# Patient Record
Sex: Female | Born: 1965
Health system: Southern US, Community
[De-identification: ages and names within clinical notes are randomized; demographics above are authoritative.]

## PROBLEM LIST (undated history)

## (undated) DIAGNOSIS — M545 Low back pain, unspecified: Secondary | ICD-10-CM

## (undated) DIAGNOSIS — I509 Heart failure, unspecified: Secondary | ICD-10-CM

## (undated) DIAGNOSIS — I10 Essential (primary) hypertension: Secondary | ICD-10-CM

## (undated) DIAGNOSIS — G4730 Sleep apnea, unspecified: Secondary | ICD-10-CM

## (undated) DIAGNOSIS — K219 Gastro-esophageal reflux disease without esophagitis: Secondary | ICD-10-CM

## (undated) DIAGNOSIS — M1990 Unspecified osteoarthritis, unspecified site: Secondary | ICD-10-CM

## (undated) DIAGNOSIS — E7800 Pure hypercholesterolemia, unspecified: Secondary | ICD-10-CM

## (undated) DIAGNOSIS — E1140 Type 2 diabetes mellitus with diabetic neuropathy, unspecified: Secondary | ICD-10-CM

## (undated) DIAGNOSIS — I429 Cardiomyopathy, unspecified: Secondary | ICD-10-CM

## (undated) DIAGNOSIS — G8929 Other chronic pain: Secondary | ICD-10-CM

## (undated) DIAGNOSIS — E119 Type 2 diabetes mellitus without complications: Secondary | ICD-10-CM

## (undated) DIAGNOSIS — Z8489 Family history of other specified conditions: Secondary | ICD-10-CM

## (undated) HISTORY — PX: JOINT REPLACEMENT: SHX530

## (undated) HISTORY — PX: CARPAL TUNNEL RELEASE: SHX101

## (undated) HISTORY — PX: TOENAIL EXCISION: SUR558

## (undated) HISTORY — PX: BACK SURGERY: SHX140

## (undated) HISTORY — PX: REPLACEMENT TOTAL KNEE BILATERAL: SUR1225

## (undated) HISTORY — PX: TONSILLECTOMY: SUR1361

---

## 1997-09-15 HISTORY — PX: LAPAROSCOPIC CHOLECYSTECTOMY: SUR755

## 1998-02-23 ENCOUNTER — Encounter: Admission: RE | Admit: 1998-02-23 | Discharge: 1998-02-23 | Payer: Self-pay | Admitting: Obstetrics & Gynecology

## 1998-05-24 ENCOUNTER — Encounter: Admission: RE | Admit: 1998-05-24 | Discharge: 1998-05-24 | Payer: Self-pay | Admitting: Obstetrics

## 1998-08-30 ENCOUNTER — Encounter: Admission: RE | Admit: 1998-08-30 | Discharge: 1998-08-30 | Payer: Self-pay | Admitting: Obstetrics

## 1998-11-16 ENCOUNTER — Encounter: Admission: RE | Admit: 1998-11-16 | Discharge: 1998-11-16 | Payer: Self-pay | Admitting: Obstetrics & Gynecology

## 1999-02-14 ENCOUNTER — Encounter: Admission: RE | Admit: 1999-02-14 | Discharge: 1999-02-14 | Payer: Self-pay | Admitting: Obstetrics

## 1999-04-18 ENCOUNTER — Encounter: Admission: RE | Admit: 1999-04-18 | Discharge: 1999-04-18 | Payer: Self-pay | Admitting: Obstetrics

## 1999-05-09 ENCOUNTER — Encounter: Admission: RE | Admit: 1999-05-09 | Discharge: 1999-05-09 | Payer: Self-pay | Admitting: Obstetrics

## 1999-05-16 ENCOUNTER — Ambulatory Visit (HOSPITAL_BASED_OUTPATIENT_CLINIC_OR_DEPARTMENT_OTHER): Admission: RE | Admit: 1999-05-16 | Discharge: 1999-05-16 | Payer: Self-pay | Admitting: Orthopedic Surgery

## 1999-08-01 ENCOUNTER — Encounter: Admission: RE | Admit: 1999-08-01 | Discharge: 1999-08-01 | Payer: Self-pay | Admitting: Obstetrics

## 1999-10-10 ENCOUNTER — Encounter: Admission: RE | Admit: 1999-10-10 | Discharge: 1999-10-10 | Payer: Self-pay | Admitting: Nephrology

## 1999-10-10 ENCOUNTER — Encounter: Payer: Self-pay | Admitting: Nephrology

## 1999-10-16 ENCOUNTER — Encounter: Admission: RE | Admit: 1999-10-16 | Discharge: 1999-10-16 | Payer: Self-pay | Admitting: Family Medicine

## 1999-10-23 ENCOUNTER — Encounter: Admission: RE | Admit: 1999-10-23 | Discharge: 2000-01-21 | Payer: Self-pay | Admitting: *Deleted

## 1999-10-31 ENCOUNTER — Encounter: Admission: RE | Admit: 1999-10-31 | Discharge: 1999-10-31 | Payer: Self-pay | Admitting: Obstetrics

## 2000-02-20 ENCOUNTER — Encounter: Admission: RE | Admit: 2000-02-20 | Discharge: 2000-02-20 | Payer: Self-pay | Admitting: Internal Medicine

## 2000-03-05 ENCOUNTER — Encounter: Admission: RE | Admit: 2000-03-05 | Discharge: 2000-03-05 | Payer: Self-pay | Admitting: Obstetrics

## 2000-05-28 ENCOUNTER — Encounter: Admission: RE | Admit: 2000-05-28 | Discharge: 2000-05-28 | Payer: Self-pay | Admitting: Obstetrics

## 2000-08-27 ENCOUNTER — Encounter: Admission: RE | Admit: 2000-08-27 | Discharge: 2000-08-27 | Payer: Self-pay | Admitting: Obstetrics

## 2001-04-02 ENCOUNTER — Ambulatory Visit (HOSPITAL_BASED_OUTPATIENT_CLINIC_OR_DEPARTMENT_OTHER): Admission: RE | Admit: 2001-04-02 | Discharge: 2001-04-02 | Payer: Self-pay | Admitting: Family Medicine

## 2001-07-13 ENCOUNTER — Ambulatory Visit (HOSPITAL_COMMUNITY): Admission: RE | Admit: 2001-07-13 | Discharge: 2001-07-13 | Payer: Self-pay | Admitting: Family Medicine

## 2001-07-13 ENCOUNTER — Encounter: Payer: Self-pay | Admitting: Family Medicine

## 2002-01-13 ENCOUNTER — Encounter (INDEPENDENT_AMBULATORY_CARE_PROVIDER_SITE_OTHER): Payer: Self-pay | Admitting: *Deleted

## 2002-01-19 ENCOUNTER — Other Ambulatory Visit: Admission: RE | Admit: 2002-01-19 | Discharge: 2002-01-19 | Payer: Self-pay | Admitting: Family Medicine

## 2002-01-19 ENCOUNTER — Encounter: Admission: RE | Admit: 2002-01-19 | Discharge: 2002-01-19 | Payer: Self-pay | Admitting: Family Medicine

## 2002-01-27 ENCOUNTER — Encounter: Admission: RE | Admit: 2002-01-27 | Discharge: 2002-01-27 | Payer: Self-pay | Admitting: Family Medicine

## 2002-02-01 ENCOUNTER — Encounter: Admission: RE | Admit: 2002-02-01 | Discharge: 2002-02-01 | Payer: Self-pay | Admitting: Family Medicine

## 2002-02-23 ENCOUNTER — Encounter: Admission: RE | Admit: 2002-02-23 | Discharge: 2002-02-23 | Payer: Self-pay | Admitting: Family Medicine

## 2002-04-27 ENCOUNTER — Encounter: Admission: RE | Admit: 2002-04-27 | Discharge: 2002-04-27 | Payer: Self-pay | Admitting: Family Medicine

## 2002-05-05 ENCOUNTER — Encounter: Admission: RE | Admit: 2002-05-05 | Discharge: 2002-05-05 | Payer: Self-pay | Admitting: Family Medicine

## 2002-05-05 ENCOUNTER — Ambulatory Visit (HOSPITAL_COMMUNITY): Admission: RE | Admit: 2002-05-05 | Discharge: 2002-05-05 | Payer: Self-pay | Admitting: Family Medicine

## 2002-05-12 ENCOUNTER — Encounter: Admission: RE | Admit: 2002-05-12 | Discharge: 2002-05-12 | Payer: Self-pay | Admitting: Family Medicine

## 2002-06-09 ENCOUNTER — Encounter: Admission: RE | Admit: 2002-06-09 | Discharge: 2002-06-09 | Payer: Self-pay | Admitting: Family Medicine

## 2002-06-10 ENCOUNTER — Ambulatory Visit (HOSPITAL_COMMUNITY): Admission: RE | Admit: 2002-06-10 | Discharge: 2002-06-10 | Payer: Self-pay | Admitting: *Deleted

## 2002-07-04 ENCOUNTER — Encounter: Admission: RE | Admit: 2002-07-04 | Discharge: 2002-10-02 | Payer: Self-pay | Admitting: Sports Medicine

## 2002-07-21 ENCOUNTER — Encounter: Admission: RE | Admit: 2002-07-21 | Discharge: 2002-07-21 | Payer: Self-pay | Admitting: Family Medicine

## 2002-08-16 ENCOUNTER — Encounter: Admission: RE | Admit: 2002-08-16 | Discharge: 2002-08-16 | Payer: Self-pay | Admitting: Family Medicine

## 2002-09-26 ENCOUNTER — Encounter: Admission: RE | Admit: 2002-09-26 | Discharge: 2002-09-26 | Payer: Self-pay | Admitting: Family Medicine

## 2002-10-19 ENCOUNTER — Encounter: Admission: RE | Admit: 2002-10-19 | Discharge: 2002-10-19 | Payer: Self-pay | Admitting: Family Medicine

## 2003-01-12 ENCOUNTER — Encounter: Admission: RE | Admit: 2003-01-12 | Discharge: 2003-01-12 | Payer: Self-pay | Admitting: Family Medicine

## 2003-02-10 ENCOUNTER — Encounter: Admission: RE | Admit: 2003-02-10 | Discharge: 2003-02-10 | Payer: Self-pay | Admitting: Family Medicine

## 2003-04-10 ENCOUNTER — Encounter: Admission: RE | Admit: 2003-04-10 | Discharge: 2003-04-10 | Payer: Self-pay | Admitting: Family Medicine

## 2003-05-01 ENCOUNTER — Encounter: Admission: RE | Admit: 2003-05-01 | Discharge: 2003-05-01 | Payer: Self-pay | Admitting: Sports Medicine

## 2003-06-01 ENCOUNTER — Encounter: Admission: RE | Admit: 2003-06-01 | Discharge: 2003-06-01 | Payer: Self-pay | Admitting: Family Medicine

## 2003-06-29 ENCOUNTER — Encounter: Admission: RE | Admit: 2003-06-29 | Discharge: 2003-06-29 | Payer: Self-pay | Admitting: Family Medicine

## 2004-01-23 ENCOUNTER — Encounter: Admission: RE | Admit: 2004-01-23 | Discharge: 2004-01-23 | Payer: Self-pay | Admitting: Family Medicine

## 2004-02-06 ENCOUNTER — Encounter: Admission: RE | Admit: 2004-02-06 | Discharge: 2004-02-06 | Payer: Self-pay | Admitting: Sports Medicine

## 2004-02-16 ENCOUNTER — Encounter: Admission: RE | Admit: 2004-02-16 | Discharge: 2004-02-16 | Payer: Self-pay | Admitting: Family Medicine

## 2004-03-04 ENCOUNTER — Ambulatory Visit (HOSPITAL_BASED_OUTPATIENT_CLINIC_OR_DEPARTMENT_OTHER): Admission: RE | Admit: 2004-03-04 | Discharge: 2004-03-04 | Payer: Self-pay | Admitting: Sports Medicine

## 2004-03-08 ENCOUNTER — Encounter: Admission: RE | Admit: 2004-03-08 | Discharge: 2004-03-08 | Payer: Self-pay | Admitting: Cardiology

## 2004-05-23 ENCOUNTER — Ambulatory Visit: Payer: Self-pay | Admitting: Family Medicine

## 2004-05-30 ENCOUNTER — Ambulatory Visit: Payer: Self-pay | Admitting: Family Medicine

## 2004-09-12 ENCOUNTER — Ambulatory Visit: Payer: Self-pay | Admitting: Family Medicine

## 2005-01-15 ENCOUNTER — Ambulatory Visit: Payer: Self-pay | Admitting: Family Medicine

## 2005-01-29 ENCOUNTER — Ambulatory Visit: Payer: Self-pay | Admitting: Family Medicine

## 2005-02-25 ENCOUNTER — Encounter
Admission: RE | Admit: 2005-02-25 | Discharge: 2005-05-26 | Payer: Self-pay | Admitting: Physical Medicine and Rehabilitation

## 2005-02-25 ENCOUNTER — Ambulatory Visit: Payer: Self-pay | Admitting: Physical Medicine and Rehabilitation

## 2005-03-06 ENCOUNTER — Encounter
Admission: RE | Admit: 2005-03-06 | Discharge: 2005-03-06 | Payer: Self-pay | Admitting: Physical Medicine and Rehabilitation

## 2005-06-13 ENCOUNTER — Ambulatory Visit: Payer: Self-pay | Admitting: Family Medicine

## 2005-06-27 ENCOUNTER — Ambulatory Visit: Payer: Self-pay | Admitting: Family Medicine

## 2005-12-30 ENCOUNTER — Emergency Department (HOSPITAL_COMMUNITY): Admission: EM | Admit: 2005-12-30 | Discharge: 2005-12-30 | Payer: Self-pay | Admitting: Family Medicine

## 2006-03-21 ENCOUNTER — Emergency Department (HOSPITAL_COMMUNITY): Admission: EM | Admit: 2006-03-21 | Discharge: 2006-03-21 | Payer: Self-pay | Admitting: Emergency Medicine

## 2006-10-05 ENCOUNTER — Encounter: Admission: RE | Admit: 2006-10-05 | Discharge: 2006-10-05 | Payer: Self-pay | Admitting: Family Medicine

## 2006-11-12 DIAGNOSIS — I10 Essential (primary) hypertension: Secondary | ICD-10-CM

## 2006-11-12 DIAGNOSIS — M1710 Unilateral primary osteoarthritis, unspecified knee: Secondary | ICD-10-CM

## 2006-11-12 DIAGNOSIS — G4730 Sleep apnea, unspecified: Secondary | ICD-10-CM | POA: Insufficient documentation

## 2006-11-12 DIAGNOSIS — F17200 Nicotine dependence, unspecified, uncomplicated: Secondary | ICD-10-CM

## 2006-11-12 DIAGNOSIS — F329 Major depressive disorder, single episode, unspecified: Secondary | ICD-10-CM

## 2006-11-12 DIAGNOSIS — L708 Other acne: Secondary | ICD-10-CM | POA: Insufficient documentation

## 2006-11-12 DIAGNOSIS — IMO0002 Reserved for concepts with insufficient information to code with codable children: Secondary | ICD-10-CM | POA: Insufficient documentation

## 2006-11-12 DIAGNOSIS — R079 Chest pain, unspecified: Secondary | ICD-10-CM

## 2006-11-12 DIAGNOSIS — K219 Gastro-esophageal reflux disease without esophagitis: Secondary | ICD-10-CM

## 2006-11-12 DIAGNOSIS — G4733 Obstructive sleep apnea (adult) (pediatric): Secondary | ICD-10-CM | POA: Insufficient documentation

## 2006-11-13 ENCOUNTER — Encounter (INDEPENDENT_AMBULATORY_CARE_PROVIDER_SITE_OTHER): Payer: Self-pay | Admitting: *Deleted

## 2007-01-18 ENCOUNTER — Encounter
Admission: RE | Admit: 2007-01-18 | Discharge: 2007-04-18 | Payer: Self-pay | Admitting: Physical Medicine and Rehabilitation

## 2007-02-26 ENCOUNTER — Ambulatory Visit: Payer: Self-pay | Admitting: Physical Medicine and Rehabilitation

## 2007-03-03 ENCOUNTER — Ambulatory Visit (HOSPITAL_COMMUNITY)
Admission: RE | Admit: 2007-03-03 | Discharge: 2007-03-03 | Payer: Self-pay | Admitting: Physical Medicine and Rehabilitation

## 2007-03-29 ENCOUNTER — Ambulatory Visit: Payer: Self-pay | Admitting: Physical Medicine and Rehabilitation

## 2007-04-28 ENCOUNTER — Ambulatory Visit: Payer: Self-pay | Admitting: Physical Medicine and Rehabilitation

## 2007-04-28 ENCOUNTER — Encounter
Admission: RE | Admit: 2007-04-28 | Discharge: 2007-07-27 | Payer: Self-pay | Admitting: Physical Medicine and Rehabilitation

## 2007-05-24 ENCOUNTER — Ambulatory Visit (HOSPITAL_COMMUNITY)
Admission: RE | Admit: 2007-05-24 | Discharge: 2007-05-24 | Payer: Self-pay | Admitting: Physical Medicine and Rehabilitation

## 2007-06-21 ENCOUNTER — Ambulatory Visit (HOSPITAL_COMMUNITY)
Admission: RE | Admit: 2007-06-21 | Discharge: 2007-06-21 | Payer: Self-pay | Admitting: Physical Medicine and Rehabilitation

## 2007-06-22 ENCOUNTER — Ambulatory Visit: Payer: Self-pay | Admitting: Physical Medicine and Rehabilitation

## 2007-07-23 ENCOUNTER — Ambulatory Visit: Payer: Self-pay | Admitting: Physical Medicine and Rehabilitation

## 2007-10-20 ENCOUNTER — Inpatient Hospital Stay (HOSPITAL_COMMUNITY): Admission: RE | Admit: 2007-10-20 | Discharge: 2007-10-25 | Payer: Self-pay | Admitting: Orthopedic Surgery

## 2008-04-13 ENCOUNTER — Inpatient Hospital Stay (HOSPITAL_COMMUNITY): Admission: RE | Admit: 2008-04-13 | Discharge: 2008-04-22 | Payer: Self-pay | Admitting: Orthopedic Surgery

## 2008-06-13 ENCOUNTER — Encounter: Admission: RE | Admit: 2008-06-13 | Discharge: 2008-06-13 | Payer: Self-pay | Admitting: Orthopedic Surgery

## 2008-08-23 ENCOUNTER — Emergency Department (HOSPITAL_COMMUNITY): Admission: EM | Admit: 2008-08-23 | Discharge: 2008-08-23 | Payer: Self-pay | Admitting: Emergency Medicine

## 2008-10-01 ENCOUNTER — Observation Stay (HOSPITAL_COMMUNITY): Admission: EM | Admit: 2008-10-01 | Discharge: 2008-10-03 | Payer: Self-pay | Admitting: Emergency Medicine

## 2008-10-01 ENCOUNTER — Ambulatory Visit: Payer: Self-pay | Admitting: Cardiology

## 2010-01-01 ENCOUNTER — Emergency Department (HOSPITAL_COMMUNITY): Admission: EM | Admit: 2010-01-01 | Discharge: 2010-01-01 | Payer: Self-pay | Admitting: Emergency Medicine

## 2010-02-03 ENCOUNTER — Encounter: Admission: RE | Admit: 2010-02-03 | Discharge: 2010-02-03 | Payer: Self-pay | Admitting: Neurosurgery

## 2010-02-20 ENCOUNTER — Emergency Department (HOSPITAL_COMMUNITY): Admission: EM | Admit: 2010-02-20 | Discharge: 2010-02-21 | Payer: Self-pay | Admitting: Emergency Medicine

## 2010-03-03 ENCOUNTER — Ambulatory Visit: Payer: Self-pay | Admitting: Pulmonary Disease

## 2010-03-03 ENCOUNTER — Inpatient Hospital Stay (HOSPITAL_COMMUNITY): Admission: EM | Admit: 2010-03-03 | Discharge: 2010-03-16 | Payer: Self-pay | Admitting: Emergency Medicine

## 2010-03-06 ENCOUNTER — Encounter: Payer: Self-pay | Admitting: Neurosurgery

## 2010-03-06 HISTORY — PX: ANTERIOR CERVICAL DECOMP/DISCECTOMY FUSION: SHX1161

## 2010-04-10 ENCOUNTER — Ambulatory Visit (HOSPITAL_COMMUNITY)
Admission: RE | Admit: 2010-04-10 | Discharge: 2010-04-11 | Payer: Self-pay | Source: Home / Self Care | Admitting: Neurosurgery

## 2010-04-10 HISTORY — PX: HARDWARE REVISION: SHX5845

## 2010-08-22 ENCOUNTER — Encounter (HOSPITAL_BASED_OUTPATIENT_CLINIC_OR_DEPARTMENT_OTHER): Admission: RE | Admit: 2010-08-22 | Payer: Self-pay | Admitting: Internal Medicine

## 2010-09-15 HISTORY — PX: LUMBAR LAMINECTOMY/DECOMPRESSION MICRODISCECTOMY: SHX5026

## 2010-10-04 ENCOUNTER — Inpatient Hospital Stay (HOSPITAL_COMMUNITY)
Admission: RE | Admit: 2010-10-04 | Discharge: 2010-10-10 | Payer: Self-pay | Source: Home / Self Care | Attending: Neurosurgery | Admitting: Neurosurgery

## 2010-10-05 NOTE — Op Note (Signed)
NAMEMARKELL, NASEEM NO.:  [id number]  MEDICAL RECORD NO.:  [id number]          PATIENT TYPE:  INP  LOCATION:  3111                         FACILITY:  [facility]  PHYSICIAN:  Ashok Pall, M.D.     DATE OF BIRTH:  1966/06/13  DATE OF PROCEDURE:  10/04/2010 DATE OF DISCHARGE:                              OPERATIVE REPORT   PREOPERATIVE DIAGNOSES: 1. Lumbar stenosis, right L3-L4, L4-L5. 2. Lumbar displaced disk, left L5-S1.  POSTOPERATIVE DIAGNOSES: 1. Lumbar stenosis, right L3-L4, L4-L5. 2. Lumbar displaced disk, left L5-S1.  PROCEDURES: 1. Right L3-L4 semi-hemilaminectomy and foraminotomy. 2. Decompression of the L3-L4 roots, additional level L4-L5 on the     right side with microdissection. 3. Left L5-S1 semi-hemilaminectomy and diskectomy with     microdissection.  COMPLICATIONS:  None.  SURGEON:  Ashok Pall, MD  ASSISTANT:  Ophelia Charter, MD  ANESTHESIA:  General endotracheal.  INDICATIONS:  Mrs. Blakenship is a woman who is morbidly obese and presented with significant pain in the lower back late spring of last year. However, on my exam, I noticed that she was floridly myelopathic.  That led to her undergoing a cervical corpectomy this summer.  After she improved, she again mentioned that her back and lower extremities were painful.  MRI showed significant foraminal narrowing on the right side at L3-L4 and at L4-L5 and a fairly large displaced disk on the left L5- S1.  We spoke at length about the procedure, expectations, the fact that this was not going to cure her of her pain, and the fact that her morbid obesity would make things somewhat more difficult.  She understood and still wished to proceed.  OPERATIVE NOTE:  [name] was brought to the operating room, intubated, and placed under general anesthetic.  We initially had a Jackson table secondary to her weight and flipped her onto the [place] table.  She was too wide for the bed.  We  then flipped her back onto the stretcher and then used a wider table which accommodated her without difficulty.  She was positioned and all pressure points were properly padded.  Her back was prepped and she was draped in a sterile fashion.  I infiltrated 30 mL of lidocaine with 1:200,000 strength epinephrine.  I opened the skin with a #10 blade and took this down to the thoracolumbar fascia.  On the right side, I then exposed what were confirmed to be the laminae of L3, L4, and L5.  I performed semi-hemilaminectomies using the drill and Kerrison punch on the right side at L3-L4.  There was frankly significant overgrowth of the facet and also fairly impressive calcification and hardening of the ligamentum flavum.  I removed the ligament and was able to decompress the spinal canal and nerve roots of L3-L4 on the right side.  I did perform a medial facetectomy of the joint, it was in a horrible shape and actually believed that she had a synovial cyst which was somewhat adherent to the dura that I removed.  I then turned my attention to the L4-L5 level and again semi- hemilaminectomy of L4 and L5.  Again,  I encountered the same very hard ligamentum flavum.  I removed that, went on into the neural foramen to decompress the L5.  L4 was decompressed both above and below and the thecal sac was well decompressed and the interlaminar space between L4- L5.  I placed Gelfoam along both resection sites and went to the left side at L5-S1 exposing the interlaminar space.  I removed ligamentum flavum, exposed the thecal sac, and brought the microscope into the operative field again for microdissection.  With Dr. Arnoldo Morale assistance, we removed what was a large disk herniation at L5-S1 on the left.  This was markedly degenerated and came out in a piecemeal fashion.  I did thoroughly decompressed the S1 root and L5 root that was felt to be decompressed also.  I irrigated the wound.  I then closed the wound  in layered fashion with Dr. Arnoldo Morale assistance.  I reapproximated the thoracolumbar subcutaneous and subcuticular layers.  Dermabond was used for sterile dressing.          ______________________________ Ashok Pall, M.D.     KC/MEDQ  D:  10/04/2010  T:  10/05/2010  Job:  [id number]  Electronically Signed by Ashok Pall M.D. on 10/05/2010 10:49:08 AM

## 2010-10-06 ENCOUNTER — Encounter: Payer: Self-pay | Admitting: Orthopedic Surgery

## 2010-10-06 ENCOUNTER — Encounter: Payer: Self-pay | Admitting: Obstetrics and Gynecology

## 2010-10-06 ENCOUNTER — Encounter: Payer: Self-pay | Admitting: Cardiology

## 2010-10-07 LAB — GLUCOSE, CAPILLARY
Glucose-Capillary: 239 mg/dL — ABNORMAL HIGH (ref 70–99)
Glucose-Capillary: 258 mg/dL — ABNORMAL HIGH (ref 70–99)

## 2010-10-07 LAB — URINALYSIS, ROUTINE W REFLEX MICROSCOPIC
Hgb urine dipstick: NEGATIVE
Ketones, ur: 15 mg/dL — AB
Protein, ur: 30 mg/dL — AB
Urobilinogen, UA: 1 mg/dL (ref 0.0–1.0)

## 2010-10-07 LAB — URINE MICROSCOPIC-ADD ON

## 2010-10-07 LAB — CBC
HCT: 38.4 % (ref 36.0–46.0)
MCH: 27.4 pg (ref 26.0–34.0)
MCHC: 31.8 g/dL (ref 30.0–36.0)
MCV: 86.1 fL (ref 78.0–100.0)
RDW: 16.1 % — ABNORMAL HIGH (ref 11.5–15.5)

## 2010-10-07 LAB — SURGICAL PCR SCREEN
MRSA, PCR: NEGATIVE
Staphylococcus aureus: POSITIVE — AB

## 2010-10-09 LAB — POCT I-STAT, CHEM 8
Chloride: 100 mEq/L (ref 96–112)
Glucose, Bld: 234 mg/dL — ABNORMAL HIGH (ref 70–99)
HCT: 46 % (ref 36.0–46.0)
Potassium: 5.2 mEq/L — ABNORMAL HIGH (ref 3.5–5.1)

## 2010-11-08 NOTE — Discharge Summary (Signed)
  [name], [name] NO.:  [id number]  MEDICAL RECORD NO.:  [id number]          PATIENT TYPE:  INP  LOCATION:  3021                         FACILITY:  [facility]  PHYSICIAN:  Ashok Pall, M.D.     DATE OF BIRTH:  Feb 12, 1966  DATE OF ADMISSION:  10/04/2010 DATE OF DISCHARGE:  10/10/2010                              DISCHARGE SUMMARY   ADMITTING DIAGNOSES:  Lumbar stenosis at L3-4, L4-5, displaced disk at L5-S1, left.  DISCHARGE DIAGNOSES:  Lumbar stenosis at right L3-4, right L4-5, displaced disk at left L5-S1.  PROCEDURES:  Right L3-4 semihemilaminectomy and foraminotomy, decompression of the L3, L4, and L5 roots on the right side, diskectomy L5-S1 on the left.  COMPLICATIONS:  None.  DISCHARGE STATUS:  Alive and well.  Discharge medications will include Opana and oxycodone.  INDICATIONS:  [name] is a long-term patient of mine who has significant stenosis, especially on the right side at L3-4 and at L4-5 and a fairly large herniated disk on the left side at L5-S1.  I offered and she agreed to undergo operative decompression.  She was taken to the operating room on her admission day and underwent an uncomplicated procedure.  Postop, she progressed well.  She is morbidly obese weighing 179.17 kg.  She has done well and will need a walker at discharge and has had physical therapy.  Wound is clean, dry, no signs of infection. She will be discharged with Opana and oxycodone 30 mg IR.  I will see her in the office in 3-4 weeks.          ______________________________ Ashok Pall, M.D.     KC/MEDQ  D:  10/09/2010  T:  10/10/2010  Job:  [id number]  Electronically Signed by Ashok Pall M.D. on 11/08/2010 09:23:30 AM

## 2010-11-30 LAB — SURGICAL PCR SCREEN
MRSA, PCR: NEGATIVE
Staphylococcus aureus: NEGATIVE

## 2010-11-30 LAB — PROTIME-INR: Prothrombin Time: 13.6 seconds (ref 11.6–15.2)

## 2010-11-30 LAB — BASIC METABOLIC PANEL
BUN: 4 mg/dL — ABNORMAL LOW (ref 6–23)
Creatinine, Ser: 0.54 mg/dL (ref 0.4–1.2)
GFR calc non Af Amer: >60 mL/min (ref >60)
Potassium: 3.9 mEq/L (ref 3.5–5.1)

## 2010-11-30 LAB — CBC
HCT: 33 % — ABNORMAL LOW (ref 36.0–46.0)
Platelets: 273 10*3/uL (ref 150–400)
RDW: 16.2 % — ABNORMAL HIGH (ref 11.5–15.5)
WBC: 11.1 10*3/uL — ABNORMAL HIGH (ref 4.0–10.5)

## 2010-11-30 LAB — APTT: aPTT: 29 seconds (ref 24–37)

## 2010-11-30 LAB — DIFFERENTIAL
Basophils Absolute: 0 10*3/uL (ref 0.0–0.1)
Lymphocytes Relative: 31 % (ref 12–46)
Neutro Abs: 6.5 10*3/uL (ref 1.7–7.7)
Neutrophils Relative %: 59 % (ref 43–77)

## 2010-11-30 LAB — GLUCOSE, CAPILLARY
Glucose-Capillary: 131 mg/dL — ABNORMAL HIGH (ref 70–99)
Glucose-Capillary: 315 mg/dL — ABNORMAL HIGH (ref 70–99)

## 2010-12-01 LAB — GLUCOSE, CAPILLARY
Glucose-Capillary: 194 mg/dL — ABNORMAL HIGH (ref 70–99)
Glucose-Capillary: 221 mg/dL — ABNORMAL HIGH (ref 70–99)
Glucose-Capillary: 253 mg/dL — ABNORMAL HIGH (ref 70–99)
Glucose-Capillary: 279 mg/dL — ABNORMAL HIGH (ref 70–99)
Glucose-Capillary: 314 mg/dL — ABNORMAL HIGH (ref 70–99)
Glucose-Capillary: 105 mg/dL — ABNORMAL HIGH (ref 70–99)
Glucose-Capillary: 105 mg/dL — ABNORMAL HIGH (ref 70–99)
Glucose-Capillary: 108 mg/dL — ABNORMAL HIGH (ref 70–99)
Glucose-Capillary: 109 mg/dL — ABNORMAL HIGH (ref 70–99)
Glucose-Capillary: 114 mg/dL — ABNORMAL HIGH (ref 70–99)
Glucose-Capillary: 114 mg/dL — ABNORMAL HIGH (ref 70–99)
Glucose-Capillary: 115 mg/dL — ABNORMAL HIGH (ref 70–99)
Glucose-Capillary: 118 mg/dL — ABNORMAL HIGH (ref 70–99)
Glucose-Capillary: 121 mg/dL — ABNORMAL HIGH (ref 70–99)
Glucose-Capillary: 122 mg/dL — ABNORMAL HIGH (ref 70–99)
Glucose-Capillary: 127 mg/dL — ABNORMAL HIGH (ref 70–99)
Glucose-Capillary: 132 mg/dL — ABNORMAL HIGH (ref 70–99)
Glucose-Capillary: 135 mg/dL — ABNORMAL HIGH (ref 70–99)
Glucose-Capillary: 142 mg/dL — ABNORMAL HIGH (ref 70–99)
Glucose-Capillary: 144 mg/dL — ABNORMAL HIGH (ref 70–99)
Glucose-Capillary: 144 mg/dL — ABNORMAL HIGH (ref 70–99)
Glucose-Capillary: 151 mg/dL — ABNORMAL HIGH (ref 70–99)
Glucose-Capillary: 152 mg/dL — ABNORMAL HIGH (ref 70–99)
Glucose-Capillary: 155 mg/dL — ABNORMAL HIGH (ref 70–99)
Glucose-Capillary: 160 mg/dL — ABNORMAL HIGH (ref 70–99)
Glucose-Capillary: 163 mg/dL — ABNORMAL HIGH (ref 70–99)
Glucose-Capillary: 163 mg/dL — ABNORMAL HIGH (ref 70–99)
Glucose-Capillary: 164 mg/dL — ABNORMAL HIGH (ref 70–99)
Glucose-Capillary: 164 mg/dL — ABNORMAL HIGH (ref 70–99)
Glucose-Capillary: 164 mg/dL — ABNORMAL HIGH (ref 70–99)
Glucose-Capillary: 165 mg/dL — ABNORMAL HIGH (ref 70–99)
Glucose-Capillary: 169 mg/dL — ABNORMAL HIGH (ref 70–99)
Glucose-Capillary: 173 mg/dL — ABNORMAL HIGH (ref 70–99)
Glucose-Capillary: 174 mg/dL — ABNORMAL HIGH (ref 70–99)
Glucose-Capillary: 178 mg/dL — ABNORMAL HIGH (ref 70–99)
Glucose-Capillary: 179 mg/dL — ABNORMAL HIGH (ref 70–99)
Glucose-Capillary: 180 mg/dL — ABNORMAL HIGH (ref 70–99)
Glucose-Capillary: 184 mg/dL — ABNORMAL HIGH (ref 70–99)
Glucose-Capillary: 184 mg/dL — ABNORMAL HIGH (ref 70–99)
Glucose-Capillary: 186 mg/dL — ABNORMAL HIGH (ref 70–99)
Glucose-Capillary: 190 mg/dL — ABNORMAL HIGH (ref 70–99)
Glucose-Capillary: 196 mg/dL — ABNORMAL HIGH (ref 70–99)
Glucose-Capillary: 197 mg/dL — ABNORMAL HIGH (ref 70–99)
Glucose-Capillary: 202 mg/dL — ABNORMAL HIGH (ref 70–99)
Glucose-Capillary: 205 mg/dL — ABNORMAL HIGH (ref 70–99)
Glucose-Capillary: 205 mg/dL — ABNORMAL HIGH (ref 70–99)
Glucose-Capillary: 212 mg/dL — ABNORMAL HIGH (ref 70–99)
Glucose-Capillary: 225 mg/dL — ABNORMAL HIGH (ref 70–99)
Glucose-Capillary: 238 mg/dL — ABNORMAL HIGH (ref 70–99)
Glucose-Capillary: 243 mg/dL — ABNORMAL HIGH (ref 70–99)
Glucose-Capillary: 244 mg/dL — ABNORMAL HIGH (ref 70–99)
Glucose-Capillary: 248 mg/dL — ABNORMAL HIGH (ref 70–99)
Glucose-Capillary: 252 mg/dL — ABNORMAL HIGH (ref 70–99)
Glucose-Capillary: 254 mg/dL — ABNORMAL HIGH (ref 70–99)
Glucose-Capillary: 255 mg/dL — ABNORMAL HIGH (ref 70–99)
Glucose-Capillary: 256 mg/dL — ABNORMAL HIGH (ref 70–99)
Glucose-Capillary: 259 mg/dL — ABNORMAL HIGH (ref 70–99)
Glucose-Capillary: 264 mg/dL — ABNORMAL HIGH (ref 70–99)
Glucose-Capillary: 268 mg/dL — ABNORMAL HIGH (ref 70–99)
Glucose-Capillary: 329 mg/dL — ABNORMAL HIGH (ref 70–99)
Glucose-Capillary: 377 mg/dL — ABNORMAL HIGH (ref 70–99)
Glucose-Capillary: 384 mg/dL — ABNORMAL HIGH (ref 70–99)
Glucose-Capillary: 403 mg/dL — ABNORMAL HIGH (ref 70–99)
Glucose-Capillary: 423 mg/dL — ABNORMAL HIGH (ref 70–99)
Glucose-Capillary: 440 mg/dL — ABNORMAL HIGH (ref 70–99)
Glucose-Capillary: 99 mg/dL (ref 70–99)

## 2010-12-01 LAB — URINALYSIS, ROUTINE W REFLEX MICROSCOPIC
Bilirubin Urine: NEGATIVE
Glucose, UA: >1000 mg/dL — AB
Ketones, ur: NEGATIVE mg/dL
Protein, ur: NEGATIVE mg/dL
pH: 6 (ref 5.0–8.0)

## 2010-12-01 LAB — HEMOGLOBIN A1C
Hgb A1c MFr Bld: 9.8 % — ABNORMAL HIGH (ref <5.7)
Mean Plasma Glucose: 235 mg/dL — ABNORMAL HIGH (ref <117)

## 2010-12-01 LAB — BLOOD GAS, ARTERIAL
Acid-Base Excess: 1.1 mmol/L (ref 0.0–2.0)
Acid-Base Excess: 3.3 mmol/L — ABNORMAL HIGH (ref 0.0–2.0)
FIO2: 0.35 %
FIO2: 0.7 %
MECHVT: 700 mL
MECHVT: 700 mL
O2 Saturation: 96.6 %
Patient temperature: 98.6
Patient temperature: 98.6
Pressure support: 5 cmH2O
TCO2: 27 mmol/L (ref 0–100)
TCO2: 27.7 mmol/L (ref 0–100)
TCO2: 28.8 mmol/L (ref 0–100)
pCO2 arterial: 38.8 mmHg (ref 35.0–45.0)
pCO2 arterial: 43.9 mmHg (ref 35.0–45.0)
pH, Arterial: 7.449 — ABNORMAL HIGH (ref 7.350–7.400)
pO2, Arterial: 181 mmHg — ABNORMAL HIGH (ref 80.0–100.0)

## 2010-12-01 LAB — COMPREHENSIVE METABOLIC PANEL
ALT: 30 U/L (ref 0–35)
AST: 28 U/L (ref 0–37)
Albumin: 3 g/dL — ABNORMAL LOW (ref 3.5–5.2)
Alkaline Phosphatase: 109 U/L (ref 39–117)
GFR calc Af Amer: >60 mL/min (ref >60)
Potassium: 4.8 mEq/L (ref 3.5–5.1)
Sodium: 135 mEq/L (ref 135–145)
Total Protein: 6.9 g/dL (ref 6.0–8.3)

## 2010-12-01 LAB — CBC
HCT: 38.5 % (ref 36.0–46.0)
HCT: 41.8 % (ref 36.0–46.0)
Hemoglobin: 12.8 g/dL (ref 12.0–15.0)
Hemoglobin: 13.8 g/dL (ref 12.0–15.0)
MCHC: 33.1 g/dL (ref 30.0–36.0)
MCHC: 33.3 g/dL (ref 30.0–36.0)
MCV: 88.9 fL (ref 78.0–100.0)
RBC: 4.33 MIL/uL (ref 3.87–5.11)
RBC: 4.36 MIL/uL (ref 3.87–5.11)
RDW: 16.1 % — ABNORMAL HIGH (ref 11.5–15.5)
WBC: 19.6 10*3/uL — ABNORMAL HIGH (ref 4.0–10.5)
WBC: 20.6 10*3/uL — ABNORMAL HIGH (ref 4.0–10.5)

## 2010-12-01 LAB — TYPE AND SCREEN: Antibody Screen: NEGATIVE

## 2010-12-01 LAB — POCT I-STAT, CHEM 8
Glucose, Bld: 444 mg/dL — ABNORMAL HIGH (ref 70–99)
HCT: 42 % (ref 36.0–46.0)
Hemoglobin: 14.3 g/dL (ref 12.0–15.0)
Potassium: 4.7 mEq/L (ref 3.5–5.1)
Sodium: 135 mEq/L (ref 135–145)

## 2010-12-01 LAB — BASIC METABOLIC PANEL
BUN: 11 mg/dL (ref 6–23)
Calcium: 8.1 mg/dL — ABNORMAL LOW (ref 8.4–10.5)
Chloride: 106 mEq/L (ref 96–112)
GFR calc Af Amer: >60 mL/min (ref >60)
GFR calc non Af Amer: >60 mL/min (ref >60)
Glucose, Bld: 190 mg/dL — ABNORMAL HIGH (ref 70–99)
Potassium: 4.2 mEq/L (ref 3.5–5.1)
Potassium: 4.5 mEq/L (ref 3.5–5.1)
Sodium: 136 mEq/L (ref 135–145)
Sodium: 138 mEq/L (ref 135–145)

## 2010-12-01 LAB — POCT I-STAT 4, (NA,K, GLUC, HGB,HCT)
Glucose, Bld: 167 mg/dL — ABNORMAL HIGH (ref 70–99)
HCT: 43 % (ref 36.0–46.0)
Potassium: 4.2 mEq/L (ref 3.5–5.1)
Sodium: 134 mEq/L — ABNORMAL LOW (ref 135–145)
Sodium: 137 mEq/L (ref 135–145)

## 2010-12-01 LAB — POCT I-STAT GLUCOSE
Glucose, Bld: 188 mg/dL — ABNORMAL HIGH (ref 70–99)
Operator id: 156951

## 2010-12-01 LAB — PROTIME-INR: INR: 1.04 (ref 0.00–1.49)

## 2010-12-01 LAB — ABO/RH: ABO/RH(D): O POS

## 2010-12-01 LAB — URINE MICROSCOPIC-ADD ON

## 2010-12-03 LAB — RAPID URINE DRUG SCREEN, HOSP PERFORMED
Amphetamines: NOT DETECTED
Barbiturates: NOT DETECTED

## 2010-12-30 LAB — LIPASE, BLOOD: Lipase: 36 U/L (ref 11–59)

## 2010-12-30 LAB — RAPID URINE DRUG SCREEN, HOSP PERFORMED
Amphetamines: NOT DETECTED
Benzodiazepines: NOT DETECTED
Cocaine: NOT DETECTED
Opiates: POSITIVE — AB
Tetrahydrocannabinol: NOT DETECTED

## 2010-12-30 LAB — PHOSPHORUS: Phosphorus: 3.2 mg/dL (ref 2.3–4.6)

## 2010-12-30 LAB — URINALYSIS, ROUTINE W REFLEX MICROSCOPIC
Glucose, UA: NEGATIVE mg/dL
Ketones, ur: NEGATIVE mg/dL
Nitrite: NEGATIVE
Protein, ur: NEGATIVE mg/dL

## 2010-12-30 LAB — URINE CULTURE

## 2010-12-30 LAB — DIFFERENTIAL
Basophils Absolute: 0.3 10*3/uL — ABNORMAL HIGH (ref 0.0–0.1)
Basophils Relative: 3 % — ABNORMAL HIGH (ref 0–1)
Eosinophils Absolute: 0.1 10*3/uL (ref 0.0–0.7)
Eosinophils Relative: 1 % (ref 0–5)

## 2010-12-30 LAB — CBC
Hemoglobin: 11.6 g/dL — ABNORMAL LOW (ref 12.0–15.0)
MCV: 81.3 fL (ref 78.0–100.0)
RBC: 4.44 MIL/uL (ref 3.87–5.11)
WBC: 9.8 10*3/uL (ref 4.0–10.5)

## 2010-12-30 LAB — POCT I-STAT, CHEM 8
BUN: 9 mg/dL (ref 6–23)
Calcium, Ion: 1.16 mmol/L (ref 1.12–1.32)
Chloride: 104 mEq/L (ref 96–112)
Creatinine, Ser: 0.7 mg/dL (ref 0.4–1.2)
Sodium: 139 mEq/L (ref 135–145)
TCO2: 24 mmol/L (ref 0–100)

## 2010-12-30 LAB — COMPREHENSIVE METABOLIC PANEL
ALT: 22 U/L (ref 0–35)
AST: 30 U/L (ref 0–37)
CO2: 26 mEq/L (ref 19–32)
Chloride: 105 mEq/L (ref 96–112)
GFR calc Af Amer: >60 mL/min (ref >60)
GFR calc non Af Amer: >60 mL/min (ref >60)
Sodium: 137 mEq/L (ref 135–145)
Total Bilirubin: 0.6 mg/dL (ref 0.3–1.2)

## 2010-12-30 LAB — MAGNESIUM: Magnesium: 2 mg/dL (ref 1.5–2.5)

## 2010-12-30 LAB — LIPID PANEL
Cholesterol: 124 mg/dL (ref 0–200)
LDL Cholesterol: 81 mg/dL (ref 0–99)
Total CHOL/HDL Ratio: 4 RATIO
Triglycerides: 61 mg/dL (ref <150)
VLDL: 12 mg/dL (ref 0–40)

## 2010-12-30 LAB — CULTURE, BLOOD (ROUTINE X 2): Culture: NO GROWTH

## 2010-12-30 LAB — POCT CARDIAC MARKERS
Myoglobin, poc: 51.7 ng/mL (ref 12–200)
Troponin i, poc: <0.05 ng/mL (ref 0.00–0.09)
Troponin i, poc: <0.05 ng/mL (ref 0.00–0.09)

## 2010-12-30 LAB — CK TOTAL AND CKMB (NOT AT ARMC)
CK, MB: 1.4 ng/mL (ref 0.3–4.0)
Total CK: 82 U/L (ref 7–177)

## 2010-12-30 LAB — HEMOGLOBIN A1C
Hgb A1c MFr Bld: 7.1 % — ABNORMAL HIGH (ref 4.6–6.1)
Mean Plasma Glucose: 157 mg/dL

## 2010-12-30 LAB — BASIC METABOLIC PANEL
BUN: 8 mg/dL (ref 6–23)
Chloride: 103 mEq/L (ref 96–112)
Creatinine, Ser: 0.65 mg/dL (ref 0.4–1.2)
GFR calc non Af Amer: >60 mL/min (ref >60)
Glucose, Bld: 139 mg/dL — ABNORMAL HIGH (ref 70–99)
Potassium: 4.2 mEq/L (ref 3.5–5.1)

## 2010-12-30 LAB — BRAIN NATRIURETIC PEPTIDE: Pro B Natriuretic peptide (BNP): <30 pg/mL (ref 0.0–100.0)

## 2010-12-30 LAB — CARDIAC PANEL(CRET KIN+CKTOT+MB+TROPI): Total CK: 59 U/L (ref 7–177)

## 2011-01-01 ENCOUNTER — Emergency Department (HOSPITAL_COMMUNITY)
Admission: EM | Admit: 2011-01-01 | Discharge: 2011-01-01 | Disposition: A | Discharge status: Home / Self Care | Payer: Medicare Other | Attending: Emergency Medicine | Admitting: Emergency Medicine

## 2011-01-01 DIAGNOSIS — X58XXXA Exposure to other specified factors, initial encounter: Secondary | ICD-10-CM | POA: Insufficient documentation

## 2011-01-01 DIAGNOSIS — E119 Type 2 diabetes mellitus without complications: Secondary | ICD-10-CM | POA: Insufficient documentation

## 2011-01-01 DIAGNOSIS — M129 Arthropathy, unspecified: Secondary | ICD-10-CM | POA: Insufficient documentation

## 2011-01-01 DIAGNOSIS — IMO0002 Reserved for concepts with insufficient information to code with codable children: Secondary | ICD-10-CM | POA: Insufficient documentation

## 2011-01-01 DIAGNOSIS — Z79899 Other long term (current) drug therapy: Secondary | ICD-10-CM | POA: Insufficient documentation

## 2011-01-01 DIAGNOSIS — Z794 Long term (current) use of insulin: Secondary | ICD-10-CM | POA: Insufficient documentation

## 2011-01-01 DIAGNOSIS — M79609 Pain in unspecified limb: Secondary | ICD-10-CM | POA: Insufficient documentation

## 2011-01-01 DIAGNOSIS — I872 Venous insufficiency (chronic) (peripheral): Secondary | ICD-10-CM | POA: Insufficient documentation

## 2011-01-01 DIAGNOSIS — R609 Edema, unspecified: Secondary | ICD-10-CM | POA: Insufficient documentation

## 2011-01-01 DIAGNOSIS — K219 Gastro-esophageal reflux disease without esophagitis: Secondary | ICD-10-CM | POA: Insufficient documentation

## 2011-01-01 DIAGNOSIS — M7989 Other specified soft tissue disorders: Secondary | ICD-10-CM | POA: Insufficient documentation

## 2011-01-01 DIAGNOSIS — R209 Unspecified disturbances of skin sensation: Secondary | ICD-10-CM | POA: Insufficient documentation

## 2011-01-28 NOTE — Op Note (Signed)
NAMEADALAYA, [name] NO.:  [id number]   MEDICAL RECORD NO.:  [id number]          PATIENT TYPE:  INP   LOCATION:  5023                         FACILITY:  [facility]   PHYSICIAN:  Newt Minion, MD     DATE OF BIRTH:  [birth year]   DATE OF PROCEDURE:  10/20/2007  DATE OF DISCHARGE:                               OPERATIVE REPORT   PREOPERATIVE DIAGNOSIS:  Osteoarthritis, left knee.   POSTOPERATIVE DIAGNOSIS:  Osteoarthritis, left knee.   PROCEDURE:  Left total knee arthroplasty with DePuy components, #3  tibia, #3 femur, 10-mm posterior-stabilized poly tray with a 32-mm  patella.   SURGEON:  Newt Minion, MD   ASSISTANT:  Epimenio Foot, P.A.   ANESTHESIA:  General.   ESTIMATED BLOOD LOSS:  Minimal.   ANTIBIOTICS:  2 g of Kefzol.   DRAINS:  None.   COMPLICATIONS:  None.   TOURNIQUET TIME:  None.   ESTIMATED BLOOD LOSS:  300 mL.   DISPOSITION:  To PACU in stable condition.   INDICATION FOR PROCEDURE:  The patient is a 45 year old woman with  osteoarthritis of her left knee.  She has failed conservative care, has  pain with activities of daily living, requires narcotic pain medication  due to arthritic pain and presents at this time for a total knee  arthroplasty.  Of note, the patient is morbidly obese a body mass index  greater than 40% with a weight of 179 kg.  The risks and benefits of  surgery were discussed including infection, neurovascular injury,  persistent pain, DVT, failure of the bone, failure of the implants,  nonhealing of the wound, need for additional surgery.  The patient  states she understands and wishes to proceed at this time.   DESCRIPTION OF PROCEDURE:  The patient was brought to OR room #4,  underwent a general anesthetic.  After an adequate level of anesthesia  obtained, the patient's left lower extremity was prepped using DuraPrep,  draped into a sterile field.  An Charlie Pitter was used to cover all exposed  skin.  A midline  incision was made and this was carried down to a medial  parapatellar retinacular incision.  The patella was everted and the  femoral canal was drilled and the IM guide was used.  This was set to 5  degrees of valgus and 11 mm was taken off the distal femur.  The cutting  block was placed and the 11 mm was taken off the distal femur.  The  femur was sized for a size 3 and the size 3 chamfer cutting block was  placed and the chamfer cuts were made for the size 3 femur.  Attention  was then focused on the tibia.  The external alignment guide was placed  with neutral varus-valgus, neutral posterior slope, and 10 mm was taken  off the least-involved medial tibial plateau.  This was sized for a size  3 and the size 3 trial was placed with the keel punch made for a size 3.  The box cut was then made on the femur.  The femoral trial was  placed  with a 10-mm poly tray.  The collaterals were stable and the patient had  full extension and full flexion of the knee with the 10-mm poly tray.  The trial components were removed.  The peg cuts were drilled for the  femur and the patella was then resurfaced with 10 mm taken off the  patella.  This sized for a 32-mm patella and the peg cuts were made for  the 32-mm patella.  The knee was irrigated with pulse lavage.  Hemostasis was maintained throughout the case.  The cement was mixed and  the tibial and femoral components were cemented in place, impacted,  loose cement was removed, and the knee was again irrigated with normal  saline.  The tibial tray was placed.  The knee was left in extension  until the cement had hardened.  The patella was then placed and the  clamp was placed on the patella, and this was also left in place until  the cement had hardened.  The knee was irrigated with normal saline  throughout this process.  The clamp was removed.  The knee was placed  through a full range of motion.  There was no subluxation of the  patella.  The medial  parapatellar retinacular incision was closed using  #1 Vicryl.  The subcu was closed using 2-0 Vicryl.  Skin was closed  using Proximate staples.  The wound was covered Adaptic orthopedic  sponges, ABD dressing, Webril and Coban.  The patient was extubated and  taken to the PACU in stable condition.  The posterior aspect of the  capsule was injected with a total 60 mL of 0.25% Marcaine plain for  local anesthetic.  The patient was discharged to PACU in stable  condition.  Planned for discharge in 3-4 days.      Newt Minion, MD  Electronically Signed     MVD/MEDQ  D:  10/20/2007  T:  10/21/2007  Job:  [id number]

## 2011-01-28 NOTE — Consult Note (Signed)
NAMESILJE, KUHNE NO.:  [id number]   MEDICAL RECORD NO.:  [id number]          PATIENT TYPE:  INP   LOCATION:  [id number]                         FACILITY:  [facility]   PHYSICIAN:  Minus Breeding, MD, FACCDATE OF BIRTH:  1965-11-17   DATE OF CONSULTATION:  10/02/2008  DATE OF DISCHARGE:                                 CONSULTATION   PRIMARY CARDIOLOGIST:  [facility] being seen by Dr.  Percival Spanish.   REQUESTING PHYSICIAN:  Sherryl Manges, MD with [facility].   PATIENT PROFILE:  A 45 year old obese African American female without  prior cardiac history who was admitted for left scapular pain.  We are  asked to evaluate;  1. Left scapular/back pain.  2. Ongoing tobacco abuse, about a 20-pack-year history.  3. Hypertension.  4. Morbid obesity.  5. Bilateral osteoarthritis of the knees, status post bilateral total      knee arthroplasty in 2009.  6. History of left rotator cuff injury.  7. Depression.  8. GERD.  9. Low back pain, which is chronic.  10.History of carpal tunnel syndrome.   HISTORY OF PRESENT ILLNESS:  A 45 year old [place] American female  without prior cardiac history.  She was in usual state of health for  approximately 10 years ago when she began to experience intermittent  left scapular discomfort without associated symptoms occurring  predominately at rest, worse with lying on her back and also very tender  when symptoms are present.  She has no associated symptoms with no  exertional symptoms.  Symptoms typically last 15-20 minutes and resolve  with an antacids such as Tums, Rolaids, baking soda and water as well as  belching.  She has walked approximately 30 minutes most days over the  past week without symptoms.  She had worsening symptoms while she was in  church on October 01, 2008, again at rest and she presented to the [facility] ED.  She did take Rolaids on the way and in the ED, she was given  sublingual nitroglycerin with  questionable relief.  Since admission, she  has had no further chest discomfort and her enzymes have remained  negative.  ECG shows no acute changes.   ALLERGIES:  No known drug allergies.   CURRENT MEDICATIONS:  1. Aspirin 81 mg daily.  2. Enoxaparin 40 mg daily.  3. Lexapro 20 mg daily.  4. Hydrochlorothiazide 25 mg daily.  5. Relafen 750 mg b.i.d.  6. Protonix 40 mg b.i.d.  7. Topamax 100 mg daily.   FAMILY HISTORY:  Mother is alive at age 32 with diabetes.  Father is age  75 and alive and well.  She has two brothers and two sisters, both are  alive and well.   SOCIAL HISTORY:  She lives in [place] with her children and their  father.  She is on disability, status post bilateral knee replacement.  She is about a 20-pack-year history of tobacco abuse, currently smoking  one pack a day.  She denies alcohol or drug use.  She tries to walk 30  minutes most days of the week and has done this without  limitations.   REVIEW OF SYSTEMS:  Positive for chest pain, anxiety, constipation of  about 10 days.  She has also had GERD symptoms responsive to antacids.  She is a full code, otherwise all systems reviewed and negative.   PHYSICAL EXAMINATION:  VITAL SIGNS:  Temperature 98.7, heart rate 90,  respirations 20, blood pressure 140/84, pulse ox 100% on room air.  GENERAL:  Pleasant African female in no acute distress.  She is obese.  HEENT:  Normal.  PSYCHIATRY:  Normal affect.  NEUROLOGIC:  Grossly intact and nonfocal.  SKIN:  Warm and dry without lesions or masses.  MUSCULOSKELETAL:  Grossly normal without deformity or effusions.  NECK:  Obese and difficult to assess JVP.  She does have radiation of a  cardiac murmur to her carotids, greater on the left.  LUNGS:  Respirations are regular and unlabored.  Clear to auscultation.  CARDIAC:  Regular S1 and S2.  She has a 2/6 systolic murmur heard at  best the right upper sternal border and radiating up to the neck and  down to the  left lower sternal border.  ABDOMEN:  Obese, soft, nontender, and nondistended.  Bowel sounds are  present x4.  EXTREMITIES:  Warm and dry.  No clubbing, cyanosis, or trace lower  extremity edema.  Dorsalis pedis and posterior tibial pulses 1+ and  equal bilaterally.   Chest x-ray from October 01, 2008 showed no acute cardiopulmonary  process.  EKG shows sinus rhythm at rate 89, normal axis.   LAB WORK:  Hemoglobin 13.6, hematocrit 40, WBC 9.8, and platelets 360.  Sodium 139, potassium 4.1, chloride 104, CO2 of 26, BUN 9, creatinine  0.7, glucose 142, total bilirubin 0.6, alkaline phosphatase 101, AST 30,  ALT 22.  TSH 1.990, hemoglobin A1c 7.1, lipase 36.  Cardiac markers  negative x2.  Calcium 9.0, magnesium 2.0, phosphorus 3.2.  Urinalysis  was negative.  Urine culture and blood cultures were pending.   ASSESSMENT AND PLAN:  1. Left scapular pain mostly at rest and more specifically with lying      down, better with repositioning and worse with palpation.  She does      get relief with antacids.  Cardiac markers are negative and ECG is      normal.  Doubt cardiac.  We would not plan to additional cardiac      workup.  2. Systolic murmur noted on exam.  We can schedule her for outpatient      2-D echocardiogram to evaluate.  3. Hypertension, blood pressure elevated on hydrochlorothiazide.      Heart rate is 90.  Consider beta-blocker.  4. Tobacco use.  Smoke cessation is strongly advised.  5. Morbid obesity.  Consider a nutrition evaluation.      Murray Hodgkins, ANP      Minus Breeding, MD, [facility]  Electronically Signed    CB/MEDQ  D:  10/02/2008  T:  10/03/2008  Job:  [id number]

## 2011-01-28 NOTE — Discharge Summary (Signed)
[name], PARROT NO.:  [id number]   MEDICAL RECORD NO.:  [id number]          PATIENT TYPE:  INP   LOCATION:  [id number]                         FACILITY:  [facility]   PHYSICIAN:  [name], M.D.  DATE OF BIRTH:  Sep 02, 1966   DATE OF ADMISSION:  10/01/2008  DATE OF DISCHARGE:  10/03/2008                               DISCHARGE SUMMARY   PMD:  Jana Hakim, M.D.   DISCHARGE DIAGNOSES:  1. Atypical chest pain.  2. Heart murmur.  3. Uncontrolled hypertension.  4. Smoking history.  5. Chronic pain syndrome.  6. Gastroesophageal reflux disease.  7. Osteoarthritis/degenerative joint disease.  8. Morbid obesity.   DISCHARGE MEDICATIONS:  1. Topamax 100 mg p.o. daily.  2. Lexapro 20 mg p.o. daily.  3. Hydrochlorothiazide 25 mg p.o. daily.  4. Morphine sulfate extended release 60 mg p.o. q.12 hourly.  5. Oxycodone 30 mg p.o. p.r.n. q.4 hourly.  6. Nabumetone 750 mg p.o. p.r.n. b.i.d.  7. Carisoprodol  350 mg p.o. t.i.d.  8. Bupropion 150 mg p.o. q.a.m.  9. Protonix 40 mg p.o. daily.  10.Lopressor 25 mg p.o. b.i.d.  11.Nicoderm CQ (21 mg); one patch to skin daily.   PROCEDURES:  1. Chest x-ray dated October 01, 2008, this showed nonacute      cardiopulmonary process.  2. Chest CT angiogram dated October 02, 2008, this showed no definite      evidence of pulmonary emboli.  No evidence of thoracic aortic      aneurysm/dissection .  There was cardiomegaly.   CONSULTATIONS:  Dr. Vita Barley, cardiologist.   ADMISSION HISTORY:  As in HPI notes of October 01, 2008, dictated by Dr.  Bea Laura. However, in brief, this is a 45 year old  female, with known history of morbid obesity, bilateral knee arthritis,  status post arthroplasty, left shoulder rotator cuff syndrome, smoking  history, depression, hypertension, GERD, and chronic pain, presenting  with chest pain of approximately 1-1/2 weeks' duration, described as  left scapula/back pain  initially, but lately anterior pressure-like, and  retrosternal.  She presented to the Emergency Department and was  relieved by sublingual Nitroglycerin.  She was admitted for further  evaluation, investigation and management.   CLINICAL COURSE:  1. Atypical chest pain.  For the details of presentation, refer to the      admission history above.  Description of the chest pain was      somewhat atypical in character, 12-lead EKG showed no acute      ischemic changes, cardiac enzymes were cycled and remained un-      elevated.  As of a.m. of October 02, 2008, the patient had no      recurrences of chest pain.  However, it was felt that she did have      risk factors for coronary artery disease, necessitating cardiology      consultation, which was kindly provided by Dr. Lenna Sciara. Percival Spanish.  For      details of that consultation, refer to consultation notes of      October 02, 2008.  He has opined that the patient's chest pain  appears noncardiac, and recommended no further cardiac workup.  The      patient did have an elevated D-dimer at 0.98 on October 02, 2008,      necessitating doing a chest CT angiogram, which showed no evidence      of pulmonary embolism.  The patient has been reassured accordingly.   1. Heart murmur:  This was noted on chest auscultation by Dr. Vita Barley, and he has scheduled the patient for an outpatient 2D      echocardiogram in order to evaluate this.   1. Hypertension:  The patient's blood pressure as of a.m. October 02, 2008, was borderline at 140/84 mmHg; however, by October 03, 2008,      it was significantly elevated at 149/97 mmHg.  The patient is      currently on Hydrochlorothiazide, which we have continued; however,      we have had to add a beta blocker, Lopressor, in an initial dose of      25 mg p.o. b.i.d. to her treatment. We shall defer followup of her      blood pressure to her primary MD.   1. GERD:  There were no problems referable  to this.  The patient      continued on proton pump inhibitor.   1. Smoking history:  The patient continues to smoke about a pack of      cigarettes per day.  She has been counseled appropriately, and was      managed with Nicoderm CQ patch.   1. Chronic pain syndrome:  This did not prove problematic, on pre-      admission opioid analgesics.   1. Morbid obesity:  The patient assures me that she plans to continue      to lose weight, and claims that she has already lost 150 pounds.      TSH essentially was normal at 1.990, and lipid profile showed the      following findings:  total cholesterol 104, triglyceride 61, HDL      51, LDL 81, excellent, good profile.  We have reassured the patient      accordingly.   DISPOSITION:  The patient was, on October 03, 2008, asymptomatic and  clinically stable for discharge. She was therefore, discharged  accordingly.   DIET:  Heart healthy.   ACTIVITY:  As tolerated.   FOLLOWUP INSTRUCTIONS:  The patient is to followup with her primary MD,  Dr. Jana Hakim, routinely, per prior scheduled appointment.  In  addition, Dr. Lenna Sciara. Hochrein's office will contact the patient to arrange  outpatient 2D echocardiogram in order to evaluate her systolic murmur.      [name], M.D.  Electronically Signed     CO/MEDQ  D:  10/03/2008  T:  10/03/2008  Job:  [id number]   cc:   Jana Hakim, M.D.  Minus Breeding, MD, [facility]

## 2011-01-28 NOTE — Assessment & Plan Note (Signed)
[name] is a 45 year old patient of Dr. Montez Morita.  She was  last seen by me on May 26, 2007.  She has a history of multiple  pain complaints, including cervicalgia, elbow pain, hand pain, back  pain, knee pain and foot pain.   Her chief complaint has been knee pain recently.  She underwent knee  radiographs on June 21, 2007.  The radiographs were reviewed with her  today.   She was told she has tricompartmental degenerative arthritis with at  least one loose body in the right knee and posterior loose bodies noted  in the left knee.  Both joints are noted to have effusions, a small one  on the right and a moderate-sized one on the left.   She states her average pain is about a 10 on a scale of 10.  Pain is  typically dull, stabbing, aching in nature, worse with activities,  especially standing, although sitting can be uncomfortable for her too  for a long period of time.  Her pain does improve with medications.   Last month, she was weaned down off her Neurontin, per her request.  However, she noted that her overall pain seemed to get worse after she  decreased her Neurontin.  She is requesting to return back to three  times a day.   She is able to walk about 30 minutes, if she can stand intermittently.  She is able to climb stairs.  She does not drive.  She notes she needs  assistance with dressing, bathing, meal prep and shopping.  She is  independent with feeding, toileting and household duties.   She admits to some depression, denies suicidal ideation, denies problems  controlling bowel or bladder.   She reports no new medical problems in the interim since I last saw her.  No changes in her social or family history are noted.   MEDICATIONS:  Provided by our clinic, include:  1. Vicodin 5/500 one p.o. t.i.d. #90 per month.  2. Ibuprofen 200 mg one to two each day in the morning.  3. Neurontin 300 mg daily.  She was weaned down from 300 mg three      times a  day; however, is requesting to return back to three times a      day dosage.   EXAM:  Her blood pressure is 152/61, pulse 76, respirations 18, 98%  saturated on room air.  She is well-developed, morbidly obese, female,  who does not appear in any distress.  She is oriented times three.  Her  speech is clear, her affect is bright, she is alert, cooperative and  pleasant and follows commands without difficulty.  She is able to  transition from sitting to standing a bit slowly.  However, her gait is  stable in the room.  She has significant valgus deformity at her knee on  the left and milder so on the right.  Her balance is overall quite good.  She has limitations in lumbar range of motion in all planes.  Reports  increased pain, especially with extension.  Seated, reflexes are  diminished in the lower extremities, no abnormal tone is noted, no  clonus is noted.  She has good strength, however, at hip flexors, knee  extensors, dorsiflexors and plantar-flexors.  She does have tenderness  along the medial joint lines bilaterally.   IMPRESSION:  1. Bilateral knee tricompartmental osteoarthritis with bilateral      fusion and loose body.  2. Lumbago with stenosis-like  symptoms.  3. Cervicalgia, intermittent, with some mild degenerative changes in      the cervical spine.  4. History of carpal tunnel surgery.  5. Bilateral shoulder/neck pain.  Will monitor for now.   PLAN:  1. We will send her to physical therapy to assess for appropriate      assistive device.  May consider __________ crutches or a walker,      which allows her to sit intermittently.  2. Would like her to follow up with Dr. Wynelle Link for further evaluation      of her knees.  We will send a copy of the x-ray report, as well.  3. We will start her back on Neurontin 300 mg, titrating her up to      four times a day over the next month.  4. We will refill her Vicodin 7.5/500 up to three times a day for back      or knee pain  #90, no refills.   She has taken her medications as prescribed.  She has not had any  problems with their use.  No aberrant behavior has been noted.  Ibuprofen is not giving her any trouble at this point with respect to  any abdominal complaints.  She has found that the Neurontin is quite  helpful, especially in the evening and at night, and would like to start  it again during the day.  We will see her back in a month.           ______________________________  Franchot Gallo, M.D.     DMK/MedQ  D:  06/23/2007 10:28:18  T:  06/23/2007 14:47:15  Job #:  [id number]   cc:   Gaynelle Arabian, M.D.  Fax: [place] Spruill, M.D.  Fax: [phone]

## 2011-01-28 NOTE — Assessment & Plan Note (Signed)
She was seen previously by me February 26, 2005.  She is now a 45 year old  morbidly obese African American female who is referred again by Dr.  Montez Morita for evaluation and treatment of chronic pain.   In the interim, Ms. Romos has developed chronic bilateral neck and  shoulder pain.  This started about a year and a half ago beginning on  the right and progressing to the left side.   She has had shoulder radiographs, which showed some mild AC joint  degenerative changes.  She states her pain, however, is a 10 on a scale  of 10.  Her shoulder pain is her biggest complaint at this time.  She  states she sleeps sitting up because lying down brings on the pain even  more so.   She reports that the pain is burning and throbbing in nature radiating  to the posterior scapular region bilaterally.   She has been taking up to 4 Vicoprofen per day, 7.5/200 mg tablets, and  at one point apparently was taking a bit more than she should have.   Her pain is also associated with some numbness in to the ring and little  finger.  She also has some previous numbness in the right thumb and  index finger.  She has undergone bilateral carpal tunnel releases within  the last year as well.   Other complaints that she brings up today include bilateral knee pain.  She describes this an 8 on a scale of 10, worse with ambulation.  She is  using a quad cane, and bilateral foot discomfort, which she describes as  a 6 on a scale of 10.  Her low back is about a 6 on a scale of 10 as  well.  Again, her shoulders are her biggest problem at this time.   She can walk about 15 minutes at a time.  She is able to climb stairs  slowly.  She is now participating in a water aerobics program for the  last 6 months, going about 3 times a week, 30 minute to 1 hour sessions.  She has aid who comes in and helps her.  She is independent with  feeding, bathing, toileting, meal prep.  Needs some assistance with  dressing, shopping,  and higher level household duties.   She admits to some numbness, weakness, and tingling.  Denies depression  and anxiety.  Denies suicidal ideation.   REVIEW OF SYSTEMS:  Otherwise, noncontributory.   PAST MEDICAL HISTORY:  Negative for diabetes, ulcers, cancer, kidney  problems, thyroid problems, heart problems, or high blood pressure, or  liver problems.   PAST SURGICAL HISTORY:  Positive for previous carpal tunnel surgery  remotely, and more recent carpal tunnel surgery bilaterally 2007.  Status post cholecystectomy in the past, and is status post foot surgery  October 1999.   She smokes 6 cigarettes a day.  Denies illegal substance use.  Denies  alcohol use.  Lives with her daughters' father.  She has 2 daughters,  ages 35 and 52.   MEDICATIONS:  She is currently on:  1. Vicoprofen 7.5/200 up 4 times a day.  2. Alprazolam 1 tablet 3 times a day.  3. Lexapro 20 mg daily.  These are provided by Dr. Montez Morita.   EXAMINATION:  Blood pressure is 142/82.  Pulse 94.  Respirations 18.  Saturations 98% on room air.  She is a morbidly obese African American female who appears her stated  age.  She is oriented x3.  Her speech is clear.  Her affect is bright.  She is  alert, cooperative, and pleasant.   She transitions from sitting to standing without difficulty today.  Her  gait in the room is not antalgic.  She does have a significant valgus  deformity in both knees, and has a relatively short stride length which  is symmetric.   She has minimal limitations in lumbar motion.  Cervical range of motion  is evaluated.  She reports increased pain with extension and rotation at  end range both right and left.  She has limitations in shoulder motion  up to 100 degrees bilaterally.  Reflexes are evaluated in the upper  extremities, are 2+ biceps, triceps, brachioradialis bilaterally, 2+ at  the patellar and Achilles' tendon.  She reports diminished sensation in  the right C6 dermatome,  and hypersensitivity in the right C8 dermatome.   Motor strength, however, is in the 5/5 range in both upper and lower  extremities.  No focal weakness is appreciated.   Internal and external rotation of bilateral shoulders does not increase  her pain.  She has mild tenderness over bilateral AC joints.  Palpation  over the scapular region and cervical paraspinal musculature does not  increase her pain.   IMPRESSION:  1. New bilateral shoulder pain exacerbated with neck rotation and      extension.  2. Bilateral tricompartmental knee osteoarthritis.  3. Status post carpal tunnel surgery.  4. Lumbago.   PLAN:  We will obtain cervical AP, flexion and extension films.  May  also consider cervical MRI to rule out disk impingement or facet  encroachment.  She has tried Lidoderm in the past, and found it not to  be that helpful.  She is currently using a TENS unit.  Anticipate we  will be refilling her Vicoprofen once we obtain urine drug screen and  review it.   I anticipate we will not continue her on Vicoprofen after 1 month.  We  will probably some acetaminophen, depending what cervical radiographs  and MRI show, may consider other treatment options.  We will see her  back in 3 weeks.           ______________________________  Franchot Gallo, M.D.     DMK/MedQ  D:  03/03/2007 13:21:58  T:  03/03/2007 16:53:44  Job #:  [id number]   cc:   Ardyth Gal. Spruill, M.D.  Fax: [phone]

## 2011-01-28 NOTE — Assessment & Plan Note (Signed)
[name] is a 45 year old African-American female who has  multiple pain complaints, including cervicalgia, shoulder pain, hand  pain, bilateral knee pain, and foot pain.   Her chief complaint is shoulder and knee pain.  The average pain is  about a 9-10 on a scale of 10.  The pain is described as sharp,  stabbing, tingling, aching, which is present whether she is active or  nonactive; however, tends to get worse with activity.   Relief with meds is a little.   She is able to walk about 30 minutes at a time, able to climb stairs.  She is not driving.  She is engaged in a therapeutic water aerobics  program at the [employer].   She is independent with her self care.  Occasionally needs help with  dressing, household duties, and shopping.  Independent with feeding,  toileting, and bathing, meal prep.   Admits to depression and anxiety.  Denies suicidal ideation.   No change in past medical, social, and family history since the last  visit.   SOCIAL HISTORY:  Unchanged.  Lives with her children and their father.   Urine drug screen was reviewed with her.  Benzodiazepines were negative.  She stated she had taken one pill the day before; however, she states  today that she is not sure if she actually took the pill since she takes  other pills and was not sure whether she took her alprazolam the day  before.   Also, oxycodone was positive on the drug screen.  She reported that she  was getting a prescription from her PCP for Vicoprofen but denied  receiving any prescription from any other physician for oxycodone.  She  states that she had 2 Percocet that her grandmother gave her.   PHYSICAL EXAMINATION:  VITAL SIGNS:  On exam today, her blood pressure  is 168/99, pulse 94, respirations 18, 99% saturated on room air.  GENERAL:  She is a morbidly obese female who appears her stated age.  She is oriented x 3.  Affect is bright, alert, cooperative, and  pleasant.  Speech is  clear.  Follows commands without difficulty.  NEUROMUSCULAR:  Transitions from sitting to standing without problems;  however, her gait seems a bit antalgic.  She does use a quad cane, which  she has with her.  Limitations in lumbar motion noted in all planes.  She has full strength in the lower extremities.  Reflexes are diminished  at the patella, and Achilles tendons are 2+ at the biceps, triceps,  brachial radialis.  Upper extremity strength is in the normal range.  No  focal deficits are appreciated.  No sensory deficits are appreciated.  Coordination is grossly intact.   IMPRESSION:  1. Cervicalgia:  Radiographs are reviewed with her.  She does have a      small osteophyte off the superior aspect of C6.  No instability was      noted.  Mild degenerative changes were noted throughout.  2. Bilateral tricompartmental knee osteoarthritis.  3. Status post carpal tunnel surgery.  4. Lumbago.   Will hold off on ordering an MRI at this point.  Encouraged her to  continue to follow up with water aerobics at the [facility].  I reviewed  narcotics agreement with her.  She states that she understands she is  not to be receiving narcotics from any other source other than this  clinic.  Will check a urine drug screen today.  Will write her a  prescription for Vicodin 5/500, up to 3 times a day, p.r.n. pain, #90,  and also wrote her a prescription for ibuprofen 200 mg 1-2 tablets daily  as needed, #60.  Will monitor her for abdominal discomfort.  She has  been on Vicoprofen, however, up to 3-4 times a day in the past.   Will see her back in a month.  Will check a urine drug screen at that  time.           ______________________________  Franchot Gallo, M.D.     DMK/MedQ  D:  03/31/2007 11:44:48  T:  04/01/2007 01:01:09  Job #:  [id number]

## 2011-01-28 NOTE — Op Note (Signed)
[name], [name]   MEDICAL RECORD NO.:  [id number]          PATIENT TYPE:  INP   LOCATION:  [id number]                         FACILITY:  [facility]   PHYSICIAN:  Newt Minion, MD     DATE OF BIRTH:  1966/04/29   DATE OF PROCEDURE:  04/13/2008  DATE OF DISCHARGE:                               OPERATIVE REPORT   POSTOPERATIVE DIAGNOSIS:  Osteoarthritis, right knee.   POSTOPERATIVE DIAGNOSIS:  Osteoarthritis, right knee.   PROCEDURE:  Right total knee arthroplasty with size 3 tibia, size 3  femur, posterior stabilized with 17.5-mm poly tray and a 32-mm patella.   SURGEON:  Newt Minion, MD   ASSISTANT:  Epimenio Foot, [facility]   ESTIMATED BLOOD LOSS:  1000 mL.   ANESTHESIA:  General plus femoral block.   ANTIBIOTICS:  2 g of Kefzol.   TOURNIQUET TIME:  None.   DISPOSITION:  To PACU in stable condition.   INDICATIONS FOR PROCEDURE:  The patient is a 45 year old woman who is  status post a left total knee arthroplasty.  She has had persistent pain  with activities of daily living in the right knee and has progressed  well with her left total knee arthroplasty and wishes to proceed at this  time for a right total knee arthroplasty.  Risks and benefits were  discussed including infection, neurovascular injury, persistent pain,  need for additional surgery including DVT and pulmonary embolism.  The  patient states she understands and wish to proceed at this time.   DESCRIPTION OF PROCEDURE:  The patient was brought to OR room 4 and  underwent a general anesthetic.  After undergoing a femoral block, right  lower extremity was prepped using DuraPrep and draped in a sterile  field, and Ioban was used to cover all exposed skin.  A midline incision  was made.  This was carried down with a medial parapatellar retinacular  incision.  The patella was everted.  The canal reamer was used to start  the canal hole.  Intramedullary guide was used and this  was set for 5  degrees of valgus and 11 mm of the distal femur.  The distal femoral cut  was made.  The femur was sized for a size 3 and the size 3 chamfer  blocks were placed and the chamfer cuts were made for size 3.  Attention  was then focused to the tibia.  The external alignment guide was used.  After a neutral varus-valgus, neutral posterior slope, 10 mm was taken  off the proximal tibia.  This was sized for a size 10 and the cutting  block and keyhole punches were placed for the size 3 tibia.  Attention  was then focused on the femur, the box cut was placed, then the box cut  was made for the femur.  The femoral tray was placed and leg holes were  drilled.  The tibia was sequentially tried with different sizes of  tibial tray.  Due to the significant amount of valgus the patient had in  her knee, we went up to a 17.5  tray.  Medial and lateral ligamentous  structures were released to stabilize this at 17.5 mm with polyethylene.  The wounds were irrigated with normal saline and the tray instruments  were removed.  The patella was resurfaced and 10 mm was taken off of the  patella and this was sized for a 32-mm patella button and the PEG cuts  were made for the patella button.  The implants were removed.  The knee  was irrigated with pulsatile lavage.  The popliteal fossa was injected  with a total of 60 mL of 0.25% Marcaine plain.  The tibial and the  femoral components were cemented and placed.  Loose cement was removed.  The tibial tray was placed.  Again, pulsatile lavage was performed.  The  patellar button was also placed with a clamp left in place.  The knee  was left in extension until the cement had hardened.  After the cement  had hardened and all the loose cement was removed, the knee was placed  with range of motion.  She could go from 0 to about 110 degrees.  The  patient had a significant contraction prior to surgery and did not have  this much range of motion.  The  medial patellar retinacular incision was  closed using #1 Vicryl,  subcutaneous was closed using 2-0 Vicryl,  and  the skin was closed using approximate staples.  The wound was covered  with Adaptic, orthopedic sponges, ABD dressing, Webril, and Coban.  The  patient was then extubated and taken to PACU in stable condition.      Newt Minion, MD  Electronically Signed     MVD/MEDQ  D:  04/13/2008  T:  04/14/2008  Job:  [id number]

## 2011-01-28 NOTE — Assessment & Plan Note (Signed)
[name] is a 45 year old morbidly obese African American female was  last seen by me on March 03, 2007. At that time her chief complaint had  been bilateral neck and shoulder pain.   Today her chief complaint is low back and bilateral thigh pain.   She states that her pain is about a 10 on a scale of 10. Describes the  pain in her low back and legs as sharp, burning, stabbing, constant, and  aching in nature.   She states that when she is at the counter she has to bend forward to  complete her task, putting weight through her upper extremities, and  this alleviates some of the pain in her low back when she does this, and  alleviates some of the pain going down her legs when she does this.   Pain is typically worse with prolonged standing or sitting, bending. She  gets a little relief with current medication.   She has limited ambulation capacity. She does use a four pronged walker  and she uses a scooter when she is in stores if she has a chance.   She takes care of her husband and her 2 children.   She denies suicidal ideations. She denies depression and anxiety, bowel  or bladder problems.   REVIEW OF SYSTEMS:  Noncontributory. She denies any new problems  regarding past medical, social, or family history.   Radiographs which were done on March 03, 2007 are reviewed with her. She  had cervical spine flexion/extension films, no instability was noted.  There was noted a prominent __________ emanating off the superior aspect  of C6.   Urine drug screen done at the last visit as well was consistent with her  narcotic use of hydrocodone, however there was an inconsistency there  was a small amount of alcohol present 48 milligrams per __________ . The  patient states that she had been taking NyQuil, however has stopped. She  denies using any kind of alcoholic beverage however.    Medications prescribed by this clinic include;  1. Ibuprofen 1 to 2 tablets p.o. daily.  2. Vicodin  5/500 one p.o. t.i.d. p.r.n. #90 last month.   PHYSICAL EXAMINATION:  Blood pressure is __________ , pulse 88,  respirations 18, and 99% saturated on room air. She is a morbidly obese,  black female who does not appear in any distress. She is oriented x3.  Her speech is clear. Her affect is bright, alert. She is cooperative and  pleasant. She can follow commands without any difficultly. At 1 point  she did become quite upset when she was discussing her husband's  physical and emotional condition. She has significant concerns regarding  his health at this time.   She was able to transition from sitting-to-standing independently. She  had a little trouble getting up out of her chair due to her size. Her  gait in the room was a bit wide based and __________ short stride  length. She had equal weight bearing throughout both lower extremities.  No significant antalgia was appreciated. She has limitations in lumbar  motion, increased back pain with extension. Her reflexes in the lower  extremity were diminished at the patellar and Achilles tendons. She does  however have good strength at hip flexors and __________ , planter  flexors, and __________ . Her coordination and balance are intact.   No abnormal tone was noted in the lower extremities.   Internal and external rotation at both right and left hip  did increase  her pain in the hip and low back region, more so on the left than on the  right.   IMPRESSION:  1. Hypertension, would like patient to follow up with primary care      physician regarding this.  2. Bilateral shoulder and neck pain, exacerbated with neck      rotation/extension, today not as significant a problem as it had      been at the last visit. We will continue monitor this, may consider      MRI if it should worsen or become a significant problem for her.  3. Bilateral __________ osteoarthritis.  4. Hip pain especially with internal and external rotation of the hip       joint bilaterally, worse on left. We will obtain hip radiographs to      evaluate this joint bilaterally.  5. History of carpal tunnel surgery.  6. Low back pain with symptoms suggestive of possible stenosis-like      symptoms. We will obtain flexion/extension lumbar radiographs to      evaluate her lumbar spine further.   We will refill the following medications for her today;  1. Vicodin 5/500 one p.o. t.i.d. p.r.n. back pain or shoulder pain.  2. We will trial her on Neurontin 300 mg 1 p.o. at bedtime for a week      then titrating it up to 3 times a day.  3. Ibuprofen 200 mg 1 to 2 tablets per day #60.   I will see her back in a month. We will go over her radiographs with her  at that time of her low back and hip. She is quite concerned regarding  her husband's health at this time.           ______________________________  Franchot Gallo, M.D.     DMK/MedQ  D:  04/29/2007 13:21:44  T:  04/30/2007 09:39:20  Job #:  [id number]

## 2011-01-28 NOTE — H&P (Signed)
NAMESELAM, [name] NO.:  [id number]   MEDICAL RECORD NO.:  [id number]          PATIENT TYPE:  INP   LOCATION:  [id number]                         FACILITY:  [facility]   PHYSICIAN:  Bea Laura, MDDATE OF BIRTH:  08-01-1966   DATE OF ADMISSION:  10/01/2008  DATE OF DISCHARGE:                              HISTORY & PHYSICAL   This is an admission to [facility], [facility].   CHIEF COMPLAINT:  Chest pain.   HISTORY OF PRESENT ILLNESS:  This is a 45 year old, very obese African  American female who has the past medical history significant for  hypertension, history of smoking, history of depression, history of  arthritis of bilateral knees with status post arthroplasty, and a  rotator cuff problem of her left shoulder, coming in with 1-1/2-week  history of chest pain.  The whole history was given by the patient  herself.  According to her, the chest pain started about 1-1/2 weeks  ago.  She states that she might have hurt her chest or twisted her  muscles in the sleep and since then, she has been hurting her chest.  She was taking the pain medication that she was on for her arthritis,  then she was taking OxyContin as well as MS Contin, and she states that  her pain was getting better and for the past 1 day to 1-1/2 day, she ran  out of her medications and that is why, she could not control her pain.  When she came into the ER, she told a totally different story to the ER  physician that she started having the left-sided chest pain started this  morning around 4:30 a.m., and it was pressure like as an elephant was  sitting on her, so because of the typical type of chest pain that she  described and because of obesity, it was decided to admit this patient  to rule out MI.  The patient was given 2 nitroglycerin in the ER and the  patient's chest pain relieved.  Now, the patient denied having any  nausea, vomiting, headache, or blurry vision.  No diarrhea, no  constipation, no abdominal pain, and no fever.   PAST MEDICAL HISTORY:  Significant for:  1. Hypertension.  2. History of bilateral osteoarthritis of her knees.  3. Rotator cuff injury of her left shoulder.  4. History of depression.  5. GERD.   PAST SURGICAL HISTORY:  The patient had right total knee arthroplasty  and left total knee arthroplasty in 2009.   FAMILY HISTORY:  Not significant.   HOME MEDICATIONS:  Carisoprodol, aspirin, nabumetone, potassium  chloride, morphine, Topamax, Lexapro, Protonix, hydrochlorothiazide, and  clonazepam.   REVIEW OF SYSTEMS:  Significant for musculoskeletal pain all over her  upper chest as well as left shoulder pain, bilateral knee pain, and  constipation.   ALLERGIES:  This patient has no known drug allergies.   PHYSICAL EXAMINATION:  GENERAL:  A 45 year old obese African American  lady who is sitting on her bed, very comfortably and talking to me  without any pain, without any shortness of breath.  VITAL SIGNS:  Blood  pressure when she came into the ER, her blood  pressure of 158/110, later it went down to 142/89, pulse rate of 94,  respirations 20, temperature of 99.1, and saturating 100% on 2 L.  HEENT:  Head is atraumatic and normocephalic.  Pupils PERRLA.  Tympanic  membrane intact.  No discharge from eyes or ears.  LUNGS:  Clear to auscultation bilaterally.  CVS:  S1 and S2 heard with regular rate and rhythm.  ABDOMEN:  Soft, obese.  Bowel sounds present.  Nontender, nondistended.  EXTREMITIES:  No pedal edema noted.  Pulses palpable bilaterally.  No  cyanosis and no clubbing.  CNS:  Awake, alert, and oriented x3 and no focal neurological deficits  noted.   LABORATORY DATA:  CO2 of 24, ionized calcium 1.16.  WBC 9.8, hemoglobin  13.6, hematocrit 40.0, and platelets of 360, basophils of 3, and INR of  1.  Sodium 139, potassium 4.1, chloride 104, bicarb 26, glucose 142, BUN  of 9, and creatinine of 0.7, and calcium of 9.1.  AST,  ALT, alk phos,  and total bilirubin within normal limit.  Cardiac biomarkers:  First set  of troponin I less than 0.05, first set of CK-MB less than 1, myoglobin  46.4.  Urine pregnancy is negative and urine drug screen positive for  opiates.  Urinalysis is pretty much insignificant.  Chest x-ray revealed  no acute cardiopulmonary process.   ASSESSMENT AND PLAN:  This is a 45 year old, very obese African American  female coming in with chest pain, which is atypical in nature and not  associated with nausea, vomiting, and which has been there for the past  1-1/2 weeks.  1. Atypical chest pain.  At this time, is most likely due to      musculoskeletal pain as this has been present for the past 1-1/2      weeks and also relieved with the pain medications at home.  The      pain is also getting worse on movement.  As this patient is obese      and has history of hypertension, and as such she came into the ER      as she complained of typical left-sided chest pain with a pressure      like and relieved with nitroglycerin, and we are going to admit her      for 1 day just to make sure this is not coming from the heart.  So      far, her first set of troponin I and CK-MB are negative, so we will      get the serial CK-MB and troponin I and EKG also is normal.  We      will monitor her serial enzymes and if they are negative, we will      discharge her to get a stress test as an outpatient.  2. History of hypertension.  The patient takes hydrochlorothiazide on      and off only as needed at home depending upon her symptoms.  So at      this time, we will continue her hydrochlorothiazide that she has      been on at home because her blood pressure is little elevated in      about 142/89 when she came to the ER.  3. History of bilateral knee arthritis, status post arthroplasty.  So,      the patient will be getting the pain medications at this time.  4. History of depression.  The patient will be  getting the same home      medication that she has been on, that is Lexapro.  5. For gastrointestinal prophylaxis.  The patient will be started on      Protonix p.o. 40 mg 1 time a day.  6. History of gastroesophageal reflux disease.  The patient will be      given Protonix p.o. 40 mg 1 time a day.  7. Deep venous thrombosis prophylaxis.  The patient will be started on      Lovenox 40 mg subcu 1 time a day.   DISPOSITION:  To home once the patient is clinically stable.      Bea Laura, MD  Electronically Signed     JD/MEDQ  D:  10/01/2008  T:  10/02/2008  Job:  [id number]

## 2011-01-28 NOTE — Assessment & Plan Note (Signed)
[name] is a patient of Dr. Lollie Sails. She is a 45 year old morbidly  African-American female who was last seen by me on 04/29/2007.   She is being followed in our pain and rehabilitative clinic for multiple  pain complaints including chronic cervicalgia, bilateral shoulder pain,  lumbago, bilateral knee pain, and foot pain.   She has stenosis-like symptoms and recent lumbar radiographs were  obtained for her. She is back in today.   CHIEF COMPLAINT:  Low back pain. She is typically worse with prolonged  standing, sitting, or bending. She gets a little relief with the current  medication. She is requesting dosage escalation at this time however.  She does use a four pronged cane for an assisted device and also uses a  scooter when she is the store if she gets a chance.   The pain is described as a 10 on a scale of 10. It is worse in the  morning and in the evening. Sleep tends to be poor. Pain is described as  sharp, burning, stabbing, tingling, and aching in the areas, especially  in the parascapular region and the low back. Left knee is more painful  than the right knee and she complains that it seems to tilt inward as  well.   She is able to walk about 20 minutes at a time. She is able to climb  stairs. She does not drive. She requires some assistance with dressing  and bathing. She has a nurse that comes in to help with this. She denies  suicidal ideation. She does admit to some depression. She denies  problems controlling bowel or bladder.   REVIEW OF SYSTEMS:  Attached to the chart. Non-contributory.   PAST MEDICAL HISTORY:  Otherwise unchanged.   FAMILY HISTORY:  Otherwise unchanged.   SOCIAL HISTORY:  Otherwise unchanged.   MEDICATIONS:  Medications provided by this clinic include the following:  1. Ibuprofen 200 mg 1 to 2 daily in the morning.  2. Vicodin 5/500 mg 1 t.i.d.  3. Neurontin 300 mg t.i.d.   PHYSICAL EXAMINATION:  VITAL SIGNS:  Blood pressure 154/88, pulse  100,  respirations 29, 98% saturated on room air.  GENERAL:  She is a morbidly obese female who does not appear in any  distress.   She is oriented x3. Speech is clear. Affect is bright, alert, cooperate  and pleasant. She follows commands without difficulty.   Transitioning from sit to stand is done somewhat slowly. Her gait is  slightly antalgic and uneven. She does have significant valgus deformity  at the left knee.   She has limitations in lumbar motion in all plains.   Reflexes are 1+ in the upper extremities at the biceps, triceps,  brachioradialis, and symmetric. 1+ at the patellar tendons bilaterally,  0 at the ankles bilaterally. She has a significant amount of tissue  around the knees making a good exam difficult. She does have tenderness  along the medial joint line. She may have a slight effusion on the left.  She has some medial lateral laxity.   She has just a mild amount of lymphedema in the calves.   Motor strength, however, is quite good. No abnormal tone is noted. A  sensory exam is intact.   IMPRESSION:  Left knee pain is mostly likely secondary to osteoarthritis  and she may have some internal derangement as well. We will start with  AP lateral radiographs of the bilateral knees, and may move to MRI to  further investigate the  causes of pain in especially this left knee for  her. Lumbar and hip radiographs were reviewed with her today. She has no  significant abnormality noted in the hip joints. SI joints were also  noted as normal. She has degenerative changes, advanced for age at L4,  5, and L5, S1, with respect to loss of intravertebral disk height  and  facet arthropathy. Also noted at T10 and T11. She also had advanced  spondylosis as well. She had some discomfort while performing a flexion  extension films and limited range of motion was noted with the flexion  films.   PLAN:  We will increase her Vicodin slightly to 7.5/500 3 times a day on  a  p.r.n. basis, number of 90 per month. I have asked her to try to limit  her use of Vicodin to the worse days of the week for her and see if she  can avoid it on other days using just the Tylenol and ibuprofen in its  place. Her ibuprofen was increased to 400 to 600 mg in the morning with  good. She has not had any problems with abdominal complaints with the  ibuprofen thus far. She has found that the Neurontin was not that  helpful for her and she would like to be weaned off of it. She really  only got to about 300 mg t.i.d. She does have concerns of weight gain  with it and felt that it was not providing her significant relief of her  pain, although she may benefit from titrating up to a higher dose. We  will hold off on this and may reinstitute it at a point down the road  from now. We will see her back in a month and review her radiographs of  her left knee with her at that time. A prescription was written for a  shower __________ chair today. Her prescription for Neurontin for being  weaned down was also written. Vicodin 7.5/500 mg 1 p.o. t.i.d. p.r.n.  for back and knee pain, number of 90, and ibuprofen 200 mg 2 to 3  tablets p.o. q.a.m. with food, number of 90, and one refill.           ______________________________  Franchot Gallo, M.D.     DMK/MedQ  D:  05/26/2007 11:17:41  T:  05/27/2007 01:02:00  Job #:  [id number]

## 2011-01-31 NOTE — Group Therapy Note (Signed)
HISTORY OF PRESENT ILLNESS:  [name] is a 45 year old morbidly obese black  female who is referred by Dr. Montez Morita for evaluation and treatment of  chronic pain. [name] has multiple areas of chronic pain involvement  including the following:  chronic low back pain, bilateral knee, left heel  pain, and bilateral arm pain. She complains of some intermittent numbness  and tingling in her legs. She reports that her main problems from the above  list are her knees and heel pain. She has had knee pain for quite some time  now. She has been evaluated by orthopedics, Dr. Alphonzo Cruise, and was told that  she was currently not a candidate for knee replacement due to her weight and  young age. Her heel pain has gone on for a couple of years now. Would  gradually come on worse when she is up and walking. Worse initially in the  morning as well. Not associated with numbness or tingling. Knee pain is  aggravated by prolonged standing and activities as well as prolonged  sitting. She denies any weakness in the upper extremities. Does note some  lower extremity weakness and lack of endurance. She reports some numbness in  her thighs, tingling in bilateral hands. She admits to some depression as  well as anxiety but denies any suicidal ideations. Her pain is fairly  constant. She describes it in the knees and feet as aching and burning. The  average pain is a 9 on a scale of 10. Currently while she is sitting in the  office, she has a pain of 8 on a scale of 10. Her pain is fairly constant in  the morning, daytime, and evening. Not as much of a problem at night while  she is resting. Current relief with medications which include Celebrex and  Vicodin and Seroquel have been fair. She is able to walk between 15 and 20  minutes at a time. She tells me that she is participating in a pool program.  Has lost about 45 pounds in the last 9 months. She does not drive. She does  not have a drivers license. She  currently uses a cane to walk, although she  did not bring a cane with her today. Apparently she was issued a new 4 prong  cane, which she has not gotten yet. She previously worked in 2005. 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 She is trying to get her GED. She has an 8th grade  education currently. She is independent with her self-care. Needs some  assistance with meal prep, household duties and shopping.   REVIEW OF SYSTEMS:  Positive for chills. I asked her to followup with Dr.  Montez Morita regarding her chills.   PAST MEDICAL HISTORY:  Negative for diabetes, cancers, ulcers, kidney  problems, thyroid problems, heart problems or high blood pressure. She does  have a history of multiple surgeries including surgery on her feet in March  of 2006, February 2004. Foot surgery as well in October 1999. Hand surgery,  carpal tunnel and in 1999, she had a cholecystectomy.   SOCIAL HISTORY:  She is single. She has 2 children 93 and 24 years old. Both  have some difficulties. One is diagnosed with bipolar disease. The other is  diagnosed with obsessive compulsive disorder. She smokes 1/2 pack of  cigarettes a day. Denies alcohol or illegal drug use.   FAMILY HISTORY:  Remarkable for mother alive age 78  with diabetes and  alcoholism. Father alive age 45 with hypertension. She has 2 sisters and 2  brother. One has schizophrenia.   PHYSICAL EXAMINATION:  VITAL SIGNS:  Blood pressure 144/94, pulse 75,  respiratory rate 18, 99% saturated on room air.  GENERAL:  A well developed, morbidly obese woman. She does not appear in any  distress during our interview.  NEUROLOGIC:  She is oriented x3. Her affect is overall bright and alert. She  is appropriate and cooperative. She is able to stand after being seated. Her  gait is slightly wide based due to her weight. She has a symmetric stride  length. She does appear somewhat stiff after being seated  for a while.  Seated, her reflexes are 2+ at the knees and ankles. She has intact  sensation in the lower extremities with 5 over 5 strength at the hip  flexors, knee extensors, dorsiflexors, plantar flexors, EHL. She has  tenderness over the lateral joint line on the right and the medial joint  line on the left. No obvious AP or mediolateral instability. Difficult to  appreciate effusion. She has quite a bit of tissue around the knee joint,  obscuring some of the normal contour of the knee joint itself. No edema in  noted in the lower extremities. She is exquisitely tender over the right  distal Achilles tendon. I did take off her shoes. Her shoes were noted to  have significant pressure areas due to the size of the shoe and put in left  a skin indentation in the posterior heel area.   IMPRESSION:  1.  Bilateral knee osteoarthritis.  2.  Right Achilles tendinitis, most likely secondary to ill fitting shoe.  3.  Low back pain and arm pain will be assessed further at the next visit.   PLAN:  Will get patient set up in physical therapy. Consider a tens unit,  pool therapy, Lidoderm. Also, will get her some hand splints ordered. She  does have a history of carpal tunnel symptoms. Will check urine drug screen.  Will trial her with Lidoderm today. Have her do some icing on the distal  Achilles tendon. Avoid shoes with backs at this time to prevent further  tendon irritation. In the next couple of months, would like to order her a  good fitting pair of supportive shoes for her. Will check UDS next visit.  Consider nutritional counseling. I am not sure if she may be a candidate for  the gastric bypass program. Will see her back in 1 month.       DMK/MedQ  D:  02/26/2005 16:56:38  T:  02/27/2005 00:32:16  Job #:  [id number]

## 2011-01-31 NOTE — Discharge Summary (Signed)
NAMEANTANETTE, [name] NO.:  [id number]   MEDICAL RECORD NO.:  [id number]          PATIENT TYPE:  INP   LOCATION:  [id number]                         FACILITY:  [facility]   PHYSICIAN:  Newt Minion, MD     DATE OF BIRTH:  [birth year]   DATE OF ADMISSION:  04/13/2008  DATE OF DISCHARGE:  04/22/2008                               DISCHARGE SUMMARY   FINAL DIAGNOSIS:  Osteoarthritis, right knee.   PROCEDURE:  Right total knee arthroplasty.   Discharged to home in stable condition with home health physical  therapy.  Follow up in the office in 1 week.   HISTORY OF PRESENT ILLNESS:  The patient is a 45 year old woman with  osteoarthritis of right knee.  She has failed conservative care and  presents at this time for total knee arthroplasty.   The patient's hospital course was essentially unremarkable.  She  underwent a right total knee arthroplasty on April 13, 2008.  She  received DePuy components with a size 3 tibia, size 3 femur, and 32-mm  patella with a 17.5-mm poly tray.  She received general anesthetic plus  a femoral block.  She received Kefzol for infection prophylaxis for 24  hours, and was started on Coumadin for DVT prophylaxis postoperatively.  Postoperatively, the patient progressed slowly with physical therapy.  Her hemoglobin was 8.8 on postoperative day #1 and dropped to 8.6 on  April 15, 2008.  She continued with her physical therapy, had no other  complications other than slow progress with her therapy, and was  discharged to home in stable condition on April 22, 2008, with followup  in the office in 1 week.      Newt Minion, MD  Electronically Signed     MVD/MEDQ  D:  05/18/2008  T:  05/18/2008  Job:  [id number]

## 2011-01-31 NOTE — Discharge Summary (Signed)
NAMEGERALDINA, [name] NO.:  [id number]   MEDICAL RECORD NO.:  [id number]          PATIENT TYPE:  INP   LOCATION:  [id number]                         FACILITY:  [facility]   PHYSICIAN:  Newt Minion, MD     DATE OF BIRTH:  1966/03/19   DATE OF ADMISSION:  10/20/2007  DATE OF DISCHARGE:  10/25/2007                               DISCHARGE SUMMARY   DIAGNOSIS:  Osteoarthritis, left knee.   PROCEDURE:  Left total knee arthroplasty with DePuy components, size 3  tibia and femur, 10 mm poly tray with a 32 mm patella.  Discharged to  home in stable condition with home health physical therapy.  Prescriptions for Tylox for pain control and Coumadin for DVT  prophylaxis.  Follow up in the office in 2 weeks.   HISTORY OF PRESENT ILLNESS:  The patient is a 45 year old woman with  osteoarthritis of her left knee.  She has failed conservative care.  She  has undergone prolonged conservative treatment including arthroscopy and  still has pain with activities of daily living.  Due to persistent pain,  the patient presents at this time for total knee arthroplasty.  The  patient's hospital course is essentially unremarkable.  She underwent  the total knee arthroplasty on October 20, 2007.  She was started on  Kefzol for infection prophylaxis with a 23 hours of IV antibiotics and  was started on Coumadin for DVT prophylaxis.  She was started with  physical therapy for progressive ambulation.  She progressed well with  therapy.  Her hemoglobin was 10.8 on February 6.  Her IV and PCA were  discontinued on October 6.  The patient continued to progress well with  therapy.  She was setup with [facility] for home health physical  therapy and was discharged to home in stable condition on February 9  with prescription for Tylox and Coumadin, and follow up in the office in  2 weeks.      Newt Minion, MD  Electronically Signed     MVD/MEDQ  D:  12/16/2007  T:  12/17/2007  Job:   [id number]

## 2011-03-04 ENCOUNTER — Observation Stay (HOSPITAL_COMMUNITY)
Admission: EM | Admit: 2011-03-04 | Discharge: 2011-03-06 | Disposition: A | Discharge status: Home / Self Care | Payer: Medicare Other | Attending: Internal Medicine | Admitting: Internal Medicine

## 2011-03-04 ENCOUNTER — Emergency Department (HOSPITAL_COMMUNITY): Payer: Medicare Other

## 2011-03-04 ENCOUNTER — Encounter (HOSPITAL_COMMUNITY): Payer: Self-pay

## 2011-03-04 DIAGNOSIS — M899 Disorder of bone, unspecified: Secondary | ICD-10-CM | POA: Insufficient documentation

## 2011-03-04 DIAGNOSIS — M5030 Other cervical disc degeneration, unspecified cervical region: Secondary | ICD-10-CM | POA: Insufficient documentation

## 2011-03-04 DIAGNOSIS — D649 Anemia, unspecified: Secondary | ICD-10-CM | POA: Insufficient documentation

## 2011-03-04 DIAGNOSIS — M549 Dorsalgia, unspecified: Secondary | ICD-10-CM | POA: Insufficient documentation

## 2011-03-04 DIAGNOSIS — M7989 Other specified soft tissue disorders: Secondary | ICD-10-CM

## 2011-03-04 DIAGNOSIS — R748 Abnormal levels of other serum enzymes: Secondary | ICD-10-CM | POA: Insufficient documentation

## 2011-03-04 DIAGNOSIS — M79609 Pain in unspecified limb: Secondary | ICD-10-CM | POA: Insufficient documentation

## 2011-03-04 DIAGNOSIS — M949 Disorder of cartilage, unspecified: Secondary | ICD-10-CM | POA: Insufficient documentation

## 2011-03-04 DIAGNOSIS — J9610 Chronic respiratory failure, unspecified whether with hypoxia or hypercapnia: Secondary | ICD-10-CM | POA: Insufficient documentation

## 2011-03-04 DIAGNOSIS — J4489 Other specified chronic obstructive pulmonary disease: Secondary | ICD-10-CM | POA: Insufficient documentation

## 2011-03-04 DIAGNOSIS — J449 Chronic obstructive pulmonary disease, unspecified: Secondary | ICD-10-CM | POA: Insufficient documentation

## 2011-03-04 DIAGNOSIS — E871 Hypo-osmolality and hyponatremia: Secondary | ICD-10-CM | POA: Insufficient documentation

## 2011-03-04 DIAGNOSIS — G8929 Other chronic pain: Secondary | ICD-10-CM | POA: Insufficient documentation

## 2011-03-04 DIAGNOSIS — E662 Morbid (severe) obesity with alveolar hypoventilation: Secondary | ICD-10-CM | POA: Insufficient documentation

## 2011-03-04 DIAGNOSIS — K219 Gastro-esophageal reflux disease without esophagitis: Secondary | ICD-10-CM | POA: Insufficient documentation

## 2011-03-04 DIAGNOSIS — E1142 Type 2 diabetes mellitus with diabetic polyneuropathy: Secondary | ICD-10-CM | POA: Insufficient documentation

## 2011-03-04 DIAGNOSIS — Z96659 Presence of unspecified artificial knee joint: Secondary | ICD-10-CM | POA: Insufficient documentation

## 2011-03-04 DIAGNOSIS — I872 Venous insufficiency (chronic) (peripheral): Principal | ICD-10-CM | POA: Insufficient documentation

## 2011-03-04 DIAGNOSIS — K5900 Constipation, unspecified: Secondary | ICD-10-CM | POA: Insufficient documentation

## 2011-03-04 DIAGNOSIS — F17200 Nicotine dependence, unspecified, uncomplicated: Secondary | ICD-10-CM | POA: Insufficient documentation

## 2011-03-04 DIAGNOSIS — E1149 Type 2 diabetes mellitus with other diabetic neurological complication: Secondary | ICD-10-CM | POA: Insufficient documentation

## 2011-03-04 DIAGNOSIS — R0602 Shortness of breath: Secondary | ICD-10-CM | POA: Insufficient documentation

## 2011-03-04 LAB — COMPREHENSIVE METABOLIC PANEL
ALT: 26 U/L (ref 0–35)
AST: 36 U/L (ref 0–37)
Albumin: 2.9 g/dL — ABNORMAL LOW (ref 3.5–5.2)
CO2: 30 mEq/L (ref 19–32)
Calcium: 8.7 mg/dL (ref 8.4–10.5)
Creatinine, Ser: 0.61 mg/dL (ref 0.50–1.10)
Sodium: 133 mEq/L — ABNORMAL LOW (ref 135–145)
Total Protein: 7.4 g/dL (ref 6.0–8.3)

## 2011-03-04 LAB — CBC
Hemoglobin: 10.6 g/dL — ABNORMAL LOW (ref 12.0–15.0)
MCH: 26.1 pg (ref 26.0–34.0)
MCHC: 31.9 g/dL (ref 30.0–36.0)
Platelets: 281 10*3/uL (ref 150–400)
RDW: 16.8 % — ABNORMAL HIGH (ref 11.5–15.5)

## 2011-03-04 LAB — BLOOD GAS, ARTERIAL
Bicarbonate: 30.6 mEq/L — ABNORMAL HIGH (ref 20.0–24.0)
FIO2: 0.21 %
Patient temperature: 98.6
TCO2: 28.3 mmol/L (ref 0–100)
pCO2 arterial: 49.5 mmHg — ABNORMAL HIGH (ref 35.0–45.0)
pH, Arterial: 7.409 — ABNORMAL HIGH (ref 7.350–7.400)

## 2011-03-04 LAB — PRO B NATRIURETIC PEPTIDE: Pro B Natriuretic peptide (BNP): 452.6 pg/mL — ABNORMAL HIGH (ref 0–125)

## 2011-03-04 LAB — DIFFERENTIAL
Basophils Absolute: 0 10*3/uL (ref 0.0–0.1)
Basophils Relative: 0 % (ref 0–1)
Eosinophils Absolute: 0.1 10*3/uL (ref 0.0–0.7)
Monocytes Absolute: 0.9 10*3/uL (ref 0.1–1.0)
Neutro Abs: 4.6 10*3/uL (ref 1.7–7.7)

## 2011-03-04 LAB — GLUCOSE, CAPILLARY

## 2011-03-04 LAB — D-DIMER, QUANTITATIVE: D-Dimer, Quant: 1.68 ug/mL-FEU — ABNORMAL HIGH (ref 0.00–0.48)

## 2011-03-04 MED ORDER — IOHEXOL 350 MG/ML SOLN
100.0000 mL | Freq: Once | INTRAVENOUS | Status: AC | PRN
  Administered 2011-03-04: 100 mL via INTRAVENOUS

## 2011-03-05 LAB — COMPREHENSIVE METABOLIC PANEL
AST: 25 U/L (ref 0–37)
Albumin: 2.4 g/dL — ABNORMAL LOW (ref 3.5–5.2)
Chloride: 103 mEq/L (ref 96–112)
Creatinine, Ser: 0.48 mg/dL — ABNORMAL LOW (ref 0.50–1.10)
Potassium: 3.6 mEq/L (ref 3.5–5.1)
Sodium: 138 mEq/L (ref 135–145)
Total Bilirubin: 0.3 mg/dL (ref 0.3–1.2)

## 2011-03-05 LAB — DIFFERENTIAL
Basophils Absolute: 0 10*3/uL (ref 0.0–0.1)
Eosinophils Absolute: 0.1 10*3/uL (ref 0.0–0.7)
Eosinophils Relative: 1 % (ref 0–5)
Lymphocytes Relative: 41 % (ref 12–46)
Lymphs Abs: 3.2 10*3/uL (ref 0.7–4.0)
Monocytes Absolute: 0.6 10*3/uL (ref 0.1–1.0)

## 2011-03-05 LAB — HEMOGLOBIN A1C
Hgb A1c MFr Bld: 8.6 % — ABNORMAL HIGH (ref <5.7)
Mean Plasma Glucose: 200 mg/dL — ABNORMAL HIGH (ref <117)

## 2011-03-05 LAB — VITAMIN B12: Vitamin B-12: 318 pg/mL (ref 211–911)

## 2011-03-05 LAB — GLUCOSE, CAPILLARY
Glucose-Capillary: 167 mg/dL — ABNORMAL HIGH (ref 70–99)
Glucose-Capillary: 149 mg/dL — ABNORMAL HIGH (ref 70–99)
Glucose-Capillary: 166 mg/dL — ABNORMAL HIGH (ref 70–99)

## 2011-03-05 LAB — URINE CULTURE
Culture  Setup Time: 201206191022
Culture: NO GROWTH

## 2011-03-05 LAB — FERRITIN: Ferritin: 57 ng/mL (ref 10–291)

## 2011-03-05 LAB — CBC
HCT: 32.7 % — ABNORMAL LOW (ref 36.0–46.0)
MCHC: 30.9 g/dL (ref 30.0–36.0)
MCV: 82.4 fL (ref 78.0–100.0)
Platelets: 284 10*3/uL (ref 150–400)
RDW: 16.9 % — ABNORMAL HIGH (ref 11.5–15.5)
WBC: 8 10*3/uL (ref 4.0–10.5)

## 2011-03-05 LAB — LIPID PANEL
Cholesterol: 101 mg/dL (ref 0–200)
HDL: 29 mg/dL — ABNORMAL LOW (ref >39)
Total CHOL/HDL Ratio: 3.5 RATIO

## 2011-03-05 LAB — PHOSPHORUS: Phosphorus: 3.7 mg/dL (ref 2.3–4.6)

## 2011-03-06 LAB — GLUCOSE, CAPILLARY: Glucose-Capillary: 187 mg/dL — ABNORMAL HIGH (ref 70–99)

## 2011-03-06 LAB — BASIC METABOLIC PANEL
BUN: 11 mg/dL (ref 6–23)
Chloride: 101 mEq/L (ref 96–112)
Glucose, Bld: 105 mg/dL — ABNORMAL HIGH (ref 70–99)
Potassium: 4.1 mEq/L (ref 3.5–5.1)

## 2011-03-06 LAB — IRON AND TIBC
Iron: 42 ug/dL (ref 42–135)
Saturation Ratios: 12 % — ABNORMAL LOW (ref 20–55)
UIBC: 296 ug/dL

## 2011-03-06 LAB — FERRITIN: Ferritin: 54 ng/mL (ref 10–291)

## 2011-03-07 NOTE — Discharge Summary (Signed)
NAMEHAYLEY, [name] NO.:  [id number]  MEDICAL RECORD NO.:  [id number]  LOCATION:  [id number]                         FACILITY:  [facility]  PHYSICIAN:  Jacquelynn Cree, M.D.   DATE OF BIRTH:  18-Apr-1966  DATE OF ADMISSION:  03/04/2011 DATE OF DISCHARGE:  03/06/2011                              DISCHARGE SUMMARY   PRIMARY CARE PHYSICIAN:  Tanya D. Hassell Done, M.D. with [facility].  DISCHARGE DIAGNOSES: 1. Venous stasis dermatitis with venous insufficiency and lower     extremity swelling. 2. Poorly controlled type 2 diabetes. 3. Diabetic neuropathy. 4. Isolated alkaline phosphatase elevation, thought to be secondary to     fatty liver. 5. Chronic respiratory failure secondary to obstructive sleep apnea     and obesity hypoventilation syndrome. 6. Normocytic anemia. 7. Tobacco abuse. 8. Gastroesophageal reflux disease. 9. Chronic back pain. 10.Constipation. 11.Morbid obesity.  CONSULTATIONS: 1. Registered dietitian. 2. Respiratory therapy for CPAP titration. 3. Wound care nurse.  BRIEF ADMISSION HPI:  The patient is a 45 year old female who suffers with morbid obesity and presented to the hospital with a chief complaint of a 2-week history of worsening bilateral lower extremity pain and swelling.  Upon initial evaluation in the emergency department, the patient was felt to be at high risk for DVT and therefore was referred to the hospitalist service for further evaluation and treatment.  For full details, please see the dictated report done by Dr. Karleen Hampshire.  PROCEDURES AND DIAGNOSTIC STUDIES: 1. Chest x-ray on March 04, 2011 showed no acute disease. 2. CT angiogram of the chest on March 04, 2011 showed no evidence of     acute pulmonary embolism.  No acute abnormality of the thoracic     aorta.  Study was limited by the patient's body habitus and     respiratory motion. 3. Right foot film on March 04, 2011 showed no acute bony pathology,     chronic  changes. 4. Left tibia-fibula films on March 04, 2011 showed no fracture or     subluxation.  Stable left knee prosthesis. 5. Right tibia-fibula films on March 04, 2011 showed findings     consistent with loosening of the tibial component of the right knee     prosthesis with medial tilt of the tibial component of prosthesis.     No abnormalities otherwise involving the tibia or fibula. 6. Left foot films on March 04, 2011 showed no acute or focal     abnormality.  Mild generalized osteopenia.  Flattening of the     plantar arch and moderate degenerative change in the mid foot.     Prominent os trigonum. 7. Lower extremity Dopplers on March 04, 2011 showed no evidence of DVT     or superficial thrombosis involving either leg.  No evidence of     Baker cysts.  DISCHARGE LABORATORY VALUES:  Sodium was 136, potassium 4.1, chloride 101, bicarb 29, BUN 11, creatinine 0.51, glucose 105, calcium 8.5. Hemoglobin A1c was 8.6.  TSH was 2.402.  An anemia panel showed a low iron at 27, normal total iron binding capacity 292, 9% saturation.  B12 was 318, serum folate 13.4, ferritin 57.  Urine cultures were  negative. Lipids showed a cholesterol of 101, triglycerides 60, HDL 29, LDL 60. Alkaline phosphatase was 178 with all other liver function studies being within normal limits.  Pro BNP was mildly elevated at 464.  HOSPITAL COURSE BY PROBLEM: 1. Lower-extremity pain and swelling, this was felt to be     multifactorial with venous stasis and venous insufficiency being     the largest contributing factor.  DVT was ruled out.  She does have     known diabetic neuropathy and this was addressed by increasing her     Lyrica dose.  She was put on Lasix to help decompress the fluid in     her legs and had good response.  The patient likely has some     element of right heart failure given her large body habitus and     known obstructive sleep apnea.  Would recommend outpatient 2-     dimensional  echocardiogram and compliance with CPAP to prevent     further problems.  The patient will be discharged on daily Lasix     therapy and had good diuresis on a 40 mg dose.  She also has been     advised that she may double up on the dose if she experiences any     problems with worsening swelling. 2. Poorly controlled type 2 diabetes:  The patient's hemoglobin A1c of     8.6% indicates that she needs better outpatient control.  We have     asked the dietitian to speak with her about appropriate dietary     interventions to help with her glycemic control and we will leave     it up to her primary care physician regarding adjustment of her     home medications to achieve better euglycemia. 3. Diabetic neuropathy:  The patient's Lyrica dose was increased to     address this. 4. Isolated alkaline phosphatase elevation:  This was felt to be most     likely due to fatty liver infiltration.  The patient is     asymptomatic and no further diagnostic evaluation was felt to be     needed at this time. 5. Chronic respiratory failure secondary to obstructive sleep     apnea/obesity hypoventilation syndrome:  Again, the patient likely     has an element of right heart failure given her chronic respiratory     failure and may benefit from outpatient 2-dimensional     echocardiogram.  She was seen by the respiratory therapist to apply     CPAP since she has been noncompliant with this in the outpatient     setting.  She was unable to tolerate the CPAP machine but has been     encouraged to use this and we will set her up with a new CPAP     machine at home since she says her's has been broken. 6. Normocytic anemia:  The patient does have some element of iron     deficiency but most likely this is anemia of chronic disease.  Her     hemoglobin and hematocrit was stable throughout her hospital stay. 7. Tobacco abuse:  The patient was extensively counseled and put on a     nicotine patch.  She does  indicate that she has a prescription for     Chantix at home and is willing to try this to help with smoking     cessation efforts. 8. Gastroesophageal reflux disease:  The patient was  maintained on     proton pump inhibitor therapy. 9. Chronic back pain:  The patient was maintained on her usual home     medications. 10.Constipation:  The patient was given sorbitol q.2h. until her     bowels moved. 11.Morbid obesity:  Again, the patient was counseled regarding     importance of weight loss, seen by the dietitian to help with     dietary intervention.  Her thyroid function was checked and found     to be normal.  DISPOSITION:  The patient is medically stable and be discharged home.  DISCHARGE DIET:  Diabetic, low salt.  DISCHARGE INSTRUCTIONS:  Follow up with Dr. Hassell Done in 2 to 3 weeks. Comply with CPAP machine.  Time spent coordinating care for discharge and discharge instructions including face-to-face time equals approximately 35 minutes.     Jacquelynn Cree, M.D.     CR/MEDQ  D:  03/06/2011  T:  03/06/2011  Job:  [id number]  cc:   Tanya D. Hassell Done, M.D. [id number]  Electronically Signed by Jacquelynn Cree M.D. on 03/07/2011 07:13:50 AM

## 2011-03-10 NOTE — H&P (Signed)
[name], PICCININI NO.:  [id number]  MEDICAL RECORD NO.:  [id number]  LOCATION:  WLED                         FACILITY:  [facility]  PHYSICIAN:  Hosie Poisson, MD       DATE OF BIRTH:  1966/02/27  DATE OF ADMISSION:  03/04/2011 DATE OF DISCHARGE:                             HISTORY & PHYSICAL   PRIMARY CARE PHYSICIAN:  Dr. Hassell Done.  CHIEF COMPLAINT:  Worsening bilateral lower extremity pain and swelling over the last 2 weeks.  HISTORY OF PRESENT ILLNESS:  This is a 45 year old African American lady with a past medical history of depression, morbid obesity, obstructive sleep apnea, diabetes, degenerative disk disease and severe arthritis of bilateral knees, came in complaining of bilateral lower extremity pain, burning pain, and worsening of bilateral lower extremity swelling over the last 2 weeks.  The patient denies any shortness of breath, chest pain, fever, cough, or syncope.  The patient denies any nausea, vomiting, abdominal pain, diarrhea, or headaches.  She denies any blurry vision.  She has generalized weakness and tired and she reports, she has not been sleeping for the last 5 days because of the bilateral pain. She saw Dr. Hassell Done few weeks ago and was given a prescription for a cream and to elevate the legs, which appeared to not have improved her symptoms at all.  While in the ED, the patient desaturated to 80% to 77% on room air when she is asleep and when she is awake her saturation comes up to 92% to 95% on room air.  She is put on 2 L of oxygen as of now and as per her daughter, who is at the bedside and who lives with her state, the patient has been on sleep CPAP at home by the year ago, but the CPAP machine was broken and she has not been using the CPAP over the last few months for sleep apnea.  ABG was obtained while she was in the ED.  She was found to have pO2 of 58% and oxygen saturation of 89% on room air.  REVIEW OF SYSTEMS:  See HPI,  otherwise negative.  PAST MEDICAL HISTORY:  History of hypertension, bilateral osteoarthritis of the knee, depression, GERD, obstructive sleep apnea requiring CPAP, morbid obesity, multiple back surgeries, diabetes, degenerative cervical disk, and depression.  PAST SURGICAL HISTORY:  Multiple back surgeries and neck surgeries, right total knee arthroplasty, and left total knee arthroplasty.  FAMILY HISTORY:  Not significant.  SOCIAL HISTORY:  The patient lives alone with the daughter.  Smokes a pack of cigarettes a day.  Denies EtOH.  Denies any recreational drug abuse.  HOME MEDICATIONS:  The patient is on Lantus, Cymbalta, NovoLog, oxycodone, Xanax, and Lyrica.  Please see medical record for detailed medications and their doses.  ALLERGIES:  No known drug allergies.  PHYSICAL EXAMINATION:  VITAL SIGNS:  Temperature of 98.2, pulse of 95, blood pressure 117/62, respiratory rate 22, saturating 92% to 95% on room air and desaturated to 88% on room air while she is asleep. GENERAL:  She is very sleepy and she attributes that to not taking over the last 5 days, but she is arousable.  She is oriented x3.  Gives appropriate answers. HEENT:  Pupils are reacting to light.  No scleral icterus.  Moist mucous membranes. NECK:  JVD cannot be appreciated. CARDIOVASCULAR:  S1 and S2 heard regular rate and rhythm. RESPIRATORY:  Chest clear to auscultation bilaterally.  No wheezes or rhonchi. ABDOMEN:  Obese, soft.  Organomegaly cannot be felt.  Nontender and nondistended.  Bowel sounds are heard. EXTREMITIES:  Bilateral chronic lymphedema present.  Pulses could not be appreciated from the edema.  She has multiple blisters on bilateral feet and very tender to touch. NEUROLOGICAL:  The patient has burning sensation in the lower extremity. No sensory abnormalities.  Able to move all of her extremities.  No new focal deficits at this time.  EKG:  EKG, normal sinus rhythm, prolonged QT  interval.  PERTINENT LABORATORY DATA:  CBC done this morning shows significant for hemoglobin of 10.6, hematocrit of 33.2.  Pro-BNP of 452.  Comprehensive metabolic panel significant for a sodium of 133, glucose of 149 and isolated elevation of alkaline phosphatase and albumin of 2.9.  Blood gas shows a pH of 7.4, pCO2 of 49, pO2 of 58, bicarbonate of 30, and oxygen saturation 89.5% on room air.  RADIOLOGY:  The patient had a chest x-ray, which shows no acute disease.  ASSESSMENT AND PLAN:  This is a 45 year old lady with the history of diabetes, being admitted for worsening intractable lower extremity pain and swelling.  The patient is being admitted for symptomatic control. She had the bilateral venous duplex of the lower extremity to look for DVT, which was negative.  I think her lower extremity pain is the neuropathy pain from the diabetes.  The patient is already on Lyrica, we will increase the dose of Lyrica while she is being admitted.  We will also put her on pain medications, IV Dilaudid as needed.  We will also get x-rays of the foot and the lower extremities to see if she has any acute going on.  We will get a wound care lady to get the bilateral foot blisters and go by their recommendations.  History of hypoxia.  The patient desaturated to 77% to 8% on room air. At one point, I think it is most likely secondary to the chronic obstructive sleep apnea, but we will definitely rule out PE.  The patient does not complain of any chest pain or shortness of breath.  So, we will get a D-dimer.  If it is positive, we will get a CT angiogram of the chest to rule out PE.  But she is very low risk at this time.  Diabetes.  The patient is on Lantus.  We will continue with her home dose of Lantus and sliding scale insulin.  We will get a hemoglobin A1c too.  Obstructive sleep apnea.  Continue with CPAP while she is sleeping. Respiratory therapist already aware.  Acid  reflux/gastroesophageal reflux disease.  The patient is not on any PPI.  So, at this time, we will start her on low-dose PPI 20 mg daily.  For deep venous thrombosis prophylaxis, subcutaneous Lovenox.  For anemia, normocytic.  We will get an anemia profile in the morning.  Mild hyponatremia, most likely from slight dehydration.  We will repeat electrolytes in the morning.  The patient is full code.          ______________________________ Hosie Poisson, MD     VA/MEDQ  D:  03/04/2011  T:  03/04/2011  Job:  [id number]  Electronically Signed by Hosie Poisson MD on 03/10/2011 [id number]  AM

## 2011-04-10 ENCOUNTER — Inpatient Hospital Stay (HOSPITAL_COMMUNITY)
Admission: EM | Admit: 2011-04-10 | Discharge: 2011-04-15 | DRG: 300 | Disposition: A | Discharge status: Home Health | Payer: Medicare Other | Attending: Internal Medicine | Admitting: Internal Medicine

## 2011-04-10 DIAGNOSIS — L03039 Cellulitis of unspecified toe: Secondary | ICD-10-CM | POA: Diagnosis present

## 2011-04-10 DIAGNOSIS — L97509 Non-pressure chronic ulcer of other part of unspecified foot with unspecified severity: Secondary | ICD-10-CM | POA: Diagnosis present

## 2011-04-10 DIAGNOSIS — Z794 Long term (current) use of insulin: Secondary | ICD-10-CM

## 2011-04-10 DIAGNOSIS — K5900 Constipation, unspecified: Secondary | ICD-10-CM | POA: Diagnosis not present

## 2011-04-10 DIAGNOSIS — D649 Anemia, unspecified: Secondary | ICD-10-CM | POA: Diagnosis present

## 2011-04-10 DIAGNOSIS — Z9119 Patient's noncompliance with other medical treatment and regimen: Secondary | ICD-10-CM

## 2011-04-10 DIAGNOSIS — M7989 Other specified soft tissue disorders: Secondary | ICD-10-CM

## 2011-04-10 DIAGNOSIS — M12869 Other specific arthropathies, not elsewhere classified, unspecified knee: Secondary | ICD-10-CM | POA: Diagnosis present

## 2011-04-10 DIAGNOSIS — E1142 Type 2 diabetes mellitus with diabetic polyneuropathy: Secondary | ICD-10-CM | POA: Diagnosis present

## 2011-04-10 DIAGNOSIS — I872 Venous insufficiency (chronic) (peripheral): Principal | ICD-10-CM | POA: Diagnosis present

## 2011-04-10 DIAGNOSIS — K7689 Other specified diseases of liver: Secondary | ICD-10-CM | POA: Diagnosis present

## 2011-04-10 DIAGNOSIS — Z6841 Body Mass Index (BMI) 40.0 and over, adult: Secondary | ICD-10-CM

## 2011-04-10 DIAGNOSIS — I10 Essential (primary) hypertension: Secondary | ICD-10-CM | POA: Diagnosis not present

## 2011-04-10 DIAGNOSIS — G8929 Other chronic pain: Secondary | ICD-10-CM | POA: Diagnosis present

## 2011-04-10 DIAGNOSIS — F17200 Nicotine dependence, unspecified, uncomplicated: Secondary | ICD-10-CM | POA: Diagnosis present

## 2011-04-10 DIAGNOSIS — J9610 Chronic respiratory failure, unspecified whether with hypoxia or hypercapnia: Secondary | ICD-10-CM | POA: Diagnosis present

## 2011-04-10 DIAGNOSIS — Z79899 Other long term (current) drug therapy: Secondary | ICD-10-CM

## 2011-04-10 DIAGNOSIS — M549 Dorsalgia, unspecified: Secondary | ICD-10-CM | POA: Diagnosis present

## 2011-04-10 DIAGNOSIS — I890 Lymphedema, not elsewhere classified: Secondary | ICD-10-CM | POA: Diagnosis present

## 2011-04-10 DIAGNOSIS — L02619 Cutaneous abscess of unspecified foot: Secondary | ICD-10-CM | POA: Diagnosis present

## 2011-04-10 DIAGNOSIS — F329 Major depressive disorder, single episode, unspecified: Secondary | ICD-10-CM | POA: Diagnosis present

## 2011-04-10 DIAGNOSIS — Z91199 Patient's noncompliance with other medical treatment and regimen due to unspecified reason: Secondary | ICD-10-CM

## 2011-04-10 DIAGNOSIS — F3289 Other specified depressive episodes: Secondary | ICD-10-CM | POA: Diagnosis present

## 2011-04-10 DIAGNOSIS — E1149 Type 2 diabetes mellitus with other diabetic neurological complication: Secondary | ICD-10-CM | POA: Diagnosis present

## 2011-04-10 DIAGNOSIS — G4733 Obstructive sleep apnea (adult) (pediatric): Secondary | ICD-10-CM | POA: Diagnosis present

## 2011-04-10 DIAGNOSIS — E662 Morbid (severe) obesity with alveolar hypoventilation: Secondary | ICD-10-CM | POA: Diagnosis present

## 2011-04-10 DIAGNOSIS — K219 Gastro-esophageal reflux disease without esophagitis: Secondary | ICD-10-CM | POA: Diagnosis present

## 2011-04-10 LAB — BASIC METABOLIC PANEL
BUN: 7 mg/dL (ref 6–23)
CO2: 30 mEq/L (ref 19–32)
Chloride: 101 mEq/L (ref 96–112)
GFR calc Af Amer: >60 mL/min (ref >60)
Glucose, Bld: 236 mg/dL — ABNORMAL HIGH (ref 70–99)
Potassium: 3.3 mEq/L — ABNORMAL LOW (ref 3.5–5.1)

## 2011-04-10 LAB — CBC
HCT: 31.7 % — ABNORMAL LOW (ref 36.0–46.0)
Hemoglobin: 9.9 g/dL — ABNORMAL LOW (ref 12.0–15.0)
MCV: 82.8 fL (ref 78.0–100.0)
RBC: 3.83 MIL/uL — ABNORMAL LOW (ref 3.87–5.11)
RDW: 15.9 % — ABNORMAL HIGH (ref 11.5–15.5)
WBC: 6.6 10*3/uL (ref 4.0–10.5)

## 2011-04-10 LAB — DIFFERENTIAL
Basophils Absolute: 0 10*3/uL (ref 0.0–0.1)
Basophils Relative: 1 % (ref 0–1)
Eosinophils Relative: 2 % (ref 0–5)
Lymphocytes Relative: 27 % (ref 12–46)
Neutro Abs: 4.1 10*3/uL (ref 1.7–7.7)

## 2011-04-11 LAB — BASIC METABOLIC PANEL
CO2: 30 mEq/L (ref 19–32)
Chloride: 103 mEq/L (ref 96–112)
Creatinine, Ser: <0.47 mg/dL — ABNORMAL LOW (ref 0.50–1.10)
Glucose, Bld: 157 mg/dL — ABNORMAL HIGH (ref 70–99)

## 2011-04-11 LAB — GLUCOSE, CAPILLARY
Glucose-Capillary: 133 mg/dL — ABNORMAL HIGH (ref 70–99)
Glucose-Capillary: 194 mg/dL — ABNORMAL HIGH (ref 70–99)

## 2011-04-12 LAB — GLUCOSE, CAPILLARY
Glucose-Capillary: 139 mg/dL — ABNORMAL HIGH (ref 70–99)
Glucose-Capillary: 169 mg/dL — ABNORMAL HIGH (ref 70–99)

## 2011-04-13 LAB — CBC
HCT: 34.7 % — ABNORMAL LOW (ref 36.0–46.0)
MCHC: 32.3 g/dL (ref 30.0–36.0)
MCV: 82.4 fL (ref 78.0–100.0)
RDW: 16.1 % — ABNORMAL HIGH (ref 11.5–15.5)

## 2011-04-13 LAB — BASIC METABOLIC PANEL
BUN: 13 mg/dL (ref 6–23)
Creatinine, Ser: 0.58 mg/dL (ref 0.50–1.10)
GFR calc Af Amer: >60 mL/min (ref >60)
GFR calc non Af Amer: >60 mL/min (ref >60)

## 2011-04-13 LAB — GLUCOSE, CAPILLARY: Glucose-Capillary: 126 mg/dL — ABNORMAL HIGH (ref 70–99)

## 2011-04-14 LAB — GLUCOSE, CAPILLARY
Glucose-Capillary: 202 mg/dL — ABNORMAL HIGH (ref 70–99)
Glucose-Capillary: 161 mg/dL — ABNORMAL HIGH (ref 70–99)

## 2011-04-14 LAB — BASIC METABOLIC PANEL
GFR calc Af Amer: >60 mL/min (ref >60)
GFR calc non Af Amer: >60 mL/min (ref >60)
Potassium: 4.1 mEq/L (ref 3.5–5.1)
Sodium: 134 mEq/L — ABNORMAL LOW (ref 135–145)

## 2011-04-16 LAB — CULTURE, BLOOD (ROUTINE X 2)
Culture  Setup Time: 201207262150
Culture: NO GROWTH

## 2011-04-18 NOTE — H&P (Signed)
NAMECARICE, CONEY NO.:  [id number]  MEDICAL RECORD NO.:  [id number]  LOCATION:  [id number]                         FACILITY:  [facility]  PHYSICIAN:  Derrill Kay, MD       DATE OF BIRTH:  12-19-1965  DATE OF ADMISSION:  04/10/2011 DATE OF DISCHARGE:                             HISTORY & PHYSICAL   CHIEF COMPLAINT:  Lower extremity swelling.  HISTORY OF PRESENT ILLNESS:  Ms. Brandley is a 45 year old African-American female with morbid obesity, chronic lower extremity venous stasis changes with chronic lower extremity edema and swelling, poorly controlled diabetes, diabetic neuropathy who presents to the emergency department because of worsening lower extremity edema.  She recently got some new diabetic shoes and says diabetic shoes have caused sores on her left lower extremity on the upper aspect of her third and fourth toes. She has got some mild abrasions there and she is also told that she had some cellulitis, possibly cellulitis in her right lower extremity.  She feels that they have been more swollen than normal and they have been painful because of the swelling and the pressure that is present.  She denies any fevers at home.  ED was concerned she had DVT, however, her bilateral lower extremities preliminary report shows no evidence of DVT bilaterally.  She really has not noticed they are red, she just feels like they are hot but she states that they always feels warm particularly when she gets increased swelling.  She denies any shortness of breath.  Denies any chest pain.  She has obstructive sleep apnea, supposed to be wearing CPAP at night but she is noncompliant with that.  REVIEW OF SYSTEMS:  Otherwise negative.  PAST MEDICAL HISTORY: 1. Again venous stasis dermatitis with chronic venous insufficiency in     her lower extremities. 2. Poorly controlled type 2 diabetes. 3. Morbid obesity. 4. Fatty liver. 5. Chronic respiratory failure secondary to  obstructive sleep apnea,     noncompliant with CPAP. 6. Normocytic anemia. 7. Tobacco abuse. 8. GERD. 9. Chronic back pain. 10.Constipation. 11.Severe bilateral knee arthritis. 12.History of multiple back surgeries. 13.History of depression.  SOCIAL HISTORY:  She smokes a pack a day.  Denies any alcohol use or any other drug use.  MEDICATIONS:  Per ED record; Lyrica, Cymbalta, oxycodone, OxyContin, soma, Xanax, Lantus and NovoLog.  The patient also says she takes Lasix, however, that is not on her list.  ALLERGIES:  None.  PHYSICAL EXAMINATION:  VITAL SIGNS:  Temperature is 98.5, blood pressure 116/74, pulse 84, respirations 20, 98% O2 sats on room air. GENERAL:  Alert and oriented x4.  No apparent distress, friendly, morbidly obese. HEENT:  Extraocular movements intact.  Pupils equal and reactive to light.  Oropharynx clear.  Mucous membranes moist. NECK:  No JVD, no carotid bruits: COR:  Regular rate and rhythm without murmurs or gallops. CHEST:  Clear to auscultation bilaterally.  No wheezes, rhonchi or rales. ABDOMEN:  Soft, nontender, nondistended.  Positive bowel sounds. EXTREMITIES:  No clubbing or cyanosis.  She has got 2+ pitting edema of bilateral lower extremities.  She has had chronic venous stasis changes. It is very hard to tell whether or not  she has got cellulitis.  Her right lower extremity might have some mild cellulitis, again hard to determine if this is from chronic venous stasis changes or true cellulitis.  Pulses are intact bilaterally, lower extremities. SKIN:  Otherwise no rashes.  She has got abrasions to her toes on her left lower foot. PSYCH:  Normal affect. NEURO:  No focal neurological deficits.  LABORATORY DATA:  BUN and creatinine are normal.  Glucose 236, potassium is 3.3.  White count is normal.  Hemoglobin is 9.9.  Preliminary report, bilateral lower extremity ultrasound for DVT is negative.  ASSESSMENT/PLAN:  This is a 45 year old  female with worsening lower extremity edema with questionable cellulitis. 1. Questionable cellulitis in the setting of chronic venous stasis     insufficiency with worsening lower extremity edema and morbid     obesity.  She says she is compliant with her Lasix.  I am going to     place her on Lasix 40 mg IV q.12 h and empirically cover her with     Levaquin for her possible cellulitis of her lower extremities.  Her     ultrasound is negative for DVT, preliminary report.  We will need     to follow up on final report.  She has had no respiratory symptoms     at this time to suggest any evidence of heart failure. 2. We will obtain a wound care consult for her lower extremities. 3. Diabetes.  Continue her home insulin regimen. 4. Morbid obesity.  I am going to obtain a diet counseling. 5. Tobacco abuse cessation will also be put in. 6. Clarify home medications.  I have written order for them to call     with clarification of her home medications to me tonight.  The     patient is full code.  Further recommendation pending overall     hospital course.          ______________________________ Derrill Kay, MD     RD/MEDQ  D:  04/10/2011  T:  04/10/2011  Job:  [id number]  Electronically Signed by Derrill Kay MD on 04/18/2011 12:11:09 PM

## 2011-05-04 NOTE — Discharge Summary (Signed)
NAMEANNICE, NIBBE NO.:  [id number]  MEDICAL RECORD NO.:  [id number]  LOCATION:  [id number]                         FACILITY:  [facility]  PHYSICIAN:  Hosie Poisson, MD       DATE OF BIRTH:  1965-12-22  DATE OF ADMISSION:  04/10/2011 DATE OF DISCHARGE:  04/15/2011                              DISCHARGE SUMMARY   DISCHARGE DIAGNOSES: 1. Left lower extremity cellulitis. 2. Chronic venous insufficiency. 3. Chronic lymphedema of the lower extremities. 4. Uncontrolled type 2 diabetes. 5. Morbid obesity. 6. Chronic respiratory failure secondary to obstructive sleep apnea,     noncompliant with continuous positive airway pressure. 7. Normocytic anemia. 8. Hypertension. 9. Tobacco abuse. 10.Chronic back pain. 11.Constipation. 12.History of depression.  DISCHARGE MEDICATIONS: 1. Lantus 160 units twice daily. 2. Levaquin 750 mg daily. 3. Lisinopril 5 mg daily. 4. Cymbalta 60 mg twice daily. 5. Hydroxyprogesterone 150 mg intramuscular once every 3 months. 6. Lasix 40 mg 1 tablet as needed. 7. Lyrica 100 mg twice daily. 8. NovoLog sliding scale as recommended. 9. Oxycodone 30 mg every 4 hours. 10.OxyContin 80 mg every 12 hours. 11.Potassium chloride 1 tablet daily. 12.Prilosec 20 mg twice daily. 13.Soma 350 mg 3 times a day. 14.Xanax 1 mg 3 times a day.  CONSULTATIONS:  None.  PROCEDURE:  None.  PERTINENT LABS:  On the day of admission, the patient had a CBC done which was significant for a hemoglobin of 9.9, platelets were within normal limits.  Basic metabolic panel significant for potassium of 3.3, glucose was 236, and creatinine was 0.47.  Blood cultures have been negative, and on the day of discharge, basic metabolic panel showed a normal potassium of 4.1, glucose of 192 and sodium of 134.  RADIOLOGY:  None.  __________ None.  Venous Dopplers of the lower extremities showed no evidence of DVT.  2-D echocardiogram was done which showed normal ejection  fraction, grade I diastolic dysfunction.  BRIEF HOSPITAL COURSE BY PROBLEM: 1. Cellulitis of the left lower extremity.  This is a 45 year old lady     with history of morbid obesity, chronic lower extremity venous     insufficiency/chronic lymphedema, uncontrolled diabetes with     diabetic neuropathy, presented to the ER with worsening of the     lower extremity edema and there was found to have cellulitic     changes on the left lower foot, on the dorsal side.  1. The patient on admission, was started on Levaquin IV and was also     put on Lasix 40 mg q.12 hours over the next 24-48 hours, cellulitis     improved.  The patient's IV Levaquin was changed to p.o. Levaquin,     and she was discharged on 5 more days of Levaquin to complete the     course of antibiotic.  1. Chronic lower extremity edema secondary to chronic lymphedema     secondary to chronic venous insufficiency.  The patient was started     on IV Lasix.  She also had a 2-D echocardiogram done and it showed     grade I diastolic dysfunction with normal ventricular systolic     function.  1. Uncontrolled diabetes.  The patient's Lantus was increased from 20     units to 26 units.  1. Tobacco cessation counseling was given.  1. Hypertension was controlled.  She was started on ACE inhibitor.  PHYSICAL EXAMINATION:  VITAL SIGNS:  On the day of discharge, the patient's vitals include temperature of 98, pulse of 88, respirations 20, blood pressure of 140/80, saturating 100% on room air. GENERAL:  She is alert, afebrile, oriented x3, comfortable in no acute distress. CARDIOVASCULAR:  S1, S2 heard. RESPIRATORY EXAM:  Chest clear to auscultation bilaterally. ABDOMEN:  Soft, nontender, morbidly obese. EXTREMITIES:  Bilateral 3+ pedal edema/lymphedema.  Thick cellulitic changes of the left foot have improved.  The patient has a small Band- Aid over the left toes.  The patient at this time is hemodynamically stable for  discharge.  She has an appointment scheduled with her PCP next week to follow up.          ______________________________ Hosie Poisson, MD    VA/MEDQ  D:  04/29/2011  T:  04/29/2011  Job:  [id number]  Electronically Signed by Hosie Poisson MD on 05/04/2011 07:49:53 AM

## 2011-06-05 LAB — CBC
MCHC: 33.7
MCV: 91.1
Platelets: 270
RBC: 4.35
RDW: 14.4

## 2011-06-05 LAB — HCG, SERUM, QUALITATIVE: Preg, Serum: NEGATIVE

## 2011-06-06 LAB — CBC
Hemoglobin: 10.8 — ABNORMAL LOW
MCHC: 32.9
MCHC: 33.3
MCV: 91.7
Platelets: 210
Platelets: 221
Platelets: 245
RDW: 14.4
RDW: 14.6
WBC: 14 — ABNORMAL HIGH
WBC: 15.9 — ABNORMAL HIGH

## 2011-06-06 LAB — BASIC METABOLIC PANEL
BUN: 10
BUN: 6
BUN: 7
CO2: 25
Calcium: 8.1 — ABNORMAL LOW
Calcium: 8.2 — ABNORMAL LOW
Calcium: 8.3 — ABNORMAL LOW
Chloride: 100
Creatinine, Ser: 0.67
Creatinine, Ser: 0.67
GFR calc Af Amer: >60
GFR calc non Af Amer: >60
GFR calc non Af Amer: >60
Glucose, Bld: 195 — ABNORMAL HIGH
Glucose, Bld: 220 — ABNORMAL HIGH
Sodium: 133 — ABNORMAL LOW

## 2011-06-06 LAB — PROTIME-INR
INR: 1.1
INR: 1.2
INR: 1.7 — ABNORMAL HIGH
Prothrombin Time: 13.6
Prothrombin Time: 13.9
Prothrombin Time: 15.8 — ABNORMAL HIGH
Prothrombin Time: 20.2 — ABNORMAL HIGH

## 2011-06-13 LAB — PROTIME-INR
INR: 1.1
INR: 1.4
INR: 2.1 — ABNORMAL HIGH
INR: 2.3 — ABNORMAL HIGH
INR: 2.5 — ABNORMAL HIGH
Prothrombin Time: 14.2
Prothrombin Time: 19.6 — ABNORMAL HIGH
Prothrombin Time: 25.1 — ABNORMAL HIGH
Prothrombin Time: 27 — ABNORMAL HIGH
Prothrombin Time: 27.2 — ABNORMAL HIGH
Prothrombin Time: 28.5 — ABNORMAL HIGH

## 2011-06-13 LAB — BASIC METABOLIC PANEL
CO2: 29
Chloride: 97
Chloride: 101
Creatinine, Ser: 0.57
GFR calc Af Amer: >60
GFR calc Af Amer: >60
GFR calc non Af Amer: >60
Glucose, Bld: 195 — ABNORMAL HIGH
Potassium: 4.6
Potassium: 4
Potassium: 4.4
Sodium: 134 — ABNORMAL LOW
Sodium: 136

## 2011-06-13 LAB — COMPREHENSIVE METABOLIC PANEL
Albumin: 3.5
BUN: 6
Calcium: 9.2
Creatinine, Ser: 0.48
Total Bilirubin: 0.7
Total Protein: 6.4

## 2011-06-13 LAB — CBC
HCT: 37.3
HCT: 25.9 — ABNORMAL LOW
HCT: 27.7 — ABNORMAL LOW
Hemoglobin: 8.3 — ABNORMAL LOW
Hemoglobin: 8.6 — ABNORMAL LOW
MCHC: 33.1
MCHC: 34
MCV: 88.3
MCV: 90.6
MCV: 88.5
MCV: 90.5
Platelets: 279
RBC: 3 — ABNORMAL LOW
RBC: 2.77 — ABNORMAL LOW
RBC: 2.87 — ABNORMAL LOW
RBC: 3.06 — ABNORMAL LOW
RDW: 15.7 — ABNORMAL HIGH
WBC: 9.6
WBC: 11.9 — ABNORMAL HIGH
WBC: 12.1 — ABNORMAL HIGH

## 2011-06-20 LAB — BASIC METABOLIC PANEL
BUN: 7 mg/dL (ref 6–23)
Chloride: 103 mEq/L (ref 96–112)
GFR calc non Af Amer: >60 mL/min (ref >60)
Potassium: 3.9 mEq/L (ref 3.5–5.1)
Sodium: 136 mEq/L (ref 135–145)

## 2011-06-20 LAB — CBC
HCT: 33.4 % — ABNORMAL LOW (ref 36.0–46.0)
Hemoglobin: 10.9 g/dL — ABNORMAL LOW (ref 12.0–15.0)
MCV: 79.4 fL (ref 78.0–100.0)
Platelets: 344 10*3/uL (ref 150–400)
WBC: 7.3 10*3/uL (ref 4.0–10.5)

## 2011-06-20 LAB — DIFFERENTIAL
Basophils Relative: 0 % (ref 0–1)
Eosinophils Relative: 2 % (ref 0–5)
Lymphs Abs: 2 10*3/uL (ref 0.7–4.0)
Monocytes Absolute: 0.4 10*3/uL (ref 0.1–1.0)
Monocytes Relative: 6 % (ref 3–12)
Neutro Abs: 4.8 10*3/uL (ref 1.7–7.7)

## 2012-05-12 DIAGNOSIS — L02419 Cutaneous abscess of limb, unspecified: Secondary | ICD-10-CM | POA: Insufficient documentation

## 2012-05-12 DIAGNOSIS — I10 Essential (primary) hypertension: Secondary | ICD-10-CM | POA: Insufficient documentation

## 2012-05-12 DIAGNOSIS — F17200 Nicotine dependence, unspecified, uncomplicated: Secondary | ICD-10-CM | POA: Insufficient documentation

## 2012-05-12 DIAGNOSIS — E119 Type 2 diabetes mellitus without complications: Secondary | ICD-10-CM | POA: Insufficient documentation

## 2012-05-13 ENCOUNTER — Encounter (HOSPITAL_COMMUNITY): Payer: Self-pay | Admitting: *Deleted

## 2012-05-13 ENCOUNTER — Emergency Department (HOSPITAL_COMMUNITY)
Admission: EM | Admit: 2012-05-13 | Discharge: 2012-05-13 | Disposition: A | Discharge status: Home / Self Care | Payer: Medicare Other | Attending: Emergency Medicine | Admitting: Emergency Medicine

## 2012-05-13 DIAGNOSIS — L0291 Cutaneous abscess, unspecified: Secondary | ICD-10-CM

## 2012-05-13 HISTORY — DX: Essential (primary) hypertension: I10

## 2012-05-13 MED ORDER — SULFAMETHOXAZOLE-TRIMETHOPRIM 800-160 MG PO TABS: 1.0000 | ORAL_TABLET | Freq: Two times a day (BID) | ORAL | Status: AC

## 2012-05-13 MED ORDER — HYDROCODONE-ACETAMINOPHEN 5-325 MG PO TABS
1.0000 | ORAL_TABLET | Freq: Once | ORAL | Status: AC
  Administered 2012-05-13: 1 via ORAL
  Filled 2012-05-13: qty 1

## 2012-05-13 MED ORDER — HYDROCODONE-ACETAMINOPHEN 5-325 MG PO TABS: 1.0000 | ORAL_TABLET | ORAL | Status: AC | PRN

## 2012-05-13 NOTE — ED Provider Notes (Signed)
Medical screening examination/treatment/procedure(s) were performed by non-physician practitioner and as supervising physician I was immediately available for consultation/collaboration.  Kalman Drape, MD 05/13/12 [phone]

## 2012-05-13 NOTE — ED Provider Notes (Signed)
History     CSN: [id number]  Arrival date & time 05/12/12  2359   First MD Initiated Contact with Patient 05/13/12 0030      Chief Complaint  Patient presents with  . Abscess   HPI  Hx provided by pt.  Pt is a 46 yo AA female with PMH of DM, HTN and morbid obesity who presents with complaints of pain and nodules to upper thighs.  Pt states she believes she may have MRSA skin infection.  She was told she has hx of same in past after a surgery.  Pt has been having small bumps to skin of medial bilateral thighs for the past several months. They have been waxing and waning in size.  Over the past few days she reports increased pain and tenderness to 2 areas on left thigh.  She also reports having some bleeding and purulent drainage from one of the areas.  She denies any fever, chills or sweats.  She has not used any treatments for symptoms.     Past Medical History  Diagnosis Date  . Diabetes mellitus   . Hypertension     Past Surgical History  Procedure Date  . Spinal chord repair 2011  . Replacement total knee bilateral   . Cholecystectomy   . Carpal tunnel repair     No family history on file.  History  Substance Use Topics  . Smoking status: Current Everyday Smoker -- 0.5 packs/day    Types: Cigarettes  . Smokeless tobacco: Not on file  . Alcohol Use: No    OB History    Grav Para Term Preterm Abortions TAB SAB Ect Mult Living                  Review of Systems  Constitutional: Negative for fever and chills.  Respiratory: Negative for cough and shortness of breath.   Cardiovascular: Negative for chest pain.  Skin:       Tender nodules to medial bilateral thighs.    Allergies  Review of patient's allergies indicates no known allergies.  Home Medications  No current outpatient prescriptions on file.  BP 139/91  Pulse 109  Temp 99.2 F (37.3 C) (Oral)  Resp 24  SpO2 96%  Physical Exam  Nursing note and vitals reviewed. Constitutional: She is  oriented to person, place, and time. She appears well-developed and well-nourished. No distress.       Morbidly obese.  HENT:  Head: Normocephalic.  Cardiovascular: Normal rate and regular rhythm.   Pulmonary/Chest: Effort normal and breath sounds normal.  Neurological: She is alert and oriented to person, place, and time.  Skin: Skin is warm and dry.       Multiple small nodules to bilateral medial proximal thighs with some pealing of skin.  Largest nodule is 2cm and TTP on left thigh.  There is mild overlying erythema.  No bleeding or drainage.   Psychiatric: She has a normal mood and affect. Her behavior is normal.    ED Course  Procedures    INCISION AND DRAINAGE Performed by: Martie Lee Consent: Verbal consent obtained. Risks and benefits: risks, benefits and alternatives were discussed Type: abscess  Body area: Posterior medial left proximal thigh  Anesthesia: local infiltration  Local anesthetic: lidocaine 2% with epinephrine  Anesthetic total: 2 ml  Complexity: Simple   Drainage: Blood and purulent  Drainage amount: Small   Packing material: None   Patient tolerance: Patient tolerated the procedure well with no immediate complications.  1. Abscess       MDM  12:30AM Pt seen and evaluated.  Pt with multiple small fluctuant nodules on bilateral medial thighs.        Martie Lee, [place] 05/13/12 [phone]

## 2012-05-13 NOTE — ED Notes (Signed)
Pt has been self-tx an abscess on the back of her R leg since April.  Last month the abscess became more painful. Yesterday she felt blood running down her leg.  Hx of MRSA.

## 2012-09-28 ENCOUNTER — Other Ambulatory Visit: Payer: Self-pay | Admitting: Neurosurgery

## 2012-09-28 DIAGNOSIS — M542 Cervicalgia: Secondary | ICD-10-CM

## 2012-10-14 ENCOUNTER — Ambulatory Visit (INDEPENDENT_AMBULATORY_CARE_PROVIDER_SITE_OTHER): Payer: Medicare Other | Admitting: Emergency Medicine

## 2012-10-14 VITALS — BP 147/93 | HR 99 | Temp 98.7°F | Resp 20 | Wt 385.0 lb

## 2012-10-14 DIAGNOSIS — Z765 Malingerer [conscious simulation]: Secondary | ICD-10-CM

## 2012-10-14 DIAGNOSIS — G8929 Other chronic pain: Secondary | ICD-10-CM

## 2012-10-14 DIAGNOSIS — E119 Type 2 diabetes mellitus without complications: Secondary | ICD-10-CM

## 2012-10-14 DIAGNOSIS — F1910 Other psychoactive substance abuse, uncomplicated: Secondary | ICD-10-CM

## 2012-10-14 NOTE — Progress Notes (Signed)
[facility] [address], [place] [address] [phone]  Date:  10/14/2012   Name:  [name]   DOB:  [birth year]   MRN:  [id number]  PCP:  Grant Fontana, MD    Chief Complaint: Medication Refill   History of Present Illness:  [name] is a 47 y.o. very pleasant female patient who presents with the following:  Patient claims that her FMD, Dr Grant Fontana has lost his license and so she is unable to obtain her usual prescription for 240 oxycodone 30 mg.  She claims to be out and pending a referral at a pain management center.  In fact, she received 180 tablets on 1/16 from her neurosurgeon who has been managing her  Narcotic pain medications for the past year except for two additional prescriptions written by another provider.  She insists that she receives two prescriptions monthly to make up 240 tabs monthly.  Patient Active Problem List  Diagnosis  . OBESITY, NOS  . TOBACCO DEPENDENCE  . DEPRESSIVE DISORDER, NOS  . HYPERTENSION, BENIGN SYSTEMIC  . GASTROESOPHAGEAL REFLUX, NO ESOPHAGITIS  . ACNE  . OSTEOARTHRITIS, LOWER LEG  . APNEA, SLEEP  . CHEST PAIN    Past Medical History  Diagnosis Date  . Diabetes mellitus   . Hypertension     Past Surgical History  Procedure Date  . Spinal chord repair 2011  . Replacement total knee bilateral   . Cholecystectomy   . Carpal tunnel repair     History  Substance Use Topics  . Smoking status: Current Every Day Smoker -- 0.5 packs/day    Types: Cigarettes  . Smokeless tobacco: Not on file  . Alcohol Use: No    Family History  Problem Relation Age of Onset  . Kidney disease Mother   . Congestive Heart Failure Mother   . Hypertension Father     No Known Allergies  Medication list has been reviewed and updated.  Current Outpatient Prescriptions on File Prior to Visit  Medication Sig Dispense Refill  . ALPRAZolam (XANAX) 1 MG tablet Take 1 mg by mouth 3 (three) times daily.  Scheduled doses      . amphetamine-dextroamphetamine (ADDERALL XR) 20 MG 24 hr capsule Take 20 mg by mouth every morning.      . carisoprodol (SOMA) 350 MG tablet Take 350 mg by mouth 3 (three) times daily as needed. Spasms      . collagenase (SANTYL) ointment Apply 1 application topically daily. Apply to wound on back      . insulin aspart (NOVOLOG) 100 UNIT/ML injection Inject 2-6 Units into the skin 3 (three) times daily before meals. Per sliding scale      . insulin glargine (LANTUS) 100 UNIT/ML injection Inject 60 Units into the skin 2 (two) times daily.      Marland Kitchen lisinopril-hydrochlorothiazide (PRINZIDE,ZESTORETIC) 20-25 MG per tablet Take 1 tablet by mouth daily.      . medroxyPROGESTERone (DEPO-SUBQ PROVERA) 104 MG/0.65ML injection Inject 104 mg into the skin every 3 (three) months.      Marland Kitchen oxycodone (ROXICODONE) 30 MG immediate release tablet Take 30 mg by mouth every 4 (four) hours as needed. pain      . pregabalin (LYRICA) 150 MG capsule Take 150 mg by mouth 2 (two) times daily.        Review of Systems:  As per HPI, otherwise negative.    Physical Examination: Filed Vitals:   10/14/12 1411  BP: 147/93  Pulse: 99  Temp: 98.7 F (37.1 C)  Resp: 20   Filed Vitals:   10/14/12 1411  Weight: 385 lb (174.635 kg)   There is no height on file to calculate BMI. Ideal Body Weight:     GEN: morbidly obese, NAD, Non-toxic, Alert & Oriented x 3 HEENT: Atraumatic, Normocephalic.  Ears and Nose: No external deformity. EXTR: No clubbing/cyanosis/edema NEURO: Normal gait.  PSYCH: Normally interactive. Conversant. Not depressed or anxious appearing.  Calm demeanor.    Assessment and Plan: Narcotic seeking Chronic pain patient Morbid obesity IDDM  She was denied any medication and referred back to her neurosurgeon. I told her that drug seeking is criminal behavior and she is risking arrest.  Roselee Culver, MD

## 2012-10-22 ENCOUNTER — Ambulatory Visit
Admission: RE | Admit: 2012-10-22 | Discharge: 2012-10-22 | Disposition: A | Discharge status: Home / Self Care | Payer: Medicare Other | Source: Ambulatory Visit | Attending: Neurosurgery | Admitting: Neurosurgery

## 2012-10-22 DIAGNOSIS — M542 Cervicalgia: Secondary | ICD-10-CM

## 2012-11-09 ENCOUNTER — Encounter (HOSPITAL_COMMUNITY): Payer: Self-pay | Admitting: *Deleted

## 2012-11-09 ENCOUNTER — Emergency Department (HOSPITAL_COMMUNITY)
Admission: EM | Admit: 2012-11-09 | Discharge: 2012-11-10 | Disposition: A | Discharge status: Home / Self Care | Payer: Medicare Other | Attending: Emergency Medicine | Admitting: Emergency Medicine

## 2012-11-09 DIAGNOSIS — E119 Type 2 diabetes mellitus without complications: Secondary | ICD-10-CM | POA: Insufficient documentation

## 2012-11-09 DIAGNOSIS — Z794 Long term (current) use of insulin: Secondary | ICD-10-CM | POA: Insufficient documentation

## 2012-11-09 DIAGNOSIS — Z79899 Other long term (current) drug therapy: Secondary | ICD-10-CM | POA: Insufficient documentation

## 2012-11-09 DIAGNOSIS — L739 Follicular disorder, unspecified: Secondary | ICD-10-CM

## 2012-11-09 DIAGNOSIS — L738 Other specified follicular disorders: Secondary | ICD-10-CM | POA: Insufficient documentation

## 2012-11-09 DIAGNOSIS — I10 Essential (primary) hypertension: Secondary | ICD-10-CM | POA: Insufficient documentation

## 2012-11-09 DIAGNOSIS — F17200 Nicotine dependence, unspecified, uncomplicated: Secondary | ICD-10-CM | POA: Insufficient documentation

## 2012-11-09 NOTE — ED Notes (Signed)
The pt has lesions under both her arms and on her genitalia for 14 days with itching and pain.  Not sexually active

## 2012-11-10 MED ORDER — DOXYCYCLINE HYCLATE 100 MG PO CAPS: 100.0000 mg | ORAL_CAPSULE | Freq: Two times a day (BID) | ORAL | Status: DC

## 2012-11-10 NOTE — ED Provider Notes (Signed)
History     CSN: [id number]  Arrival date & time 11/09/12  2016   First MD Initiated Contact with Patient 11/09/12 2304      Chief Complaint  Patient presents with  . Recurrent Skin Infections    The history is provided by the patient.   patient reports recurrent infections in her axilla and groin.  She reports today she has had some increasing "bumps" in all of these areas.  She denies fevers or chills.  No abdominal pain nausea vomiting or diarrhea.  She reports no drainage from any of these areas.  She reports they're all very small.  She reports his been a recurrent issue for months.  She spoken with her primary care physician about it who said that she has MRSA.  She's only had one incision and drainage and this was a foot wound.  She otherwise is without complaints.  Symptoms are mild in severity.  Past Medical History  Diagnosis Date  . Diabetes mellitus   . Hypertension     Past Surgical History  Procedure Laterality Date  . Spinal chord repair  2011  . Replacement total knee bilateral    . Cholecystectomy    . Carpal tunnel repair      Family History  Problem Relation Age of Onset  . Kidney disease Mother   . Congestive Heart Failure Mother   . Hypertension Father     History  Substance Use Topics  . Smoking status: Current Every Day Smoker -- 0.50 packs/day    Types: Cigarettes  . Smokeless tobacco: Not on file  . Alcohol Use: No    OB History   Grav Para Term Preterm Abortions TAB SAB Ect Mult Living                  Review of Systems  All other systems reviewed and are negative.    Allergies  Acetaminophen  Home Medications   Current Outpatient Rx  Name  Route  Sig  Dispense  Refill  . ALPRAZolam (XANAX) 1 MG tablet   Oral   Take 1 mg by mouth 3 (three) times daily. Scheduled doses         . amphetamine-dextroamphetamine (ADDERALL XR) 20 MG 24 hr capsule   Oral   Take 20 mg by mouth every morning.         . carisoprodol (SOMA) 350  MG tablet   Oral   Take 350 mg by mouth 3 (three) times daily as needed. Spasms         . collagenase (SANTYL) ointment   Topical   Apply 1 application topically daily as needed. Apply to wound on back         . insulin aspart (NOVOLOG) 100 UNIT/ML injection   Subcutaneous   Inject 2-6 Units into the skin 3 (three) times daily before meals. Per sliding scale         . insulin glargine (LANTUS) 100 UNIT/ML injection   Subcutaneous   Inject 60 Units into the skin 2 (two) times daily.         Marland Kitchen lisinopril-hydrochlorothiazide (PRINZIDE,ZESTORETIC) 20-25 MG per tablet   Oral   Take 1 tablet by mouth daily as needed (for elevated blood pressure).          Marland Kitchen oxycodone (ROXICODONE) 30 MG immediate release tablet   Oral   Take 30 mg by mouth every 4 (four) hours as needed. pain         .  pregabalin (LYRICA) 150 MG capsule   Oral   Take 150 mg by mouth 2 (two) times daily.         Marland Kitchen doxycycline (VIBRAMYCIN) 100 MG capsule   Oral   Take 1 capsule (100 mg total) by mouth 2 (two) times daily.   14 capsule   0   . medroxyPROGESTERone (DEPO-SUBQ PROVERA) 104 MG/0.65ML injection   Subcutaneous   Inject 104 mg into the skin every 3 (three) months.           BP 116/80  Pulse 109  Temp(Src) 98.3 F (36.8 C) (Oral)  Resp 20  SpO2 97%  LMP 07/22/2012  Physical Exam  Nursing note and vitals reviewed. Constitutional: She is oriented to person, place, and time. She appears well-developed and well-nourished. No distress.  HENT:  Head: Normocephalic and atraumatic.  Eyes: EOM are normal.  Neck: Normal range of motion.  Cardiovascular: Normal rate, regular rhythm and normal heart sounds.   Pulmonary/Chest: Effort normal and breath sounds normal.  Abdominal: Soft. She exhibits no distension. There is no tenderness.  Musculoskeletal: Normal range of motion.  Neurological: She is alert and oriented to person, place, and time.  Skin: Skin is warm and dry.  Small areas of  folliculitis in her bilateral axilla and bilateral inguinal areas.  No focal abscess.  No surrounding erythema or drainage.  Psychiatric: She has a normal mood and affect. Judgment normal.    ED Course  Procedures (including critical care time)  Labs Reviewed - No data to display No results found.   1. Folliculitis       MDM  Patient appears to have mild folliculitis.  No focal abscess that requires incision and drainage.  Warm compresses and antibiotics.  Discharge home with PCP followup.        Hoy Morn, MD 11/10/12 [phone]

## 2012-12-09 ENCOUNTER — Ambulatory Visit: Payer: Medicare Other | Admitting: Obstetrics

## 2012-12-10 ENCOUNTER — Ambulatory Visit: Payer: Medicare Other | Admitting: Obstetrics & Gynecology

## 2013-01-02 ENCOUNTER — Inpatient Hospital Stay (HOSPITAL_COMMUNITY)
Admission: EM | Admit: 2013-01-02 | Discharge: 2013-01-03 | DRG: 603 | Disposition: A | Discharge status: Home / Self Care | Payer: Medicare Other | Attending: Internal Medicine | Admitting: Internal Medicine

## 2013-01-02 ENCOUNTER — Encounter (HOSPITAL_COMMUNITY): Payer: Self-pay | Admitting: *Deleted

## 2013-01-02 DIAGNOSIS — L03119 Cellulitis of unspecified part of limb: Secondary | ICD-10-CM | POA: Diagnosis present

## 2013-01-02 DIAGNOSIS — L0390 Cellulitis, unspecified: Secondary | ICD-10-CM

## 2013-01-02 DIAGNOSIS — F329 Major depressive disorder, single episode, unspecified: Secondary | ICD-10-CM

## 2013-01-02 DIAGNOSIS — E871 Hypo-osmolality and hyponatremia: Secondary | ICD-10-CM | POA: Diagnosis present

## 2013-01-02 DIAGNOSIS — Z6841 Body Mass Index (BMI) 40.0 and over, adult: Secondary | ICD-10-CM

## 2013-01-02 DIAGNOSIS — G4733 Obstructive sleep apnea (adult) (pediatric): Secondary | ICD-10-CM | POA: Diagnosis present

## 2013-01-02 DIAGNOSIS — E1142 Type 2 diabetes mellitus with diabetic polyneuropathy: Secondary | ICD-10-CM | POA: Diagnosis present

## 2013-01-02 DIAGNOSIS — F17200 Nicotine dependence, unspecified, uncomplicated: Secondary | ICD-10-CM | POA: Diagnosis present

## 2013-01-02 DIAGNOSIS — L02419 Cutaneous abscess of limb, unspecified: Principal | ICD-10-CM | POA: Diagnosis present

## 2013-01-02 DIAGNOSIS — Z9119 Patient's noncompliance with other medical treatment and regimen: Secondary | ICD-10-CM

## 2013-01-02 DIAGNOSIS — Z91199 Patient's noncompliance with other medical treatment and regimen due to unspecified reason: Secondary | ICD-10-CM

## 2013-01-02 DIAGNOSIS — E1149 Type 2 diabetes mellitus with other diabetic neurological complication: Secondary | ICD-10-CM | POA: Diagnosis present

## 2013-01-02 DIAGNOSIS — I798 Other disorders of arteries, arterioles and capillaries in diseases classified elsewhere: Secondary | ICD-10-CM | POA: Diagnosis present

## 2013-01-02 DIAGNOSIS — L708 Other acne: Secondary | ICD-10-CM

## 2013-01-02 DIAGNOSIS — Z794 Long term (current) use of insulin: Secondary | ICD-10-CM

## 2013-01-02 DIAGNOSIS — K219 Gastro-esophageal reflux disease without esophagitis: Secondary | ICD-10-CM

## 2013-01-02 DIAGNOSIS — I10 Essential (primary) hypertension: Secondary | ICD-10-CM | POA: Diagnosis present

## 2013-01-02 DIAGNOSIS — R079 Chest pain, unspecified: Secondary | ICD-10-CM

## 2013-01-02 DIAGNOSIS — E119 Type 2 diabetes mellitus without complications: Secondary | ICD-10-CM | POA: Diagnosis present

## 2013-01-02 DIAGNOSIS — G4730 Sleep apnea, unspecified: Secondary | ICD-10-CM

## 2013-01-02 DIAGNOSIS — E669 Obesity, unspecified: Secondary | ICD-10-CM

## 2013-01-02 DIAGNOSIS — A630 Anogenital (venereal) warts: Secondary | ICD-10-CM | POA: Diagnosis present

## 2013-01-02 DIAGNOSIS — M1710 Unilateral primary osteoarthritis, unspecified knee: Secondary | ICD-10-CM

## 2013-01-02 DIAGNOSIS — IMO0002 Reserved for concepts with insufficient information to code with codable children: Secondary | ICD-10-CM

## 2013-01-02 DIAGNOSIS — F3289 Other specified depressive episodes: Secondary | ICD-10-CM

## 2013-01-02 NOTE — ED Notes (Signed)
Pt to the ED with c/o abscess to back of left leg. Pt states the abscess has been on leg for the past two and half weeks. Pt was given abx at [place]. Pt is A&Ox4, respirations equal and unlabored, skin warm and dry

## 2013-01-03 ENCOUNTER — Encounter (HOSPITAL_COMMUNITY): Payer: Self-pay | Admitting: Internal Medicine

## 2013-01-03 DIAGNOSIS — F329 Major depressive disorder, single episode, unspecified: Secondary | ICD-10-CM

## 2013-01-03 DIAGNOSIS — M7989 Other specified soft tissue disorders: Secondary | ICD-10-CM

## 2013-01-03 DIAGNOSIS — G4730 Sleep apnea, unspecified: Secondary | ICD-10-CM

## 2013-01-03 DIAGNOSIS — L02419 Cutaneous abscess of limb, unspecified: Principal | ICD-10-CM

## 2013-01-03 DIAGNOSIS — M79609 Pain in unspecified limb: Secondary | ICD-10-CM

## 2013-01-03 DIAGNOSIS — L0390 Cellulitis, unspecified: Secondary | ICD-10-CM

## 2013-01-03 DIAGNOSIS — I10 Essential (primary) hypertension: Secondary | ICD-10-CM

## 2013-01-03 DIAGNOSIS — L03119 Cellulitis of unspecified part of limb: Secondary | ICD-10-CM | POA: Diagnosis present

## 2013-01-03 DIAGNOSIS — F17200 Nicotine dependence, unspecified, uncomplicated: Secondary | ICD-10-CM

## 2013-01-03 DIAGNOSIS — E119 Type 2 diabetes mellitus without complications: Secondary | ICD-10-CM

## 2013-01-03 LAB — CBC WITH DIFFERENTIAL/PLATELET
Basophils Relative: 0 % (ref 0–1)
Eosinophils Absolute: 0.1 10*3/uL (ref 0.0–0.7)
Eosinophils Relative: 1 % (ref 0–5)
HCT: 43.3 % (ref 36.0–46.0)
Hemoglobin: 15 g/dL (ref 12.0–15.0)
Lymphs Abs: 3.3 10*3/uL (ref 0.7–4.0)
MCH: 30.3 pg (ref 26.0–34.0)
MCHC: 34.6 g/dL (ref 30.0–36.0)
MCV: 87.5 fL (ref 78.0–100.0)
Monocytes Absolute: 0.8 10*3/uL (ref 0.1–1.0)
Monocytes Relative: 8 % (ref 3–12)
Neutrophils Relative %: 53 % (ref 43–77)
RBC: 4.95 MIL/uL (ref 3.87–5.11)

## 2013-01-03 LAB — BASIC METABOLIC PANEL
BUN: 9 mg/dL (ref 6–23)
Creatinine, Ser: 0.58 mg/dL (ref 0.50–1.10)
GFR calc Af Amer: >90 mL/min (ref >90)
GFR calc non Af Amer: >90 mL/min (ref >90)
Glucose, Bld: 399 mg/dL — ABNORMAL HIGH (ref 70–99)
Potassium: 4.4 mEq/L (ref 3.5–5.1)

## 2013-01-03 LAB — GLUCOSE, CAPILLARY
Glucose-Capillary: 285 mg/dL — ABNORMAL HIGH (ref 70–99)
Glucose-Capillary: 279 mg/dL — ABNORMAL HIGH (ref 70–99)

## 2013-01-03 LAB — HEMOGLOBIN A1C: Hgb A1c MFr Bld: 11.3 % — ABNORMAL HIGH (ref <5.7)

## 2013-01-03 MED ORDER — VANCOMYCIN HCL 10 G IV SOLR
2000.0000 mg | Freq: Once | INTRAVENOUS | Status: AC
  Administered 2013-01-03: 2000 mg via INTRAVENOUS
  Filled 2013-01-03: qty 2000

## 2013-01-03 MED ORDER — HYDRALAZINE HCL 20 MG/ML IJ SOLN: 10.0000 mg | INTRAMUSCULAR | Status: DC | PRN

## 2013-01-03 MED ORDER — OXYCODONE HCL 5 MG PO TABS: 30.0000 mg | ORAL_TABLET | ORAL | Status: DC | PRN

## 2013-01-03 MED ORDER — HYDROMORPHONE HCL PF 1 MG/ML IJ SOLN
1.0000 mg | Freq: Once | INTRAMUSCULAR | Status: AC
  Administered 2013-01-03: 1 mg via INTRAVENOUS
  Filled 2013-01-03: qty 1

## 2013-01-03 MED ORDER — CARISOPRODOL 350 MG PO TABS: 350.0000 mg | ORAL_TABLET | Freq: Three times a day (TID) | ORAL | Status: DC | PRN

## 2013-01-03 MED ORDER — ACETAMINOPHEN 650 MG RE SUPP: 650.0000 mg | Freq: Four times a day (QID) | RECTAL | Status: DC | PRN

## 2013-01-03 MED ORDER — OXYCODONE HCL 30 MG PO TABS: 30.0000 mg | ORAL_TABLET | ORAL | Status: DC | PRN

## 2013-01-03 MED ORDER — ALPRAZOLAM 1 MG PO TABS: 1.0000 mg | ORAL_TABLET | Freq: Three times a day (TID) | ORAL | Status: DC

## 2013-01-03 MED ORDER — ENOXAPARIN SODIUM 100 MG/ML ~~LOC~~ SOLN
90.0000 mg | SUBCUTANEOUS | Status: DC
  Administered 2013-01-03: 90 mg via SUBCUTANEOUS
  Filled 2013-01-03: qty 1

## 2013-01-03 MED ORDER — ALPRAZOLAM 1 MG PO TABS
1.0000 mg | ORAL_TABLET | Freq: Three times a day (TID) | ORAL | Status: DC
  Filled 2013-01-03: qty 1

## 2013-01-03 MED ORDER — ONDANSETRON HCL 4 MG/2ML IJ SOLN: 4.0000 mg | Freq: Four times a day (QID) | INTRAMUSCULAR | Status: DC | PRN

## 2013-01-03 MED ORDER — CLINDAMYCIN PHOSPHATE 600 MG/50ML IV SOLN
600.0000 mg | Freq: Once | INTRAVENOUS | Status: AC
  Administered 2013-01-03: 600 mg via INTRAVENOUS
  Filled 2013-01-03: qty 50

## 2013-01-03 MED ORDER — HYDROMORPHONE HCL 4 MG PO TABS: 4.0000 mg | ORAL_TABLET | ORAL | Status: DC | PRN

## 2013-01-03 MED ORDER — NICOTINE 14 MG/24HR TD PT24
14.0000 mg | MEDICATED_PATCH | Freq: Every day | TRANSDERMAL | Status: DC
  Administered 2013-01-03: 14 mg via TRANSDERMAL
  Filled 2013-01-03: qty 1

## 2013-01-03 MED ORDER — LISINOPRIL 20 MG PO TABS
20.0000 mg | ORAL_TABLET | Freq: Every day | ORAL | Status: DC
  Administered 2013-01-03: 20 mg via ORAL
  Filled 2013-01-03: qty 1

## 2013-01-03 MED ORDER — AMPHETAMINE-DEXTROAMPHET ER 20 MG PO CP24
20.0000 mg | ORAL_CAPSULE | Freq: Every day | ORAL | Status: DC
  Administered 2013-01-03: 20 mg via ORAL
  Filled 2013-01-03: qty 1

## 2013-01-03 MED ORDER — SODIUM CHLORIDE 0.9 % IV SOLN: INTRAVENOUS | Status: DC

## 2013-01-03 MED ORDER — INSULIN GLARGINE 100 UNIT/ML ~~LOC~~ SOLN
60.0000 [IU] | Freq: Two times a day (BID) | SUBCUTANEOUS | Status: DC
  Administered 2013-01-03: 60 [IU] via SUBCUTANEOUS
  Filled 2013-01-03 (×2): qty 0.6

## 2013-01-03 MED ORDER — OXYCODONE HCL 5 MG PO TABS
30.0000 mg | ORAL_TABLET | ORAL | Status: DC | PRN
  Administered 2013-01-03: 30 mg via ORAL
  Filled 2013-01-03: qty 6

## 2013-01-03 MED ORDER — INSULIN ASPART 100 UNIT/ML ~~LOC~~ SOLN
0.0000 [IU] | Freq: Three times a day (TID) | SUBCUTANEOUS | Status: DC
  Administered 2013-01-03: 3 [IU] via SUBCUTANEOUS
  Administered 2013-01-03: 5 [IU] via SUBCUTANEOUS

## 2013-01-03 MED ORDER — PREGABALIN 75 MG PO CAPS: 150.0000 mg | ORAL_CAPSULE | Freq: Two times a day (BID) | ORAL | Status: DC

## 2013-01-03 MED ORDER — ONDANSETRON HCL 4 MG PO TABS: 4.0000 mg | ORAL_TABLET | Freq: Four times a day (QID) | ORAL | Status: DC | PRN

## 2013-01-03 MED ORDER — INSULIN ASPART 100 UNIT/ML ~~LOC~~ SOLN: 5.0000 [IU] | Freq: Three times a day (TID) | SUBCUTANEOUS | Status: DC

## 2013-01-03 MED ORDER — PREGABALIN 150 MG PO CAPS: 150.0000 mg | ORAL_CAPSULE | Freq: Two times a day (BID) | ORAL | Status: DC

## 2013-01-03 MED ORDER — VANCOMYCIN HCL 10 G IV SOLR
1500.0000 mg | Freq: Two times a day (BID) | INTRAVENOUS | Status: DC
  Filled 2013-01-03: qty 1500

## 2013-01-03 MED ORDER — INSULIN ASPART 100 UNIT/ML ~~LOC~~ SOLN
10.0000 [IU] | Freq: Once | SUBCUTANEOUS | Status: AC
  Administered 2013-01-03: 10 [IU] via SUBCUTANEOUS
  Filled 2013-01-03: qty 10

## 2013-01-03 MED ORDER — ENOXAPARIN SODIUM 40 MG/0.4ML ~~LOC~~ SOLN: 40.0000 mg | SUBCUTANEOUS | Status: DC

## 2013-01-03 MED ORDER — ACETAMINOPHEN 325 MG PO TABS: 650.0000 mg | ORAL_TABLET | Freq: Four times a day (QID) | ORAL | Status: DC | PRN

## 2013-01-03 MED ORDER — HYDROMORPHONE HCL PF 1 MG/ML IJ SOLN: 1.0000 mg | INTRAMUSCULAR | Status: DC | PRN

## 2013-01-03 MED ORDER — PREGABALIN 75 MG PO CAPS
150.0000 mg | ORAL_CAPSULE | Freq: Two times a day (BID) | ORAL | Status: DC
  Administered 2013-01-03: 150 mg via ORAL
  Filled 2013-01-03: qty 2

## 2013-01-03 MED ORDER — CLINDAMYCIN HCL 300 MG PO CAPS: 300.0000 mg | ORAL_CAPSULE | Freq: Three times a day (TID) | ORAL | Status: DC

## 2013-01-03 NOTE — Progress Notes (Signed)
ANTIBIOTIC CONSULT NOTE - INITIAL  Pharmacy Consult for Vancomycin and Zosyn Indication: leg cellulitis/lesions with discharge  Allergies  Allergen Reactions  . Acetaminophen Rash    Upset stomach   Patient Measurements:   Adjusted Body Weight: 91kg  Vital Signs: Temp: 98.9 F (37.2 C) (04/21 0338) Temp src: Oral (04/21 0338) BP: 124/64 mmHg (04/21 0400) Pulse Rate: 101 (04/21 0400) Intake/Output from previous day:   Intake/Output from this shift:    Labs:  Recent Labs  01/03/13 0110  WBC 9.0  HGB 15.0  PLT 214  CREATININE 0.58   CrCl is unknown because there is no height on file for the current visit. No results found for this basename: VANCOTROUGH, VANCOPEAK, VANCORANDOM, GENTTROUGH, GENTPEAK, GENTRANDOM, TOBRATROUGH, TOBRAPEAK, TOBRARND, AMIKACINPEAK, AMIKACINTROU, AMIKACIN,  in the last 72 hours   Microbiology: No results found for this or any previous visit (from the past 720 hour(s)).  Medical History: Past Medical History  Diagnosis Date  . Diabetes mellitus   . Hypertension     Medications:  Anti-infectives   Start     Dose/Rate Route Frequency Ordered Stop   01/03/13 0130  clindamycin (CLEOCIN) IVPB 600 mg     600 mg 100 mL/hr over 30 Minutes Intravenous  Once 01/03/13 0119 01/03/13 0257     Assessment:  47yo obese F admitted with lower extremity pain/swelling and small lesions with discharge on back of thigh. Starting abx and ruling out DVT.  Given Clindamycin x 1 in the ER. Pharmacy asked to start Vanc and Zosyn.  SCr is wnl, CrCl ~100.   Goal of Therapy:  Vanc trough 15-20mg /l  Plan:   Vancomycin 2g x 1 then 1.5g IV q12h.  Zosyn 3.375g IV Q8H infused over 4hrs.  Measure Vanc trough at steady state.  Follow up renal fxn and culture results.  Romeo Rabon, PharmD, pager [phone]. 01/03/2013,8:49 AM.

## 2013-01-03 NOTE — ED Provider Notes (Signed)
History     CSN: [id number]  Arrival date & time 01/02/13  2304   First MD Initiated Contact with Patient 01/03/13 0020      Chief Complaint  Patient presents with  . Abscess    (Consider location/radiation/quality/duration/timing/severity/associated sxs/prior treatment) HPI History provided by pt and her daughter.  Pt w/ h/o morbid obesity and poorly controlled, insulin-dependent diabetes, presents w/ intermittent bilateral LE pain x 1 month.  Pain most prominent at left posterior thigh and aggravated by bearing weight.  Takes oxymorphone and oxycodone for chronic back pain, and these medications have given her mild relief of leg pain.   Her daughter who helps to care for her has noticed edema of extremities as well.  Pt denies fever, CP, SOB, extremity paresthesias and rash.  No h/o DVT.   Past Medical History  Diagnosis Date  . Diabetes mellitus   . Hypertension     Past Surgical History  Procedure Laterality Date  . Spinal chord repair  2011  . Replacement total knee bilateral    . Cholecystectomy    . Carpal tunnel repair      Family History  Problem Relation Age of Onset  . Kidney disease Mother   . Congestive Heart Failure Mother   . Hypertension Father     History  Substance Use Topics  . Smoking status: Current Every Day Smoker -- 0.50 packs/day    Types: Cigarettes  . Smokeless tobacco: Not on file  . Alcohol Use: No    OB History   Grav Para Term Preterm Abortions TAB SAB Ect Mult Living                  Review of Systems  All other systems reviewed and are negative.    Allergies  Acetaminophen  Home Medications   Current Outpatient Rx  Name  Route  Sig  Dispense  Refill  . ALPRAZolam (XANAX) 1 MG tablet   Oral   Take 1 mg by mouth 3 (three) times daily. Scheduled doses         . amphetamine-dextroamphetamine (ADDERALL XR) 20 MG 24 hr capsule   Oral   Take 20 mg by mouth every morning.         . carisoprodol (SOMA) 350 MG tablet   Oral   Take 350 mg by mouth 3 (three) times daily as needed. Spasms         . insulin aspart (NOVOLOG) 100 UNIT/ML injection   Subcutaneous   Inject 2-6 Units into the skin 3 (three) times daily before meals. Per sliding scale         . insulin glargine (LANTUS) 100 UNIT/ML injection   Subcutaneous   Inject 60 Units into the skin 2 (two) times daily.         Marland Kitchen lisinopril-hydrochlorothiazide (PRINZIDE,ZESTORETIC) 20-25 MG per tablet   Oral   Take 1 tablet by mouth daily as needed (for elevated blood pressure).          . medroxyPROGESTERone (DEPO-SUBQ PROVERA) 104 MG/0.65ML injection   Subcutaneous   Inject 104 mg into the skin every 3 (three) months.         Marland Kitchen oxycodone (ROXICODONE) 30 MG immediate release tablet   Oral   Take 30 mg by mouth every 4 (four) hours as needed. pain         . pregabalin (LYRICA) 150 MG capsule   Oral   Take 150 mg by mouth 2 (two) times daily.  BP 141/108  Pulse 115  Temp(Src) 98.7 F (37.1 C) (Oral)  Resp 20  SpO2 94%  LMP 11/09/2012  Physical Exam  Nursing note and vitals reviewed. Constitutional: She is oriented to person, place, and time. She appears well-developed and well-nourished. No distress.  Morbidly obese  HENT:  Head: Normocephalic and atraumatic.  Eyes:  Normal appearance  Neck: Normal range of motion.  Cardiovascular: Normal rate and regular rhythm.   Pulmonary/Chest: Effort normal and breath sounds normal. No respiratory distress.  Musculoskeletal: Normal range of motion.  Exam limited d/t body habitus but her daughter reports edema posterior thighs that is most evident to her just superior to popliteal space bilaterally.  Calves are hyperpigmented and brawny and scattered, poorly demarcated areas of erythema and warmth to touch on both anterior and posterior aspects of legs, particularly calves.  Diffuse tenderness.  2+ DP pulses and distal sensation intact.   Neurological: She is alert and  oriented to person, place, and time.  Skin: Skin is warm and dry. No rash noted.  Psychiatric: She has a normal mood and affect. Her behavior is normal.    ED Course  Procedures (including critical care time)  Labs Reviewed  BASIC METABOLIC PANEL - Abnormal; Notable for the following:    Sodium 129 (*)    Chloride 93 (*)    Glucose, Bld 399 (*)    All other components within normal limits  GLUCOSE, CAPILLARY - Abnormal; Notable for the following:    Glucose-Capillary 285 (*)    All other components within normal limits  CBC WITH DIFFERENTIAL   No results found.   1. Cellulitis       MDM  47yo morbidly obese, insulin-dependent diabetic, presents w/ LE pain, edema and erythema.  Exam most consistent w/ patchy cellulitis.  Labs unremarkable w/ exception of hyperglycemia.  Pt has received first dose of IV clindamycin as well as 1mg  dilaudid for pain and 10 units subq novolog.  [facility] consulted for admission for further treatment as well as venous dopplers to r/o DVT.  Pain currently improved and patient sleeping.  3:34 AM        Remer Macho, PA-C 01/03/13 [phone]

## 2013-01-03 NOTE — Progress Notes (Signed)
Patient d/c instructions rendered, verbalized understanding.- Sandie Ano RN

## 2013-01-03 NOTE — ED Provider Notes (Signed)
Medical screening examination/treatment/procedure(s) were conducted as a shared visit with non-physician practitioner(s) and myself.  I personally evaluated the patient during the encounter Shared with [facility] and Hilda Blades on 01/03/13.  Pt with h/o morbid obesity, sleep apnea, DM present with sores to back of B thighs, increase in redness, pain, swelling from baseline.  No CP, SOB.  Pt is mild tachycardic, subjective chills at home, glucose is acutely elevated here tonight, although no evidence of DKA.  Will start IV abx for presumed cellulitis, will need doppler studies in the AM as well for consideration of DVT, although given symmetry, doubtful.  Wound care.    Saddie Benders. Tashanna Dolin, MD 01/03/13 [id number]

## 2013-01-03 NOTE — Progress Notes (Signed)
CARE MANAGEMENT NOTE 01/03/2013  Patient:  [name]   Account Number:  [id number]  Date Initiated:  01/03/2013  Documentation initiated by:  Eitan Doubleday  Subjective/Objective Assessment:   pt with cellulitis of the leg and buttock area, failed outpt therapy     Action/Plan:   lives at home   Anticipated DC Date:  01/06/2013   Anticipated DC Plan:  HOME/SELF CARE  In-house referral  NA      DC Planning Services  NA      [facility] Choice  NA   Choice offered to / List presented to:  NA   DME arranged  NA      DME agency  NA     [place] arranged  NA      [place] agency  NA   Status of service:  In process, will continue to follow Medicare Important Message given?  NA - LOS <3 / Initial given by admissions (If response is "NO", the following Medicare IM given date fields will be blank) Date Medicare IM given:   Date Additional Medicare IM given:    Discharge Disposition:    Per UR Regulation:  Reviewed for med. necessity/level of care/duration of stay  If discussed at [place] of Stay Meetings, dates discussed:    Comments:  [id number] Rosana Hoes, RN, BSN, CCM:  CHART REVIEWED AND UPDATED.  Next chart review due on [id number]. NO DISCHARGE NEEDS PRESENT AT THIS TIME. CASE MANAGEMENT [phone]

## 2013-01-03 NOTE — Progress Notes (Signed)
Patient d/c home via ambulance, stable . Sandie Ano RN

## 2013-01-03 NOTE — Progress Notes (Signed)
CSW received notification from RN that pt needing ambulance transportation home at discharge.  CSW met with pt and pt significant other at bedside and confirmed pt address.  CSW scheduled non-emergency ambulance ([facility]) for pt at 3:30 pm.   RN aware.   No further CSW needs identified at this time.  Drake Leach, MSW, [employer] [phone]

## 2013-01-03 NOTE — Progress Notes (Signed)
Inpatient Diabetes Program Recommendations  AACE/ADA: New Consensus Statement on Inpatient Glycemic Control (2013)  Target Ranges:  Prepandial:   less than 140 mg/dL      Peak postprandial:   less than 180 mg/dL (1-2 hours)      Critically ill patients:  140 - 180 mg/dL   Reason for Visit: Hyperglycemia  Pt continued to fall asleep while trying to inquire about diabetes management at home.  Previous medical records indicate pt's meds include Novolog s/s and Lantus 60 units bid.  Pt did nod yes when asked about checking blood sugars at home. Results for JINNY, GOLAN (MRN [id number]) as of 01/03/2013 12:19  Ref. Range 01/03/2013 01:10  Sodium Latest Range: 135-145 mEq/L 129 (L)  Potassium Latest Range: 3.5-5.1 mEq/L 4.4  Chloride Latest Range: 96-112 mEq/L 93 (L)  CO2 Latest Range: 19-32 mEq/L 31  BUN Latest Range: 6-23 mg/dL 9  Creatinine Latest Range: 0.50-1.10 mg/dL 0.58  Calcium Latest Range: 8.4-10.5 mg/dL 9.3  GFR calc non Af Amer Latest Range: >90 mL/min >90  GFR calc Af Amer Latest Range: >90 mL/min >90  Glucose Latest Range: 70-99 mg/dL 399 (H)  Results for KALENA, HOLZER (MRN [id number]) as of 01/03/2013 12:19  Ref. Range 01/03/2013 03:45 01/03/2013 08:43 01/03/2013 12:07  Glucose-Capillary Latest Range: 70-99 mg/dL 285 (H) 224 (H) 279 (H)   Uncontrolled DM.  Inpatient Diabetes Program Recommendations Correction (SSI): Increase to Novolog resistant tidwc and hs Insulin - Meal Coverage: Add meal coverage insulin - Novolog 6 units tidwc if pt eats >50% meal HgbA1C: Need HgbA1C to assess glycemic control prior to hospitalization - Last one - 03/05/2011 was 8.6%  Note: Will continue to follow for needs.   Thank you. Lorenda Peck, RD, LDN, CDE Inpatient Diabetes Coordinator [phone]

## 2013-01-03 NOTE — ED Notes (Signed)
PA at bedside.

## 2013-01-03 NOTE — H&P (Signed)
[facility] History and Physical  [name] [id number] DOB: 01-Oct-1965 DOA: 01/02/2013  Referring physician: ER Physician. PCP: Grant Fontana, MD  Specialists: None.  Chief Complaint: Pain lower extremities.  HPI: [name] is a 47 y.o. female with history of diabetes mellitus type 2, hypertension, sleep apnea noncompliant with CPAP, morbid obesity presented to the ER because of increasing pain in both lower extremity. Patient's pain is mostly the thigh area and states that she has been also having some mild discharge from small lesions on the posterior aspect of the thigh. Patient has been having these symptoms over the last one week with subjective feeling of fever and chills and increased swelling. Denies any trauma or fall. Denies any chest pain or shortness of breath. At this time patient has been admitted for IV antibiotics and also get Dopplers to rule out DVT.  Review of Systems: As presented in the history of presenting illness, rest negative.  Past Medical History  Diagnosis Date  . Diabetes mellitus   . Hypertension    Past Surgical History  Procedure Laterality Date  . Spinal chord repair  2011  . Replacement total knee bilateral    . Cholecystectomy    . Carpal tunnel repair     Social History:  reports that she has been smoking Cigarettes.  She has been smoking about 0.50 packs per day. She does not have any smokeless tobacco history on file. She reports that she does not drink alcohol or use illicit drugs. Lives at home. where does patient live-- Can do ADLs. Can patient participate in ADLs?  Allergies  Allergen Reactions  . Acetaminophen Rash    Upset stomach    Family History  Problem Relation Age of Onset  . Kidney disease Mother   . Congestive Heart Failure Mother   . Hypertension Father       Prior to Admission medications   Medication Sig Start Date End Date Taking? Authorizing Provider  ALPRAZolam Duanne Moron) 1 MG tablet Take 1  mg by mouth 3 (three) times daily. Scheduled doses   Yes Historical Provider, MD  amphetamine-dextroamphetamine (ADDERALL XR) 20 MG 24 hr capsule Take 20 mg by mouth every morning.   Yes Historical Provider, MD  carisoprodol (SOMA) 350 MG tablet Take 350 mg by mouth 3 (three) times daily as needed. Spasms   Yes Historical Provider, MD  insulin aspart (NOVOLOG) 100 UNIT/ML injection Inject 2-6 Units into the skin 3 (three) times daily before meals. Per sliding scale   Yes Historical Provider, MD  insulin glargine (LANTUS) 100 UNIT/ML injection Inject 60 Units into the skin 2 (two) times daily.   Yes Historical Provider, MD  lisinopril-hydrochlorothiazide (PRINZIDE,ZESTORETIC) 20-25 MG per tablet Take 1 tablet by mouth daily as needed (for elevated blood pressure).    Yes Historical Provider, MD  medroxyPROGESTERone (DEPO-SUBQ PROVERA) 104 MG/0.65ML injection Inject 104 mg into the skin every 3 (three) months.   Yes Historical Provider, MD  oxycodone (ROXICODONE) 30 MG immediate release tablet Take 30 mg by mouth every 4 (four) hours as needed. pain   Yes Historical Provider, MD  pregabalin (LYRICA) 150 MG capsule Take 150 mg by mouth 2 (two) times daily.   Yes Historical Provider, MD   Physical Exam: Filed Vitals:   01/03/13 [id number] 01/03/13 0338 01/03/13 0345 01/03/13 0400  BP:  109/61 108/52 124/64  Pulse: 106 104 105 101  Temp:  98.9 F (37.2 C)    TempSrc:  Oral    Resp:  20    SpO2: 96% 100% 100% 100%     General:  Well-developed and nourished.  Eyes: Anicteric no pallor.  ENT: No discharge from the ears eyes nose or mouth.  Neck: No mass felt.  Cardiovascular: S1-S2 heard.  Respiratory: No rhonchi or crepitations.  Abdomen: Soft nontender bowel sounds present.  Skin: Mild discoloration of skin in the both thigh areas with 1 cm skin lesion on the posterior aspect of both thighs. No active discharge seen.  Musculoskeletal: Chronic lymphedema.  Psychiatric: Appears  normal.  Neurologic: Alert awake oriented to time place and person. Moves all extremities.  Labs on Admission:  Basic Metabolic Panel:  Recent Labs Lab 01/03/13 0110  NA 129*  K 4.4  CL 93*  CO2 31  GLUCOSE 399*  BUN 9  CREATININE 0.58  CALCIUM 9.3   Liver Function Tests: No results found for this basename: AST, ALT, ALKPHOS, BILITOT, PROT, ALBUMIN,  in the last 168 hours No results found for this basename: LIPASE, AMYLASE,  in the last 168 hours No results found for this basename: AMMONIA,  in the last 168 hours CBC:  Recent Labs Lab 01/03/13 0110  WBC 9.0  NEUTROABS 4.8  HGB 15.0  HCT 43.3  MCV 87.5  PLT 214   Cardiac Enzymes: No results found for this basename: CKTOTAL, CKMB, CKMBINDEX, TROPONINI,  in the last 168 hours  BNP (last 3 results) No results found for this basename: PROBNP,  in the last 8760 hours CBG:  Recent Labs Lab 01/03/13 0345  GLUCAP 285*    Radiological Exams on Admission: No results found.   Assessment/Plan Principal Problem:   Lower extremity cellulitis Active Problems:   TOBACCO DEPENDENCE   HYPERTENSION, BENIGN SYSTEMIC   APNEA, SLEEP   Diabetes mellitus   1. Bilateral lower extremity pain and swelling most likely from cellulitis - at this time patient has been placed on empiric antibiotics. Check Doppler to rule out DVT. Small lesions on the posterior aspect of thigh for which I have consulted wound team. 2. Uncontrolled diabetes mellitus type 2 - check hemoglobin A1c. Closely follow CBG. Get nutrition consult. 3. Mild hyponatremia - probably from dehydration. Patient did receive IV fluids in the ER. I will hold off further IV fluids for now. Closely follow metabolic panel. We'll hold off HCTZ due to hyponatremia. 4. Hypertension - continue lisinopril but we'll hold off HCTZ due to hyponatremia. 5. Sleep apnea noncompliant with CPAP. 6. Tobacco abuse - strongly advised to quit smoking.    Code Status: Full code.   Family Communication: Patient's husband at the bedside.  Disposition Plan: Admit to inpatient.    Makar Slatter N. [facility] Pager [phone].  If 7PM-7AM, please contact night-coverage [email or web address] Password [email or web address] 01/03/2013, 4:30 AM

## 2013-01-03 NOTE — Progress Notes (Signed)
Bilateral lower extremity venous duplex:  No obvious evidence of DVT, superficial thrombosis, or Baker's Cyst.  Technically difficult study due to the patient's body habitus.

## 2013-01-03 NOTE — Discharge Summary (Signed)
Physician Discharge Summary  [name] [id number] DOB: 03-Aug-1966 DOA: 01/02/2013  PCP: Grant Fontana, MD  Admit date: 01/02/2013 Discharge date: 01/03/2013  Recommendations for Outpatient Follow-up:  1. Patient needs to follow up with PCP in about 1 week after the discharge to follow up on the resolution of cellulitis 2. Please follow up sodium level on your next appointment with PCP 3. Please note that we have given you a new prescription to take for better diabetes control; novolog flex pen 5 unit 3 x day with meals. Please note that this is the recommendation of a diabetic coordinator.  4. Please follow up with you primary care physician to make sure your cellulitis is improving. 5. We have also given you a prescription for cellulitis which is clindamycin to take for 10 days.  Discharge Diagnoses:  Principal Problem:   Lower extremity cellulitis Active Problems:   TOBACCO DEPENDENCE   HYPERTENSION, BENIGN SYSTEMIC   APNEA, SLEEP   Diabetes mellitus  Discharge Condition: medically stable for discharge home today  Diet recommendation: heart healthy and diabetic diet  History of present illness:  47 y.o. female with past medical history of diabetes mellitus type 2, hypertension, sleep apnea noncompliant with CPAP, morbid obesity presented to the [facility] ED 01/02/2013  With worsening pain in bilateral lower extremities for few days prior to this admission. Evaluation included lower extremity doppler which was negative for DVT. Patient was also evaluated by wound care for labial wart as well as chronic skin changes. [facility] nurse has recommended applying antifungal powder to the labial area as well as GYN follow up.   Assessment and Plan:  Principal Problem: Bilateral lower extremity pain - lower extremity doppler is negative for DVT - patient does not really have lower extremity swelling however her extremities are very obese and with effects of chronic skin changes. I did  however recommend we continue antibiotics for her, clindamycin 300 mg TID for next 10 days and her pain may improve. She was also instructed to follow up with PCP to make sure pain is resolving. - Please note that the patient did not have fever or elevated WBC count on admission which really points to no infection however considering history of diabetes and hard to say wether there is an erythema as she is an african [place] female I did decide to put her on PO clinda  Active problems: Uncontrolled diabetes mellitus type 2 with complications of peripheral neuropathy - A1c on this admission 11.3 indicating poor glycemic control - per diabetic coordinator we will add novolog flex pen 5 units TID in addition to her home lantus regimen Mild hyponatremia  - probably from dehydration - instructed to follow up with PCP sodium level  Leisa Lenz [facility] [id number]  Procedures:  none  Consultations:  Wound care which recommended cleaning skin in labial folds due to small wart noticed and GYN follow up which patient confirmed she will make her appointment. Also use of antifungal powder at the same area and skin folds.  Discharge Exam: Filed Vitals:   01/03/13 1310  BP: 117/55  Pulse: 102  Temp: 99.4 F (37.4 C)  Resp: 16   Filed Vitals:   01/03/13 0800 01/03/13 0844 01/03/13 1025 01/03/13 1310  BP: 121/72  121/70 117/55  Pulse: 100   102  Temp: 98.3 F (36.8 C)   99.4 F (37.4 C)  TempSrc: Oral   Oral  Resp: 18   16  Height:  5\' 4"  (1.626 m)  Weight:  176 kg (388 lb 0.2 oz)    SpO2: 93%   94%    General: Pt is alert, follows commands appropriately, not in acute distress Cardiovascular: Regular rate and rhythm, S1/S2 +, no murmurs, no rubs, no gallops Respiratory: Clear to auscultation bilaterally, no wheezing, no crackles, no rhonchi Abdominal: Soft, non tender, non distended, bowel sounds +, no guarding Extremities: no edema, no cyanosis, pulses palpable bilaterally DP and  PT Neuro: Grossly nonfocal  Discharge Instructions  Discharge Orders   Future Orders Complete By Expires     Diet - low sodium heart healthy  As directed     Discharge instructions  As directed     Comments:      Please note that we have given you a new prescription to take for better diabetes control; novolog flex pen 5 unit 3 x day with meals. Please note that this is the recommendation of a diabetic coordinator.  Please follow up with you primary care physician to make sure your cellulitis is improving. We have also given you a prescription for cellulitis which is clindamycin to take for 10 days.    Increase activity slowly  As directed         Medication List    TAKE these medications       ALPRAZolam 1 MG tablet  Commonly known as:  XANAX  Take 1 tablet (1 mg total) by mouth 3 (three) times daily. Scheduled doses     amphetamine-dextroamphetamine 20 MG 24 hr capsule  Commonly known as:  ADDERALL XR  Take 20 mg by mouth every morning.     carisoprodol 350 MG tablet  Commonly known as:  SOMA  Take 350 mg by mouth 3 (three) times daily as needed. Spasms     clindamycin 300 MG capsule  Commonly known as:  CLEOCIN  Take 1 capsule (300 mg total) by mouth 3 (three) times daily.     HYDROmorphone 4 MG tablet  Commonly known as:  DILAUDID  Take 1 tablet (4 mg total) by mouth every 4 (four) hours as needed for pain.     insulin aspart 100 UNIT/ML injection  Commonly known as:  novoLOG  Inject 2-6 Units into the skin 3 (three) times daily before meals. Per sliding scale     insulin aspart 100 UNIT/ML injection  Commonly known as:  NOVOLOG FLEXPEN  Inject 5 Units into the skin 3 (three) times daily before meals.     insulin glargine 100 UNIT/ML injection  Commonly known as:  LANTUS  Inject 60 Units into the skin 2 (two) times daily.     lisinopril-hydrochlorothiazide 20-25 MG per tablet  Commonly known as:  PRINZIDE,ZESTORETIC  Take 1 tablet by mouth daily as needed  (for elevated blood pressure).     medroxyPROGESTERone 104 MG/0.65ML injection  Commonly known as:  DEPO-SUBQ PROVERA  Inject 104 mg into the skin every 3 (three) months.     oxycodone 30 MG immediate release tablet  Commonly known as:  ROXICODONE  Take 30 mg by mouth every 4 (four) hours as needed. pain     oxycodone 30 MG immediate release tablet  Commonly known as:  ROXICODONE  Take 1 tablet (30 mg total) by mouth every 4 (four) hours as needed.     pregabalin 150 MG capsule  Commonly known as:  LYRICA  Take 1 capsule (150 mg total) by mouth 2 (two) times daily.           Follow-up  Information   Schedule an appointment as soon as possible for a visit with Grant Fontana, MD.   Contact information:   [address] [place] [address] [phone]        The results of significant diagnostics from this hospitalization (including imaging, microbiology, ancillary and laboratory) are listed below for reference.    Significant Diagnostic Studies: No results found.  Microbiology: No results found for this or any previous visit (from the past 240 hour(s)).   Labs: Basic Metabolic Panel:  Recent Labs Lab 01/03/13 0110  NA 129*  K 4.4  CL 93*  CO2 31  GLUCOSE 399*  BUN 9  CREATININE 0.58  CALCIUM 9.3   Liver Function Tests: No results found for this basename: AST, ALT, ALKPHOS, BILITOT, PROT, ALBUMIN,  in the last 168 hours No results found for this basename: LIPASE, AMYLASE,  in the last 168 hours No results found for this basename: AMMONIA,  in the last 168 hours CBC:  Recent Labs Lab 01/03/13 0110  WBC 9.0  NEUTROABS 4.8  HGB 15.0  HCT 43.3  MCV 87.5  PLT 214   Cardiac Enzymes: No results found for this basename: CKTOTAL, CKMB, CKMBINDEX, TROPONINI,  in the last 168 hours BNP: BNP (last 3 results) No results found for this basename: PROBNP,  in the last 8760 hours CBG:  Recent Labs Lab 01/03/13 0345  01/03/13 0843 01/03/13 1207  GLUCAP 285* 224* 279*    Time coordinating discharge: Over 30 minutes  Signed:  Leisa Lenz, MD  [facility]  01/03/2013, 2:19 PM  Pager #: [phone]

## 2013-01-03 NOTE — Progress Notes (Signed)
Prescriptions given to patient.- Sandie Ano RN

## 2013-01-03 NOTE — Consult Note (Signed)
WOC consult Note Reason for Consult: evaluation of thigh wound.  Discussed current home situation with patient and her current level of mobility. She is incontinent secondary to spinal surgery. She is morbidly obese. She reports she has a lift chair now but prior to that she had been sliding out of chair or bed on her backside and back of her thighs. She has some evidence of friction/sheer injury on the back of her buttocks and thighs, she does have two areas that have become "horney" with hyperkeratotic skin they are minimally open but covered with this thickened skin.  She has MASD in the apex of her gluteal folds. She has a wart lesion on the labia majora proximally that she reports is pruritic at times.  I have given her detailed instructions on the care of the intertriginous skin folds to prevent skin issues. Utilized antifungal powder in skin folds, but the skin must be clean and dried prior to application.  I have recommended follow up with a GYN on the wart for definitive diagnosis (skin tag vs. Genital wart) she reports she has not been sexually active in a long time but I explained these types of warts can be dormant and then reoccur and she does after this explanation recall similar presentation years ago.     Wound type: hyperkeratotic skin lesion posterior left thigh, sheer/friction related Dressing procedure/placement/frequency:antifungal powder inframammary, under the pannus, in the skin folds of the LE, and in the apex of the gluteal fold. Silicone foam dressing to the posterior left thigh to protect from sheer.    Excellent skin care at discharge to home, dry skin and skin folds, apply antifungal powder to affected areas.   Re consult if needed, will not follow at this time. Thanks  Sunita Demond Kellogg, [place] [phone])

## 2013-01-03 NOTE — Progress Notes (Signed)
Patient came to unit at this time, alert and oriented,placed comfortably in bed.- Sandie Ano RN

## 2013-05-16 ENCOUNTER — Emergency Department (HOSPITAL_COMMUNITY)
Admission: EM | Admit: 2013-05-16 | Discharge: 2013-05-16 | Disposition: A | Discharge status: Home / Self Care | Payer: Medicare Other | Attending: Emergency Medicine | Admitting: Emergency Medicine

## 2013-05-16 ENCOUNTER — Encounter (HOSPITAL_COMMUNITY): Payer: Self-pay | Admitting: *Deleted

## 2013-05-16 ENCOUNTER — Emergency Department (HOSPITAL_COMMUNITY): Payer: Medicare Other

## 2013-05-16 DIAGNOSIS — F17200 Nicotine dependence, unspecified, uncomplicated: Secondary | ICD-10-CM | POA: Insufficient documentation

## 2013-05-16 DIAGNOSIS — Z79899 Other long term (current) drug therapy: Secondary | ICD-10-CM | POA: Insufficient documentation

## 2013-05-16 DIAGNOSIS — R0602 Shortness of breath: Secondary | ICD-10-CM | POA: Insufficient documentation

## 2013-05-16 DIAGNOSIS — Z794 Long term (current) use of insulin: Secondary | ICD-10-CM | POA: Insufficient documentation

## 2013-05-16 DIAGNOSIS — R609 Edema, unspecified: Secondary | ICD-10-CM

## 2013-05-16 DIAGNOSIS — R600 Localized edema: Secondary | ICD-10-CM

## 2013-05-16 DIAGNOSIS — Z3202 Encounter for pregnancy test, result negative: Secondary | ICD-10-CM | POA: Insufficient documentation

## 2013-05-16 DIAGNOSIS — E119 Type 2 diabetes mellitus without complications: Secondary | ICD-10-CM | POA: Insufficient documentation

## 2013-05-16 DIAGNOSIS — I10 Essential (primary) hypertension: Secondary | ICD-10-CM | POA: Insufficient documentation

## 2013-05-16 DIAGNOSIS — Z792 Long term (current) use of antibiotics: Secondary | ICD-10-CM | POA: Insufficient documentation

## 2013-05-16 DIAGNOSIS — L293 Anogenital pruritus, unspecified: Secondary | ICD-10-CM | POA: Insufficient documentation

## 2013-05-16 LAB — CBC WITH DIFFERENTIAL/PLATELET
HCT: 39.1 % (ref 36.0–46.0)
Hemoglobin: 13.3 g/dL (ref 12.0–15.0)
Lymphocytes Relative: 32 % (ref 12–46)
Lymphs Abs: 2.7 10*3/uL (ref 0.7–4.0)
MCHC: 34 g/dL (ref 30.0–36.0)
Monocytes Absolute: 0.7 10*3/uL (ref 0.1–1.0)
Monocytes Relative: 8 % (ref 3–12)
Neutro Abs: 4.8 10*3/uL (ref 1.7–7.7)
Neutrophils Relative %: 58 % (ref 43–77)
RBC: 4.34 MIL/uL (ref 3.87–5.11)

## 2013-05-16 LAB — POCT I-STAT TROPONIN I: Troponin i, poc: 0 ng/mL (ref 0.00–0.08)

## 2013-05-16 LAB — BASIC METABOLIC PANEL
BUN: 6 mg/dL (ref 6–23)
CO2: 26 mEq/L (ref 19–32)
GFR calc Af Amer: >90 mL/min (ref >90)
GFR calc non Af Amer: >90 mL/min (ref >90)

## 2013-05-16 LAB — URINE MICROSCOPIC-ADD ON

## 2013-05-16 LAB — URINALYSIS, ROUTINE W REFLEX MICROSCOPIC
Glucose, UA: >1000 mg/dL — AB
Ketones, ur: NEGATIVE mg/dL
Leukocytes, UA: NEGATIVE
Protein, ur: NEGATIVE mg/dL

## 2013-05-16 LAB — WET PREP, GENITAL
Clue Cells Wet Prep HPF POC: NONE SEEN
Trich, Wet Prep: NONE SEEN

## 2013-05-16 LAB — PRO B NATRIURETIC PEPTIDE: Pro B Natriuretic peptide (BNP): 133.9 pg/mL — ABNORMAL HIGH (ref 0–125)

## 2013-05-16 NOTE — ED Provider Notes (Signed)
CSN: [id number]     Arrival date & time 05/16/13  1540 History   First MD Initiated Contact with Patient 05/16/13 1603     Chief Complaint  Patient presents with  . Leg Swelling  . Vaginal Itching   (Consider location/radiation/quality/duration/timing/severity/associated sxs/prior Treatment) HPI Comments: 47 year old morbidly obese female with a past medical history of diabetes and hypertension presents to the emergency department complaining of bilateral leg swelling worsening over the past 4 days. States she was diagnosed with cellulitis one year ago and believes that this is the same problem. States her legs feel warm to touch from her ankles all the way up her entire legs. Denies fever or chills. Admits to associated shortness of breath on exertion. Denies any chest pain. No history of heart failure. Patient also complaining of vaginal itching over the past few days, and states this tends to happen when her nurse does not wipe her well. Needs to have a nurse wipe her because she cannot see that area on her own. She is diabetic, blood sugars have been between 200-250. Denies increased urinary frequency, urgency or dysuria.  Patient is a 47 y.o. female presenting with vaginal itching. The history is provided by the patient.  Vaginal Itching Pertinent negatives include no chest pain, chills, coughing, fever, headaches or weakness.    Past Medical History  Diagnosis Date  . Diabetes mellitus   . Hypertension    Past Surgical History  Procedure Laterality Date  . Spinal chord repair  2011  . Replacement total knee bilateral    . Carpal tunnel repair    . Joint replacement      bllateral total knees  . Cholecystectomy  1999  . Toenails removed from bilateral great toes     Family History  Problem Relation Age of Onset  . Kidney disease Mother   . Congestive Heart Failure Mother   . Hypertension Father    History  Substance Use Topics  . Smoking status: Current Every Day Smoker --  0.50 packs/day for 22 years    Types: Cigarettes  . Smokeless tobacco: Never Used  . Alcohol Use: No   OB History   Grav Para Term Preterm Abortions TAB SAB Ect Mult Living                 Review of Systems  Constitutional: Negative for fever and chills.  Respiratory: Positive for shortness of breath. Negative for cough and wheezing.   Cardiovascular: Positive for leg swelling. Negative for chest pain.  Genitourinary: Negative for dysuria, flank pain and pelvic pain.       Positive for vaginal itching.  Neurological: Negative for dizziness, syncope, weakness, light-headedness and headaches.  All other systems reviewed and are negative.    Allergies  Acetaminophen  Home Medications   Current Outpatient Rx  Name  Route  Sig  Dispense  Refill  . ALPRAZolam (XANAX) 1 MG tablet   Oral   Take 1 tablet (1 mg total) by mouth 3 (three) times daily. Scheduled doses   45 tablet   0   . amphetamine-dextroamphetamine (ADDERALL XR) 20 MG 24 hr capsule   Oral   Take 20 mg by mouth every morning.         . carisoprodol (SOMA) 350 MG tablet   Oral   Take 350 mg by mouth 3 (three) times daily as needed. Spasms         . clindamycin (CLEOCIN) 300 MG capsule   Oral   Take  1 capsule (300 mg total) by mouth 3 (three) times daily.   30 capsule   0   . HYDROmorphone (DILAUDID) 4 MG tablet   Oral   Take 1 tablet (4 mg total) by mouth every 4 (four) hours as needed for pain.   30 tablet   0   . insulin aspart (NOVOLOG FLEXPEN) 100 UNIT/ML injection   Subcutaneous   Inject 5 Units into the skin 3 (three) times daily before meals.   1 vial   6   . insulin aspart (NOVOLOG) 100 UNIT/ML injection   Subcutaneous   Inject 2-6 Units into the skin 3 (three) times daily before meals. Per sliding scale         . insulin glargine (LANTUS) 100 UNIT/ML injection   Subcutaneous   Inject 60 Units into the skin 2 (two) times daily.         Marland Kitchen lisinopril-hydrochlorothiazide  (PRINZIDE,ZESTORETIC) 20-25 MG per tablet   Oral   Take 1 tablet by mouth daily as needed (for elevated blood pressure).          . medroxyPROGESTERone (DEPO-SUBQ PROVERA) 104 MG/0.65ML injection   Subcutaneous   Inject 104 mg into the skin every 3 (three) months.         Marland Kitchen oxycodone (ROXICODONE) 30 MG immediate release tablet   Oral   Take 30 mg by mouth every 4 (four) hours as needed. pain         . oxyCODONE (ROXICODONE) 30 MG immediate release tablet   Oral   Take 1 tablet (30 mg total) by mouth every 4 (four) hours as needed.   60 tablet   0   . pregabalin (LYRICA) 150 MG capsule   Oral   Take 1 capsule (150 mg total) by mouth 2 (two) times daily.   60 capsule   0    BP 143/79  Pulse 88  Temp(Src) 98.2 F (36.8 C) (Oral)  Resp 18  SpO2 98% Physical Exam  Nursing note and vitals reviewed. Constitutional: She is oriented to person, place, and time. She appears well-developed. No distress.  Morbidly obese  HENT:  Head: Normocephalic and atraumatic.  Mouth/Throat: Oropharynx is clear and moist.  Eyes: Conjunctivae and EOM are normal. Pupils are equal, round, and reactive to light.  Neck: Normal range of motion. Neck supple.  Cardiovascular: Normal rate, regular rhythm, normal heart sounds and intact distal pulses.   Pulses:      Dorsalis pedis pulses are 2+ on the right side, and 2+ on the left side.  +1 pitting edema LE bilateral. No erythema or warmth. Tenderness to palpation.  Pulmonary/Chest: Effort normal and breath sounds normal. No respiratory distress. She has no wheezes.  Abdominal: Soft. Bowel sounds are normal. She exhibits no distension. There is no tenderness.  Genitourinary: There is no rash or tenderness on the right labia. There is no rash or tenderness on the left labia. No vaginal discharge found.  Foul odor from vagina. Exam limited by patient's body habitus.  Musculoskeletal: Normal range of motion.  Neurological: She is alert and oriented  to person, place, and time.  Skin: Skin is warm and dry. She is not diaphoretic.     Psychiatric: She has a normal mood and affect. Her behavior is normal.    ED Course  Procedures (including critical care time) Labs Review Labs Reviewed  WET PREP, GENITAL - Abnormal; Notable for the following:    WBC, Wet Prep HPF POC FEW (*)  All other components within normal limits  URINALYSIS, ROUTINE W REFLEX MICROSCOPIC - Abnormal; Notable for the following:    Glucose, UA >1000 (*)    All other components within normal limits  BASIC METABOLIC PANEL - Abnormal; Notable for the following:    Sodium 132 (*)    Glucose, Bld 327 (*)    Creatinine, Ser 0.45 (*)    All other components within normal limits  PRO B NATRIURETIC PEPTIDE - Abnormal; Notable for the following:    Pro B Natriuretic peptide (BNP) 133.9 (*)    All other components within normal limits  CBC WITH DIFFERENTIAL  TROPONIN I  URINE MICROSCOPIC-ADD ON  POCT I-STAT TROPONIN I  POCT PREGNANCY, URINE   Imaging Review Dg Chest Port 1 View  05/16/2013   *RADIOLOGY REPORT*  Clinical Data: Edema, shortness of breath  PORTABLE CHEST - 1 VIEW  Comparison: 03/04/2011  Findings: Cardiomegaly with vascular congestion and increased interstitial prominence.  Early edema not excluded.  No effusion or definite focal pneumonia.  No collapse, consolidation, or pneumothorax.  Lower cervical fusion hardware evident.  Trachea is midline.  IMPRESSION: Cardiomegaly with vascular congestion versus early edema.   Original Report Authenticated By: Jerilynn Mages. Annamaria Boots, M.D.    MDM   1. Peripheral edema    Patient with lower extremity edema, no erythema or warmth. She is afebrile, no leukocytosis, low suspicion for cellulitis. Patient also reported some shortness of breath on exertion, chest x-ray obtained and showed cardiomegaly with vascular congestion versus early edema. BNP also obtained which was 133.9, prior results if the 400s. Regarding vaginal itching,  wet prep negative. No discharge noted. Advised her to elevate legs, sit in bath with home nurses help to clean vaginal area more thoroughly. Case discussed with my attending Dr. Eulis Foster who agrees with plan of care.    Illene Labrador, PA-C 05/16/13 1916

## 2013-05-16 NOTE — ED Provider Notes (Signed)
Medical screening examination/treatment/procedure(s) were performed by non-physician practitioner and as supervising physician I was immediately available for consultation/collaboration.  Richarda Blade, MD 05/16/13 [phone]

## 2013-05-16 NOTE — ED Notes (Signed)
Pt states here with cellulitis to lower legs and swelling now to upper legs.  Pt states that legs are warm to touch.  Pt is having vaginal itching and states this happens when her nurse does not wipe her good.   Pt is diabetic and blood sugars have been 200-250.  Pt thinks she may have yeast infection.

## 2013-06-19 ENCOUNTER — Encounter: Payer: Self-pay | Admitting: Family Medicine

## 2014-04-12 ENCOUNTER — Emergency Department: Payer: Self-pay | Admitting: Emergency Medicine

## 2014-05-14 ENCOUNTER — Inpatient Hospital Stay: Payer: Self-pay | Admitting: Internal Medicine

## 2014-05-14 LAB — COMPREHENSIVE METABOLIC PANEL
Albumin: 3 g/dL — ABNORMAL LOW (ref 3.4–5.0)
Alkaline Phosphatase: 119 U/L — ABNORMAL HIGH
Anion Gap: 8 (ref 7–16)
BUN: 9 mg/dL (ref 7–18)
Bilirubin,Total: 1.2 mg/dL — ABNORMAL HIGH (ref 0.2–1.0)
CHLORIDE: 99 mmol/L (ref 98–107)
CREATININE: 0.82 mg/dL (ref 0.60–1.30)
Calcium, Total: 8.6 mg/dL (ref 8.5–10.1)
Co2: 29 mmol/L (ref 21–32)
EGFR (African American): >60
Glucose: 147 mg/dL — ABNORMAL HIGH (ref 65–99)
OSMOLALITY: 273 (ref 275–301)
POTASSIUM: 3.9 mmol/L (ref 3.5–5.1)
SGOT(AST): 31 U/L (ref 15–37)
SGPT (ALT): 21 U/L
Sodium: 136 mmol/L (ref 136–145)
Total Protein: 8.3 g/dL — ABNORMAL HIGH (ref 6.4–8.2)

## 2014-05-14 LAB — PHOSPHORUS: PHOSPHORUS: 2.8 mg/dL (ref 2.5–4.9)

## 2014-05-14 LAB — PROTIME-INR
INR: 1
Prothrombin Time: 13.3 secs (ref 11.5–14.7)

## 2014-05-14 LAB — CBC WITH DIFFERENTIAL/PLATELET
BASOS ABS: 0.1 10*3/uL (ref 0.0–0.1)
Basophil %: 0.6 %
EOS PCT: 0.7 %
Eosinophil #: 0.1 10*3/uL (ref 0.0–0.7)
HCT: 37.9 % (ref 35.0–47.0)
HGB: 12.3 g/dL (ref 12.0–16.0)
LYMPHS ABS: 2.9 10*3/uL (ref 1.0–3.6)
Lymphocyte %: 24.1 %
MCH: 29.9 pg (ref 26.0–34.0)
MCHC: 32.5 g/dL (ref 32.0–36.0)
MCV: 92 fL (ref 80–100)
Monocyte #: 1.5 x10 3/mm — ABNORMAL HIGH (ref 0.2–0.9)
Monocyte %: 12.5 %
NEUTROS ABS: 7.5 10*3/uL — AB (ref 1.4–6.5)
Neutrophil %: 62.1 %
PLATELETS: 227 10*3/uL (ref 150–440)
RBC: 4.12 10*6/uL (ref 3.80–5.20)
RDW: 14 % (ref 11.5–14.5)
WBC: 12.1 10*3/uL — AB (ref 3.6–11.0)

## 2014-05-14 LAB — TROPONIN I

## 2014-05-14 LAB — MAGNESIUM: MAGNESIUM: 1.4 mg/dL — AB

## 2014-05-15 LAB — CBC WITH DIFFERENTIAL/PLATELET
Basophil #: 0 10*3/uL (ref 0.0–0.1)
Basophil %: 0.4 %
Eosinophil #: 0.1 10*3/uL (ref 0.0–0.7)
Eosinophil %: 1.6 %
HCT: 33.9 % — ABNORMAL LOW (ref 35.0–47.0)
HGB: 10.9 g/dL — AB (ref 12.0–16.0)
Lymphocyte #: 2.3 10*3/uL (ref 1.0–3.6)
Lymphocyte %: 32.6 %
MCH: 29.9 pg (ref 26.0–34.0)
MCHC: 32.1 g/dL (ref 32.0–36.0)
MCV: 93 fL (ref 80–100)
MONO ABS: 0.7 x10 3/mm (ref 0.2–0.9)
Monocyte %: 10.5 %
NEUTROS ABS: 3.9 10*3/uL (ref 1.4–6.5)
NEUTROS PCT: 54.9 %
PLATELETS: 174 10*3/uL (ref 150–440)
RBC: 3.64 10*6/uL — ABNORMAL LOW (ref 3.80–5.20)
RDW: 13.9 % (ref 11.5–14.5)
WBC: 7.1 10*3/uL (ref 3.6–11.0)

## 2014-05-15 LAB — BASIC METABOLIC PANEL
Anion Gap: 5 — ABNORMAL LOW (ref 7–16)
BUN: 8 mg/dL (ref 7–18)
CALCIUM: 7.6 mg/dL — AB (ref 8.5–10.1)
CHLORIDE: 108 mmol/L — AB (ref 98–107)
CO2: 30 mmol/L (ref 21–32)
CREATININE: 0.74 mg/dL (ref 0.60–1.30)
EGFR (Non-African Amer.): >60
GLUCOSE: 123 mg/dL — AB (ref 65–99)
OSMOLALITY: 285 (ref 275–301)
POTASSIUM: 3.7 mmol/L (ref 3.5–5.1)
Sodium: 143 mmol/L (ref 136–145)

## 2014-05-15 LAB — MAGNESIUM: Magnesium: 2.2 mg/dL

## 2014-05-19 LAB — CULTURE, BLOOD (SINGLE)

## 2014-06-08 LAB — CULTURE, BLOOD (SINGLE)

## 2015-01-06 NOTE — Discharge Summary (Signed)
PATIENT NAME:  [name], [name] MR#:  [id number] DATE OF BIRTH:  [birth year]   PRIMARY CARE PHYSICIAN:  None local.    DATE OF ADMISSION: 05/14/2014.   DATE OF DISCHARGE: 05/15/2014.   DISCHARGE DIAGNOSES:  1.  Bilateral leg cellulitis and sepsis.  2.  Diabetes.  3.  Obstructive sleep apnea.  4.  Chronic pain syndrome.   CONDITION: Stable.   CODE STATUS: Full code.   HOME MEDICATIONS: Please refer to the medication reconciliation list. The patient's new medication Augmentin 875 mg/125 mg p.o. tablets 1 tablet q. 12 hours for 7 days.   DIET: Low salt, low fat, low cholesterol, ADA diet.   ACTIVITY: As tolerated.   FOLLOWUP CARE: Follow with PCP within 1 to 2 weeks. Also, the patient needs smoking cessation.    REASON FOR ADMISSION: Lower extremity pain.   HOSPITAL COURSE: The patient is a 49 year old African American female with a history of morbid obesity, chronic back pain and chronic pain syndrome, diabetes, was sent to the ED due to lower extremity pain for 2 to 3 days. The patient was found to be febrile at 100.2 with  leukocytosis of 20,000. The patient was treated with vancomycin and Zosyn in the ED after being admitted for bilateral leg cellulitis.  For a detailed history and physical examination, please refer to the admission note dictated by Dr. Waldron Labs.  On admission date, the patient's laboratory data showed BUN 9, creatinine 0.32, electrolytes were normal. WBC 12.1, hemoglobin 12.3.    ASSESSMENT:  1.  After admission, the patient has been treated with vancomycin and Zosyn, and a blood culture was sent, which is negative. The patient's bilateral leg cellulitis is much better and the patient has no complaints.  2.  For diabetes, the patient has been treated with sliding scale and Levemir.  Blood sugar has been controlled.  3.  Tobacco abuse. The patient was counseled for smoking cessation.  4.  For chronic pain syndrome, the patient has been treated with oxycodone p.r.n.    The patient has no complaints.  Her white count decreased to normal range. She is clinically stable. She will be discharged to home today. Discussed the patient's discharge plan with the patient and the patient's daughter.   TIME SPENT: About 36 minutes.   ____________________________ Demetrios Loll, MD qc:lr D: 05/15/2014 11:47:00 ET T: 05/15/2014 12:12:43 ET JOB#: [id number]  cc: Demetrios Loll, MD, <Dictator> Demetrios Loll MD ELECTRONICALLY SIGNED 05/15/2014 14:27

## 2015-01-06 NOTE — H&P (Signed)
PATIENT NAME:  [name], ALVILLAR MR#:  [id number] DATE OF BIRTH:  1966/03/13  DATE OF ADMISSION:  05/14/2014  REFERRING PHYSICIAN: Dr.  Conni Slipper.   PRIMARY CARE PHYSICIAN: Dr. Graylon Good.   CHIEF COMPLAINT: Lower extremity pain.   HISTORY OF PRESENT ILLNESS: This 49 year old female with a known morbid obesity,   chronic back pain, chronic pain syndrome, diabetes mellitus, obstructive sleep apnea, presents with complaints of bilateral lower extremity pain, the patient reports pain has been going on for the last 2 to 3 days, reports some warmth in her lower extremity. The patient had as well worsening swelling. She had lymphedema and had lower extremity, the patient was found to be febrile at 100.2 mild leukocytosis of 12,000. The patient was noticed to have lower extremity erythema and swelling. She was started on IV vancomycin and Zosyn in ED out of concern of cellulitis, after blood cultures were sent. The patient reports she was hospitalized 2 years ago in [place] for cellulitis where she is hospitalized for 2 days for IV antibiotic administration. She does not recall if she had MRSA or not then.    The patient appears to be lethargic, but she is arousable and coherent. Reports she took her pain medicine around 10:00 p.m. The patient appears to be on large dose pain medicines reports her pain medical physician is in [place]. She denies any chest pain, any shortness of breath any dysuria, polyuria, cough, productive sputum.   PAST MEDICAL HISTORY:  1. Diabetes mellitus.  2. Morbid obesity.  3. Obstructive sleep apnea.  4. Lymphedema.  5. Neuropathic.  6. Chronic pain syndrome.   PAST SURGICAL HISTORY:  1. Back surgery.  2. Neck surgery.  3. Knee replacement surgery.   SOCIAL HISTORY: Smokes a half pack per day. No alcohol. No illicit drug use.   FAMILY HISTORY: Significant for diabetes mellitus and hypertension in both of her parent's.   ALLERGIES: No known drug  allergies.   HOME MEDICATIONS: Lantus 60 units subcutaneous daily, Lantus 40 units subcutaneous at bedtime.   MEDICATIONS:  1. Lyrica 150 mg oral 2 times a day.  2. Oxymorphone 30 mg oral 2 times a day.  3. Oxycodone 15 mg every 4 hours as needed.   REVIEW OF SYSTEMS: CONSTITUTIONAL: Reports fever, chills, fatigue, weakness. Denies weight gain, weight loss.  EYES: Denies blurry vision, double vision, inflammation.  ENT: Denies tinnitus, ear pain, hearing loss, epistaxis.  RESPIRATORY: Denies cough, wheezing, hemoptysis, chronic obstructive pulmonary disease.  CARDIOVASCULAR: Denies chest pain, orthopnea, palpitations, syncope.  GASTROINTESTINAL: Denies nausea, vomiting, diarrhea, abdominal pain, hematemesis.  GENITOURINARY: Denies dysuria, hematuria, or renal colic.  ENDOCRINE: Denies polyuria, polydipsia, heat or cold intolerance.  HEMATOLOGY: Denies anemia, easy bruising, bleeding diathesis.  INTEGUMENT: Denies acne, skin lesions reports bilateral lower extremity erythema and swelling.  MUSCULOSKELETAL: Reports history of chronic neck pain, back pain, arthritis. Denies any cramps or gout.  NEUROLOGIC: Denies CVA, TIA, tremors, vertigo, ataxia.  PSYCHIATRIC: Denies anxiety, insomnia, or depression.   PHYSICAL EXAMINATION:  VITAL SIGNS: Temperature T-max 100.2, currently 98.5, pulse 106, respiratory rate 14, blood pressure 98/81, saturating 95% on room air.  GENERAL: Morbidly obese female who looks comfortable in bed, in no apparent distress sleeping comfortably, appears to be lethargic but the patient is responsive to verbal stimuli and able to hold conversation, but then she goes back to sleep right away.   HEENT: Head atraumatic, normocephalic. Pupils equal, reactive to light. Pink conjunctivae. Anicteric sclerae. Moist oral mucosa. No pharyngeal erythema.  No nasal discharge.  NECK: Supple. No thyromegaly. No JVD. No carotid bruits rigidity. Trachea is midline.  CHEST: Good air entry  bilaterally. No wheezing, rales, rhonchi. No use of accessory muscles.  CARDIOVASCULAR: S1, S2 heard. No rubs, murmur or gallops. PMI is nondisplaced.  ABDOMEN: Obese, soft, nontender, nondistended. Bowel sounds present. No rebound, no guarding.  EXTREMITIES: Has a chronic lower extremity skin changes. As well appears to be having lymphedema with mild erythema involving the lower extremities. Pedal pulses felt bilaterally. No clubbing. No cyanosis.  MUSCULOSKELETAL: No joint effusion or erythema appreciated.  PSYCHIATRIC: The patient is awake, alert, oriented x 3. Judgment and insight as  mentioned.  The patient appears to be lethargic sleeps, but she is awake and answered questions appropriately then go back to sleep.  NEUROLOGIC: Cranial nerves grossly intact. Motor 5/5. No focal deficits. Sensation is symmetrical and intact.  LYMPHATICS: No cervical or supraclavicular lymphadenopathy.   PERTINENT LABORATORY DATA: Glucose 147, BUN 9, creatinine 0.32, sodium 136, potassium 3.9, chloride 99, CO2 29. White blood cells 12.1, hemoglobin 12.3, hematocrit 37.9, platelets 227.   ASSESSMENT AND PLAN:  1. Cellulitis. The patient has bilateral lower extremity cellulitis with mild fever, leukocytosis and tachycardia so she meets sepsis criteria. Blood cultures were sent. We will continue on IV vancomycin and Zosyn , will sdjust  antibiotics once the blood cultures are results are obtained.  2. Diabetes mellitus. The patient will be resumed back on her Lantus but at a lower dose. We will decrease daytime dose from 60 to 45 and the nighttime dose from 40 to 30. We will add insulin sliding scale.  3. Obstructive, sleep apnea. We will resume on CPAP at bedtime.  4. Tobacco abuse. The patient was counseled.  5. Chronic pain syndrome. We will resume the patient at a lower dose of her home medication given the fact she is lethargic at this point. We will resume her back on oxymorphone 50 mg p.o. b.i.d., but the next  dose to start in the p.m. and p.r.n. and oxycodone. 5 mg every 4 hours as needed and we will hold for sedation. hypoventilation or hypotension.  6. Deep vein thrombosis prophylaxis. Subcutaneous heparin.   CODE STATUS: Full code.   TOTAL TIME SPENT ON ADMISSION AND PATIENT CARE: 55 minutes.    ____________________________ Albertine Patricia, MD dse:JT D: 05/14/2014 04:43:55 ET T: 05/14/2014 05:19:11 ET JOB#: [id number]  cc: Albertine Patricia, MD, <Dictator> Emely Fahy Graciela Husbands MD ELECTRONICALLY SIGNED 05/24/2014 23:36

## 2015-01-18 ENCOUNTER — Encounter (HOSPITAL_COMMUNITY): Payer: Self-pay | Admitting: *Deleted

## 2015-01-18 MED ORDER — DEXTROSE 5 % IV SOLN
3.0000 g | INTRAVENOUS | Status: AC
  Administered 2015-01-19: 3 g via INTRAVENOUS
  Filled 2015-01-18: qty 3000

## 2015-01-18 NOTE — H&P (Signed)
HISTORY AND PHYSICAL  [name] is a 49 y.o. female patient with [id number] teeth  No diagnosis found.  Past Medical History  Diagnosis Date  . Diabetes mellitus   . Hypertension   . Family history of adverse reaction to anesthesia     Mother is hard to arouse and blood pressure drops  . Sleep apnea   . GERD (gastroesophageal reflux disease)   . Diabetic neuropathy   . Arthritis     No current facility-administered medications for this encounter.   Current Outpatient Prescriptions  Medication Sig Dispense Refill  . ALPRAZolam (XANAX) 1 MG tablet Take 1 mg by mouth 3 (three) times daily as needed for anxiety.    Marland Kitchen amphetamine-dextroamphetamine (ADDERALL XR) 20 MG 24 hr capsule Take 20 mg by mouth every morning.    . carisoprodol (SOMA) 350 MG tablet Take 350 mg by mouth 3 (three) times daily as needed for muscle spasms.     Marland Kitchen HYDROmorphone (DILAUDID) 4 MG tablet Take 1 tablet (4 mg total) by mouth every 4 (four) hours as needed for pain. 30 tablet 0  . insulin aspart (NOVOLOG FLEXPEN) 100 UNIT/ML injection Inject 5 Units into the skin 3 (three) times daily before meals. 1 vial 6  . insulin aspart (NOVOLOG) 100 UNIT/ML injection Inject 2-6 Units into the skin 3 (three) times daily before meals. Per sliding scale    . insulin glargine (LANTUS) 100 UNIT/ML injection Inject 60 Units into the skin 2 (two) times daily.    Marland Kitchen lisinopril-hydrochlorothiazide (PRINZIDE,ZESTORETIC) 20-25 MG per tablet Take 1 tablet by mouth daily as needed (for elevated blood pressure).     . medroxyPROGESTERone (DEPO-SUBQ PROVERA) 104 MG/0.65ML injection Inject 104 mg into the skin every 3 (three) months.    Marland Kitchen oxycodone (ROXICODONE) 30 MG immediate release tablet Take 30 mg by mouth every 4 (four) hours as needed. pain    . pregabalin (LYRICA) 150 MG capsule Take 1 capsule (150 mg total) by mouth 2 (two) times daily. 60 capsule 0   Allergies  Allergen Reactions  . Acetaminophen Rash    Upset stomach    Active Problems:   * No active hospital problems. *  Vitals: Last menstrual period 11/09/2012. Lab results:No results found for this or any previous visit (from the past 12 hour(s)). Radiology Results: No results found. General appearance: alert, cooperative, no distress and morbidly obese Head: Normocephalic, without obvious abnormality, atraumatic Eyes: negative Nose: Nares normal. Septum midline. Mucosa normal. No drainage or sinus tenderness. Throat: Severe dental caries # 3, 4, 5, 7, 8, 9, 10, 11, 12, 14, 18, 21, 28, 29, 31. Pharynx clear, No trismus Neck: no adenopathy, supple, symmetrical, trachea midline and thyroid not enlarged, symmetric, no tenderness/mass/nodules Resp: clear to auscultation bilaterally Cardio: regular rate and rhythm, S1, S2 normal, no murmur, click, rub or gallop  Assessment: 62 F Morbid Obesity, IDDM, HTN, OSA with Dental caries, multiple nonrestorable teeth  Plan:Dental extractions and alveoloplasty. General anesthesia. Day surgery.   Emmalene Kattner M 01/18/2015

## 2015-01-18 NOTE — Progress Notes (Addendum)
Pt denies SOB, chest pain, and being under the care of a cardiologist. Pt denies having a chest x ray and EKG within the last year. Pt denies having a cardiac cath but stated that she had a stress test ( not sure if had echo) at [facility]; records requested. Pt made aware to not take any diabetic medications the morning of procedure. Pt made aware to stop Aspirin, NSAID's, otc vitamins and herbal medications. Pt verbalized understanding of all pre-op instructions.

## 2015-01-19 ENCOUNTER — Ambulatory Visit (HOSPITAL_COMMUNITY): Payer: Medicare Other | Admitting: Anesthesiology

## 2015-01-19 ENCOUNTER — Encounter (HOSPITAL_COMMUNITY)
Admission: RE | Disposition: A | Discharge status: Home / Self Care | Payer: Self-pay | Source: Ambulatory Visit | Attending: Oral Surgery

## 2015-01-19 ENCOUNTER — Encounter (HOSPITAL_COMMUNITY): Payer: Self-pay | Admitting: *Deleted

## 2015-01-19 ENCOUNTER — Ambulatory Visit (HOSPITAL_COMMUNITY)
Admission: RE | Admit: 2015-01-19 | Discharge: 2015-01-19 | Disposition: A | Discharge status: Home / Self Care | Payer: Medicare Other | Source: Ambulatory Visit | Attending: Oral Surgery | Admitting: Oral Surgery

## 2015-01-19 DIAGNOSIS — M1990 Unspecified osteoarthritis, unspecified site: Secondary | ICD-10-CM | POA: Insufficient documentation

## 2015-01-19 DIAGNOSIS — Z794 Long term (current) use of insulin: Secondary | ICD-10-CM | POA: Insufficient documentation

## 2015-01-19 DIAGNOSIS — K029 Dental caries, unspecified: Secondary | ICD-10-CM | POA: Diagnosis present

## 2015-01-19 DIAGNOSIS — G4733 Obstructive sleep apnea (adult) (pediatric): Secondary | ICD-10-CM | POA: Insufficient documentation

## 2015-01-19 DIAGNOSIS — I10 Essential (primary) hypertension: Secondary | ICD-10-CM | POA: Diagnosis not present

## 2015-01-19 DIAGNOSIS — F17210 Nicotine dependence, cigarettes, uncomplicated: Secondary | ICD-10-CM | POA: Diagnosis not present

## 2015-01-19 DIAGNOSIS — Z96653 Presence of artificial knee joint, bilateral: Secondary | ICD-10-CM | POA: Diagnosis not present

## 2015-01-19 DIAGNOSIS — F329 Major depressive disorder, single episode, unspecified: Secondary | ICD-10-CM | POA: Diagnosis not present

## 2015-01-19 DIAGNOSIS — K219 Gastro-esophageal reflux disease without esophagitis: Secondary | ICD-10-CM | POA: Insufficient documentation

## 2015-01-19 DIAGNOSIS — Z6841 Body Mass Index (BMI) 40.0 and over, adult: Secondary | ICD-10-CM | POA: Diagnosis not present

## 2015-01-19 DIAGNOSIS — E1140 Type 2 diabetes mellitus with diabetic neuropathy, unspecified: Secondary | ICD-10-CM | POA: Diagnosis not present

## 2015-01-19 HISTORY — DX: Family history of other specified conditions: Z84.89

## 2015-01-19 HISTORY — DX: Type 2 diabetes mellitus with diabetic neuropathy, unspecified: E11.40

## 2015-01-19 HISTORY — PX: MULTIPLE EXTRACTIONS WITH ALVEOLOPLASTY: SHX5342

## 2015-01-19 HISTORY — DX: Gastro-esophageal reflux disease without esophagitis: K21.9

## 2015-01-19 HISTORY — DX: Sleep apnea, unspecified: G47.30

## 2015-01-19 HISTORY — DX: Unspecified osteoarthritis, unspecified site: M19.90

## 2015-01-19 LAB — CBC
HCT: 40.1 % (ref 36.0–46.0)
Hemoglobin: 13.1 g/dL (ref 12.0–15.0)
MCH: 30.4 pg (ref 26.0–34.0)
MCHC: 32.7 g/dL (ref 30.0–36.0)
MCV: 93 fL (ref 78.0–100.0)
PLATELETS: 231 10*3/uL (ref 150–400)
RBC: 4.31 MIL/uL (ref 3.87–5.11)
RDW: 13.8 % (ref 11.5–15.5)
WBC: 8.6 10*3/uL (ref 4.0–10.5)

## 2015-01-19 LAB — GLUCOSE, CAPILLARY
GLUCOSE-CAPILLARY: 221 mg/dL — AB (ref 70–99)
Glucose-Capillary: 178 mg/dL — ABNORMAL HIGH (ref 70–99)

## 2015-01-19 LAB — BASIC METABOLIC PANEL
Anion gap: 7 (ref 5–15)
BUN: 15 mg/dL (ref 6–20)
CO2: 28 mmol/L (ref 22–32)
CREATININE: 0.74 mg/dL (ref 0.44–1.00)
Calcium: 8.7 mg/dL — ABNORMAL LOW (ref 8.9–10.3)
Chloride: 102 mmol/L (ref 101–111)
GFR calc non Af Amer: >60 mL/min (ref >60)
Glucose, Bld: 192 mg/dL — ABNORMAL HIGH (ref 70–99)
Potassium: 4.2 mmol/L (ref 3.5–5.1)
Sodium: 137 mmol/L (ref 135–145)

## 2015-01-19 SURGERY — MULTIPLE EXTRACTION WITH ALVEOLOPLASTY
Anesthesia: General | Site: Mouth

## 2015-01-19 MED ORDER — LACTATED RINGERS IV SOLN
INTRAVENOUS | Status: DC | PRN
  Administered 2015-01-19: 07:00:00 via INTRAVENOUS

## 2015-01-19 MED ORDER — LIDOCAINE HCL (CARDIAC) 20 MG/ML IV SOLN
INTRAVENOUS | Status: AC
  Filled 2015-01-19: qty 5

## 2015-01-19 MED ORDER — LACTATED RINGERS IV SOLN: INTRAVENOUS | Status: DC

## 2015-01-19 MED ORDER — FENTANYL CITRATE (PF) 250 MCG/5ML IJ SOLN
INTRAMUSCULAR | Status: AC
  Filled 2015-01-19: qty 5

## 2015-01-19 MED ORDER — SUCCINYLCHOLINE CHLORIDE 20 MG/ML IJ SOLN
INTRAMUSCULAR | Status: DC | PRN
  Administered 2015-01-19 (×2): 20 mg via INTRAVENOUS
  Administered 2015-01-19: 140 mg via INTRAVENOUS

## 2015-01-19 MED ORDER — ONDANSETRON HCL 4 MG/2ML IJ SOLN
INTRAMUSCULAR | Status: DC | PRN
Start: 2015-01-19 — End: 2015-01-19
  Administered 2015-01-19: 4 mg via INTRAVENOUS

## 2015-01-19 MED ORDER — HYDROMORPHONE HCL 1 MG/ML IJ SOLN
0.2500 mg | INTRAMUSCULAR | Status: DC | PRN
  Administered 2015-01-19 (×2): 0.5 mg via INTRAVENOUS

## 2015-01-19 MED ORDER — ONDANSETRON HCL 4 MG/2ML IJ SOLN
INTRAMUSCULAR | Status: AC
  Filled 2015-01-19: qty 2

## 2015-01-19 MED ORDER — GLYCOPYRROLATE 0.2 MG/ML IJ SOLN
INTRAMUSCULAR | Status: AC
  Filled 2015-01-19: qty 1

## 2015-01-19 MED ORDER — OXYMETAZOLINE HCL 0.05 % NA SOLN
NASAL | Status: AC
  Filled 2015-01-19: qty 15

## 2015-01-19 MED ORDER — 0.9 % SODIUM CHLORIDE (POUR BTL) OPTIME
TOPICAL | Status: DC | PRN
  Administered 2015-01-19: 1000 mL

## 2015-01-19 MED ORDER — PROPOFOL 10 MG/ML IV BOLUS
INTRAVENOUS | Status: DC | PRN
  Administered 2015-01-19: 200 mg via INTRAVENOUS

## 2015-01-19 MED ORDER — ARTIFICIAL TEARS OP OINT
TOPICAL_OINTMENT | OPHTHALMIC | Status: DC | PRN
  Administered 2015-01-19: 1 via OPHTHALMIC

## 2015-01-19 MED ORDER — LIDOCAINE HCL (CARDIAC) 20 MG/ML IV SOLN
INTRAVENOUS | Status: DC | PRN
  Administered 2015-01-19: 80 mg via INTRAVENOUS

## 2015-01-19 MED ORDER — SUCCINYLCHOLINE CHLORIDE 20 MG/ML IJ SOLN
INTRAMUSCULAR | Status: AC
  Filled 2015-01-19: qty 1

## 2015-01-19 MED ORDER — OXYMETAZOLINE HCL 0.05 % NA SOLN
NASAL | Status: DC | PRN
  Administered 2015-01-19: 3 via NASAL

## 2015-01-19 MED ORDER — SODIUM CHLORIDE 0.9 % IR SOLN
Status: DC | PRN
  Administered 2015-01-19: 1

## 2015-01-19 MED ORDER — MEPERIDINE HCL 25 MG/ML IJ SOLN: 6.2500 mg | INTRAMUSCULAR | Status: DC | PRN

## 2015-01-19 MED ORDER — MIDAZOLAM HCL 2 MG/2ML IJ SOLN
INTRAMUSCULAR | Status: AC
  Filled 2015-01-19: qty 2

## 2015-01-19 MED ORDER — FENTANYL CITRATE (PF) 100 MCG/2ML IJ SOLN
INTRAMUSCULAR | Status: DC | PRN
  Administered 2015-01-19: 50 ug via INTRAVENOUS

## 2015-01-19 MED ORDER — HYDROMORPHONE HCL 1 MG/ML IJ SOLN
INTRAMUSCULAR | Status: AC
  Administered 2015-01-19: 0.5 mg via INTRAVENOUS
  Filled 2015-01-19: qty 1

## 2015-01-19 MED ORDER — LIDOCAINE-EPINEPHRINE 2 %-1:100000 IJ SOLN
INTRAMUSCULAR | Status: DC | PRN
  Administered 2015-01-19: 20 mL via INTRADERMAL
  Administered 2015-01-19: 7 mL via INTRADERMAL

## 2015-01-19 MED ORDER — IBUPROFEN 800 MG PO TABS: 800.0000 mg | ORAL_TABLET | Freq: Three times a day (TID) | ORAL | Status: DC | PRN

## 2015-01-19 MED ORDER — ROCURONIUM BROMIDE 50 MG/5ML IV SOLN
INTRAVENOUS | Status: AC
  Filled 2015-01-19: qty 1

## 2015-01-19 MED ORDER — LIDOCAINE-EPINEPHRINE 2 %-1:100000 IJ SOLN
INTRAMUSCULAR | Status: AC
  Filled 2015-01-19: qty 1

## 2015-01-19 MED ORDER — MIDAZOLAM HCL 5 MG/5ML IJ SOLN
INTRAMUSCULAR | Status: DC | PRN
  Administered 2015-01-19: 1 mg via INTRAVENOUS

## 2015-01-19 MED ORDER — AMOXICILLIN 500 MG PO CAPS: 500.0000 mg | ORAL_CAPSULE | Freq: Three times a day (TID) | ORAL | Status: DC

## 2015-01-19 MED ORDER — PROMETHAZINE HCL 25 MG/ML IJ SOLN: 6.2500 mg | INTRAMUSCULAR | Status: DC | PRN

## 2015-01-19 SURGICAL SUPPLY — 26 items
BUR CROSS CUT FISSURE 1.6 (BURR) ×2 IMPLANT
BUR EGG ELITE 4.0 (BURR) ×2 IMPLANT
CANISTER SUCTION 2500CC (MISCELLANEOUS) ×2 IMPLANT
COVER SURGICAL LIGHT HANDLE (MISCELLANEOUS) ×2 IMPLANT
CRADLE DONUT ADULT HEAD (MISCELLANEOUS) ×2 IMPLANT
FLUID NSS /IRRIG 1000 ML XXX (MISCELLANEOUS) ×2 IMPLANT
GAUZE PACKING FOLDED 2  STR (GAUZE/BANDAGES/DRESSINGS) ×1
GAUZE PACKING FOLDED 2 STR (GAUZE/BANDAGES/DRESSINGS) ×1 IMPLANT
GLOVE BIO SURGEON STRL SZ 6.5 (GLOVE) ×2 IMPLANT
GLOVE BIO SURGEON STRL SZ7.5 (GLOVE) ×2 IMPLANT
GLOVE BIOGEL PI IND STRL 7.0 (GLOVE) ×1 IMPLANT
GLOVE BIOGEL PI INDICATOR 7.0 (GLOVE) ×1
GOWN STRL REUS W/ TWL LRG LVL3 (GOWN DISPOSABLE) ×1 IMPLANT
GOWN STRL REUS W/ TWL XL LVL3 (GOWN DISPOSABLE) ×1 IMPLANT
GOWN STRL REUS W/TWL LRG LVL3 (GOWN DISPOSABLE) ×1
GOWN STRL REUS W/TWL XL LVL3 (GOWN DISPOSABLE) ×1
KIT BASIN OR (CUSTOM PROCEDURE TRAY) ×2 IMPLANT
KIT ROOM TURNOVER OR (KITS) ×2 IMPLANT
NEEDLE 22X1 1/2 (OR ONLY) (NEEDLE) ×4 IMPLANT
NS IRRIG 1000ML POUR BTL (IV SOLUTION) ×2 IMPLANT
PAD ARMBOARD 7.5X6 YLW CONV (MISCELLANEOUS) ×2 IMPLANT
SUT CHROMIC 3 0 PS 2 (SUTURE) ×4 IMPLANT
SYR CONTROL 10ML LL (SYRINGE) ×4 IMPLANT
TRAY ENT MC OR (CUSTOM PROCEDURE TRAY) ×2 IMPLANT
TUBING IRRIGATION (MISCELLANEOUS) ×2 IMPLANT
YANKAUER SUCT BULB TIP NO VENT (SUCTIONS) ×4 IMPLANT

## 2015-01-19 NOTE — Addendum Note (Signed)
Addendum  created 01/19/15 1828 by Izora Gala, CRNA   Modules edited: Anesthesia Events, Narrator   Narrator:  Narrator: Event Log Edited

## 2015-01-19 NOTE — H&P (Signed)
H&P documentation  -History and Physical Reviewed  -Patient has been re-examined  -No change in the plan of care  [name] M  

## 2015-01-19 NOTE — Op Note (Signed)
NAMEMIRIAH, HOCK NO.:  [id number]  MEDICAL RECORD NO.:  [id number]  LOCATION:  [facility]                         FACILITY:  [facility]  PHYSICIAN:  Gae Bon, M.D.  DATE OF BIRTH:  08-19-1966  DATE OF PROCEDURE:  01/19/2015 DATE OF DISCHARGE:                              OPERATIVE REPORT   PREOPERATIVE DIAGNOSIS:  Nonrestorable teeth #3, #4, #5, #7, #8, #9, #10, #11, #12, #14, #18, #21, #28, #29, and #31.  POSTOPERATIVE DIAGNOSIS:  Nonrestorable teeth #3, #4, #5, #7, #8, #9, #10, #11, #12, #14, #18, #21, #28, #29, and #31.  PROCEDURE:  Extraction of teeth #3, #4, #5, #7, #8, #9, #10, #11, #12, #14, #18, #21, #28, #29, and #31, alveoplasty right and left maxilla and mandible.  SURGEON:  Gae Bon, M.D.  ANESTHESIA:  General.  PROCEDURE:  The patient was taken to the operating room, placed on the table in a supine position.  General anesthesia was administered and a nasal endotracheal tube was placed and secured.  The eyes were protected.  The patient was draped for the procedure.  Time-out was performed.  The posterior pharynx was suctioned and throat pack was placed.  2% lidocaine with 1:100,000 epinephrine was infiltrated in an inferior alveolar block on the right and left side and buccal infiltration in the mandible and buccal and palatal infiltration in the right and left maxilla.  Total of 20 mL was utilized.  A 15-blade was used to make an incision beginning at tooth #18, carried forward along the crest of the ridge to tooth #21.  The periosteum was reflected in this area with a periosteal elevator.  Then, a 15-blade was used to make an incision beginning at tooth #14, carrying it anteriorly to tooth #7 and the gingival sulcus buccal and palatal surfaces in the left maxilla. The periosteum was reflected here with periosteal elevator.  Then, the 301 elevator was used to elevate the teeth in the left maxilla and mandible.  The dental forceps was  used to remove the teeth.  Tooth #11 fractured and required additional bone removal, then other teeth were removed with dental forceps.  The sockets were then curetted and then the alveolar crest was exposed using the periosteal elevator and the alveoplasty was performed using the egg-shaped bur and bone file.  Then, this area was closed with 3-0 chromic.  The right side was then operated next.  A 15-blade was used to make an incision beginning in the right mandible on the buccal and lingual surfaces of teeth #28, #29, and #31 and in the maxilla around teeth #3, #4, #5.  The periosteum was reflected.  The teeth were elevated with a 301 elevator and removed with the forceps.  The sockets were then curetted and the periosteum was reflected to expose the alveolar crest and then the alveoplasty was performed using the egg-shaped bur and bone file.  Then, the sockets were irrigated and closed with 3-0 chromic.  The oral cavity was then irrigated and suctioned and inspected, found to have good contour, hemostasis, and closure.  The patient was then awakened, taken to the recovery room, breathing spontaneously in good condition.  ESTIMATED BLOOD LOSS:  Minimal.  COMPLICATIONS:  None.  SPECIMENS:  None.     Gae Bon, M.D.     SMJ/MEDQ  D:  01/19/2015  T:  01/19/2015  Job:  [id number]

## 2015-01-19 NOTE — Anesthesia Postprocedure Evaluation (Signed)
Anesthesia Post Note  Patient: [name]  Procedure(s) Performed: Procedure(s) (LRB): MULTIPLE EXTRACTIONS Three, Four, Five, Seven, eight, nine, ten, eleven, twelve, fourteen, eighteen, twenty-one, twenty-eight, twenty-nine, thirty-one with Alveoloplasty.   ([name])  Anesthesia type: General  Patient location: PACU  Post pain: Pain level controlled  Post assessment: Post-op Vital signs reviewed  Last Vitals: BP 155/76 mmHg  Pulse 103  Temp(Src) 36.8 C (Oral)  Resp 20  Ht 5\' 3"  (1.6 m)  Wt 374 lb (169.645 kg)  BMI 66.27 kg/m2  SpO2 93%  LMP 11/09/2012  Post vital signs: Reviewed  Level of consciousness: sedated  Complications: No apparent anesthesia complications

## 2015-01-19 NOTE — Transfer of Care (Signed)
Immediate Anesthesia Transfer of Care Note  Patient: [name]  Procedure(s) Performed: Procedure(s): MULTIPLE EXTRACTIONS Three, Four, Five, Seven, eight, nine, ten, eleven, twelve, fourteen, eighteen, twenty-one, twenty-eight, twenty-nine, thirty-one with Alveoloplasty.   ([name])  Patient Location: PACU  Anesthesia Type:General  Level of Consciousness: awake, sedated and patient cooperative  Airway & Oxygen Therapy: Patient Spontanous Breathing and Patient connected to face mask oxygen  Post-op Assessment: Report given to RN, Post -op Vital signs reviewed and stable and Patient moving all extremities  Post vital signs: Reviewed and stable  Last Vitals:  Filed Vitals:   01/19/15 0642  BP: 143/71  Pulse: 92  Temp: 36.8 C  Resp: 16    Complications: No apparent anesthesia complications

## 2015-01-19 NOTE — Anesthesia Preprocedure Evaluation (Addendum)
Anesthesia Evaluation  Patient identified by MRN, date of birth, ID band Patient awake    Reviewed: Allergy & Precautions, NPO status , Patient's Chart, lab work & pertinent test results  History of Anesthesia Complications Negative for: history of anesthetic complications  Airway Mallampati: II  TM Distance: >3 FB Neck ROM: Full    Dental no notable dental hx.    Pulmonary sleep apnea , Current Smoker,  breath sounds clear to auscultation  Pulmonary exam normal       Cardiovascular hypertension, negative cardio ROS Normal cardiovascular examRhythm:Regular Rate:Normal     Neuro/Psych PSYCHIATRIC DISORDERS Depression negative neurological ROS     GI/Hepatic Neg liver ROS, GERD-  ,  Endo/Other  negative endocrine ROSdiabetes, Type 2, Insulin DependentMorbid obesity  Renal/GU negative Renal ROS     Musculoskeletal  (+) Arthritis -,   Abdominal   Peds  Hematology negative hematology ROS (+)   Anesthesia Other Findings   Reproductive/Obstetrics negative OB ROS                            Anesthesia Physical Anesthesia Plan  ASA: IV  Anesthesia Plan: General   Post-op Pain Management:    Induction: Intravenous  Airway Management Planned: Nasal ETT  Additional Equipment:   Intra-op Plan:   Post-operative Plan: Extubation in OR  Informed Consent: I have reviewed the patients History and Physical, chart, labs and discussed the procedure including the risks, benefits and alternatives for the proposed anesthesia with the patient or authorized representative who has indicated his/her understanding and acceptance.   Dental advisory given  Plan Discussed with: CRNA  Anesthesia Plan Comments:         Anesthesia Quick Evaluation

## 2015-01-19 NOTE — Progress Notes (Signed)
Pt and daughter given discharge instructions, pt sstating she wants to go visit her mother today, instructed by this discharge RN that it is not recommended that she go over there and walk around, she should go home and rest.  States she will go anyway.  All dc instructions given, peri care completed prior to getting dressed, pt ambulated to wheelchair with walker, dc home with daughter.

## 2015-01-19 NOTE — OR Nursing (Signed)
Throat pack out at 0823.

## 2015-01-19 NOTE — Anesthesia Procedure Notes (Signed)
Procedure Name: Intubation Date/Time: 01/19/2015 7:41 AM Performed by: Izora Gala Pre-anesthesia Checklist: Patient identified, Emergency Drugs available, Suction available and Patient being monitored Patient Re-evaluated:Patient Re-evaluated prior to inductionOxygen Delivery Method: Circle system utilized Preoxygenation: Pre-oxygenation with 100% oxygen Intubation Type: IV induction Ventilation: Mask ventilation without difficulty and Oral airway inserted - appropriate to patient size Laryngoscope Size: Miller and 3 Grade View: Grade II Nasal Tubes: Right Tube size: 7.0 mm Number of attempts: 1 Placement Confirmation: ETT inserted through vocal cords under direct vision and positive ETCO2 Secured at: 23 cm Tube secured with: Tape Dental Injury: Teeth and Oropharynx as per pre-operative assessment

## 2015-01-19 NOTE — Progress Notes (Signed)
TO MARIA RN AS CARGIVER

## 2015-01-19 NOTE — Op Note (Signed)
01/19/2015  8:27 AM  PATIENT:  Ena Dawley  49 y.o. female  PRE-OPERATIVE DIAGNOSIS:  NON RESTORABLE TEETH #'s 3, 4, 5, 7, 8, 9, 10, 11, 12, 14, 18, 21, 28, 29, 31  POST-OPERATIVE DIAGNOSIS:  SAME  PROCEDURE:  Procedure(s): MULTIPLE EXTRACTIONS TEETH #'s 3, 4, 5, 7, 8, 9, 10, 11, 12, 14, 18, 21, 28, 29, 31; ALVEOLOPLASTY  SURGEON:  Surgeon(s): Diona Browner, DDS  ANESTHESIA:   local and general  EBL:  minimal  DRAINS: none   SPECIMEN:  No Specimen  COUNTS:  YES  PLAN OF CARE: Discharge to home after PACU  PATIENT DISPOSITION:  PACU - hemodynamically stable.   PROCEDURE DETAILS: Dictation # [id number]  Gae Bon, DMD 01/19/2015 8:27 AM

## 2015-01-19 NOTE — OR Nursing (Signed)
Throat pack in at 0749.

## 2015-01-22 ENCOUNTER — Encounter (HOSPITAL_COMMUNITY): Payer: Self-pay | Admitting: Oral Surgery

## 2016-01-17 ENCOUNTER — Encounter (HOSPITAL_COMMUNITY): Payer: Self-pay

## 2016-01-17 ENCOUNTER — Emergency Department (HOSPITAL_COMMUNITY): Payer: Medicare Other

## 2016-01-17 ENCOUNTER — Emergency Department (HOSPITAL_COMMUNITY)
Admission: EM | Admit: 2016-01-17 | Discharge: 2016-01-17 | Disposition: A | Discharge status: Home / Self Care | Payer: Medicare Other | Attending: Emergency Medicine | Admitting: Emergency Medicine

## 2016-01-17 DIAGNOSIS — E1140 Type 2 diabetes mellitus with diabetic neuropathy, unspecified: Secondary | ICD-10-CM | POA: Diagnosis not present

## 2016-01-17 DIAGNOSIS — T464X5A Adverse effect of angiotensin-converting-enzyme inhibitors, initial encounter: Secondary | ICD-10-CM | POA: Diagnosis not present

## 2016-01-17 DIAGNOSIS — M1990 Unspecified osteoarthritis, unspecified site: Secondary | ICD-10-CM | POA: Insufficient documentation

## 2016-01-17 DIAGNOSIS — R05 Cough: Secondary | ICD-10-CM

## 2016-01-17 DIAGNOSIS — Z79899 Other long term (current) drug therapy: Secondary | ICD-10-CM | POA: Insufficient documentation

## 2016-01-17 DIAGNOSIS — Z8669 Personal history of other diseases of the nervous system and sense organs: Secondary | ICD-10-CM | POA: Diagnosis not present

## 2016-01-17 DIAGNOSIS — F17210 Nicotine dependence, cigarettes, uncomplicated: Secondary | ICD-10-CM | POA: Diagnosis not present

## 2016-01-17 DIAGNOSIS — J988 Other specified respiratory disorders: Secondary | ICD-10-CM

## 2016-01-17 DIAGNOSIS — Z794 Long term (current) use of insulin: Secondary | ICD-10-CM | POA: Diagnosis not present

## 2016-01-17 DIAGNOSIS — J069 Acute upper respiratory infection, unspecified: Secondary | ICD-10-CM | POA: Diagnosis not present

## 2016-01-17 DIAGNOSIS — Z8719 Personal history of other diseases of the digestive system: Secondary | ICD-10-CM | POA: Insufficient documentation

## 2016-01-17 DIAGNOSIS — B9789 Other viral agents as the cause of diseases classified elsewhere: Secondary | ICD-10-CM

## 2016-01-17 DIAGNOSIS — R058 Other specified cough: Secondary | ICD-10-CM

## 2016-01-17 DIAGNOSIS — I10 Essential (primary) hypertension: Secondary | ICD-10-CM | POA: Diagnosis not present

## 2016-01-17 MED ORDER — BENZONATATE 100 MG PO CAPS: 100.0000 mg | ORAL_CAPSULE | Freq: Three times a day (TID) | ORAL | Status: DC

## 2016-01-17 NOTE — ED Notes (Signed)
Pt here with c/o productive cough and reports clear sputum and is blood tinged, onset two days ago. She also reports mild HA. Denies any other symptoms.

## 2016-01-17 NOTE — ED Provider Notes (Signed)
CSN: [id number]     Arrival date & time 01/17/16  1719 History   First MD Initiated Contact with Patient 01/17/16 2045     Chief Complaint  Patient presents with  . Cough     (Consider location/radiation/quality/duration/timing/severity/associated sxs/prior Treatment) HPI   50 year old female with history of insulin-dependent diabetes, newly diagnosed hypertension, GERD presenting for complaints of cough. Patient was diagnosed with hypertension last month and was started on lisinopril.  Pt states she has 11 pack year hx and normal have a cough is the productive in nature on occasion.  For the past 2 days she notice some blood tinge in her sputum.  Has subjective fever, chills, sinus headache.  Wonder if it's allergy.  Has tried ibuprofen along with oxycodone and sts it has help with her headache.  Pt primarily concern about the blood in her sputum.  Denies cp, sob, DOE, orthopnea, epistaxis, recent travel, incarceration, angioedema like sxs, unilateral leg swelling, active cancer, prolonged bed rest, recent injury, or prior PE/DVT.         Past Medical History  Diagnosis Date  . Diabetes mellitus   . Hypertension   . Family history of adverse reaction to anesthesia     Mother is hard to arouse and blood pressure drops  . Sleep apnea   . GERD (gastroesophageal reflux disease)   . Diabetic neuropathy ([place])   . Arthritis    Past Surgical History  Procedure Laterality Date  . Spinal chord repair  2011  . Replacement total knee bilateral    . Carpal tunnel repair    . Joint replacement      bllateral total knees  . Cholecystectomy  1999  . Toenails removed from bilateral great toes    . Tonsillectomy    . Multiple extractions with alveoloplasty [name] 01/19/2015    Procedure: MULTIPLE EXTRACTIONS Three, Four, Five, Seven, eight, nine, ten, eleven, twelve, fourteen, eighteen, twenty-one, twenty-eight, twenty-nine, thirty-one with Alveoloplasty.  ;  Surgeon: Diona Browner, DDS;  Location:  [facility];  Service: Oral Surgery;  Laterality: [name];   Family History  Problem Relation Age of Onset  . Kidney disease Mother   . Congestive Heart Failure Mother   . Hypertension Father    Social History  Substance Use Topics  . Smoking status: Current Every Day Smoker -- 0.50 packs/day for 22 years    Types: Cigarettes  . Smokeless tobacco: Never Used  . Alcohol Use: No   OB History    No data available     Review of Systems  All other systems reviewed and are negative.     Allergies  Acetaminophen  Home Medications   Prior to Admission medications   Medication Sig Start Date End Date Taking? Authorizing Provider  ALPRAZolam Duanne Moron) 1 MG tablet Take 1 mg by mouth 3 (three) times daily as needed for anxiety.    Historical Provider, MD  amoxicillin (AMOXIL) 500 MG capsule Take 1 capsule (500 mg total) by mouth 3 (three) times daily. 01/19/15   Diona Browner, DDS  amphetamine-dextroamphetamine (ADDERALL) 20 MG tablet Take 1 tablet by mouth 2 (two) times daily. 12/24/14   Historical Provider, MD  ibuprofen (ADVIL,MOTRIN) 800 MG tablet Take 1 tablet (800 mg total) by mouth every 8 (eight) hours as needed. 01/19/15   Diona Browner, DDS  insulin aspart (NOVOLOG FLEXPEN) 100 UNIT/ML injection Inject 5 Units into the skin 3 (three) times daily before meals. Patient taking differently: Inject 1-5 Units into the skin 3 (three) times  daily as needed for high blood sugar (Per sliding scale).  01/03/13   Robbie Lis, MD  oxycodone (ROXICODONE) 30 MG immediate release tablet Take 30 mg by mouth every 4 (four) hours as needed. pain    Historical Provider, MD  pregabalin (LYRICA) 150 MG capsule Take 1 capsule (150 mg total) by mouth 2 (two) times daily. 01/03/13   Robbie Lis, MD   BP 157/74 mmHg  Pulse 99  Temp(Src) 99.1 F (37.3 C) (Oral)  Resp 19  SpO2 96%  LMP 11/09/2012 Physical Exam  Constitutional: She is oriented to person, place, and time. She appears well-developed and  well-nourished. No distress.  Morbidly obese African-American female laying in bed in no acute discomfort. Patient is jovial and conversant.  HENT:  Head: Atraumatic.  Right Ear: External ear normal.  Left Ear: External ear normal.  Nose: Nose normal.  Mouth/Throat: Oropharynx is clear and moist. No oropharyngeal exudate.  Eyes: Conjunctivae are normal.  Neck: Neck supple. No JVD present.  Cardiovascular: Normal rate, regular rhythm and intact distal pulses.  Exam reveals no gallop and no friction rub.   No murmur heard. Pulmonary/Chest: Effort normal and breath sounds normal. No respiratory distress. She has no wheezes. She has no rales. She exhibits no tenderness.  Abdominal: Soft. Bowel sounds are normal. There is no tenderness.  Musculoskeletal:  Difficult to assess lower extremities due to large body habitus. However, no obvious palpable cords, erythema or appreciable edema.  Neurological: She is alert and oriented to person, place, and time.  Skin: No rash noted.  Psychiatric: She has a normal mood and affect.  Nursing note and vitals reviewed.   ED Course  Procedures (including critical care time) Labs Review Labs Reviewed - No data to display  Imaging Review Dg Chest 2 View  01/17/2016  CLINICAL DATA:  Cough with bloody phlegm for 2 days; Headache "behind eyes"; EXAM: CHEST  2 VIEW COMPARISON:  05/14/2014 FINDINGS: Mild cardiac enlargement. Vascular pattern normal. Lungs clear. Elevated right diaphragm, mild, stable. No pleural effusion. IMPRESSION: No active cardiopulmonary disease. Electronically Signed   By: Skipper Cliche M.D.   On: 01/17/2016 18:40   I have personally reviewed and evaluated these images and lab results as part of my medical decision-making.   EKG Interpretation None      MDM   Final diagnoses:  Viral respiratory infection  Cough due to ACE inhibitor    BP 184/97 mmHg  Pulse 88  Temp(Src) 99.1 F (37.3 C) (Oral)  Resp 18  SpO2 99%  LMP  11/09/2012   9:50 PM Patient presents with subjective fever, cough with blood tinge sputum and congestion. Suspect viral infection as chest x-ray shows no focal infiltrate concerning for pneumonia. Patient also recently started on lisinopril which is an ACE inhibitor this may increase risk of ACE induce cough.  Aside from her weight she does not have  any significant risk factor concerning for PE. Patient has first of GERD, the blood tinge sputum maybe due to small mallory weiss tear.  No epigastric pain on exam. She denies having any shortness of breath and she exhibits no hypoxia. We did discuss with patient options of obtaining a d-dimer to rule out PE versus watchful waiting return precaution. Patient prefers not to obtain the d-dimer due to an increased risk of false positive. She understands to return if her condition worsen. I encouraged patient to follow-up with her PCP to discuss possibly switch her blood pressure medication to decrease the  risks of ACEi induce cough. Will also provide cough medication. Return precaution discussed. Doubt TB.    Domenic Moras, PA-C 01/18/16 [id number]  Carmin Muskrat, MD 01/18/16 [phone]

## 2016-01-17 NOTE — Discharge Instructions (Signed)
Please take Tessalon as needed for your cough.  Follow up with your doctor to discuss if lisinopril may have cause your cough.  You may also have signs of a viral respiratory infection. Drink plenty of fluid and rest.  Return if you have any concerns including shortness of breath, fever, chest pain.   Cough, Adult Coughing is a reflex that clears your throat and your airways. Coughing helps to heal and protect your lungs. It is normal to cough occasionally, but a cough that happens with other symptoms or lasts a long time may be a sign of a condition that needs treatment. A cough may last only 2-3 weeks (acute), or it may last longer than 8 weeks (chronic). CAUSES Coughing is commonly caused by:  Breathing in substances that irritate your lungs.  A viral or bacterial respiratory infection.  Allergies.  Asthma.  Postnasal drip.  Smoking.  Acid backing up from the stomach into the esophagus (gastroesophageal reflux).  Certain medicines.  Chronic lung problems, including COPD (or rarely, lung cancer).  Other medical conditions such as heart failure. HOME CARE INSTRUCTIONS  Pay attention to any changes in your symptoms. Take these actions to help with your discomfort:  Take medicines only as told by your health care provider.  If you were prescribed an antibiotic medicine, take it as told by your health care provider. Do not stop taking the antibiotic even if you start to feel better.  Talk with your health care provider before you take a cough suppressant medicine.  Drink enough fluid to keep your urine clear or pale yellow.  If the air is dry, use a cold steam vaporizer or humidifier in your bedroom or your home to help loosen secretions.  Avoid anything that causes you to cough at work or at home.  If your cough is worse at night, try sleeping in a semi-upright position.  Avoid cigarette smoke. If you smoke, quit smoking. If you need help quitting, ask your health care  provider.  Avoid caffeine.  Avoid alcohol.  Rest as needed. SEEK MEDICAL CARE IF:   You have new symptoms.  You cough up pus.  Your cough does not get better after 2-3 weeks, or your cough gets worse.  You cannot control your cough with suppressant medicines and you are losing sleep.  You develop pain that is getting worse or pain that is not controlled with pain medicines.  You have a fever.  You have unexplained weight loss.  You have night sweats. SEEK IMMEDIATE MEDICAL CARE IF:  You cough up blood.  You have difficulty breathing.  Your heartbeat is very fast.   This information is not intended to replace advice given to you by your health care provider. Make sure you discuss any questions you have with your health care provider.   Document Released: 02/28/2011 Document Revised: 05/23/2015 Document Reviewed: 11/08/2014 Elsevier Interactive Patient Education [employer].

## 2016-09-17 DIAGNOSIS — M67912 Unspecified disorder of synovium and tendon, left shoulder: Secondary | ICD-10-CM | POA: Diagnosis not present

## 2016-09-17 DIAGNOSIS — M5416 Radiculopathy, lumbar region: Secondary | ICD-10-CM | POA: Diagnosis not present

## 2016-09-17 DIAGNOSIS — M47812 Spondylosis without myelopathy or radiculopathy, cervical region: Secondary | ICD-10-CM | POA: Diagnosis not present

## 2016-09-25 DIAGNOSIS — Z720 Tobacco use: Secondary | ICD-10-CM | POA: Diagnosis not present

## 2016-09-25 DIAGNOSIS — Z0000 Encounter for general adult medical examination without abnormal findings: Secondary | ICD-10-CM | POA: Diagnosis not present

## 2016-09-25 DIAGNOSIS — I10 Essential (primary) hypertension: Secondary | ICD-10-CM | POA: Diagnosis not present

## 2016-09-25 DIAGNOSIS — E1143 Type 2 diabetes mellitus with diabetic autonomic (poly)neuropathy: Secondary | ICD-10-CM | POA: Diagnosis not present

## 2016-09-25 DIAGNOSIS — E559 Vitamin D deficiency, unspecified: Secondary | ICD-10-CM | POA: Diagnosis not present

## 2016-09-25 DIAGNOSIS — G894 Chronic pain syndrome: Secondary | ICD-10-CM | POA: Diagnosis not present

## 2016-09-26 DIAGNOSIS — R600 Localized edema: Secondary | ICD-10-CM | POA: Diagnosis not present

## 2016-09-26 DIAGNOSIS — E1140 Type 2 diabetes mellitus with diabetic neuropathy, unspecified: Secondary | ICD-10-CM | POA: Insufficient documentation

## 2016-09-26 DIAGNOSIS — I10 Essential (primary) hypertension: Secondary | ICD-10-CM | POA: Insufficient documentation

## 2016-09-26 DIAGNOSIS — Z79899 Other long term (current) drug therapy: Secondary | ICD-10-CM | POA: Diagnosis not present

## 2016-09-26 DIAGNOSIS — R202 Paresthesia of skin: Secondary | ICD-10-CM | POA: Insufficient documentation

## 2016-09-26 DIAGNOSIS — Z794 Long term (current) use of insulin: Secondary | ICD-10-CM | POA: Diagnosis not present

## 2016-09-26 DIAGNOSIS — L03115 Cellulitis of right lower limb: Secondary | ICD-10-CM | POA: Insufficient documentation

## 2016-09-26 DIAGNOSIS — R05 Cough: Secondary | ICD-10-CM | POA: Diagnosis not present

## 2016-09-26 DIAGNOSIS — F17210 Nicotine dependence, cigarettes, uncomplicated: Secondary | ICD-10-CM | POA: Diagnosis not present

## 2016-09-26 DIAGNOSIS — R200 Anesthesia of skin: Secondary | ICD-10-CM | POA: Diagnosis not present

## 2016-09-26 DIAGNOSIS — R350 Frequency of micturition: Secondary | ICD-10-CM | POA: Diagnosis not present

## 2016-09-26 DIAGNOSIS — Z96653 Presence of artificial knee joint, bilateral: Secondary | ICD-10-CM | POA: Insufficient documentation

## 2016-09-27 ENCOUNTER — Encounter (HOSPITAL_COMMUNITY): Payer: Self-pay | Admitting: Family Medicine

## 2016-09-27 ENCOUNTER — Emergency Department (HOSPITAL_BASED_OUTPATIENT_CLINIC_OR_DEPARTMENT_OTHER): Payer: Medicare Other

## 2016-09-27 ENCOUNTER — Emergency Department (HOSPITAL_COMMUNITY): Payer: Medicare Other

## 2016-09-27 ENCOUNTER — Emergency Department (HOSPITAL_COMMUNITY)
Admission: EM | Admit: 2016-09-27 | Discharge: 2016-09-27 | Disposition: A | Discharge status: Home / Self Care | Payer: Medicare Other | Attending: Emergency Medicine | Admitting: Emergency Medicine

## 2016-09-27 DIAGNOSIS — R350 Frequency of micturition: Secondary | ICD-10-CM

## 2016-09-27 DIAGNOSIS — M7989 Other specified soft tissue disorders: Secondary | ICD-10-CM | POA: Diagnosis not present

## 2016-09-27 DIAGNOSIS — R05 Cough: Secondary | ICD-10-CM | POA: Diagnosis not present

## 2016-09-27 DIAGNOSIS — R600 Localized edema: Secondary | ICD-10-CM

## 2016-09-27 DIAGNOSIS — R200 Anesthesia of skin: Secondary | ICD-10-CM

## 2016-09-27 DIAGNOSIS — R202 Paresthesia of skin: Secondary | ICD-10-CM

## 2016-09-27 DIAGNOSIS — L03115 Cellulitis of right lower limb: Secondary | ICD-10-CM

## 2016-09-27 LAB — URINALYSIS, ROUTINE W REFLEX MICROSCOPIC
Bacteria, UA: NONE SEEN
Bilirubin Urine: NEGATIVE
Glucose, UA: NEGATIVE mg/dL
KETONES UR: NEGATIVE mg/dL
LEUKOCYTES UA: NEGATIVE
Nitrite: NEGATIVE
Protein, ur: NEGATIVE mg/dL
SQUAMOUS EPITHELIAL / LPF: NONE SEEN
Specific Gravity, Urine: 1.008 (ref 1.005–1.030)
pH: 7 (ref 5.0–8.0)

## 2016-09-27 LAB — I-STAT CHEM 8, ED
BUN: 28 mg/dL — ABNORMAL HIGH (ref 6–20)
CREATININE: 1.2 mg/dL — AB (ref 0.44–1.00)
Calcium, Ion: 1.15 mmol/L (ref 1.15–1.40)
Chloride: 100 mmol/L — ABNORMAL LOW (ref 101–111)
Glucose, Bld: 99 mg/dL (ref 65–99)
HEMATOCRIT: 34 % — AB (ref 36.0–46.0)
HEMOGLOBIN: 11.6 g/dL — AB (ref 12.0–15.0)
POTASSIUM: 4.6 mmol/L (ref 3.5–5.1)
Sodium: 138 mmol/L (ref 135–145)
TCO2: 29 mmol/L (ref 0–100)

## 2016-09-27 MED ORDER — DOXYCYCLINE HYCLATE 100 MG PO CAPS: 100.0000 mg | ORAL_CAPSULE | Freq: Two times a day (BID) | ORAL | 0 refills | Status: DC

## 2016-09-27 MED ORDER — CEFTRIAXONE SODIUM 1 G IJ SOLR: 1.0000 g | Freq: Once | INTRAMUSCULAR | Status: DC

## 2016-09-27 MED ORDER — DOXYCYCLINE HYCLATE 100 MG PO TABS: 100.0000 mg | ORAL_TABLET | Freq: Once | ORAL | Status: DC

## 2016-09-27 MED ORDER — LIDOCAINE HCL 1 % IJ SOLN
INTRAMUSCULAR | Status: AC
  Administered 2016-09-27: 2.2 mL
  Filled 2016-09-27: qty 20

## 2016-09-27 MED ORDER — CEFTRIAXONE SODIUM 1 G IJ SOLR
1.0000 g | Freq: Once | INTRAMUSCULAR | Status: AC
  Administered 2016-09-27: 1 g via INTRAMUSCULAR
  Filled 2016-09-27: qty 10

## 2016-09-27 MED ORDER — CEPHALEXIN 500 MG PO CAPS: 500.0000 mg | ORAL_CAPSULE | Freq: Three times a day (TID) | ORAL | 0 refills | Status: DC

## 2016-09-27 NOTE — ED Notes (Signed)
Pt. In xray 

## 2016-09-27 NOTE — ED Provider Notes (Signed)
[facility] DEPT Provider Note   CSN: [id number] Arrival date & time: 09/26/16  2311     History   Chief Complaint Chief Complaint  Patient presents with  . Numbness    HPI [name] is a 51 y.o. female.  Patient with obesity, diabetes, high blood pressure presents with multiple different symptoms. Asian said intermittent numbness in the right leg and left arm for the past week. No stroke history. No focal weakness. Patient is also had urinary frequency the past 2 days. Furthermore patients notice the past 2 days leg edema in both legs. No blood clot history, no recent surgery no active cancer history. Patient denies fevers or chills.   The history is provided by the patient.    Past Medical History:  Diagnosis Date  . Arthritis   . Diabetes mellitus   . Diabetic neuropathy ([place])   . Family history of adverse reaction to anesthesia    Mother is hard to arouse and blood pressure drops  . GERD (gastroesophageal reflux disease)   . Hypertension   . Sleep apnea     Patient Active Problem List   Diagnosis Date Noted  . Lower extremity cellulitis 01/03/2013  . Diabetes mellitus ([place]) 01/03/2013  . OBESITY, NOS 11/12/2006  . TOBACCO DEPENDENCE 11/12/2006  . DEPRESSIVE DISORDER, NOS 11/12/2006  . HYPERTENSION, BENIGN SYSTEMIC 11/12/2006  . GASTROESOPHAGEAL REFLUX, NO ESOPHAGITIS 11/12/2006  . ACNE 11/12/2006  . OSTEOARTHRITIS, LOWER LEG 11/12/2006  . APNEA, SLEEP 11/12/2006  . CHEST PAIN 11/12/2006    Past Surgical History:  Procedure Laterality Date  . carpal tunnel repair    . CHOLECYSTECTOMY  1999  . JOINT REPLACEMENT     bllateral total knees  . MULTIPLE EXTRACTIONS WITH ALVEOLOPLASTY [name] 01/19/2015   Procedure: MULTIPLE EXTRACTIONS Three, Four, Five, Seven, eight, nine, ten, eleven, twelve, fourteen, eighteen, twenty-one, twenty-eight, twenty-nine, thirty-one with Alveoloplasty.  ;  Surgeon: Diona Browner, DDS;  Location: [facility];  Service: Oral Surgery;   Laterality: [name];  . REPLACEMENT TOTAL KNEE BILATERAL    . spinal chord repair  2011  . Toenails removed from bilateral great toes    . TONSILLECTOMY      OB History    No data available       Home Medications    Prior to Admission medications   Medication Sig Start Date End Date Taking? Authorizing Provider  ibuprofen (ADVIL,MOTRIN) 200 MG tablet Take 400 mg by mouth every 6 (six) hours as needed for headache.   Yes Historical Provider, MD  insulin glargine (LANTUS) 100 UNIT/ML injection Inject 60 Units into the skin 2 (two) times daily.   Yes Historical Provider, MD  lisinopril (PRINIVIL,ZESTRIL) 20 MG tablet Take 1 tablet by mouth daily. 09/25/16  Yes Historical Provider, MD  oxycodone (ROXICODONE) 30 MG immediate release tablet Take 30 mg by mouth every 4 (four) hours as needed for pain. pain    Yes Historical Provider, MD  pregabalin (LYRICA) 150 MG capsule Take 1 capsule (150 mg total) by mouth 2 (two) times daily. 01/03/13  Yes Robbie Lis, MD  doxycycline (VIBRAMYCIN) 100 MG capsule Take 1 capsule (100 mg total) by mouth 2 (two) times daily. 09/27/16   Elnora Morrison, MD    Family History Family History  Problem Relation Age of Onset  . Kidney disease Mother   . Congestive Heart Failure Mother   . Hypertension Father     Social History Social History  Substance Use Topics  . Smoking status: Current  Every Day Smoker    Packs/day: 0.50    Years: 22.00    Types: Cigarettes  . Smokeless tobacco: Never Used  . Alcohol use No     Allergies   Acetaminophen   Review of Systems Review of Systems  Constitutional: Negative for chills and fever.  HENT: Negative for congestion.   Eyes: Negative for visual disturbance.  Respiratory: Negative for shortness of breath.   Cardiovascular: Positive for leg swelling. Negative for chest pain.  Gastrointestinal: Negative for abdominal pain and vomiting.  Genitourinary: Positive for frequency. Negative for dysuria and flank  pain.  Musculoskeletal: Negative for back pain, neck pain and neck stiffness.  Skin: Negative for rash.  Neurological: Positive for numbness. Negative for weakness, light-headedness and headaches.     Physical Exam Updated Vital Signs BP 130/96 (BP Location: Left Arm)   Pulse 96   Temp 99.3 F (37.4 C) (Oral)   Resp 18   Ht 5\' 4"  (1.626 m)   Wt (!) 366 lb (166 kg)   LMP 11/09/2012   SpO2 94%   BMI 62.82 kg/m   Physical Exam  Constitutional: She appears well-developed and well-nourished. No distress.  HENT:  Head: Normocephalic and atraumatic.  Eyes: Conjunctivae are normal.  Neck: Neck supple.  Cardiovascular: Normal rate and regular rhythm.   No murmur heard. Pulmonary/Chest: Effort normal and breath sounds normal. No respiratory distress.  Abdominal: Soft. There is no tenderness.  Musculoskeletal: She exhibits edema (1+ bilateral lower extremity no calf pain).  Neurological: She is alert. No cranial nerve deficit or sensory deficit. GCS eye subscore is 4. GCS verbal subscore is 5. GCS motor subscore is 6.  Finger nose equal bilateral. No facial droop. No arm drift.  Skin: Skin is warm and dry.  Has warmth erythema right lateral lower extremity without fluctuance.  Psychiatric: She has a normal mood and affect.  Nursing note and vitals reviewed.    ED Treatments / Results  Labs (all labs ordered are listed, but only abnormal results are displayed) Labs Reviewed  I-STAT CHEM 8, ED - Abnormal; Notable for the following:       Result Value   Chloride 100 (*)    BUN 28 (*)    Creatinine, Ser 1.20 (*)    Hemoglobin 11.6 (*)    HCT 34.0 (*)    All other components within normal limits  URINALYSIS, ROUTINE W REFLEX MICROSCOPIC    EKG  EKG Interpretation None       Radiology No results found.  Procedures Procedures (including critical care time)  Medications Ordered in ED Medications  cefTRIAXone (ROCEPHIN) injection 1 g (not administered)      Initial Impression / Assessment and Plan / ED Course  I have reviewed the triage vital signs and the nursing notes.  Pertinent labs & imaging results that were available during my care of the patient were reviewed by me and considered in my medical decision making (see chart for details).  Clinical Course    Patient presents with multiple different symptoms. Discussed primary reason for visit she stated leg edema which is new for her. Plan to check kidney function and bilateral ultrasound lower extremity specimen with body habitus and more challenging exam. Patient willing to wait the morning when ultrasound technician arise. Patient well-appearing otherwise. Numbness nonspecific no focal deficits, likely cellulitis related Patient has bilateral symptoms very low concern for stroke or emergent process. Discussed continued outpatient follow-up if it does not resolve or worsens with weakness. Abx  and signed out to fup duplex with likely close outpt fup.      Final Clinical Impressions(s) / ED Diagnoses   Final diagnoses:  Bilateral leg edema  Numbness and tingling of right leg  Urinary frequency  Cellulitis of right leg    New Prescriptions New Prescriptions   DOXYCYCLINE (VIBRAMYCIN) 100 MG CAPSULE    Take 1 capsule (100 mg total) by mouth 2 (two) times daily.     Elnora Morrison, MD 09/27/16 0700

## 2016-09-27 NOTE — ED Triage Notes (Signed)
Patient reports she is experiencing left arm and right leg numbness started 2 days ago. Also, increase bilateral lower extremity swelling .

## 2016-09-27 NOTE — Progress Notes (Signed)
**  Preliminary report by tech**  Bilateral lower extremity venous duplex completed. There is no obvious evidence of deep or superficial vein thrombosis involving the right and left lower extremities. All clearly visualized vessels appear patent and compressible. There is no evidence of Baker's cysts bilaterally. Results given to Dr. Jeneen Rinks.  09/27/16 12:08 PM Carlos Levering RVT

## 2016-09-27 NOTE — ED Notes (Signed)
Patient is A & O x4.  She understood discharge instructions. 

## 2016-09-27 NOTE — ED Notes (Signed)
Per [facility], they have several studies at [facility] to complete, will be here at [facility].

## 2016-09-27 NOTE — Discharge Instructions (Signed)
If you were given medicines take as directed.  If you are on coumadin or contraceptives realize their levels and effectiveness is altered by many different medicines.  If you have any reaction (rash, tongues swelling, other) to the medicines stop taking and see a physician.    If your blood pressure was elevated in the ER make sure you follow up for management with a primary doctor or return for chest pain, shortness of breath or stroke symptoms.  Please follow up as directed and return to the ER or see a physician for new or worsening symptoms.  Thank you. Vitals:   09/27/16 0021 09/27/16 0023 09/27/16 0512 09/27/16 0512  BP: 132/77   130/96  Pulse: 95   96  Resp: 20   18  Temp: 97.7 F (36.5 C)   99.3 F (37.4 C)  TempSrc: Oral   Oral  SpO2: 100%  96% 94%  Weight:  (!) 366 lb (166 kg)    Height:  5\' 4"  (1.626 m)

## 2016-09-27 NOTE — ED Notes (Signed)
Pt is c/o left sided upper extremity numbness, with right leg numbness, pt is also c/o of right leg pain that appears swollen and red.

## 2016-09-27 NOTE — ED Notes (Signed)
As I write this; vascular tech. Is performing his test.

## 2016-09-27 NOTE — ED Notes (Signed)
She is in no distress; and I obtain a sandwich and drink for her per request.

## 2016-09-30 DIAGNOSIS — M48062 Spinal stenosis, lumbar region with neurogenic claudication: Secondary | ICD-10-CM | POA: Diagnosis not present

## 2016-09-30 DIAGNOSIS — M519 Unspecified thoracic, thoracolumbar and lumbosacral intervertebral disc disorder: Secondary | ICD-10-CM | POA: Diagnosis not present

## 2016-09-30 DIAGNOSIS — Q762 Congenital spondylolisthesis: Secondary | ICD-10-CM | POA: Diagnosis not present

## 2016-10-23 DIAGNOSIS — H538 Other visual disturbances: Secondary | ICD-10-CM | POA: Diagnosis not present

## 2016-10-23 DIAGNOSIS — E875 Hyperkalemia: Secondary | ICD-10-CM | POA: Diagnosis not present

## 2016-10-23 DIAGNOSIS — E1143 Type 2 diabetes mellitus with diabetic autonomic (poly)neuropathy: Secondary | ICD-10-CM | POA: Diagnosis not present

## 2016-10-23 DIAGNOSIS — Z720 Tobacco use: Secondary | ICD-10-CM | POA: Diagnosis not present

## 2016-10-23 DIAGNOSIS — Z0000 Encounter for general adult medical examination without abnormal findings: Secondary | ICD-10-CM | POA: Diagnosis not present

## 2016-10-23 DIAGNOSIS — Z5181 Encounter for therapeutic drug level monitoring: Secondary | ICD-10-CM | POA: Diagnosis not present

## 2016-10-23 DIAGNOSIS — Z01118 Encounter for examination of ears and hearing with other abnormal findings: Secondary | ICD-10-CM | POA: Diagnosis not present

## 2016-12-10 DIAGNOSIS — G894 Chronic pain syndrome: Secondary | ICD-10-CM | POA: Diagnosis not present

## 2016-12-10 DIAGNOSIS — E1143 Type 2 diabetes mellitus with diabetic autonomic (poly)neuropathy: Secondary | ICD-10-CM | POA: Diagnosis not present

## 2016-12-10 DIAGNOSIS — E559 Vitamin D deficiency, unspecified: Secondary | ICD-10-CM | POA: Diagnosis not present

## 2016-12-10 DIAGNOSIS — Z720 Tobacco use: Secondary | ICD-10-CM | POA: Diagnosis not present

## 2016-12-10 DIAGNOSIS — I10 Essential (primary) hypertension: Secondary | ICD-10-CM | POA: Diagnosis not present

## 2016-12-16 DIAGNOSIS — I10 Essential (primary) hypertension: Secondary | ICD-10-CM | POA: Diagnosis not present

## 2016-12-16 DIAGNOSIS — Z5181 Encounter for therapeutic drug level monitoring: Secondary | ICD-10-CM | POA: Diagnosis not present

## 2016-12-16 DIAGNOSIS — M5416 Radiculopathy, lumbar region: Secondary | ICD-10-CM | POA: Diagnosis not present

## 2016-12-16 DIAGNOSIS — Z79899 Other long term (current) drug therapy: Secondary | ICD-10-CM | POA: Diagnosis not present

## 2016-12-16 DIAGNOSIS — M47812 Spondylosis without myelopathy or radiculopathy, cervical region: Secondary | ICD-10-CM | POA: Diagnosis not present

## 2016-12-16 DIAGNOSIS — M67912 Unspecified disorder of synovium and tendon, left shoulder: Secondary | ICD-10-CM | POA: Diagnosis not present

## 2016-12-16 DIAGNOSIS — Z79891 Long term (current) use of opiate analgesic: Secondary | ICD-10-CM | POA: Diagnosis not present

## 2016-12-18 DIAGNOSIS — Q762 Congenital spondylolisthesis: Secondary | ICD-10-CM | POA: Diagnosis not present

## 2016-12-18 DIAGNOSIS — M48061 Spinal stenosis, lumbar region without neurogenic claudication: Secondary | ICD-10-CM | POA: Diagnosis not present

## 2016-12-18 DIAGNOSIS — M519 Unspecified thoracic, thoracolumbar and lumbosacral intervertebral disc disorder: Secondary | ICD-10-CM | POA: Diagnosis not present

## 2017-01-20 DIAGNOSIS — Q762 Congenital spondylolisthesis: Secondary | ICD-10-CM | POA: Diagnosis not present

## 2017-01-20 DIAGNOSIS — G802 Spastic hemiplegic cerebral palsy: Secondary | ICD-10-CM | POA: Diagnosis not present

## 2017-01-20 DIAGNOSIS — M1710 Unilateral primary osteoarthritis, unspecified knee: Secondary | ICD-10-CM | POA: Diagnosis not present

## 2017-01-22 DIAGNOSIS — M48061 Spinal stenosis, lumbar region without neurogenic claudication: Secondary | ICD-10-CM | POA: Diagnosis not present

## 2017-01-22 DIAGNOSIS — M519 Unspecified thoracic, thoracolumbar and lumbosacral intervertebral disc disorder: Secondary | ICD-10-CM | POA: Diagnosis not present

## 2017-01-22 DIAGNOSIS — Q762 Congenital spondylolisthesis: Secondary | ICD-10-CM | POA: Diagnosis not present

## 2017-02-23 DIAGNOSIS — Q762 Congenital spondylolisthesis: Secondary | ICD-10-CM | POA: Diagnosis not present

## 2017-02-23 DIAGNOSIS — M48061 Spinal stenosis, lumbar region without neurogenic claudication: Secondary | ICD-10-CM | POA: Diagnosis not present

## 2017-02-23 DIAGNOSIS — M519 Unspecified thoracic, thoracolumbar and lumbosacral intervertebral disc disorder: Secondary | ICD-10-CM | POA: Diagnosis not present

## 2017-04-14 DIAGNOSIS — Q762 Congenital spondylolisthesis: Secondary | ICD-10-CM | POA: Diagnosis not present

## 2017-04-14 DIAGNOSIS — M48061 Spinal stenosis, lumbar region without neurogenic claudication: Secondary | ICD-10-CM | POA: Diagnosis not present

## 2017-04-14 DIAGNOSIS — M519 Unspecified thoracic, thoracolumbar and lumbosacral intervertebral disc disorder: Secondary | ICD-10-CM | POA: Diagnosis not present

## 2017-05-15 DIAGNOSIS — M48061 Spinal stenosis, lumbar region without neurogenic claudication: Secondary | ICD-10-CM | POA: Diagnosis not present

## 2017-05-15 DIAGNOSIS — Q762 Congenital spondylolisthesis: Secondary | ICD-10-CM | POA: Diagnosis not present

## 2017-05-15 DIAGNOSIS — M519 Unspecified thoracic, thoracolumbar and lumbosacral intervertebral disc disorder: Secondary | ICD-10-CM | POA: Diagnosis not present

## 2017-06-15 DIAGNOSIS — Q762 Congenital spondylolisthesis: Secondary | ICD-10-CM | POA: Diagnosis not present

## 2017-06-15 DIAGNOSIS — M519 Unspecified thoracic, thoracolumbar and lumbosacral intervertebral disc disorder: Secondary | ICD-10-CM | POA: Diagnosis not present

## 2017-06-15 DIAGNOSIS — M48061 Spinal stenosis, lumbar region without neurogenic claudication: Secondary | ICD-10-CM | POA: Diagnosis not present

## 2017-07-17 DIAGNOSIS — Q762 Congenital spondylolisthesis: Secondary | ICD-10-CM | POA: Diagnosis not present

## 2017-07-17 DIAGNOSIS — M48061 Spinal stenosis, lumbar region without neurogenic claudication: Secondary | ICD-10-CM | POA: Diagnosis not present

## 2017-07-17 DIAGNOSIS — M519 Unspecified thoracic, thoracolumbar and lumbosacral intervertebral disc disorder: Secondary | ICD-10-CM | POA: Diagnosis not present

## 2017-08-17 DIAGNOSIS — Z720 Tobacco use: Secondary | ICD-10-CM | POA: Diagnosis not present

## 2017-08-17 DIAGNOSIS — E559 Vitamin D deficiency, unspecified: Secondary | ICD-10-CM | POA: Diagnosis not present

## 2017-08-17 DIAGNOSIS — I10 Essential (primary) hypertension: Secondary | ICD-10-CM | POA: Diagnosis not present

## 2017-08-17 DIAGNOSIS — E1143 Type 2 diabetes mellitus with diabetic autonomic (poly)neuropathy: Secondary | ICD-10-CM | POA: Diagnosis not present

## 2017-08-17 DIAGNOSIS — M519 Unspecified thoracic, thoracolumbar and lumbosacral intervertebral disc disorder: Secondary | ICD-10-CM | POA: Diagnosis not present

## 2017-08-17 DIAGNOSIS — Q762 Congenital spondylolisthesis: Secondary | ICD-10-CM | POA: Diagnosis not present

## 2017-08-17 DIAGNOSIS — M48061 Spinal stenosis, lumbar region without neurogenic claudication: Secondary | ICD-10-CM | POA: Diagnosis not present

## 2017-08-17 DIAGNOSIS — G894 Chronic pain syndrome: Secondary | ICD-10-CM | POA: Diagnosis not present

## 2017-08-31 ENCOUNTER — Encounter (INDEPENDENT_AMBULATORY_CARE_PROVIDER_SITE_OTHER): Payer: Self-pay | Admitting: Orthopedic Surgery

## 2017-08-31 ENCOUNTER — Ambulatory Visit (INDEPENDENT_AMBULATORY_CARE_PROVIDER_SITE_OTHER): Payer: Medicare Other | Admitting: Orthopedic Surgery

## 2017-08-31 ENCOUNTER — Ambulatory Visit (INDEPENDENT_AMBULATORY_CARE_PROVIDER_SITE_OTHER): Payer: Medicare Other

## 2017-08-31 VITALS — Ht 64.0 in | Wt 366.0 lb

## 2017-08-31 DIAGNOSIS — G8929 Other chronic pain: Secondary | ICD-10-CM

## 2017-08-31 DIAGNOSIS — M25561 Pain in right knee: Secondary | ICD-10-CM

## 2017-08-31 DIAGNOSIS — Z96659 Presence of unspecified artificial knee joint: Secondary | ICD-10-CM

## 2017-08-31 DIAGNOSIS — T84018S Broken internal joint prosthesis, other site, sequela: Secondary | ICD-10-CM

## 2017-08-31 NOTE — Progress Notes (Signed)
Office Visit Note   Patient: [name]           Date of Birth: 09-03-1966           MRN: [id number] Visit Date: 08/31/2017              Requested by: No referring provider defined for this encounter. PCP: Benito Mccreedy, MD  Chief Complaint  Patient presents with  . Right Knee - Pain      HPI: Patient presents 9 years status post right total knee arthroplasty.  Patient states that she has had multiple falls has increasing varus alignment of her knee.  Patient states that she is gained weight since her last surgery.  Assessment & Plan: Visit Diagnoses:  1. Chronic pain of right knee   2. Failed total knee arthroplasty, sequela     Plan: Discussed with the patient that there are no braces that can safely hold her knee.  Discussed the critical nature of needing to lose weight exercise and get her body into a condition that it would be safe for surgery.  Patient states she has a critical meeting early January in [place] and that she will call us once this issue has been resolved.  Patient may need a hinged knee may only need revision tibia with a long-stem.  Follow-Up Instructions: Return in about 4 weeks (around 09/28/2017).   Ortho Exam  Patient is alert, oriented, no adenopathy, well-dressed, normal affect, normal respiratory effort. Examination patient is nonambulatory.  She is in a wheelchair.  She has severe varus alignment with an unstable right knee.  Patient body weight reported gives her a BMI greater than 60.  I feel this is definitely under reporting her BMI.  There is no redness no cellulitis no signs of infection.  Imaging: Xr Knee 1-2 Views Right  Result Date: 08/31/2017 3 view radiographs of the right knee shows complete collapse of the tibial component with the knee in varus with subsidence of the entire tibial tray within the tibial canal.  Lateral radiograph shows calcification along the patella tendon with collapse of the tibial component within  the tibial canal.  No images are attached to the encounter.  Labs: Lab Results  Component Value Date   HGBA1C 11.3 (H) 01/03/2013   HGBA1C 8.6 (H) 03/05/2011   HGBA1C (H) 03/03/2010    9.8 (NOTE)                                                                       According to the ADA Clinical Practice Recommendations for 2011, when HbA1c is used as a screening test:   >=6.5%   Diagnostic of Diabetes Mellitus           (if abnormal result  is confirmed)  5.7-6.4%   Increased risk of developing Diabetes Mellitus  References:Diagnosis and Classification of Diabetes Mellitus,Diabetes UYQI,3474,25(ZDGLO 1):S62-S69 and Standards of Medical Care in         Diabetes - 2011,Diabetes Care,2011,34  (Suppl 1):S11-S61.   REPTSTATUS 04/16/2011 FINAL 04/10/2011   CULT NO GROWTH 5 DAYS 04/10/2011    @LABSALLVALUES (HGBA1)@  Body mass index is 62.82 kg/m.  Orders:  Orders Placed This Encounter  Procedures  . XR Knee 1-2 Views  Right   No orders of the defined types were placed in this encounter.    Procedures: No procedures performed  Clinical Data: No additional findings.  ROS:  All other systems negative, except as noted in the HPI. Review of Systems  Objective: Vital Signs: Ht 5\' 4"  (1.626 m)   Wt (!) 366 lb (166 kg)   LMP 11/09/2012   BMI 62.82 kg/m   Specialty Comments:  No specialty comments available.  PMFS History: Patient Active Problem List   Diagnosis Date Noted  . Failed total knee arthroplasty, sequela 08/31/2017  . Lower extremity cellulitis 01/03/2013  . Diabetes mellitus ([place]) 01/03/2013  . OBESITY, NOS 11/12/2006  . TOBACCO DEPENDENCE 11/12/2006  . DEPRESSIVE DISORDER, NOS 11/12/2006  . HYPERTENSION, BENIGN SYSTEMIC 11/12/2006  . GASTROESOPHAGEAL REFLUX, NO ESOPHAGITIS 11/12/2006  . ACNE 11/12/2006  . OSTEOARTHRITIS, LOWER LEG 11/12/2006  . APNEA, SLEEP 11/12/2006  . CHEST PAIN 11/12/2006   Past Medical History:  Diagnosis Date  . Arthritis   .  Diabetes mellitus   . Diabetic neuropathy ([place])   . Family history of adverse reaction to anesthesia    Mother is hard to arouse and blood pressure drops  . GERD (gastroesophageal reflux disease)   . Hypertension   . Sleep apnea     Family History  Problem Relation Age of Onset  . Kidney disease Mother   . Congestive Heart Failure Mother   . Hypertension Father     Past Surgical History:  Procedure Laterality Date  . carpal tunnel repair    . CHOLECYSTECTOMY  1999  . JOINT REPLACEMENT     bllateral total knees  . MULTIPLE EXTRACTIONS WITH ALVEOLOPLASTY [name] 01/19/2015   Procedure: MULTIPLE EXTRACTIONS Three, Four, Five, Seven, eight, nine, ten, eleven, twelve, fourteen, eighteen, twenty-one, twenty-eight, twenty-nine, thirty-one with Alveoloplasty.  ;  Surgeon: Diona Browner, DDS;  Location: [facility];  Service: Oral Surgery;  Laterality: [name];  . REPLACEMENT TOTAL KNEE BILATERAL    . spinal chord repair  2011  . Toenails removed from bilateral great toes    . TONSILLECTOMY     Social History   Occupational History  . Not on file  Tobacco Use  . Smoking status: Current Every Day Smoker    Packs/day: 0.50    Years: 22.00    Pack years: 11.00    Types: Cigarettes  . Smokeless tobacco: Never Used  Substance and Sexual Activity  . Alcohol use: No  . Drug use: No  . Sexual activity: No

## 2017-09-01 DIAGNOSIS — G802 Spastic hemiplegic cerebral palsy: Secondary | ICD-10-CM | POA: Diagnosis not present

## 2017-09-11 ENCOUNTER — Telehealth (INDEPENDENT_AMBULATORY_CARE_PROVIDER_SITE_OTHER): Payer: Self-pay | Admitting: Orthopedic Surgery

## 2017-09-11 NOTE — Telephone Encounter (Signed)
Pt called and would like to know if she can get paperwork pertaining to her left knee and the surgery that will be provided soon.   [facility] (Note)  [facility] (Note)   Please mail info to much pt

## 2017-09-11 NOTE — Telephone Encounter (Signed)
I was going to try and get letters ready, but blue sheet has not been filled out in regards to surgery. Last office note states patient is going to call us back after critical appt in January.

## 2017-09-16 NOTE — Telephone Encounter (Signed)
I called and left voicemail for patient advising she please call us back with a little more clarity in to exactly what she is meaning by paperwork. Is she requesting a letter stating she will need surgical intervention, is she needing her last office notes?? I'm not really sure what she means. And advised in her last office note she was having a critical appointment this January and was going to call us about her surgery.

## 2017-09-25 ENCOUNTER — Telehealth (INDEPENDENT_AMBULATORY_CARE_PROVIDER_SITE_OTHER): Payer: Self-pay | Admitting: Orthopedic Surgery

## 2017-09-25 NOTE — Telephone Encounter (Signed)
Ms. Depp left multiple messages on my voicemail stating that she is supposed to be scheduled for surgery.  I looked over your last note, and you did advise the patient that she needed to improve her health, lose weight, etc prior to surgery.  Is it ok to go ahead and schedule?  If so, can you please fill out a surgery sheet? Thank you!

## 2017-09-28 NOTE — Telephone Encounter (Signed)
Lets get medical clearance

## 2017-09-29 NOTE — Telephone Encounter (Signed)
Debbie, please call Ms. Engelken and explain that we need medical clearance before we can proceed with surgery. Thank you!

## 2017-10-02 DIAGNOSIS — G802 Spastic hemiplegic cerebral palsy: Secondary | ICD-10-CM | POA: Diagnosis not present

## 2017-10-09 DIAGNOSIS — Z720 Tobacco use: Secondary | ICD-10-CM | POA: Diagnosis not present

## 2017-10-09 DIAGNOSIS — E559 Vitamin D deficiency, unspecified: Secondary | ICD-10-CM | POA: Diagnosis not present

## 2017-10-09 DIAGNOSIS — E1143 Type 2 diabetes mellitus with diabetic autonomic (poly)neuropathy: Secondary | ICD-10-CM | POA: Diagnosis not present

## 2017-10-09 DIAGNOSIS — Z01818 Encounter for other preprocedural examination: Secondary | ICD-10-CM | POA: Diagnosis not present

## 2017-10-09 DIAGNOSIS — I10 Essential (primary) hypertension: Secondary | ICD-10-CM | POA: Diagnosis not present

## 2017-10-23 DIAGNOSIS — E1143 Type 2 diabetes mellitus with diabetic autonomic (poly)neuropathy: Secondary | ICD-10-CM | POA: Diagnosis not present

## 2017-10-23 DIAGNOSIS — I10 Essential (primary) hypertension: Secondary | ICD-10-CM | POA: Diagnosis not present

## 2017-10-23 DIAGNOSIS — Z0000 Encounter for general adult medical examination without abnormal findings: Secondary | ICD-10-CM | POA: Diagnosis not present

## 2017-10-23 DIAGNOSIS — E559 Vitamin D deficiency, unspecified: Secondary | ICD-10-CM | POA: Diagnosis not present

## 2017-11-02 DIAGNOSIS — G802 Spastic hemiplegic cerebral palsy: Secondary | ICD-10-CM | POA: Diagnosis not present

## 2017-11-06 DIAGNOSIS — R9431 Abnormal electrocardiogram [ECG] [EKG]: Secondary | ICD-10-CM | POA: Diagnosis not present

## 2017-11-06 DIAGNOSIS — I10 Essential (primary) hypertension: Secondary | ICD-10-CM | POA: Diagnosis not present

## 2017-11-10 ENCOUNTER — Encounter (HOSPITAL_COMMUNITY): Payer: Self-pay | Admitting: Emergency Medicine

## 2017-11-10 ENCOUNTER — Other Ambulatory Visit: Payer: Self-pay

## 2017-11-10 ENCOUNTER — Emergency Department (HOSPITAL_COMMUNITY): Payer: Medicare Other

## 2017-11-10 ENCOUNTER — Inpatient Hospital Stay (HOSPITAL_COMMUNITY)
Admission: EM | Admit: 2017-11-10 | Discharge: 2017-11-13 | DRG: 291 | Disposition: A | Discharge status: Home Health | Payer: Medicare Other | Attending: Family Medicine | Admitting: Family Medicine

## 2017-11-10 DIAGNOSIS — R0602 Shortness of breath: Secondary | ICD-10-CM | POA: Diagnosis not present

## 2017-11-10 DIAGNOSIS — Z8249 Family history of ischemic heart disease and other diseases of the circulatory system: Secondary | ICD-10-CM

## 2017-11-10 DIAGNOSIS — N2889 Other specified disorders of kidney and ureter: Secondary | ICD-10-CM | POA: Diagnosis present

## 2017-11-10 DIAGNOSIS — I509 Heart failure, unspecified: Secondary | ICD-10-CM | POA: Diagnosis not present

## 2017-11-10 DIAGNOSIS — I5031 Acute diastolic (congestive) heart failure: Secondary | ICD-10-CM | POA: Diagnosis present

## 2017-11-10 DIAGNOSIS — I130 Hypertensive heart and chronic kidney disease with heart failure and stage 1 through stage 4 chronic kidney disease, or unspecified chronic kidney disease: Secondary | ICD-10-CM | POA: Diagnosis not present

## 2017-11-10 DIAGNOSIS — R21 Rash and other nonspecific skin eruption: Secondary | ICD-10-CM | POA: Diagnosis not present

## 2017-11-10 DIAGNOSIS — Z79899 Other long term (current) drug therapy: Secondary | ICD-10-CM

## 2017-11-10 DIAGNOSIS — G4730 Sleep apnea, unspecified: Secondary | ICD-10-CM | POA: Diagnosis not present

## 2017-11-10 DIAGNOSIS — K219 Gastro-esophageal reflux disease without esophagitis: Secondary | ICD-10-CM | POA: Diagnosis present

## 2017-11-10 DIAGNOSIS — Z9049 Acquired absence of other specified parts of digestive tract: Secondary | ICD-10-CM | POA: Diagnosis not present

## 2017-11-10 DIAGNOSIS — N179 Acute kidney failure, unspecified: Secondary | ICD-10-CM | POA: Diagnosis present

## 2017-11-10 DIAGNOSIS — E118 Type 2 diabetes mellitus with unspecified complications: Secondary | ICD-10-CM

## 2017-11-10 DIAGNOSIS — R079 Chest pain, unspecified: Secondary | ICD-10-CM | POA: Diagnosis not present

## 2017-11-10 DIAGNOSIS — I10 Essential (primary) hypertension: Secondary | ICD-10-CM | POA: Diagnosis not present

## 2017-11-10 DIAGNOSIS — Z96653 Presence of artificial knee joint, bilateral: Secondary | ICD-10-CM | POA: Diagnosis present

## 2017-11-10 DIAGNOSIS — Z794 Long term (current) use of insulin: Secondary | ICD-10-CM

## 2017-11-10 DIAGNOSIS — I110 Hypertensive heart disease with heart failure: Secondary | ICD-10-CM | POA: Diagnosis not present

## 2017-11-10 DIAGNOSIS — Z888 Allergy status to other drugs, medicaments and biological substances status: Secondary | ICD-10-CM | POA: Diagnosis not present

## 2017-11-10 DIAGNOSIS — Z841 Family history of disorders of kidney and ureter: Secondary | ICD-10-CM | POA: Diagnosis not present

## 2017-11-10 DIAGNOSIS — E669 Obesity, unspecified: Secondary | ICD-10-CM | POA: Diagnosis present

## 2017-11-10 DIAGNOSIS — E1122 Type 2 diabetes mellitus with diabetic chronic kidney disease: Secondary | ICD-10-CM | POA: Diagnosis present

## 2017-11-10 DIAGNOSIS — R000 Tachycardia, unspecified: Secondary | ICD-10-CM | POA: Diagnosis not present

## 2017-11-10 DIAGNOSIS — N182 Chronic kidney disease, stage 2 (mild): Secondary | ICD-10-CM | POA: Diagnosis not present

## 2017-11-10 DIAGNOSIS — G8929 Other chronic pain: Secondary | ICD-10-CM | POA: Diagnosis not present

## 2017-11-10 DIAGNOSIS — M1990 Unspecified osteoarthritis, unspecified site: Secondary | ICD-10-CM | POA: Diagnosis not present

## 2017-11-10 DIAGNOSIS — E1140 Type 2 diabetes mellitus with diabetic neuropathy, unspecified: Secondary | ICD-10-CM | POA: Diagnosis present

## 2017-11-10 DIAGNOSIS — R600 Localized edema: Secondary | ICD-10-CM | POA: Diagnosis not present

## 2017-11-10 DIAGNOSIS — F17210 Nicotine dependence, cigarettes, uncomplicated: Secondary | ICD-10-CM | POA: Diagnosis present

## 2017-11-10 DIAGNOSIS — E119 Type 2 diabetes mellitus without complications: Secondary | ICD-10-CM

## 2017-11-10 DIAGNOSIS — Z6841 Body Mass Index (BMI) 40.0 and over, adult: Secondary | ICD-10-CM

## 2017-11-10 HISTORY — DX: Type 2 diabetes mellitus without complications: E11.9

## 2017-11-10 HISTORY — DX: Low back pain: M54.5

## 2017-11-10 HISTORY — DX: Low back pain, unspecified: M54.50

## 2017-11-10 HISTORY — DX: Other chronic pain: G89.29

## 2017-11-10 LAB — HEPATIC FUNCTION PANEL
ALK PHOS: 95 U/L (ref 38–126)
ALT: 32 U/L (ref 14–54)
AST: 54 U/L — ABNORMAL HIGH (ref 15–41)
Albumin: 2.5 g/dL — ABNORMAL LOW (ref 3.5–5.0)
BILIRUBIN DIRECT: 2.5 mg/dL — AB (ref 0.1–0.5)
BILIRUBIN INDIRECT: 2 mg/dL — AB (ref 0.3–0.9)
TOTAL PROTEIN: 8.7 g/dL — AB (ref 6.5–8.1)
Total Bilirubin: 4.5 mg/dL — ABNORMAL HIGH (ref 0.3–1.2)

## 2017-11-10 LAB — BRAIN NATRIURETIC PEPTIDE: B Natriuretic Peptide: 830.7 pg/mL — ABNORMAL HIGH (ref 0.0–100.0)

## 2017-11-10 LAB — CBC
HCT: 38.2 % (ref 36.0–46.0)
HEMOGLOBIN: 11.8 g/dL — AB (ref 12.0–15.0)
MCH: 28 pg (ref 26.0–34.0)
MCHC: 30.9 g/dL (ref 30.0–36.0)
MCV: 90.5 fL (ref 78.0–100.0)
Platelets: 200 10*3/uL (ref 150–400)
RBC: 4.22 MIL/uL (ref 3.87–5.11)
RDW: 19.6 % — ABNORMAL HIGH (ref 11.5–15.5)
WBC: 6.7 10*3/uL (ref 4.0–10.5)

## 2017-11-10 LAB — BASIC METABOLIC PANEL
ANION GAP: 10 (ref 5–15)
BUN: 16 mg/dL (ref 6–20)
CALCIUM: 8.5 mg/dL — AB (ref 8.9–10.3)
CO2: 24 mmol/L (ref 22–32)
Chloride: 102 mmol/L (ref 101–111)
Creatinine, Ser: 1.19 mg/dL — ABNORMAL HIGH (ref 0.44–1.00)
GFR, EST NON AFRICAN AMERICAN: 52 mL/min — AB (ref >60)
Glucose, Bld: 104 mg/dL — ABNORMAL HIGH (ref 65–99)
POTASSIUM: 4.3 mmol/L (ref 3.5–5.1)
Sodium: 136 mmol/L (ref 135–145)

## 2017-11-10 LAB — CBG MONITORING, ED: GLUCOSE-CAPILLARY: 86 mg/dL (ref 65–99)

## 2017-11-10 LAB — I-STAT BETA HCG BLOOD, ED (MC, WL, AP ONLY)

## 2017-11-10 LAB — I-STAT TROPONIN, ED: TROPONIN I, POC: 0.01 ng/mL (ref 0.00–0.08)

## 2017-11-10 MED ORDER — SODIUM CHLORIDE 0.9% FLUSH
3.0000 mL | Freq: Two times a day (BID) | INTRAVENOUS | Status: DC
  Administered 2017-11-10 – 2017-11-13 (×5): 3 mL via INTRAVENOUS

## 2017-11-10 MED ORDER — SODIUM CHLORIDE 0.9 % IV SOLN: 250.0000 mL | INTRAVENOUS | Status: DC | PRN

## 2017-11-10 MED ORDER — FUROSEMIDE 10 MG/ML IJ SOLN
40.0000 mg | Freq: Two times a day (BID) | INTRAMUSCULAR | Status: DC
  Administered 2017-11-11: 40 mg via INTRAVENOUS
  Filled 2017-11-10 (×2): qty 4

## 2017-11-10 MED ORDER — OXYCODONE HCL 5 MG PO TABS: 5.0000 mg | ORAL_TABLET | Freq: Four times a day (QID) | ORAL | Status: DC | PRN

## 2017-11-10 MED ORDER — ENOXAPARIN SODIUM 80 MG/0.8ML ~~LOC~~ SOLN
80.0000 mg | SUBCUTANEOUS | Status: DC
  Administered 2017-11-11: 11:00:00 80 mg via SUBCUTANEOUS
  Filled 2017-11-10: qty 0.8

## 2017-11-10 MED ORDER — ZOLPIDEM TARTRATE 5 MG PO TABS: 5.0000 mg | ORAL_TABLET | Freq: Every evening | ORAL | Status: DC | PRN

## 2017-11-10 MED ORDER — FLUCONAZOLE 150 MG PO TABS
150.0000 mg | ORAL_TABLET | Freq: Once | ORAL | Status: AC
  Administered 2017-11-10: 150 mg via ORAL
  Filled 2017-11-10: qty 1

## 2017-11-10 MED ORDER — INSULIN ASPART 100 UNIT/ML ~~LOC~~ SOLN: 0.0000 [IU] | Freq: Every day | SUBCUTANEOUS | Status: DC

## 2017-11-10 MED ORDER — SULINDAC 200 MG PO TABS
200.0000 mg | ORAL_TABLET | Freq: Two times a day (BID) | ORAL | Status: DC
  Administered 2017-11-11 (×3): 200 mg via ORAL
  Filled 2017-11-10 (×4): qty 1

## 2017-11-10 MED ORDER — INSULIN GLARGINE 100 UNIT/ML ~~LOC~~ SOLN
30.0000 [IU] | Freq: Every day | SUBCUTANEOUS | Status: DC
  Administered 2017-11-11: 30 [IU] via SUBCUTANEOUS
  Filled 2017-11-10 (×4): qty 0.3

## 2017-11-10 MED ORDER — SODIUM CHLORIDE 0.9% FLUSH: 3.0000 mL | INTRAVENOUS | Status: DC | PRN

## 2017-11-10 MED ORDER — INSULIN ASPART 100 UNIT/ML ~~LOC~~ SOLN: 0.0000 [IU] | Freq: Three times a day (TID) | SUBCUTANEOUS | Status: DC

## 2017-11-10 MED ORDER — ONDANSETRON HCL 4 MG/2ML IJ SOLN: 4.0000 mg | Freq: Four times a day (QID) | INTRAMUSCULAR | Status: DC | PRN

## 2017-11-10 MED ORDER — FUROSEMIDE 10 MG/ML IJ SOLN
40.0000 mg | Freq: Once | INTRAMUSCULAR | Status: AC
  Administered 2017-11-11: 40 mg via INTRAVENOUS
  Filled 2017-11-10: qty 4

## 2017-11-10 MED ORDER — LISINOPRIL 20 MG PO TABS
20.0000 mg | ORAL_TABLET | Freq: Every day | ORAL | Status: DC
  Administered 2017-11-11: 20 mg via ORAL
  Filled 2017-11-10: qty 1

## 2017-11-10 NOTE — H&P (Signed)
History and Physical    [name] PXT:[id number] DOB: 12-Jun-1966 DOA: 11/10/2017  PCP: Benito Mccreedy, MD   Patient coming from: Home  Chief Complaint: DOE, fatigue, swelling, orthopnea   HPI: [name] is a 52 y.o. female with medical history significant for insulin-dependent diabetes mellitus and hypertension, now presenting to the emergency department for evaluation of progressive fatigue, swelling, exertional dyspnea, and orthopnea.  Patient reports that she began to develop the symptoms approximately 2 months ago, saw her PCP, underwent echocardiogram on 11/06/2017 but does not yet have results, and was started on Lasix 20 mg daily.  She reports no change in urine output with Lasix and reports that she continues to have increased swelling.  She reports swelling involving the bilateral lower extremities throughout their entirety, abdomen, and buttock.  She denies chest pain or palpitations.  She denies recent fevers or chills.  States that she recently got a new bed so she can elevate the head in order to sleep without dyspnea.  ED Course: Upon arrival to the ED, patient is found to be afebrile, saturating mid 90s on room air, and with vitals otherwise normal.  EKG features a sinus tachycardia with rate 102 and nonspecific IVCD.  Chest x-ray is notable for cardiomegaly without acute process.  Chemistry panel is notable for a serum creatinine 1.19, similar to prior.  CBC is unremarkable, troponin is within normal limits, and BNP is elevated to 831.  Patient was treated with 40 mg IV Lasix in the is hemodynamically stable, has not been in acute distress, and will be admitted to the telemetry unit for ongoing evaluation and management of acute CHF.  Review of Systems:  All other systems reviewed and apart from HPI, are negative.  Past Medical History:  Diagnosis Date  . Arthritis   . Diabetes mellitus   . Diabetic neuropathy ([place])   . Family history of adverse reaction to  anesthesia    Mother is hard to arouse and blood pressure drops  . GERD (gastroesophageal reflux disease)   . Hypertension   . Sleep apnea     Past Surgical History:  Procedure Laterality Date  . carpal tunnel repair    . CHOLECYSTECTOMY  1999  . JOINT REPLACEMENT     bllateral total knees  . MULTIPLE EXTRACTIONS WITH ALVEOLOPLASTY [name] 01/19/2015   Procedure: MULTIPLE EXTRACTIONS Three, Four, Five, Seven, eight, nine, ten, eleven, twelve, fourteen, eighteen, twenty-one, twenty-eight, twenty-nine, thirty-one with Alveoloplasty.  ;  Surgeon: Diona Browner, DDS;  Location: [facility];  Service: Oral Surgery;  Laterality: [name];  . REPLACEMENT TOTAL KNEE BILATERAL    . spinal chord repair  2011  . Toenails removed from bilateral great toes    . TONSILLECTOMY       reports that she has been smoking cigarettes.  She has a 11.00 pack-year smoking history. she has never used smokeless tobacco. She reports that she does not drink alcohol or use drugs.  Allergies  Allergen Reactions  . Acetaminophen Rash    Upset stomach    Family History  Problem Relation Age of Onset  . Kidney disease Mother   . Congestive Heart Failure Mother   . Hypertension Father      Prior to Admission medications   Medication Sig Start Date End Date Taking? Authorizing Provider  insulin glargine (LANTUS) 100 UNIT/ML injection Inject 60 Units into the skin at bedtime.    Yes [provider]  lisinopril (PRINIVIL,ZESTRIL) 20 MG tablet Take 1 tablet by  mouth daily. 09/25/16  Yes [provider]  sulindac (CLINORIL) 200 MG tablet Take 200 mg by mouth 2 (two) times daily. 10/09/17  Yes [provider]    Physical Exam: Vitals:   11/10/17 1836 11/10/17 2000  BP: 123/60 (!) 141/85  Pulse: 70 99  Resp: 16 14  Temp: 98 F (36.7 C)   TempSrc: Oral   SpO2: 95% 98%      Constitutional: NAD, calm, obese Eyes: PERTLA, lids and conjunctivae normal ENMT: Mucous membranes are moist. Posterior  pharynx clear of any exudate or lesions.   Neck: normal, supple, no masses, no thyromegaly Respiratory: clear to auscultation bilaterally, dyspnea with speech. Normal respiratory effort.   Cardiovascular: S1 & S2 heard, regular rate and rhythm.Pitting edema to bilateral legs extending up into the abdomen. Abdomen: No distension, no tenderness, no masses palpated. Bowel sounds normal.  Musculoskeletal: no clubbing / cyanosis. No joint deformity upper and lower extremities.    Skin: no significant rashes, lesions, ulcers. Warm, dry, well-perfused. Neurologic: CN 2-12 grossly intact. Sensation intact. Strength 5/5 in all 4 limbs.  Psychiatric: Alert and oriented x 3. Calm, cooperative.     Labs on Admission: I have personally reviewed following labs and imaging studies  CBC: Recent Labs  Lab 11/10/17 1822  WBC 6.7  HGB 11.8*  HCT 38.2  MCV 90.5  PLT 161   Basic Metabolic Panel: Recent Labs  Lab 11/10/17 1822  NA 136  K 4.3  CL 102  CO2 24  GLUCOSE 104*  BUN 16  CREATININE 1.19*  CALCIUM 8.5*   GFR: CrCl cannot be calculated (Unknown ideal weight.). Liver Function Tests: No results for input(s): AST, ALT, ALKPHOS, BILITOT, PROT, ALBUMIN in the last 168 hours. No results for input(s): LIPASE, AMYLASE in the last 168 hours. No results for input(s): AMMONIA in the last 168 hours. Coagulation Profile: No results for input(s): INR, PROTIME in the last 168 hours. Cardiac Enzymes: No results for input(s): CKTOTAL, CKMB, CKMBINDEX, TROPONINI in the last 168 hours. BNP (last 3 results) No results for input(s): PROBNP in the last 8760 hours. HbA1C: No results for input(s): HGBA1C in the last 72 hours. CBG: No results for input(s): GLUCAP in the last 168 hours. Lipid Profile: No results for input(s): CHOL, HDL, LDLCALC, TRIG, CHOLHDL, LDLDIRECT in the last 72 hours. Thyroid Function Tests: No results for input(s): TSH, T4TOTAL, FREET4, T3FREE, THYROIDAB in the last 72  hours. Anemia Panel: No results for input(s): VITAMINB12, FOLATE, FERRITIN, TIBC, IRON, RETICCTPCT in the last 72 hours. Urine analysis:    Component Value Date/Time   COLORURINE STRAW (A) 09/27/2016 0620   APPEARANCEUR CLEAR 09/27/2016 0620   LABSPEC 1.008 09/27/2016 0620   PHURINE 7.0 09/27/2016 0620   GLUCOSEU NEGATIVE 09/27/2016 0620   HGBUR SMALL (A) 09/27/2016 0620   BILIRUBINUR NEGATIVE 09/27/2016 0620   KETONESUR NEGATIVE 09/27/2016 0620   PROTEINUR NEGATIVE 09/27/2016 0620   UROBILINOGEN 1.0 05/16/2013 1758   NITRITE NEGATIVE 09/27/2016 0620   LEUKOCYTESUR NEGATIVE 09/27/2016 0620   Sepsis Labs: @LABRCNTIP (procalcitonin:4,lacticidven:4) )No results found for this or any previous visit (from the past 240 hour(s)).   Radiological Exams on Admission: Dg Chest 2 View  Result Date: 11/10/2017 CLINICAL DATA:  Left chest pain radiating into the left arm. Shortness of breath since 11/06/2017. EXAM: CHEST  2 VIEW COMPARISON:  PA and lateral chest 09/27/2017 and 01/17/2016. FINDINGS: The exam is limited by the patient's weight. There is cardiomegaly and pulmonary vascular congestion. No consolidative process, pneumothorax  or effusion. The patient is status post cervical fusion. IMPRESSION: Cardiomegaly without acute disease. Electronically Signed   By: Inge Rise M.D.   On: 11/10/2017 19:49    EKG: Independently reviewed. Sinus tachycardia (rate 102), non-specific IVCD.   Assessment/Plan   1. Acute CHF  - Presents with progressive edema, DOE, fatigue, and orthopnea  - Had outpatient echo on 11/06/17, working to obtain records but currently unable to classify as systolic and/or diastolic  - Recently started on Lasix 20 mg qD by PCP, but pt reports continued worsening despite this  - Treated in ED with Lasix 40 mg IV  - Continue diuresis with Lasix 40 mg IV q12h, continue lisinopril  - SLIV, fluid-restrict diet, follow daily wt and I/O's, follow daily chem panel   2.  Hypertension  - BP at goal  - Continue lisinopril, consider adding beta-blocker as tolerated    3. Insulin-dependent DM  - A1c was 11.3% remotely  - Managed at home with Lantus 60 units qHS  - Check CBG's, continue Lantus with 30 units qHS and start SSI with Novolog   4. Mild renal insufficiency  - SCr is 1.19 on admission, similar to prior  - Follow daily chem panel during diuresis    DVT prophylaxis: Lovenox Code Status: Full  Family Communication: Discussed with patient Disposition Plan: Admit to telemetry Consults called: None Admission status: Inpatient    Vianne Bulls, MD [facility] Pager [phone]  If 7PM-7AM, please contact night-coverage [email or web address] Password [facility]  11/10/2017, 10:29 PM

## 2017-11-10 NOTE — ED Notes (Signed)
Lab to add on BNP.  °

## 2017-11-10 NOTE — ED Notes (Signed)
IV team at bedside 

## 2017-11-10 NOTE — ED Notes (Signed)
ED Provider at bedside. 

## 2017-11-10 NOTE — ED Provider Notes (Signed)
[facility] EMERGENCY DEPARTMENT Provider Note   CSN: [id number] Arrival date & time: 11/10/17  1815     History   Chief Complaint Chief Complaint  Patient presents with  . Chest Pain  . Shortness of Breath    HPI [name] is a 52 y.o. female with history of hypertension, diabetes, GERD, sleep apnea who presents with a 2-3-week history of intermittent chest pain and shortness of breath on exertion.  She describes the pain as a dull ache and heaviness.  She has also had shortness of breath at night as well as coughing with laying flat.  She has had associated peripheral edema and abdominal edema that is significant.  She reports she is no longer able to bend over.  She reports her PCP found an abnormality on her EKG recently and she had an echocardiogram 5 days ago, however she is unsure of the results.  Per chart review, this is not visible to [place].  Patient has had some abdominal discomfort related to the edema.  Patient also reports a uncomfortable rash around her anus.  HPI  Past Medical History:  Diagnosis Date  . Arthritis   . Diabetes mellitus   . Diabetic neuropathy ([place])   . Family history of adverse reaction to anesthesia    Mother is hard to arouse and blood pressure drops  . GERD (gastroesophageal reflux disease)   . Hypertension   . Sleep apnea     Patient Active Problem List   Diagnosis Date Noted  . Chronic renal insufficiency, stage 2 (mild) 11/10/2017  . Acute CHF (congestive heart failure) ([place]) 11/10/2017  . Failed total knee arthroplasty, sequela 08/31/2017  . Lower extremity cellulitis 01/03/2013  . Diabetes mellitus ([place]) 01/03/2013  . OBESITY, NOS 11/12/2006  . TOBACCO DEPENDENCE 11/12/2006  . DEPRESSIVE DISORDER, NOS 11/12/2006  . HYPERTENSION, BENIGN SYSTEMIC 11/12/2006  . GASTROESOPHAGEAL REFLUX, NO ESOPHAGITIS 11/12/2006  . ACNE 11/12/2006  . OSTEOARTHRITIS, LOWER LEG 11/12/2006  . APNEA, SLEEP 11/12/2006  . CHEST PAIN  11/12/2006    Past Surgical History:  Procedure Laterality Date  . carpal tunnel repair    . CHOLECYSTECTOMY  1999  . JOINT REPLACEMENT     bllateral total knees  . MULTIPLE EXTRACTIONS WITH ALVEOLOPLASTY [name] 01/19/2015   Procedure: MULTIPLE EXTRACTIONS Three, Four, Five, Seven, eight, nine, ten, eleven, twelve, fourteen, eighteen, twenty-one, twenty-eight, twenty-nine, thirty-one with Alveoloplasty.  ;  Surgeon: Diona Browner, DDS;  Location: [facility];  Service: Oral Surgery;  Laterality: [name];  . REPLACEMENT TOTAL KNEE BILATERAL    . spinal chord repair  2011  . Toenails removed from bilateral great toes    . TONSILLECTOMY      OB History    No data available       Home Medications    Prior to Admission medications   Medication Sig Start Date End Date Taking? Authorizing Provider  insulin glargine (LANTUS) 100 UNIT/ML injection Inject 60 Units into the skin at bedtime.    Yes [provider]  lisinopril (PRINIVIL,ZESTRIL) 20 MG tablet Take 1 tablet by mouth daily. 09/25/16  Yes [provider]  sulindac (CLINORIL) 200 MG tablet Take 200 mg by mouth 2 (two) times daily. 10/09/17  Yes [provider]    Family History Family History  Problem Relation Age of Onset  . Kidney disease Mother   . Congestive Heart Failure Mother   . Hypertension Father     Social History Social History   Tobacco  Use  . Smoking status: Current Every Day Smoker    Packs/day: 0.50    Years: 22.00    Pack years: 11.00    Types: Cigarettes  . Smokeless tobacco: Never Used  Substance Use Topics  . Alcohol use: No  . Drug use: No     Allergies   Acetaminophen   Review of Systems Review of Systems  Constitutional: Negative for chills and fever.  HENT: Negative for facial swelling and sore throat.   Respiratory: Positive for shortness of breath.   Cardiovascular: Positive for chest pain and leg swelling.  Gastrointestinal: Positive for abdominal distention and  abdominal pain ("discomfort"). Negative for nausea and vomiting.  Genitourinary: Negative for dysuria.  Musculoskeletal: Negative for back pain.  Skin: Negative for rash and wound.  Neurological: Negative for headaches.  Psychiatric/Behavioral: The patient is not nervous/anxious.      Physical Exam Updated Vital Signs BP (!) 141/85   Pulse 99   Temp 98 F (36.7 C) (Oral)   Resp 14   LMP 11/09/2012   SpO2 98%   Physical Exam  Constitutional: She appears well-developed and well-nourished. No distress.  Morbidly obese  HENT:  Head: Normocephalic and atraumatic.  Mouth/Throat: Oropharynx is clear and moist. No oropharyngeal exudate.  Eyes: Conjunctivae are normal. Pupils are equal, round, and reactive to light. Right eye exhibits no discharge. Left eye exhibits no discharge. No scleral icterus.  Neck: Normal range of motion. Neck supple. No thyromegaly present.  Cardiovascular: Normal rate, regular rhythm, normal heart sounds and intact distal pulses. Exam reveals no gallop and no friction rub.  No murmur heard. Pulmonary/Chest: Effort normal. No stridor. No respiratory distress. She has decreased breath sounds. She has no wheezes. She has no rales.  Abdominal: Soft. Bowel sounds are normal. She exhibits no distension. There is no tenderness. There is no rebound and no guarding.  Pitting edema  Genitourinary:  Genitourinary Comments: Pedunculated, erythematous and white lesions around the anus, tender  Musculoskeletal:       Right lower leg: She exhibits edema.       Left lower leg: She exhibits edema.  2+ pitting edema bilaterally  Lymphadenopathy:    She has no cervical adenopathy.  Neurological: She is alert. Coordination normal.  Skin: Skin is warm and dry. No rash noted. She is not diaphoretic. No pallor.  Psychiatric: She has a normal mood and affect.  Nursing note and vitals reviewed.    ED Treatments / Results  Labs (all labs ordered are listed, but only abnormal  results are displayed) Labs Reviewed  BASIC METABOLIC PANEL - Abnormal; Notable for the following components:      Result Value   Glucose, Bld 104 (*)    Creatinine, Ser 1.19 (*)    Calcium 8.5 (*)    GFR calc non Af Amer 52 (*)    All other components within normal limits  CBC - Abnormal; Notable for the following components:   Hemoglobin 11.8 (*)    RDW 19.6 (*)    All other components within normal limits  BRAIN NATRIURETIC PEPTIDE - Abnormal; Notable for the following components:   B Natriuretic Peptide 830.7 (*)    All other components within normal limits  HIV ANTIBODY (ROUTINE TESTING)  BASIC METABOLIC PANEL  I-STAT TROPONIN, ED  I-STAT BETA HCG BLOOD, ED ([facility], [facility], [facility] ONLY)    EKG  EKG Interpretation  Date/Time:  Tuesday November 10 2017 18:18:35 EST Ventricular Rate:  102 PR Interval:  156 QRS  Duration: 142 QT Interval:  386 QTC Calculation: 503 R Axis:   -95 Text Interpretation:  Sinus tachycardia Non-specific intra-ventricular conduction block Possible Lateral infarct , age undetermined Abnormal ECG conduction delay new since May 2016 Confirmed by Sherwood Gambler [id number]) on 11/10/2017 7:39:00 PM       Radiology Dg Chest 2 View  Result Date: 11/10/2017 CLINICAL DATA:  Left chest pain radiating into the left arm. Shortness of breath since 11/06/2017. EXAM: CHEST  2 VIEW COMPARISON:  PA and lateral chest 09/27/2017 and 01/17/2016. FINDINGS: The exam is limited by the patient's weight. There is cardiomegaly and pulmonary vascular congestion. No consolidative process, pneumothorax or effusion. The patient is status post cervical fusion. IMPRESSION: Cardiomegaly without acute disease. Electronically Signed   By: Inge Rise M.D.   On: 11/10/2017 19:49    Procedures Procedures (including critical care time)  Medications Ordered in ED Medications  furosemide (LASIX) injection 40 mg (not administered)  fluconazole (DIFLUCAN) tablet 150 mg (not administered)    lisinopril (PRINIVIL,ZESTRIL) tablet 20 mg (not administered)  sodium chloride flush (NS) 0.9 % injection 3 mL (not administered)  sodium chloride flush (NS) 0.9 % injection 3 mL (not administered)  0.9 %  sodium chloride infusion (not administered)  ondansetron (ZOFRAN) injection 4 mg (not administered)  enoxaparin (LOVENOX) injection 40 mg (not administered)  furosemide (LASIX) injection 40 mg (not administered)  zolpidem (AMBIEN) tablet 5 mg (not administered)  insulin glargine (LANTUS) injection 30 Units (not administered)  insulin aspart (novoLOG) injection 0-9 Units (not administered)  insulin aspart (novoLOG) injection 0-5 Units (not administered)  sulindac (CLINORIL) tablet 200 mg (not administered)  oxyCODONE (Oxy IR/ROXICODONE) immediate release tablet 5-10 mg (not administered)     Initial Impression / Assessment and Plan / ED Course  I have reviewed the triage vital signs and the nursing notes.  Pertinent labs & imaging results that were available during my care of the patient were reviewed by me and considered in my medical decision making (see chart for details).     Patient with suspected new onset CHF with peripheral and abdominal edema.  BNP 830 0.7.  CBC shows hemoglobin 11.8.  BMP shows creatinine 1.19.  Troponin negative. CXR shows cardiomegaly without acute disease.  EKG shows new ventricular conduction block, but otherwise has reportedly seen PCP, which prompted echocardiogram was done 5 days ago.  Unable to see this in chart review.  Patient taking Lasix 20 mg daily at home.  Will order 40 mg in the ED and admit for diuresis.  I consulted [facility] and spoke with Dr. Myna Hidalgo who will admit the patient for further evaluation and treatment.  Appreciate their assistance.  Also evaluated by Dr. Regenia Skeeter who agrees with plan.  Final Clinical Impressions(s) / ED Diagnoses   Final diagnoses:  Acute congestive heart failure, unspecified heart failure type (HCC)   Shortness of breath  Edema due to congestive heart failure [facility])    ED Discharge Orders    None       Frederica Kuster, PA-C 11/10/17 2233    Sherwood Gambler, MD 11/11/17 1500

## 2017-11-10 NOTE — ED Triage Notes (Signed)
Pt c/o of cp and sob, pt states she recently had an echo and has not gotten those results. Pt reports cp has been intermittent for the last week. Pts ekg shows new block per Regenia Skeeter.

## 2017-11-11 ENCOUNTER — Other Ambulatory Visit: Payer: Self-pay

## 2017-11-11 DIAGNOSIS — I509 Heart failure, unspecified: Secondary | ICD-10-CM

## 2017-11-11 LAB — GLUCOSE, CAPILLARY
GLUCOSE-CAPILLARY: 119 mg/dL — AB (ref 65–99)
GLUCOSE-CAPILLARY: 104 mg/dL — AB (ref 65–99)
Glucose-Capillary: 106 mg/dL — ABNORMAL HIGH (ref 65–99)
Glucose-Capillary: 77 mg/dL (ref 65–99)
Glucose-Capillary: 91 mg/dL (ref 65–99)

## 2017-11-11 LAB — BASIC METABOLIC PANEL
ANION GAP: 4 — AB (ref 5–15)
BUN: 18 mg/dL (ref 6–20)
CALCIUM: 8.2 mg/dL — AB (ref 8.9–10.3)
CO2: 28 mmol/L (ref 22–32)
CREATININE: 1.19 mg/dL — AB (ref 0.44–1.00)
Chloride: 106 mmol/L (ref 101–111)
GFR, EST NON AFRICAN AMERICAN: 52 mL/min — AB (ref >60)
Glucose, Bld: 96 mg/dL (ref 65–99)
Potassium: 4.3 mmol/L (ref 3.5–5.1)
SODIUM: 138 mmol/L (ref 135–145)

## 2017-11-11 LAB — HIV ANTIBODY (ROUTINE TESTING W REFLEX): HIV SCREEN 4TH GENERATION: NONREACTIVE

## 2017-11-11 MED ORDER — METHADONE HCL 10 MG PO TABS
115.0000 mg | ORAL_TABLET | Freq: Every day | ORAL | Status: DC
  Administered 2017-11-11 – 2017-11-13 (×3): 115 mg via ORAL
  Filled 2017-11-11: qty 12
  Filled 2017-11-11: qty 23
  Filled 2017-11-11: qty 12

## 2017-11-11 MED ORDER — ENOXAPARIN SODIUM 100 MG/ML ~~LOC~~ SOLN
90.0000 mg | SUBCUTANEOUS | Status: DC
  Administered 2017-11-12 – 2017-11-13 (×2): 90 mg via SUBCUTANEOUS
  Filled 2017-11-11 (×2): qty 1

## 2017-11-11 MED ORDER — FUROSEMIDE 10 MG/ML IJ SOLN
60.0000 mg | Freq: Two times a day (BID) | INTRAMUSCULAR | Status: DC
  Administered 2017-11-11: 60 mg via INTRAVENOUS
  Filled 2017-11-11: qty 6

## 2017-11-11 NOTE — Progress Notes (Signed)
RT brought CPAP up to pt's room and set up. Nurse stated he can place pt on since pt was not ready. RT told RN to call if they needed help. RT will continue to monitor as needed.

## 2017-11-11 NOTE — Progress Notes (Signed)
Patient resting comfortably during shift report. Denies complaints.  

## 2017-11-11 NOTE — Progress Notes (Signed)
Pharmacy note: Methadone  Methadone dose confirmed as 115mg  po daily (last dose 11/10/16) [place] asked to resume home regimen -Methadone 115 mg po daily  Hildred Laser, PharmD Clinical Pharmacist Clinical phone from 8:30-4:00 is x2- After 4pm, please call [facility] [phone]) for assistance. 11/11/2017 1:16 PM

## 2017-11-11 NOTE — Progress Notes (Signed)
PROGRESS NOTE    [name]  WUJ:[id number] DOB: Jan 29, 1966 DOA: 11/10/2017 PCP: Benito Mccreedy, MD   Brief Narrative:   52 y.o. female with medical history significant for insulin-dependent diabetes mellitus and hypertension, now presenting to the emergency department for evaluation of progressive fatigue, swelling, exertional dyspnea, and orthopnea.   Pt reports that she has not had much diuresis on lasix 40 mg. Also discussed history of methadone use and is requesting restarting on this medication  Assessment & Plan:   Principal Problem:   Acute CHF (congestive heart failure) (HCC) - Pt started on lasix 40 mg IV bid without much improvement or diuresis per patient. Will increase to 60 mg IV bid - patient is 120 net negative on review of I and O's.  - wgt 196.5 >>196.1 - reportedly had Echo at the beginning of this month. Plan is to obtain medical records and order has been placed.  Active Problems:   HYPERTENSION, BENIGN SYSTEMIC -  Pt is on lisinopril  Chronic pain - patient on methadone. Consulted pharmacy to confirm and restart    Diabetes mellitus ([place]) - Pt is on lantus and on SSI with night coverage    Chronic renal insufficiency, stage 2 (mild) - stable at this point. Will monitor serum creatinine while diuresing - srum creatinine ranges in past from 0.7-1.2  DVT prophylaxis: Lovenox Code Status: Full Family Communication: d/c patient directly Disposition Plan: p   Consultants:   none   Procedures: none   Antimicrobials: none   Subjective: Pt would like to be placed back on her methadone. As this was not continued initially. Patient reports history of chronic pain  Objective: Vitals:   11/10/17 2300 11/10/17 2342 11/11/17 0601 11/11/17 1214  BP:  120/83 (!) 105/58 (!) 104/45  Pulse:  86 82 79  Resp:  18 18 20   Temp:  98.1 F (36.7 C) 98.3 F (36.8 C) 98.8 F (37.1 C)  TempSrc:  Oral Oral Oral  SpO2:  94% 95% 94%  Weight: (!) 166 kg  (365 lb 15.4 oz) (!) 196.5 kg (433 lb 4.8 oz) (!) 196.2 kg (432 lb 8 oz)   Height: 5\' 4"  (1.626 m) 5\' 4"  (1.626 m)      Intake/Output Summary (Last 24 hours) at 11/11/2017 1248 Last data filed at 11/11/2017 0943 Gross per 24 hour  Intake 480 ml  Output 600 ml  Net -120 ml   Filed Weights   11/10/17 2300 11/10/17 2342 11/11/17 0601  Weight: (!) 166 kg (365 lb 15.4 oz) (!) 196.5 kg (433 lb 4.8 oz) (!) 196.2 kg (432 lb 8 oz)    Examination:  General exam: Appears calm and comfortable  Respiratory system: Clear to auscultation. Respiratory effort normal. Cardiovascular system: S1 & S2 heard, RRR. No JVD Gastrointestinal system: Abdomen is nondistended, soft and nontender. No organomegaly or masses felt. Normal bowel sounds heard. Central nervous system: Alert and oriented. No focal neurological deficits. Extremities: warm, + pulses Skin: No rashes, lesions, or ulcers, on limited exam. Psychiatry: Mood & affect appropriate.   Data Reviewed: I have personally reviewed following labs and imaging studies  CBC: Recent Labs  Lab 11/10/17 1822  WBC 6.7  HGB 11.8*  HCT 38.2  MCV 90.5  PLT 956   Basic Metabolic Panel: Recent Labs  Lab 11/10/17 1822 11/11/17 0440  NA 136 138  K 4.3 4.3  CL 102 106  CO2 24 28  GLUCOSE 104* 96  BUN 16 18  CREATININE 1.19* 1.19*  CALCIUM 8.5* 8.2*   GFR: Estimated Creatinine Clearance: 99.4 mL/min (A) (by C-G formula based on SCr of 1.19 mg/dL (H)). Liver Function Tests: Recent Labs  Lab 11/10/17 1834  AST 54*  ALT 32  ALKPHOS 95  BILITOT 4.5*  PROT 8.7*  ALBUMIN 2.5*   No results for input(s): LIPASE, AMYLASE in the last 168 hours. No results for input(s): AMMONIA in the last 168 hours. Coagulation Profile: No results for input(s): INR, PROTIME in the last 168 hours. Cardiac Enzymes: No results for input(s): CKTOTAL, CKMB, CKMBINDEX, TROPONINI in the last 168 hours. BNP (last 3 results) No results for input(s): PROBNP in the  last 8760 hours. HbA1C: No results for input(s): HGBA1C in the last 72 hours. CBG: Recent Labs  Lab 11/10/17 2305 11/11/17 0012 11/11/17 0746 11/11/17 1139  GLUCAP 86 77 91 119*   Lipid Profile: No results for input(s): CHOL, HDL, LDLCALC, TRIG, CHOLHDL, LDLDIRECT in the last 72 hours. Thyroid Function Tests: No results for input(s): TSH, T4TOTAL, FREET4, T3FREE, THYROIDAB in the last 72 hours. Anemia Panel: No results for input(s): VITAMINB12, FOLATE, FERRITIN, TIBC, IRON, RETICCTPCT in the last 72 hours. Sepsis Labs: No results for input(s): PROCALCITON, LATICACIDVEN in the last 168 hours.  No results found for this or any previous visit (from the past 240 hour(s)).    Radiology Studies: Dg Chest 2 View  Result Date: 11/10/2017 CLINICAL DATA:  Left chest pain radiating into the left arm. Shortness of breath since 11/06/2017. EXAM: CHEST  2 VIEW COMPARISON:  PA and lateral chest 09/27/2017 and 01/17/2016. FINDINGS: The exam is limited by the patient's weight. There is cardiomegaly and pulmonary vascular congestion. No consolidative process, pneumothorax or effusion. The patient is status post cervical fusion. IMPRESSION: Cardiomegaly without acute disease. Electronically Signed   By: Inge Rise M.D.   On: 11/10/2017 19:49    Scheduled Meds: . enoxaparin (LOVENOX) injection  80 mg Subcutaneous Q24H  . furosemide  40 mg Intravenous Q12H  . insulin aspart  0-5 Units Subcutaneous QHS  . insulin aspart  0-9 Units Subcutaneous TID WC  . insulin glargine  30 Units Subcutaneous QHS  . lisinopril  20 mg Oral Daily  . sodium chloride flush  3 mL Intravenous Q12H  . sulindac  200 mg Oral BID   Continuous Infusions: . sodium chloride       LOS: 1 day    Time spent: > 35 minutes  Velvet Bathe, MD [facility] Pager [phone]  If 7PM-7AM, please contact night-coverage [email or web address] Password [facility] 11/11/2017, 12:48 PM

## 2017-11-11 NOTE — Plan of Care (Signed)
Pt. Weak and unable to assist with repositioning in bed. Pt. In bariatric bed.

## 2017-11-12 LAB — BASIC METABOLIC PANEL
Anion gap: 7 (ref 5–15)
BUN: 22 mg/dL — ABNORMAL HIGH (ref 6–20)
CHLORIDE: 104 mmol/L (ref 101–111)
CO2: 27 mmol/L (ref 22–32)
CREATININE: 1.72 mg/dL — AB (ref 0.44–1.00)
Calcium: 7.9 mg/dL — ABNORMAL LOW (ref 8.9–10.3)
GFR calc Af Amer: 39 mL/min — ABNORMAL LOW (ref >60)
GFR calc non Af Amer: 34 mL/min — ABNORMAL LOW (ref >60)
GLUCOSE: 80 mg/dL (ref 65–99)
Potassium: 4.5 mmol/L (ref 3.5–5.1)
Sodium: 138 mmol/L (ref 135–145)

## 2017-11-12 LAB — GLUCOSE, CAPILLARY
Glucose-Capillary: 99 mg/dL (ref 65–99)
Glucose-Capillary: 83 mg/dL (ref 65–99)
Glucose-Capillary: 85 mg/dL (ref 65–99)

## 2017-11-12 MED ORDER — NICOTINE 14 MG/24HR TD PT24
14.0000 mg | MEDICATED_PATCH | Freq: Every day | TRANSDERMAL | Status: DC
  Administered 2017-11-12 – 2017-11-13 (×2): 14 mg via TRANSDERMAL
  Filled 2017-11-12 (×2): qty 1

## 2017-11-12 MED ORDER — FUROSEMIDE 10 MG/ML IJ SOLN
60.0000 mg | Freq: Two times a day (BID) | INTRAMUSCULAR | Status: DC
  Administered 2017-11-12 (×2): 60 mg via INTRAVENOUS
  Filled 2017-11-12 (×2): qty 6

## 2017-11-12 NOTE — Progress Notes (Signed)
PT Cancellation Note  Patient Details Name: [name] MRN: [id number] DOB: 17-Mar-1966   Cancelled Treatment:    Reason Eval/Treat Not Completed: Other (comment). Pt is up to stand with OT just prior to PT arrival and will try after lunch.   Ramond Dial 11/12/2017, 11:29 AM   Mee Hives, PT MS Acute Rehab Dept. Number: [place] and [place]

## 2017-11-12 NOTE — Progress Notes (Signed)
RN assessed pressure injury on pt's left/lower/inner buttocks. Per pt pressure injury was present prior to admission. RN placed pink foam.

## 2017-11-12 NOTE — Progress Notes (Signed)
Pt with weight discrepancy. Pt weighed on [place] Bed on admission, but was transferred to a bariatric bed soon after. Pt remains on bariatric bed this AM and weight obtained. Pt reportedly unable to get out of bed. PT/OT evals in place. Will continue to monitor.

## 2017-11-12 NOTE — Progress Notes (Signed)
PROGRESS NOTE    [name]  RCB:[id number] DOB: [birth year] DOA: 11/10/2017 PCP: Benito Mccreedy, MD   Brief Narrative:   52 y.o. female with medical history significant for insulin-dependent diabetes mellitus and hypertension, now presenting to the emergency department for evaluation of progressive fatigue, swelling, exertional dyspnea, and orthopnea.   Pt reports improvement in swelling. No pain reported today once started back on her methadone. Reports trouble with mobility recently due to increased water weight.  Assessment & Plan:   Principal Problem:   Acute CHF (congestive heart failure) (HCC) - Pt started on lasix 40 mg IV bid without much improvement or diuresis per patient. Continue 60 mg IV BID will not increased due to elevation in serum creatinine. - I and O's and weights currently not suspected to be accurate. Patient reports improvement in condition however. - reportedly had Echo at the beginning of this month. Order placed to obtain records of recent echocardiogram earlier this month.  ARF on chronic stage II - Will discontinue NSAID and hold ACE Inhibitor - maintain lasix at 60 mg IV bid and monitor serum creatinine closely.  Active Problems:   HYPERTENSION, BENIGN SYSTEMIC -  Hold secondary to new problem listed above  Chronic pain - Continue patient's home methadone dose discussed with pharmacy    Diabetes mellitus ([place]) - Pt is on lantus and on SSI with night coverage will continue   DVT prophylaxis: Lovenox Code Status: Full Family Communication: d/c patient directly Disposition Plan: p   Consultants:   none   Procedures: none   Antimicrobials: none   Subjective: Patient reports no new complaints today. Reports that her swelling is going down however she is not at baseline per my discussion with her and she is having difficulties with mobility  Objective: Vitals:   11/12/17 0004 11/12/17 0428 11/12/17 0454 11/12/17 0459  BP: (!)  98/54 (!) 87/44 (!) 78/37 90/60  Pulse: 76 69    Resp: 20 20    Temp: 98.5 F (36.9 C) 98.5 F (36.9 C)    TempSrc: Oral Oral    SpO2: 95% 95%    Weight:  (!) 207.7 kg (458 lb)    Height:        Intake/Output Summary (Last 24 hours) at 11/12/2017 1159 Last data filed at 11/12/2017 0105 Gross per 24 hour  Intake 960 ml  Output 751 ml  Net 209 ml   Filed Weights   11/10/17 2342 11/11/17 0601 11/12/17 0428  Weight: (!) 196.5 kg (433 lb 4.8 oz) (!) 196.2 kg (432 lb 8 oz) (!) 207.7 kg (458 lb)    Examination:  General exam: Appears calm and comfortable , no acute distress Respiratory system: Clear to auscultation. Respiratory effort normal. No rales or wheezes Cardiovascular system: S1 & S2 heard, RRR. No JVD Gastrointestinal system: Abdomen is nondistended, soft and nontender. No organomegaly or masses felt. Normal bowel sounds heard. Central nervous system: . No focal neurological deficits. Alert and oriented Extremities: warm, + pulses Skin: No rashes, lesions, or ulcers, on limited exam. Psychiatry: Mood & affect appropriate.   Data Reviewed: I have personally reviewed following labs and imaging studies  CBC: Recent Labs  Lab 11/10/17 1822  WBC 6.7  HGB 11.8*  HCT 38.2  MCV 90.5  PLT 803   Basic Metabolic Panel: Recent Labs  Lab 11/10/17 1822 11/11/17 0440 11/12/17 0452  NA 136 138 138  K 4.3 4.3 4.5  CL 102 106 104  CO2 24 28 27   GLUCOSE  104* 96 80  BUN 16 18 22*  CREATININE 1.19* 1.19* 1.72*  CALCIUM 8.5* 8.2* 7.9*   GFR: Estimated Creatinine Clearance: 71.6 mL/min (A) (by C-G formula based on SCr of 1.72 mg/dL (H)). Liver Function Tests: Recent Labs  Lab 11/10/17 1834  AST 54*  ALT 32  ALKPHOS 95  BILITOT 4.5*  PROT 8.7*  ALBUMIN 2.5*   No results for input(s): LIPASE, AMYLASE in the last 168 hours. No results for input(s): AMMONIA in the last 168 hours. Coagulation Profile: No results for input(s): INR, PROTIME in the last 168  hours. Cardiac Enzymes: No results for input(s): CKTOTAL, CKMB, CKMBINDEX, TROPONINI in the last 168 hours. BNP (last 3 results) No results for input(s): PROBNP in the last 8760 hours. HbA1C: No results for input(s): HGBA1C in the last 72 hours. CBG: Recent Labs  Lab 11/11/17 0746 11/11/17 1139 11/11/17 1632 11/11/17 2117 11/12/17 1133  GLUCAP 91 119* 106* 104* 99   Lipid Profile: No results for input(s): CHOL, HDL, LDLCALC, TRIG, CHOLHDL, LDLDIRECT in the last 72 hours. Thyroid Function Tests: No results for input(s): TSH, T4TOTAL, FREET4, T3FREE, THYROIDAB in the last 72 hours. Anemia Panel: No results for input(s): VITAMINB12, FOLATE, FERRITIN, TIBC, IRON, RETICCTPCT in the last 72 hours. Sepsis Labs: No results for input(s): PROCALCITON, LATICACIDVEN in the last 168 hours.  No results found for this or any previous visit (from the past 240 hour(s)).    Radiology Studies: Dg Chest 2 View  Result Date: 11/10/2017 CLINICAL DATA:  Left chest pain radiating into the left arm. Shortness of breath since 11/06/2017. EXAM: CHEST  2 VIEW COMPARISON:  PA and lateral chest 09/27/2017 and 01/17/2016. FINDINGS: The exam is limited by the patient's weight. There is cardiomegaly and pulmonary vascular congestion. No consolidative process, pneumothorax or effusion. The patient is status post cervical fusion. IMPRESSION: Cardiomegaly without acute disease. Electronically Signed   By: Inge Rise M.D.   On: 11/10/2017 19:49    Scheduled Meds: . enoxaparin (LOVENOX) injection  90 mg Subcutaneous Q24H  . furosemide  60 mg Intravenous BID  . insulin aspart  0-5 Units Subcutaneous QHS  . insulin aspart  0-9 Units Subcutaneous TID WC  . insulin glargine  30 Units Subcutaneous QHS  . methadone  115 mg Oral Daily  . sodium chloride flush  3 mL Intravenous Q12H   Continuous Infusions: . sodium chloride       LOS: 2 days    Time spent: > 35 minutes  Velvet Bathe, MD [facility] Pager [phone]  If 7PM-7AM, please contact night-coverage [email or web address] Password [facility] 11/12/2017, 11:59 AM

## 2017-11-12 NOTE — Care Management Note (Signed)
Case Management Note  Patient Details  Name: [name] MRN: [id number] Date of Birth: [birth year]  Subjective/Objective: CHF                  Action/Plan: Patient lives at home;PCP: Osei-Bonsu MD; has private insurance with [facility] with prescription drug coverage; awaiting for Physical Therapy eval for disposition needs HHC vs short term SNF; CM will continue to follow for disposition needs. Expected Discharge Date:   possibly 11/14/2017               Expected Discharge Plan:  [place]  Discharge planning Services  CM Consult  Status of Service:  In process, will continue to follow  Sherrilyn Rist [phone] 11/12/2017, 1:57 PM

## 2017-11-12 NOTE — Evaluation (Signed)
Physical Therapy Evaluation Patient Details Name: [name] MRN: [id number] DOB: May 28, 1966 Today's Date: 11/12/2017   History of Present Illness  Pt is a 52 y.o. female with PMHx significant for DM, HTN, GERD, Sleep apnea, Diabetic neuropathy, Arthritis, Bil TKA presenting with progressive fatigue, swelling, exertional dyspena, and orthopena.  Clinical Impression  Pt is up to walk with assistance and noted her difficulty with maintaining a forward posture due to her abd girth and edema of same.  Pt was able to stand from the bed and the chair with significant help and use of two person max assist but is a higher fall risk. Talked with pt and her family about SNF option to see if she might be willing to be considered and pt will likely defer to home.  Talked with her about personal safety and she is informed, thinking about this.    Follow Up Recommendations SNF    Equipment Recommendations  Rolling walker with 5" wheels;3in1 (PT)    Recommendations for Other Services       Precautions / Restrictions Precautions Precautions: Fall Restrictions Weight Bearing Restrictions: No Other Position/Activity Restrictions: needs to be sitting very upright to stand       Mobility  Bed Mobility Overal bed mobility: Needs Assistance Bed Mobility: Rolling;Supine to Sit;Sidelying to Sit Rolling: Mod assist Sidelying to sit: Max assist;+2 for physical assistance;+2 for safety/equipment Supine to sit: Max assist;+2 for physical assistance;+2 for safety/equipment     General bed mobility comments: Assist for trunk elevation to sitting and scooting hips out to EOB.   Transfers Overall transfer level: Needs assistance Equipment used: Rolling walker (2 wheeled) Transfers: Sit to/from Stand Sit to Stand: Max assist;+2 physical assistance;+2 safety/equipment;From elevated surface         General transfer comment: assisted due to fatigue with first evaluation earlier  today  Ambulation/Gait             General Gait Details: sidesteps and pivot only for transfers  Stairs            Wheelchair Mobility    Modified Rankin (Stroke Patients Only)       Balance Overall balance assessment: Needs assistance;History of Falls Sitting-balance support: Feet supported;Bilateral upper extremity supported Sitting balance-Leahy Scale: Fair     Standing balance support: Bilateral upper extremity supported;During functional activity Standing balance-Leahy Scale: Poor Standing balance comment: RW for support                             Pertinent Vitals/Pain Pain Assessment: Faces Faces Pain Scale: Hurts little more Pain Location: R leg on side of bed but generally sore with mobility Pain Descriptors / Indicators: Tender Pain Intervention(s): Limited activity within patient's tolerance;Monitored during session;Premedicated before session;Repositioned    Home Living Family/patient expects to be discharged to:: Skilled nursing facility Living Arrangements: Children Available Help at Discharge: Family;Personal care attendant;Available 24 hours/day(attendant 35 hours a week) Type of Home: House Home Access: Stairs to enter Entrance Stairs-Rails: None Entrance Stairs-Number of Steps: 1 Home Layout: One level Home Equipment: Walker - 2 wheels;Wheelchair - power;Hospital bed Additional Comments: requesting to get new RW, BSC, shower chair    Prior Function Level of Independence: Needs assistance   Gait / Transfers Assistance Needed: RW for mobility; limited most recently due to swelling and fatigue  ADL's / Homemaking Assistance Needed: Aide assists with bathing and dressing. Pt reports she was able to ambulate to bathroom for toileting  but did not get in tub recently for bathing        Hand Dominance        Extremity/Trunk Assessment   Upper Extremity Assessment Upper Extremity Assessment: Overall WFL for tasks assessed     Lower Extremity Assessment Lower Extremity Assessment: Generalized weakness    Cervical / Trunk Assessment Cervical / Trunk Assessment: Normal  Communication   Communication: No difficulties  Cognition Arousal/Alertness: Awake/alert Behavior During Therapy: WFL for tasks assessed/performed Overall Cognitive Status: Within Functional Limits for tasks assessed                                        General Comments General comments (skin integrity, edema, etc.): pt has significant edema on LE's and did elevate legs to reduce and manage her comfort    Exercises     Assessment/Plan    PT Assessment Patient needs continued PT services  PT Problem List Decreased strength;Decreased range of motion;Decreased activity tolerance;Decreased balance;Decreased mobility;Decreased coordination;Decreased knowledge of use of DME;Decreased safety awareness;Decreased skin integrity;Obesity;Cardiopulmonary status limiting activity       PT Treatment Interventions DME instruction;Gait training;Functional mobility training;Stair training;Therapeutic activities;Therapeutic exercise;Balance training;Neuromuscular re-education;Patient/family education    PT Goals (Current goals can be found in the Care Plan section)  Acute Rehab PT Goals Patient Stated Goal: reduce swelling PT Goal Formulation: With patient/family Time For Goal Achievement: 11/26/17 Potential to Achieve Goals: Good    Frequency Min 3X/week   Barriers to discharge Inaccessible home environment;Decreased caregiver support has a step to enter house with no rails, has time with one person to assist at home    Co-evaluation               AM-PAC PT "6 Clicks" Daily Activity  Outcome Measure Difficulty turning over in bed (including adjusting bedclothes, sheets and blankets)?: Unable Difficulty moving from lying on back to sitting on the side of the bed? : Unable Difficulty sitting down on and standing up  from a chair with arms (e.g., wheelchair, bedside commode, etc,.)?: Unable Help needed moving to and from a bed to chair (including a wheelchair)?: A Lot Help needed walking in hospital room?: A Lot Help needed climbing 3-5 steps with a railing? : Total 6 Click Score: 8    End of Session Equipment Utilized During Treatment: Gait belt Activity Tolerance: Patient limited by fatigue;Other (comment);Treatment limited secondary to medical complications (Comment)(LE weakness and edema) Patient left: in chair;with call bell/phone within reach;with family/visitor present Nurse Communication: Mobility status PT Visit Diagnosis: Unsteadiness on feet (R26.81);Other abnormalities of gait and mobility (R26.89);Muscle weakness (generalized) (M62.81);Difficulty in walking, not elsewhere classified (R26.2);Adult, failure to thrive (R62.7)    Time: 1200-1243 PT Time Calculation (min) (ACUTE ONLY): 43 min   Charges:   PT Evaluation $PT Eval Moderate Complexity: 1 Mod PT Treatments $Therapeutic Activity: 23-37 mins   PT G Codes:   PT G-Codes **NOT FOR INPATIENT CLASS** Functional Assessment Tool Used: AM-PAC 6 Clicks Basic Mobility    Ramond Dial 11/12/2017, 2:32 PM   Mee Hives, PT MS Acute Rehab Dept. Number: [place]

## 2017-11-12 NOTE — Progress Notes (Signed)
RN weighed pt on standing scale with PT assistance. Weight charted.

## 2017-11-12 NOTE — Evaluation (Signed)
Occupational Therapy Evaluation Patient Details Name: [name] MRN: [id number] DOB: February 23, 1966 Today's Date: 11/12/2017    History of Present Illness Pt is a 52 y.o. female with PMHx significant for DM, HTN, GERD, Sleep apnea, Diabetic neuropathy, Arthritis, Bil TKA presenting with progressive fatigue, swelling, exertional dyspena, and orthopena.   Clinical Impression   Pt reports she required assist with ADL PTA. Currently pt overall mod assist for UB ADL in sitting, total assist for LB ADL, and requires mod assist +2 for sit to stand. Pt presenting with generalized weakness and deconditioning impacting her independence and safety with ADL and functional mobility. Recommending SNF for follow up to maximize independence and safety with ADL and functional mobility prior to return home. Pt would benefit from continued skilled OT to address established goals.    Follow Up Recommendations  SNF;Supervision/Assistance - 24 hour    Equipment Recommendations  3 in 1 bedside commode;Tub/shower bench;Other (comment)(AE, pt will need bari equipment)    Recommendations for Other Services PT consult     Precautions / Restrictions Precautions Precautions: Fall Restrictions Weight Bearing Restrictions: No      Mobility Bed Mobility Overal bed mobility: Needs Assistance Bed Mobility: Rolling;Supine to Sit;Sit to Supine Rolling: Mod assist   Supine to sit: Mod assist;+2 for physical assistance;HOB elevated Sit to supine: Total assist;+2 for physical assistance   General bed mobility comments: Assist for trunk elevation to sitting and scooting hips out to EOB. Total assist for return to bed.  Transfers Overall transfer level: Needs assistance Equipment used: Rolling walker (2 wheeled) Transfers: Sit to/from Stand Sit to Stand: Mod assist;+2 physical assistance         General transfer comment: Assist to boost up from EOB x1 and for balance in standing    Balance Overall  balance assessment: Needs assistance;History of Falls Sitting-balance support: Feet supported;Bilateral upper extremity supported Sitting balance-Leahy Scale: Fair     Standing balance support: Bilateral upper extremity supported Standing balance-Leahy Scale: Poor Standing balance comment: RW for support                           ADL either performed or assessed with clinical judgement   ADL Overall ADL's : Needs assistance/impaired Eating/Feeding: Set up;Bed level   Grooming: Set up;Bed level   Upper Body Bathing: Moderate assistance;Sitting   Lower Body Bathing: Total assistance;+2 for physical assistance;Sit to/from stand   Upper Body Dressing : Moderate assistance;Sitting   Lower Body Dressing: Total assistance;+2 for physical assistance;Sit to/from stand       Toileting- Water quality scientist and Hygiene: Total assistance;+2 for physical assistance;Sit to/from stand Toileting - Clothing Manipulation Details (indicate cue type and reason): for peri care in standing       General ADL Comments: Pt required mod assist +2 for sit to stand from EOB. Tolerated standing at EOB x5 minutes with RW for support. Discussed post acute rehab, pt is agreeable if needed     Vision         Perception     Praxis      Pertinent Vitals/Pain Pain Assessment: Faces Faces Pain Scale: Hurts a little bit Pain Location: generalized Pain Descriptors / Indicators: Discomfort Pain Intervention(s): Monitored during session;Repositioned     Hand Dominance     Extremity/Trunk Assessment Upper Extremity Assessment Upper Extremity Assessment: Generalized weakness   Lower Extremity Assessment Lower Extremity Assessment: Defer to PT evaluation       Communication Communication Communication:  No difficulties   Cognition Arousal/Alertness: Awake/alert Behavior During Therapy: WFL for tasks assessed/performed Overall Cognitive Status: Within Functional Limits for tasks  assessed                                     General Comments       Exercises     Shoulder Instructions      Home Living Family/patient expects to be discharged to:: Private residence Living Arrangements: Children Available Help at Discharge: Family;Personal care attendant;Available 24 hours/day Type of Home: House       Home Layout: One level     Bathroom Shower/Tub: Teacher, early years/pre: Standard     Home Equipment: Environmental consultant - 2 wheels          Prior Functioning/Environment Level of Independence: Needs assistance  Gait / Transfers Assistance Needed: RW for mobility; limited most recently due to swelling and fatigue ADL's / Homemaking Assistance Needed: Aide assists with bathing and dressing. Pt reports she was able to ambulate to bathroom for toileting but did not get in tub recently for bathing            OT Problem List: Decreased strength;Decreased range of motion;Decreased activity tolerance;Impaired balance (sitting and/or standing);Decreased knowledge of use of DME or AE;Obesity;Impaired UE functional use;Increased edema;Pain      OT Treatment/Interventions: Self-care/ADL training;Therapeutic exercise;Energy conservation;DME and/or AE instruction;Therapeutic activities;Patient/family education;Balance training    OT Goals(Current goals can be found in the care plan section) Acute Rehab OT Goals Patient Stated Goal: reduce swelling OT Goal Formulation: With patient Time For Goal Achievement: 11/26/17 Potential to Achieve Goals: Good ADL Goals Pt Will Perform Grooming: with supervision;sitting Pt Will Perform Upper Body Bathing: with min assist;sitting Pt Will Perform Lower Body Bathing: with mod assist;sit to/from stand;with adaptive equipment Pt Will Transfer to Toilet: with min assist;bedside commode;stand pivot transfer  OT Frequency: Min 2X/week   Barriers to D/C:            Co-evaluation              AM-PAC  PT "6 Clicks" Daily Activity     Outcome Measure Help from another person eating meals?: A Little Help from another person taking care of personal grooming?: A Little Help from another person toileting, which includes using toliet, bedpan, or urinal?: A Lot Help from another person bathing (including washing, rinsing, drying)?: A Lot Help from another person to put on and taking off regular upper body clothing?: A Lot Help from another person to put on and taking off regular lower body clothing?: A Lot 6 Click Score: 14   End of Session Equipment Utilized During Treatment: Surveyor, mining Communication: Mobility status(RN and mobility tech assisting with transfer)  Activity Tolerance: Patient tolerated treatment well Patient left: in bed;with call bell/phone within reach;with family/visitor present;with nursing/sitter in room  OT Visit Diagnosis: Unsteadiness on feet (R26.81);Other abnormalities of gait and mobility (R26.89);Muscle weakness (generalized) (M62.81)                Time: 1165-7903 OT Time Calculation (min): 29 min Charges:  OT General Charges $OT Visit: 1 Visit OT Evaluation $OT Eval Moderate Complexity: 1 Mod OT Treatments $Self Care/Home Management : 8-22 mins G-Codes:     Michal Strzelecki A. Ulice Brilliant, M.S., OTR/L Pager: [place] 11/12/2017, 10:39 AM

## 2017-11-13 LAB — BASIC METABOLIC PANEL
Anion gap: 8 (ref 5–15)
BUN: 27 mg/dL — AB (ref 6–20)
CALCIUM: 8 mg/dL — AB (ref 8.9–10.3)
CO2: 27 mmol/L (ref 22–32)
CREATININE: 1.88 mg/dL — AB (ref 0.44–1.00)
Chloride: 103 mmol/L (ref 101–111)
GFR calc Af Amer: 35 mL/min — ABNORMAL LOW (ref >60)
GFR, EST NON AFRICAN AMERICAN: 30 mL/min — AB (ref >60)
Glucose, Bld: 79 mg/dL (ref 65–99)
Potassium: 4.7 mmol/L (ref 3.5–5.1)
SODIUM: 138 mmol/L (ref 135–145)

## 2017-11-13 LAB — GLUCOSE, CAPILLARY
GLUCOSE-CAPILLARY: 79 mg/dL (ref 65–99)
GLUCOSE-CAPILLARY: 97 mg/dL (ref 65–99)
GLUCOSE-CAPILLARY: 90 mg/dL (ref 65–99)

## 2017-11-13 MED ORDER — FUROSEMIDE 40 MG PO TABS
40.0000 mg | ORAL_TABLET | Freq: Every day | ORAL | Status: DC
  Administered 2017-11-13: 40 mg via ORAL
  Filled 2017-11-13: qty 1

## 2017-11-13 MED ORDER — FUROSEMIDE 40 MG PO TABS: 40.0000 mg | ORAL_TABLET | Freq: Every day | ORAL | 0 refills | Status: DC

## 2017-11-13 MED ORDER — LISINOPRIL 20 MG PO TABS: 20.0000 mg | ORAL_TABLET | Freq: Every day | ORAL | 0 refills | Status: DC

## 2017-11-13 NOTE — Care Management Important Message (Signed)
Important Message  Patient Details  Name: [name] MRN: [id number] Date of Birth: March 08, 1966   Medicare Important Message Given:  Yes  [phone]  Orbie Pyo 11/13/2017, 2:33 PM

## 2017-11-13 NOTE — Progress Notes (Signed)
Pt placed on Cpap tolerating well

## 2017-11-13 NOTE — Care Management Note (Addendum)
Case Management Note  Patient Details  Name: [name] MRN: [id number] Date of Birth: 1966/07/03  Action/Plan: CM talked to patient with her daughter at the bedside for [facility] choices, pt chose [facility]; [place] with [facility] called for arrangements; DME ordered, bariatric rollater, 3:1 through [employer]; B Dorthy Hustead RN,MHA,BSN  2:16 pm- CM talked to Dan with [facility] patient recently purchased a RW/ 3:1 and if she wanted another one she would have to privately pay; patient states that her walker is damaged; Dan with [facility] stated that he will talk to the patient about taking her walker to the [facility] store for them to give her another one. Trapeze bar ordered for her hospital bed. Patient stated that she and her daughter will go home via taxi cab at discharge; Aneta Mins [phone]  Epected Discharge Date:   possibly 11/13/2017               Expected Discharge Plan:  [place]  Discharge planning Services  CM Consult  Choice offered to:  Patient  DME Arranged:  Walker rolling with seat DME Agency:  [place] Arranged:  RN, Disease Management, PT, OT [place] Agency:  [facility] ([facility]  Status of Service:  In process, will continue to follow  Sherrilyn Rist [phone] 11/13/2017, 1:25 PM

## 2017-11-13 NOTE — Discharge Summary (Signed)
Physician Discharge Summary  [name] WLN:[id number] DOB: 05/01/1966 DOA: 11/10/2017  PCP: Benito Mccreedy, MD  Admit date: 11/10/2017 Discharge date: 11/13/2017  Time spent: > 35 minutes  Recommendations for Outpatient Follow-up:  Monitor serum creatinine Go over salt restriction with patient Reassess bmp upon f/u   Discharge Diagnoses:  Principal Problem:   Acute CHF (congestive heart failure) ([place]) Active Problems:   HYPERTENSION, BENIGN SYSTEMIC   Diabetes mellitus ([place])   Chronic renal insufficiency, stage 2 (mild)   Discharge Condition: stable  Diet recommendation: Heart healthy  Filed Weights   11/12/17 0428 11/12/17 1200 11/13/17 0537  Weight: (!) 207.7 kg (458 lb) (!) 198.4 kg (437 lb 4.8 oz) (!) 197.2 kg (434 lb 11.2 oz)    History of present illness:  52 y.o. female with medical history significant for insulin-dependent diabetes mellitus and hypertension, now presenting to the emergency department for evaluation of progressive fatigue, swelling, exertional dyspnea, and orthopnea  Hospital Course:  Acute CHF exacerbation - undetermined clinically if systolic or diastolic as outpatient echocardiogram performed earlier this month was not available. I suspect diastolic however - improved after diuresis - d/c on lasix  For other known medical problems listed above will continue prior to admission medication regimen listed below.   Procedures:  none  Consultations:  None  Discharge Exam: Vitals:   11/13/17 0537 11/13/17 1100  BP: 120/84 (!) 94/48  Pulse: 90 84  Resp:  18  Temp: 98.6 F (37 C)   SpO2: 93% 95%    General: Pt in nad, alert and awake Cardiovascular: rrr, no rubs Respiratory: no increased wob, no wheezes  Discharge Instructions   Discharge Instructions    Call MD for:  difficulty breathing, headache or visual disturbances   Complete by:  As directed    Call MD for:  temperature >100.4   Complete by:  As directed    Diet  - low sodium heart healthy   Complete by:  As directed    Discharge instructions   Complete by:  As directed    Please follow up with your primary care physician in 1-2 weeks or sooner should any new concerns arise.   Increase activity slowly   Complete by:  As directed      Allergies as of 11/13/2017      Reactions   Acetaminophen Rash   Upset stomach      Medication List    STOP taking these medications   sulindac 200 MG tablet Commonly known as:  CLINORIL     TAKE these medications   furosemide 40 MG tablet Commonly known as:  LASIX Take 1 tablet (40 mg total) by mouth daily. Start taking on:  11/14/2017   insulin glargine 100 UNIT/ML injection Commonly known as:  LANTUS Inject 60 Units into the skin at bedtime.   lisinopril 20 MG tablet Commonly known as:  PRINIVIL,ZESTRIL Take 1 tablet (20 mg total) by mouth daily. Start taking on:  11/16/2017 What changed:  These instructions start on 11/16/2017. If you are unsure what to do until then, ask your doctor or other care provider.   methadone 1 MG/1ML solution Commonly known as:  DOLOPHINE Take 115 mg by mouth every 8 (eight) hours.            Durable Medical Equipment  (From admission, onward)        Start     Ordered   11/13/17 1415  For home use only DME Other see comment  Once  Comments:  Trapeze bar for her hospital bed   11/13/17 1415   11/13/17 1324  For home use only DME Walker rolling  Once    Comments:  Bariatric Rollater  Question:  Patient needs a walker to treat with the following condition  Answer:  CHF (congestive heart failure) ([place])   11/13/17 1325   11/13/17 1324  For home use only DME 3 n 1  Once    Comments:  Bariatric 3:1   11/13/17 1325     Allergies  Allergen Reactions  . Acetaminophen Rash    Upset stomach      The results of significant diagnostics from this hospitalization (including imaging, microbiology, ancillary and laboratory) are listed below for reference.     Significant Diagnostic Studies: Dg Chest 2 View  Result Date: 11/10/2017 CLINICAL DATA:  Left chest pain radiating into the left arm. Shortness of breath since 11/06/2017. EXAM: CHEST  2 VIEW COMPARISON:  PA and lateral chest 09/27/2017 and 01/17/2016. FINDINGS: The exam is limited by the patient's weight. There is cardiomegaly and pulmonary vascular congestion. No consolidative process, pneumothorax or effusion. The patient is status post cervical fusion. IMPRESSION: Cardiomegaly without acute disease. Electronically Signed   By: Inge Rise M.D.   On: 11/10/2017 19:49    Microbiology: No results found for this or any previous visit (from the past 240 hour(s)).   Labs: Basic Metabolic Panel: Recent Labs  Lab 11/10/17 1822 11/11/17 0440 11/12/17 0452 11/13/17 0508  NA 136 138 138 138  K 4.3 4.3 4.5 4.7  CL 102 106 104 103  CO2 24 28 27 27   GLUCOSE 104* 96 80 79  BUN 16 18 22* 27*  CREATININE 1.19* 1.19* 1.72* 1.88*  CALCIUM 8.5* 8.2* 7.9* 8.0*   Liver Function Tests: Recent Labs  Lab 11/10/17 1834  AST 54*  ALT 32  ALKPHOS 95  BILITOT 4.5*  PROT 8.7*  ALBUMIN 2.5*   No results for input(s): LIPASE, AMYLASE in the last 168 hours. No results for input(s): AMMONIA in the last 168 hours. CBC: Recent Labs  Lab 11/10/17 1822  WBC 6.7  HGB 11.8*  HCT 38.2  MCV 90.5  PLT 200   Cardiac Enzymes: No results for input(s): CKTOTAL, CKMB, CKMBINDEX, TROPONINI in the last 168 hours. BNP: BNP (last 3 results) Recent Labs    11/10/17 1822  BNP 830.7*    ProBNP (last 3 results) No results for input(s): PROBNP in the last 8760 hours.  CBG: Recent Labs  Lab 11/12/17 1133 11/12/17 1656 11/12/17 2115 11/13/17 0742 11/13/17 1131  GLUCAP 99 83 85 79 97    Signed:  Velvet Bathe MD.  [facility] 11/13/2017, 3:37 PM

## 2017-11-13 NOTE — Progress Notes (Signed)
Inpatient Diabetes Program Recommendations  AACE/ADA: New Consensus Statement on Inpatient Glycemic Control (2015)  Target Ranges:  Prepandial:   less than 140 mg/dL      Peak postprandial:   less than 180 mg/dL (1-2 hours)      Critically ill patients:  140 - 180 mg/dL   Lab Results  Component Value Date   GLUCAP 79 11/13/2017   HGBA1C 11.3 (H) 01/03/2013    Review of Glycemic Control Results for CALY, PELLUM (MRN [id number]) as of 11/13/2017 10:56  Ref. Range 11/12/2017 11:33 11/12/2017 16:56 11/12/2017 21:15 11/13/2017 07:42  Glucose-Capillary Latest Ref Range: 65 - 99 mg/dL 99 83 85 79   Diabetes history: Type 2 DM Outpatient Diabetes medications: Lantus 60 Units QHS Current orders for Inpatient glycemic control: Lantus 30 Units QD, Novolog 0-5 Units QHS, Novolog 0-9 Units  Inpatient Diabetes Program Recommendations:    Last A1C from 2014, please consider adding on A1C to determine education and outpatient needs.   Am FSBS was on the lower side of normal at 79mg /dL, could consider decreasing Lantus to 25 Units QHS and adjusting as necessary.  Thanks, Bronson Curb, MSN, RNC-OB Diabetes Coordinator [phone] (8a-5p)

## 2017-11-13 NOTE — Clinical Social Work Note (Signed)
Clinical Social Work Assessment  Patient Details  Name: [name] MRN: [id number] Date of Birth: Feb 05, 1966  Date of referral:  11/13/17               Reason for consult:  Facility Placement, Discharge Planning                Permission sought to share information with:  Family Supports Permission granted to share information::  Yes, Verbal Permission Granted  Name::        Agency::     Relationship::  Friend/Aide and daughter at bedside  Contact Information:     Housing/Transportation Living arrangements for the past 2 months:  Single Family Home Source of Information:  Patient, Medical Team, Friend/Neighbor Patient Interpreter Needed:  None Criminal Activity/Legal Involvement Pertinent to Current Situation/Hospitalization:  No - Comment as needed Significant Relationships:  Adult Children, Friend Lives with:  Adult Children Do you feel safe going back to the place where you live?  Yes Need for family participation in patient care:  Yes (Comment)  Care giving concerns:  PT recommending SNF once medically stable for discharge.   Social Worker assessment / plan:  CSW met with patient. Friend/aide and daughter at bedside. CSW introduced role and explained that PT recommendations would be discussed. Patient prefers to return home with home health. She currently has her aide through [facility]. CSW notified RNCM and provided list of requested equipment. No further concerns. CSW signing off as social work intervention is no longer needed.  Employment status:  Disabled (Comment on whether or not currently receiving Disability) Insurance information:  Managed Medicare PT Recommendations:  [place] / Referral to community resources:  [place]  Patient/Family's Response to care:  Patient prefers to return home at discharge. Patient's family and friend supportive and involved in patient's care. Patient appreciated social work  intervention.  Patient/Family's Understanding of and Emotional Response to Diagnosis, Current Treatment, and Prognosis:  Patient has a good understanding of the reason for admission and her need for therapy once she returns home. Patient appears happy with hospital care.  Emotional Assessment Appearance:  Appears stated age Attitude/Demeanor/Rapport:  Engaged, Gracious Affect (typically observed):  Appropriate, Calm, Pleasant Orientation:  Oriented to Self, Oriented to Place, Oriented to  Time, Oriented to Situation Alcohol / Substance use:  Tobacco Use Psych involvement (Current and /or in the community):  No (Comment)  Discharge Needs  Concerns to be addressed:  Care Coordination Readmission within the last 30 days:  No Current discharge risk:  Dependent with Mobility Barriers to Discharge:  Continued Medical Work up   Candie Chroman, LCSW 11/13/2017, 1:36 PM

## 2017-11-14 DIAGNOSIS — G4733 Obstructive sleep apnea (adult) (pediatric): Secondary | ICD-10-CM | POA: Diagnosis not present

## 2017-11-14 DIAGNOSIS — Z794 Long term (current) use of insulin: Secondary | ICD-10-CM | POA: Diagnosis not present

## 2017-11-14 DIAGNOSIS — L89322 Pressure ulcer of left buttock, stage 2: Secondary | ICD-10-CM | POA: Diagnosis not present

## 2017-11-14 DIAGNOSIS — Z96653 Presence of artificial knee joint, bilateral: Secondary | ICD-10-CM | POA: Diagnosis not present

## 2017-11-14 DIAGNOSIS — N182 Chronic kidney disease, stage 2 (mild): Secondary | ICD-10-CM | POA: Diagnosis not present

## 2017-11-14 DIAGNOSIS — Z9181 History of falling: Secondary | ICD-10-CM | POA: Diagnosis not present

## 2017-11-14 DIAGNOSIS — I130 Hypertensive heart and chronic kidney disease with heart failure and stage 1 through stage 4 chronic kidney disease, or unspecified chronic kidney disease: Secondary | ICD-10-CM | POA: Diagnosis not present

## 2017-11-14 DIAGNOSIS — E1140 Type 2 diabetes mellitus with diabetic neuropathy, unspecified: Secondary | ICD-10-CM | POA: Diagnosis not present

## 2017-11-14 DIAGNOSIS — I509 Heart failure, unspecified: Secondary | ICD-10-CM | POA: Diagnosis not present

## 2017-11-14 DIAGNOSIS — E1122 Type 2 diabetes mellitus with diabetic chronic kidney disease: Secondary | ICD-10-CM | POA: Diagnosis not present

## 2017-11-17 DIAGNOSIS — G802 Spastic hemiplegic cerebral palsy: Secondary | ICD-10-CM | POA: Diagnosis not present

## 2017-11-17 DIAGNOSIS — G894 Chronic pain syndrome: Secondary | ICD-10-CM | POA: Diagnosis not present

## 2017-11-20 DIAGNOSIS — E559 Vitamin D deficiency, unspecified: Secondary | ICD-10-CM | POA: Diagnosis not present

## 2017-11-20 DIAGNOSIS — I10 Essential (primary) hypertension: Secondary | ICD-10-CM | POA: Diagnosis not present

## 2017-11-20 DIAGNOSIS — E1143 Type 2 diabetes mellitus with diabetic autonomic (poly)neuropathy: Secondary | ICD-10-CM | POA: Diagnosis not present

## 2017-11-23 DIAGNOSIS — Z9181 History of falling: Secondary | ICD-10-CM | POA: Diagnosis not present

## 2017-11-23 DIAGNOSIS — G4733 Obstructive sleep apnea (adult) (pediatric): Secondary | ICD-10-CM | POA: Diagnosis not present

## 2017-11-23 DIAGNOSIS — N182 Chronic kidney disease, stage 2 (mild): Secondary | ICD-10-CM | POA: Diagnosis not present

## 2017-11-23 DIAGNOSIS — I509 Heart failure, unspecified: Secondary | ICD-10-CM | POA: Diagnosis not present

## 2017-11-23 DIAGNOSIS — E1140 Type 2 diabetes mellitus with diabetic neuropathy, unspecified: Secondary | ICD-10-CM | POA: Diagnosis not present

## 2017-11-23 DIAGNOSIS — I130 Hypertensive heart and chronic kidney disease with heart failure and stage 1 through stage 4 chronic kidney disease, or unspecified chronic kidney disease: Secondary | ICD-10-CM | POA: Diagnosis not present

## 2017-11-23 DIAGNOSIS — Z794 Long term (current) use of insulin: Secondary | ICD-10-CM | POA: Diagnosis not present

## 2017-11-23 DIAGNOSIS — L89322 Pressure ulcer of left buttock, stage 2: Secondary | ICD-10-CM | POA: Diagnosis not present

## 2017-11-23 DIAGNOSIS — Z96653 Presence of artificial knee joint, bilateral: Secondary | ICD-10-CM | POA: Diagnosis not present

## 2017-11-23 DIAGNOSIS — E1122 Type 2 diabetes mellitus with diabetic chronic kidney disease: Secondary | ICD-10-CM | POA: Diagnosis not present

## 2017-11-30 DIAGNOSIS — G802 Spastic hemiplegic cerebral palsy: Secondary | ICD-10-CM | POA: Diagnosis not present

## 2017-12-14 DIAGNOSIS — Z01818 Encounter for other preprocedural examination: Secondary | ICD-10-CM | POA: Diagnosis not present

## 2017-12-14 DIAGNOSIS — I10 Essential (primary) hypertension: Secondary | ICD-10-CM | POA: Diagnosis not present

## 2017-12-14 DIAGNOSIS — Z720 Tobacco use: Secondary | ICD-10-CM | POA: Diagnosis not present

## 2017-12-14 DIAGNOSIS — I5020 Unspecified systolic (congestive) heart failure: Secondary | ICD-10-CM | POA: Diagnosis not present

## 2017-12-18 DIAGNOSIS — G802 Spastic hemiplegic cerebral palsy: Secondary | ICD-10-CM | POA: Diagnosis not present

## 2017-12-18 DIAGNOSIS — G894 Chronic pain syndrome: Secondary | ICD-10-CM | POA: Diagnosis not present

## 2017-12-25 DIAGNOSIS — I130 Hypertensive heart and chronic kidney disease with heart failure and stage 1 through stage 4 chronic kidney disease, or unspecified chronic kidney disease: Secondary | ICD-10-CM | POA: Diagnosis not present

## 2017-12-25 DIAGNOSIS — N182 Chronic kidney disease, stage 2 (mild): Secondary | ICD-10-CM | POA: Diagnosis not present

## 2017-12-25 DIAGNOSIS — Z9181 History of falling: Secondary | ICD-10-CM | POA: Diagnosis not present

## 2017-12-25 DIAGNOSIS — Z96653 Presence of artificial knee joint, bilateral: Secondary | ICD-10-CM | POA: Diagnosis not present

## 2017-12-25 DIAGNOSIS — I509 Heart failure, unspecified: Secondary | ICD-10-CM | POA: Diagnosis not present

## 2017-12-25 DIAGNOSIS — L89322 Pressure ulcer of left buttock, stage 2: Secondary | ICD-10-CM | POA: Diagnosis not present

## 2017-12-25 DIAGNOSIS — E1122 Type 2 diabetes mellitus with diabetic chronic kidney disease: Secondary | ICD-10-CM | POA: Diagnosis not present

## 2017-12-25 DIAGNOSIS — E1140 Type 2 diabetes mellitus with diabetic neuropathy, unspecified: Secondary | ICD-10-CM | POA: Diagnosis not present

## 2017-12-25 DIAGNOSIS — G4733 Obstructive sleep apnea (adult) (pediatric): Secondary | ICD-10-CM | POA: Diagnosis not present

## 2017-12-25 DIAGNOSIS — Z794 Long term (current) use of insulin: Secondary | ICD-10-CM | POA: Diagnosis not present

## 2017-12-30 ENCOUNTER — Other Ambulatory Visit: Payer: Self-pay | Admitting: Cardiology

## 2017-12-30 ENCOUNTER — Other Ambulatory Visit (HOSPITAL_COMMUNITY): Payer: Self-pay | Admitting: Cardiology

## 2017-12-30 DIAGNOSIS — I5020 Unspecified systolic (congestive) heart failure: Secondary | ICD-10-CM

## 2017-12-30 DIAGNOSIS — Z01818 Encounter for other preprocedural examination: Secondary | ICD-10-CM

## 2017-12-31 ENCOUNTER — Other Ambulatory Visit (HOSPITAL_COMMUNITY): Payer: Self-pay | Admitting: Cardiology

## 2017-12-31 DIAGNOSIS — I159 Secondary hypertension, unspecified: Secondary | ICD-10-CM

## 2017-12-31 DIAGNOSIS — G802 Spastic hemiplegic cerebral palsy: Secondary | ICD-10-CM | POA: Diagnosis not present

## 2017-12-31 DIAGNOSIS — I509 Heart failure, unspecified: Secondary | ICD-10-CM

## 2017-12-31 DIAGNOSIS — Z01818 Encounter for other preprocedural examination: Secondary | ICD-10-CM

## 2018-01-01 DIAGNOSIS — I130 Hypertensive heart and chronic kidney disease with heart failure and stage 1 through stage 4 chronic kidney disease, or unspecified chronic kidney disease: Secondary | ICD-10-CM | POA: Diagnosis not present

## 2018-01-01 DIAGNOSIS — N182 Chronic kidney disease, stage 2 (mild): Secondary | ICD-10-CM | POA: Diagnosis not present

## 2018-01-01 DIAGNOSIS — L89322 Pressure ulcer of left buttock, stage 2: Secondary | ICD-10-CM | POA: Diagnosis not present

## 2018-01-01 DIAGNOSIS — Z9181 History of falling: Secondary | ICD-10-CM | POA: Diagnosis not present

## 2018-01-01 DIAGNOSIS — Z96653 Presence of artificial knee joint, bilateral: Secondary | ICD-10-CM | POA: Diagnosis not present

## 2018-01-01 DIAGNOSIS — I509 Heart failure, unspecified: Secondary | ICD-10-CM | POA: Diagnosis not present

## 2018-01-01 DIAGNOSIS — G4733 Obstructive sleep apnea (adult) (pediatric): Secondary | ICD-10-CM | POA: Diagnosis not present

## 2018-01-01 DIAGNOSIS — E1122 Type 2 diabetes mellitus with diabetic chronic kidney disease: Secondary | ICD-10-CM | POA: Diagnosis not present

## 2018-01-01 DIAGNOSIS — Z794 Long term (current) use of insulin: Secondary | ICD-10-CM | POA: Diagnosis not present

## 2018-01-01 DIAGNOSIS — E1140 Type 2 diabetes mellitus with diabetic neuropathy, unspecified: Secondary | ICD-10-CM | POA: Diagnosis not present

## 2018-01-07 ENCOUNTER — Ambulatory Visit (HOSPITAL_COMMUNITY)
Admission: RE | Admit: 2018-01-07 | Discharge: 2018-01-07 | Disposition: A | Discharge status: Home / Self Care | Payer: Medicare Other | Source: Ambulatory Visit | Attending: Cardiology | Admitting: Cardiology

## 2018-01-07 DIAGNOSIS — L89322 Pressure ulcer of left buttock, stage 2: Secondary | ICD-10-CM | POA: Diagnosis not present

## 2018-01-07 DIAGNOSIS — Z9181 History of falling: Secondary | ICD-10-CM | POA: Diagnosis not present

## 2018-01-07 DIAGNOSIS — I509 Heart failure, unspecified: Secondary | ICD-10-CM | POA: Diagnosis not present

## 2018-01-07 DIAGNOSIS — R9439 Abnormal result of other cardiovascular function study: Secondary | ICD-10-CM | POA: Insufficient documentation

## 2018-01-07 DIAGNOSIS — Z96653 Presence of artificial knee joint, bilateral: Secondary | ICD-10-CM | POA: Diagnosis not present

## 2018-01-07 DIAGNOSIS — I159 Secondary hypertension, unspecified: Secondary | ICD-10-CM | POA: Insufficient documentation

## 2018-01-07 DIAGNOSIS — Z01818 Encounter for other preprocedural examination: Secondary | ICD-10-CM | POA: Insufficient documentation

## 2018-01-07 DIAGNOSIS — I110 Hypertensive heart disease with heart failure: Secondary | ICD-10-CM | POA: Insufficient documentation

## 2018-01-07 DIAGNOSIS — E1122 Type 2 diabetes mellitus with diabetic chronic kidney disease: Secondary | ICD-10-CM | POA: Diagnosis not present

## 2018-01-07 DIAGNOSIS — I2510 Atherosclerotic heart disease of native coronary artery without angina pectoris: Secondary | ICD-10-CM | POA: Diagnosis not present

## 2018-01-07 DIAGNOSIS — N182 Chronic kidney disease, stage 2 (mild): Secondary | ICD-10-CM | POA: Diagnosis not present

## 2018-01-07 DIAGNOSIS — E1140 Type 2 diabetes mellitus with diabetic neuropathy, unspecified: Secondary | ICD-10-CM | POA: Diagnosis not present

## 2018-01-07 DIAGNOSIS — I130 Hypertensive heart and chronic kidney disease with heart failure and stage 1 through stage 4 chronic kidney disease, or unspecified chronic kidney disease: Secondary | ICD-10-CM | POA: Diagnosis not present

## 2018-01-07 DIAGNOSIS — G4733 Obstructive sleep apnea (adult) (pediatric): Secondary | ICD-10-CM | POA: Diagnosis not present

## 2018-01-07 DIAGNOSIS — Z794 Long term (current) use of insulin: Secondary | ICD-10-CM | POA: Diagnosis not present

## 2018-01-07 MED ORDER — REGADENOSON 0.4 MG/5ML IV SOLN
INTRAVENOUS | Status: AC
  Administered 2018-01-07: 0.4 mg
  Filled 2018-01-07: qty 5

## 2018-01-07 MED ORDER — TECHNETIUM TC 99M TETROFOSMIN IV KIT
30.0000 | PACK | Freq: Once | INTRAVENOUS | Status: AC | PRN
  Administered 2018-01-07: 30 via INTRAVENOUS

## 2018-01-08 ENCOUNTER — Encounter (HOSPITAL_COMMUNITY)
Admission: RE | Admit: 2018-01-08 | Discharge: 2018-01-08 | Disposition: A | Discharge status: Home / Self Care | Payer: Medicare Other | Source: Ambulatory Visit | Attending: Cardiology | Admitting: Cardiology

## 2018-01-08 DIAGNOSIS — Z96653 Presence of artificial knee joint, bilateral: Secondary | ICD-10-CM | POA: Diagnosis not present

## 2018-01-08 DIAGNOSIS — L89322 Pressure ulcer of left buttock, stage 2: Secondary | ICD-10-CM | POA: Diagnosis not present

## 2018-01-08 DIAGNOSIS — G4733 Obstructive sleep apnea (adult) (pediatric): Secondary | ICD-10-CM | POA: Diagnosis not present

## 2018-01-08 DIAGNOSIS — Z794 Long term (current) use of insulin: Secondary | ICD-10-CM | POA: Diagnosis not present

## 2018-01-08 DIAGNOSIS — I130 Hypertensive heart and chronic kidney disease with heart failure and stage 1 through stage 4 chronic kidney disease, or unspecified chronic kidney disease: Secondary | ICD-10-CM | POA: Diagnosis not present

## 2018-01-08 DIAGNOSIS — I509 Heart failure, unspecified: Secondary | ICD-10-CM | POA: Diagnosis not present

## 2018-01-08 DIAGNOSIS — Z9181 History of falling: Secondary | ICD-10-CM | POA: Diagnosis not present

## 2018-01-08 DIAGNOSIS — N182 Chronic kidney disease, stage 2 (mild): Secondary | ICD-10-CM | POA: Diagnosis not present

## 2018-01-08 DIAGNOSIS — E1140 Type 2 diabetes mellitus with diabetic neuropathy, unspecified: Secondary | ICD-10-CM | POA: Diagnosis not present

## 2018-01-08 DIAGNOSIS — E1122 Type 2 diabetes mellitus with diabetic chronic kidney disease: Secondary | ICD-10-CM | POA: Diagnosis not present

## 2018-01-11 DIAGNOSIS — Z87891 Personal history of nicotine dependence: Secondary | ICD-10-CM | POA: Diagnosis not present

## 2018-01-11 DIAGNOSIS — Z9181 History of falling: Secondary | ICD-10-CM | POA: Diagnosis not present

## 2018-01-11 DIAGNOSIS — T8484XD Pain due to internal orthopedic prosthetic devices, implants and grafts, subsequent encounter: Secondary | ICD-10-CM | POA: Diagnosis not present

## 2018-01-11 DIAGNOSIS — Z96652 Presence of left artificial knee joint: Secondary | ICD-10-CM | POA: Diagnosis not present

## 2018-01-11 DIAGNOSIS — Z794 Long term (current) use of insulin: Secondary | ICD-10-CM | POA: Diagnosis not present

## 2018-01-11 DIAGNOSIS — I130 Hypertensive heart and chronic kidney disease with heart failure and stage 1 through stage 4 chronic kidney disease, or unspecified chronic kidney disease: Secondary | ICD-10-CM | POA: Diagnosis not present

## 2018-01-11 DIAGNOSIS — E1122 Type 2 diabetes mellitus with diabetic chronic kidney disease: Secondary | ICD-10-CM | POA: Diagnosis not present

## 2018-01-11 DIAGNOSIS — G4733 Obstructive sleep apnea (adult) (pediatric): Secondary | ICD-10-CM | POA: Diagnosis not present

## 2018-01-11 DIAGNOSIS — I509 Heart failure, unspecified: Secondary | ICD-10-CM | POA: Diagnosis not present

## 2018-01-11 DIAGNOSIS — E1140 Type 2 diabetes mellitus with diabetic neuropathy, unspecified: Secondary | ICD-10-CM | POA: Diagnosis not present

## 2018-01-11 DIAGNOSIS — N182 Chronic kidney disease, stage 2 (mild): Secondary | ICD-10-CM | POA: Diagnosis not present

## 2018-01-13 ENCOUNTER — Ambulatory Visit: Payer: Medicare Other | Admitting: Skilled Nursing Facility1

## 2018-01-13 DIAGNOSIS — I5020 Unspecified systolic (congestive) heart failure: Secondary | ICD-10-CM | POA: Diagnosis not present

## 2018-01-13 DIAGNOSIS — Z01818 Encounter for other preprocedural examination: Secondary | ICD-10-CM | POA: Diagnosis not present

## 2018-01-13 DIAGNOSIS — Z720 Tobacco use: Secondary | ICD-10-CM | POA: Diagnosis not present

## 2018-01-13 DIAGNOSIS — I10 Essential (primary) hypertension: Secondary | ICD-10-CM | POA: Diagnosis not present

## 2018-01-15 DIAGNOSIS — Z96652 Presence of left artificial knee joint: Secondary | ICD-10-CM | POA: Diagnosis not present

## 2018-01-15 DIAGNOSIS — Z794 Long term (current) use of insulin: Secondary | ICD-10-CM | POA: Diagnosis not present

## 2018-01-15 DIAGNOSIS — N182 Chronic kidney disease, stage 2 (mild): Secondary | ICD-10-CM | POA: Diagnosis not present

## 2018-01-15 DIAGNOSIS — T8484XD Pain due to internal orthopedic prosthetic devices, implants and grafts, subsequent encounter: Secondary | ICD-10-CM | POA: Diagnosis not present

## 2018-01-15 DIAGNOSIS — Z87891 Personal history of nicotine dependence: Secondary | ICD-10-CM | POA: Diagnosis not present

## 2018-01-15 DIAGNOSIS — I509 Heart failure, unspecified: Secondary | ICD-10-CM | POA: Diagnosis not present

## 2018-01-15 DIAGNOSIS — G4733 Obstructive sleep apnea (adult) (pediatric): Secondary | ICD-10-CM | POA: Diagnosis not present

## 2018-01-15 DIAGNOSIS — Z9181 History of falling: Secondary | ICD-10-CM | POA: Diagnosis not present

## 2018-01-15 DIAGNOSIS — E1122 Type 2 diabetes mellitus with diabetic chronic kidney disease: Secondary | ICD-10-CM | POA: Diagnosis not present

## 2018-01-15 DIAGNOSIS — I130 Hypertensive heart and chronic kidney disease with heart failure and stage 1 through stage 4 chronic kidney disease, or unspecified chronic kidney disease: Secondary | ICD-10-CM | POA: Diagnosis not present

## 2018-01-15 DIAGNOSIS — E1140 Type 2 diabetes mellitus with diabetic neuropathy, unspecified: Secondary | ICD-10-CM | POA: Diagnosis not present

## 2018-01-17 DIAGNOSIS — G802 Spastic hemiplegic cerebral palsy: Secondary | ICD-10-CM | POA: Diagnosis not present

## 2018-01-17 DIAGNOSIS — G894 Chronic pain syndrome: Secondary | ICD-10-CM | POA: Diagnosis not present

## 2018-01-19 DIAGNOSIS — I130 Hypertensive heart and chronic kidney disease with heart failure and stage 1 through stage 4 chronic kidney disease, or unspecified chronic kidney disease: Secondary | ICD-10-CM | POA: Diagnosis not present

## 2018-01-19 DIAGNOSIS — Z794 Long term (current) use of insulin: Secondary | ICD-10-CM | POA: Diagnosis not present

## 2018-01-19 DIAGNOSIS — E1140 Type 2 diabetes mellitus with diabetic neuropathy, unspecified: Secondary | ICD-10-CM | POA: Diagnosis not present

## 2018-01-19 DIAGNOSIS — N182 Chronic kidney disease, stage 2 (mild): Secondary | ICD-10-CM | POA: Diagnosis not present

## 2018-01-19 DIAGNOSIS — G4733 Obstructive sleep apnea (adult) (pediatric): Secondary | ICD-10-CM | POA: Diagnosis not present

## 2018-01-19 DIAGNOSIS — T8484XD Pain due to internal orthopedic prosthetic devices, implants and grafts, subsequent encounter: Secondary | ICD-10-CM | POA: Diagnosis not present

## 2018-01-19 DIAGNOSIS — I509 Heart failure, unspecified: Secondary | ICD-10-CM | POA: Diagnosis not present

## 2018-01-19 DIAGNOSIS — E1122 Type 2 diabetes mellitus with diabetic chronic kidney disease: Secondary | ICD-10-CM | POA: Diagnosis not present

## 2018-01-19 DIAGNOSIS — Z9181 History of falling: Secondary | ICD-10-CM | POA: Diagnosis not present

## 2018-01-19 DIAGNOSIS — Z96652 Presence of left artificial knee joint: Secondary | ICD-10-CM | POA: Diagnosis not present

## 2018-01-19 DIAGNOSIS — Z87891 Personal history of nicotine dependence: Secondary | ICD-10-CM | POA: Diagnosis not present

## 2018-01-20 DIAGNOSIS — I130 Hypertensive heart and chronic kidney disease with heart failure and stage 1 through stage 4 chronic kidney disease, or unspecified chronic kidney disease: Secondary | ICD-10-CM | POA: Diagnosis not present

## 2018-01-20 DIAGNOSIS — Z96652 Presence of left artificial knee joint: Secondary | ICD-10-CM | POA: Diagnosis not present

## 2018-01-20 DIAGNOSIS — Z79899 Other long term (current) drug therapy: Secondary | ICD-10-CM | POA: Diagnosis not present

## 2018-01-20 DIAGNOSIS — E1140 Type 2 diabetes mellitus with diabetic neuropathy, unspecified: Secondary | ICD-10-CM | POA: Diagnosis not present

## 2018-01-20 DIAGNOSIS — G4733 Obstructive sleep apnea (adult) (pediatric): Secondary | ICD-10-CM | POA: Diagnosis not present

## 2018-01-20 DIAGNOSIS — T8484XD Pain due to internal orthopedic prosthetic devices, implants and grafts, subsequent encounter: Secondary | ICD-10-CM | POA: Diagnosis not present

## 2018-01-20 DIAGNOSIS — Z87891 Personal history of nicotine dependence: Secondary | ICD-10-CM | POA: Diagnosis not present

## 2018-01-20 DIAGNOSIS — N182 Chronic kidney disease, stage 2 (mild): Secondary | ICD-10-CM | POA: Diagnosis not present

## 2018-01-20 DIAGNOSIS — E1040 Type 1 diabetes mellitus with diabetic neuropathy, unspecified: Secondary | ICD-10-CM | POA: Diagnosis not present

## 2018-01-20 DIAGNOSIS — Z794 Long term (current) use of insulin: Secondary | ICD-10-CM | POA: Diagnosis not present

## 2018-01-20 DIAGNOSIS — I10 Essential (primary) hypertension: Secondary | ICD-10-CM | POA: Diagnosis not present

## 2018-01-20 DIAGNOSIS — E1122 Type 2 diabetes mellitus with diabetic chronic kidney disease: Secondary | ICD-10-CM | POA: Diagnosis not present

## 2018-01-20 DIAGNOSIS — I509 Heart failure, unspecified: Secondary | ICD-10-CM | POA: Diagnosis not present

## 2018-01-20 DIAGNOSIS — Z9181 History of falling: Secondary | ICD-10-CM | POA: Diagnosis not present

## 2018-01-25 DIAGNOSIS — Z87891 Personal history of nicotine dependence: Secondary | ICD-10-CM | POA: Diagnosis not present

## 2018-01-25 DIAGNOSIS — I509 Heart failure, unspecified: Secondary | ICD-10-CM | POA: Diagnosis not present

## 2018-01-25 DIAGNOSIS — Z9181 History of falling: Secondary | ICD-10-CM | POA: Diagnosis not present

## 2018-01-25 DIAGNOSIS — I130 Hypertensive heart and chronic kidney disease with heart failure and stage 1 through stage 4 chronic kidney disease, or unspecified chronic kidney disease: Secondary | ICD-10-CM | POA: Diagnosis not present

## 2018-01-25 DIAGNOSIS — G4733 Obstructive sleep apnea (adult) (pediatric): Secondary | ICD-10-CM | POA: Diagnosis not present

## 2018-01-25 DIAGNOSIS — Z794 Long term (current) use of insulin: Secondary | ICD-10-CM | POA: Diagnosis not present

## 2018-01-25 DIAGNOSIS — T8484XD Pain due to internal orthopedic prosthetic devices, implants and grafts, subsequent encounter: Secondary | ICD-10-CM | POA: Diagnosis not present

## 2018-01-25 DIAGNOSIS — N182 Chronic kidney disease, stage 2 (mild): Secondary | ICD-10-CM | POA: Diagnosis not present

## 2018-01-25 DIAGNOSIS — E1140 Type 2 diabetes mellitus with diabetic neuropathy, unspecified: Secondary | ICD-10-CM | POA: Diagnosis not present

## 2018-01-25 DIAGNOSIS — E1122 Type 2 diabetes mellitus with diabetic chronic kidney disease: Secondary | ICD-10-CM | POA: Diagnosis not present

## 2018-01-25 DIAGNOSIS — Z96652 Presence of left artificial knee joint: Secondary | ICD-10-CM | POA: Diagnosis not present

## 2018-01-27 DIAGNOSIS — E1140 Type 2 diabetes mellitus with diabetic neuropathy, unspecified: Secondary | ICD-10-CM | POA: Diagnosis not present

## 2018-01-27 DIAGNOSIS — G4733 Obstructive sleep apnea (adult) (pediatric): Secondary | ICD-10-CM | POA: Diagnosis not present

## 2018-01-27 DIAGNOSIS — I130 Hypertensive heart and chronic kidney disease with heart failure and stage 1 through stage 4 chronic kidney disease, or unspecified chronic kidney disease: Secondary | ICD-10-CM | POA: Diagnosis not present

## 2018-01-27 DIAGNOSIS — E1122 Type 2 diabetes mellitus with diabetic chronic kidney disease: Secondary | ICD-10-CM | POA: Diagnosis not present

## 2018-01-27 DIAGNOSIS — Z9181 History of falling: Secondary | ICD-10-CM | POA: Diagnosis not present

## 2018-01-27 DIAGNOSIS — Z87891 Personal history of nicotine dependence: Secondary | ICD-10-CM | POA: Diagnosis not present

## 2018-01-27 DIAGNOSIS — T8484XD Pain due to internal orthopedic prosthetic devices, implants and grafts, subsequent encounter: Secondary | ICD-10-CM | POA: Diagnosis not present

## 2018-01-27 DIAGNOSIS — Z96652 Presence of left artificial knee joint: Secondary | ICD-10-CM | POA: Diagnosis not present

## 2018-01-27 DIAGNOSIS — Z794 Long term (current) use of insulin: Secondary | ICD-10-CM | POA: Diagnosis not present

## 2018-01-27 DIAGNOSIS — N182 Chronic kidney disease, stage 2 (mild): Secondary | ICD-10-CM | POA: Diagnosis not present

## 2018-01-27 DIAGNOSIS — I509 Heart failure, unspecified: Secondary | ICD-10-CM | POA: Diagnosis not present

## 2018-01-29 DIAGNOSIS — I10 Essential (primary) hypertension: Secondary | ICD-10-CM | POA: Diagnosis not present

## 2018-01-29 DIAGNOSIS — I5020 Unspecified systolic (congestive) heart failure: Secondary | ICD-10-CM | POA: Diagnosis not present

## 2018-01-29 DIAGNOSIS — Z01818 Encounter for other preprocedural examination: Secondary | ICD-10-CM | POA: Diagnosis not present

## 2018-01-29 DIAGNOSIS — I428 Other cardiomyopathies: Secondary | ICD-10-CM | POA: Diagnosis not present

## 2018-01-30 DIAGNOSIS — G802 Spastic hemiplegic cerebral palsy: Secondary | ICD-10-CM | POA: Diagnosis not present

## 2018-02-01 DIAGNOSIS — Z96652 Presence of left artificial knee joint: Secondary | ICD-10-CM | POA: Diagnosis not present

## 2018-02-01 DIAGNOSIS — T8484XD Pain due to internal orthopedic prosthetic devices, implants and grafts, subsequent encounter: Secondary | ICD-10-CM | POA: Diagnosis not present

## 2018-02-01 DIAGNOSIS — Z9181 History of falling: Secondary | ICD-10-CM | POA: Diagnosis not present

## 2018-02-01 DIAGNOSIS — Z794 Long term (current) use of insulin: Secondary | ICD-10-CM | POA: Diagnosis not present

## 2018-02-01 DIAGNOSIS — G4733 Obstructive sleep apnea (adult) (pediatric): Secondary | ICD-10-CM | POA: Diagnosis not present

## 2018-02-01 DIAGNOSIS — E1140 Type 2 diabetes mellitus with diabetic neuropathy, unspecified: Secondary | ICD-10-CM | POA: Diagnosis not present

## 2018-02-01 DIAGNOSIS — Z87891 Personal history of nicotine dependence: Secondary | ICD-10-CM | POA: Diagnosis not present

## 2018-02-01 DIAGNOSIS — E1122 Type 2 diabetes mellitus with diabetic chronic kidney disease: Secondary | ICD-10-CM | POA: Diagnosis not present

## 2018-02-01 DIAGNOSIS — I509 Heart failure, unspecified: Secondary | ICD-10-CM | POA: Diagnosis not present

## 2018-02-01 DIAGNOSIS — I130 Hypertensive heart and chronic kidney disease with heart failure and stage 1 through stage 4 chronic kidney disease, or unspecified chronic kidney disease: Secondary | ICD-10-CM | POA: Diagnosis not present

## 2018-02-01 DIAGNOSIS — N182 Chronic kidney disease, stage 2 (mild): Secondary | ICD-10-CM | POA: Diagnosis not present

## 2018-02-02 DIAGNOSIS — T8484XA Pain due to internal orthopedic prosthetic devices, implants and grafts, initial encounter: Secondary | ICD-10-CM | POA: Diagnosis not present

## 2018-02-02 DIAGNOSIS — Z96653 Presence of artificial knee joint, bilateral: Secondary | ICD-10-CM | POA: Diagnosis not present

## 2018-02-02 DIAGNOSIS — Z96652 Presence of left artificial knee joint: Secondary | ICD-10-CM | POA: Diagnosis not present

## 2018-02-02 DIAGNOSIS — Z96651 Presence of right artificial knee joint: Secondary | ICD-10-CM | POA: Diagnosis not present

## 2018-02-02 DIAGNOSIS — T84018A Broken internal joint prosthesis, other site, initial encounter: Secondary | ICD-10-CM | POA: Diagnosis not present

## 2018-02-03 ENCOUNTER — Other Ambulatory Visit (INDEPENDENT_AMBULATORY_CARE_PROVIDER_SITE_OTHER): Payer: Self-pay | Admitting: Orthopedic Surgery

## 2018-02-03 DIAGNOSIS — E1122 Type 2 diabetes mellitus with diabetic chronic kidney disease: Secondary | ICD-10-CM | POA: Diagnosis not present

## 2018-02-03 DIAGNOSIS — T8484XD Pain due to internal orthopedic prosthetic devices, implants and grafts, subsequent encounter: Secondary | ICD-10-CM | POA: Diagnosis not present

## 2018-02-03 DIAGNOSIS — Z87891 Personal history of nicotine dependence: Secondary | ICD-10-CM | POA: Diagnosis not present

## 2018-02-03 DIAGNOSIS — Z9181 History of falling: Secondary | ICD-10-CM | POA: Diagnosis not present

## 2018-02-03 DIAGNOSIS — Z794 Long term (current) use of insulin: Secondary | ICD-10-CM | POA: Diagnosis not present

## 2018-02-03 DIAGNOSIS — Z96652 Presence of left artificial knee joint: Secondary | ICD-10-CM | POA: Diagnosis not present

## 2018-02-03 DIAGNOSIS — I509 Heart failure, unspecified: Secondary | ICD-10-CM | POA: Diagnosis not present

## 2018-02-03 DIAGNOSIS — N182 Chronic kidney disease, stage 2 (mild): Secondary | ICD-10-CM | POA: Diagnosis not present

## 2018-02-03 DIAGNOSIS — E1140 Type 2 diabetes mellitus with diabetic neuropathy, unspecified: Secondary | ICD-10-CM | POA: Diagnosis not present

## 2018-02-03 DIAGNOSIS — I130 Hypertensive heart and chronic kidney disease with heart failure and stage 1 through stage 4 chronic kidney disease, or unspecified chronic kidney disease: Secondary | ICD-10-CM | POA: Diagnosis not present

## 2018-02-03 DIAGNOSIS — G4733 Obstructive sleep apnea (adult) (pediatric): Secondary | ICD-10-CM | POA: Diagnosis not present

## 2018-02-03 DIAGNOSIS — T84012D Broken internal right knee prosthesis, subsequent encounter: Secondary | ICD-10-CM

## 2018-02-05 ENCOUNTER — Telehealth (INDEPENDENT_AMBULATORY_CARE_PROVIDER_SITE_OTHER): Payer: Self-pay | Admitting: Orthopedic Surgery

## 2018-02-05 NOTE — Telephone Encounter (Signed)
Patient is having surgery on 6/5, she said she is in a lot of pain and asking for some kind of pain medication. *Patient advised there are no providers and would be atleast Tuesday before addressed.* # [phone]

## 2018-02-09 DIAGNOSIS — E1122 Type 2 diabetes mellitus with diabetic chronic kidney disease: Secondary | ICD-10-CM | POA: Diagnosis not present

## 2018-02-09 DIAGNOSIS — Z87891 Personal history of nicotine dependence: Secondary | ICD-10-CM | POA: Diagnosis not present

## 2018-02-09 DIAGNOSIS — Z9181 History of falling: Secondary | ICD-10-CM | POA: Diagnosis not present

## 2018-02-09 DIAGNOSIS — Z794 Long term (current) use of insulin: Secondary | ICD-10-CM | POA: Diagnosis not present

## 2018-02-09 DIAGNOSIS — I509 Heart failure, unspecified: Secondary | ICD-10-CM | POA: Diagnosis not present

## 2018-02-09 DIAGNOSIS — I130 Hypertensive heart and chronic kidney disease with heart failure and stage 1 through stage 4 chronic kidney disease, or unspecified chronic kidney disease: Secondary | ICD-10-CM | POA: Diagnosis not present

## 2018-02-09 DIAGNOSIS — G4733 Obstructive sleep apnea (adult) (pediatric): Secondary | ICD-10-CM | POA: Diagnosis not present

## 2018-02-09 DIAGNOSIS — T8484XD Pain due to internal orthopedic prosthetic devices, implants and grafts, subsequent encounter: Secondary | ICD-10-CM | POA: Diagnosis not present

## 2018-02-09 DIAGNOSIS — N182 Chronic kidney disease, stage 2 (mild): Secondary | ICD-10-CM | POA: Diagnosis not present

## 2018-02-09 DIAGNOSIS — Z96652 Presence of left artificial knee joint: Secondary | ICD-10-CM | POA: Diagnosis not present

## 2018-02-09 DIAGNOSIS — E1140 Type 2 diabetes mellitus with diabetic neuropathy, unspecified: Secondary | ICD-10-CM | POA: Diagnosis not present

## 2018-02-09 NOTE — Pre-Procedure Instructions (Addendum)
[name]  02/09/2018    Your procedure is scheduled on Wednesday, February 17, 2018 at 10:20 AM.   Report to [facility] Entrance "A" Admitting Office at 8:20 AM.   Call this number if you have problems the morning of surgery: [phone]   Questions prior to day of surgery, please call [phone] between 8 & 4 PM.   Remember:  Do not eat food or drink liquids after midnight Tuesday, 02/16/18.  Take these medicines the morning of surgery with A SIP OF WATER: Tylenol - if needed  Stop Aspirin only if instructed by surgeon or physician. Do not use NSAIDS (Ibuprofen, Aleve, etc) prior to surgery.  Do not smoke 24 hours prior to surgery.   How to Manage Your Diabetes Before Surgery   Why is it important to control my blood sugar before and after surgery?   Improving blood sugar levels before and after surgery helps healing and can limit problems.  A way of improving blood sugar control is eating a healthy diet by:  - Eating less sugar and carbohydrates  - Increasing activity/exercise  - Talk with your doctor about reaching your blood sugar goals  High blood sugars (greater than 180 mg/dL) can raise your risk of infections and slow down your recovery so you will need to focus on controlling your diabetes during the weeks before surgery.  Make sure that the doctor who takes care of your diabetes knows about your planned surgery including the date and location.  How do I manage my blood sugars before surgery?   Check your blood sugar at least 4 times a day, 2 days before surgery to make sure that they are not too high or low.  Check your blood sugar the morning of your surgery when you wake up and every 2 hours until you get to the Short-Stay unit.  Treat a low blood sugar (less than 70 mg/dL) with 1/2 cup of clear juice (cranberry or apple), 4 glucose tablets, OR glucose gel.  Recheck blood sugar in 15 minutes after treatment (to make sure it is greater than 70  mg/dL).  If blood sugar is not greater than 70 mg/dL on re-check, call [phone] for further instructions.   Report your blood sugar to the Short-Stay nurse when you get to Short-Stay.  References:  [facility], 2007 "How to Manage your Diabetes Before and After Surgery".   Do not wear jewelry, make-up or nail polish.  Do not wear lotions, powders, perfumes or deodorant.  Do not shave 48 hours prior to surgery.    Do not bring valuables to the hospital.  [facility] is not responsible for any belongings or valuables.  Contacts, dentures or bridgework may not be worn into surgery.  Leave your suitcase in the car.  After surgery it may be brought to your room.  For patients admitted to the hospital, discharge time will be determined by your treatment team.  [facility] - Preparing for Surgery  Before surgery, you can play an important role.  Because skin is not sterile, your skin needs to be as free of germs as possible.  You can reduce the number of germs on you skin by washing with CHG (chlorahexidine gluconate) soap before surgery.  CHG is an antiseptic cleaner which kills germs and bonds with the skin to continue killing germs even after washing.  Oral Hygiene is also important in reducing the risk of infection.  Remember to brush your teeth  with your regular toothpaste the morning of surgery.  Please DO NOT use if you have an allergy to CHG or antibacterial soaps.  If your skin becomes reddened/irritated stop using the CHG and inform your nurse when you arrive at Short Stay.  Do not shave (including legs and underarms) for at least 48 hours prior to the first CHG shower.  You may shave your face.  Please follow these instructions carefully:   1.  Shower with CHG Soap the night before surgery and the morning of Surgery.  2.  If you choose to wash your hair, wash your hair first as usual with your normal shampoo.  3.  After you shampoo, rinse your hair and  body thoroughly to remove the shampoo. 4.  Use CHG as you would any other liquid soap.  You can apply chg directly to the skin and wash gently with a      scrungie or washcloth.           5.  Apply the CHG Soap to your body ONLY FROM THE NECK DOWN.   Do not use on open wounds or open sores. Avoid contact with your eyes, ears, mouth and genitals (private parts).  Wash genitals (private parts) with your normal soap.  6.  Wash thoroughly, paying special attention to the area where your surgery will be performed.  7.  Thoroughly rinse your body with warm water from the neck down.  8.  DO NOT shower/wash with your normal soap after using and rinsing off the CHG Soap.  9.  Pat yourself dry with a clean towel.            10.  Wear clean pajamas.            11.  Place clean sheets on your bed the night of your first shower and do not sleep with pets.  Day of Surgery  Shower as above. Do not apply any lotions/deodorants the morning of surgery.   Please wear clean clothes to the hospital. Remember to brush your teeth with toothpaste.   Please read over the fact sheets that you were given.

## 2018-02-09 NOTE — Telephone Encounter (Signed)
The doctor that is currently prescribing her narcotic medicines should continue prescribing.  I can fill prescriptions postoperatively.

## 2018-02-09 NOTE — Telephone Encounter (Signed)
Pt is sch for a total knee revision next month. She is asking for pain medication now. She has routinely received rx for Oxycodone 30 mg #150 from another provider except for this month. She also receives Lyrica 150 mg TID. Please advise.

## 2018-02-10 ENCOUNTER — Encounter (HOSPITAL_COMMUNITY)
Admission: RE | Admit: 2018-02-10 | Discharge: 2018-02-10 | Disposition: A | Discharge status: Home / Self Care | Payer: Medicare Other | Source: Ambulatory Visit | Attending: Orthopedic Surgery | Admitting: Orthopedic Surgery

## 2018-02-10 ENCOUNTER — Other Ambulatory Visit: Payer: Self-pay

## 2018-02-10 ENCOUNTER — Encounter (HOSPITAL_COMMUNITY): Payer: Self-pay

## 2018-02-10 DIAGNOSIS — Z7982 Long term (current) use of aspirin: Secondary | ICD-10-CM | POA: Insufficient documentation

## 2018-02-10 DIAGNOSIS — Z6841 Body Mass Index (BMI) 40.0 and over, adult: Secondary | ICD-10-CM | POA: Insufficient documentation

## 2018-02-10 DIAGNOSIS — K219 Gastro-esophageal reflux disease without esophagitis: Secondary | ICD-10-CM | POA: Diagnosis not present

## 2018-02-10 DIAGNOSIS — Z79899 Other long term (current) drug therapy: Secondary | ICD-10-CM | POA: Insufficient documentation

## 2018-02-10 DIAGNOSIS — I110 Hypertensive heart disease with heart failure: Secondary | ICD-10-CM | POA: Insufficient documentation

## 2018-02-10 DIAGNOSIS — Z96653 Presence of artificial knee joint, bilateral: Secondary | ICD-10-CM | POA: Insufficient documentation

## 2018-02-10 DIAGNOSIS — Z01818 Encounter for other preprocedural examination: Secondary | ICD-10-CM | POA: Diagnosis not present

## 2018-02-10 DIAGNOSIS — Y838 Other surgical procedures as the cause of abnormal reaction of the patient, or of later complication, without mention of misadventure at the time of the procedure: Secondary | ICD-10-CM | POA: Diagnosis not present

## 2018-02-10 DIAGNOSIS — Z0183 Encounter for blood typing: Secondary | ICD-10-CM | POA: Insufficient documentation

## 2018-02-10 DIAGNOSIS — I509 Heart failure, unspecified: Secondary | ICD-10-CM | POA: Diagnosis not present

## 2018-02-10 DIAGNOSIS — E1140 Type 2 diabetes mellitus with diabetic neuropathy, unspecified: Secondary | ICD-10-CM | POA: Diagnosis not present

## 2018-02-10 DIAGNOSIS — G4730 Sleep apnea, unspecified: Secondary | ICD-10-CM | POA: Diagnosis not present

## 2018-02-10 DIAGNOSIS — Z01812 Encounter for preprocedural laboratory examination: Secondary | ICD-10-CM | POA: Insufficient documentation

## 2018-02-10 DIAGNOSIS — T84092A Other mechanical complication of internal right knee prosthesis, initial encounter: Secondary | ICD-10-CM | POA: Diagnosis not present

## 2018-02-10 DIAGNOSIS — Z981 Arthrodesis status: Secondary | ICD-10-CM | POA: Diagnosis not present

## 2018-02-10 DIAGNOSIS — I429 Cardiomyopathy, unspecified: Secondary | ICD-10-CM | POA: Insufficient documentation

## 2018-02-10 DIAGNOSIS — M1990 Unspecified osteoarthritis, unspecified site: Secondary | ICD-10-CM | POA: Diagnosis not present

## 2018-02-10 HISTORY — DX: Cardiomyopathy, unspecified: I42.9

## 2018-02-10 HISTORY — DX: Heart failure, unspecified: I50.9

## 2018-02-10 LAB — CBC
HCT: 45.1 % (ref 36.0–46.0)
HEMOGLOBIN: 14.6 g/dL (ref 12.0–15.0)
MCH: 30.2 pg (ref 26.0–34.0)
MCHC: 32.4 g/dL (ref 30.0–36.0)
MCV: 93.2 fL (ref 78.0–100.0)
Platelets: 192 10*3/uL (ref 150–400)
RBC: 4.84 MIL/uL (ref 3.87–5.11)
RDW: 15.2 % (ref 11.5–15.5)
WBC: 5.9 10*3/uL (ref 4.0–10.5)

## 2018-02-10 LAB — COMPREHENSIVE METABOLIC PANEL
ALBUMIN: 2.9 g/dL — AB (ref 3.5–5.0)
ALK PHOS: 92 U/L (ref 38–126)
ALT: 33 U/L (ref 14–54)
ANION GAP: 7 (ref 5–15)
AST: 49 U/L — AB (ref 15–41)
BUN: 25 mg/dL — ABNORMAL HIGH (ref 6–20)
CALCIUM: 8.5 mg/dL — AB (ref 8.9–10.3)
CO2: 23 mmol/L (ref 22–32)
Chloride: 109 mmol/L (ref 101–111)
Creatinine, Ser: 1.17 mg/dL — ABNORMAL HIGH (ref 0.44–1.00)
GFR calc Af Amer: >60 mL/min (ref >60)
GFR calc non Af Amer: 53 mL/min — ABNORMAL LOW (ref >60)
GLUCOSE: 175 mg/dL — AB (ref 65–99)
POTASSIUM: 4.2 mmol/L (ref 3.5–5.1)
SODIUM: 139 mmol/L (ref 135–145)
Total Bilirubin: 1.7 mg/dL — ABNORMAL HIGH (ref 0.3–1.2)
Total Protein: 8 g/dL (ref 6.5–8.1)

## 2018-02-10 LAB — TYPE AND SCREEN
ABO/RH(D): O POS
ANTIBODY SCREEN: NEGATIVE

## 2018-02-10 LAB — PROTIME-INR
INR: 1.16
Prothrombin Time: 14.7 seconds (ref 11.4–15.2)

## 2018-02-10 LAB — SURGICAL PCR SCREEN
MRSA, PCR: NEGATIVE
STAPHYLOCOCCUS AUREUS: NEGATIVE

## 2018-02-10 LAB — GLUCOSE, CAPILLARY: Glucose-Capillary: 151 mg/dL — ABNORMAL HIGH (ref 65–99)

## 2018-02-10 LAB — APTT: APTT: 30 s (ref 24–36)

## 2018-02-10 NOTE — Telephone Encounter (Signed)
I called and lm on vm to advise pt of message below. To call with any questions.

## 2018-02-10 NOTE — Progress Notes (Addendum)
Pt has hx of CHF, denies any other heart issues. Pt denies any recent chest pain or sob. Pt has lost 100 lbs recently. Pt states she is a type 2 diabetic. Last A1C was 5.3 two weeks ago per pt. Pt states her fasting blood sugar is usually around 120. No longer on medication for diabetes.  Have requested last office visit notes and Echo report from Dr. Bonney Roussel office. Pt stated that she had an Echo done at his office and a stress test done here.

## 2018-02-11 DIAGNOSIS — Z87891 Personal history of nicotine dependence: Secondary | ICD-10-CM | POA: Diagnosis not present

## 2018-02-11 DIAGNOSIS — E1122 Type 2 diabetes mellitus with diabetic chronic kidney disease: Secondary | ICD-10-CM | POA: Diagnosis not present

## 2018-02-11 DIAGNOSIS — Z9181 History of falling: Secondary | ICD-10-CM | POA: Diagnosis not present

## 2018-02-11 DIAGNOSIS — E1140 Type 2 diabetes mellitus with diabetic neuropathy, unspecified: Secondary | ICD-10-CM | POA: Diagnosis not present

## 2018-02-11 DIAGNOSIS — I130 Hypertensive heart and chronic kidney disease with heart failure and stage 1 through stage 4 chronic kidney disease, or unspecified chronic kidney disease: Secondary | ICD-10-CM | POA: Diagnosis not present

## 2018-02-11 DIAGNOSIS — G4733 Obstructive sleep apnea (adult) (pediatric): Secondary | ICD-10-CM | POA: Diagnosis not present

## 2018-02-11 DIAGNOSIS — I509 Heart failure, unspecified: Secondary | ICD-10-CM | POA: Diagnosis not present

## 2018-02-11 DIAGNOSIS — Z96652 Presence of left artificial knee joint: Secondary | ICD-10-CM | POA: Diagnosis not present

## 2018-02-11 DIAGNOSIS — Z794 Long term (current) use of insulin: Secondary | ICD-10-CM | POA: Diagnosis not present

## 2018-02-11 DIAGNOSIS — T8484XD Pain due to internal orthopedic prosthetic devices, implants and grafts, subsequent encounter: Secondary | ICD-10-CM | POA: Diagnosis not present

## 2018-02-11 DIAGNOSIS — N182 Chronic kidney disease, stage 2 (mild): Secondary | ICD-10-CM | POA: Diagnosis not present

## 2018-02-11 NOTE — Progress Notes (Signed)
Anesthesia Chart Review:   Case:  [id number] Date/Time:  02/17/18 1005   Procedure:  RIGHT TOTAL KNEE REVISION (Right Knee)   Anesthesia type:  Choice   Pre-op diagnosis:  Failed Right Total Knee Arthroplasty   Location:  [facility] OR ROOM 06 / [facility]   Surgeon:  Newt Minion, MD      DISCUSSION: - Pt is a 52 year old female with morbid obesity, hx CHF, cardiomyopathy (thought to be non-ischemic), DM (has lost 100 pounds, no longer needs meds),    VS: BP 124/72   Pulse 79   Temp 36.6 C (Oral)   Resp 20   Ht 5\' 3"  (1.6 m)   Wt (!) 334 lb (151.5 kg)   LMP 11/09/2012   SpO2 96%   BMI 59.17 kg/m   PROVIDERS: - PCP is Benito Mccreedy, MD - Cardiologist is Vernell Leep, MD.  Pt cleared for surgery at last office visit 01/29/18.    LABS: Labs reviewed: Acceptable for surgery. (all labs ordered are listed, but only abnormal results are displayed)  Labs Reviewed  GLUCOSE, CAPILLARY - Abnormal; Notable for the following components:      Result Value   Glucose-Capillary 151 (*)    All other components within normal limits  COMPREHENSIVE METABOLIC PANEL - Abnormal; Notable for the following components:   Glucose, Bld 175 (*)    BUN 25 (*)    Creatinine, Ser 1.17 (*)    Calcium 8.5 (*)    Albumin 2.9 (*)    AST 49 (*)    Total Bilirubin 1.7 (*)    GFR calc non Af Amer 53 (*)    All other components within normal limits  SURGICAL PCR SCREEN  APTT  CBC  PROTIME-INR  TYPE AND SCREEN     IMAGES:  CXR 11/10/17: Cardiomegaly without acute disease.   EKG 11/11/17: Sinus tachycardia (102 bpm). Non-specific intra-ventricular conduction block.Possible Lateral infarct , age undetermined   CV:  Nuclear stress test 01/08/18:  1. No reversible ischemia or infarction. 2. Global hypokinesis with severe left ventricular dilatation. 3. Left ventricular ejection fraction 31% 4. High-risk stress test findings  Echo 11/06/17 (Dr. Matthias Hughs office):  1.  Mild concentric LVH with  normal LV internal dimension and wall motion.  EF 33%. 2.  Mild RV dilation 3.  Evidence of grade 1 LV diastolic dysfunction. 4.  RA normal, LA normal. 5.  Aortic root normal in size.  Aortic valve mildly calcified. 6.  Tricuspid pulmonic, aortic, and mitral valves are morphologically normal and open adequately.  Mitral valve is mildly thickened. 7.  Interatrial and interventricular septa are intact. 8.  Color-flow, pulse and continuous wave Doppler interrogation reveals trace pulmonic, mitral, and tricuspid regurgitation.  Estimated RVSP equals 25 mmHg, which is normal. 9.  No pericardial effusion present.  Pericardium is normal. 10.  IVC dilated 3.7 cm in size and has poor, if any, respiratory variation.  These findings are suggestive of an estimated RA pressure of 20 mmHg.   Past Medical History:  Diagnosis Date  . Arthritis    "hands, arms, feet, back, knees" (11/10/2017)  . Cardiomyopathy [facility])    From Dr. Bonney Roussel office notes from 01/13/18  . CHF (congestive heart failure) ([place])   . Chronic lower back pain   . Diabetic neuropathy ([place])   . Family history of adverse reaction to anesthesia    Mother is hard to arouse and blood pressure drops  . GERD (gastroesophageal reflux disease)   .  Hypertension   . Sleep apnea    "need new machine" (11/10/2017) - does not use a cpap  . Type II diabetes mellitus ([place])    no longer on medications (02/10/18)    Past Surgical History:  Procedure Laterality Date  . ANTERIOR CERVICAL DECOMP/DISCECTOMY FUSION  03/06/2010   C4-7 w/corpectomy/notes 03/17/2010  . CARPAL TUNNEL RELEASE Right   . HARDWARE REVISION  04/10/2010   cervical hardware revision screw replacement C4/notes 04/12/2010  . JOINT REPLACEMENT     bllateral total knees  . LAPAROSCOPIC CHOLECYSTECTOMY  1999  . LUMBAR LAMINECTOMY/DECOMPRESSION MICRODISCECTOMY  09/2010   Archie Endo 10/05/2010  . MULTIPLE EXTRACTIONS WITH ALVEOLOPLASTY [name] 01/19/2015   Procedure: MULTIPLE EXTRACTIONS  Three, Four, Five, Seven, eight, nine, ten, eleven, twelve, fourteen, eighteen, twenty-one, twenty-eight, twenty-nine, thirty-one with Alveoloplasty.  ;  Surgeon: Diona Browner, DDS;  Location: [facility];  Service: Oral Surgery;  Laterality: [name];  . REPLACEMENT TOTAL KNEE BILATERAL Bilateral 10/2007-03/2008   "left-right"  . TOENAIL EXCISION Bilateral    great toes  . TONSILLECTOMY      MEDICATIONS: . acetaminophen (TYLENOL) 650 MG CR tablet  . aspirin EC 81 MG tablet  . furosemide (LASIX) 40 MG tablet  . lisinopril-hydrochlorothiazide (PRINZIDE,ZESTORETIC) 20-25 MG tablet  . rosuvastatin (CRESTOR) 20 MG tablet  . sacubitril-valsartan (ENTRESTO) 49-51 MG   No current facility-administered medications for this encounter.     If no changes, I anticipate pt can proceed with surgery as scheduled.   Willeen Cass, FNP-BC [facility] Short Stay Surgical Center/Anesthesiology Phone: [phone] 02/11/2018 12:22 PM

## 2018-02-15 DIAGNOSIS — E1122 Type 2 diabetes mellitus with diabetic chronic kidney disease: Secondary | ICD-10-CM | POA: Diagnosis not present

## 2018-02-15 DIAGNOSIS — E1140 Type 2 diabetes mellitus with diabetic neuropathy, unspecified: Secondary | ICD-10-CM | POA: Diagnosis not present

## 2018-02-15 DIAGNOSIS — T8484XD Pain due to internal orthopedic prosthetic devices, implants and grafts, subsequent encounter: Secondary | ICD-10-CM | POA: Diagnosis not present

## 2018-02-15 DIAGNOSIS — I509 Heart failure, unspecified: Secondary | ICD-10-CM | POA: Diagnosis not present

## 2018-02-15 DIAGNOSIS — Z794 Long term (current) use of insulin: Secondary | ICD-10-CM | POA: Diagnosis not present

## 2018-02-15 DIAGNOSIS — I130 Hypertensive heart and chronic kidney disease with heart failure and stage 1 through stage 4 chronic kidney disease, or unspecified chronic kidney disease: Secondary | ICD-10-CM | POA: Diagnosis not present

## 2018-02-15 DIAGNOSIS — Z9181 History of falling: Secondary | ICD-10-CM | POA: Diagnosis not present

## 2018-02-15 DIAGNOSIS — N182 Chronic kidney disease, stage 2 (mild): Secondary | ICD-10-CM | POA: Diagnosis not present

## 2018-02-15 DIAGNOSIS — G4733 Obstructive sleep apnea (adult) (pediatric): Secondary | ICD-10-CM | POA: Diagnosis not present

## 2018-02-15 DIAGNOSIS — Z96652 Presence of left artificial knee joint: Secondary | ICD-10-CM | POA: Diagnosis not present

## 2018-02-15 DIAGNOSIS — Z87891 Personal history of nicotine dependence: Secondary | ICD-10-CM | POA: Diagnosis not present

## 2018-02-16 MED ORDER — DEXTROSE 5 % IV SOLN
3.0000 g | INTRAVENOUS | Status: AC
  Administered 2018-02-17: 3 g via INTRAVENOUS
  Filled 2018-02-16: qty 3

## 2018-02-17 ENCOUNTER — Inpatient Hospital Stay (HOSPITAL_COMMUNITY): Payer: Medicare Other | Admitting: Emergency Medicine

## 2018-02-17 ENCOUNTER — Other Ambulatory Visit: Payer: Self-pay

## 2018-02-17 ENCOUNTER — Encounter (HOSPITAL_COMMUNITY)
Admission: RE | Disposition: A | Discharge status: Skilled Nursing Facility | Payer: Self-pay | Source: Ambulatory Visit | Attending: Orthopedic Surgery

## 2018-02-17 ENCOUNTER — Inpatient Hospital Stay (HOSPITAL_COMMUNITY)
Admission: RE | Admit: 2018-02-17 | Discharge: 2018-02-20 | DRG: 467 | Disposition: A | Discharge status: Skilled Nursing Facility | Payer: Medicare Other | Source: Ambulatory Visit | Attending: Orthopedic Surgery | Admitting: Orthopedic Surgery

## 2018-02-17 ENCOUNTER — Encounter (HOSPITAL_COMMUNITY): Payer: Self-pay | Admitting: *Deleted

## 2018-02-17 ENCOUNTER — Inpatient Hospital Stay (HOSPITAL_COMMUNITY): Payer: Medicare Other | Admitting: Certified Registered"

## 2018-02-17 DIAGNOSIS — M2550 Pain in unspecified joint: Secondary | ICD-10-CM | POA: Diagnosis not present

## 2018-02-17 DIAGNOSIS — K219 Gastro-esophageal reflux disease without esophagitis: Secondary | ICD-10-CM | POA: Diagnosis not present

## 2018-02-17 DIAGNOSIS — T84012S Broken internal right knee prosthesis, sequela: Secondary | ICD-10-CM | POA: Diagnosis not present

## 2018-02-17 DIAGNOSIS — E1140 Type 2 diabetes mellitus with diabetic neuropathy, unspecified: Secondary | ICD-10-CM | POA: Diagnosis present

## 2018-02-17 DIAGNOSIS — G4730 Sleep apnea, unspecified: Secondary | ICD-10-CM | POA: Diagnosis present

## 2018-02-17 DIAGNOSIS — G894 Chronic pain syndrome: Secondary | ICD-10-CM | POA: Diagnosis not present

## 2018-02-17 DIAGNOSIS — T84092A Other mechanical complication of internal right knee prosthesis, initial encounter: Principal | ICD-10-CM | POA: Diagnosis present

## 2018-02-17 DIAGNOSIS — E785 Hyperlipidemia, unspecified: Secondary | ICD-10-CM | POA: Diagnosis not present

## 2018-02-17 DIAGNOSIS — M1711 Unilateral primary osteoarthritis, right knee: Secondary | ICD-10-CM | POA: Diagnosis present

## 2018-02-17 DIAGNOSIS — N182 Chronic kidney disease, stage 2 (mild): Secondary | ICD-10-CM | POA: Diagnosis present

## 2018-02-17 DIAGNOSIS — Z6841 Body Mass Index (BMI) 40.0 and over, adult: Secondary | ICD-10-CM

## 2018-02-17 DIAGNOSIS — G8918 Other acute postprocedural pain: Secondary | ICD-10-CM | POA: Diagnosis not present

## 2018-02-17 DIAGNOSIS — Z96651 Presence of right artificial knee joint: Secondary | ICD-10-CM | POA: Diagnosis not present

## 2018-02-17 DIAGNOSIS — I429 Cardiomyopathy, unspecified: Secondary | ICD-10-CM | POA: Diagnosis present

## 2018-02-17 DIAGNOSIS — R609 Edema, unspecified: Secondary | ICD-10-CM | POA: Diagnosis not present

## 2018-02-17 DIAGNOSIS — Z96659 Presence of unspecified artificial knee joint: Secondary | ICD-10-CM

## 2018-02-17 DIAGNOSIS — E1122 Type 2 diabetes mellitus with diabetic chronic kidney disease: Secondary | ICD-10-CM | POA: Diagnosis present

## 2018-02-17 DIAGNOSIS — F17210 Nicotine dependence, cigarettes, uncomplicated: Secondary | ICD-10-CM | POA: Diagnosis present

## 2018-02-17 DIAGNOSIS — Z23 Encounter for immunization: Secondary | ICD-10-CM

## 2018-02-17 DIAGNOSIS — Z7401 Bed confinement status: Secondary | ICD-10-CM | POA: Diagnosis not present

## 2018-02-17 DIAGNOSIS — I130 Hypertensive heart and chronic kidney disease with heart failure and stage 1 through stage 4 chronic kidney disease, or unspecified chronic kidney disease: Secondary | ICD-10-CM | POA: Diagnosis not present

## 2018-02-17 DIAGNOSIS — I509 Heart failure, unspecified: Secondary | ICD-10-CM | POA: Diagnosis present

## 2018-02-17 DIAGNOSIS — E119 Type 2 diabetes mellitus without complications: Secondary | ICD-10-CM | POA: Diagnosis not present

## 2018-02-17 DIAGNOSIS — Y792 Prosthetic and other implants, materials and accessory orthopedic devices associated with adverse incidents: Secondary | ICD-10-CM | POA: Diagnosis not present

## 2018-02-17 DIAGNOSIS — M6281 Muscle weakness (generalized): Secondary | ICD-10-CM | POA: Diagnosis not present

## 2018-02-17 DIAGNOSIS — T84012A Broken internal right knee prosthesis, initial encounter: Secondary | ICD-10-CM

## 2018-02-17 DIAGNOSIS — Z9049 Acquired absence of other specified parts of digestive tract: Secondary | ICD-10-CM | POA: Diagnosis not present

## 2018-02-17 DIAGNOSIS — I10 Essential (primary) hypertension: Secondary | ICD-10-CM | POA: Diagnosis not present

## 2018-02-17 DIAGNOSIS — R2681 Unsteadiness on feet: Secondary | ICD-10-CM | POA: Diagnosis not present

## 2018-02-17 DIAGNOSIS — G802 Spastic hemiplegic cerebral palsy: Secondary | ICD-10-CM | POA: Diagnosis not present

## 2018-02-17 DIAGNOSIS — T84012D Broken internal right knee prosthesis, subsequent encounter: Secondary | ICD-10-CM

## 2018-02-17 HISTORY — DX: Pure hypercholesterolemia, unspecified: E78.00

## 2018-02-17 HISTORY — PX: TOTAL KNEE REVISION: SHX996

## 2018-02-17 LAB — GLUCOSE, CAPILLARY
GLUCOSE-CAPILLARY: 212 mg/dL — AB (ref 65–99)
GLUCOSE-CAPILLARY: 288 mg/dL — AB (ref 65–99)

## 2018-02-17 SURGERY — TOTAL KNEE REVISION
Anesthesia: General | Site: Knee | Laterality: Right

## 2018-02-17 MED ORDER — PNEUMOCOCCAL VAC POLYVALENT 25 MCG/0.5ML IJ INJ
0.5000 mL | INJECTION | INTRAMUSCULAR | Status: AC
  Administered 2018-02-20: 0.5 mL via INTRAMUSCULAR
  Filled 2018-02-17: qty 0.5

## 2018-02-17 MED ORDER — LISINOPRIL 20 MG PO TABS
20.0000 mg | ORAL_TABLET | Freq: Every day | ORAL | Status: DC
  Administered 2018-02-18 – 2018-02-19 (×2): 20 mg via ORAL
  Filled 2018-02-17 (×3): qty 1

## 2018-02-17 MED ORDER — ASPIRIN EC 325 MG PO TBEC
325.0000 mg | DELAYED_RELEASE_TABLET | Freq: Every day | ORAL | Status: DC
  Administered 2018-02-18 – 2018-02-20 (×3): 325 mg via ORAL
  Filled 2018-02-17 (×3): qty 1

## 2018-02-17 MED ORDER — LIDOCAINE HCL (CARDIAC) PF 100 MG/5ML IV SOSY
PREFILLED_SYRINGE | INTRAVENOUS | Status: DC | PRN
  Administered 2018-02-17: 100 mg via INTRAVENOUS

## 2018-02-17 MED ORDER — PROPOFOL 10 MG/ML IV BOLUS
INTRAVENOUS | Status: AC
  Filled 2018-02-17: qty 20

## 2018-02-17 MED ORDER — HYDROMORPHONE HCL 1 MG/ML IJ SOLN
INTRAMUSCULAR | Status: AC
  Filled 2018-02-17: qty 0.5

## 2018-02-17 MED ORDER — LACTATED RINGERS IV SOLN
INTRAVENOUS | Status: DC
  Administered 2018-02-17 (×2): via INTRAVENOUS

## 2018-02-17 MED ORDER — SODIUM CHLORIDE 0.9 % IV SOLN
INTRAVENOUS | Status: DC
  Administered 2018-02-17: 18:00:00 via INTRAVENOUS

## 2018-02-17 MED ORDER — CHLORHEXIDINE GLUCONATE 4 % EX LIQD: 60.0000 mL | Freq: Once | CUTANEOUS | Status: DC

## 2018-02-17 MED ORDER — TRANEXAMIC ACID 1000 MG/10ML IV SOLN
1000.0000 mg | INTRAVENOUS | Status: AC
  Administered 2018-02-17: 1000 mg via INTRAVENOUS
  Filled 2018-02-17: qty 1100

## 2018-02-17 MED ORDER — FENTANYL CITRATE (PF) 100 MCG/2ML IJ SOLN
INTRAMUSCULAR | Status: DC | PRN
  Administered 2018-02-17 (×2): 100 ug via INTRAVENOUS
  Administered 2018-02-17: 50 ug via INTRAVENOUS

## 2018-02-17 MED ORDER — ONDANSETRON HCL 4 MG/2ML IJ SOLN
INTRAMUSCULAR | Status: DC | PRN
  Administered 2018-02-17: 4 mg via INTRAVENOUS

## 2018-02-17 MED ORDER — SODIUM CHLORIDE 0.9 % IV SOLN
2000.0000 mg | INTRAVENOUS | Status: AC
  Administered 2018-02-17: 2000 mg via TOPICAL
  Filled 2018-02-17: qty 20

## 2018-02-17 MED ORDER — MEPERIDINE HCL 50 MG/ML IJ SOLN: 6.2500 mg | INTRAMUSCULAR | Status: DC | PRN

## 2018-02-17 MED ORDER — NICOTINE 7 MG/24HR TD PT24
7.0000 mg | MEDICATED_PATCH | Freq: Every day | TRANSDERMAL | Status: DC
  Administered 2018-02-18 – 2018-02-20 (×4): 7 mg via TRANSDERMAL
  Filled 2018-02-17 (×4): qty 1

## 2018-02-17 MED ORDER — METOCLOPRAMIDE HCL 5 MG/ML IJ SOLN: 5.0000 mg | Freq: Three times a day (TID) | INTRAMUSCULAR | Status: DC | PRN

## 2018-02-17 MED ORDER — PROMETHAZINE HCL 25 MG/ML IJ SOLN: 6.2500 mg | INTRAMUSCULAR | Status: DC | PRN

## 2018-02-17 MED ORDER — MIDAZOLAM HCL 2 MG/2ML IJ SOLN
INTRAMUSCULAR | Status: AC
  Administered 2018-02-17: 1 mg via INTRAVENOUS
  Filled 2018-02-17: qty 2

## 2018-02-17 MED ORDER — HYDROMORPHONE HCL 2 MG/ML IJ SOLN
0.2500 mg | INTRAMUSCULAR | Status: DC | PRN
  Administered 2018-02-17 (×4): 0.5 mg via INTRAVENOUS

## 2018-02-17 MED ORDER — HYDROMORPHONE HCL 2 MG/ML IJ SOLN
INTRAMUSCULAR | Status: AC
  Filled 2018-02-17: qty 1

## 2018-02-17 MED ORDER — DEXAMETHASONE SODIUM PHOSPHATE 10 MG/ML IJ SOLN
INTRAMUSCULAR | Status: DC | PRN
  Administered 2018-02-17: 10 mg via INTRAVENOUS

## 2018-02-17 MED ORDER — ACETAMINOPHEN 10 MG/ML IV SOLN: 1000.0000 mg | Freq: Once | INTRAVENOUS | Status: DC | PRN

## 2018-02-17 MED ORDER — ROPIVACAINE HCL 5 MG/ML IJ SOLN
INTRAMUSCULAR | Status: DC | PRN
  Administered 2018-02-17 (×2): 5 mL via PERINEURAL

## 2018-02-17 MED ORDER — METOCLOPRAMIDE HCL 5 MG PO TABS
5.0000 mg | ORAL_TABLET | Freq: Three times a day (TID) | ORAL | Status: DC | PRN
  Administered 2018-02-19: 10 mg via ORAL
  Filled 2018-02-17 (×2): qty 2

## 2018-02-17 MED ORDER — SUCCINYLCHOLINE CHLORIDE 20 MG/ML IJ SOLN
INTRAMUSCULAR | Status: DC | PRN
  Administered 2018-02-17: 160 mg via INTRAVENOUS

## 2018-02-17 MED ORDER — MAGNESIUM CITRATE PO SOLN: 1.0000 | Freq: Once | ORAL | Status: DC | PRN

## 2018-02-17 MED ORDER — PROPOFOL 10 MG/ML IV BOLUS
INTRAVENOUS | Status: DC | PRN
  Administered 2018-02-17: 30 mg via INTRAVENOUS
  Administered 2018-02-17: 200 mg via INTRAVENOUS

## 2018-02-17 MED ORDER — SODIUM CHLORIDE 0.9 % IR SOLN
Status: DC | PRN
  Administered 2018-02-17: 3000 mL

## 2018-02-17 MED ORDER — POLYETHYLENE GLYCOL 3350 17 G PO PACK
17.0000 g | PACK | Freq: Every day | ORAL | Status: DC | PRN
  Administered 2018-02-19: 17 g via ORAL
  Filled 2018-02-17: qty 1

## 2018-02-17 MED ORDER — PHENOL 1.4 % MT LIQD: 1.0000 | OROMUCOSAL | Status: DC | PRN

## 2018-02-17 MED ORDER — LISINOPRIL-HYDROCHLOROTHIAZIDE 20-25 MG PO TABS: 1.0000 | ORAL_TABLET | Freq: Every day | ORAL | Status: DC

## 2018-02-17 MED ORDER — FENTANYL CITRATE (PF) 250 MCG/5ML IJ SOLN
INTRAMUSCULAR | Status: AC
  Filled 2018-02-17: qty 5

## 2018-02-17 MED ORDER — HYDROMORPHONE HCL 1 MG/ML IJ SOLN
INTRAMUSCULAR | Status: DC | PRN
  Administered 2018-02-17 (×2): 0.5 mg via INTRAVENOUS

## 2018-02-17 MED ORDER — ROCURONIUM BROMIDE 100 MG/10ML IV SOLN
INTRAVENOUS | Status: DC | PRN
  Administered 2018-02-17: 20 mg via INTRAVENOUS

## 2018-02-17 MED ORDER — MIDAZOLAM HCL 2 MG/2ML IJ SOLN
1.0000 mg | INTRAMUSCULAR | Status: DC | PRN
  Administered 2018-02-17: 1 mg via INTRAVENOUS

## 2018-02-17 MED ORDER — INSULIN ASPART 100 UNIT/ML ~~LOC~~ SOLN
0.0000 [IU] | Freq: Three times a day (TID) | SUBCUTANEOUS | Status: DC
  Administered 2018-02-17 – 2018-02-18 (×2): 8 [IU] via SUBCUTANEOUS
  Administered 2018-02-18: 5 [IU] via SUBCUTANEOUS
  Administered 2018-02-18: 8 [IU] via SUBCUTANEOUS
  Administered 2018-02-19: 5 [IU] via SUBCUTANEOUS
  Administered 2018-02-19: 3 [IU] via SUBCUTANEOUS
  Administered 2018-02-19: 5 [IU] via SUBCUTANEOUS
  Administered 2018-02-20 (×2): 3 [IU] via SUBCUTANEOUS

## 2018-02-17 MED ORDER — 0.9 % SODIUM CHLORIDE (POUR BTL) OPTIME
TOPICAL | Status: DC | PRN
  Administered 2018-02-17: 1000 mL

## 2018-02-17 MED ORDER — OXYCODONE HCL 5 MG PO TABS
5.0000 mg | ORAL_TABLET | ORAL | Status: DC | PRN
  Administered 2018-02-17 – 2018-02-20 (×2): 10 mg via ORAL
  Filled 2018-02-17 (×3): qty 2

## 2018-02-17 MED ORDER — ONDANSETRON HCL 4 MG/2ML IJ SOLN: 4.0000 mg | Freq: Four times a day (QID) | INTRAMUSCULAR | Status: DC | PRN

## 2018-02-17 MED ORDER — HYDROMORPHONE HCL 2 MG/ML IJ SOLN
0.5000 mg | INTRAMUSCULAR | Status: DC | PRN
  Administered 2018-02-18 – 2018-02-20 (×5): 1 mg via INTRAVENOUS
  Administered 2018-02-20: 0.5 mg via INTRAVENOUS
  Filled 2018-02-17 (×6): qty 1

## 2018-02-17 MED ORDER — DOCUSATE SODIUM 100 MG PO CAPS
100.0000 mg | ORAL_CAPSULE | Freq: Two times a day (BID) | ORAL | Status: DC
  Administered 2018-02-17 – 2018-02-20 (×6): 100 mg via ORAL
  Filled 2018-02-17 (×6): qty 1

## 2018-02-17 MED ORDER — ROSUVASTATIN CALCIUM 10 MG PO TABS
20.0000 mg | ORAL_TABLET | Freq: Every day | ORAL | Status: DC
  Administered 2018-02-17 – 2018-02-19 (×3): 20 mg via ORAL
  Filled 2018-02-17 (×3): qty 2

## 2018-02-17 MED ORDER — PHENYLEPHRINE HCL 10 MG/ML IJ SOLN
INTRAMUSCULAR | Status: DC | PRN
  Administered 2018-02-17: 80 ug via INTRAVENOUS

## 2018-02-17 MED ORDER — BISACODYL 10 MG RE SUPP: 10.0000 mg | Freq: Every day | RECTAL | Status: DC | PRN

## 2018-02-17 MED ORDER — CEFAZOLIN SODIUM-DEXTROSE 2-4 GM/100ML-% IV SOLN
2.0000 g | Freq: Four times a day (QID) | INTRAVENOUS | Status: AC
  Administered 2018-02-17 – 2018-02-18 (×2): 2 g via INTRAVENOUS
  Filled 2018-02-17 (×2): qty 100

## 2018-02-17 MED ORDER — OXYCODONE HCL 5 MG PO TABS
10.0000 mg | ORAL_TABLET | ORAL | Status: DC | PRN
  Administered 2018-02-17 – 2018-02-19 (×9): 15 mg via ORAL
  Administered 2018-02-19: 10 mg via ORAL
  Administered 2018-02-20: 15 mg via ORAL
  Filled 2018-02-17 (×10): qty 3

## 2018-02-17 MED ORDER — ONDANSETRON HCL 4 MG PO TABS: 4.0000 mg | ORAL_TABLET | Freq: Four times a day (QID) | ORAL | Status: DC | PRN

## 2018-02-17 MED ORDER — ACETAMINOPHEN 325 MG PO TABS
325.0000 mg | ORAL_TABLET | Freq: Four times a day (QID) | ORAL | Status: DC | PRN
  Administered 2018-02-20: 650 mg via ORAL
  Filled 2018-02-17: qty 2

## 2018-02-17 MED ORDER — FENTANYL CITRATE (PF) 100 MCG/2ML IJ SOLN
INTRAMUSCULAR | Status: AC
  Administered 2018-02-17: 50 ug via INTRAVENOUS
  Filled 2018-02-17: qty 2

## 2018-02-17 MED ORDER — MENTHOL 3 MG MT LOZG: 1.0000 | LOZENGE | OROMUCOSAL | Status: DC | PRN

## 2018-02-17 MED ORDER — FENTANYL CITRATE (PF) 100 MCG/2ML IJ SOLN
50.0000 ug | INTRAMUSCULAR | Status: DC | PRN
  Administered 2018-02-17: 50 ug via INTRAVENOUS

## 2018-02-17 MED ORDER — ROPIVACAINE HCL 7.5 MG/ML IJ SOLN
INTRAMUSCULAR | Status: DC | PRN
  Administered 2018-02-17 (×4): 5 mL via PERINEURAL

## 2018-02-17 MED ORDER — HYDROCHLOROTHIAZIDE 25 MG PO TABS
25.0000 mg | ORAL_TABLET | Freq: Every day | ORAL | Status: DC
  Administered 2018-02-18 – 2018-02-19 (×2): 25 mg via ORAL
  Filled 2018-02-17 (×3): qty 1

## 2018-02-17 SURGICAL SUPPLY — 58 items
BAG DECANTER FOR FLEXI CONT (MISCELLANEOUS) ×3 IMPLANT
BENZOIN TINCTURE PRP APPL 2/3 (GAUZE/BANDAGES/DRESSINGS) ×3 IMPLANT
BLADE SAGITTAL (BLADE) ×2
BLADE SAW SAG 90X13X1.27 (BLADE) ×3 IMPLANT
BLADE SAW SGTL 13.0X1.19X90.0M (BLADE) ×3 IMPLANT
BLADE SAW THK1.47X90X25X (BLADE) ×1 IMPLANT
BNDG COHESIVE 6X5 TAN STRL LF (GAUZE/BANDAGES/DRESSINGS) ×6 IMPLANT
BOWL SMART MIX CTS (DISPOSABLE) ×3 IMPLANT
CEMENT BONE R 1X40 (Cement) ×6 IMPLANT
COVER BACK TABLE 24X17X13 BIG (DRAPES) IMPLANT
COVER SURGICAL LIGHT HANDLE (MISCELLANEOUS) ×6 IMPLANT
DECANTER SPIKE VIAL GLASS SM (MISCELLANEOUS) ×3 IMPLANT
DRAPE EXTREMITY T 121X128X90 (DRAPE) ×3 IMPLANT
DRAPE HALF SHEET 40X57 (DRAPES) ×6 IMPLANT
DRAPE IMP U-DRAPE 54X76 (DRAPES) ×3 IMPLANT
DRAPE U-SHAPE 47X51 STRL (DRAPES) ×3 IMPLANT
DRSG ADAPTIC 3X8 NADH LF (GAUZE/BANDAGES/DRESSINGS) ×3 IMPLANT
DRSG PAD ABDOMINAL 8X10 ST (GAUZE/BANDAGES/DRESSINGS) ×3 IMPLANT
DURAPREP 26ML APPLICATOR (WOUND CARE) ×6 IMPLANT
ELECT REM PT RETURN 9FT ADLT (ELECTROSURGICAL) ×3
ELECTRODE REM PT RTRN 9FT ADLT (ELECTROSURGICAL) ×1 IMPLANT
FACESHIELD WRAPAROUND (MASK) ×3 IMPLANT
GAUZE SPONGE 4X4 12PLY STRL (GAUZE/BANDAGES/DRESSINGS) ×3 IMPLANT
GLOVE BIOGEL PI IND STRL 9 (GLOVE) ×1 IMPLANT
GLOVE BIOGEL PI INDICATOR 9 (GLOVE) ×2
GLOVE SURG ORTHO 9.0 STRL STRW (GLOVE) ×6 IMPLANT
GOWN STRL REUS W/ TWL XL LVL3 (GOWN DISPOSABLE) ×3 IMPLANT
GOWN STRL REUS W/TWL XL LVL3 (GOWN DISPOSABLE) ×6
HANDPIECE INTERPULSE COAX TIP (DISPOSABLE) ×2
IMMOBILIZER KNEE 22 UNIV (SOFTGOODS) ×3 IMPLANT
INSERT TIBIAL PFC 3 17.5 (Knees) ×3 IMPLANT
KIT BASIN OR (CUSTOM PROCEDURE TRAY) ×3 IMPLANT
KIT TURNOVER KIT B (KITS) ×3 IMPLANT
MANIFOLD NEPTUNE II (INSTRUMENTS) ×3 IMPLANT
NEEDLE SPNL 18GX3.5 QUINCKE PK (NEEDLE) ×3 IMPLANT
NS IRRIG 1000ML POUR BTL (IV SOLUTION) ×3 IMPLANT
PACK TOTAL JOINT (CUSTOM PROCEDURE TRAY) ×3 IMPLANT
PACK UNIVERSAL I (CUSTOM PROCEDURE TRAY) ×3 IMPLANT
PAD ARMBOARD 7.5X6 YLW CONV (MISCELLANEOUS) ×21 IMPLANT
PADDING CAST COTTON 6X4 STRL (CAST SUPPLIES) ×3 IMPLANT
SET HNDPC FAN SPRY TIP SCT (DISPOSABLE) ×1 IMPLANT
SLEEVE MBT PROUS ML 29MM (Knees) ×3 IMPLANT
STAPLER VISISTAT 35W (STAPLE) ×3 IMPLANT
STEM UNIV REV 115 X 6 FLUTED (Stem) ×3 IMPLANT
SUCTION FRAZIER HANDLE 10FR (MISCELLANEOUS)
SUCTION TUBE FRAZIER 10FR DISP (MISCELLANEOUS) IMPLANT
SUT VIC AB 0 CTB1 27 (SUTURE) ×3 IMPLANT
SUT VIC AB 1 CTX 36 (SUTURE) ×2
SUT VIC AB 1 CTX36XBRD ANBCTR (SUTURE) ×1 IMPLANT
SUT VIC AB 2-0 CTB1 (SUTURE) ×3 IMPLANT
SWAB CULTURE ESWAB REG 1ML (MISCELLANEOUS) ×3 IMPLANT
TOWEL OR 17X26 10 PK STRL BLUE (TOWEL DISPOSABLE) ×3 IMPLANT
TRAY FOLEY MTR SLVR 16FR STAT (SET/KITS/TRAYS/PACK) IMPLANT
TRAY REVISION SZ 3 (Knees) ×3 IMPLANT
WEDGE SIZE 3 15MM (Knees) ×3 IMPLANT
WEDGE SZ 3 10MM (Knees) ×3 IMPLANT
WRAP KNEE MAXI GEL POST OP (GAUZE/BANDAGES/DRESSINGS) ×3 IMPLANT
YANKAUER SUCT BULB TIP NO VENT (SUCTIONS) ×3 IMPLANT

## 2018-02-17 NOTE — Anesthesia Procedure Notes (Signed)
Anesthesia Regional Block: Adductor canal block   Pre-Anesthetic Checklist: ,, timeout performed, Correct Patient, Correct Site, Correct Laterality, Correct Procedure, Correct Position, site marked, Risks and benefits discussed,  Surgical consent,  Pre-op evaluation,  At surgeon's request and post-op pain management  Laterality: Right and Lower  Prep: chloraprep       Needles:  Injection technique: Single-shot  Needle Type: Echogenic Stimulator Needle     Needle Length: 10cm  Needle Gauge: 21   Needle insertion depth: 4 cm   Additional Needles:   Procedures:,,,, ultrasound used (permanent image in chart),,,,  Narrative:  Start time: 02/17/2018 10:05 AM End time: 02/17/2018 10:14 AM Injection made incrementally with aspirations every 5 mL.  Performed by: Personally  Anesthesiologist: Lyn Hollingshead, MD

## 2018-02-17 NOTE — Anesthesia Preprocedure Evaluation (Signed)
Anesthesia Evaluation  Patient identified by MRN, date of birth, ID band Patient awake    Reviewed: Allergy & Precautions, NPO status , Patient's Chart, lab work & pertinent test results  History of Anesthesia Complications Negative for: history of anesthetic complications  Airway Mallampati: II  TM Distance: >3 FB Neck ROM: Full    Dental no notable dental hx. (+) Teeth Intact   Pulmonary sleep apnea and Continuous Positive Airway Pressure Ventilation , Current Smoker,  Does not use CPAP   Pulmonary exam normal breath sounds clear to auscultation       Cardiovascular hypertension, Pt. on medications +CHF  negative cardio ROS Normal cardiovascular exam Rhythm:Regular Rate:Normal     Neuro/Psych PSYCHIATRIC DISORDERS Depression negative neurological ROS     GI/Hepatic Neg liver ROS, GERD  ,  Endo/Other  negative endocrine ROSdiabetes, Type 2, Insulin DependentMorbid obesity  Renal/GU Renal InsufficiencyRenal disease     Musculoskeletal  (+) Arthritis ,   Abdominal (+) + obese,   Peds  Hematology negative hematology ROS (+)   Anesthesia Other Findings   Reproductive/Obstetrics negative OB ROS                             Anesthesia Physical  Anesthesia Plan  ASA: III  Anesthesia Plan: General   Post-op Pain Management:  Regional for Post-op pain   Induction: Intravenous  PONV Risk Score and Plan: Ondansetron and Dexamethasone  Airway Management Planned: Oral ETT and Video Laryngoscope Planned  Additional Equipment:   Intra-op Plan:   Post-operative Plan: Extubation in OR  Informed Consent: I have reviewed the patients History and Physical, chart, labs and discussed the procedure including the risks, benefits and alternatives for the proposed anesthesia with the patient or authorized representative who has indicated his/her understanding and acceptance.   Dental advisory  given  Plan Discussed with: CRNA and Surgeon  Anesthesia Plan Comments:         Anesthesia Quick Evaluation

## 2018-02-17 NOTE — Discharge Instructions (Signed)
INSTRUCTIONS AFTER JOINT REPLACEMENT   o Remove items at home which could result in a fall. This includes throw rugs or furniture in walking pathways o ICE to the affected joint every three hours while awake for 30 minutes at a time, for at least the first 3-5 days, and then as needed for pain and swelling.  Continue to use ice for pain and swelling. You may notice swelling that will progress down to the foot and ankle.  This is normal after surgery.  Elevate your leg when you are not up walking on it.   o Continue to use the breathing machine you got in the hospital (incentive spirometer) which will help keep your temperature down.  It is common for your temperature to cycle up and down following surgery, especially at night when you are not up moving around and exerting yourself.  The breathing machine keeps your lungs expanded and your temperature down.   DIET:  As you were doing prior to hospitalization, we recommend a well-balanced diet.  DRESSING / WOUND CARE / SHOWERING  Keep dressing dry do not shower.  Do not remove for 2 weeks.  ACTIVITY  o Increase activity slowly as tolerated, but follow the weight bearing instructions below.   o No driving for 6 weeks or until further direction given by your physician.  You cannot drive while taking narcotics.  o No lifting or carrying greater than 10 lbs. until further directed by your surgeon. o Avoid periods of inactivity such as sitting longer than an hour when not asleep. This helps prevent blood clots.  o You may return to work once you are authorized by your doctor.     WEIGHT BEARING   Weight bearing as tolerated with assist device (walker, cane, etc) as directed, use it as long as suggested by your surgeon or therapist, typically at least 4-6 weeks.   EXERCISES  Results after joint replacement surgery are often greatly improved when you follow the exercise, range of motion and muscle strengthening exercises prescribed by your  doctor. Safety measures are also important to protect the joint from further injury. Any time any of these exercises cause you to have increased pain or swelling, decrease what you are doing until you are comfortable again and then slowly increase them. If you have problems or questions, call your caregiver or physical therapist for advice.   Rehabilitation is important following a joint replacement. After just a few days of immobilization, the muscles of the leg can become weakened and shrink (atrophy).  These exercises are designed to build up the tone and strength of the thigh and leg muscles and to improve motion. Often times heat used for twenty to thirty minutes before working out will loosen up your tissues and help with improving the range of motion but do not use heat for the first two weeks following surgery (sometimes heat can increase post-operative swelling).   These exercises can be done on a training (exercise) mat, on the floor, on a table or on a bed. Use whatever works the best and is most comfortable for you.    Use music or television while you are exercising so that the exercises are a pleasant break in your day. This will make your life better with the exercises acting as a break in your routine that you can look forward to.   Perform all exercises about fifteen times, three times per day or as directed.  You should exercise both the operative leg and  the other leg as well.  Exercises include:    Quad Sets - Tighten up the muscle on the front of the thigh (Quad) and hold for 5-10 seconds.    Straight Leg Raises - With your knee straight (if you were given a brace, keep it on), lift the leg to 60 degrees, hold for 3 seconds, and slowly lower the leg.  Perform this exercise against resistance later as your leg gets stronger.   Leg Slides: Lying on your back, slowly slide your foot toward your buttocks, bending your knee up off the floor (only go as far as is comfortable). Then slowly  slide your foot back down until your leg is flat on the floor again.   Angel Wings: Lying on your back spread your legs to the side as far apart as you can without causing discomfort.   Hamstring Strength:  Lying on your back, push your heel against the floor with your leg straight by tightening up the muscles of your buttocks.  Repeat, but this time bend your knee to a comfortable angle, and push your heel against the floor.  You may put a pillow under the heel to make it more comfortable if necessary.   A rehabilitation program following joint replacement surgery can speed recovery and prevent re-injury in the future due to weakened muscles. Contact your doctor or a physical therapist for more information on knee rehabilitation.    CONSTIPATION  Constipation is defined medically as fewer than three stools per week and severe constipation as less than one stool per week.  Even if you have a regular bowel pattern at home, your normal regimen is likely to be disrupted due to multiple reasons following surgery.  Combination of anesthesia, postoperative narcotics, change in appetite and fluid intake all can affect your bowels.   YOU MUST use at least one of the following options; they are listed in order of increasing strength to get the job done.  They are all available over the counter, and you may need to use some, POSSIBLY even all of these options:    Drink plenty of fluids (prune juice may be helpful) and high fiber foods Colace 100 mg by mouth twice a day  Senokot for constipation as directed and as needed Dulcolax (bisacodyl), take with full glass of water  Miralax (polyethylene glycol) once or twice a day as needed.  If you have tried all these things and are unable to have a bowel movement in the first 3-4 days after surgery call either your surgeon or your primary doctor.    If you experience loose stools or diarrhea, hold the medications until you stool forms back up.  If your symptoms  do not get better within 1 week or if they get worse, check with your doctor.  If you experience "the worst abdominal pain ever" or develop nausea or vomiting, please contact the office immediately for further recommendations for treatment.   ITCHING:  If you experience itching with your medications, try taking only a single pain pill, or even half a pain pill at a time.  You can also use Benadryl over the counter for itching or also to help with sleep.   TED HOSE STOCKINGS:  Use stockings on both legs until for at least 2 weeks or as directed by physician office. They may be removed at night for sleeping.  MEDICATIONS:  See your medication summary on the After Visit Summary that nursing will review with you.  You may  have some home medications which will be placed on hold until you complete the course of blood thinner medication.  It is important for you to complete the blood thinner medication as prescribed.  PRECAUTIONS:  If you experience chest pain or shortness of breath - call 911 immediately for transfer to the hospital emergency department.   If you develop a fever greater that 101 F, purulent drainage from wound, increased redness or drainage from wound, foul odor from the wound/dressing, or calf pain - CONTACT YOUR SURGEON.                                                   FOLLOW-UP APPOINTMENTS:  If you do not already have a post-op appointment, please call the office for an appointment to be seen by your surgeon.  Guidelines for how soon to be seen are listed in your After Visit Summary, but are typically between 1-4 weeks after surgery.  OTHER INSTRUCTIONS:   Knee Replacement:  Do not place pillow under knee, focus on keeping the knee straight while resting. CPM instructions: 0-90 degrees, 2 hours in the morning, 2 hours in the afternoon, and 2 hours in the evening. Place foam block, curve side up under heel at all times except when in CPM or when walking.  DO NOT modify, tear, cut,  or change the foam block in any way.  MAKE SURE YOU:   Understand these instructions.   Get help right away if you are not doing well or get worse.    Thank you for letting us be a part of your medical care team.  It is a privilege we respect greatly.  We hope these instructions will help you stay on track for a fast and full recovery!

## 2018-02-17 NOTE — Anesthesia Postprocedure Evaluation (Signed)
Anesthesia Post Note  Patient: [name]  Procedure(s) Performed: RIGHT TOTAL KNEE REVISION (Right Knee)     Patient location during evaluation: PACU Anesthesia Type: General Level of consciousness: sedated Pain management: pain level controlled Vital Signs Assessment: post-procedure vital signs reviewed and stable Respiratory status: spontaneous breathing Cardiovascular status: stable Postop Assessment: no apparent nausea or vomiting Anesthetic complications: no    Last Vitals:  Vitals:   02/17/18 1326 02/17/18 1330  BP: 110/79   Pulse: 92 92  Resp: 16 18  Temp:    SpO2: 95% 97%    Last Pain:  Vitals:   02/17/18 1400  TempSrc:   PainSc: Asleep   Pain Goal: Patients Stated Pain Goal: 6 (02/17/18 0917)      RLE Motor Response: Purposeful movement(able to wiggle toes) (02/17/18 1400) RLE Sensation: Full sensation (02/17/18 1400)      [place]

## 2018-02-17 NOTE — Op Note (Signed)
02/17/2018  12:58 PM  PATIENT:  [name]    PRE-OPERATIVE DIAGNOSIS:  Failed Right Total Knee Arthroplasty  POST-OPERATIVE DIAGNOSIS:  Same  PROCEDURE:  RIGHT TOTAL KNEE REVISION Revision of the tibia with stem and augments and sleeve with new tibial tray.  Please see photograph below for description of the inserts.  Application of wound VAC restore total knee dressing.   SURGEON:  Newt Minion, MD  PHYSICIAN ASSISTANT:None ANESTHESIA:   General  PREOPERATIVE INDICATIONS:  [name] is a  52 y.o. female with a diagnosis of Failed Right Total Knee Arthroplasty who failed conservative measures and elected for surgical management.    The risks benefits and alternatives were discussed with the patient preoperatively including but not limited to the risks of infection, bleeding, nerve injury, cardiopulmonary complications, the need for revision surgery, among others, and the patient was willing to proceed.  OPERATIVE IMPLANTS: See below     @ENCIMAGES @  OPERATIVE FINDINGS: Soft osteoporotic bone OPERATIVE PROCEDURE:  Patient was brought to the operating room underwent a general anesthetic.  After adequate levels of anesthesia were obtained patient's right lower extremity was prepped using DuraPrep draped into a Sterile Fld., Ioban was used to cover all exposed skin.  Her previous midline incision was used this was carried down to the medial parapatellar retinacular incision.  The patella was everted.  There was extreme amount of calcification medially on the patella and a saw was used to resect the heterotopic ossification.  The patella was retracted laterally.  Visualization showed complete subsidence of the tibial tray within the tibia.  Using a saw the tibial tray was was removed without difficulty.  Using sequential reaming the canal was reamed up to a size 16 and broached for the size 16.  The external alignment guide was used attached to the intramedullary rod and the  cut was made parallel and perpendicular to the long axis of the tibia.  Trial tibial component was placed after the canal was reamed and broached for the proximal sleeve.  This was tried with a 17.5 mm polyethylene tray and the knee had full extension with stable varus and valgus.  The collateral ligaments were intact.  The knee was irrigated with pulsatile lavage patient received TXA IV and topically.  3 L were irrigated through the pulsatile lavage.  The cement was mixed the bony ingrowth sleeve did not have a cement attachment there is no cement down this canal.  The knee was placed with the polyethylene tray the knee was left in extension until the cement hardened.  A lateral release was performed the knee was further irrigated with pulsatile lavage.  The retinaculum was closed with #1 Vicryl subcu was closed using 0 Vicryl skin was closed using staples.  The Praveena restore total knee dressing was applied this trachea may need had a good suction fit.  Patient was extubated taken the PACU in stable condition the knee immobilizer was applied.    DISCHARGE PLANNING:  Antibiotic duration: 24 hours  Weightbearing: Full weightbearing with knee immobilizer no flexion at this time.  Low-dose  Pain medication:.  Dressing care/ Wound VAC: Wound VAC is still large dressing removed  Ambulatory devices: Walker  Discharge to: Skilled nursing facility  Follow-up: In the office 1 week post operative.

## 2018-02-17 NOTE — H&P (Signed)
TOTAL KNEE REVISION ADMISSION H&P  Patient is being admitted for right revision total knee arthroplasty.  Subjective:  Chief Complaint:right knee pain.  HPI: [name], 52 y.o. female, has a history of pain and functional disability in the right knee(s) due to failed previous arthroplasty and patient has failed non-surgical conservative treatments for greater than 12 weeks to include activity modification. The indications for the revision of the total knee arthroplasty are loosening of one or more components and progressive or substantial perporsthetic bone loss. Onset of symptoms was gradual starting 8 years ago with gradually worsening course since that time.  Prior procedures on the right knee(s) include arthroplasty.  Patient currently rates pain in the right knee(s) at 8 out of 10 with activity. There is night pain, worsening of pain with activity and weight bearing, pain that interferes with activities of daily living, pain with passive range of motion, crepitus and joint swelling.  Patient has evidence of prosthetic loosening by imaging studies. This condition presents safety issues increasing the risk of falls. This patient has had proximal tibial fracture.  There is no current active infection.  Patient Active Problem List   Diagnosis Date Noted  . Chronic renal insufficiency, stage 2 (mild) 11/10/2017  . Acute CHF (congestive heart failure) ([place]) 11/10/2017  . Failed total knee arthroplasty, sequela 08/31/2017  . Lower extremity cellulitis 01/03/2013  . Diabetes mellitus ([place]) 01/03/2013  . OBESITY, NOS 11/12/2006  . TOBACCO DEPENDENCE 11/12/2006  . DEPRESSIVE DISORDER, NOS 11/12/2006  . HYPERTENSION, BENIGN SYSTEMIC 11/12/2006  . GASTROESOPHAGEAL REFLUX, NO ESOPHAGITIS 11/12/2006  . ACNE 11/12/2006  . OSTEOARTHRITIS, LOWER LEG 11/12/2006  . APNEA, SLEEP 11/12/2006   Past Medical History:  Diagnosis Date  . Arthritis    "hands, arms, feet, back, knees" (11/10/2017)  .  Cardiomyopathy [facility])    From Dr. Bonney Roussel office notes from 01/13/18  . CHF (congestive heart failure) ([place])   . Chronic lower back pain   . Diabetic neuropathy ([place])   . Family history of adverse reaction to anesthesia    Mother is hard to arouse and blood pressure drops  . GERD (gastroesophageal reflux disease)   . Hypertension   . Sleep apnea    "need new machine" (11/10/2017) - does not use a cpap  . Type II diabetes mellitus ([place])    no longer on medications (02/10/18)    Past Surgical History:  Procedure Laterality Date  . ANTERIOR CERVICAL DECOMP/DISCECTOMY FUSION  03/06/2010   C4-7 w/corpectomy/notes 03/17/2010  . CARPAL TUNNEL RELEASE Right   . HARDWARE REVISION  04/10/2010   cervical hardware revision screw replacement C4/notes 04/12/2010  . JOINT REPLACEMENT     bllateral total knees  . LAPAROSCOPIC CHOLECYSTECTOMY  1999  . LUMBAR LAMINECTOMY/DECOMPRESSION MICRODISCECTOMY  09/2010   Archie Endo 10/05/2010  . MULTIPLE EXTRACTIONS WITH ALVEOLOPLASTY [name] 01/19/2015   Procedure: MULTIPLE EXTRACTIONS Three, Four, Five, Seven, eight, nine, ten, eleven, twelve, fourteen, eighteen, twenty-one, twenty-eight, twenty-nine, thirty-one with Alveoloplasty.  ;  Surgeon: Diona Browner, DDS;  Location: [facility];  Service: Oral Surgery;  Laterality: [name];  . REPLACEMENT TOTAL KNEE BILATERAL Bilateral 10/2007-03/2008   "left-right"  . TOENAIL EXCISION Bilateral    great toes  . TONSILLECTOMY      Current Facility-Administered Medications  Medication Dose Route Frequency Provider Last Rate Last Dose  . ceFAZolin (ANCEF) 3 g in dextrose 5 % 50 mL IVPB  3 g Intravenous On Call to OR Newt Minion, MD       Current  Outpatient Medications  Medication Sig Dispense Refill Last Dose  . acetaminophen (TYLENOL) 650 MG CR tablet Take 650 mg by mouth every 8 (eight) hours as needed for pain.     Marland Kitchen aspirin EC 81 MG tablet Take 81 mg by mouth daily.     . furosemide (LASIX) 40 MG tablet Take 1 tablet (40 mg  total) by mouth daily. (Patient taking differently: Take 40 mg by mouth daily as needed (for fluid retention.). ) 30 tablet 0 Taking  . lisinopril-hydrochlorothiazide (PRINZIDE,ZESTORETIC) 20-25 MG tablet Take 1 tablet by mouth daily.  3   . rosuvastatin (CRESTOR) 20 MG tablet Take 20 mg by mouth daily.  3   . sacubitril-valsartan (ENTRESTO) 49-51 MG Take 1 tablet by mouth daily.      No Known Allergies  Social History   Tobacco Use  . Smoking status: Current Every Day Smoker    Packs/day: 0.30    Years: 35.00    Pack years: 10.50    Types: Cigarettes  . Smokeless tobacco: Never Used  . Tobacco comment: smoking 6 a day  Substance Use Topics  . Alcohol use: No    Family History  Problem Relation Age of Onset  . Kidney disease Mother   . Congestive Heart Failure Mother   . Hypertension Father       Review of Systems  All other systems reviewed and are negative.    Objective:  Physical Exam  Vital signs in last 24 hours:    Labs:  Estimated body mass index is 59.17 kg/m as calculated from the following:   Height as of 02/10/18: 5\' 3"  (1.6 m).   Weight as of 02/10/18: 334 lb (151.5 kg).  Imaging Review Plain radiographs demonstrate severe degenerative joint disease of the right knee(s). The overall alignment is mild varus.There is evidence of loosening of the tibial components. The bone quality appears to be unsatisfactory for age and reported activity level. There is subsidence of the tibial component.   Preoperative templating of the joint replacement has been completed, documented, and submitted to the Operating Room personnel in order to optimize intra-operative equipment management.   Assessment/Plan:  End stage arthritis, right knee(s) with failed previous arthroplasty.   The patient history, physical examination, clinical judgment of the provider and imaging studies are consistent with end stage degenerative joint disease of the right knee(s), previous total  knee arthroplasty. Revision total knee arthroplasty is deemed medically necessary. The treatment options including medical management, injection therapy, arthroscopy and revision arthroplasty were discussed at length. The risks and benefits of revision total knee arthroplasty were presented and reviewed. The risks due to aseptic loosening, infection, stiffness, patella tracking problems, thromboembolic complications and other imponderables were discussed. The patient acknowledged the explanation, agreed to proceed with the plan and consent was signed. Patient is being admitted for inpatient treatment for surgery, pain control, PT, OT, prophylactic antibiotics, VTE prophylaxis, progressive ambulation and ADL's and discharge planning.The patient is planning to be discharged to skilled nursing facility

## 2018-02-17 NOTE — Progress Notes (Addendum)
1515 Received pt from PACU, A&O x4. Right knee incision with wound vac dressing dry and intact. Medicated for pain. Pt has a healed pressure sores to buttocks. Abdominal folds are moist but no rashes.

## 2018-02-17 NOTE — Plan of Care (Signed)
  Problem: Education: Goal: Knowledge of General Education information will improve Outcome: Progressing   Problem: Pain Managment: Goal: General experience of comfort will improve Outcome: Progressing   Problem: Safety: Goal: Ability to remain free from injury will improve Outcome: Progressing   Problem: Elimination: Goal: Will not experience complications related to bowel motility Outcome: Progressing   Problem: Activity: Goal: Risk for activity intolerance will decrease Outcome: Progressing

## 2018-02-17 NOTE — Transfer of Care (Signed)
Immediate Anesthesia Transfer of Care Note  Patient: [name]  Procedure(s) Performed: RIGHT TOTAL KNEE REVISION (Right Knee)  Patient Location: PACU  Anesthesia Type:General and Regional  Level of Consciousness: awake, alert  and oriented  Airway & Oxygen Therapy: Patient Spontanous Breathing and Patient connected to nasal cannula oxygen  Post-op Assessment: Report given to RN and Post -op Vital signs reviewed and stable  Post vital signs: Reviewed and stable  Last Vitals:  Vitals Value Taken Time  BP 99/65 02/17/2018  1:03 PM  Temp    Pulse 90 02/17/2018  1:03 PM  Resp 16 02/17/2018  1:03 PM  SpO2 97 % 02/17/2018  1:03 PM  Vitals shown include unvalidated device data.  Last Pain:  Vitals:   02/17/18 0917  TempSrc:   PainSc: 8       Patients Stated Pain Goal: 6 (33/35/45 6256)  Complications: No apparent anesthesia complications

## 2018-02-17 NOTE — Anesthesia Procedure Notes (Signed)
Procedure Name: Intubation Date/Time: 02/17/2018 10:44 AM Performed by: Babs Bertin, CRNA Pre-anesthesia Checklist: Patient identified, Emergency Drugs available, Suction available and Patient being monitored Patient Re-evaluated:Patient Re-evaluated prior to induction Oxygen Delivery Method: Circle System Utilized Preoxygenation: Pre-oxygenation with 100% oxygen Induction Type: IV induction and Rapid sequence Laryngoscope Size: Mac and 3 Grade View: Grade I Tube type: Oral Tube size: 7.0 mm Number of attempts: 1 Airway Equipment and Method: Stylet and Oral airway Placement Confirmation: ETT inserted through vocal cords under direct vision,  positive ETCO2 and breath sounds checked- equal and bilateral Secured at: 21 cm Tube secured with: Tape Dental Injury: Teeth and Oropharynx as per pre-operative assessment

## 2018-02-17 NOTE — Evaluation (Signed)
Physical Therapy Evaluation Patient Details Name: [name] MRN: [id number] DOB: [birth year] Today's Date: 02/17/2018   History of Present Illness  Pt is a 52 y/o female s/p R total knee revision. PMH includes CHF, DM, OSA, HTN, obesity, and bilat TKA.   Clinical Impression  Pt is s/p surgery above with deficits below. Pt very limited by pain, so mobility limited to rolling for clean up. Required max A +2 to roll this session. Reviewed knee precautions and supine HEP as well. Pt reports her plan is to go to SNF at d/c. Will continue to follow acutely to maximize functional mobility independence and safety.     Follow Up Recommendations Follow surgeon's recommendation for DC plan and follow-up therapies;Supervision for mobility/OOB    Equipment Recommendations  None recommended by PT    Recommendations for Other Services OT consult     Precautions / Restrictions Precautions Precautions: Knee;Fall Precaution Booklet Issued: Yes (comment) Precaution Comments: Wound vac; REviewed knee precautions with pt. Also reports history of falls.  Required Braces or Orthoses: Knee Immobilizer - Right Knee Immobilizer - Right: Other (comment)(until discontinued ) Restrictions Weight Bearing Restrictions: Yes RLE Weight Bearing: Weight bearing as tolerated      Mobility  Bed Mobility Overal bed mobility: Needs Assistance Bed Mobility: Rolling Rolling: Max assist;+2 for physical assistance         General bed mobility comments: Max A +2 for rolling from side to side for clean up and replacement of linens. Pt declining further mobility secondary to pain.   Transfers                    Ambulation/Gait                Stairs            Wheelchair Mobility    Modified Rankin (Stroke Patients Only)       Balance                                             Pertinent Vitals/Pain Pain Assessment: 0-10 Pain Score: 10-Worst pain ever Pain  Location: R knee  Pain Descriptors / Indicators: Aching;Operative site guarding Pain Intervention(s): Limited activity within patient's tolerance;Monitored during session;Repositioned    Home Living Family/patient expects to be discharged to:: Skilled nursing facility                      Prior Function Level of Independence: Needs assistance   Gait / Transfers Assistance Needed: RW for mobility inside the home and then uses electric chair for outside of home.   ADL's / Homemaking Assistance Needed: Requires assist for bathing and ADL tasks.         Hand Dominance        Extremity/Trunk Assessment   Upper Extremity Assessment Upper Extremity Assessment: Defer to OT evaluation    Lower Extremity Assessment Lower Extremity Assessment: RLE deficits/detail RLE Deficits / Details: Sensory in tact. Deficits consistent with post op pain and weakness. Able to perofmr ther ex below.        Communication   Communication: No difficulties  Cognition Arousal/Alertness: Awake/alert Behavior During Therapy: WFL for tasks assessed/performed Overall Cognitive Status: Within Functional Limits for tasks assessed  General Comments General comments (skin integrity, edema, etc.): Pt's daughter present during session.     Exercises Total Joint Exercises Ankle Circles/Pumps: AROM;Right;15 reps Quad Sets: AROM;Right;10 reps Heel Slides: AAROM;Right;5 reps   Assessment/Plan    PT Assessment Patient needs continued PT services  PT Problem List Decreased strength;Decreased range of motion;Decreased activity tolerance;Decreased balance;Decreased mobility;Decreased knowledge of use of DME;Decreased knowledge of precautions;Pain       PT Treatment Interventions DME instruction;Gait training;Functional mobility training;Therapeutic activities;Therapeutic exercise;Balance training;Patient/family education    PT Goals (Current  goals can be found in the Care Plan section)  Acute Rehab PT Goals Patient Stated Goal: to decrease pain  PT Goal Formulation: With patient Time For Goal Achievement: 03/03/18 Potential to Achieve Goals: Good    Frequency 7X/week   Barriers to discharge        Co-evaluation               AM-PAC PT "6 Clicks" Daily Activity  Outcome Measure Difficulty turning over in bed (including adjusting bedclothes, sheets and blankets)?: Unable Difficulty moving from lying on back to sitting on the side of the bed? : Unable Difficulty sitting down on and standing up from a chair with arms (e.g., wheelchair, bedside commode, etc,.)?: Unable Help needed moving to and from a bed to chair (including a wheelchair)?: A Lot Help needed walking in hospital room?: Total Help needed climbing 3-5 steps with a railing? : Total 6 Click Score: 7    End of Session   Activity Tolerance: Patient limited by pain Patient left: in bed;with call bell/phone within reach;with family/visitor present Nurse Communication: Mobility status PT Visit Diagnosis: Other abnormalities of gait and mobility (R26.89);Pain;Unsteadiness on feet (R26.81);Muscle weakness (generalized) (M62.81);History of falling (Z91.81) Pain - Right/Left: Right Pain - part of body: Knee    Time: 3235-5732 PT Time Calculation (min) (ACUTE ONLY): 19 min   Charges:   PT Evaluation $PT Eval Low Complexity: 1 Low     PT G Codes:        Leighton Ruff, PT, DPT  Acute Rehabilitation Services  Pager: [phone]   Rudean Hitt 02/17/2018, 5:02 PM

## 2018-02-18 ENCOUNTER — Encounter (HOSPITAL_COMMUNITY): Payer: Self-pay | Admitting: Orthopedic Surgery

## 2018-02-18 LAB — GLUCOSE, CAPILLARY
GLUCOSE-CAPILLARY: 246 mg/dL — AB (ref 65–99)
GLUCOSE-CAPILLARY: 260 mg/dL — AB (ref 65–99)
GLUCOSE-CAPILLARY: 210 mg/dL — AB (ref 65–99)
Glucose-Capillary: 259 mg/dL — ABNORMAL HIGH (ref 65–99)

## 2018-02-18 NOTE — Progress Notes (Signed)
Patient ID: [name], female   DOB: May 02, 1966, 52 y.o.   MRN: [id number] Postoperative day 1 revision right total knee arthroplasty.  Patient has no complaints at this time her restorative wound VAC is functioning well.  Anticipate discharge to skilled nursing.

## 2018-02-18 NOTE — Evaluation (Signed)
Occupational Therapy Evaluation Patient Details Name: KASSIA DEMARINIS MRN: [id number] DOB: 09-19-1965 Today's Date: 02/18/2018    History of Present Illness Pt is a 52 y/o female s/p R total knee revision. PMH includes CHF, DM, OSA, HTN, obesity, and bilat TKA.    Clinical Impression   This 52 y/o female presents with the above. Pt reports uses RW at baseline for short distances and stand pivot transfers, reports was using power w/c for longer distances leading up to surgery. Pt reports she has an aide who assists daily for bathing/dressing ADLs. Pt requiring ModA+2 for bed mobility, MaxA+2 for sit<>stand transfers at [facility] this session with use of Stedy for transfer OOB to recliner. Pt currently requires MinA for UB ADLs, max-totalA for LB ADLs. Pt reporting plans for SNF level therapies at time of discharge. Feel this is appropriate given pt's current level of assist. Will continue to follow acutely to progress pt towards established OT goals.     Follow Up Recommendations  Follow surgeon's recommendation for DC plan and follow-up therapies;SNF;Supervision/Assistance - 24 hour    Equipment Recommendations  Other (comment)(TBD in next venue )           Precautions / Restrictions Precautions Precautions: Knee;Fall Precaution Comments: Wound VAC R knee Required Braces or Orthoses: Knee Immobilizer - Right Knee Immobilizer - Right: On except when in CPM;Other (comment)(on except when working with PT ) Restrictions Weight Bearing Restrictions: Yes RLE Weight Bearing: Weight bearing as tolerated      Mobility Bed Mobility Overal bed mobility: Needs Assistance Bed Mobility: Supine to Sit     Supine to sit: Mod assist;+2 for physical assistance;HOB elevated     General bed mobility comments: increased time and effort, assist with R LE movement off of bed, assist with trunk elevation  Transfers Overall transfer level: Needs assistance Equipment used: Rolling walker (2  wheeled) Transfers: Sit to/from Stand Sit to Stand: Max assist;+2 physical assistance         General transfer comment: increased time and effort, cueing for safety hand placement, attempted x1 from EOB with RW but pt unable to achieve upright standing. Used bari STEDY and pt able to achieve upright standing with max A x2    Balance                                           ADL either performed or assessed with clinical judgement   ADL Overall ADL's : Needs assistance/impaired Eating/Feeding: Modified independent;Sitting   Grooming: Set up;Sitting   Upper Body Bathing: Minimal assistance;Standing   Lower Body Bathing: Maximal assistance;+2 for physical assistance;+2 for safety/equipment;Sit to/from stand   Upper Body Dressing : Minimal assistance;Sitting   Lower Body Dressing: Maximal assistance;+2 for physical assistance;+2 for safety/equipment;Sit to/from stand       Toileting- Water quality scientist and Hygiene: Total assistance;+2 for physical assistance;+2 for safety/equipment;Sit to/from stand         General ADL Comments: pt completed sit<>stand at RW, though due to body habitus was not able to bring hips fully into RW; use of Bari-Stedy for additional sit<>stand and for transfer to recliner with maxA+2 for sit<>stand transfers                          Pertinent Vitals/Pain Pain Assessment: Faces Faces Pain Scale: Hurts even more Pain Location: R knee  Pain Descriptors / Indicators: Sore;Grimacing;Guarding Pain Intervention(s): Monitored during session;Repositioned     Hand Dominance     Extremity/Trunk Assessment Upper Extremity Assessment Upper Extremity Assessment: Generalized weakness   Lower Extremity Assessment Lower Extremity Assessment: Defer to PT evaluation       Communication Communication Communication: No difficulties   Cognition Arousal/Alertness: Awake/alert Behavior During Therapy: WFL for tasks  assessed/performed Overall Cognitive Status: Within Functional Limits for tasks assessed                                     General Comments       Exercises     Shoulder Instructions      Home Living Family/patient expects to be discharged to:: Skilled nursing facility Living Arrangements: Children;Other relatives                                      Prior Functioning/Environment Level of Independence: Needs assistance  Gait / Transfers Assistance Needed: RW for mobility inside the home and then uses electric chair for outside of home.  ADL's / Homemaking Assistance Needed: Requires assist for bathing and ADL tasks; has a CNA who assists 5 hrs/day            OT Problem List: Decreased strength;Decreased activity tolerance;Obesity;Pain;Decreased knowledge of use of DME or AE      OT Treatment/Interventions: Self-care/ADL training;DME and/or AE instruction;Balance training;Therapeutic exercise;Therapeutic activities;Patient/family education    OT Goals(Current goals can be found in the care plan section) Acute Rehab OT Goals Patient Stated Goal: to decrease pain  OT Goal Formulation: With patient Time For Goal Achievement: 03/04/18 Potential to Achieve Goals: Good  OT Frequency: Min 2X/week   Barriers to D/C:            Co-evaluation PT/OT/SLP Co-Evaluation/Treatment: Yes Reason for Co-Treatment: For patient/therapist safety;To address functional/ADL transfers PT goals addressed during session: Mobility/safety with mobility;Balance;Proper use of DME;Strengthening/ROM OT goals addressed during session: ADL's and self-care;Proper use of Adaptive equipment and DME      AM-PAC PT "6 Clicks" Daily Activity     Outcome Measure Help from another person eating meals?: None Help from another person taking care of personal grooming?: A Little Help from another person toileting, which includes using toliet, bedpan, or urinal?: Total Help from  another person bathing (including washing, rinsing, drying)?: A Lot Help from another person to put on and taking off regular upper body clothing?: A Little Help from another person to put on and taking off regular lower body clothing?: Total 6 Click Score: 14   End of Session Equipment Utilized During Treatment: Gait belt;Other (comment)(Stedy) Nurse Communication: Mobility status;Need for lift equipment  Activity Tolerance: Patient tolerated treatment well Patient left: in chair;with call bell/phone within reach;with family/visitor present  OT Visit Diagnosis: Muscle weakness (generalized) (M62.81);Unsteadiness on feet (R26.81)                Time: 7741-2878 OT Time Calculation (min): 43 min Charges:  OT General Charges $OT Visit: 1 Visit OT Evaluation $OT Eval Moderate Complexity: 1 Mod G-Codes:     Lou Cal, OT Pager [phone] 02/18/2018   Raymondo Band 02/18/2018, 4:44 PM

## 2018-02-18 NOTE — Progress Notes (Signed)
Physical Therapy Treatment Patient Details Name: [name] MRN: [id number] DOB: [birth year] Today's Date: 02/18/2018    History of Present Illness Pt is a 52 y/o female s/p R total knee revision. PMH includes CHF, DM, OSA, HTN, obesity, and bilat TKA.     PT Comments    Pt making fair progress and able to tolerate supine to sit EOB with mod A x2 to achieve. Pt also able to perform sit<>stand with bari STEDY and max A x2. Pt very limited secondary to pain and fatigue. Pt would continue to benefit from skilled physical therapy services at this time while admitted and after d/c to address the below listed limitations in order to improve overall safety and independence with functional mobility.    Follow Up Recommendations  SNF     Equipment Recommendations  None recommended by PT    Recommendations for Other Services OT consult     Precautions / Restrictions Precautions Precautions: Knee;Fall Precaution Comments: Wound VAC R knee Required Braces or Orthoses: Knee Immobilizer - Right Knee Immobilizer - Right: On except when in CPM;Other (comment)(on except when working with PT) Restrictions Weight Bearing Restrictions: Yes RLE Weight Bearing: Weight bearing as tolerated    Mobility  Bed Mobility Overal bed mobility: Needs Assistance Bed Mobility: Supine to Sit     Supine to sit: Mod assist;+2 for physical assistance;HOB elevated     General bed mobility comments: increased time and effort, assist with R LE movement off of bed, assist with trunk elevation  Transfers Overall transfer level: Needs assistance Equipment used: Rolling walker (2 wheeled) Transfers: Sit to/from Stand Sit to Stand: Max assist;+2 physical assistance         General transfer comment: increased time and effort, cueing for safety hand placement, attempted x1 from EOB with RW but pt unable to achieve upright standing. Used bari STEDY and pt able to achieve upright standing with max A  x2  Ambulation/Gait             General Gait Details: NT   Stairs             Wheelchair Mobility    Modified Rankin (Stroke Patients Only)       Balance                                            Cognition Arousal/Alertness: Awake/alert Behavior During Therapy: WFL for tasks assessed/performed Overall Cognitive Status: Within Functional Limits for tasks assessed                                        Exercises      General Comments        Pertinent Vitals/Pain Pain Assessment: Faces Faces Pain Scale: Hurts even more Pain Location: R knee  Pain Descriptors / Indicators: Sore;Grimacing;Guarding Pain Intervention(s): Monitored during session;Repositioned    Home Living                      Prior Function            PT Goals (current goals can now be found in the care plan section) Acute Rehab PT Goals PT Goal Formulation: With patient Time For Goal Achievement: 03/03/18 Potential to Achieve Goals: Good Progress towards PT goals: Progressing  toward goals    Frequency    7X/week      PT Plan Discharge plan needs to be updated    Co-evaluation PT/OT/SLP Co-Evaluation/Treatment: Yes Reason for Co-Treatment: For patient/therapist safety;To address functional/ADL transfers PT goals addressed during session: Mobility/safety with mobility;Balance;Proper use of DME;Strengthening/ROM        AM-PAC PT "6 Clicks" Daily Activity  Outcome Measure  Difficulty turning over in bed (including adjusting bedclothes, sheets and blankets)?: Unable Difficulty moving from lying on back to sitting on the side of the bed? : Unable Difficulty sitting down on and standing up from a chair with arms (e.g., wheelchair, bedside commode, etc,.)?: Unable Help needed moving to and from a bed to chair (including a wheelchair)?: Total Help needed walking in hospital room?: Total Help needed climbing 3-5 steps with a  railing? : Total 6 Click Score: 6    End of Session Equipment Utilized During Treatment: Gait belt Activity Tolerance: Patient limited by pain;Patient limited by fatigue Patient left: in chair;with call bell/phone within reach;with family/visitor present Nurse Communication: Mobility status;Need for lift equipment PT Visit Diagnosis: Other abnormalities of gait and mobility (R26.89);Pain;Unsteadiness on feet (R26.81);Muscle weakness (generalized) (M62.81);History of falling (Z91.81) Pain - Right/Left: Right Pain - part of body: Knee     Time: 1829-9371 PT Time Calculation (min) (ACUTE ONLY): 43 min  Charges:  $Therapeutic Activity: 23-37 mins                    G Codes:       [place], PT, [place] 02/18/2018, 4:18 PM

## 2018-02-18 NOTE — Progress Notes (Signed)
Inpatient Diabetes Program Recommendations  AACE/ADA: New Consensus Statement on Inpatient Glycemic Control (2015)  Target Ranges:  Prepandial:   less than 140 mg/dL      Peak postprandial:   less than 180 mg/dL (1-2 hours)      Critically ill patients:  140 - 180 mg/dL   Lab Results  Component Value Date   GLUCAP 259 (H) 02/18/2018   HGBA1C 11.3 (H) 01/03/2013    Review of Glycemic ControlResults for CAMEREN, ODWYER (MRN [id number]) as of 02/18/2018 11:48  Ref. Range 02/17/2018 13:43 02/17/2018 16:59 02/18/2018 06:17  Glucose-Capillary Latest Ref Range: 65 - 99 mg/dL 212 (H) 288 (H) 259 (H)   Diabetes history: Type 2 DM  Outpatient Diabetes medications:  None Current orders for Inpatient glycemic control:  Novolog moderate tid with meals Inpatient Diabetes Program Recommendations:   Please check A1C.  Also consider adding Levemir 20 units daily while in the hospital.    Thanks,  Adah Perl, RN, BC-ADM Inpatient Diabetes Coordinator Pager [phone] (8a-5p)

## 2018-02-19 LAB — GLUCOSE, CAPILLARY
Glucose-Capillary: 196 mg/dL — ABNORMAL HIGH (ref 65–99)
Glucose-Capillary: 239 mg/dL — ABNORMAL HIGH (ref 65–99)
Glucose-Capillary: 221 mg/dL — ABNORMAL HIGH (ref 65–99)
Glucose-Capillary: 190 mg/dL — ABNORMAL HIGH (ref 65–99)

## 2018-02-19 MED ORDER — OXYCODONE-ACETAMINOPHEN 5-325 MG PO TABS: 1.0000 | ORAL_TABLET | ORAL | 0 refills | Status: DC | PRN

## 2018-02-19 NOTE — Progress Notes (Signed)
Patient ID: [name], female   DOB: 1966/07/24, 52 y.o.   MRN: [id number] Plan for discharge to skilled nursing facility on Saturday.  Prescriptions and orders written.

## 2018-02-19 NOTE — Progress Notes (Signed)
Physical Therapy Treatment Patient Details Name: [name] MRN: [id number] DOB: Mar 11, 1966 Today's Date: 02/19/2018    History of Present Illness Pt is a 52 y/o female s/p R total knee revision. PMH includes CHF, DM, OSA, HTN, obesity, and bilat TKA.     PT Comments    Pt making fair progress with functional mobility. Pt able to perform sit<>stand from EOB x2 with min A x2 and use of STEDY. Pt tolerated standing for two minutes each time. Pt would continue to benefit from skilled physical therapy services at this time while admitted and after d/c to address the below listed limitations in order to improve overall safety and independence with functional mobility.    Follow Up Recommendations  SNF     Equipment Recommendations  None recommended by PT    Recommendations for Other Services       Precautions / Restrictions Precautions Precautions: Knee;Fall Precaution Comments: Wound VAC R knee Required Braces or Orthoses: Knee Immobilizer - Right Knee Immobilizer - Right: On except when in CPM;Other (comment)(on except when working with PT) Restrictions Weight Bearing Restrictions: Yes RLE Weight Bearing: Weight bearing as tolerated    Mobility  Bed Mobility Overal bed mobility: Needs Assistance Bed Mobility: Supine to Sit;Sit to Supine     Supine to sit: Mod assist;+2 for physical assistance;HOB elevated Sit to supine: Max assist;+2 for physical assistance   General bed mobility comments: increased time and effort, assist with R LE movement off of and back onto bed, assist with trunk elevation  Transfers Overall transfer level: Needs assistance   Transfers: Sit to/from Stand Sit to Stand: Min assist;+2 physical assistance;+2 safety/equipment         General transfer comment: increased time and effort, cueing for technique and safety, assistance to power into standing and with movement of adipose tissue at hips to position into STEDY. Pt performed x2 from  EOB  Ambulation/Gait                 Stairs             Wheelchair Mobility    Modified Rankin (Stroke Patients Only)       Balance Overall balance assessment: Needs assistance Sitting-balance support: Feet supported Sitting balance-Leahy Scale: Fair     Standing balance support: During functional activity;Bilateral upper extremity supported Standing balance-Leahy Scale: Poor Standing balance comment: reliant on bilateral UEs on Stedy                            Cognition Arousal/Alertness: Awake/alert Behavior During Therapy: WFL for tasks assessed/performed Overall Cognitive Status: Within Functional Limits for tasks assessed                                        Exercises Total Joint Exercises Ankle Circles/Pumps: AROM;Right;20 reps;Seated Long Arc Quad: AAROM;Right;10 reps;Seated Knee Flexion: AAROM;Right;10 reps;Seated    General Comments        Pertinent Vitals/Pain Pain Assessment: Faces Faces Pain Scale: Hurts whole lot Pain Location: R knee  Pain Descriptors / Indicators: Sore;Grimacing;Guarding Pain Intervention(s): Monitored during session;Repositioned    Home Living                      Prior Function            PT Goals (current goals can now be  found in the care plan section) Acute Rehab PT Goals PT Goal Formulation: With patient Time For Goal Achievement: 03/03/18 Potential to Achieve Goals: Good Progress towards PT goals: Progressing toward goals    Frequency    7X/week      PT Plan Current plan remains appropriate    Co-evaluation              AM-PAC PT "6 Clicks" Daily Activity  Outcome Measure  Difficulty turning over in bed (including adjusting bedclothes, sheets and blankets)?: Unable Difficulty moving from lying on back to sitting on the side of the bed? : Unable Difficulty sitting down on and standing up from a chair with arms (e.g., wheelchair, bedside commode,  etc,.)?: Unable Help needed moving to and from a bed to chair (including a wheelchair)?: A Lot Help needed walking in hospital room?: Total Help needed climbing 3-5 steps with a railing? : Total 6 Click Score: 7    End of Session Equipment Utilized During Treatment: Gait belt Activity Tolerance: Patient limited by pain;Patient limited by fatigue Patient left: in bed;with call bell/phone within reach;with family/visitor present Nurse Communication: Mobility status;Need for lift equipment PT Visit Diagnosis: Other abnormalities of gait and mobility (R26.89);Pain;Unsteadiness on feet (R26.81);Muscle weakness (generalized) (M62.81);History of falling (Z91.81) Pain - Right/Left: Right Pain - part of body: Knee     Time: 8381-8403 PT Time Calculation (min) (ACUTE ONLY): 49 min  Charges:  $Therapeutic Activity: 38-52 mins                    G Codes:       [place], [place], [place] 02/19/2018, 5:15 PM

## 2018-02-19 NOTE — Care Management Important Message (Signed)
Important Message  Patient Details  Name: DAJAHNAE VONDRA MRN: [id number] Date of Birth: Jan 24, 1966   Medicare Important Message Given:  Yes    Megan Hayduk 02/19/2018, 4:05 PM

## 2018-02-19 NOTE — Social Work (Signed)
CSW discussed SNF offers with patient. Pt accepted SNF bed from [facility]. CSW confirmed bed offer with SNF and they will get Insurance Auth initiated and CSW sent clinicals.  CSW will continue to follow for disposition.  Elissa Hefty, LCSW Clinical Social Worker [phone]

## 2018-02-19 NOTE — NC FL2 (Signed)
[place] MEDICAID FL2 LEVEL OF CARE SCREENING TOOL     IDENTIFICATION  Patient Name: [name] Birthdate: 08-05-1966 Sex: female Admission Date (Current Location): 02/17/2018  [facility] and [place] Number:  Herbalist and Address:  [facility] [place], [place] [address] [place], [place] [address]      Provider Number: [id number]  Attending Physician Name and Address:  Newt Minion, MD  Relative Name and Phone Number:  Lucious Groves, daughter, [phone]    Current Level of Care: Hospital Recommended Level of Care: [place] Prior Approval Number:    Date Approved/Denied:   PASRR Number: [id number]  Discharge Plan: SNF    Current Diagnoses: Patient Active Problem List   Diagnosis Date Noted  . S/P revision of total knee 02/17/2018  . Failed total right knee replacement ([place])   . Chronic renal insufficiency, stage 2 (mild) 11/10/2017  . Acute CHF (congestive heart failure) ([place]) 11/10/2017  . Failed total knee arthroplasty, sequela 08/31/2017  . Lower extremity cellulitis 01/03/2013  . Diabetes mellitus ([place]) 01/03/2013  . Morbid (severe) obesity due to excess calories ([place]) 11/12/2006  . TOBACCO DEPENDENCE 11/12/2006  . DEPRESSIVE DISORDER, NOS 11/12/2006  . HYPERTENSION, BENIGN SYSTEMIC 11/12/2006  . GASTROESOPHAGEAL REFLUX, NO ESOPHAGITIS 11/12/2006  . ACNE 11/12/2006  . OSTEOARTHRITIS, LOWER LEG 11/12/2006  . APNEA, SLEEP 11/12/2006    Orientation RESPIRATION BLADDER Height & Weight     Self, Time, Situation, Place  Normal Incontinent Weight: (!) 334 lb (151.5 kg) Height:     BEHAVIORAL SYMPTOMS/MOOD NEUROLOGICAL BOWEL NUTRITION STATUS      Continent Diet(Room Air)  AMBULATORY STATUS COMMUNICATION OF NEEDS Skin   Extensive Assist(ModA+2) Verbally Surgical wounds, Wound Vac                       Personal Care Assistance Level of Assistance  Bathing, Feeding, Dressing Bathing Assistance: Maximum  assistance Feeding assistance: Limited assistance Dressing Assistance: Maximum assistance     Functional Limitations Info  Sight, Hearing, Speech Sight Info: Adequate Hearing Info: Adequate Speech Info: Adequate    SPECIAL CARE FACTORS FREQUENCY  PT (By licensed PT), OT (By licensed OT)     PT Frequency: 7x week OT Frequency: 2x week            Contractures      Additional Factors Info  Code Status, Allergies, Insulin Sliding Scale Code Status Info: Full  Allergies Info: No Known Allergies   Insulin Sliding Scale Info: Insulin Daily       Current Medications (02/19/2018):  This is the current hospital active medication list Current Facility-Administered Medications  Medication Dose Route Frequency Provider Last Rate Last Dose  . 0.9 %  sodium chloride infusion   Intravenous Continuous Newt Minion, MD 10 mL/hr at 02/17/18 1750    . acetaminophen (TYLENOL) tablet 325-650 mg  325-650 mg Oral Q6H PRN Newt Minion, MD      . aspirin EC tablet 325 mg  325 mg Oral Q breakfast Newt Minion, MD   325 mg at 02/19/18 0857  . bisacodyl (DULCOLAX) suppository 10 mg  10 mg Rectal Daily PRN Newt Minion, MD      . docusate sodium (COLACE) capsule 100 mg  100 mg Oral BID Newt Minion, MD   100 mg at 02/19/18 0857  . lisinopril (PRINIVIL,ZESTRIL) tablet 20 mg  20 mg Oral Daily Newt Minion, MD   20 mg at  02/19/18 0857   And  . hydrochlorothiazide (HYDRODIURIL) tablet 25 mg  25 mg Oral Daily Newt Minion, MD   25 mg at 02/19/18 0858  . HYDROmorphone (DILAUDID) injection 0.5-1 mg  0.5-1 mg Intravenous Q4H PRN Newt Minion, MD   1 mg at 02/18/18 2226  . insulin aspart (novoLOG) injection 0-15 Units  0-15 Units Subcutaneous TID WC Newt Minion, MD   3 Units at 02/19/18 0857  . magnesium citrate solution 1 Bottle  1 Bottle Oral Once PRN Newt Minion, MD      . menthol-cetylpyridinium (CEPACOL) lozenge 3 mg  1 lozenge Oral PRN Newt Minion, MD       Or  . phenol  (CHLORASEPTIC) mouth spray 1 spray  1 spray Mouth/Throat PRN Newt Minion, MD      . metoCLOPramide (REGLAN) tablet 5-10 mg  5-10 mg Oral Q8H PRN Newt Minion, MD   10 mg at 02/19/18 0314   Or  . metoCLOPramide (REGLAN) injection 5-10 mg  5-10 mg Intravenous Q8H PRN Newt Minion, MD      . nicotine (NICODERM CQ - dosed in mg/24 hr) patch 7 mg  7 mg Transdermal Daily Mcarthur Rossetti, MD   7 mg at 02/18/18 0846  . ondansetron (ZOFRAN) tablet 4 mg  4 mg Oral Q6H PRN Newt Minion, MD       Or  . ondansetron [facility]) injection 4 mg  4 mg Intravenous Q6H PRN Newt Minion, MD      . oxyCODONE (Oxy IR/ROXICODONE) immediate release tablet 10-15 mg  10-15 mg Oral Q4H PRN Newt Minion, MD   15 mg at 02/19/18 0903  . oxyCODONE (Oxy IR/ROXICODONE) immediate release tablet 5-10 mg  5-10 mg Oral Q4H PRN Newt Minion, MD   10 mg at 02/17/18 1609  . pneumococcal 23 valent vaccine (PNU-IMMUNE) injection 0.5 mL  0.5 mL Intramuscular Tomorrow-1000 Newt Minion, MD      . polyethylene glycol (MIRALAX / GLYCOLAX) packet 17 g  17 g Oral Daily PRN Newt Minion, MD   17 g at 02/19/18 0602  . rosuvastatin (CRESTOR) tablet 20 mg  20 mg Oral q1800 Newt Minion, MD   20 mg at 02/18/18 1712     Discharge Medications: Please see discharge summary for a list of discharge medications.  Relevant Imaging Results:  Relevant Lab Results:   Additional Information SS#: [id number]  Normajean Baxter, LCSW

## 2018-02-19 NOTE — Progress Notes (Signed)
Patient ID: [name], female   DOB: 04/21/1966, 52 y.o.   MRN: [id number] Anticipated LOS equal to or greater than 2 midnights due to - Age 68 and older with one or more of the following:  - Obesity  - Expected need for hospital services (PT, OT, Nursing) required for safe  discharge  - Anticipated need for postoperative skilled nursing care or inpatient rehab  - Active co-morbidities: Diabetes and Morbid obesity OR   - Unanticipated findings during/Post Surgery: Slow post-op progression: GI, pain control, mobility  - Patient is a high risk of re-admission due to: None

## 2018-02-19 NOTE — Discharge Summary (Signed)
Discharge Diagnoses:  Active Problems:   Morbid (severe) obesity due to excess calories ([place])   Failed total right knee replacement ([place])   S/P revision of total knee   Surgeries: Procedure(s): RIGHT TOTAL KNEE REVISION on 02/17/2018    Consultants:   Discharged Condition: Improved  Hospital Course: [name] is an 52 y.o. female who was admitted 02/17/2018 with a chief complaint of failed right total knee arthroplasty, with a final diagnosis of Failed Right Total Knee Arthroplasty.  Patient was brought to the operating room on 02/17/2018 and underwent Procedure(s): RIGHT TOTAL KNEE REVISION.    Patient was given perioperative antibiotics:  Anti-infectives (From admission, onward)   Start     Dose/Rate Route Frequency Ordered Stop   02/17/18 1730  ceFAZolin (ANCEF) IVPB 2g/100 mL premix     2 g 200 mL/hr over 30 Minutes Intravenous Every 6 hours 02/17/18 1527 02/18/18 0032   02/17/18 0900  ceFAZolin (ANCEF) 3 g in dextrose 5 % 50 mL IVPB     3 g 100 mL/hr over 30 Minutes Intravenous On call to O.R. 02/16/18 0915 02/17/18 1047    .  Patient was given sequential compression devices, early ambulation, and aspirin for DVT prophylaxis.  Recent vital signs:  Patient Vitals for the past 24 hrs:  BP Temp Temp src Pulse Resp SpO2  02/19/18 0540 133/71 99.1 F (37.3 C) Oral 86 20 100 %  02/18/18 2239 112/74 99 F (37.2 C) Oral 82 20 99 %  02/18/18 1507 105/61 99.4 F (37.4 C) Oral 90 16 92 %  .  Recent laboratory studies: No results found.  Discharge Medications:   Allergies as of 02/19/2018   No Known Allergies     Medication List    TAKE these medications   acetaminophen 650 MG CR tablet Commonly known as:  TYLENOL Take 650 mg by mouth every 8 (eight) hours as needed for pain.   aspirin EC 81 MG tablet Take 81 mg by mouth daily.   ENTRESTO 49-51 MG Generic drug:  sacubitril-valsartan Take 1 tablet by mouth daily.   furosemide 40 MG tablet Commonly known as:   LASIX Take 1 tablet (40 mg total) by mouth daily. What changed:    when to take this  reasons to take this   lisinopril-hydrochlorothiazide 20-25 MG tablet Commonly known as:  PRINZIDE,ZESTORETIC Take 1 tablet by mouth daily.   oxyCODONE-acetaminophen 5-325 MG tablet Commonly known as:  PERCOCET/ROXICET Take 1 tablet by mouth every 4 (four) hours as needed for severe pain.   rosuvastatin 20 MG tablet Commonly known as:  CRESTOR Take 20 mg by mouth daily.       Diagnostic Studies: No results found.  Patient benefited maximally from their hospital stay and there were no complications.     Disposition: Discharge disposition: 01-Home or Self Care      Discharge Instructions    Call MD / Call 911   Complete by:  As directed    If you experience chest pain or shortness of breath, CALL 911 and be transported to the hospital emergency room.  If you develope a fever above 101 F, pus (white drainage) or increased drainage or redness at the wound, or calf pain, call your surgeon's office.   Constipation Prevention   Complete by:  As directed    Drink plenty of fluids.  Prune juice may be helpful.  You may use a stool softener, such as Colace (over the counter) 100 mg twice a day.  Use MiraLax (over the counter) for constipation as needed.   Diet - low sodium heart healthy   Complete by:  As directed    Increase activity slowly as tolerated   Complete by:  As directed    Negative Pressure Wound Therapy - Incisional   Complete by:  As directed    Continue wound VAC for 2 weeks.     Follow-up Information    Newt Minion, MD In 2 weeks.   Specialty:  Orthopedic Surgery Contact information: [address] [place] [address] [phone]            Signed: Newt Minion 02/19/2018, 7:04 AM

## 2018-02-19 NOTE — Clinical Social Work Note (Signed)
Clinical Social Work Assessment  Patient Details  Name: [name] MRN: [id number] Date of Birth: [birth year]  Date of referral:  02/19/18               Reason for consult:  Facility Placement                Permission sought to share information with:  Facility Art therapist granted to share information::  No  Name::        Agency::  SNF  Relationship::     Contact Information:     Housing/Transportation Living arrangements for the past 2 months:  Single Family Home Source of Information:  Patient Patient Interpreter Needed:  None Criminal Activity/Legal Involvement Pertinent to Current Situation/Hospitalization:  No - Comment as needed Significant Relationships:  Adult Children, Other Family Members Lives with:  Adult Children Do you feel safe going back to the place where you live?  No Need for family participation in patient care:  No (Coment)  Care giving concerns:  Pt resides with family and will need skilled nursing.   Social Worker assessment / plan:  CSW met with patient at bedside to discuss SNF placement. Pt in agreement as she had a previous knee and realizes that she will need rehabilitative therapies in a facility. Pt uses a walker and wheelchair at baseline. When pt was home she had assistance from family during the day and home health in the evening due to assistance needed with ADL's. CSW explained the SNF process, placement and insurance auth. CSW obtained permission to send to SNF's in [employer]  CSW will f/u for disposition.   Employment status:  Disabled (Comment on whether or not currently receiving Disability) Insurance information:  Programmer, applications PT Recommendations:  [place] / Referral to community resources:  [place]  Patient/Family's Response to care:  Economist.  Patient/Family's Understanding of and Emotional Response to Diagnosis, Current Treatment, and Prognosis:   Patient has good understanding of diagnosis as she has had the left knee repaired in the past. Pt hopeful that she will get better, continue her health goals and return home to family. Pt indicated that she hopes to dance again and play with her grandson [name]. CSW encouraged her progress and provided supportive feedback.   CSW will f/u for disposition.  Emotional Assessment Appearance:  Appears stated age Attitude/Demeanor/Rapport:  (Cooperative) Affect (typically observed):  Accepting, Appropriate Orientation:  Oriented to Self, Oriented to Place, Oriented to  Time, Oriented to Situation Alcohol / Substance use:  Not Applicable Psych involvement (Current and /or in the community):  No (Comment)  Discharge Needs  Concerns to be addressed:  Discharge Planning Concerns Readmission within the last 30 days:  No Current discharge risk:  Dependent with Mobility, Physical Impairment Barriers to Discharge:  Continued Medical Work up   [employer], LCSW 02/19/2018, 11:08 AM

## 2018-02-20 DIAGNOSIS — L02415 Cutaneous abscess of right lower limb: Secondary | ICD-10-CM | POA: Diagnosis not present

## 2018-02-20 DIAGNOSIS — I509 Heart failure, unspecified: Secondary | ICD-10-CM | POA: Diagnosis not present

## 2018-02-20 DIAGNOSIS — L97819 Non-pressure chronic ulcer of other part of right lower leg with unspecified severity: Secondary | ICD-10-CM | POA: Diagnosis not present

## 2018-02-20 DIAGNOSIS — I10 Essential (primary) hypertension: Secondary | ICD-10-CM | POA: Diagnosis not present

## 2018-02-20 DIAGNOSIS — E1140 Type 2 diabetes mellitus with diabetic neuropathy, unspecified: Secondary | ICD-10-CM | POA: Diagnosis not present

## 2018-02-20 DIAGNOSIS — Z7401 Bed confinement status: Secondary | ICD-10-CM | POA: Diagnosis not present

## 2018-02-20 DIAGNOSIS — R609 Edema, unspecified: Secondary | ICD-10-CM | POA: Diagnosis not present

## 2018-02-20 DIAGNOSIS — M6281 Muscle weakness (generalized): Secondary | ICD-10-CM | POA: Diagnosis not present

## 2018-02-20 DIAGNOSIS — R5381 Other malaise: Secondary | ICD-10-CM | POA: Diagnosis not present

## 2018-02-20 DIAGNOSIS — E119 Type 2 diabetes mellitus without complications: Secondary | ICD-10-CM | POA: Diagnosis not present

## 2018-02-20 DIAGNOSIS — M79604 Pain in right leg: Secondary | ICD-10-CM | POA: Diagnosis not present

## 2018-02-20 DIAGNOSIS — M2550 Pain in unspecified joint: Secondary | ICD-10-CM | POA: Diagnosis not present

## 2018-02-20 DIAGNOSIS — L299 Pruritus, unspecified: Secondary | ICD-10-CM | POA: Diagnosis not present

## 2018-02-20 DIAGNOSIS — R2681 Unsteadiness on feet: Secondary | ICD-10-CM | POA: Diagnosis not present

## 2018-02-20 DIAGNOSIS — G8929 Other chronic pain: Secondary | ICD-10-CM | POA: Diagnosis not present

## 2018-02-20 DIAGNOSIS — K219 Gastro-esophageal reflux disease without esophagitis: Secondary | ICD-10-CM | POA: Diagnosis not present

## 2018-02-20 DIAGNOSIS — M25561 Pain in right knee: Secondary | ICD-10-CM | POA: Diagnosis not present

## 2018-02-20 DIAGNOSIS — E785 Hyperlipidemia, unspecified: Secondary | ICD-10-CM | POA: Diagnosis not present

## 2018-02-20 DIAGNOSIS — T84012S Broken internal right knee prosthesis, sequela: Secondary | ICD-10-CM | POA: Diagnosis not present

## 2018-02-20 DIAGNOSIS — M79661 Pain in right lower leg: Secondary | ICD-10-CM | POA: Diagnosis not present

## 2018-02-20 DIAGNOSIS — Z23 Encounter for immunization: Secondary | ICD-10-CM | POA: Diagnosis not present

## 2018-02-20 DIAGNOSIS — N760 Acute vaginitis: Secondary | ICD-10-CM | POA: Diagnosis not present

## 2018-02-20 DIAGNOSIS — Z96651 Presence of right artificial knee joint: Secondary | ICD-10-CM | POA: Diagnosis not present

## 2018-02-20 DIAGNOSIS — R262 Difficulty in walking, not elsewhere classified: Secondary | ICD-10-CM | POA: Diagnosis not present

## 2018-02-20 DIAGNOSIS — Y792 Prosthetic and other implants, materials and accessory orthopedic devices associated with adverse incidents: Secondary | ICD-10-CM | POA: Diagnosis not present

## 2018-02-20 DIAGNOSIS — Z9889 Other specified postprocedural states: Secondary | ICD-10-CM | POA: Diagnosis not present

## 2018-02-20 LAB — GLUCOSE, CAPILLARY
Glucose-Capillary: 159 mg/dL — ABNORMAL HIGH (ref 65–99)
Glucose-Capillary: 187 mg/dL — ABNORMAL HIGH (ref 65–99)

## 2018-02-20 NOTE — Progress Notes (Signed)
Physical Therapy Treatment Patient Details Name: [name] MRN: [id number] DOB: [birth year] Today's Date: 02/20/2018    History of Present Illness Pt is a 52 y/o female s/p R total knee revision. PMH includes CHF, DM, OSA, HTN, obesity, and bilat TKA.     PT Comments    Focus of session was on R LE therex (please see below for details). Pt would continue to benefit from skilled physical therapy services at this time while admitted and after d/c to address the below listed limitations in order to improve overall safety and independence with functional mobility.    Follow Up Recommendations  SNF     Equipment Recommendations  None recommended by PT    Recommendations for Other Services       Precautions / Restrictions Precautions Precautions: Knee;Fall Precaution Booklet Issued: Yes (comment) Precaution Comments: Wound VAC R knee Required Braces or Orthoses: Knee Immobilizer - Right Knee Immobilizer - Right: On except when in CPM;Other (comment)(on except when working with PT) Restrictions Weight Bearing Restrictions: Yes RLE Weight Bearing: Weight bearing as tolerated    Mobility  Bed Mobility               General bed mobility comments: focus on therex as pt soon to d/c to SNF  Transfers                    Ambulation/Gait                 Stairs             Wheelchair Mobility    Modified Rankin (Stroke Patients Only)       Balance                                            Cognition Arousal/Alertness: Awake/alert Behavior During Therapy: WFL for tasks assessed/performed Overall Cognitive Status: Within Functional Limits for tasks assessed                                        Exercises Total Joint Exercises Ankle Circles/Pumps: AROM;AAROM;Right;20 reps;Supine Quad Sets: AROM;Strengthening;Right;20 reps;Supine Gluteal Sets: AROM;Both;20 reps;Supine Heel Slides: AAROM;AROM;Right;20  reps;Supine Hip ABduction/ADduction: AAROM;Right;10 reps;Supine Straight Leg Raises: AAROM;Right;10 reps;Supine    General Comments        Pertinent Vitals/Pain Pain Assessment: Faces Faces Pain Scale: Hurts even more Pain Location: R knee  Pain Descriptors / Indicators: Sore;Grimacing;Guarding Pain Intervention(s): Monitored during session;Repositioned    Home Living                      Prior Function            PT Goals (current goals can now be found in the care plan section) Acute Rehab PT Goals PT Goal Formulation: With patient Time For Goal Achievement: 03/03/18 Potential to Achieve Goals: Good Progress towards PT goals: Progressing toward goals    Frequency    7X/week      PT Plan Current plan remains appropriate    Co-evaluation              AM-PAC PT "6 Clicks" Daily Activity  Outcome Measure  Difficulty turning over in bed (including adjusting bedclothes, sheets and blankets)?: Unable Difficulty moving from lying on back to  sitting on the side of the bed? : Unable Difficulty sitting down on and standing up from a chair with arms (e.g., wheelchair, bedside commode, etc,.)?: Unable Help needed moving to and from a bed to chair (including a wheelchair)?: A Lot Help needed walking in hospital room?: Total Help needed climbing 3-5 steps with a railing? : Total 6 Click Score: 7    End of Session   Activity Tolerance: Patient limited by pain;Patient limited by fatigue Patient left: in bed;with call bell/phone within reach Nurse Communication: Mobility status;Need for lift equipment PT Visit Diagnosis: Other abnormalities of gait and mobility (R26.89);Pain;Unsteadiness on feet (R26.81);Muscle weakness (generalized) (M62.81);History of falling (Z91.81) Pain - Right/Left: Right Pain - part of body: Knee     Time: 1210-1224 PT Time Calculation (min) (ACUTE ONLY): 14 min  Charges:  $Therapeutic Exercise: 8-22 mins                    G  Codes:       [place], [place], [place] 02/20/2018, 1:28 PM

## 2018-02-20 NOTE — Progress Notes (Signed)
Patient ID: [name], female   DOB: 09/22/1965, 52 y.o.   MRN: [id number] Postoperative day 3 right total knee arthroplasty.  The wound VAC is clean and dry with the customized restore dressing.  Plan for discharge to skilled nursing will discharge when insurance approves.  Prescriptions on the chart for discharge.

## 2018-02-20 NOTE — Progress Notes (Signed)
Orthopedic Tech Progress Note Patient Details:  [name] 1965-10-01 [id number]  Ortho Devices Type of Ortho Device: Knee Immobilizer Ortho Device/Splint Location: Replacement knee immobilizer Ortho Device/Splint Interventions: Application   Post Interventions Patient Tolerated: Well Instructions Provided: Care of device   [name] 02/20/2018, 1:34 PM

## 2018-02-20 NOTE — Progress Notes (Signed)
All questions and concerns addressed. Called facility and gave report to [place], Pt to discharge with belongings via [place].

## 2018-02-20 NOTE — Progress Notes (Signed)
     Patient is set to discharge to [facility] SNF today. Patient aware and will contact family. Discharge packet given to [facility], . [facility] called for transport. Please call to run report [phone].  Mahamad [employer] LCSWA [phone]

## 2018-02-20 NOTE — Plan of Care (Signed)

## 2018-02-22 ENCOUNTER — Ambulatory Visit: Payer: Medicare Other | Admitting: Registered"

## 2018-02-22 ENCOUNTER — Telehealth (INDEPENDENT_AMBULATORY_CARE_PROVIDER_SITE_OTHER): Payer: Self-pay

## 2018-02-22 DIAGNOSIS — I509 Heart failure, unspecified: Secondary | ICD-10-CM | POA: Diagnosis not present

## 2018-02-22 DIAGNOSIS — I10 Essential (primary) hypertension: Secondary | ICD-10-CM | POA: Diagnosis not present

## 2018-02-22 DIAGNOSIS — R609 Edema, unspecified: Secondary | ICD-10-CM | POA: Diagnosis not present

## 2018-02-22 DIAGNOSIS — M25561 Pain in right knee: Secondary | ICD-10-CM | POA: Diagnosis not present

## 2018-02-22 NOTE — Telephone Encounter (Signed)
Ainsley Spinner RN with [facility] called stating she needed clarification on wound orders. She stated it appears patient has a wound vac and also a surgical dressing. Please call her back to advise. [place]

## 2018-02-22 NOTE — Telephone Encounter (Signed)
Called and sw nurse to advise that the vac is to stay on for two  Weeks and will remove in the office at first post op.

## 2018-02-23 ENCOUNTER — Other Ambulatory Visit: Payer: Self-pay

## 2018-02-23 DIAGNOSIS — M25561 Pain in right knee: Secondary | ICD-10-CM | POA: Diagnosis not present

## 2018-02-23 DIAGNOSIS — T84012S Broken internal right knee prosthesis, sequela: Secondary | ICD-10-CM | POA: Diagnosis not present

## 2018-02-23 DIAGNOSIS — R262 Difficulty in walking, not elsewhere classified: Secondary | ICD-10-CM | POA: Diagnosis not present

## 2018-02-23 DIAGNOSIS — I10 Essential (primary) hypertension: Secondary | ICD-10-CM | POA: Diagnosis not present

## 2018-02-23 DIAGNOSIS — M6281 Muscle weakness (generalized): Secondary | ICD-10-CM | POA: Diagnosis not present

## 2018-02-23 DIAGNOSIS — R5381 Other malaise: Secondary | ICD-10-CM | POA: Diagnosis not present

## 2018-02-23 NOTE — Patient Outreach (Signed)
[place]) Care Management  02/23/2018  [name] [birth year] [id number]   Medication Adherence call to [name] left a message for patient to call back patient is due on Lisinopril/Hctz 20/25. [name] is showing past due under [place].  [facility] Direct Dial [phone]  Fax [phone] [email or web address]

## 2018-02-25 ENCOUNTER — Telehealth (INDEPENDENT_AMBULATORY_CARE_PROVIDER_SITE_OTHER): Payer: Self-pay

## 2018-02-25 DIAGNOSIS — L97819 Non-pressure chronic ulcer of other part of right lower leg with unspecified severity: Secondary | ICD-10-CM | POA: Diagnosis not present

## 2018-02-25 NOTE — Telephone Encounter (Signed)
Message left on triage phone that they are needing to speak with someone regarding this pts portable wound vac

## 2018-02-25 NOTE — Telephone Encounter (Signed)
Called and sw [name]. Pt is s/p a total knee replacement 02/17/18 and had a vac placed. SNF called and states that the vac container is full and they have already used the replacement. Per Dr. Sharol Given ok to d/c the vac and apply a dry dressing and ace bandage and changes this every day. Will call with questions.

## 2018-02-26 DIAGNOSIS — I10 Essential (primary) hypertension: Secondary | ICD-10-CM | POA: Diagnosis not present

## 2018-02-26 DIAGNOSIS — E1140 Type 2 diabetes mellitus with diabetic neuropathy, unspecified: Secondary | ICD-10-CM | POA: Diagnosis not present

## 2018-02-26 DIAGNOSIS — Z9889 Other specified postprocedural states: Secondary | ICD-10-CM | POA: Diagnosis not present

## 2018-03-02 DIAGNOSIS — M6281 Muscle weakness (generalized): Secondary | ICD-10-CM | POA: Diagnosis not present

## 2018-03-02 DIAGNOSIS — M25561 Pain in right knee: Secondary | ICD-10-CM | POA: Diagnosis not present

## 2018-03-02 DIAGNOSIS — R262 Difficulty in walking, not elsewhere classified: Secondary | ICD-10-CM | POA: Diagnosis not present

## 2018-03-02 DIAGNOSIS — R5381 Other malaise: Secondary | ICD-10-CM | POA: Diagnosis not present

## 2018-03-03 ENCOUNTER — Ambulatory Visit (INDEPENDENT_AMBULATORY_CARE_PROVIDER_SITE_OTHER): Payer: Medicare Other

## 2018-03-03 ENCOUNTER — Ambulatory Visit (INDEPENDENT_AMBULATORY_CARE_PROVIDER_SITE_OTHER): Payer: Medicare Other | Admitting: Family

## 2018-03-03 ENCOUNTER — Encounter (INDEPENDENT_AMBULATORY_CARE_PROVIDER_SITE_OTHER): Payer: Self-pay | Admitting: Family

## 2018-03-03 VITALS — Ht 63.0 in | Wt 334.0 lb

## 2018-03-03 DIAGNOSIS — Z96651 Presence of right artificial knee joint: Secondary | ICD-10-CM | POA: Diagnosis not present

## 2018-03-04 DIAGNOSIS — L97819 Non-pressure chronic ulcer of other part of right lower leg with unspecified severity: Secondary | ICD-10-CM | POA: Diagnosis not present

## 2018-03-09 DIAGNOSIS — R262 Difficulty in walking, not elsewhere classified: Secondary | ICD-10-CM | POA: Diagnosis not present

## 2018-03-09 DIAGNOSIS — R5381 Other malaise: Secondary | ICD-10-CM | POA: Diagnosis not present

## 2018-03-09 DIAGNOSIS — M6281 Muscle weakness (generalized): Secondary | ICD-10-CM | POA: Diagnosis not present

## 2018-03-09 DIAGNOSIS — L02415 Cutaneous abscess of right lower limb: Secondary | ICD-10-CM | POA: Diagnosis not present

## 2018-03-09 DIAGNOSIS — M25561 Pain in right knee: Secondary | ICD-10-CM | POA: Diagnosis not present

## 2018-03-09 DIAGNOSIS — M79604 Pain in right leg: Secondary | ICD-10-CM | POA: Diagnosis not present

## 2018-03-09 NOTE — Progress Notes (Signed)
Post-Op Visit Note   Patient: [name]           Date of Birth: Jun 27, 1966           MRN: [id number] Visit Date: 03/03/2018 [facility]CP: [facility]  Chief Complaint:  Chief Complaint  Patient presents with  . Right Knee - Routine Post Op    HPI:  HPI  The patient is a 52 year old woman who presents today 2 weeks status post right knee revision of total knee arthroplasty.  She has been working with physical therapy.  Continues to use a knee immobilizer.  Today is in a wheelchair for ambulation.  Ortho Exam Incision is well-healed.  No gaping no erythema no drainage.  No sign of infection.  Has near full extension.  Laxity with varus and valgus stress however does have firm endpoints.  Visit Diagnoses:  1. Status post revision of total replacement of right knee     Plan: We have provided orders to continue with physical therapy.  May progress out of the extension immobilizer at therapist discretion.  Follow-up in the office in 2 weeks.  Staples harvested today.  Follow-Up Instructions: Return in about 1 month (around 03/31/2018).   Imaging: No results found.  Orders:  Orders Placed This Encounter  Procedures  . XR Knee 1-2 Views Right   No orders of the defined types were placed in this encounter.    PMFS History: Patient Active Problem List   Diagnosis Date Noted  . S/P revision of total knee 02/17/2018  . Failed total right knee replacement ([place])   . Chronic renal insufficiency, stage 2 (mild) 11/10/2017  . Acute CHF (congestive heart failure) ([place]) 11/10/2017  . Failed total knee arthroplasty, sequela 08/31/2017  . Lower extremity cellulitis 01/03/2013  . Diabetes mellitus ([place]) 01/03/2013  . Morbid (severe) obesity due to excess calories ([place]) 11/12/2006  . TOBACCO DEPENDENCE 11/12/2006  . DEPRESSIVE DISORDER, NOS 11/12/2006  . HYPERTENSION, BENIGN SYSTEMIC 11/12/2006  . GASTROESOPHAGEAL REFLUX, NO ESOPHAGITIS 11/12/2006  . ACNE  11/12/2006  . OSTEOARTHRITIS, LOWER LEG 11/12/2006  . APNEA, SLEEP 11/12/2006   Past Medical History:  Diagnosis Date  . Arthritis    "hands, arms, feet, back, knees" (02/17/2018)  . Cardiomyopathy [facility])    From Dr. Bonney Roussel office notes from 01/13/18  . CHF (congestive heart failure) ([place])   . Chronic lower back pain   . Diabetic neuropathy ([place])   . Family history of adverse reaction to anesthesia    Mother is hard to arouse and blood pressure drops  . GERD (gastroesophageal reflux disease)   . High cholesterol   . Hypertension   . Sleep apnea    "need new machine"  - does not use a cpap (02/17/2018)  . Type II diabetes mellitus ([place])    no longer on medications (02/17/2018)    Family History  Problem Relation Age of Onset  . Kidney disease Mother   . Congestive Heart Failure Mother   . Hypertension Father     Past Surgical History:  Procedure Laterality Date  . ANTERIOR CERVICAL DECOMP/DISCECTOMY FUSION  03/06/2010   C4-7 w/corpectomy/notes 03/17/2010  . BACK SURGERY    . CARPAL TUNNEL RELEASE Right   . HARDWARE REVISION  04/10/2010   cervical hardware revision screw replacement C4/notes 04/12/2010  . JOINT REPLACEMENT    . LAPAROSCOPIC CHOLECYSTECTOMY  1999  . LUMBAR LAMINECTOMY/DECOMPRESSION MICRODISCECTOMY  09/2010   Archie Endo 10/05/2010  . MULTIPLE EXTRACTIONS WITH ALVEOLOPLASTY [name] 01/19/2015  Procedure: MULTIPLE EXTRACTIONS Three, Four, Five, Seven, eight, nine, ten, eleven, twelve, fourteen, eighteen, twenty-one, twenty-eight, twenty-nine, thirty-one with Alveoloplasty.  ;  Surgeon: Diona Browner, DDS;  Location: [facility];  Service: Oral Surgery;  Laterality: [name];  . REPLACEMENT TOTAL KNEE BILATERAL Bilateral 10/2007-03/2008   "left-right"  . TOENAIL EXCISION Bilateral    great toes  . TONSILLECTOMY    . TOTAL KNEE REVISION Right 02/17/2018  . TOTAL KNEE REVISION Right 02/17/2018   Procedure: RIGHT TOTAL KNEE REVISION;  Surgeon: Newt Minion, MD;  Location: [facility];  Service:  Orthopedics;  Laterality: Right;   Social History   Occupational History  . Not on file  Tobacco Use  . Smoking status: Current Every Day Smoker    Packs/day: 0.50    Years: 32.00    Pack years: 16.00    Types: Cigarettes  . Smokeless tobacco: Never Used  Substance and Sexual Activity  . Alcohol use: Not Currently  . Drug use: Not Currently    Types: Marijuana  . Sexual activity: Not Currently

## 2018-03-10 DIAGNOSIS — T84012S Broken internal right knee prosthesis, sequela: Secondary | ICD-10-CM | POA: Diagnosis not present

## 2018-03-10 DIAGNOSIS — M25561 Pain in right knee: Secondary | ICD-10-CM | POA: Diagnosis not present

## 2018-03-11 ENCOUNTER — Ambulatory Visit (INDEPENDENT_AMBULATORY_CARE_PROVIDER_SITE_OTHER): Payer: Medicare Other | Admitting: Orthopedic Surgery

## 2018-03-11 ENCOUNTER — Ambulatory Visit (INDEPENDENT_AMBULATORY_CARE_PROVIDER_SITE_OTHER): Payer: Self-pay

## 2018-03-11 ENCOUNTER — Encounter (INDEPENDENT_AMBULATORY_CARE_PROVIDER_SITE_OTHER): Payer: Self-pay | Admitting: Orthopedic Surgery

## 2018-03-11 VITALS — Ht 63.0 in | Wt 334.0 lb

## 2018-03-11 DIAGNOSIS — M79661 Pain in right lower leg: Secondary | ICD-10-CM

## 2018-03-11 DIAGNOSIS — Z96651 Presence of right artificial knee joint: Secondary | ICD-10-CM

## 2018-03-11 DIAGNOSIS — L97819 Non-pressure chronic ulcer of other part of right lower leg with unspecified severity: Secondary | ICD-10-CM | POA: Diagnosis not present

## 2018-03-11 NOTE — Progress Notes (Signed)
Office Visit Note   Patient: [name]           Date of Birth: Nov 03, 1965           MRN: [id number] Visit Date: 03/11/2018              Requested by: Care, [facility] [address] [place], [place] [address] PCP: [facility], [facility]  Chief Complaint  Patient presents with  . Right Knee - Routine Post Op    02/17/18 right total knee revision       HPI: Patient is a 51 year old woman who is 3 weeks status post revision total knee arthroplasty.  Patient had acute onset of pain in her right knee and was concerned for a fracture.  Assessment & Plan: Visit Diagnoses:  1. Pain in right shin   2. Status post revision of total replacement of right knee     Plan: We will have patient continue with the knee immobilizer until she can do a straight leg raise.  She may have sprained or injured the patella tendon extensor mechanism.  Follow-Up Instructions: No follow-ups on file.   Ortho Exam  Patient is alert, oriented, no adenopathy, well-dressed, normal affect, normal respiratory effort. Examination the skin is intact there is no redness no cellulitis.  Patient has weakness with knee extension.  Radiographs shows no acute problems.  Imaging: Xr Tibia/fibula Right  Result Date: 03/11/2018 2 view radiographs of the right knee shows no evidence of a fracture there is calcification of the quad and patellar tendon.  There is no patella alter.  The total knee is congruent in AP and lateral planes.  No images are attached to the encounter.  Labs: Lab Results  Component Value Date   HGBA1C 11.3 (H) 01/03/2013   HGBA1C 8.6 (H) 03/05/2011   HGBA1C (H) 03/03/2010    9.8 (NOTE)                                                                       According to the ADA Clinical Practice Recommendations for 2011, when HbA1c is used as a screening test:   >=6.5%   Diagnostic of Diabetes Mellitus           (if abnormal result  is  confirmed)  5.7-6.4%   Increased risk of developing Diabetes Mellitus  References:Diagnosis and Classification of Diabetes Mellitus,Diabetes DGUY,4034,74(QVZDG 1):S62-S69 and Standards of Medical Care in         Diabetes - 2011,Diabetes Care,2011,34  (Suppl 1):S11-S61.   REPTSTATUS 04/16/2011 FINAL 04/10/2011   CULT NO GROWTH 5 DAYS 04/10/2011     Lab Results  Component Value Date   ALBUMIN 2.9 (L) 02/10/2018   ALBUMIN 2.5 (L) 11/10/2017   ALBUMIN 3.0 (L) 05/14/2014    Body mass index is 59.17 kg/m.  Orders:  Orders Placed This Encounter  Procedures  . XR Tibia/Fibula Right   No orders of the defined types were placed in this encounter.    Procedures: No procedures performed  Clinical Data: No additional findings.  ROS:  All other systems negative, except as noted in the HPI. Review of Systems  Objective: Vital Signs: Ht 5\' 3"  (1.6 m)   Wt Marland Kitchen)  334 lb (151.5 kg)   LMP 11/09/2012   BMI 59.17 kg/m   Specialty Comments:  No specialty comments available.  PMFS History: Patient Active Problem List   Diagnosis Date Noted  . S/P revision of total knee 02/17/2018  . Failed total right knee replacement ([place])   . Chronic renal insufficiency, stage 2 (mild) 11/10/2017  . Acute CHF (congestive heart failure) ([place]) 11/10/2017  . Failed total knee arthroplasty, sequela 08/31/2017  . Lower extremity cellulitis 01/03/2013  . Diabetes mellitus ([place]) 01/03/2013  . Morbid (severe) obesity due to excess calories ([place]) 11/12/2006  . TOBACCO DEPENDENCE 11/12/2006  . DEPRESSIVE DISORDER, NOS 11/12/2006  . HYPERTENSION, BENIGN SYSTEMIC 11/12/2006  . GASTROESOPHAGEAL REFLUX, NO ESOPHAGITIS 11/12/2006  . ACNE 11/12/2006  . OSTEOARTHRITIS, LOWER LEG 11/12/2006  . APNEA, SLEEP 11/12/2006   Past Medical History:  Diagnosis Date  . Arthritis    "hands, arms, feet, back, knees" (02/17/2018)  . Cardiomyopathy [facility])    From Dr. Bonney Roussel office notes from 01/13/18  . CHF  (congestive heart failure) ([place])   . Chronic lower back pain   . Diabetic neuropathy ([place])   . Family history of adverse reaction to anesthesia    Mother is hard to arouse and blood pressure drops  . GERD (gastroesophageal reflux disease)   . High cholesterol   . Hypertension   . Sleep apnea    "need new machine"  - does not use a cpap (02/17/2018)  . Type II diabetes mellitus ([place])    no longer on medications (02/17/2018)    Family History  Problem Relation Age of Onset  . Kidney disease Mother   . Congestive Heart Failure Mother   . Hypertension Father     Past Surgical History:  Procedure Laterality Date  . ANTERIOR CERVICAL DECOMP/DISCECTOMY FUSION  03/06/2010   C4-7 w/corpectomy/notes 03/17/2010  . BACK SURGERY    . CARPAL TUNNEL RELEASE Right   . HARDWARE REVISION  04/10/2010   cervical hardware revision screw replacement C4/notes 04/12/2010  . JOINT REPLACEMENT    . LAPAROSCOPIC CHOLECYSTECTOMY  1999  . LUMBAR LAMINECTOMY/DECOMPRESSION MICRODISCECTOMY  09/2010   Archie Endo 10/05/2010  . MULTIPLE EXTRACTIONS WITH ALVEOLOPLASTY [name] 01/19/2015   Procedure: MULTIPLE EXTRACTIONS Three, Four, Five, Seven, eight, nine, ten, eleven, twelve, fourteen, eighteen, twenty-one, twenty-eight, twenty-nine, thirty-one with Alveoloplasty.  ;  Surgeon: Diona Browner, DDS;  Location: [facility];  Service: Oral Surgery;  Laterality: [name];  . REPLACEMENT TOTAL KNEE BILATERAL Bilateral 10/2007-03/2008   "left-right"  . TOENAIL EXCISION Bilateral    great toes  . TONSILLECTOMY    . TOTAL KNEE REVISION Right 02/17/2018  . TOTAL KNEE REVISION Right 02/17/2018   Procedure: RIGHT TOTAL KNEE REVISION;  Surgeon: Newt Minion, MD;  Location: [facility];  Service: Orthopedics;  Laterality: Right;   Social History   Occupational History  . Not on file  Tobacco Use  . Smoking status: Current Every Day Smoker    Packs/day: 0.50    Years: 32.00    Pack years: 16.00    Types: Cigarettes  . Smokeless tobacco: Never Used    Substance and Sexual Activity  . Alcohol use: Not Currently  . Drug use: Not Currently    Types: Marijuana  . Sexual activity: Not Currently

## 2018-03-12 DIAGNOSIS — T84012S Broken internal right knee prosthesis, sequela: Secondary | ICD-10-CM | POA: Diagnosis not present

## 2018-03-12 DIAGNOSIS — M6281 Muscle weakness (generalized): Secondary | ICD-10-CM | POA: Diagnosis not present

## 2018-03-12 DIAGNOSIS — M25561 Pain in right knee: Secondary | ICD-10-CM | POA: Diagnosis not present

## 2018-03-17 DIAGNOSIS — L299 Pruritus, unspecified: Secondary | ICD-10-CM | POA: Diagnosis not present

## 2018-03-17 DIAGNOSIS — N760 Acute vaginitis: Secondary | ICD-10-CM | POA: Diagnosis not present

## 2018-03-25 ENCOUNTER — Encounter: Payer: Medicare Other | Admitting: Podiatry

## 2018-03-25 DIAGNOSIS — M6281 Muscle weakness (generalized): Secondary | ICD-10-CM | POA: Diagnosis not present

## 2018-03-25 DIAGNOSIS — G8929 Other chronic pain: Secondary | ICD-10-CM | POA: Diagnosis not present

## 2018-03-25 DIAGNOSIS — T84012S Broken internal right knee prosthesis, sequela: Secondary | ICD-10-CM | POA: Diagnosis not present

## 2018-03-25 DIAGNOSIS — M25561 Pain in right knee: Secondary | ICD-10-CM | POA: Diagnosis not present

## 2018-04-01 ENCOUNTER — Telehealth (INDEPENDENT_AMBULATORY_CARE_PROVIDER_SITE_OTHER): Payer: Self-pay | Admitting: Orthopedic Surgery

## 2018-04-01 NOTE — Telephone Encounter (Signed)
Patient called asked if she can get a Rx for a new leg immobilizer. Patient said it is Velcro is coming loose and patient said she is afraid she is going to fall. The number to contact patient is [phone]

## 2018-04-01 NOTE — Telephone Encounter (Signed)
I called pt and she has an appt Monday with Dr. Sharol Given and will pick up a new one at her appt.

## 2018-04-03 DIAGNOSIS — T84012D Broken internal right knee prosthesis, subsequent encounter: Secondary | ICD-10-CM | POA: Diagnosis not present

## 2018-04-03 DIAGNOSIS — K219 Gastro-esophageal reflux disease without esophagitis: Secondary | ICD-10-CM | POA: Diagnosis not present

## 2018-04-03 DIAGNOSIS — E785 Hyperlipidemia, unspecified: Secondary | ICD-10-CM | POA: Diagnosis not present

## 2018-04-03 DIAGNOSIS — N182 Chronic kidney disease, stage 2 (mild): Secondary | ICD-10-CM | POA: Diagnosis not present

## 2018-04-03 DIAGNOSIS — E1140 Type 2 diabetes mellitus with diabetic neuropathy, unspecified: Secondary | ICD-10-CM | POA: Diagnosis not present

## 2018-04-03 DIAGNOSIS — G4733 Obstructive sleep apnea (adult) (pediatric): Secondary | ICD-10-CM | POA: Diagnosis not present

## 2018-04-03 DIAGNOSIS — Z7982 Long term (current) use of aspirin: Secondary | ICD-10-CM | POA: Diagnosis not present

## 2018-04-03 DIAGNOSIS — Z794 Long term (current) use of insulin: Secondary | ICD-10-CM | POA: Diagnosis not present

## 2018-04-03 DIAGNOSIS — E1122 Type 2 diabetes mellitus with diabetic chronic kidney disease: Secondary | ICD-10-CM | POA: Diagnosis not present

## 2018-04-03 DIAGNOSIS — I130 Hypertensive heart and chronic kidney disease with heart failure and stage 1 through stage 4 chronic kidney disease, or unspecified chronic kidney disease: Secondary | ICD-10-CM | POA: Diagnosis not present

## 2018-04-03 DIAGNOSIS — I509 Heart failure, unspecified: Secondary | ICD-10-CM | POA: Diagnosis not present

## 2018-04-05 ENCOUNTER — Telehealth (INDEPENDENT_AMBULATORY_CARE_PROVIDER_SITE_OTHER): Payer: Self-pay

## 2018-04-05 ENCOUNTER — Telehealth (INDEPENDENT_AMBULATORY_CARE_PROVIDER_SITE_OTHER): Payer: Self-pay | Admitting: Orthopedic Surgery

## 2018-04-05 ENCOUNTER — Ambulatory Visit (INDEPENDENT_AMBULATORY_CARE_PROVIDER_SITE_OTHER): Payer: Medicare Other | Admitting: Orthopedic Surgery

## 2018-04-05 NOTE — Telephone Encounter (Signed)
Patient called advised the velcro broke at her thigh and she want to pick up another brace before her appointment on Thursday. The number to contact patient is [phone]

## 2018-04-05 NOTE — Telephone Encounter (Signed)
I called and left voicemail advising. °

## 2018-04-05 NOTE — Telephone Encounter (Signed)
Ok for new immobilizer?  Patient has follow up appt on Thursday.

## 2018-04-05 NOTE — Telephone Encounter (Signed)
Ok for new orders 

## 2018-04-05 NOTE — Telephone Encounter (Signed)
Verbal orders are needed for HHPT to treat patient for 1 x week for 1 week and 2 x week for 5 weeks.Cb# is [phone].  Please speak with Dillard Essex.  Please advise.  Thank You.

## 2018-04-05 NOTE — Telephone Encounter (Signed)
Ok for new immob

## 2018-04-05 NOTE — Telephone Encounter (Signed)
Ok for orders? 

## 2018-04-05 NOTE — Telephone Encounter (Signed)
New immobilizer put up front for pick up. Patient's daughter will come by to get it. Patient knows how to set up and put on immobilizer.

## 2018-04-06 DIAGNOSIS — N182 Chronic kidney disease, stage 2 (mild): Secondary | ICD-10-CM | POA: Diagnosis not present

## 2018-04-06 DIAGNOSIS — T84012D Broken internal right knee prosthesis, subsequent encounter: Secondary | ICD-10-CM | POA: Diagnosis not present

## 2018-04-06 DIAGNOSIS — I130 Hypertensive heart and chronic kidney disease with heart failure and stage 1 through stage 4 chronic kidney disease, or unspecified chronic kidney disease: Secondary | ICD-10-CM | POA: Diagnosis not present

## 2018-04-06 DIAGNOSIS — E1140 Type 2 diabetes mellitus with diabetic neuropathy, unspecified: Secondary | ICD-10-CM | POA: Diagnosis not present

## 2018-04-06 DIAGNOSIS — Z794 Long term (current) use of insulin: Secondary | ICD-10-CM | POA: Diagnosis not present

## 2018-04-06 DIAGNOSIS — E785 Hyperlipidemia, unspecified: Secondary | ICD-10-CM | POA: Diagnosis not present

## 2018-04-06 DIAGNOSIS — I509 Heart failure, unspecified: Secondary | ICD-10-CM | POA: Diagnosis not present

## 2018-04-06 DIAGNOSIS — G4733 Obstructive sleep apnea (adult) (pediatric): Secondary | ICD-10-CM | POA: Diagnosis not present

## 2018-04-06 DIAGNOSIS — Z7982 Long term (current) use of aspirin: Secondary | ICD-10-CM | POA: Diagnosis not present

## 2018-04-06 DIAGNOSIS — E1122 Type 2 diabetes mellitus with diabetic chronic kidney disease: Secondary | ICD-10-CM | POA: Diagnosis not present

## 2018-04-06 DIAGNOSIS — K219 Gastro-esophageal reflux disease without esophagitis: Secondary | ICD-10-CM | POA: Diagnosis not present

## 2018-04-07 DIAGNOSIS — T84012D Broken internal right knee prosthesis, subsequent encounter: Secondary | ICD-10-CM | POA: Diagnosis not present

## 2018-04-07 DIAGNOSIS — I509 Heart failure, unspecified: Secondary | ICD-10-CM | POA: Diagnosis not present

## 2018-04-07 DIAGNOSIS — I130 Hypertensive heart and chronic kidney disease with heart failure and stage 1 through stage 4 chronic kidney disease, or unspecified chronic kidney disease: Secondary | ICD-10-CM | POA: Diagnosis not present

## 2018-04-07 DIAGNOSIS — E785 Hyperlipidemia, unspecified: Secondary | ICD-10-CM | POA: Diagnosis not present

## 2018-04-07 DIAGNOSIS — E1140 Type 2 diabetes mellitus with diabetic neuropathy, unspecified: Secondary | ICD-10-CM | POA: Diagnosis not present

## 2018-04-07 DIAGNOSIS — K219 Gastro-esophageal reflux disease without esophagitis: Secondary | ICD-10-CM | POA: Diagnosis not present

## 2018-04-07 DIAGNOSIS — N182 Chronic kidney disease, stage 2 (mild): Secondary | ICD-10-CM | POA: Diagnosis not present

## 2018-04-07 DIAGNOSIS — G4733 Obstructive sleep apnea (adult) (pediatric): Secondary | ICD-10-CM | POA: Diagnosis not present

## 2018-04-07 DIAGNOSIS — Z794 Long term (current) use of insulin: Secondary | ICD-10-CM | POA: Diagnosis not present

## 2018-04-07 DIAGNOSIS — Z7982 Long term (current) use of aspirin: Secondary | ICD-10-CM | POA: Diagnosis not present

## 2018-04-07 DIAGNOSIS — E1122 Type 2 diabetes mellitus with diabetic chronic kidney disease: Secondary | ICD-10-CM | POA: Diagnosis not present

## 2018-04-08 ENCOUNTER — Ambulatory Visit (INDEPENDENT_AMBULATORY_CARE_PROVIDER_SITE_OTHER): Payer: Medicare Other | Admitting: Orthopedic Surgery

## 2018-04-08 ENCOUNTER — Encounter (INDEPENDENT_AMBULATORY_CARE_PROVIDER_SITE_OTHER): Payer: Self-pay | Admitting: Orthopedic Surgery

## 2018-04-08 DIAGNOSIS — E1142 Type 2 diabetes mellitus with diabetic polyneuropathy: Secondary | ICD-10-CM

## 2018-04-08 DIAGNOSIS — Z6841 Body Mass Index (BMI) 40.0 and over, adult: Secondary | ICD-10-CM

## 2018-04-08 DIAGNOSIS — E43 Unspecified severe protein-calorie malnutrition: Secondary | ICD-10-CM

## 2018-04-08 DIAGNOSIS — Z96651 Presence of right artificial knee joint: Secondary | ICD-10-CM

## 2018-04-08 MED ORDER — OXYCODONE-ACETAMINOPHEN 5-325 MG PO TABS: 1.0000 | ORAL_TABLET | Freq: Three times a day (TID) | ORAL | 0 refills | Status: DC | PRN

## 2018-04-08 NOTE — Progress Notes (Signed)
Office Visit Note   Patient: [name]           Date of Birth: Jul 16, 1966           MRN: [id number] Visit Date: 04/08/2018              Requested by: [facility], [facility] [address] [place], [place] [address] PCP: [facility], [facility]  No chief complaint on file.     HPI: Patient is a 52 year old woman status post revision of her right total knee arthroplasty.  Patient states she is just been discharged from rehab she is at home with home health therapy.  Assessment & Plan: Visit Diagnoses:  1. Status post revision of total replacement of right knee     Plan: Discussed that she can discontinue the immobilizer when she is seated she is to continue working on extension exercises and wear the immobilizer for gait training.  Follow-Up Instructions: Return in about 1 month (around 05/06/2018).   Ortho Exam  Patient is alert, oriented, no adenopathy, well-dressed, normal affect, normal respiratory effort. Patient ambulates in a motorized wheelchair she still has weakness but she does have active extension of the right lower extremity the extensor mechanism is palpable and intact along the patella tendon and quad tendon.  There is no patella alto.  Passively she has good range of motion of the knee actively she lacks about 40 degrees to patient is short of breath with attempted straight leg raise.  Imaging: No results found. No images are attached to the encounter.  Labs: Lab Results  Component Value Date   HGBA1C 11.3 (H) 01/03/2013   HGBA1C 8.6 (H) 03/05/2011   HGBA1C (H) 03/03/2010    9.8 (NOTE)                                                                       According to the ADA Clinical Practice Recommendations for 2011, when HbA1c is used as a screening test:   >=6.5%   Diagnostic of Diabetes Mellitus           (if abnormal result  is confirmed)  5.7-6.4%   Increased risk of developing Diabetes Mellitus   References:Diagnosis and Classification of Diabetes Mellitus,Diabetes JSEG,3151,76(HYWVP 1):S62-S69 and Standards of Medical Care in         Diabetes - 2011,Diabetes Care,2011,34  (Suppl 1):S11-S61.   REPTSTATUS 04/16/2011 FINAL 04/10/2011   CULT NO GROWTH 5 DAYS 04/10/2011     Lab Results  Component Value Date   ALBUMIN 2.9 (L) 02/10/2018   ALBUMIN 2.5 (L) 11/10/2017   ALBUMIN 3.0 (L) 05/14/2014    There is no height or weight on file to calculate BMI.  Orders:  No orders of the defined types were placed in this encounter.  Meds ordered this encounter  Medications  . oxyCODONE-acetaminophen (PERCOCET/ROXICET) 5-325 MG tablet    Sig: Take 1 tablet by mouth every 8 (eight) hours as needed.    Dispense:  30 tablet    Refill:  0     Procedures: No procedures performed  Clinical Data: No additional findings.  ROS:  All other systems negative, except as noted in the HPI. Review of Systems  Objective:  Vital Signs: LMP 11/09/2012   Specialty Comments:  No specialty comments available.  PMFS History: Patient Active Problem List   Diagnosis Date Noted  . S/P revision of total knee 02/17/2018  . Failed total right knee replacement ([place])   . Chronic renal insufficiency, stage 2 (mild) 11/10/2017  . Acute CHF (congestive heart failure) ([place]) 11/10/2017  . Failed total knee arthroplasty, sequela 08/31/2017  . Lower extremity cellulitis 01/03/2013  . Diabetes mellitus ([place]) 01/03/2013  . Morbid (severe) obesity due to excess calories ([place]) 11/12/2006  . TOBACCO DEPENDENCE 11/12/2006  . DEPRESSIVE DISORDER, NOS 11/12/2006  . HYPERTENSION, BENIGN SYSTEMIC 11/12/2006  . GASTROESOPHAGEAL REFLUX, NO ESOPHAGITIS 11/12/2006  . ACNE 11/12/2006  . OSTEOARTHRITIS, LOWER LEG 11/12/2006  . APNEA, SLEEP 11/12/2006   Past Medical History:  Diagnosis Date  . Arthritis    "hands, arms, feet, back, knees" (02/17/2018)  . Cardiomyopathy [facility])    From Dr. Bonney Roussel office notes  from 01/13/18  . CHF (congestive heart failure) ([place])   . Chronic lower back pain   . Diabetic neuropathy ([place])   . Family history of adverse reaction to anesthesia    Mother is hard to arouse and blood pressure drops  . GERD (gastroesophageal reflux disease)   . High cholesterol   . Hypertension   . Sleep apnea    "need new machine"  - does not use a cpap (02/17/2018)  . Type II diabetes mellitus ([place])    no longer on medications (02/17/2018)    Family History  Problem Relation Age of Onset  . Kidney disease Mother   . Congestive Heart Failure Mother   . Hypertension Father     Past Surgical History:  Procedure Laterality Date  . ANTERIOR CERVICAL DECOMP/DISCECTOMY FUSION  03/06/2010   C4-7 w/corpectomy/notes 03/17/2010  . BACK SURGERY    . CARPAL TUNNEL RELEASE Right   . HARDWARE REVISION  04/10/2010   cervical hardware revision screw replacement C4/notes 04/12/2010  . JOINT REPLACEMENT    . LAPAROSCOPIC CHOLECYSTECTOMY  1999  . LUMBAR LAMINECTOMY/DECOMPRESSION MICRODISCECTOMY  09/2010   Archie Endo 10/05/2010  . MULTIPLE EXTRACTIONS WITH ALVEOLOPLASTY [name] 01/19/2015   Procedure: MULTIPLE EXTRACTIONS Three, Four, Five, Seven, eight, nine, ten, eleven, twelve, fourteen, eighteen, twenty-one, twenty-eight, twenty-nine, thirty-one with Alveoloplasty.  ;  Surgeon: Diona Browner, DDS;  Location: [facility];  Service: Oral Surgery;  Laterality: [name];  . REPLACEMENT TOTAL KNEE BILATERAL Bilateral 10/2007-03/2008   "left-right"  . TOENAIL EXCISION Bilateral    great toes  . TONSILLECTOMY    . TOTAL KNEE REVISION Right 02/17/2018  . TOTAL KNEE REVISION Right 02/17/2018   Procedure: RIGHT TOTAL KNEE REVISION;  Surgeon: Newt Minion, MD;  Location: [facility];  Service: Orthopedics;  Laterality: Right;   Social History   Occupational History  . Not on file  Tobacco Use  . Smoking status: Current Every Day Smoker    Packs/day: 0.50    Years: 32.00    Pack years: 16.00    Types: Cigarettes  . Smokeless  tobacco: Never Used  Substance and Sexual Activity  . Alcohol use: Not Currently  . Drug use: Not Currently    Types: Marijuana  . Sexual activity: Not Currently

## 2018-04-09 DIAGNOSIS — E1122 Type 2 diabetes mellitus with diabetic chronic kidney disease: Secondary | ICD-10-CM | POA: Diagnosis not present

## 2018-04-09 DIAGNOSIS — N182 Chronic kidney disease, stage 2 (mild): Secondary | ICD-10-CM | POA: Diagnosis not present

## 2018-04-09 DIAGNOSIS — T84012D Broken internal right knee prosthesis, subsequent encounter: Secondary | ICD-10-CM | POA: Diagnosis not present

## 2018-04-09 DIAGNOSIS — Z7982 Long term (current) use of aspirin: Secondary | ICD-10-CM | POA: Diagnosis not present

## 2018-04-09 DIAGNOSIS — E785 Hyperlipidemia, unspecified: Secondary | ICD-10-CM | POA: Diagnosis not present

## 2018-04-09 DIAGNOSIS — G4733 Obstructive sleep apnea (adult) (pediatric): Secondary | ICD-10-CM | POA: Diagnosis not present

## 2018-04-09 DIAGNOSIS — I130 Hypertensive heart and chronic kidney disease with heart failure and stage 1 through stage 4 chronic kidney disease, or unspecified chronic kidney disease: Secondary | ICD-10-CM | POA: Diagnosis not present

## 2018-04-09 DIAGNOSIS — K219 Gastro-esophageal reflux disease without esophagitis: Secondary | ICD-10-CM | POA: Diagnosis not present

## 2018-04-09 DIAGNOSIS — I509 Heart failure, unspecified: Secondary | ICD-10-CM | POA: Diagnosis not present

## 2018-04-09 DIAGNOSIS — Z794 Long term (current) use of insulin: Secondary | ICD-10-CM | POA: Diagnosis not present

## 2018-04-09 DIAGNOSIS — E1140 Type 2 diabetes mellitus with diabetic neuropathy, unspecified: Secondary | ICD-10-CM | POA: Diagnosis not present

## 2018-04-14 DIAGNOSIS — I509 Heart failure, unspecified: Secondary | ICD-10-CM | POA: Diagnosis not present

## 2018-04-14 DIAGNOSIS — G4733 Obstructive sleep apnea (adult) (pediatric): Secondary | ICD-10-CM | POA: Diagnosis not present

## 2018-04-14 DIAGNOSIS — E1140 Type 2 diabetes mellitus with diabetic neuropathy, unspecified: Secondary | ICD-10-CM | POA: Diagnosis not present

## 2018-04-14 DIAGNOSIS — Z794 Long term (current) use of insulin: Secondary | ICD-10-CM | POA: Diagnosis not present

## 2018-04-14 DIAGNOSIS — E785 Hyperlipidemia, unspecified: Secondary | ICD-10-CM | POA: Diagnosis not present

## 2018-04-14 DIAGNOSIS — E1122 Type 2 diabetes mellitus with diabetic chronic kidney disease: Secondary | ICD-10-CM | POA: Diagnosis not present

## 2018-04-14 DIAGNOSIS — K219 Gastro-esophageal reflux disease without esophagitis: Secondary | ICD-10-CM | POA: Diagnosis not present

## 2018-04-14 DIAGNOSIS — T84012D Broken internal right knee prosthesis, subsequent encounter: Secondary | ICD-10-CM | POA: Diagnosis not present

## 2018-04-14 DIAGNOSIS — I130 Hypertensive heart and chronic kidney disease with heart failure and stage 1 through stage 4 chronic kidney disease, or unspecified chronic kidney disease: Secondary | ICD-10-CM | POA: Diagnosis not present

## 2018-04-14 DIAGNOSIS — Z7982 Long term (current) use of aspirin: Secondary | ICD-10-CM | POA: Diagnosis not present

## 2018-04-14 DIAGNOSIS — N182 Chronic kidney disease, stage 2 (mild): Secondary | ICD-10-CM | POA: Diagnosis not present

## 2018-04-16 DIAGNOSIS — Z7982 Long term (current) use of aspirin: Secondary | ICD-10-CM | POA: Diagnosis not present

## 2018-04-16 DIAGNOSIS — K219 Gastro-esophageal reflux disease without esophagitis: Secondary | ICD-10-CM | POA: Diagnosis not present

## 2018-04-16 DIAGNOSIS — T84012D Broken internal right knee prosthesis, subsequent encounter: Secondary | ICD-10-CM | POA: Diagnosis not present

## 2018-04-16 DIAGNOSIS — E785 Hyperlipidemia, unspecified: Secondary | ICD-10-CM | POA: Diagnosis not present

## 2018-04-16 DIAGNOSIS — N182 Chronic kidney disease, stage 2 (mild): Secondary | ICD-10-CM | POA: Diagnosis not present

## 2018-04-16 DIAGNOSIS — I509 Heart failure, unspecified: Secondary | ICD-10-CM | POA: Diagnosis not present

## 2018-04-16 DIAGNOSIS — E1122 Type 2 diabetes mellitus with diabetic chronic kidney disease: Secondary | ICD-10-CM | POA: Diagnosis not present

## 2018-04-16 DIAGNOSIS — I130 Hypertensive heart and chronic kidney disease with heart failure and stage 1 through stage 4 chronic kidney disease, or unspecified chronic kidney disease: Secondary | ICD-10-CM | POA: Diagnosis not present

## 2018-04-16 DIAGNOSIS — G4733 Obstructive sleep apnea (adult) (pediatric): Secondary | ICD-10-CM | POA: Diagnosis not present

## 2018-04-16 DIAGNOSIS — E1140 Type 2 diabetes mellitus with diabetic neuropathy, unspecified: Secondary | ICD-10-CM | POA: Diagnosis not present

## 2018-04-16 DIAGNOSIS — Z794 Long term (current) use of insulin: Secondary | ICD-10-CM | POA: Diagnosis not present

## 2018-04-18 DIAGNOSIS — G4733 Obstructive sleep apnea (adult) (pediatric): Secondary | ICD-10-CM | POA: Diagnosis not present

## 2018-04-18 DIAGNOSIS — N182 Chronic kidney disease, stage 2 (mild): Secondary | ICD-10-CM | POA: Diagnosis not present

## 2018-04-18 DIAGNOSIS — I509 Heart failure, unspecified: Secondary | ICD-10-CM | POA: Diagnosis not present

## 2018-04-18 DIAGNOSIS — E1122 Type 2 diabetes mellitus with diabetic chronic kidney disease: Secondary | ICD-10-CM | POA: Diagnosis not present

## 2018-04-18 DIAGNOSIS — Z7982 Long term (current) use of aspirin: Secondary | ICD-10-CM | POA: Diagnosis not present

## 2018-04-18 DIAGNOSIS — E1140 Type 2 diabetes mellitus with diabetic neuropathy, unspecified: Secondary | ICD-10-CM | POA: Diagnosis not present

## 2018-04-18 DIAGNOSIS — E785 Hyperlipidemia, unspecified: Secondary | ICD-10-CM | POA: Diagnosis not present

## 2018-04-18 DIAGNOSIS — K219 Gastro-esophageal reflux disease without esophagitis: Secondary | ICD-10-CM | POA: Diagnosis not present

## 2018-04-18 DIAGNOSIS — Z794 Long term (current) use of insulin: Secondary | ICD-10-CM | POA: Diagnosis not present

## 2018-04-18 DIAGNOSIS — T84012D Broken internal right knee prosthesis, subsequent encounter: Secondary | ICD-10-CM | POA: Diagnosis not present

## 2018-04-18 DIAGNOSIS — I130 Hypertensive heart and chronic kidney disease with heart failure and stage 1 through stage 4 chronic kidney disease, or unspecified chronic kidney disease: Secondary | ICD-10-CM | POA: Diagnosis not present

## 2018-04-19 DIAGNOSIS — G894 Chronic pain syndrome: Secondary | ICD-10-CM | POA: Diagnosis not present

## 2018-04-19 DIAGNOSIS — G802 Spastic hemiplegic cerebral palsy: Secondary | ICD-10-CM | POA: Diagnosis not present

## 2018-04-20 ENCOUNTER — Telehealth (INDEPENDENT_AMBULATORY_CARE_PROVIDER_SITE_OTHER): Payer: Self-pay | Admitting: Orthopedic Surgery

## 2018-04-20 ENCOUNTER — Other Ambulatory Visit (INDEPENDENT_AMBULATORY_CARE_PROVIDER_SITE_OTHER): Payer: Self-pay

## 2018-04-20 DIAGNOSIS — K219 Gastro-esophageal reflux disease without esophagitis: Secondary | ICD-10-CM | POA: Diagnosis not present

## 2018-04-20 DIAGNOSIS — Z794 Long term (current) use of insulin: Secondary | ICD-10-CM | POA: Diagnosis not present

## 2018-04-20 DIAGNOSIS — G4733 Obstructive sleep apnea (adult) (pediatric): Secondary | ICD-10-CM | POA: Diagnosis not present

## 2018-04-20 DIAGNOSIS — E785 Hyperlipidemia, unspecified: Secondary | ICD-10-CM | POA: Diagnosis not present

## 2018-04-20 DIAGNOSIS — Z7982 Long term (current) use of aspirin: Secondary | ICD-10-CM | POA: Diagnosis not present

## 2018-04-20 DIAGNOSIS — N182 Chronic kidney disease, stage 2 (mild): Secondary | ICD-10-CM | POA: Diagnosis not present

## 2018-04-20 DIAGNOSIS — I130 Hypertensive heart and chronic kidney disease with heart failure and stage 1 through stage 4 chronic kidney disease, or unspecified chronic kidney disease: Secondary | ICD-10-CM | POA: Diagnosis not present

## 2018-04-20 DIAGNOSIS — E1122 Type 2 diabetes mellitus with diabetic chronic kidney disease: Secondary | ICD-10-CM | POA: Diagnosis not present

## 2018-04-20 DIAGNOSIS — T84012D Broken internal right knee prosthesis, subsequent encounter: Secondary | ICD-10-CM | POA: Diagnosis not present

## 2018-04-20 DIAGNOSIS — I509 Heart failure, unspecified: Secondary | ICD-10-CM | POA: Diagnosis not present

## 2018-04-20 DIAGNOSIS — E1140 Type 2 diabetes mellitus with diabetic neuropathy, unspecified: Secondary | ICD-10-CM | POA: Diagnosis not present

## 2018-04-20 MED ORDER — OXYCODONE-ACETAMINOPHEN 5-325 MG PO TABS: 1.0000 | ORAL_TABLET | Freq: Three times a day (TID) | ORAL | 0 refills | Status: DC | PRN

## 2018-04-20 NOTE — Telephone Encounter (Signed)
Patient requesting rx refill on oxycodone. Patients # [phone]

## 2018-04-21 NOTE — Telephone Encounter (Signed)
I called and lm on vm to advise that rx is at the front desk for pick up.

## 2018-04-23 DIAGNOSIS — I130 Hypertensive heart and chronic kidney disease with heart failure and stage 1 through stage 4 chronic kidney disease, or unspecified chronic kidney disease: Secondary | ICD-10-CM | POA: Diagnosis not present

## 2018-04-23 DIAGNOSIS — E1122 Type 2 diabetes mellitus with diabetic chronic kidney disease: Secondary | ICD-10-CM | POA: Diagnosis not present

## 2018-04-23 DIAGNOSIS — G4733 Obstructive sleep apnea (adult) (pediatric): Secondary | ICD-10-CM | POA: Diagnosis not present

## 2018-04-23 DIAGNOSIS — E1140 Type 2 diabetes mellitus with diabetic neuropathy, unspecified: Secondary | ICD-10-CM | POA: Diagnosis not present

## 2018-04-23 DIAGNOSIS — K219 Gastro-esophageal reflux disease without esophagitis: Secondary | ICD-10-CM | POA: Diagnosis not present

## 2018-04-23 DIAGNOSIS — T84012D Broken internal right knee prosthesis, subsequent encounter: Secondary | ICD-10-CM | POA: Diagnosis not present

## 2018-04-23 DIAGNOSIS — I509 Heart failure, unspecified: Secondary | ICD-10-CM | POA: Diagnosis not present

## 2018-04-23 DIAGNOSIS — N182 Chronic kidney disease, stage 2 (mild): Secondary | ICD-10-CM | POA: Diagnosis not present

## 2018-04-23 DIAGNOSIS — E785 Hyperlipidemia, unspecified: Secondary | ICD-10-CM | POA: Diagnosis not present

## 2018-04-23 DIAGNOSIS — Z794 Long term (current) use of insulin: Secondary | ICD-10-CM | POA: Diagnosis not present

## 2018-04-23 DIAGNOSIS — Z7982 Long term (current) use of aspirin: Secondary | ICD-10-CM | POA: Diagnosis not present

## 2018-04-26 DIAGNOSIS — E1140 Type 2 diabetes mellitus with diabetic neuropathy, unspecified: Secondary | ICD-10-CM | POA: Diagnosis not present

## 2018-04-26 DIAGNOSIS — K219 Gastro-esophageal reflux disease without esophagitis: Secondary | ICD-10-CM | POA: Diagnosis not present

## 2018-04-26 DIAGNOSIS — T84012D Broken internal right knee prosthesis, subsequent encounter: Secondary | ICD-10-CM | POA: Diagnosis not present

## 2018-04-26 DIAGNOSIS — E1122 Type 2 diabetes mellitus with diabetic chronic kidney disease: Secondary | ICD-10-CM | POA: Diagnosis not present

## 2018-04-26 DIAGNOSIS — I509 Heart failure, unspecified: Secondary | ICD-10-CM | POA: Diagnosis not present

## 2018-04-26 DIAGNOSIS — Z7982 Long term (current) use of aspirin: Secondary | ICD-10-CM | POA: Diagnosis not present

## 2018-04-26 DIAGNOSIS — N182 Chronic kidney disease, stage 2 (mild): Secondary | ICD-10-CM | POA: Diagnosis not present

## 2018-04-26 DIAGNOSIS — E785 Hyperlipidemia, unspecified: Secondary | ICD-10-CM | POA: Diagnosis not present

## 2018-04-26 DIAGNOSIS — Z794 Long term (current) use of insulin: Secondary | ICD-10-CM | POA: Diagnosis not present

## 2018-04-26 DIAGNOSIS — I130 Hypertensive heart and chronic kidney disease with heart failure and stage 1 through stage 4 chronic kidney disease, or unspecified chronic kidney disease: Secondary | ICD-10-CM | POA: Diagnosis not present

## 2018-04-26 DIAGNOSIS — G4733 Obstructive sleep apnea (adult) (pediatric): Secondary | ICD-10-CM | POA: Diagnosis not present

## 2018-04-29 ENCOUNTER — Other Ambulatory Visit (INDEPENDENT_AMBULATORY_CARE_PROVIDER_SITE_OTHER): Payer: Self-pay | Admitting: Orthopedic Surgery

## 2018-04-29 ENCOUNTER — Telehealth (INDEPENDENT_AMBULATORY_CARE_PROVIDER_SITE_OTHER): Payer: Self-pay

## 2018-04-29 DIAGNOSIS — Z794 Long term (current) use of insulin: Secondary | ICD-10-CM | POA: Diagnosis not present

## 2018-04-29 DIAGNOSIS — T84012D Broken internal right knee prosthesis, subsequent encounter: Secondary | ICD-10-CM | POA: Diagnosis not present

## 2018-04-29 DIAGNOSIS — N182 Chronic kidney disease, stage 2 (mild): Secondary | ICD-10-CM | POA: Diagnosis not present

## 2018-04-29 DIAGNOSIS — G4733 Obstructive sleep apnea (adult) (pediatric): Secondary | ICD-10-CM | POA: Diagnosis not present

## 2018-04-29 DIAGNOSIS — Z7982 Long term (current) use of aspirin: Secondary | ICD-10-CM | POA: Diagnosis not present

## 2018-04-29 DIAGNOSIS — I130 Hypertensive heart and chronic kidney disease with heart failure and stage 1 through stage 4 chronic kidney disease, or unspecified chronic kidney disease: Secondary | ICD-10-CM | POA: Diagnosis not present

## 2018-04-29 DIAGNOSIS — E785 Hyperlipidemia, unspecified: Secondary | ICD-10-CM | POA: Diagnosis not present

## 2018-04-29 DIAGNOSIS — K219 Gastro-esophageal reflux disease without esophagitis: Secondary | ICD-10-CM | POA: Diagnosis not present

## 2018-04-29 DIAGNOSIS — E1140 Type 2 diabetes mellitus with diabetic neuropathy, unspecified: Secondary | ICD-10-CM | POA: Diagnosis not present

## 2018-04-29 DIAGNOSIS — I509 Heart failure, unspecified: Secondary | ICD-10-CM | POA: Diagnosis not present

## 2018-04-29 DIAGNOSIS — E1122 Type 2 diabetes mellitus with diabetic chronic kidney disease: Secondary | ICD-10-CM | POA: Diagnosis not present

## 2018-04-29 MED ORDER — OXYCODONE-ACETAMINOPHEN 5-325 MG PO TABS
1.0000 | ORAL_TABLET | ORAL | 0 refills | Status: DC | PRN
Start: 2018-04-29 — End: 2018-05-06

## 2018-04-29 NOTE — Telephone Encounter (Signed)
Patient would like her CNA to come pick up rx. [name] - will have ID

## 2018-04-29 NOTE — Telephone Encounter (Signed)
Rx request 

## 2018-04-29 NOTE — Telephone Encounter (Signed)
rx written

## 2018-04-29 NOTE — Telephone Encounter (Signed)
IC patient and advised Rx ready for pickup at front desk.  LMVM

## 2018-04-29 NOTE — Telephone Encounter (Signed)
Patient called requesting prescription refill of Oxycodone.  Patient's # [phone]

## 2018-05-02 DIAGNOSIS — G802 Spastic hemiplegic cerebral palsy: Secondary | ICD-10-CM | POA: Diagnosis not present

## 2018-05-03 DIAGNOSIS — G4733 Obstructive sleep apnea (adult) (pediatric): Secondary | ICD-10-CM | POA: Diagnosis not present

## 2018-05-03 DIAGNOSIS — Z794 Long term (current) use of insulin: Secondary | ICD-10-CM | POA: Diagnosis not present

## 2018-05-03 DIAGNOSIS — T84012D Broken internal right knee prosthesis, subsequent encounter: Secondary | ICD-10-CM | POA: Diagnosis not present

## 2018-05-03 DIAGNOSIS — K219 Gastro-esophageal reflux disease without esophagitis: Secondary | ICD-10-CM | POA: Diagnosis not present

## 2018-05-03 DIAGNOSIS — E1140 Type 2 diabetes mellitus with diabetic neuropathy, unspecified: Secondary | ICD-10-CM | POA: Diagnosis not present

## 2018-05-03 DIAGNOSIS — Z7982 Long term (current) use of aspirin: Secondary | ICD-10-CM | POA: Diagnosis not present

## 2018-05-03 DIAGNOSIS — E1122 Type 2 diabetes mellitus with diabetic chronic kidney disease: Secondary | ICD-10-CM | POA: Diagnosis not present

## 2018-05-03 DIAGNOSIS — E785 Hyperlipidemia, unspecified: Secondary | ICD-10-CM | POA: Diagnosis not present

## 2018-05-03 DIAGNOSIS — I130 Hypertensive heart and chronic kidney disease with heart failure and stage 1 through stage 4 chronic kidney disease, or unspecified chronic kidney disease: Secondary | ICD-10-CM | POA: Diagnosis not present

## 2018-05-03 DIAGNOSIS — I509 Heart failure, unspecified: Secondary | ICD-10-CM | POA: Diagnosis not present

## 2018-05-03 DIAGNOSIS — N182 Chronic kidney disease, stage 2 (mild): Secondary | ICD-10-CM | POA: Diagnosis not present

## 2018-05-05 DIAGNOSIS — G4733 Obstructive sleep apnea (adult) (pediatric): Secondary | ICD-10-CM | POA: Diagnosis not present

## 2018-05-05 DIAGNOSIS — E1122 Type 2 diabetes mellitus with diabetic chronic kidney disease: Secondary | ICD-10-CM | POA: Diagnosis not present

## 2018-05-05 DIAGNOSIS — E785 Hyperlipidemia, unspecified: Secondary | ICD-10-CM | POA: Diagnosis not present

## 2018-05-05 DIAGNOSIS — E1140 Type 2 diabetes mellitus with diabetic neuropathy, unspecified: Secondary | ICD-10-CM | POA: Diagnosis not present

## 2018-05-05 DIAGNOSIS — T84012D Broken internal right knee prosthesis, subsequent encounter: Secondary | ICD-10-CM | POA: Diagnosis not present

## 2018-05-05 DIAGNOSIS — N182 Chronic kidney disease, stage 2 (mild): Secondary | ICD-10-CM | POA: Diagnosis not present

## 2018-05-05 DIAGNOSIS — Z794 Long term (current) use of insulin: Secondary | ICD-10-CM | POA: Diagnosis not present

## 2018-05-05 DIAGNOSIS — K219 Gastro-esophageal reflux disease without esophagitis: Secondary | ICD-10-CM | POA: Diagnosis not present

## 2018-05-05 DIAGNOSIS — I509 Heart failure, unspecified: Secondary | ICD-10-CM | POA: Diagnosis not present

## 2018-05-05 DIAGNOSIS — I130 Hypertensive heart and chronic kidney disease with heart failure and stage 1 through stage 4 chronic kidney disease, or unspecified chronic kidney disease: Secondary | ICD-10-CM | POA: Diagnosis not present

## 2018-05-05 DIAGNOSIS — Z7982 Long term (current) use of aspirin: Secondary | ICD-10-CM | POA: Diagnosis not present

## 2018-05-06 ENCOUNTER — Telehealth (INDEPENDENT_AMBULATORY_CARE_PROVIDER_SITE_OTHER): Payer: Self-pay | Admitting: Orthopedic Surgery

## 2018-05-06 ENCOUNTER — Other Ambulatory Visit (INDEPENDENT_AMBULATORY_CARE_PROVIDER_SITE_OTHER): Payer: Self-pay | Admitting: Orthopedic Surgery

## 2018-05-06 DIAGNOSIS — E785 Hyperlipidemia, unspecified: Secondary | ICD-10-CM | POA: Diagnosis not present

## 2018-05-06 DIAGNOSIS — T84012D Broken internal right knee prosthesis, subsequent encounter: Secondary | ICD-10-CM | POA: Diagnosis not present

## 2018-05-06 DIAGNOSIS — Z794 Long term (current) use of insulin: Secondary | ICD-10-CM | POA: Diagnosis not present

## 2018-05-06 DIAGNOSIS — N182 Chronic kidney disease, stage 2 (mild): Secondary | ICD-10-CM | POA: Diagnosis not present

## 2018-05-06 DIAGNOSIS — E1140 Type 2 diabetes mellitus with diabetic neuropathy, unspecified: Secondary | ICD-10-CM | POA: Diagnosis not present

## 2018-05-06 DIAGNOSIS — E1122 Type 2 diabetes mellitus with diabetic chronic kidney disease: Secondary | ICD-10-CM | POA: Diagnosis not present

## 2018-05-06 DIAGNOSIS — Z7982 Long term (current) use of aspirin: Secondary | ICD-10-CM | POA: Diagnosis not present

## 2018-05-06 DIAGNOSIS — G4733 Obstructive sleep apnea (adult) (pediatric): Secondary | ICD-10-CM | POA: Diagnosis not present

## 2018-05-06 DIAGNOSIS — I130 Hypertensive heart and chronic kidney disease with heart failure and stage 1 through stage 4 chronic kidney disease, or unspecified chronic kidney disease: Secondary | ICD-10-CM | POA: Diagnosis not present

## 2018-05-06 DIAGNOSIS — K219 Gastro-esophageal reflux disease without esophagitis: Secondary | ICD-10-CM | POA: Diagnosis not present

## 2018-05-06 DIAGNOSIS — I509 Heart failure, unspecified: Secondary | ICD-10-CM | POA: Diagnosis not present

## 2018-05-06 MED ORDER — OXYCODONE-ACETAMINOPHEN 5-325 MG PO TABS
1.0000 | ORAL_TABLET | ORAL | 0 refills | Status: DC | PRN
Start: 2018-05-06 — End: 2018-05-12

## 2018-05-06 NOTE — Telephone Encounter (Signed)
Pt is s/p a total knee revision 02/17/18 and requesting refill on percocet. Last refill was 04/29/18 #20

## 2018-05-06 NOTE — Telephone Encounter (Signed)
Patient called needing Rx refilled (Oxycodone) The number to contact patient is  [phone]

## 2018-05-06 NOTE — Telephone Encounter (Signed)
rx written

## 2018-05-06 NOTE — Telephone Encounter (Signed)
Called pt and advised that rx is ready for pick up at the front desk.

## 2018-05-07 DIAGNOSIS — Z794 Long term (current) use of insulin: Secondary | ICD-10-CM | POA: Diagnosis not present

## 2018-05-07 DIAGNOSIS — E785 Hyperlipidemia, unspecified: Secondary | ICD-10-CM | POA: Diagnosis not present

## 2018-05-07 DIAGNOSIS — N182 Chronic kidney disease, stage 2 (mild): Secondary | ICD-10-CM | POA: Diagnosis not present

## 2018-05-07 DIAGNOSIS — E1122 Type 2 diabetes mellitus with diabetic chronic kidney disease: Secondary | ICD-10-CM | POA: Diagnosis not present

## 2018-05-07 DIAGNOSIS — K219 Gastro-esophageal reflux disease without esophagitis: Secondary | ICD-10-CM | POA: Diagnosis not present

## 2018-05-07 DIAGNOSIS — G4733 Obstructive sleep apnea (adult) (pediatric): Secondary | ICD-10-CM | POA: Diagnosis not present

## 2018-05-07 DIAGNOSIS — I509 Heart failure, unspecified: Secondary | ICD-10-CM | POA: Diagnosis not present

## 2018-05-07 DIAGNOSIS — T84012D Broken internal right knee prosthesis, subsequent encounter: Secondary | ICD-10-CM | POA: Diagnosis not present

## 2018-05-07 DIAGNOSIS — I130 Hypertensive heart and chronic kidney disease with heart failure and stage 1 through stage 4 chronic kidney disease, or unspecified chronic kidney disease: Secondary | ICD-10-CM | POA: Diagnosis not present

## 2018-05-07 DIAGNOSIS — E1140 Type 2 diabetes mellitus with diabetic neuropathy, unspecified: Secondary | ICD-10-CM | POA: Diagnosis not present

## 2018-05-07 DIAGNOSIS — Z7982 Long term (current) use of aspirin: Secondary | ICD-10-CM | POA: Diagnosis not present

## 2018-05-10 ENCOUNTER — Ambulatory Visit (INDEPENDENT_AMBULATORY_CARE_PROVIDER_SITE_OTHER): Payer: Medicare Other | Admitting: Orthopedic Surgery

## 2018-05-10 DIAGNOSIS — T84012D Broken internal right knee prosthesis, subsequent encounter: Secondary | ICD-10-CM | POA: Diagnosis not present

## 2018-05-10 DIAGNOSIS — E1140 Type 2 diabetes mellitus with diabetic neuropathy, unspecified: Secondary | ICD-10-CM | POA: Diagnosis not present

## 2018-05-10 DIAGNOSIS — E785 Hyperlipidemia, unspecified: Secondary | ICD-10-CM | POA: Diagnosis not present

## 2018-05-10 DIAGNOSIS — E1122 Type 2 diabetes mellitus with diabetic chronic kidney disease: Secondary | ICD-10-CM | POA: Diagnosis not present

## 2018-05-10 DIAGNOSIS — K219 Gastro-esophageal reflux disease without esophagitis: Secondary | ICD-10-CM | POA: Diagnosis not present

## 2018-05-10 DIAGNOSIS — I509 Heart failure, unspecified: Secondary | ICD-10-CM | POA: Diagnosis not present

## 2018-05-10 DIAGNOSIS — N182 Chronic kidney disease, stage 2 (mild): Secondary | ICD-10-CM | POA: Diagnosis not present

## 2018-05-10 DIAGNOSIS — G4733 Obstructive sleep apnea (adult) (pediatric): Secondary | ICD-10-CM | POA: Diagnosis not present

## 2018-05-10 DIAGNOSIS — Z7982 Long term (current) use of aspirin: Secondary | ICD-10-CM | POA: Diagnosis not present

## 2018-05-10 DIAGNOSIS — Z794 Long term (current) use of insulin: Secondary | ICD-10-CM | POA: Diagnosis not present

## 2018-05-10 DIAGNOSIS — I130 Hypertensive heart and chronic kidney disease with heart failure and stage 1 through stage 4 chronic kidney disease, or unspecified chronic kidney disease: Secondary | ICD-10-CM | POA: Diagnosis not present

## 2018-05-11 ENCOUNTER — Encounter (INDEPENDENT_AMBULATORY_CARE_PROVIDER_SITE_OTHER): Payer: Self-pay | Admitting: Orthopedic Surgery

## 2018-05-11 ENCOUNTER — Ambulatory Visit (INDEPENDENT_AMBULATORY_CARE_PROVIDER_SITE_OTHER): Payer: Medicare Other

## 2018-05-11 ENCOUNTER — Ambulatory Visit (INDEPENDENT_AMBULATORY_CARE_PROVIDER_SITE_OTHER): Payer: Medicare Other | Admitting: Orthopedic Surgery

## 2018-05-11 VITALS — Ht 63.0 in | Wt 334.0 lb

## 2018-05-11 DIAGNOSIS — K219 Gastro-esophageal reflux disease without esophagitis: Secondary | ICD-10-CM | POA: Diagnosis not present

## 2018-05-11 DIAGNOSIS — E1140 Type 2 diabetes mellitus with diabetic neuropathy, unspecified: Secondary | ICD-10-CM | POA: Diagnosis not present

## 2018-05-11 DIAGNOSIS — T84018S Broken internal joint prosthesis, other site, sequela: Secondary | ICD-10-CM

## 2018-05-11 DIAGNOSIS — Z7982 Long term (current) use of aspirin: Secondary | ICD-10-CM | POA: Diagnosis not present

## 2018-05-11 DIAGNOSIS — Z96651 Presence of right artificial knee joint: Secondary | ICD-10-CM

## 2018-05-11 DIAGNOSIS — E1122 Type 2 diabetes mellitus with diabetic chronic kidney disease: Secondary | ICD-10-CM | POA: Diagnosis not present

## 2018-05-11 DIAGNOSIS — N182 Chronic kidney disease, stage 2 (mild): Secondary | ICD-10-CM | POA: Diagnosis not present

## 2018-05-11 DIAGNOSIS — E785 Hyperlipidemia, unspecified: Secondary | ICD-10-CM | POA: Diagnosis not present

## 2018-05-11 DIAGNOSIS — I130 Hypertensive heart and chronic kidney disease with heart failure and stage 1 through stage 4 chronic kidney disease, or unspecified chronic kidney disease: Secondary | ICD-10-CM | POA: Diagnosis not present

## 2018-05-11 DIAGNOSIS — Z96659 Presence of unspecified artificial knee joint: Secondary | ICD-10-CM

## 2018-05-11 DIAGNOSIS — I509 Heart failure, unspecified: Secondary | ICD-10-CM | POA: Diagnosis not present

## 2018-05-11 DIAGNOSIS — Z794 Long term (current) use of insulin: Secondary | ICD-10-CM | POA: Diagnosis not present

## 2018-05-11 DIAGNOSIS — T84012D Broken internal right knee prosthesis, subsequent encounter: Secondary | ICD-10-CM | POA: Diagnosis not present

## 2018-05-11 DIAGNOSIS — G4733 Obstructive sleep apnea (adult) (pediatric): Secondary | ICD-10-CM | POA: Diagnosis not present

## 2018-05-11 MED ORDER — OXYCODONE-ACETAMINOPHEN 10-325 MG PO TABS: 1.0000 | ORAL_TABLET | Freq: Three times a day (TID) | ORAL | 0 refills | Status: DC | PRN

## 2018-05-11 NOTE — Progress Notes (Signed)
Office Visit Note   Patient: [name]           Date of Birth: Jul 02, 1966           MRN: [id number] Visit Date: 05/11/2018              Requested by: [facility], [facility] [address] [place], [place] [address] PCP: [facility], [facility]  Chief Complaint  Patient presents with  . Right Knee - Routine Post Op    02/17/18 total knee revision       HPI: Patient presents in follow-up status post revision total knee arthroplasty on the right.  Patient denies any specific trauma denies any acute injury  Assessment & Plan: Visit Diagnoses:  1. Status post revision of total replacement of right knee   2. Failed total knee replacement, sequela     Plan: Discussed with the patient she has a dislocation of the total knee arthroplasty.  This may be due to collateral ligament laxity laterally from her chronically unstable total knee arthroplasty or may be due to the femoral component jumping over the polyethylene post.  Discussed with we may be able to resolve the problem with a thicker polyethylene tray or may require a revision to a hinged total knee arthroplasty.  Discussed that with the hinged total knee arthroplasty we would need to change out the femoral component we would be able to reuse the tibial component.  Discussed the patient does have increased risks with surgery.  She states she understands and wishes to proceed at this time.  Follow-Up Instructions: Return in about 2 weeks (around 05/25/2018).   Ortho Exam  Patient is alert, oriented, no adenopathy, well-dressed, normal affect, normal respiratory effort. Examination patient is able to hold a straight leg raise without an extensor lag.  Her patella is more prominent but midline.  Review of the radiographs shows an anterior dislocation of the femoral on the tibial component consistent with the femoral component jumping over the post this may be due to collateral ligamentous laxity  or laxity of the extensor mechanism.  Patient is able to do a straight leg raise.  There is no redness no cellulitis no effusion no signs of infection.  Patient's foot has good capillary refill.  No clinical signs of popliteal injury.  Imaging: Xr Knee 1-2 Views Right  Result Date: 05/11/2018 2 view radiographs of the right knee shows dislocation of a total knee arthroplasty the femoral component jumped over the post.  No images are attached to the encounter.  Labs: Lab Results  Component Value Date   HGBA1C 11.3 (H) 01/03/2013   HGBA1C 8.6 (H) 03/05/2011   HGBA1C (H) 03/03/2010    9.8 (NOTE)                                                                       According to the ADA Clinical Practice Recommendations for 2011, when HbA1c is used as a screening test:   >=6.5%   Diagnostic of Diabetes Mellitus           (if abnormal result  is confirmed)  5.7-6.4%   Increased risk of developing Diabetes Mellitus  References:Diagnosis and Classification of Diabetes Mellitus,Diabetes  CVEL,3810,17(PZWCH 1):S62-S69 and Standards of Medical Care in         Diabetes - 2011,Diabetes ENID,7824,23  (Suppl 1):S11-S61.   REPTSTATUS 04/16/2011 FINAL 04/10/2011   CULT NO GROWTH 5 DAYS 04/10/2011     Lab Results  Component Value Date   ALBUMIN 2.9 (L) 02/10/2018   ALBUMIN 2.5 (L) 11/10/2017   ALBUMIN 3.0 (L) 05/14/2014    Body mass index is 59.17 kg/m.  Orders:  Orders Placed This Encounter  Procedures  . XR Knee 1-2 Views Right   Meds ordered this encounter  Medications  . oxyCODONE-acetaminophen (PERCOCET) 10-325 MG tablet    Sig: Take 1 tablet by mouth every 8 (eight) hours as needed for pain.    Dispense:  21 tablet    Refill:  0     Procedures: No procedures performed  Clinical Data: No additional findings.  ROS:  All other systems negative, except as noted in the HPI. Review of Systems  Objective: Vital Signs: Ht 5\' 3"  (1.6 m)   Wt (!) 334 lb (151.5 kg)   LMP  11/09/2012   BMI 59.17 kg/m   Specialty Comments:  No specialty comments available.  PMFS History: Patient Active Problem List   Diagnosis Date Noted  . S/P revision of total knee 02/17/2018  . Failed total right knee replacement ([place])   . Chronic renal insufficiency, stage 2 (mild) 11/10/2017  . Acute CHF (congestive heart failure) ([place]) 11/10/2017  . Failed total knee replacement, sequela 08/31/2017  . Lower extremity cellulitis 01/03/2013  . Diabetes mellitus ([place]) 01/03/2013  . Morbid (severe) obesity due to excess calories ([place]) 11/12/2006  . TOBACCO DEPENDENCE 11/12/2006  . DEPRESSIVE DISORDER, NOS 11/12/2006  . HYPERTENSION, BENIGN SYSTEMIC 11/12/2006  . GASTROESOPHAGEAL REFLUX, NO ESOPHAGITIS 11/12/2006  . ACNE 11/12/2006  . OSTEOARTHRITIS, LOWER LEG 11/12/2006  . APNEA, SLEEP 11/12/2006   Past Medical History:  Diagnosis Date  . Arthritis    "hands, arms, feet, back, knees" (02/17/2018)  . Cardiomyopathy [facility])    From Dr. Bonney Roussel office notes from 01/13/18  . CHF (congestive heart failure) ([place])   . Chronic lower back pain   . Diabetic neuropathy ([place])   . Family history of adverse reaction to anesthesia    Mother is hard to arouse and blood pressure drops  . GERD (gastroesophageal reflux disease)   . High cholesterol   . Hypertension   . Sleep apnea    "need new machine"  - does not use a cpap (02/17/2018)  . Type II diabetes mellitus ([place])    no longer on medications (02/17/2018)    Family History  Problem Relation Age of Onset  . Kidney disease Mother   . Congestive Heart Failure Mother   . Hypertension Father     Past Surgical History:  Procedure Laterality Date  . ANTERIOR CERVICAL DECOMP/DISCECTOMY FUSION  03/06/2010   C4-7 w/corpectomy/notes 03/17/2010  . BACK SURGERY    . CARPAL TUNNEL RELEASE Right   . HARDWARE REVISION  04/10/2010   cervical hardware revision screw replacement C4/notes 04/12/2010  . JOINT REPLACEMENT    . LAPAROSCOPIC  CHOLECYSTECTOMY  1999  . LUMBAR LAMINECTOMY/DECOMPRESSION MICRODISCECTOMY  09/2010   Archie Endo 10/05/2010  . MULTIPLE EXTRACTIONS WITH ALVEOLOPLASTY [name] 01/19/2015   Procedure: MULTIPLE EXTRACTIONS Three, Four, Five, Seven, eight, nine, ten, eleven, twelve, fourteen, eighteen, twenty-one, twenty-eight, twenty-nine, thirty-one with Alveoloplasty.  ;  Surgeon: Diona Browner, DDS;  Location: [facility];  Service: Oral Surgery;  Laterality: [name];  . REPLACEMENT TOTAL KNEE  BILATERAL Bilateral 10/2007-03/2008   "left-right"  . TOENAIL EXCISION Bilateral    great toes  . TONSILLECTOMY    . TOTAL KNEE REVISION Right 02/17/2018  . TOTAL KNEE REVISION Right 02/17/2018   Procedure: RIGHT TOTAL KNEE REVISION;  Surgeon: Newt Minion, MD;  Location: [facility];  Service: Orthopedics;  Laterality: Right;   Social History   Occupational History  . Not on file  Tobacco Use  . Smoking status: Current Every Day Smoker    Packs/day: 0.50    Years: 32.00    Pack years: 16.00    Types: Cigarettes  . Smokeless tobacco: Never Used  Substance and Sexual Activity  . Alcohol use: Not Currently  . Drug use: Not Currently    Types: Marijuana  . Sexual activity: Not Currently

## 2018-05-12 ENCOUNTER — Ambulatory Visit (INDEPENDENT_AMBULATORY_CARE_PROVIDER_SITE_OTHER): Payer: Self-pay | Admitting: Physician Assistant

## 2018-05-12 DIAGNOSIS — E782 Mixed hyperlipidemia: Secondary | ICD-10-CM | POA: Diagnosis not present

## 2018-05-12 DIAGNOSIS — M25561 Pain in right knee: Secondary | ICD-10-CM | POA: Diagnosis not present

## 2018-05-12 DIAGNOSIS — I10 Essential (primary) hypertension: Secondary | ICD-10-CM | POA: Diagnosis not present

## 2018-05-13 ENCOUNTER — Ambulatory Visit (INDEPENDENT_AMBULATORY_CARE_PROVIDER_SITE_OTHER): Payer: Self-pay | Admitting: Physician Assistant

## 2018-05-13 ENCOUNTER — Encounter (HOSPITAL_COMMUNITY): Payer: Self-pay | Admitting: *Deleted

## 2018-05-13 ENCOUNTER — Telehealth (INDEPENDENT_AMBULATORY_CARE_PROVIDER_SITE_OTHER): Payer: Self-pay | Admitting: Orthopedic Surgery

## 2018-05-13 ENCOUNTER — Other Ambulatory Visit: Payer: Self-pay

## 2018-05-13 MED ORDER — DEXTROSE 5 % IV SOLN
3.0000 g | INTRAVENOUS | Status: AC
  Administered 2018-05-14: 3 g via INTRAVENOUS
  Filled 2018-05-13: qty 3

## 2018-05-13 NOTE — Telephone Encounter (Signed)
Talked with Fritz Pickerel at [facility] and advised him that Rx is correct for patient per Dr. Sharol Given, #21

## 2018-05-13 NOTE — Telephone Encounter (Signed)
Yes rx is correct, #21

## 2018-05-13 NOTE — Telephone Encounter (Signed)
Laura-Pharmacist from [facility] called stating patient is bringing a Rx in for Percocet 10-325 to be filled. Mickel Baas wanted to make sure that Dr Sharol Given was aware. Mickel Baas said usually the Rx is filled for Percocet 5-325. The number to contact Mickel Baas is [phone]

## 2018-05-13 NOTE — Telephone Encounter (Signed)
Can you please clarify on message below concerning Rx for Percocet.

## 2018-05-14 ENCOUNTER — Inpatient Hospital Stay (HOSPITAL_COMMUNITY): Payer: Medicare Other | Admitting: Certified Registered"

## 2018-05-14 ENCOUNTER — Inpatient Hospital Stay (HOSPITAL_COMMUNITY)
Admission: RE | Admit: 2018-05-14 | Discharge: 2018-05-18 | DRG: 467 | Disposition: A | Discharge status: Home Health | Payer: Medicare Other | Source: Ambulatory Visit | Attending: Orthopedic Surgery | Admitting: Orthopedic Surgery

## 2018-05-14 ENCOUNTER — Encounter (HOSPITAL_COMMUNITY)
Admission: RE | Disposition: A | Discharge status: Home Health | Payer: Self-pay | Source: Ambulatory Visit | Attending: Orthopedic Surgery

## 2018-05-14 DIAGNOSIS — G8918 Other acute postprocedural pain: Secondary | ICD-10-CM | POA: Diagnosis not present

## 2018-05-14 DIAGNOSIS — Z6841 Body Mass Index (BMI) 40.0 and over, adult: Secondary | ICD-10-CM

## 2018-05-14 DIAGNOSIS — Z841 Family history of disorders of kidney and ureter: Secondary | ICD-10-CM

## 2018-05-14 DIAGNOSIS — N182 Chronic kidney disease, stage 2 (mild): Secondary | ICD-10-CM | POA: Diagnosis not present

## 2018-05-14 DIAGNOSIS — Z96652 Presence of left artificial knee joint: Secondary | ICD-10-CM | POA: Diagnosis not present

## 2018-05-14 DIAGNOSIS — E7800 Pure hypercholesterolemia, unspecified: Secondary | ICD-10-CM | POA: Diagnosis present

## 2018-05-14 DIAGNOSIS — I110 Hypertensive heart disease with heart failure: Secondary | ICD-10-CM | POA: Diagnosis present

## 2018-05-14 DIAGNOSIS — T84022A Instability of internal right knee prosthesis, initial encounter: Secondary | ICD-10-CM | POA: Diagnosis not present

## 2018-05-14 DIAGNOSIS — Q762 Congenital spondylolisthesis: Secondary | ICD-10-CM | POA: Diagnosis not present

## 2018-05-14 DIAGNOSIS — T84012A Broken internal right knee prosthesis, initial encounter: Secondary | ICD-10-CM

## 2018-05-14 DIAGNOSIS — T84018S Broken internal joint prosthesis, other site, sequela: Secondary | ICD-10-CM

## 2018-05-14 DIAGNOSIS — S83104S Unspecified dislocation of right knee, sequela: Secondary | ICD-10-CM | POA: Diagnosis not present

## 2018-05-14 DIAGNOSIS — S83104A Unspecified dislocation of right knee, initial encounter: Secondary | ICD-10-CM | POA: Diagnosis not present

## 2018-05-14 DIAGNOSIS — Y838 Other surgical procedures as the cause of abnormal reaction of the patient, or of later complication, without mention of misadventure at the time of the procedure: Secondary | ICD-10-CM | POA: Diagnosis present

## 2018-05-14 DIAGNOSIS — I129 Hypertensive chronic kidney disease with stage 1 through stage 4 chronic kidney disease, or unspecified chronic kidney disease: Secondary | ICD-10-CM | POA: Diagnosis not present

## 2018-05-14 DIAGNOSIS — M1710 Unilateral primary osteoarthritis, unspecified knee: Secondary | ICD-10-CM | POA: Diagnosis not present

## 2018-05-14 DIAGNOSIS — Z96651 Presence of right artificial knee joint: Secondary | ICD-10-CM

## 2018-05-14 DIAGNOSIS — G4730 Sleep apnea, unspecified: Secondary | ICD-10-CM | POA: Diagnosis not present

## 2018-05-14 DIAGNOSIS — M2550 Pain in unspecified joint: Secondary | ICD-10-CM | POA: Diagnosis not present

## 2018-05-14 DIAGNOSIS — I509 Heart failure, unspecified: Secondary | ICD-10-CM | POA: Diagnosis not present

## 2018-05-14 DIAGNOSIS — F17210 Nicotine dependence, cigarettes, uncomplicated: Secondary | ICD-10-CM | POA: Diagnosis present

## 2018-05-14 DIAGNOSIS — Z96659 Presence of unspecified artificial knee joint: Secondary | ICD-10-CM | POA: Diagnosis not present

## 2018-05-14 DIAGNOSIS — Z7401 Bed confinement status: Secondary | ICD-10-CM | POA: Diagnosis not present

## 2018-05-14 DIAGNOSIS — Z8249 Family history of ischemic heart disease and other diseases of the circulatory system: Secondary | ICD-10-CM | POA: Diagnosis not present

## 2018-05-14 HISTORY — PX: TOTAL KNEE REVISION: SHX996

## 2018-05-14 LAB — BASIC METABOLIC PANEL
ANION GAP: 8 (ref 5–15)
BUN: 13 mg/dL (ref 6–20)
CALCIUM: 8.6 mg/dL — AB (ref 8.9–10.3)
CO2: 25 mmol/L (ref 22–32)
Chloride: 105 mmol/L (ref 98–111)
Creatinine, Ser: 0.91 mg/dL (ref 0.44–1.00)
GFR calc non Af Amer: >60 mL/min (ref >60)
GLUCOSE: 185 mg/dL — AB (ref 70–99)
Potassium: 3.5 mmol/L (ref 3.5–5.1)
SODIUM: 138 mmol/L (ref 135–145)

## 2018-05-14 LAB — CBC
HCT: 40.8 % (ref 36.0–46.0)
Hemoglobin: 12.8 g/dL (ref 12.0–15.0)
MCH: 31.2 pg (ref 26.0–34.0)
MCHC: 31.4 g/dL (ref 30.0–36.0)
MCV: 99.5 fL (ref 78.0–100.0)
PLATELETS: 240 10*3/uL (ref 150–400)
RBC: 4.1 MIL/uL (ref 3.87–5.11)
RDW: 13.2 % (ref 11.5–15.5)
WBC: 7.5 10*3/uL (ref 4.0–10.5)

## 2018-05-14 LAB — HEMOGLOBIN A1C
Hgb A1c MFr Bld: 7.1 % — ABNORMAL HIGH (ref 4.8–5.6)
Mean Plasma Glucose: 157.07 mg/dL

## 2018-05-14 LAB — TYPE AND SCREEN
ABO/RH(D): O POS
Antibody Screen: NEGATIVE

## 2018-05-14 LAB — GLUCOSE, CAPILLARY: GLUCOSE-CAPILLARY: 205 mg/dL — AB (ref 70–99)

## 2018-05-14 SURGERY — TOTAL KNEE REVISION
Anesthesia: General | Laterality: Right

## 2018-05-14 MED ORDER — FENTANYL CITRATE (PF) 250 MCG/5ML IJ SOLN
INTRAMUSCULAR | Status: AC
  Filled 2018-05-14: qty 5

## 2018-05-14 MED ORDER — METHOCARBAMOL 1000 MG/10ML IJ SOLN
500.0000 mg | Freq: Four times a day (QID) | INTRAVENOUS | Status: DC | PRN
  Filled 2018-05-14: qty 5

## 2018-05-14 MED ORDER — MIDAZOLAM HCL 2 MG/2ML IJ SOLN
1.0000 mg | Freq: Once | INTRAMUSCULAR | Status: AC
  Administered 2018-05-14: 1 mg via INTRAVENOUS

## 2018-05-14 MED ORDER — MAGNESIUM CITRATE PO SOLN: 1.0000 | Freq: Once | ORAL | Status: DC | PRN

## 2018-05-14 MED ORDER — ONDANSETRON HCL 4 MG/2ML IJ SOLN
INTRAMUSCULAR | Status: DC | PRN
  Administered 2018-05-14: 4 mg via INTRAVENOUS

## 2018-05-14 MED ORDER — OXYCODONE HCL 5 MG PO TABS: 5.0000 mg | ORAL_TABLET | ORAL | Status: DC | PRN

## 2018-05-14 MED ORDER — OXYCODONE HCL 5 MG PO TABS
ORAL_TABLET | ORAL | Status: AC
  Administered 2018-05-14: 15 mg via ORAL
  Filled 2018-05-14: qty 3

## 2018-05-14 MED ORDER — CEFAZOLIN SODIUM-DEXTROSE 2-4 GM/100ML-% IV SOLN
2.0000 g | Freq: Four times a day (QID) | INTRAVENOUS | Status: AC
  Administered 2018-05-14 (×2): 2 g via INTRAVENOUS
  Filled 2018-05-14 (×3): qty 100

## 2018-05-14 MED ORDER — METOPROLOL SUCCINATE ER 25 MG PO TB24
37.5000 mg | ORAL_TABLET | Freq: Every day | ORAL | Status: DC
  Administered 2018-05-15 – 2018-05-16 (×2): 37.5 mg via ORAL
  Filled 2018-05-14 (×5): qty 1

## 2018-05-14 MED ORDER — HYDROMORPHONE HCL 1 MG/ML IJ SOLN
0.5000 mg | INTRAMUSCULAR | Status: DC | PRN
  Administered 2018-05-14 – 2018-05-16 (×4): 1 mg via INTRAVENOUS
  Filled 2018-05-14 (×4): qty 1

## 2018-05-14 MED ORDER — PHENYLEPHRINE HCL 10 MG/ML IJ SOLN
INTRAMUSCULAR | Status: DC | PRN
  Administered 2018-05-14: 120 ug via INTRAVENOUS

## 2018-05-14 MED ORDER — DEXAMETHASONE SODIUM PHOSPHATE 10 MG/ML IJ SOLN
INTRAMUSCULAR | Status: DC | PRN
  Administered 2018-05-14: 10 mg via INTRAVENOUS

## 2018-05-14 MED ORDER — PROPOFOL 10 MG/ML IV BOLUS
INTRAVENOUS | Status: DC | PRN
  Administered 2018-05-14: 50 mg via INTRAVENOUS
  Administered 2018-05-14: 150 mg via INTRAVENOUS

## 2018-05-14 MED ORDER — DOCUSATE SODIUM 100 MG PO CAPS
100.0000 mg | ORAL_CAPSULE | Freq: Two times a day (BID) | ORAL | Status: DC
  Administered 2018-05-14 – 2018-05-18 (×8): 100 mg via ORAL
  Filled 2018-05-14 (×8): qty 1

## 2018-05-14 MED ORDER — LISINOPRIL-HYDROCHLOROTHIAZIDE 20-25 MG PO TABS: 1.0000 | ORAL_TABLET | Freq: Every day | ORAL | Status: DC

## 2018-05-14 MED ORDER — PHENOL 1.4 % MT LIQD: 1.0000 | OROMUCOSAL | Status: DC | PRN

## 2018-05-14 MED ORDER — 0.9 % SODIUM CHLORIDE (POUR BTL) OPTIME
TOPICAL | Status: DC | PRN
  Administered 2018-05-14: 1000 mL

## 2018-05-14 MED ORDER — ONDANSETRON HCL 4 MG/2ML IJ SOLN
INTRAMUSCULAR | Status: AC
  Filled 2018-05-14: qty 2

## 2018-05-14 MED ORDER — BISACODYL 10 MG RE SUPP
10.0000 mg | Freq: Every day | RECTAL | Status: DC | PRN
  Administered 2018-05-17: 10 mg via RECTAL
  Filled 2018-05-14: qty 1

## 2018-05-14 MED ORDER — METHOCARBAMOL 500 MG PO TABS
500.0000 mg | ORAL_TABLET | Freq: Four times a day (QID) | ORAL | Status: DC | PRN
  Administered 2018-05-14 – 2018-05-18 (×8): 500 mg via ORAL
  Filled 2018-05-14 (×7): qty 1

## 2018-05-14 MED ORDER — ONDANSETRON HCL 4 MG PO TABS: 4.0000 mg | ORAL_TABLET | Freq: Four times a day (QID) | ORAL | Status: DC | PRN

## 2018-05-14 MED ORDER — ATORVASTATIN CALCIUM 40 MG PO TABS
40.0000 mg | ORAL_TABLET | Freq: Every day | ORAL | Status: DC
  Administered 2018-05-15 – 2018-05-18 (×4): 40 mg via ORAL
  Filled 2018-05-14 (×4): qty 1

## 2018-05-14 MED ORDER — SODIUM CHLORIDE 0.9 % IV SOLN
INTRAVENOUS | Status: DC
  Administered 2018-05-14: 14:00:00 via INTRAVENOUS

## 2018-05-14 MED ORDER — TRANEXAMIC ACID 1000 MG/10ML IV SOLN
1000.0000 mg | INTRAVENOUS | Status: AC
  Administered 2018-05-14: 1000 mg via INTRAVENOUS
  Filled 2018-05-14: qty 1000

## 2018-05-14 MED ORDER — ALBUMIN HUMAN 5 % IV SOLN
INTRAVENOUS | Status: DC | PRN
  Administered 2018-05-14 (×3): via INTRAVENOUS

## 2018-05-14 MED ORDER — MIDAZOLAM HCL 2 MG/2ML IJ SOLN
INTRAMUSCULAR | Status: AC
  Administered 2018-05-14: 1 mg via INTRAVENOUS
  Filled 2018-05-14: qty 2

## 2018-05-14 MED ORDER — CHLORHEXIDINE GLUCONATE 4 % EX LIQD: 60.0000 mL | Freq: Once | CUTANEOUS | Status: DC

## 2018-05-14 MED ORDER — METOCLOPRAMIDE HCL 5 MG/ML IJ SOLN: 5.0000 mg | Freq: Three times a day (TID) | INTRAMUSCULAR | Status: DC | PRN

## 2018-05-14 MED ORDER — LIDOCAINE 2% (20 MG/ML) 5 ML SYRINGE
INTRAMUSCULAR | Status: DC | PRN
  Administered 2018-05-14: 60 mg via INTRAVENOUS

## 2018-05-14 MED ORDER — ROPIVACAINE HCL 7.5 MG/ML IJ SOLN
INTRAMUSCULAR | Status: DC | PRN
  Administered 2018-05-14: 20 mL via PERINEURAL

## 2018-05-14 MED ORDER — MENTHOL 3 MG MT LOZG: 1.0000 | LOZENGE | OROMUCOSAL | Status: DC | PRN

## 2018-05-14 MED ORDER — HYDROMORPHONE HCL 1 MG/ML IJ SOLN
0.2500 mg | INTRAMUSCULAR | Status: DC | PRN
  Administered 2018-05-14 (×4): 0.5 mg via INTRAVENOUS

## 2018-05-14 MED ORDER — TRANEXAMIC ACID 1000 MG/10ML IV SOLN
2000.0000 mg | Freq: Once | INTRAVENOUS | Status: DC
  Filled 2018-05-14: qty 20

## 2018-05-14 MED ORDER — HYDROMORPHONE HCL 1 MG/ML IJ SOLN
INTRAMUSCULAR | Status: AC
  Administered 2018-05-14: 0.5 mg via INTRAVENOUS
  Filled 2018-05-14: qty 1

## 2018-05-14 MED ORDER — PHENYLEPHRINE 40 MCG/ML (10ML) SYRINGE FOR IV PUSH (FOR BLOOD PRESSURE SUPPORT)
PREFILLED_SYRINGE | INTRAVENOUS | Status: AC
  Filled 2018-05-14: qty 10

## 2018-05-14 MED ORDER — TRANEXAMIC ACID 1000 MG/10ML IV SOLN
2000.0000 mg | Freq: Once | INTRAVENOUS | Status: AC
  Administered 2018-05-14: 2000 mg via TOPICAL
  Filled 2018-05-14: qty 20

## 2018-05-14 MED ORDER — ONDANSETRON HCL 4 MG/2ML IJ SOLN: 4.0000 mg | Freq: Four times a day (QID) | INTRAMUSCULAR | Status: DC | PRN

## 2018-05-14 MED ORDER — METHOCARBAMOL 500 MG PO TABS
ORAL_TABLET | ORAL | Status: AC
  Administered 2018-05-14: 500 mg via ORAL
  Filled 2018-05-14: qty 1

## 2018-05-14 MED ORDER — LACTATED RINGERS IV SOLN
INTRAVENOUS | Status: DC | PRN
  Administered 2018-05-14: 10:00:00 via INTRAVENOUS

## 2018-05-14 MED ORDER — METOCLOPRAMIDE HCL 5 MG PO TABS: 5.0000 mg | ORAL_TABLET | Freq: Three times a day (TID) | ORAL | Status: DC | PRN

## 2018-05-14 MED ORDER — CLONIDINE HCL (ANALGESIA) 100 MCG/ML EP SOLN
EPIDURAL | Status: DC | PRN
  Administered 2018-05-14: 50 ug

## 2018-05-14 MED ORDER — DEXAMETHASONE SODIUM PHOSPHATE 10 MG/ML IJ SOLN
INTRAMUSCULAR | Status: AC
  Filled 2018-05-14: qty 1

## 2018-05-14 MED ORDER — HYDROMORPHONE HCL 1 MG/ML IJ SOLN
INTRAMUSCULAR | Status: DC | PRN
  Administered 2018-05-14 (×2): .2 mg via INTRAVENOUS
  Administered 2018-05-14: .1 mg via INTRAVENOUS

## 2018-05-14 MED ORDER — HYDROMORPHONE HCL 1 MG/ML IJ SOLN
INTRAMUSCULAR | Status: AC
  Filled 2018-05-14: qty 0.5

## 2018-05-14 MED ORDER — OXYCODONE HCL 5 MG PO TABS
10.0000 mg | ORAL_TABLET | ORAL | Status: DC | PRN
  Administered 2018-05-14 – 2018-05-18 (×17): 15 mg via ORAL
  Filled 2018-05-14 (×16): qty 3

## 2018-05-14 MED ORDER — ASPIRIN EC 325 MG PO TBEC
325.0000 mg | DELAYED_RELEASE_TABLET | Freq: Every day | ORAL | Status: DC
  Administered 2018-05-15 – 2018-05-18 (×4): 325 mg via ORAL
  Filled 2018-05-14 (×4): qty 1

## 2018-05-14 MED ORDER — NICOTINE 14 MG/24HR TD PT24
14.0000 mg | MEDICATED_PATCH | Freq: Every day | TRANSDERMAL | Status: DC
  Administered 2018-05-14 – 2018-05-18 (×5): 14 mg via TRANSDERMAL
  Filled 2018-05-14 (×5): qty 1

## 2018-05-14 MED ORDER — ACETAMINOPHEN 325 MG PO TABS
325.0000 mg | ORAL_TABLET | Freq: Four times a day (QID) | ORAL | Status: DC | PRN
  Administered 2018-05-15 – 2018-05-18 (×5): 650 mg via ORAL
  Filled 2018-05-14 (×6): qty 2

## 2018-05-14 MED ORDER — FUROSEMIDE 20 MG PO TABS
20.0000 mg | ORAL_TABLET | Freq: Every day | ORAL | Status: DC
  Administered 2018-05-15 – 2018-05-18 (×3): 20 mg via ORAL
  Filled 2018-05-14 (×4): qty 1

## 2018-05-14 MED ORDER — MIDAZOLAM HCL 2 MG/2ML IJ SOLN
INTRAMUSCULAR | Status: AC
  Filled 2018-05-14: qty 2

## 2018-05-14 MED ORDER — FENTANYL CITRATE (PF) 100 MCG/2ML IJ SOLN
50.0000 ug | Freq: Once | INTRAMUSCULAR | Status: AC
  Administered 2018-05-14: 50 ug via INTRAVENOUS

## 2018-05-14 MED ORDER — PROMETHAZINE HCL 25 MG/ML IJ SOLN: 6.2500 mg | INTRAMUSCULAR | Status: DC | PRN

## 2018-05-14 MED ORDER — FENTANYL CITRATE (PF) 100 MCG/2ML IJ SOLN
INTRAMUSCULAR | Status: AC
  Administered 2018-05-14: 50 ug via INTRAVENOUS
  Filled 2018-05-14: qty 2

## 2018-05-14 MED ORDER — MORPHINE SULFATE (PF) 2 MG/ML IV SOLN
INTRAVENOUS | Status: AC
  Filled 2018-05-14: qty 1

## 2018-05-14 MED ORDER — PROPOFOL 10 MG/ML IV BOLUS
INTRAVENOUS | Status: AC
  Filled 2018-05-14: qty 20

## 2018-05-14 MED ORDER — SODIUM CHLORIDE 0.9 % IJ SOLN
INTRAMUSCULAR | Status: AC
  Filled 2018-05-14: qty 10

## 2018-05-14 MED ORDER — DIPHENHYDRAMINE HCL 12.5 MG/5ML PO ELIX: 12.5000 mg | ORAL_SOLUTION | ORAL | Status: DC | PRN

## 2018-05-14 MED ORDER — POLYETHYLENE GLYCOL 3350 17 G PO PACK
17.0000 g | PACK | Freq: Every day | ORAL | Status: DC | PRN
  Administered 2018-05-16 – 2018-05-17 (×2): 17 g via ORAL
  Filled 2018-05-14 (×2): qty 1

## 2018-05-14 MED ORDER — MORPHINE SULFATE (PF) 2 MG/ML IV SOLN
1.0000 mg | INTRAVENOUS | Status: DC | PRN
  Administered 2018-05-14: 1 mg via INTRAVENOUS

## 2018-05-14 MED ORDER — SUCCINYLCHOLINE CHLORIDE 20 MG/ML IJ SOLN
INTRAMUSCULAR | Status: DC | PRN
  Administered 2018-05-14: 140 mg via INTRAVENOUS

## 2018-05-14 MED ORDER — SACUBITRIL-VALSARTAN 24-26 MG PO TABS
1.0000 | ORAL_TABLET | Freq: Two times a day (BID) | ORAL | Status: DC
  Administered 2018-05-14 – 2018-05-18 (×7): 1 via ORAL
  Filled 2018-05-14 (×9): qty 1

## 2018-05-14 MED ORDER — SODIUM CHLORIDE 0.9 % IR SOLN
Status: DC | PRN
  Administered 2018-05-14: 3000 mL

## 2018-05-14 MED ORDER — FENTANYL CITRATE (PF) 100 MCG/2ML IJ SOLN
INTRAMUSCULAR | Status: DC | PRN
  Administered 2018-05-14: 50 ug via INTRAVENOUS
  Administered 2018-05-14 (×2): 100 ug via INTRAVENOUS

## 2018-05-14 SURGICAL SUPPLY — 63 items
BAG DECANTER FOR FLEXI CONT (MISCELLANEOUS) ×3 IMPLANT
BLADE SAW SAG 90X13X1.27 (BLADE) ×3 IMPLANT
BLADE SAW SGTL 13.0X1.19X90.0M (BLADE) ×3 IMPLANT
BLADE SAW SGTL 81X20 HD (BLADE) ×3 IMPLANT
BNDG COHESIVE 6X5 TAN STRL LF (GAUZE/BANDAGES/DRESSINGS) ×6 IMPLANT
BOWL SMART MIX CTS (DISPOSABLE) ×3 IMPLANT
CEMENT BONE REFOBACIN R1X40 US (Cement) ×3 IMPLANT
COVER BACK TABLE 24X17X13 BIG (DRAPES) ×3 IMPLANT
COVER SURGICAL LIGHT HANDLE (MISCELLANEOUS) ×6 IMPLANT
CUFF TOURNIQUET SINGLE 34IN LL (TOURNIQUET CUFF) IMPLANT
CUFF TOURNIQUET SINGLE 44IN (TOURNIQUET CUFF) IMPLANT
DRAPE HALF SHEET 40X57 (DRAPES) ×6 IMPLANT
DRAPE IMP U-DRAPE 54X76 (DRAPES) ×3 IMPLANT
DRAPE U-SHAPE 47X51 STRL (DRAPES) ×3 IMPLANT
DRESSING PREVENA PLUS CUSTOM (GAUZE/BANDAGES/DRESSINGS) ×1 IMPLANT
DRSG ADAPTIC 3X8 NADH LF (GAUZE/BANDAGES/DRESSINGS) ×3 IMPLANT
DRSG PAD ABDOMINAL 8X10 ST (GAUZE/BANDAGES/DRESSINGS) ×3 IMPLANT
DRSG PREVENA PLUS CUSTOM (GAUZE/BANDAGES/DRESSINGS) ×3
DURAPREP 26ML APPLICATOR (WOUND CARE) ×9 IMPLANT
ELECT REM PT RETURN 9FT ADLT (ELECTROSURGICAL) ×3
ELECTRODE REM PT RTRN 9FT ADLT (ELECTROSURGICAL) ×1 IMPLANT
FACESHIELD WRAPAROUND (MASK) ×3 IMPLANT
GAUZE SPONGE 4X4 12PLY STRL (GAUZE/BANDAGES/DRESSINGS) ×3 IMPLANT
GLOVE BIOGEL PI IND STRL 9 (GLOVE) ×1 IMPLANT
GLOVE BIOGEL PI INDICATOR 9 (GLOVE) ×2
GLOVE SURG ORTHO 9.0 STRL STRW (GLOVE) ×3 IMPLANT
GOWN STRL REUS W/ TWL XL LVL3 (GOWN DISPOSABLE) ×3 IMPLANT
GOWN STRL REUS W/TWL XL LVL3 (GOWN DISPOSABLE) ×6
HANDPIECE INTERPULSE COAX TIP (DISPOSABLE) ×2
HINGE FEMUR SM RT SROM (Knees) ×1 IMPLANT
HINGE TIBIAL INSERT 21 SM KNEE (Insert) ×3 IMPLANT
IMMOBILIZER KNEE 20 (SOFTGOODS) ×3
IMMOBILIZER KNEE 20 THIGH 36 (SOFTGOODS) ×1 IMPLANT
KIT BASIN OR (CUSTOM PROCEDURE TRAY) ×3 IMPLANT
KIT TURNOVER KIT B (KITS) ×3 IMPLANT
MANIFOLD NEPTUNE II (INSTRUMENTS) ×3 IMPLANT
NEEDLE SPNL 18GX3.5 QUINCKE PK (NEEDLE) ×3 IMPLANT
NS IRRIG 1000ML POUR BTL (IV SOLUTION) ×3 IMPLANT
PACK TOTAL JOINT (CUSTOM PROCEDURE TRAY) ×3 IMPLANT
PACK UNIVERSAL I (CUSTOM PROCEDURE TRAY) ×3 IMPLANT
PAD ARMBOARD 7.5X6 YLW CONV (MISCELLANEOUS) ×6 IMPLANT
PADDING CAST COTTON 6X4 STRL (CAST SUPPLIES) ×3 IMPLANT
PIN STEINMAN FIXATION KNEE (PIN) ×3 IMPLANT
PREVENA RESTOR ARTHOFORM 33X30 (CANNISTER) ×3 IMPLANT
SET HNDPC FAN SPRY TIP SCT (DISPOSABLE) ×1 IMPLANT
SLEEVE FEM UNIV FULL PRO SZ34 (Sleeve) ×3 IMPLANT
SROM SM RT HINGE FEMUR (Knees) ×3 IMPLANT
STAPLER VISISTAT 35W (STAPLE) ×3 IMPLANT
STEM UNIVERSAL REVISION 115X20 (Stem) ×3 IMPLANT
SUCTION FRAZIER HANDLE 10FR (MISCELLANEOUS) ×2
SUCTION TUBE FRAZIER 10FR DISP (MISCELLANEOUS) ×1 IMPLANT
SUT VIC AB 0 CTB1 27 (SUTURE) IMPLANT
SUT VIC AB 1 CTX 27 (SUTURE) ×3 IMPLANT
SUT VIC AB 1 CTX 36 (SUTURE)
SUT VIC AB 1 CTX36XBRD ANBCTR (SUTURE) IMPLANT
SUT VIC AB 2-0 CTB1 (SUTURE) IMPLANT
SWAB CULTURE ESWAB REG 1ML (MISCELLANEOUS) IMPLANT
TOWEL OR 17X24 6PK STRL BLUE (TOWEL DISPOSABLE) ×3 IMPLANT
TOWEL OR 17X26 10 PK STRL BLUE (TOWEL DISPOSABLE) ×3 IMPLANT
TRAY FOLEY MTR SLVR 16FR STAT (SET/KITS/TRAYS/PACK) IMPLANT
WATER STERILE IRR 1000ML POUR (IV SOLUTION) IMPLANT
WND VAC CANISTER 500ML (MISCELLANEOUS) ×3 IMPLANT
WRAP KNEE MAXI GEL POST OP (GAUZE/BANDAGES/DRESSINGS) ×3 IMPLANT

## 2018-05-14 NOTE — Evaluation (Signed)
Physical Therapy Evaluation Patient Details Name: [name] MRN: [id number] DOB: 01-02-1966 Today's Date: 05/14/2018   History of Present Illness  Pt is a 52 y/o female who is s/p R total knee revision to hinged knee, secondary to R TKA dislocation. PMH includes CHF, cardiomyopathy, HTN, sleep apnea, obesity, DM, and R total knee replacement.   Clinical Impression  Pt is s/p surgery above with deficits below. Pt very limited by pain and tearful throughout session. Practiced coming to long sitting multiple times and practiced supine HEP. Educated about knee precautions and precautions regarding KI use. Pt reports she plans to return home at d/c because she did not have a good experience at [facility]. Will continue to follow acutely to maximize functional mobility independence and safety.     Follow Up Recommendations Follow surgeon's recommendation for DC plan and follow-up therapies;Supervision/Assistance - 24 hour    Equipment Recommendations  3in1 (PT)(bariatric )    Recommendations for Other Services OT consult     Precautions / Restrictions Precautions Precautions: Knee;Other (comment) Precaution Booklet Issued: No Precaution Comments: Assumed no ROM in R knee secondary to KI at all times order.  Required Braces or Orthoses: Knee Immobilizer - Right Knee Immobilizer - Right: On at all times Restrictions Weight Bearing Restrictions: Yes RLE Weight Bearing: Weight bearing as tolerated Other Position/Activity Restrictions: WBAT with KI on       Mobility  Bed Mobility Overal bed mobility: Needs Assistance             General bed mobility comments: Min A to min guard to come to long sitting in bed. Pt did not want to sit at EOB. Heavy reliance on UEs. Practiced coming to long sitting X6   Transfers                 General transfer comment: Pt refusing secondary to pain.   Ambulation/Gait                Stairs            Wheelchair Mobility     Modified Rankin (Stroke Patients Only)       Balance                                             Pertinent Vitals/Pain Pain Assessment: 0-10 Pain Score: 10-Worst pain ever Pain Location: R knee  Pain Descriptors / Indicators: Aching;Operative site guarding Pain Intervention(s): Limited activity within patient's tolerance;Monitored during session;Repositioned    Home Living Family/patient expects to be discharged to:: Private residence Living Arrangements: Children Available Help at Discharge: Family;Personal care attendant Type of Home: House Home Access: Ramped entrance     Home Layout: One level Home Equipment: Environmental consultant - 2 wheels Additional Comments: Requesting to get new 3 in 1 as hers is broken    Prior Function Level of Independence: Needs assistance   Gait / Transfers Assistance Needed: RW for mobility inside the home and then uses electric chair for outside of home.   ADL's / Homemaking Assistance Needed: Requires assist for bathing and ADL tasks; has a CNA who assists 5 hrs/day        Hand Dominance        Extremity/Trunk Assessment   Upper Extremity Assessment Upper Extremity Assessment: Defer to OT evaluation    Lower Extremity Assessment Lower Extremity Assessment: RLE deficits/detail RLE Deficits /  Details: Deficits consistent with post op pain and weakness.        Communication   Communication: No difficulties  Cognition Arousal/Alertness: Awake/alert Behavior During Therapy: WFL for tasks assessed/performed Overall Cognitive Status: Within Functional Limits for tasks assessed                                        General Comments General comments (skin integrity, edema, etc.): Pt's daughter present during session.     Exercises Total Joint Exercises Ankle Circles/Pumps: AROM;Left;10 reps Hip ABduction/ADduction: AAROM;Left;10 reps Other Exercises Other Exercises: Practiced coming to long sitting X  6 using UEs. Pt with heavy reliance on UEs.    Assessment/Plan    PT Assessment Patient needs continued PT services  PT Problem List Decreased strength;Decreased activity tolerance;Decreased balance;Decreased range of motion;Decreased mobility;Decreased knowledge of use of DME;Decreased knowledge of precautions;Pain       PT Treatment Interventions Gait training;DME instruction;Therapeutic activities;Functional mobility training;Therapeutic exercise;Balance training;Patient/family education    PT Goals (Current goals can be found in the Care Plan section)  Acute Rehab PT Goals Patient Stated Goal: "for my knee to heal"  PT Goal Formulation: With patient Time For Goal Achievement: 05/28/18 Potential to Achieve Goals: Fair    Frequency 7X/week   Barriers to discharge        Co-evaluation               AM-PAC PT "6 Clicks" Daily Activity  Outcome Measure Difficulty turning over in bed (including adjusting bedclothes, sheets and blankets)?: Unable Difficulty moving from lying on back to sitting on the side of the bed? : Unable Difficulty sitting down on and standing up from a chair with arms (e.g., wheelchair, bedside commode, etc,.)?: Unable Help needed moving to and from a bed to chair (including a wheelchair)?: Total Help needed walking in hospital room?: Total Help needed climbing 3-5 steps with a railing? : Total 6 Click Score: 6    End of Session Equipment Utilized During Treatment: Right knee immobilizer Activity Tolerance: Patient limited by pain Patient left: in bed;with call bell/phone within reach;with family/visitor present Nurse Communication: Mobility status PT Visit Diagnosis: Unsteadiness on feet (R26.81);Difficulty in walking, not elsewhere classified (R26.2);Muscle weakness (generalized) (M62.81);Pain Pain - Right/Left: Right Pain - part of body: Knee    Time: 0240-9735 PT Time Calculation (min) (ACUTE ONLY): 27 min   Charges:   PT Evaluation $PT  Eval Moderate Complexity: 1 Mod PT Treatments $Therapeutic Activity: 8-22 mins        Leighton Ruff, PT, DPT  Acute Rehabilitation Services  Pager: [phone]   Rudean Hitt 05/14/2018, 5:33 PM

## 2018-05-14 NOTE — Anesthesia Preprocedure Evaluation (Signed)
Anesthesia Evaluation  Patient identified by MRN, date of birth, ID band Patient awake    Reviewed: Allergy & Precautions, NPO status , Patient's Chart, lab work & pertinent test results  Airway Mallampati: II  TM Distance: >3 FB Neck ROM: Full    Dental  (+) Poor Dentition, Missing, Dental Advisory Given   Pulmonary sleep apnea , Current Smoker,    Pulmonary exam normal breath sounds clear to auscultation       Cardiovascular hypertension, Pt. on medications and Pt. on home beta blockers Normal cardiovascular exam Rhythm:Regular Rate:Normal     Neuro/Psych negative neurological ROS  negative psych ROS   GI/Hepatic negative GI ROS, Neg liver ROS,   Endo/Other  diabetesMorbid obesity  Renal/GU negative Renal ROS  negative genitourinary   Musculoskeletal negative musculoskeletal ROS (+)   Abdominal   Peds negative pediatric ROS (+)  Hematology negative hematology ROS (+)   Anesthesia Other Findings   Reproductive/Obstetrics negative OB ROS                             Anesthesia Physical Anesthesia Plan  ASA: III  Anesthesia Plan: General   Post-op Pain Management:  Regional for Post-op pain   Induction: Intravenous  PONV Risk Score and Plan: 2 and Ondansetron, Dexamethasone and Treatment may vary due to age or medical condition  Airway Management Planned: Oral ETT  Additional Equipment:   Intra-op Plan:   Post-operative Plan: Extubation in OR  Informed Consent: I have reviewed the patients History and Physical, chart, labs and discussed the procedure including the risks, benefits and alternatives for the proposed anesthesia with the patient or authorized representative who has indicated his/her understanding and acceptance.   Dental advisory given  Plan Discussed with: CRNA and Surgeon  Anesthesia Plan Comments:         Anesthesia Quick Evaluation

## 2018-05-14 NOTE — Anesthesia Procedure Notes (Signed)
Anesthesia Regional Block: Adductor canal block   Pre-Anesthetic Checklist: ,, timeout performed, Correct Patient, Correct Site, Correct Laterality, Correct Procedure, Correct Position, site marked, Risks and benefits discussed,  Surgical consent,  Pre-op evaluation,  At surgeon's request and post-op pain management  Laterality: Right  Prep: chloraprep       Needles:  Injection technique: Single-shot  Needle Type: Echogenic Needle     Needle Length: 9cm      Additional Needles:   Procedures:,,,, ultrasound used (permanent image in chart),,,,  Narrative:  Start time: 05/14/2018 8:56 AM End time: 05/14/2018 9:06 AM Injection made incrementally with aspirations every 5 mL.  Performed by: Personally  Anesthesiologist: Myrtie Soman, MD  Additional Notes: Patient tolerated the procedure well without complications

## 2018-05-14 NOTE — Plan of Care (Signed)

## 2018-05-14 NOTE — Anesthesia Postprocedure Evaluation (Signed)
Anesthesia Post Note  Patient: [name]  Procedure(s) Performed: RIGHT TOTAL KNEE REVISION TO HINGED KNEE (Right )     Patient location during evaluation: PACU Anesthesia Type: General Level of consciousness: awake and alert Pain management: pain level controlled Vital Signs Assessment: post-procedure vital signs reviewed and stable Respiratory status: spontaneous breathing, nonlabored ventilation, respiratory function stable and patient connected to nasal cannula oxygen Cardiovascular status: blood pressure returned to baseline and stable Postop Assessment: no apparent nausea or vomiting Anesthetic complications: no    Last Vitals:  Vitals:   05/14/18 1205 05/14/18 1225  BP: 115/62   Pulse: 76 75  Resp: 15 18  Temp: (!) 36.2 C   SpO2: 100% 99%    Last Pain:  Vitals:   05/14/18 1225  TempSrc:   PainSc: 10-Worst pain ever                 Caralyn Twining S

## 2018-05-14 NOTE — Anesthesia Procedure Notes (Signed)
Anesthesia Procedure Image    

## 2018-05-14 NOTE — Op Note (Signed)
05/14/2018  12:03 PM  PATIENT:  [name]    PRE-OPERATIVE DIAGNOSIS:  Dislocated Right Total Knee Arthroplasty  POST-OPERATIVE DIAGNOSIS:  Same  PROCEDURE:  RIGHT TOTAL KNEE REVISION TO HINGED KNEE With rotating-hinge cemented femoral component small, universal porous coated sleeve 34 mm, 115 x 20 mm fluted stem, tibial hinged insert 21 mm thick.  Application of the restore incisional wound VAC.  Patient  SURGEON:  Newt Minion, MD  PHYSICIAN ASSISTANT:None ANESTHESIA:   General  PREOPERATIVE INDICATIONS:  [name] is a  52 y.o. female with a diagnosis of Dislocated Right Total Knee Arthroplasty who failed conservative measures and elected for surgical management.    The risks benefits and alternatives were discussed with the patient preoperatively including but not limited to the risks of infection, bleeding, nerve injury, cardiopulmonary complications, the need for revision surgery, among others, and the patient was willing to proceed.  OPERATIVE IMPLANTS: See stored photo     @ENCIMAGES @  OPERATIVE FINDINGS: Patient had a crack through the lateral femoral condyle.  OPERATIVE PROCEDURE: Patient was brought the operating room and underwent a general anesthetic.  After adequate levels of anesthesia were obtained patient's right lower extremity was prepped using DuraPrep draped into a Sterile Fld., Ioban was used to cover all exposed skin a timeout was called patient received 3 g of Kefzol preoperatively.  Her previous midline incision was made this carried down to a medial parapatellar retinacular incision.  The patella was everted.  A saw and osteotome were used to remove the femoral component the tibial tray was removed.  Using the revision instruments the canal was sequentially reamed to 20 mm for the 20 mm fluted stem 115 mm in length.  Using the box cut and tied cuts.  The femoral component distal femur was prepared at 7 degrees of valgus.  This was trialed with a  21 mm polyethylene tray and patient had full extension was stable with varus and valgus.  The trial components were removed the wound was irrigated with pulsatile lavage throughout the case.  The wound was irrigated with TXA.  The femoral component was inserted the polyethylene tray the pin was inserted in the left knee was left in extension until the cement hardened.  The retinaculum was closed using #1 Vicryl skin was closed using 2-0 nylon.  The restore incisional wound VAC was applied this had a good suction fit.  Patient was extubated taken the PACU in stable condition.   DISCHARGE PLANNING:  Antibiotic duration: Internal, 24-hour antibiotics  Weightbearing: Weightbearing as tolerated with knee immobilizer on.  Pain medication: Opioid pathway  Dressing care/ Wound VAC: Continue restore wound VAC for 2 weeks.  Ambulatory devices: Walker.  Discharge to: Anticipate discharge to home.  Patient states she had better care at home and she did not skilled nursing.  Follow-up: In the office 1 week post operative.

## 2018-05-14 NOTE — Transfer of Care (Signed)
Immediate Anesthesia Transfer of Care Note  Patient: [name]  Procedure(s) Performed: RIGHT TOTAL KNEE REVISION TO HINGED KNEE (Right )  Patient Location: PACU  Anesthesia Type:GA combined with regional for post-op pain  Level of Consciousness: awake, alert , oriented and patient cooperative  Airway & Oxygen Therapy: Patient Spontanous Breathing and Patient connected to face mask oxygen  Post-op Assessment: Report given to RN and Post -op Vital signs reviewed and stable  Post vital signs: Reviewed and stable  Last Vitals:  Vitals Value Taken Time  BP 115/62 05/14/2018 12:05 PM  Temp 36.2 C 05/14/2018 12:05 PM  Pulse 78 05/14/2018 12:05 PM  Resp 10 05/14/2018 12:05 PM  SpO2 100 % 05/14/2018 12:05 PM  Vitals shown include unvalidated device data.  Last Pain:  Vitals:   05/14/18 0844  TempSrc:   PainSc: 2       Patients Stated Pain Goal: 2 (36/06/77 0340)  Complications: No apparent anesthesia complications

## 2018-05-14 NOTE — Anesthesia Procedure Notes (Signed)
Procedure Name: Intubation Date/Time: 05/14/2018 10:15 AM Performed by: Lance Coon, CRNA Pre-anesthesia Checklist: Patient identified, Emergency Drugs available, Suction available, Patient being monitored and Timeout performed Patient Re-evaluated:Patient Re-evaluated prior to induction Oxygen Delivery Method: Circle system utilized Preoxygenation: Pre-oxygenation with 100% oxygen Induction Type: IV induction Ventilation: Mask ventilation without difficulty Laryngoscope Size: Miller and 3 Grade View: Grade I Tube type: Oral Tube size: 7.0 mm Number of attempts: 1 Airway Equipment and Method: Stylet Placement Confirmation: ETT inserted through vocal cords under direct vision,  positive ETCO2 and breath sounds checked- equal and bilateral Secured at: 22 cm Tube secured with: Tape Dental Injury: Teeth and Oropharynx as per pre-operative assessment

## 2018-05-14 NOTE — H&P (Signed)
[name] is an 52 y.o. female.   Chief Complaint: Dislocated right total knee arthroplasty. HPI: Patient presents in follow-up status post revision total knee arthroplasty on the right.  Patient denies any specific trauma denies any acute injury, patient complains of deformity of her knee and pain with weightbearing.   Past Medical History:  Diagnosis Date  . Arthritis    "hands, arms, feet, back, knees" (02/17/2018)  . Cardiomyopathy [facility])    From Dr. Bonney Roussel office notes from 01/13/18  . CHF (congestive heart failure) ([place])   . Chronic lower back pain   . Diabetic neuropathy ([place])   . Family history of adverse reaction to anesthesia    Mother is hard to arouse and blood pressure drops  . GERD (gastroesophageal reflux disease)   . High cholesterol   . Hypertension   . Sleep apnea    "need new machine"  - does not use a cpap (02/17/2018)  . Type II diabetes mellitus ([place])    no longer on medications (02/17/2018)    Past Surgical History:  Procedure Laterality Date  . ANTERIOR CERVICAL DECOMP/DISCECTOMY FUSION  03/06/2010   C4-7 w/corpectomy/notes 03/17/2010  . BACK SURGERY    . CARPAL TUNNEL RELEASE Right   . HARDWARE REVISION  04/10/2010   cervical hardware revision screw replacement C4/notes 04/12/2010  . JOINT REPLACEMENT    . LAPAROSCOPIC CHOLECYSTECTOMY  1999  . LUMBAR LAMINECTOMY/DECOMPRESSION MICRODISCECTOMY  09/2010   Archie Endo 10/05/2010  . MULTIPLE EXTRACTIONS WITH ALVEOLOPLASTY [name] 01/19/2015   Procedure: MULTIPLE EXTRACTIONS Three, Four, Five, Seven, eight, nine, ten, eleven, twelve, fourteen, eighteen, twenty-one, twenty-eight, twenty-nine, thirty-one with Alveoloplasty.  ;  Surgeon: Diona Browner, DDS;  Location: [place];  Service: Oral Surgery;  Laterality: [name];  . REPLACEMENT TOTAL KNEE BILATERAL Bilateral 10/2007-03/2008   "left-right"  . TOENAIL EXCISION Bilateral    great toes  . TONSILLECTOMY    . TOTAL KNEE REVISION Right 02/17/2018  . TOTAL KNEE REVISION  Right 02/17/2018   Procedure: RIGHT TOTAL KNEE REVISION;  Surgeon: Newt Minion, MD;  Location: [place];  Service: Orthopedics;  Laterality: Right;    Family History  Problem Relation Age of Onset  . Kidney disease Mother   . Congestive Heart Failure Mother   . Hypertension Father    Social History:  reports that she has been smoking cigarettes. She has a 16.00 pack-year smoking history. She has never used smokeless tobacco. She reports that she drank alcohol. She reports that she has current or past drug history. Drug: Marijuana.  Allergies: No Known Allergies  Medications Prior to Admission  Medication Sig Dispense Refill  . atorvastatin (LIPITOR) 40 MG tablet Take 40 mg by mouth daily.    . furosemide (LASIX) 40 MG tablet Take 1 tablet (40 mg total) by mouth daily. (Patient taking differently: Take 20 mg by mouth daily. ) 30 tablet 0  . lisinopril-hydrochlorothiazide (PRINZIDE,ZESTORETIC) 20-25 MG tablet Take 1 tablet by mouth daily.  3  . metoprolol succinate (TOPROL-XL) 25 MG 24 hr tablet Take 37.5 mg by mouth daily.    Marland Kitchen oxyCODONE-acetaminophen (PERCOCET) 10-325 MG tablet Take 1 tablet by mouth every 8 (eight) hours as needed for pain. 21 tablet 0  . sacubitril-valsartan (ENTRESTO) 24-26 MG Take 1 tablet by mouth 2 (two) times daily.       No results found for this or any previous visit (from the past 48 hour(s)). No results found.  Review of Systems  All other systems reviewed and are negative.  Last menstrual period 11/09/2012. Physical Exam  Patient is alert, oriented, no adenopathy, well-dressed, normal affect, normal respiratory effort. Examination patient is able to hold a straight leg raise without an extensor lag.  Her patella is more prominent but midline.  Review of the radiographs shows an anterior dislocation of the femoral on the tibial component consistent with the femoral component jumping over the post this may be due to collateral ligamentous laxity or laxity  of the extensor mechanism.  Patient is able to do a straight leg raise.  There is no redness no cellulitis no effusion no signs of infection.  Patient's foot has good capillary refill.  No clinical signs of popliteal injury.  Assessment/Plan . Status post revision of total replacement of right knee   2. Failed total knee replacement, sequela     Plan: Discussed with the patient she has a dislocation of the total knee arthroplasty.  This may be due to collateral ligament laxity laterally from her chronically unstable total knee arthroplasty or may be due to the femoral component jumping over the polyethylene post.  Discussed with we may be able to resolve the problem with a thicker polyethylene tray or may require a revision to a hinged total knee arthroplasty.  Discussed that with the hinged total knee arthroplasty we would need to change out the femoral component we would be able to reuse the tibial component.  Discussed the patient does have increased risks with surgery.  She states she understands and wishes to proceed at this time.    Newt Minion, MD 05/14/2018, 7:23 AM

## 2018-05-15 NOTE — Progress Notes (Signed)
Physical Therapy Treatment Patient Details Name: [name] MRN: [id number] DOB: 23-Jun-1966 Today's Date: 05/15/2018    History of Present Illness Pt is a 52 y/o female who is s/p R total knee revision to hinged knee, secondary to R TKA dislocation. PMH includes CHF, cardiomyopathy, HTN, sleep apnea, obesity, DM, and R total knee replacement.     PT Comments    Pt making good progress with functional mobility. She is now overall requiring min A for mobility including bed mobility and transfers with RW. R KI remained on throughout session. Has not attempted ambulation yet. Pt would continue to benefit from skilled physical therapy services at this time while admitted and after d/c to address the below listed limitations in order to improve overall safety and independence with functional mobility.    Follow Up Recommendations  Home health PT;Supervision/Assistance - 24 hour     Equipment Recommendations  3in1 (PT);Wheelchair (measurements PT);Wheelchair cushion (measurements PT);Other (comment)(bariatric)    Recommendations for Other Services       Precautions / Restrictions Precautions Precautions: Knee;Other (comment) Precaution Booklet Issued: No Precaution Comments: Assumed no ROM in R knee secondary to KI at all times order. Need clarification from MD. Required Braces or Orthoses: Knee Immobilizer - Right Knee Immobilizer - Right: On at all times Restrictions Weight Bearing Restrictions: Yes RLE Weight Bearing: Weight bearing as tolerated    Mobility  Bed Mobility Overal bed mobility: Needs Assistance Bed Mobility: Supine to Sit     Supine to sit: Min assist     General bed mobility comments: min A for R LE movement off of bed with KI donned throughout  Transfers Overall transfer level: Needs assistance Equipment used: Rolling walker (2 wheeled) Transfers: Sit to/from [facility] Sit to Stand: Min assist;+2 safety/equipment Stand pivot  transfers: Min assist;+2 safety/equipment       General transfer comment: bed in elevated position,   Ambulation/Gait                 Stairs             Wheelchair Mobility    Modified Rankin (Stroke Patients Only)       Balance Overall balance assessment: Needs assistance Sitting-balance support: Feet supported Sitting balance-Leahy Scale: Fair     Standing balance support: Bilateral upper extremity supported;During functional activity Standing balance-Leahy Scale: Poor                              Cognition Arousal/Alertness: Awake/alert Behavior During Therapy: WFL for tasks assessed/performed Overall Cognitive Status: Within Functional Limits for tasks assessed                                        Exercises      General Comments        Pertinent Vitals/Pain Pain Assessment: Faces Faces Pain Scale: Hurts whole lot Pain Location: R knee  Pain Descriptors / Indicators: Aching;Operative site guarding Pain Intervention(s): Monitored during session;Repositioned    Home Living                      Prior Function            PT Goals (current goals can now be found in the care plan section) Acute Rehab PT Goals PT Goal Formulation: With patient Time For Goal  Achievement: 05/28/18 Potential to Achieve Goals: Fair Progress towards PT goals: Progressing toward goals    Frequency    7X/week      PT Plan Current plan remains appropriate    Co-evaluation              AM-PAC PT "6 Clicks" Daily Activity  Outcome Measure  Difficulty turning over in bed (including adjusting bedclothes, sheets and blankets)?: Unable Difficulty moving from lying on back to sitting on the side of the bed? : Unable Difficulty sitting down on and standing up from a chair with arms (e.g., wheelchair, bedside commode, etc,.)?: Unable Help needed moving to and from a bed to chair (including a wheelchair)?: A  Little Help needed walking in hospital room?: A Little Help needed climbing 3-5 steps with a railing? : Total 6 Click Score: 10    End of Session Equipment Utilized During Treatment: Gait belt;Right knee immobilizer Activity Tolerance: Patient limited by fatigue Patient left: in chair;with call bell/phone within reach;with family/visitor present Nurse Communication: Mobility status PT Visit Diagnosis: Unsteadiness on feet (R26.81);Difficulty in walking, not elsewhere classified (R26.2);Muscle weakness (generalized) (M62.81);Pain Pain - Right/Left: Right Pain - part of body: Knee     Time: 1200-1230 PT Time Calculation (min) (ACUTE ONLY): 30 min  Charges:  $Therapeutic Activity: 23-37 mins                     Sherie Don, PT, DPT  Acute Rehabilitation Services Pager [phone] Office [place] 05/15/2018, 1:38 PM

## 2018-05-15 NOTE — Progress Notes (Signed)
     Subjective: 1 Day Post-Op Procedure(s) (LRB): RIGHT TOTAL KNEE REVISION TO HINGED KNEE (Right)Right knee in knee immoblilizer. Smoker, 1/2 ppd, started nicotine patch 14mg /day. Awake, alert and oriented x 4. Notes that she is not diabetic and requests change in diet. Does exhibit glucose intolerance on todays BMET gluc 185. Should remain on low carb diet.   Patient reports pain as moderate.    Objective:   VITALS:  Temp:  [97.2 F (36.2 C)-99.1 F (37.3 C)] 97.9 F (36.6 C) (08/31 0644) Pulse Rate:  [72-94] 86 (08/31 0644) Resp:  [12-19] 18 (08/30 1344) BP: (77-133)/(49-84) 121/78 (08/31 0644) SpO2:  [94 %-100 %] 97 % (08/31 0644)  Neurologically intact ABD soft Neurovascular intact Sensation intact distally Intact pulses distally Dorsiflexion/Plantar flexion intact Incision: moderate drainage and VAC in place   LABS Recent Labs    05/14/18 0810  HGB 12.8  WBC 7.5  PLT 240   Recent Labs    05/14/18 0810  NA 138  K 3.5  CL 105  CO2 25  BUN 13  CREATININE 0.91  GLUCOSE 185*   No results for input(s): LABPT, INR in the last 72 hours.   Assessment/Plan: 1 Day Post-Op Procedure(s) (LRB): RIGHT TOTAL KNEE REVISION TO HINGED KNEE (Right)  Advance diet Up with therapy  VAC wound care for the right anterior knee post reconstruction of ligaments and revision to a more Constrained TKR.  Basil Dess 05/15/2018, 9:47 AMPatient ID: [name], female   DOB: 13-Jul-1966, 52 y.o.   MRN: [id number]

## 2018-05-15 NOTE — Progress Notes (Signed)
Physical Therapy Treatment Patient Details Name: [name] MRN: [id number] DOB: 01-27-1966 Today's Date: 05/15/2018    History of Present Illness Pt is a 52 y/o female who is s/p R total knee revision to hinged knee, secondary to R TKA dislocation. PMH includes CHF, cardiomyopathy, HTN, sleep apnea, obesity, DM, and R total knee replacement.     PT Comments    Pt seen for second session with OT. Focus of session was on transfers. Pt continues to require increased time and effort with a very particular technique; however, very safe and steady throughout. Pt would continue to benefit from skilled physical therapy services at this time while admitted and after d/c to address the below listed limitations in order to improve overall safety and independence with functional mobility.   Follow Up Recommendations  Home health PT;Supervision/Assistance - 24 hour     Equipment Recommendations  3in1 (PT);Wheelchair (measurements PT);Wheelchair cushion (measurements PT);Other (comment)(bariatric)    Recommendations for Other Services       Precautions / Restrictions Precautions Precautions: Knee;Other (comment) Precaution Booklet Issued: No Precaution Comments: Assumed no ROM in R knee secondary to KI at all times order. Need clarification from MD. Wound VAC Required Braces or Orthoses: Knee Immobilizer - Right Knee Immobilizer - Right: On at all times Restrictions Weight Bearing Restrictions: Yes RLE Weight Bearing: Weight bearing as tolerated    Mobility  Bed Mobility Overal bed mobility: Needs Assistance Bed Mobility: Sit to Supine     Supine to sit: Min assist Sit to supine: Min assist;+2 for physical assistance   General bed mobility comments: increased time and effort min A x2 to assist bilateral LEs back onto bed  Transfers Overall transfer level: Needs assistance Equipment used: Rolling walker (2 wheeled) Transfers: Sit to/from [facility] Sit to  Stand: Min assist;+2 safety/equipment Stand pivot transfers: Min assist;+2 safety/equipment       General transfer comment: increased time and effort, pt very particular about her technique, min A x2 for safety and stability  Ambulation/Gait             General Gait Details: unable to tolerate secondary to pain and fatigue   Stairs             Wheelchair Mobility    Modified Rankin (Stroke Patients Only)       Balance Overall balance assessment: Needs assistance Sitting-balance support: Feet supported Sitting balance-Leahy Scale: Good     Standing balance support: Single extremity supported;Bilateral upper extremity supported;During functional activity Standing balance-Leahy Scale: Poor                              Cognition Arousal/Alertness: Awake/alert Behavior During Therapy: WFL for tasks assessed/performed Overall Cognitive Status: Within Functional Limits for tasks assessed                                        Exercises      General Comments        Pertinent Vitals/Pain Pain Assessment: Faces Faces Pain Scale: Hurts even more Pain Location: R knee  Pain Descriptors / Indicators: Aching;Operative site guarding Pain Intervention(s): Monitored during session;Repositioned    Home Living Family/patient expects to be discharged to:: Private residence Living Arrangements: Children Available Help at Discharge: Family;Personal care attendant Type of Home: House       Home Equipment:  Walker - 2 wheels;Bedside commode      Prior Function Level of Independence: Needs assistance  Gait / Transfers Assistance Needed: RW for mobility inside the home and then uses electric chair for outside of home.  ADL's / Homemaking Assistance Needed: Requires assist for bathing and ADL tasks; has a CNA who assists 5 hrs/day     PT Goals (current goals can now be found in the care plan section) Acute Rehab PT Goals PT Goal  Formulation: With patient Time For Goal Achievement: 05/28/18 Potential to Achieve Goals: Fair Progress towards PT goals: Progressing toward goals    Frequency    7X/week      PT Plan Current plan remains appropriate    Co-evaluation PT/OT/SLP Co-Evaluation/Treatment: Yes Reason for Co-Treatment: For patient/therapist safety;To address functional/ADL transfers          AM-PAC PT "6 Clicks" Daily Activity  Outcome Measure  Difficulty turning over in bed (including adjusting bedclothes, sheets and blankets)?: Unable Difficulty moving from lying on back to sitting on the side of the bed? : Unable Difficulty sitting down on and standing up from a chair with arms (e.g., wheelchair, bedside commode, etc,.)?: Unable Help needed moving to and from a bed to chair (including a wheelchair)?: A Little Help needed walking in hospital room?: A Little Help needed climbing 3-5 steps with a railing? : Total 6 Click Score: 10    End of Session Equipment Utilized During Treatment: Gait belt;Right knee immobilizer Activity Tolerance: Patient limited by fatigue;Patient limited by pain Patient left: in bed;with call bell/phone within reach;with family/visitor present Nurse Communication: Mobility status PT Visit Diagnosis: Pain;Other abnormalities of gait and mobility (R26.89) Pain - Right/Left: Right Pain - part of body: Knee     Time: 1415-1431 PT Time Calculation (min) (ACUTE ONLY): 16 min  Charges:  $Therapeutic Activity: 8-22 mins                     Sherie Don, PT, DPT  Acute Rehabilitation Services Pager [phone] Office [place] 05/15/2018, 3:46 PM

## 2018-05-15 NOTE — Plan of Care (Signed)
  Problem: Pain Managment: Goal: General experience of comfort will improve Outcome: Progressing   

## 2018-05-15 NOTE — Evaluation (Signed)
Occupational Therapy Evaluation Patient Details Name: [name] MRN: [id number] DOB: 1966-06-24 Today's Date: 05/15/2018    History of Present Illness Pt is a 52 y/o female who is s/p R total knee revision to hinged knee, secondary to R TKA dislocation. PMH includes CHF, cardiomyopathy, HTN, sleep apnea, obesity, DM, and R total knee replacement.    Clinical Impression   PTA, pt was living with her daughter and required assistance for LB ADLs from aide. Pt currently requiring Min A for UB ADLs, Max A for LB ADLs, and Min A +2 for functional transfers with RW. Pt presenting with decreased strength, balance, and activity tolerance and requiring increased time throughout session. Pt would benefit from further acute OT to facilitate safe dc. Recommend dc to home with HHOT for further OT to optimize safety, independence with ADLs, and return to PLOF.      Follow Up Recommendations  Home health OT;Supervision/Assistance - 24 hour(HHaide)    Equipment Recommendations  3 in 1 bedside commode;Wheelchair (measurements OT);Wheelchair cushion (measurements OT)([place])    Recommendations for Other Services PT consult     Precautions / Restrictions Precautions Precautions: Knee;Other (comment) Precaution Booklet Issued: No Precaution Comments: Assumed no ROM in R knee secondary to KI at all times order. Need clarification from MD. Wound VAC Required Braces or Orthoses: Knee Immobilizer - Right Knee Immobilizer - Right: On at all times Restrictions Weight Bearing Restrictions: Yes RLE Weight Bearing: Weight bearing as tolerated Other Position/Activity Restrictions: WBAT with KI on       Mobility Bed Mobility Overal bed mobility: Needs Assistance Bed Mobility: Sit to Supine     Supine to sit: Min assist Sit to supine: Min assist;+2 for physical assistance   General bed mobility comments: increased time and effort min A x2 to assist bilateral LEs back onto bed  Transfers Overall  transfer level: Needs assistance Equipment used: Rolling walker (2 wheeled) Transfers: Sit to/from [facility] Sit to Stand: Min assist;+2 safety/equipment Stand pivot transfers: Min assist;+2 safety/equipment       General transfer comment: increased time and effort, pt very particular about her technique, min A x2 for safety and stability    Balance Overall balance assessment: Needs assistance Sitting-balance support: Feet supported Sitting balance-Leahy Scale: Good     Standing balance support: Single extremity supported;Bilateral upper extremity supported;During functional activity Standing balance-Leahy Scale: Poor                             ADL either performed or assessed with clinical judgement   ADL Overall ADL's : Needs assistance/impaired Eating/Feeding: Set up;Sitting   Grooming: Set up;Sitting   Upper Body Bathing: Minimal assistance;Sitting   Lower Body Bathing: Maximal assistance;+2 for safety/equipment;Sit to/from stand   Upper Body Dressing : Minimal assistance;Sitting   Lower Body Dressing: Maximal assistance;+2 for safety/equipment;Sit to/from stand   Toilet Transfer: Minimal assistance;+2 for safety/equipment;Stand-pivot;RW(Simulated in room)           Functional mobility during ADLs: Minimal assistance;+2 for safety/equipment(stand pivot only) General ADL Comments: Pt with decreased ROM, balance, and strength. Pt requiring significant amount of time     Vision         Perception     Praxis      Pertinent Vitals/Pain Pain Assessment: Faces Faces Pain Scale: Hurts even more Pain Location: R knee  Pain Descriptors / Indicators: Aching;Operative site guarding Pain Intervention(s): Monitored during session;Limited activity within patient's tolerance;Repositioned  Hand Dominance     Extremity/Trunk Assessment Upper Extremity Assessment Upper Extremity Assessment: Overall WFL for tasks assessed   Lower  Extremity Assessment Lower Extremity Assessment: Defer to PT evaluation RLE Deficits / Details: Deficits consistent with post op pain and weakness.    Cervical / Trunk Assessment Cervical / Trunk Assessment: Other exceptions Cervical / Trunk Exceptions: Increased body habitus   Communication Communication Communication: No difficulties   Cognition Arousal/Alertness: Awake/alert Behavior During Therapy: WFL for tasks assessed/performed Overall Cognitive Status: Within Functional Limits for tasks assessed                                     General Comments  Pt's daughter present throughout session    Exercises     Shoulder Instructions      Home Living Family/patient expects to be discharged to:: Private residence Living Arrangements: Children Available Help at Discharge: Family;Personal care attendant Type of Home: [place]: One level     Bathroom Shower/Tub: Teacher, early years/pre: [place]: Environmental consultant - 2 wheels;Bedside commode   Additional Comments: Requesting to get new 3 in 1 as hers is broken      Prior Functioning/Environment Level of Independence: Needs assistance  Gait / Transfers Assistance Needed: RW for mobility inside the home and then uses electric chair for outside of home.  ADL's / Homemaking Assistance Needed: Requires assist for bathing and ADL tasks; has a CNA who assists 5 hrs/day            OT Problem List: Decreased strength;Decreased range of motion;Decreased activity tolerance;Impaired balance (sitting and/or standing);Decreased knowledge of use of DME or AE;Decreased knowledge of precautions;Obesity;Pain      OT Treatment/Interventions: Self-care/ADL training;Therapeutic exercise;Energy conservation;DME and/or AE instruction;Therapeutic activities;Patient/family education    OT Goals(Current goals can be found in the care plan section) Acute Rehab OT  Goals Patient Stated Goal: "for my knee to heal"  OT Goal Formulation: With patient Time For Goal Achievement: 05/29/18 Potential to Achieve Goals: Good ADL Goals Pt Will Perform Upper Body Dressing: with set-up;with supervision;sitting Pt Will Perform Lower Body Dressing: with set-up;with supervision;with adaptive equipment;sit to/from stand Pt Will Transfer to Toilet: with set-up;with supervision;bedside commode;stand pivot transfer Pt Will Perform Toileting - Clothing Manipulation and hygiene: with set-up;with supervision;sit to/from stand;with adaptive equipment;sitting/lateral leans Additional ADL Goal #1: Pt will perform bed mobility with supervision in preparation for ADLs  OT Frequency: Min 2X/week   Barriers to D/C:            Co-evaluation PT/OT/SLP Co-Evaluation/Treatment: Yes Reason for Co-Treatment: For patient/therapist safety;To address functional/ADL transfers   OT goals addressed during session: ADL's and self-care      AM-PAC PT "6 Clicks" Daily Activity     Outcome Measure Help from another person eating meals?: None Help from another person taking care of personal grooming?: A Little Help from another person toileting, which includes using toliet, bedpan, or urinal?: A Lot Help from another person bathing (including washing, rinsing, drying)?: A Lot Help from another person to put on and taking off regular upper body clothing?: A Little Help from another person to put on and taking off regular lower body clothing?: A Lot 6 Click Score: 16   End of Session Equipment Utilized During Treatment: Gait belt;Rolling walker Nurse Communication: Mobility status  Activity Tolerance: Patient  tolerated treatment well Patient left: in bed;with call bell/phone within reach;with family/visitor present  OT Visit Diagnosis: Unsteadiness on feet (R26.81);Other abnormalities of gait and mobility (R26.89);Muscle weakness (generalized) (M62.81);Pain Pain - Right/Left:  Right Pain - part of body: Knee                Time: 0104-0459 OT Time Calculation (min): 16 min Charges:  OT General Charges $OT Visit: 1 Visit OT Evaluation $OT Eval Moderate Complexity: Milo, OTR/L Acute Rehab Pager: [phone] Office: [place] 05/15/2018, 4:30 PM

## 2018-05-16 ENCOUNTER — Encounter (HOSPITAL_COMMUNITY): Payer: Self-pay

## 2018-05-16 NOTE — Progress Notes (Signed)
     Subjective: 2 Days Post-Op Procedure(s) (LRB): RIGHT TOTAL KNEE REVISION TO HINGED KNEE (Right) Awake, alert and oriented x 4. VAC right anterior knee intact and functioning well.  Patient reports pain as moderate. Feels much better than prior to surgery.     Objective:   VITALS:  Temp:  [98.3 F (36.8 C)-98.4 F (36.9 C)] 98.4 F (36.9 C) (09/01 0528) Pulse Rate:  [71-84] 71 (09/01 0528) Resp:  [15] 15 (08/31 1456) BP: (104-128)/(65-69) 128/68 (09/01 0528) SpO2:  [91 %-98 %] 98 % (09/01 0528) Weight:  [152 kg-153.3 kg] 153.3 kg (09/01 0900)  Neurologically intact ABD soft Neurovascular intact Sensation intact distally Intact pulses distally Dorsiflexion/Plantar flexion intact Incision: dressing C/D/I, scant drainage and VAC functioning well.   LABS Recent Labs    05/14/18 0810  HGB 12.8  WBC 7.5  PLT 240   Recent Labs    05/14/18 0810  NA 138  K 3.5  CL 105  CO2 25  BUN 13  CREATININE 0.91  GLUCOSE 185*   No results for input(s): LABPT, INR in the last 72 hours.   Assessment/Plan: 2 Days Post-Op Procedure(s) (LRB): RIGHT TOTAL KNEE REVISION TO HINGED KNEE (Right)  Advance diet Up with therapy  VAC per Dr. Sharol Given.    Basil Dess 05/16/2018, 12:50 PMPatient ID: [name], female   DOB: [birth year], 52 y.o.   MRN: [id number]

## 2018-05-16 NOTE — Plan of Care (Signed)
  Problem: Pain Managment: Goal: General experience of comfort will improve Outcome: Progressing   

## 2018-05-16 NOTE — Progress Notes (Signed)
Physical Therapy Treatment Patient Details Name: [name] MRN: [id number] DOB: Jun 10, 1966 Today's Date: 05/16/2018    History of Present Illness Pt is a 52 y/o female who is s/p R total knee revision to hinged knee, secondary to R TKA dislocation. PMH includes CHF, cardiomyopathy, HTN, sleep apnea, obesity, DM, and R total knee replacement.     PT Comments    Pt had significantly more difficulty getting from the lower recliner chair back to the bed.  We were able to get her to stand and turn with the RW from the recliner with two person mod assist, but from there, she was unable to take steps backward to get to sitting on the bed.  The bed had to be moved up behind her to sit.  I reinforce, that I do not believe she will do well at home and continue to strongly recommend SNF level rehab at discharge.     Follow Up Recommendations  SNF;Supervision for mobility/OOB     Equipment Recommendations  3in1 (PT);Wheelchair (measurements PT);Wheelchair cushion (measurements PT);Other (comment)(bariatric WC and BSC, elevating leg rest for WC.)    Recommendations for Other Services OT consult     Precautions / Restrictions Precautions Precautions: Knee;Other (comment) Precaution Comments: Assumed no ROM in R knee secondary to KI at all times order. Need clarification from MD. Wound VAC Required Braces or Orthoses: Knee Immobilizer - Right Knee Immobilizer - Right: On at all times Restrictions RLE Weight Bearing: Weight bearing as tolerated Other Position/Activity Restrictions: WBAT with KI on     Mobility  Bed Mobility Overal bed mobility: Needs Assistance Bed Mobility: Sit to Supine     Supine to sit: Min assist;HOB elevated Sit to supine: Mod assist;+2 for physical assistance   General bed mobility comments: Mod assist to help pt lift both legs back into the bed, second person assisting her trunk from behind.  Bed positioned in trendelenburg for pt to scoot up.    Transfers Overall transfer level: Needs assistance Equipment used: Rolling walker (2 wheeled) Transfers: Sit to/from [facility] Sit to Stand: +2 physical assistance;Mod assist;From elevated surface Stand pivot transfers: +2 physical assistance;Min assist;From elevated surface       General transfer comment: Two person mod assist to stand from lower (lower than bed was earlier, but still elevated with pillows/blankets) recliner chair.  Pt needed phsyical assist to power up, stabilize RW and block feet from sliding.  Pt was able to take a few pivotal steps around, but unable to step back to the bed, so bed had to be unlocked and moved behind the pt to successfully make it back to bed.    Ambulation/Gait             General Gait Details: Unable at this time.        Balance Overall balance assessment: Needs assistance Sitting-balance support: Feet supported;No upper extremity supported Sitting balance-Leahy Scale: Good     Standing balance support: Bilateral upper extremity supported Standing balance-Leahy Scale: Poor Standing balance comment: reliant on RW and therapist for support.                             Cognition Arousal/Alertness: Awake/alert Behavior During Therapy: WFL for tasks assessed/performed Overall Cognitive Status: Within Functional Limits for tasks assessed  Exercises Total Joint Exercises Ankle Circles/Pumps: AROM;Both;20 reps Quad Sets: AROM;Right;10 reps Towel Squeeze: AROM;Both;10 reps Hip ABduction/ADduction: AAROM;Right;10 reps Straight Leg Raises: AAROM;Right;10 reps    General Comments General comments (skin integrity, edema, etc.): I asked pt her plan for post acute and she says she has a ramp, but is planning on ambulance transport home, increasing her aide hours and having home therapy.  I am not sure this is the safest plan for her, but she seems adamant  about not going to SNF.       Pertinent Vitals/Pain Pain Assessment: Faces Faces Pain Scale: Hurts whole lot Pain Location: R knee  Pain Descriptors / Indicators: Grimacing;Guarding Pain Intervention(s): Limited activity within patient's tolerance;Monitored during session;Repositioned           PT Goals (current goals can now be found in the care plan section) Acute Rehab PT Goals Patient Stated Goal: "for my knee to heal"  Progress towards PT goals: Progressing toward goals    Frequency    7X/week      PT Plan Discharge plan needs to be updated       AM-PAC PT "6 Clicks" Daily Activity  Outcome Measure  Difficulty turning over in bed (including adjusting bedclothes, sheets and blankets)?: Unable Difficulty moving from lying on back to sitting on the side of the bed? : Unable Difficulty sitting down on and standing up from a chair with arms (e.g., wheelchair, bedside commode, etc,.)?: Unable Help needed moving to and from a bed to chair (including a wheelchair)?: A Little Help needed walking in hospital room?: Total Help needed climbing 3-5 steps with a railing? : Total 6 Click Score: 8    End of Session Equipment Utilized During Treatment: Gait belt;Right knee immobilizer Activity Tolerance: Patient limited by pain Patient left: with call bell/phone within reach;in bed;with nursing/sitter in room Nurse Communication: Mobility status PT Visit Diagnosis: Muscle weakness (generalized) (M62.81);Difficulty in walking, not elsewhere classified (R26.2);Pain Pain - Right/Left: Right Pain - part of body: Knee     Time: 2671-2458 PT Time Calculation (min) (ACUTE ONLY): 17 min  Charges:  $Therapeutic Exercise: 8-22 mins $Therapeutic Activity: 8-22 mins          [name] B. [place], [place], DPT [phone]            05/16/2018, 4:41 PM

## 2018-05-16 NOTE — Progress Notes (Signed)
Physical Therapy Treatment Patient Details Name: [name] MRN: [id number] DOB: March 20, 1966 Today's Date: 05/16/2018    History of Present Illness Pt is a 52 y/o female who is s/p R total knee revision to hinged knee, secondary to R TKA dislocation. PMH includes CHF, cardiomyopathy, HTN, sleep apnea, obesity, DM, and R total knee replacement.     PT Comments    Pt is very slow to progress (has only gotten OOB to recliner chair so far) and is unable to take steps to progress gait.  She is most appropriate for SNF placement for prolonged post acute rehab, but when I discussed this with her today she is adamant she is going home. She reports she will need ambulance transport, to increase her aide hours and for home therapy.   PT will continue to follow acutely to progress safe mobility.    Follow Up Recommendations  SNF;Supervision for mobility/OOB     Equipment Recommendations  3in1 (PT);Wheelchair (measurements PT);Wheelchair cushion (measurements PT);Other (comment)(bariatric WC and BSC, elevating leg rest for WC.)    Recommendations for Other Services OT consult     Precautions / Restrictions Precautions Precautions: Knee;Other (comment) Precaution Comments: Assumed no ROM in R knee secondary to KI at all times order. Need clarification from MD. Wound VAC Required Braces or Orthoses: Knee Immobilizer - Right Knee Immobilizer - Right: On at all times Restrictions Weight Bearing Restrictions: Yes RLE Weight Bearing: Weight bearing as tolerated Other Position/Activity Restrictions: WBAT with KI on     Mobility  Bed Mobility Overal bed mobility: Needs Assistance Bed Mobility: Supine to Sit     Supine to sit: Min assist;HOB elevated     General bed mobility comments: Min assist with HOB elevated and heavy use of rails.  Pt needed assist to move right leg to EOB.   Transfers Overall transfer level: Needs assistance Equipment used: Rolling walker (2 wheeled) Transfers:  Sit to/from [facility] Sit to Stand: +2 physical assistance;Min assist;From elevated surface Stand pivot transfers: +2 physical assistance;Min assist       General transfer comment: Two person min assist to stand from maximally elevated bed to RW.  Pt using both hands on RW to pull up to stand (not realistic to do by herself).  Assist needed to stabilize pt and help maneuver RW during pivot transfer to elevated chair.  Assist needed to hold R leg during transition to sit down.  Pt not taking pivotal steps at all, just pivoting around on both feet to get to the chair.   Ambulation/Gait             General Gait Details: Unable at this time.           Balance Overall balance assessment: Needs assistance Sitting-balance support: Feet supported;No upper extremity supported Sitting balance-Leahy Scale: Good     Standing balance support: Bilateral upper extremity supported Standing balance-Leahy Scale: Poor Standing balance comment: reliant on RW for support.                             Cognition Arousal/Alertness: Awake/alert Behavior During Therapy: WFL for tasks assessed/performed Overall Cognitive Status: Within Functional Limits for tasks assessed                                        Exercises Total Joint Exercises Ankle Circles/Pumps: AROM;Both;20  reps Quad Sets: AROM;Right;10 reps Towel Squeeze: AROM;Both;10 reps Hip ABduction/ADduction: AAROM;Right;10 reps Straight Leg Raises: AAROM;Right;10 reps    General Comments General comments (skin integrity, edema, etc.): I asked pt her plan for post acute and she says she has a ramp, but is planning on ambulance transport home, increasing her aide hours and having home therapy.  I am not sure this is the safest plan for her, but she seems adamant about not going to SNF.       Pertinent Vitals/Pain Pain Assessment: Faces Faces Pain Scale: Hurts whole lot Pain Location: R  knee  Pain Descriptors / Indicators: Grimacing;Guarding Pain Intervention(s): Limited activity within patient's tolerance;Monitored during session;Repositioned           PT Goals (current goals can now be found in the care plan section) Acute Rehab PT Goals Patient Stated Goal: "for my knee to heal"  Progress towards PT goals: Progressing toward goals    Frequency    7X/week      PT Plan Discharge plan needs to be updated       AM-PAC PT "6 Clicks" Daily Activity  Outcome Measure  Difficulty turning over in bed (including adjusting bedclothes, sheets and blankets)?: Unable Difficulty moving from lying on back to sitting on the side of the bed? : Unable Difficulty sitting down on and standing up from a chair with arms (e.g., wheelchair, bedside commode, etc,.)?: Unable Help needed moving to and from a bed to chair (including a wheelchair)?: A Little Help needed walking in hospital room?: Total Help needed climbing 3-5 steps with a railing? : Total 6 Click Score: 8    End of Session Equipment Utilized During Treatment: Gait belt;Right knee immobilizer Activity Tolerance: Patient limited by pain Patient left: in chair;with call bell/phone within reach Nurse Communication: Mobility status PT Visit Diagnosis: Muscle weakness (generalized) (M62.81);Difficulty in walking, not elsewhere classified (R26.2);Pain Pain - Right/Left: Right Pain - part of body: Knee     Time: 1400-1431 PT Time Calculation (min) (ACUTE ONLY): 31 min  Charges:  $Therapeutic Exercise: 8-22 mins $Therapeutic Activity: 8-22 mins                    Shiana Rappleye B. [place], [place], DPT [phone]   05/16/2018, 2:39 PM

## 2018-05-17 MED ORDER — ASPIRIN 325 MG PO TBEC: 325.0000 mg | DELAYED_RELEASE_TABLET | Freq: Every day | ORAL | 0 refills | Status: DC

## 2018-05-17 MED ORDER — OXYCODONE-ACETAMINOPHEN 10-325 MG PO TABS: 1.0000 | ORAL_TABLET | ORAL | 0 refills | Status: DC | PRN

## 2018-05-17 NOTE — Progress Notes (Signed)
Physical Therapy Treatment Patient Details Name: [name] MRN: [id number] DOB: [birth year] Today's Date: 05/17/2018    History of Present Illness Pt is a 52 y/o female who is s/p R total knee revision to hinged knee, secondary to R TKA dislocation. PMH includes CHF, cardiomyopathy, HTN, sleep apnea, obesity, DM, and R total knee replacement.     PT Comments    Pt sitting in recliner on arrival.  Pt reports she is not interested in going to rehab because she does not want to wake up one morning and be uncertain of where she is staying.  Pt presents with word finding difficulty which she reports is baseline after her spinal surgery.  Pt agreeable to PT tx and able to advance to 4 ft of gt training before reports she needed to use the toilet.  Pt continues to require a WC at home as she is limited in her mobility and will need the chair to function.  Continue to recommend SNF based on physical limitations, patient continues to refuse and will d/c home today per MD.     Follow Up Recommendations  SNF;Supervision for mobility/OOB(Pt is declining SNF so HHPT will be required at d/c.  )     Equipment Recommendations  3in1 (PT);Wheelchair (measurements PT);Wheelchair cushion (measurements PT);Other (comment)(Bariatric WC with elevating leg rests and Bariatric BSC)    Recommendations for Other Services       Precautions / Restrictions Precautions Precautions: Knee;Other (comment) Precaution Booklet Issued: No Precaution Comments: DR. DUDA reports patient can remove KI for ROM but must be on during weight bearing activities.   Required Braces or Orthoses: Knee Immobilizer - Right Knee Immobilizer - Right: On when out of bed or walking Restrictions Weight Bearing Restrictions: Yes RLE Weight Bearing: Weight bearing as tolerated Other Position/Activity Restrictions: WBAT with KI on     Mobility  Bed Mobility Overal bed mobility: Needs Assistance Bed Mobility: Sit to Supine      Supine to sit: HOB elevated;Min assist     General bed mobility comments: Pt sitting in recliner chair on arrival.    Transfers Overall transfer level: Needs assistance Equipment used: Rolling walker (2 wheeled) Transfers: Sit to/from [facility] Sit to Stand: Min assist Stand pivot transfers: Min assist;From elevated surface       General transfer comment: Pt able to stand with cues for hand and foot placement and slow movement to transition to standing.  She is slow and guarded with movements and reports she is fearful of falling in standing.  Once in standing she is able to correct her posture and stand upright for greater than 1 min.    Ambulation/Gait Ambulation/Gait assistance: Min guard;+2 safety/equipment Gait Distance (Feet): 4 Feet Assistive device: Rolling walker (2 wheeled) Gait Pattern/deviations: Step-to pattern;Wide base of support;Antalgic;Decreased stride length     General Gait Details: Pt with wide waddling BOS with cues for increaside stride length.  L step length is shorter and more shuffled in observation.  Her LLE also appears ER.  Pt fatigues quickly with ambulation and requires chair to sit, she was also limited due to need to have BM.  Bed pad placed under patient in chair as commode in room was too small.     Stairs             Wheelchair Mobility    Modified Rankin (Stroke Patients Only)       Balance Overall balance assessment: Needs assistance Sitting-balance support: Feet supported;No upper extremity supported  Sitting balance-Leahy Scale: Good     Standing balance support: Bilateral upper extremity supported Standing balance-Leahy Scale: Poor Standing balance comment: Relies on RW and external assistance                            Cognition Arousal/Alertness: Awake/alert Behavior During Therapy: WFL for tasks assessed/performed Overall Cognitive Status: Difficult to assess                                         Exercises      General Comments General comments (skin integrity, edema, etc.): Pt adamant about discharging home rather than to SNF as she reports she had to make "bids" and does not want to deal with insurance. She has a highly unstable discharge plan and is not able to report how much assistance is truly available to her at home.       Pertinent Vitals/Pain Pain Assessment: 0-10 Pain Score: 6  Pain Location: R knee  Pain Descriptors / Indicators: Grimacing;Guarding Pain Intervention(s): Monitored during session;Repositioned    Home Living                      Prior Function            PT Goals (current goals can now be found in the care plan section) Acute Rehab PT Goals Patient Stated Goal: "to go home and not to rehab" Potential to Achieve Goals: Fair Progress towards PT goals: Progressing toward goals    Frequency    7X/week      PT Plan Discharge plan needs to be updated;Current plan remains appropriate    Co-evaluation              AM-PAC PT "6 Clicks" Daily Activity  Outcome Measure  Difficulty turning over in bed (including adjusting bedclothes, sheets and blankets)?: Unable Difficulty moving from lying on back to sitting on the side of the bed? : Unable Difficulty sitting down on and standing up from a chair with arms (e.g., wheelchair, bedside commode, etc,.)?: Unable Help needed moving to and from a bed to chair (including a wheelchair)?: A Little Help needed walking in hospital room?: A Lot Help needed climbing 3-5 steps with a railing? : Total 6 Click Score: 9    End of Session Equipment Utilized During Treatment: Gait belt;Right knee immobilizer Activity Tolerance: Patient limited by pain Patient left: with call bell/phone within reach;in bed;with nursing/sitter in room Nurse Communication: Mobility status PT Visit Diagnosis: Muscle weakness (generalized) (M62.81);Difficulty in walking, not elsewhere  classified (R26.2);Pain Pain - Right/Left: Right Pain - part of body: Knee     Time: 1243-1310 PT Time Calculation (min) (ACUTE ONLY): 27 min  Charges:  $Gait Training: 8-22 mins $Therapeutic Activity: 8-22 mins                    Governor Rooks, PTA pager [phone]    Cristela Blue 05/17/2018, 2:35 PM

## 2018-05-17 NOTE — Plan of Care (Signed)
  Problem: Pain Managment: Goal: General experience of comfort will improve Outcome: Progressing   

## 2018-05-17 NOTE — Progress Notes (Signed)
Patients blood pressure reading is 102/52. Patient is sitting up in bed and was encouraged to continue drinking fluids. Currently resting in bed. Will continue to monitor. Verdia Kuba, RN 05/17/2018 4:38 PM

## 2018-05-17 NOTE — Clinical Social Work Note (Signed)
Per MD note pt will d/c home today. Clinical Social Worker will sign off for now as social work intervention is no longer needed. Please consult [place] again if new need arises.   Shelton Silvas A Corneluis Allston 05/17/2018

## 2018-05-17 NOTE — Discharge Summary (Addendum)
Discharge Diagnoses:  Active Problems:   Failed total knee arthroplasty, sequela   Dislocated knee, right, sequela   S/P revision of total knee, right   Surgeries: Procedure(s): RIGHT TOTAL KNEE REVISION TO HINGED KNEE on 05/14/2018    Consultants:   Discharged Condition: Improved  Hospital Course: [name] is an 52 y.o. female who was admitted 05/14/2018 with a chief complaint of dislocated right total knee arthroplasty, with a final diagnosis of Dislocated Right Total Knee Arthroplasty.  Patient was brought to the operating room on 05/14/2018 and underwent Procedure(s): RIGHT TOTAL KNEE REVISION TO HINGED KNEE.  Patient has had episodes of hypotension during her hospital stay.  Blood pressure medicine has been held for this.  Patient was given perioperative antibiotics:  Anti-infectives (From admission, onward)   Start     Dose/Rate Route Frequency Ordered Stop   05/14/18 1600  ceFAZolin (ANCEF) IVPB 2g/100 mL premix     2 g 200 mL/hr over 30 Minutes Intravenous Every 6 hours 05/14/18 1232 05/14/18 2142   05/14/18 0700  ceFAZolin (ANCEF) 3 g in dextrose 5 % 50 mL IVPB     3 g 100 mL/hr over 30 Minutes Intravenous To Short Stay 05/13/18 0823 05/14/18 1019    .  Patient was given sequential compression devices, early ambulation, and aspirin for DVT prophylaxis.  Recent vital signs:  Patient Vitals for the past 24 hrs:  BP Temp Temp src Pulse Resp SpO2  05/18/18 0440 99/64 98.4 F (36.9 C) Oral 94 16 96 %  05/17/18 2157 100/61 - - 85 - -  05/17/18 1941 (!) 89/57 - - 85 16 95 %  05/17/18 1857 94/67 - - 83 - -  05/17/18 1853 (!) 92/55 - - 81 - -  05/17/18 1740 (!) 90/47 - - 78 - -  05/17/18 1620 (!) 102/52 - - - - -  .  Recent laboratory studies: No results found.  Discharge Medications:   Allergies as of 05/18/2018   No Known Allergies     Medication List    TAKE these medications   aspirin 325 MG EC tablet Take 1 tablet (325 mg total) by mouth daily with  breakfast.   atorvastatin 40 MG tablet Commonly known as:  LIPITOR Take 40 mg by mouth daily.   ENTRESTO 24-26 MG Generic drug:  sacubitril-valsartan Take 1 tablet by mouth 2 (two) times daily.   furosemide 40 MG tablet Commonly known as:  LASIX Take 1 tablet (40 mg total) by mouth daily. What changed:  how much to take   lisinopril-hydrochlorothiazide 20-25 MG tablet Commonly known as:  PRINZIDE,ZESTORETIC Take 1 tablet by mouth daily.   metoprolol succinate 25 MG 24 hr tablet Commonly known as:  TOPROL-XL Take 37.5 mg by mouth daily.   oxyCODONE-acetaminophen 10-325 MG tablet Commonly known as:  PERCOCET Take 1 tablet by mouth every 4 (four) hours as needed for pain. What changed:  when to take this            Durable Medical Equipment  (From admission, onward)         Start     Ordered   05/17/18 1059  For home use only DME standard manual wheelchair with seat cushion  Once    Comments:  Patient suffers from revision right total knee which impairs their ability to perform daily activities like ambulating in the home.  A canewill not resolve  issue with performing activities of daily living. A wheelchair will allow patient to safely  perform daily activities. Patient can safely propel the wheelchair in the home or has a caregiver who can provide assistance.  Accessories: elevating leg rests (ELRs), wheel locks, extensions and anti-tippers.  Needs bariatric wheelchair   05/17/18 1058   05/17/18 1058  For home use only DME 3 n 1  Once    Comments:  Needs Bariatric 3in1   05/17/18 1057          Diagnostic Studies: Xr Knee 1-2 Views Right  Result Date: 05/11/2018 2 view radiographs of the right knee shows dislocation of a total knee arthroplasty the femoral component jumped over the post.   Patient benefited maximally from their hospital stay and there were no complications.     Disposition: Discharge disposition: 01-Home or Self Care      Discharge  Instructions    Call MD / Call 911   Complete by:  As directed    If you experience chest pain or shortness of breath, CALL 911 and be transported to the hospital emergency room.  If you develope a fever above 101 F, pus (white drainage) or increased drainage or redness at the wound, or calf pain, call your surgeon's office.   Call MD / Call 911   Complete by:  As directed    If you experience chest pain or shortness of breath, CALL 911 and be transported to the hospital emergency room.  If you develope a fever above 101 F, pus (white drainage) or increased drainage or redness at the wound, or calf pain, call your surgeon's office.   Constipation Prevention   Complete by:  As directed    Drink plenty of fluids.  Prune juice may be helpful.  You may use a stool softener, such as Colace (over the counter) 100 mg twice a day.  Use MiraLax (over the counter) for constipation as needed.   Constipation Prevention   Complete by:  As directed    Drink plenty of fluids.  Prune juice may be helpful.  You may use a stool softener, such as Colace (over the counter) 100 mg twice a day.  Use MiraLax (over the counter) for constipation as needed.   Diet - low sodium heart healthy   Complete by:  As directed    Diet - low sodium heart healthy   Complete by:  As directed    Increase activity slowly as tolerated   Complete by:  As directed    Increase activity slowly as tolerated   Complete by:  As directed    Negative Pressure Wound Therapy - Incisional   Complete by:  As directed    Attached  the wound VAC to the [facility] plus portable wound VAC pump for discharge   Negative Pressure Wound Therapy - Incisional   Complete by:  As directed    Attach VAC dressing to the portable [facility] wound VAC pump for discharge     Follow-up Information    Newt Minion, MD In 1 week.   Specialty:  Orthopedic Surgery Contact information: [place] [address] [phone]             Signed: Newt Minion 05/18/2018, 6:57 AM

## 2018-05-17 NOTE — Progress Notes (Signed)
CSW received recommendation for SNF consult.  Consulted with patient, who reports she is declining SNF at this time. Client was previously in SNF placement at [facility] in June 2019, however she reports she continuously had to go through an appeals process that was too stressful.   Client prefers to do home health with her daughter who stays at home with her, home health aid, and PT/OT services.   Will alert RNCM.   Please consult if new needs arise.   Signing off.

## 2018-05-17 NOTE — Care Management Important Message (Signed)
Important Message  Patient Details  Name: KATRIN GRABEL MRN: [id number] Date of Birth: 02-Jan-1966   Medicare Important Message Given:  Yes    Fareeda Downard 05/17/2018, 3:43 PM

## 2018-05-17 NOTE — Progress Notes (Signed)
On call Dr. Marlou Sa contacted RN. RN stated that patient has low blood pressure, 90/47 pt states she is lightheaded. Dr. Marlou Sa stated we will just monitor patient for now.

## 2018-05-17 NOTE — Progress Notes (Signed)
Occupational Therapy Treatment Patient Details Name: [name] MRN: [id number] DOB: [birth year] Today's Date: 05/17/2018    History of present illness Pt is a 52 y/o female who is s/p R total knee revision to hinged knee, secondary to R TKA dislocation. PMH includes CHF, cardiomyopathy, HTN, sleep apnea, obesity, DM, and R total knee replacement.    OT comments  Pt demonstrating some progress toward OT goals but remains at high risk of falling and requires hands on assistance for all ADL and mobility tasks. She was able to complete stand-pivot simulated toilet transfer today with overall min assist but unable to take true steps and required "scooting/shuffling" her feet across the floor. Pt requiring total assistance for LB ADL and toileting hygiene at this time. Continue to recommend SNF level rehabilitation post-acute D/C and discussed this with pt. She is adamant that she will return home but is unable to state a plan for 24 hour assistance. If she is to return home she will need maximum home health services including OT, PT, and [place] aide. OT will continue to follow while admitted.    Follow Up Recommendations  SNF;Supervision/Assistance - 24 hour    Equipment Recommendations  3 in 1 bedside commode;Wheelchair (measurements OT);Wheelchair cushion (measurements OT)(bariatric)    Recommendations for Other Services PT consult    Precautions / Restrictions Precautions Precautions: Knee;Other (comment) Precaution Booklet Issued: No Precaution Comments: Assumed no ROM in R knee secondary to KI at all times order. Need clarification from MD. Wound VAC Required Braces or Orthoses: Knee Immobilizer - Right Knee Immobilizer - Right: On at all times Restrictions Weight Bearing Restrictions: Yes RLE Weight Bearing: Weight bearing as tolerated Other Position/Activity Restrictions: WBAT with KI on        Mobility Bed Mobility Overal bed mobility: Needs Assistance Bed Mobility: Sit to  Supine     Supine to sit: HOB elevated;Min assist     General bed mobility comments: Assist to manage RLE off of bed.   Transfers Overall transfer level: Needs assistance Equipment used: Rolling walker (2 wheeled) Transfers: Sit to/from [facility] Sit to Stand: Min assist;From elevated surface Stand pivot transfers: Min assist;From elevated surface       General transfer comment: Able to slowly power up to min assist with unsafe hand placement on RW. Pt shuffling/scooting feet to pivot to chair with min assist as well.     Balance Overall balance assessment: Needs assistance Sitting-balance support: Feet supported;No upper extremity supported Sitting balance-Leahy Scale: Good     Standing balance support: Bilateral upper extremity supported Standing balance-Leahy Scale: Poor Standing balance comment: Relies on RW and external assistance                           ADL either performed or assessed with clinical judgement   ADL Overall ADL's : Needs assistance/impaired     Grooming: Set up;Sitting               Lower Body Dressing: Total assistance;Sit to/from stand   Toilet Transfer: Minimal assistance;Stand-pivot;RW;BSC Armed forces technical officer Details (indicate cue type and reason): simulated from bed to recliner Toileting- Clothing Manipulation and Hygiene: Total assistance;Bed level Toileting - Clothing Manipulation Details (indicate cue type and reason): Pt requesting to utilize bed pan prior to mobility; although she was unable to have successful urination or bowel movement.      Functional mobility during ADLs: Minimal assistance;Rolling walker(stand-pivot only and significant increased time) General ADL  Comments: Pt highly unstable on her feet and only able to "shuffle"/scoot feet along the floor. Feel that she will need post-acute rehabilitation to maximize safety and build strength.      Vision       Perception     Praxis       Cognition Arousal/Alertness: Awake/alert Behavior During Therapy: WFL for tasks assessed/performed Overall Cognitive Status: Within Functional Limits for tasks assessed                                          Exercises     Shoulder Instructions       General Comments Pt adamant about discharging home rather than to SNF as she reports she had to make "bids" and does not want to deal with insurance. She has a highly unstable discharge plan and is not able to report how much assistance is truly available to her at home.     Pertinent Vitals/ Pain       Pain Assessment: 0-10 Pain Score: 10-Worst pain ever("beyond 10" although no grimacing/pain response noted) Pain Location: R knee  Pain Descriptors / Indicators: Grimacing;Guarding Pain Intervention(s): Limited activity within patient's tolerance;Monitored during session;Repositioned  Home Living                                          Prior Functioning/Environment              Frequency  Min 2X/week        Progress Toward Goals  OT Goals(current goals can now be found in the care plan section)  Progress towards OT goals: Progressing toward goals  Acute Rehab OT Goals Patient Stated Goal: "to go home and not to rehab" OT Goal Formulation: With patient Time For Goal Achievement: 05/29/18 Potential to Achieve Goals: Good ADL Goals Pt Will Perform Upper Body Dressing: with set-up;with supervision;sitting Pt Will Perform Lower Body Dressing: with set-up;with supervision;with adaptive equipment;sit to/from stand Pt Will Transfer to Toilet: with set-up;with supervision;bedside commode;stand pivot transfer Pt Will Perform Toileting - Clothing Manipulation and hygiene: with set-up;with supervision;sit to/from stand;with adaptive equipment;sitting/lateral leans Additional ADL Goal #1: Pt will perform bed mobility with supervision in preparation for ADLs  Plan Discharge plan needs to be  updated    Co-evaluation                 AM-PAC PT "6 Clicks" Daily Activity     Outcome Measure   Help from another person eating meals?: None Help from another person taking care of personal grooming?: A Little Help from another person toileting, which includes using toliet, bedpan, or urinal?: A Lot Help from another person bathing (including washing, rinsing, drying)?: A Lot Help from another person to put on and taking off regular upper body clothing?: A Little Help from another person to put on and taking off regular lower body clothing?: A Lot 6 Click Score: 16    End of Session Equipment Utilized During Treatment: Gait belt;Rolling walker(used sheet as gait belt)  OT Visit Diagnosis: Unsteadiness on feet (R26.81);Other abnormalities of gait and mobility (R26.89);Muscle weakness (generalized) (M62.81);Pain Pain - Right/Left: Right Pain - part of body: Knee   Activity Tolerance Patient tolerated treatment well   Patient Left in bed;with call bell/phone within reach;with family/visitor present  Nurse Communication Mobility status        Time: 4818-5909 OT Time Calculation (min): 50 min  Charges: OT General Charges $OT Visit: 1 Visit OT Treatments $Self Care/Home Management : 38-52 mins  Norman Herrlich, MS OTR/L  Pager: [place] A Faisal Stradling 05/17/2018, 11:31 AM

## 2018-05-17 NOTE — Progress Notes (Addendum)
Patient ID: [name], female   DOB: 1966-09-10, 52 y.o.   MRN: [id number] Anticipated LOS equal to or greater than 2 midnights due to - Age 24 and older with one or more of the following:  - Obesity  - Expected need for hospital services (PT, OT, Nursing) required for safe  discharge  - Anticipated need for postoperative skilled nursing care or inpatient rehab  - Active co-morbidities: morbid obesity OR   - Unanticipated findings during/Post Surgery: weakness  - Patient is a high risk of re-admission due to: Barriers to post-acute care (logistical, no family support in home)    Postoperative day 3 revision right total knee arthroplasty.  Patient's knee immobilizer is in place patient has 400 cc in the wound VAC canister.  Patient is slow to progress with therapy.  Therapy has recommended skilled nursing placement.  Patient states that it is too difficult for her to go to skilled nursing she is refusing skilled nursing and wishes to be discharged to home tomorrow.  Will plan for discharge to home patient feels like she will be safe.

## 2018-05-18 ENCOUNTER — Encounter (HOSPITAL_COMMUNITY): Payer: Self-pay | Admitting: Orthopedic Surgery

## 2018-05-18 NOTE — Progress Notes (Signed)
Occupational Therapy Treatment Patient Details Name: [name] MRN: [id number] DOB: 1965/11/26 Today's Date: 05/18/2018    History of present illness Pt is a 52 y/o female who is s/p R total knee revision to hinged knee, secondary to R TKA dislocation. PMH includes CHF, cardiomyopathy, HTN, sleep apnea, obesity, DM, and R total knee replacement.    OT comments  Pt is demonstrating progress toward OT goals at this time. Because pt is declining SNF placement, focused session on her safety with transfers in preparation for toileting and facilitated improved LB ADL participation. Pt able to don socks in long sitting with min assist today and requires mod assist overall for LB ADL. She was able to complete stand-pivot transfer today with min assist for stability. Continue to feel that pt would best benefit from post-acute rehabilitation at [facility] but she prefers home and as such, pt will need maximum home health services including OT and aide.    Follow Up Recommendations  SNF;Supervision/Assistance - 24 hour; (HHOT, HHPT, [place] aide due to pt declining SNF)   Equipment Recommendations  3 in 1 bedside commode;Wheelchair (measurements OT);Wheelchair cushion (measurements OT)    Recommendations for Other Services PT consult    Precautions / Restrictions Precautions Precautions: Knee;Fall Precaution Comments: Dr. Sharol Given reports patient can remove KI for ROM but must be on during weight bearing activities.   Required Braces or Orthoses: Knee Immobilizer - Right Knee Immobilizer - Right: On when out of bed or walking Restrictions Weight Bearing Restrictions: Yes RLE Weight Bearing: Weight bearing as tolerated Other Position/Activity Restrictions: WBAT with KI on        Mobility Bed Mobility Overal bed mobility: Needs Assistance Bed Mobility: Sit to Supine     Supine to sit: Min guard     General bed mobility comments: With significantly increased time, pt was able to transition to EOB  with guarding assistance.   Transfers Overall transfer level: Needs assistance Equipment used: Rolling walker (2 wheeled) Transfers: Sit to/from [facility] Sit to Stand: Min assist Stand pivot transfers: Min assist;From elevated surface       General transfer comment: Min assist for stability during transition. Very slow and guarded with all standing tasks. Better able to pick up feet during pivot with cues.     Balance Overall balance assessment: Needs assistance Sitting-balance support: Feet supported;No upper extremity supported Sitting balance-Leahy Scale: Good     Standing balance support: Bilateral upper extremity supported Standing balance-Leahy Scale: Poor Standing balance comment: Relies on BUE support at all times                           ADL either performed or assessed with clinical judgement   ADL Overall ADL's : Needs assistance/impaired                     Lower Body Dressing: Moderate assistance;Sit to/from stand Lower Body Dressing Details (indicate cue type and reason): Able to don socks in long sitting position this session with min assist. Overall mod assist, as pt requires assistance for pants up/down in standing.  Toilet Transfer: Minimal Scientist, forensic Details (indicate cue type and reason): Simulated from bed to chair. Pt moving slowly due to pain.  Toileting- Clothing Manipulation and Hygiene: Total assistance;Sit to/from stand       Functional mobility during ADLs: Minimal assistance;Rolling walker(stand-pivot only) General ADL Comments: Pt educated concerning safety with multiple sit<>stands from recliner to  simulate wheelchair.      Vision       Perception     Praxis      Cognition Arousal/Alertness: Awake/alert Behavior During Therapy: WFL for tasks assessed/performed Overall Cognitive Status: No family/caregiver present to determine baseline cognitive functioning Area of  Impairment: Safety/judgement                         Safety/Judgement: Decreased awareness of safety     General Comments: Pt processing information and instructions well. However, she does have limited understanding of her current situation and safety.        Exercises     Shoulder Instructions       General Comments      Pertinent Vitals/ Pain       Pain Assessment: Faces Faces Pain Scale: Hurts whole lot Pain Location: R knee  Pain Descriptors / Indicators: Grimacing;Guarding Pain Intervention(s): Limited activity within patient's tolerance;Monitored during session;Repositioned  Home Living                                          Prior Functioning/Environment              Frequency  Min 2X/week        Progress Toward Goals  OT Goals(current goals can now be found in the care plan section)  Progress towards OT goals: Progressing toward goals  Acute Rehab OT Goals Patient Stated Goal: "to go home and not to rehab" OT Goal Formulation: With patient Time For Goal Achievement: 05/29/18 Potential to Achieve Goals: Good  Plan Discharge plan needs to be updated    Co-evaluation                 AM-PAC PT "6 Clicks" Daily Activity     Outcome Measure   Help from another person eating meals?: None Help from another person taking care of personal grooming?: A Little Help from another person toileting, which includes using toliet, bedpan, or urinal?: A Lot Help from another person bathing (including washing, rinsing, drying)?: A Lot Help from another person to put on and taking off regular upper body clothing?: A Little Help from another person to put on and taking off regular lower body clothing?: A Lot 6 Click Score: 16    End of Session Equipment Utilized During Treatment: Gait belt;Rolling walker  OT Visit Diagnosis: Unsteadiness on feet (R26.81);Other abnormalities of gait and mobility (R26.89);Muscle weakness  (generalized) (M62.81);Pain Pain - Right/Left: Right Pain - part of body: Knee   Activity Tolerance Patient tolerated treatment well   Patient Left in bed;with call bell/phone within reach;with family/visitor present   Nurse Communication Mobility status        Time: 8786-7672 OT Time Calculation (min): 36 min  Charges: OT General Charges $OT Visit: 1 Visit OT Treatments $Self Care/Home Management : 23-37 mins  Belva Agee, OTR/L [place] Pager [phone] Office [place] A Jerrard Bradburn 05/18/2018, 9:55 AM

## 2018-05-18 NOTE — Progress Notes (Signed)
Patient ID: [name], female   DOB: 1966-01-12, 52 y.o.   MRN: [id number] Patient is status post revision total knee arthroplasty in the right.  She states she has had episodes of low blood pressure.  Patient is comfortable this morning.  There is 100 cc in a new wound VAC canister.  No active bleeding at this time.  Plan for discharge to home today after physical therapy.  Plan for discharge on the portable [employer] plus wound VAC pump.

## 2018-05-18 NOTE — Progress Notes (Signed)
Patient discharge home today. Discharge instructions explained to patient and she verbalized understanding. Took all personal belongings. Wound vac switched to prevena. Left unit via PTAR.

## 2018-05-18 NOTE — Plan of Care (Signed)
  Problem: Education: Goal: Knowledge of General Education information will improve Description: Including pain rating scale, medication(s)/side effects and non-pharmacologic comfort measures Outcome: Progressing   Problem: Health Behavior/Discharge Planning: Goal: Ability to manage health-related needs will improve Outcome: Progressing   Problem: Clinical Measurements: Goal: Ability to maintain clinical measurements within normal limits will improve Outcome: Progressing   Problem: Activity: Goal: Risk for activity intolerance will decrease Outcome: Progressing   Problem: Pain Managment: Goal: General experience of comfort will improve Outcome: Progressing   Problem: Safety: Goal: Ability to remain free from injury will improve Outcome: Progressing   

## 2018-05-18 NOTE — Progress Notes (Signed)
05/18/2018   WHEELCHAIR  Patient suffers from R TKA revision which impairs their ability to perform daily activities like get to the bathroom, to the kitchen in the home.  A walker alone will not resolve the issues with performing activities of daily living as she cannot walk yet. A wheelchair will allow patient to safely perform daily activities.  The patient can self propel in the home or has a caregiver who can provide assistance.     Barbarann Ehlers Kelliann Pendergraph, PT, DPT  Acute Rehabilitation [phone] pager [phone]) [phone] office

## 2018-05-18 NOTE — Progress Notes (Signed)
Physical Therapy Treatment Patient Details Name: [name] MRN: [id number] DOB: 19-Oct-1965 Today's Date: 05/18/2018    History of Present Illness Pt is a 52 y/o female who is s/p R total knee revision to hinged knee, secondary to R TKA dislocation. PMH includes CHF, cardiomyopathy, HTN, sleep apnea, obesity, DM, and R total knee replacement.     PT Comments    Pt performed limited gait training and functional mobility.  Pt required cues for upper trunk control and step sequencing.  Pt started to fatigue quickly after pivot from recliner to commode and commode back to bed. She complained of dizziness and required bed to brought toward her bottom so she could sit down.  Pt returned to bed and BP obtained 124/62.  Pt used shoes during transfers as her feet slide less with shoes on.  Pt is making slow progress but I cannot stress enough the need for rehab in a post acute setting.  Pt remains adamant to refuse SNF placement at this time and plans to return home today. Will recommend HHPT if she chooses to d/c home.  ** Pt refusing her bariatric commode for home as she reports she needs a commode with plastic across vs. Just a toilet seat.  Do we have this as an option from [facility]?     Follow Up Recommendations  SNF;Supervision for mobility/OOB(Pt is declining SNF so HHPT will be required for d/c home.  )     Equipment Recommendations  3in1 (PT);Wheelchair (measurements PT);Wheelchair cushion (measurements PT);Other (comment)(Bariartic WC with elevating leg rests, Bariartric commode)    Recommendations for Other Services OT consult     Precautions / Restrictions Precautions Precautions: Knee;Fall Precaution Booklet Issued: No Precaution Comments: Dr. Sharol Given reports patient can remove KI for ROM but must be on during weight bearing activities.   Required Braces or Orthoses: Knee Immobilizer - Right Knee Immobilizer - Right: On when out of bed or walking Restrictions Weight Bearing  Restrictions: Yes RLE Weight Bearing: Weight bearing as tolerated Other Position/Activity Restrictions: WBAT with KI on     Mobility  Bed Mobility Overal bed mobility: Needs Assistance Bed Mobility: Sit to Supine     Supine to sit: Min guard Sit to supine: Mod assist   General bed mobility comments: Pt required moderated assistance to lift B LEs back into bed against gravity.    Transfers Overall transfer level: Needs assistance Equipment used: Rolling walker (2 wheeled) Transfers: Sit to/from [facility] Sit to Stand: Min assist;Supervision(sit to stand supervision, stand to sit min assist to slide RLE forward. ) Stand pivot transfers: Supervision       General transfer comment: Cues for hand placement to and from seated surface.  Pt intiially trying to stand from recliner chair and due to her weight the locked chair started to slide.  Required bracing recliner against wall for her to achieve standing unassisted.  Informed her to use this practice at home with her WC to avoid sliding.    Ambulation/Gait Ambulation/Gait assistance: Supervision Gait Distance (Feet): 4 Feet(x2 trials for a total of 8 ft.  Pt declined further gait distance as she reports dizziness BP taken and pressure 124/62.  ) Assistive device: Rolling walker (2 wheeled) Gait Pattern/deviations: Step-to pattern;Wide base of support;Antalgic;Decreased stride length     General Gait Details: Pt remains to present with wide waddling BOS.  Fatigues quickly and when she fatigues she requires min assistance and bed to brought to her bottom.     Stairs  Wheelchair Mobility    Modified Rankin (Stroke Patients Only)       Balance Overall balance assessment: Needs assistance Sitting-balance support: Feet supported;No upper extremity supported Sitting balance-Leahy Scale: Fair     Standing balance support: Bilateral upper extremity supported Standing balance-Leahy Scale:  Poor Standing balance comment: Relies on BUE support at all times                            Cognition Arousal/Alertness: Awake/alert Behavior During Therapy: WFL for tasks assessed/performed Overall Cognitive Status: No family/caregiver present to determine baseline cognitive functioning Area of Impairment: Safety/judgement                         Safety/Judgement: Decreased awareness of safety     General Comments: Pt processing information and instructions well. However, she does have limited understanding of her current situation and safety.      Exercises Total Joint Exercises Ankle Circles/Pumps: AROM;Both;20 reps Quad Sets: AROM;Right;10 reps Heel Slides: AAROM;Right;10 reps;Supine Hip ABduction/ADduction: AROM;Right;10 reps;Supine Straight Leg Raises: AAROM;Right;10 reps Goniometric ROM: 0- 20 degrees flexion.      General Comments        Pertinent Vitals/Pain Pain Assessment: Faces Pain Score: 6  Faces Pain Scale: Hurts whole lot Pain Location: R knee  Pain Descriptors / Indicators: Grimacing;Guarding Pain Intervention(s): Monitored during session;Repositioned;Ice applied    Home Living                      Prior Function            PT Goals (current goals can now be found in the care plan section) Acute Rehab PT Goals Patient Stated Goal: "to go home and not to rehab" Potential to Achieve Goals: Fair Progress towards PT goals: Progressing toward goals    Frequency    7X/week      PT Plan Discharge plan needs to be updated;Current plan remains appropriate    Co-evaluation              AM-PAC PT "6 Clicks" Daily Activity  Outcome Measure  Difficulty turning over in bed (including adjusting bedclothes, sheets and blankets)?: Unable Difficulty moving from lying on back to sitting on the side of the bed? : Unable Difficulty sitting down on and standing up from a chair with arms (e.g., wheelchair, bedside  commode, etc,.)?: Unable Help needed moving to and from a bed to chair (including a wheelchair)?: A Little Help needed walking in hospital room?: A Lot Help needed climbing 3-5 steps with a railing? : A Lot 6 Click Score: 10    End of Session Equipment Utilized During Treatment: Gait belt;Right knee immobilizer Activity Tolerance: Patient limited by pain Patient left: with call bell/phone within reach;in bed;with nursing/sitter in room Nurse Communication: Mobility status PT Visit Diagnosis: Muscle weakness (generalized) (M62.81);Difficulty in walking, not elsewhere classified (R26.2);Pain Pain - Right/Left: Right Pain - part of body: Knee     Time: 1610-9604 PT Time Calculation (min) (ACUTE ONLY): 38 min  Charges:  $Gait Training: 8-22 mins $Therapeutic Exercise: 8-22 mins $Therapeutic Activity: 8-22 mins                     Governor Rooks, PTA pager [phone]    Cristela Blue 05/18/2018, 10:53 AM

## 2018-05-20 DIAGNOSIS — G894 Chronic pain syndrome: Secondary | ICD-10-CM | POA: Diagnosis not present

## 2018-05-20 DIAGNOSIS — G802 Spastic hemiplegic cerebral palsy: Secondary | ICD-10-CM | POA: Diagnosis not present

## 2018-05-21 ENCOUNTER — Telehealth (INDEPENDENT_AMBULATORY_CARE_PROVIDER_SITE_OTHER): Payer: Self-pay

## 2018-05-21 NOTE — Telephone Encounter (Signed)
Called and sw pt to advise that if the vac is alarming and will not stop she may remove it and apply 4x4 and ace wrap keep the incision clean and dry and come in on Monday. The pt states that the vac is not alarming and seems to be functioning without complication and that she will leave this intact until her appt on Friday or call if any other difficulties arise.

## 2018-05-21 NOTE — Telephone Encounter (Signed)
Patient called and states she is having problems with wound VAC. States its beeping and she has to turn off and on machine. Offered her to come in this afternoon and states she cannot come. Then she said I will check to see if someone can bring me or if  [facility] can come.   S/P Right total knee revision -05/14/18.   CB [phone]

## 2018-05-21 NOTE — Telephone Encounter (Signed)
FYI

## 2018-05-24 ENCOUNTER — Telehealth (INDEPENDENT_AMBULATORY_CARE_PROVIDER_SITE_OTHER): Payer: Self-pay | Admitting: Radiology

## 2018-05-24 ENCOUNTER — Other Ambulatory Visit (INDEPENDENT_AMBULATORY_CARE_PROVIDER_SITE_OTHER): Payer: Self-pay

## 2018-05-24 DIAGNOSIS — G4733 Obstructive sleep apnea (adult) (pediatric): Secondary | ICD-10-CM | POA: Diagnosis not present

## 2018-05-24 DIAGNOSIS — E785 Hyperlipidemia, unspecified: Secondary | ICD-10-CM | POA: Diagnosis not present

## 2018-05-24 DIAGNOSIS — E1122 Type 2 diabetes mellitus with diabetic chronic kidney disease: Secondary | ICD-10-CM | POA: Diagnosis not present

## 2018-05-24 DIAGNOSIS — E1140 Type 2 diabetes mellitus with diabetic neuropathy, unspecified: Secondary | ICD-10-CM | POA: Diagnosis not present

## 2018-05-24 DIAGNOSIS — Z794 Long term (current) use of insulin: Secondary | ICD-10-CM | POA: Diagnosis not present

## 2018-05-24 DIAGNOSIS — I130 Hypertensive heart and chronic kidney disease with heart failure and stage 1 through stage 4 chronic kidney disease, or unspecified chronic kidney disease: Secondary | ICD-10-CM | POA: Diagnosis not present

## 2018-05-24 DIAGNOSIS — Z7982 Long term (current) use of aspirin: Secondary | ICD-10-CM | POA: Diagnosis not present

## 2018-05-24 DIAGNOSIS — N182 Chronic kidney disease, stage 2 (mild): Secondary | ICD-10-CM | POA: Diagnosis not present

## 2018-05-24 DIAGNOSIS — I509 Heart failure, unspecified: Secondary | ICD-10-CM | POA: Diagnosis not present

## 2018-05-24 DIAGNOSIS — K219 Gastro-esophageal reflux disease without esophagitis: Secondary | ICD-10-CM | POA: Diagnosis not present

## 2018-05-24 DIAGNOSIS — T84012D Broken internal right knee prosthesis, subsequent encounter: Secondary | ICD-10-CM | POA: Diagnosis not present

## 2018-05-24 NOTE — Telephone Encounter (Signed)
Pt is s/p a right total knee. Call to advise ok for PT orders below to work on progressive ROM and wtb as tolerated with immobilizer.

## 2018-05-24 NOTE — Telephone Encounter (Signed)
Called and lm on vm to advise and orders faxed as requested.

## 2018-05-24 NOTE — Progress Notes (Signed)
This encounter was created in error - please disregard.

## 2018-05-24 NOTE — Telephone Encounter (Signed)
Dr. Sharol Given, Cindee Salt needs verbal orders for PT 2 wk 1, 1 wk,1 and a fax with activity use for right knee fax to: [phone]

## 2018-05-28 ENCOUNTER — Encounter (INDEPENDENT_AMBULATORY_CARE_PROVIDER_SITE_OTHER): Payer: Self-pay | Admitting: Physician Assistant

## 2018-05-28 ENCOUNTER — Ambulatory Visit (INDEPENDENT_AMBULATORY_CARE_PROVIDER_SITE_OTHER): Payer: Medicare Other | Admitting: Physician Assistant

## 2018-05-28 VITALS — Ht 63.0 in | Wt 338.0 lb

## 2018-05-28 DIAGNOSIS — Z6841 Body Mass Index (BMI) 40.0 and over, adult: Secondary | ICD-10-CM

## 2018-05-28 DIAGNOSIS — E1142 Type 2 diabetes mellitus with diabetic polyneuropathy: Secondary | ICD-10-CM

## 2018-05-28 DIAGNOSIS — I509 Heart failure, unspecified: Secondary | ICD-10-CM | POA: Diagnosis not present

## 2018-05-28 DIAGNOSIS — E1140 Type 2 diabetes mellitus with diabetic neuropathy, unspecified: Secondary | ICD-10-CM | POA: Diagnosis not present

## 2018-05-28 DIAGNOSIS — G4733 Obstructive sleep apnea (adult) (pediatric): Secondary | ICD-10-CM | POA: Diagnosis not present

## 2018-05-28 DIAGNOSIS — Z96651 Presence of right artificial knee joint: Secondary | ICD-10-CM

## 2018-05-28 DIAGNOSIS — K219 Gastro-esophageal reflux disease without esophagitis: Secondary | ICD-10-CM | POA: Diagnosis not present

## 2018-05-28 DIAGNOSIS — E785 Hyperlipidemia, unspecified: Secondary | ICD-10-CM | POA: Diagnosis not present

## 2018-05-28 DIAGNOSIS — E1122 Type 2 diabetes mellitus with diabetic chronic kidney disease: Secondary | ICD-10-CM | POA: Diagnosis not present

## 2018-05-28 DIAGNOSIS — N182 Chronic kidney disease, stage 2 (mild): Secondary | ICD-10-CM | POA: Diagnosis not present

## 2018-05-28 DIAGNOSIS — T84012D Broken internal right knee prosthesis, subsequent encounter: Secondary | ICD-10-CM | POA: Diagnosis not present

## 2018-05-28 DIAGNOSIS — Z794 Long term (current) use of insulin: Secondary | ICD-10-CM | POA: Diagnosis not present

## 2018-05-28 DIAGNOSIS — E440 Moderate protein-calorie malnutrition: Secondary | ICD-10-CM

## 2018-05-28 DIAGNOSIS — I130 Hypertensive heart and chronic kidney disease with heart failure and stage 1 through stage 4 chronic kidney disease, or unspecified chronic kidney disease: Secondary | ICD-10-CM | POA: Diagnosis not present

## 2018-05-28 DIAGNOSIS — Z7982 Long term (current) use of aspirin: Secondary | ICD-10-CM | POA: Diagnosis not present

## 2018-05-28 MED ORDER — OXYCODONE-ACETAMINOPHEN 10-325 MG PO TABS: 1.0000 | ORAL_TABLET | ORAL | 0 refills | Status: DC | PRN

## 2018-05-28 NOTE — Progress Notes (Signed)
Office Visit Note   Patient: [name]           Date of Birth: 14-May-1966           MRN: [id number] Visit Date: 05/28/2018              Requested by: Care, [employer] [facility] [address] [place], [place] [address] PCP: Care, [facility]  Chief Complaint  Patient presents with  . Right Knee - Follow-up, Routine Post Op      HPI: Patient is a 52 year old female who is seen for postoperative follow-up following revision of a right total knee replacement.  She had previously undergone a right total knee arthroplasty but subsequently had a dislocation of the prosthesis and required revision total knee replacement with hinged replacement on 05/14/2018. She is now 14 days postop.  She has had the Praveena plus VAC in place and it is alarming today on her arrival to the clinic.  She reports her pain is a 10 out of 10 at times and she is still needing to take Percocet 10/325 mg on a regular basis.  She is working with home health but has not really been doing much standing or ambulation as yet.  She can be weightbearing as tolerated in her knee immobilizer with physical therapy for progressive ambulation.    Assessment & Plan: Visit Diagnoses:  1. Status post revision of total replacement of right knee   2. BMI 50.0-59.9, adult ([place])   3. Diabetic polyneuropathy associated with type 2 diabetes mellitus ([place])   4. Moderate protein malnutrition (HCC)     Plan: The Praveena VAC was removed.  She will apply gauze or ABD pads to the incisional area and Ace wrap and then reapply her knee immobilizer daily.  She can wash and clean the leg in the incisional area with soap and water.  She could continue to work with physical therapy for progressive activities.  Again she is weightbearing as tolerated in her knee immobilizer.  She will need x-rays at the next visit.  Her Percocet was refilled  Follow-Up Instructions: Return in about 6 days (around  06/03/2018).   Ortho Exam  Patient is alert, oriented, no adenopathy, well-dressed, normal affect, normal respiratory effort. The Praveena VAC was removed from the right knee incisional area and the incision is intact with minimal serous appearing drainage.  Sutures are in place.  She has mild edema.  No signs of cellulitis.  Imaging: No results found. No images are attached to the encounter.  Labs: Lab Results  Component Value Date   HGBA1C 7.1 (H) 05/14/2018   HGBA1C 11.3 (H) 01/03/2013   HGBA1C 8.6 (H) 03/05/2011   REPTSTATUS 04/16/2011 FINAL 04/10/2011   CULT NO GROWTH 5 DAYS 04/10/2011     Lab Results  Component Value Date   ALBUMIN 2.9 (L) 02/10/2018   ALBUMIN 2.5 (L) 11/10/2017   ALBUMIN 3.0 (L) 05/14/2014    Body mass index is 59.87 kg/m.  Orders:  No orders of the defined types were placed in this encounter.  Meds ordered this encounter  Medications  . oxyCODONE-acetaminophen (PERCOCET) 10-325 MG tablet    Sig: Take 1 tablet by mouth every 4 (four) hours as needed for pain.    Dispense:  40 tablet    Refill:  0     Procedures: No procedures performed  Clinical Data: No additional findings.  ROS:  All other systems negative, except as noted in  the HPI. Review of Systems  Objective: Vital Signs: Ht 5\' 3"  (1.6 m)   Wt (!) 337 lb 15.5 oz (153.3 kg)   LMP 11/09/2012   BMI 59.87 kg/m   Specialty Comments:  No specialty comments available.  PMFS History: Patient Active Problem List   Diagnosis Date Noted  . S/P revision of total knee, right 05/14/2018  . Dislocated knee, right, sequela   . S/P revision of total knee 02/17/2018  . Failed total right knee replacement ([place])   . Chronic renal insufficiency, stage 2 (mild) 11/10/2017  . Acute CHF (congestive heart failure) ([place]) 11/10/2017  . Failed total knee arthroplasty, sequela 08/31/2017  . Lower extremity cellulitis 01/03/2013  . Diabetes mellitus ([place]) 01/03/2013  . Morbid (severe)  obesity due to excess calories ([place]) 11/12/2006  . TOBACCO DEPENDENCE 11/12/2006  . DEPRESSIVE DISORDER, NOS 11/12/2006  . HYPERTENSION, BENIGN SYSTEMIC 11/12/2006  . GASTROESOPHAGEAL REFLUX, NO ESOPHAGITIS 11/12/2006  . ACNE 11/12/2006  . OSTEOARTHRITIS, LOWER LEG 11/12/2006  . APNEA, SLEEP 11/12/2006   Past Medical History:  Diagnosis Date  . Arthritis    "hands, arms, feet, back, knees" (02/17/2018)  . Cardiomyopathy [facility])    From Dr. Bonney Roussel office notes from 01/13/18  . CHF (congestive heart failure) ([place])   . Chronic lower back pain   . Diabetic neuropathy ([place])   . Family history of adverse reaction to anesthesia    Mother is hard to arouse and blood pressure drops  . GERD (gastroesophageal reflux disease)   . High cholesterol   . Hypertension   . Sleep apnea    "need new machine"  - does not use a cpap (02/17/2018)  . Type II diabetes mellitus ([place])    no longer on medications (02/17/2018)    Family History  Problem Relation Age of Onset  . Kidney disease Mother   . Congestive Heart Failure Mother   . Hypertension Father     Past Surgical History:  Procedure Laterality Date  . ANTERIOR CERVICAL DECOMP/DISCECTOMY FUSION  03/06/2010   C4-7 w/corpectomy/notes 03/17/2010  . BACK SURGERY    . CARPAL TUNNEL RELEASE Right   . HARDWARE REVISION  04/10/2010   cervical hardware revision screw replacement C4/notes 04/12/2010  . JOINT REPLACEMENT    . LAPAROSCOPIC CHOLECYSTECTOMY  1999  . LUMBAR LAMINECTOMY/DECOMPRESSION MICRODISCECTOMY  09/2010   Archie Endo 10/05/2010  . MULTIPLE EXTRACTIONS WITH ALVEOLOPLASTY [name] 01/19/2015   Procedure: MULTIPLE EXTRACTIONS Three, Four, Five, Seven, eight, nine, ten, eleven, twelve, fourteen, eighteen, twenty-one, twenty-eight, twenty-nine, thirty-one with Alveoloplasty.  ;  Surgeon: Diona Browner, DDS;  Location: [facility];  Service: Oral Surgery;  Laterality: [name];  . REPLACEMENT TOTAL KNEE BILATERAL Bilateral 10/2007-03/2008   "left-right"  . TOENAIL  EXCISION Bilateral    great toes  . TONSILLECTOMY    . TOTAL KNEE REVISION Right 02/17/2018  . TOTAL KNEE REVISION Right 02/17/2018   Procedure: RIGHT TOTAL KNEE REVISION;  Surgeon: Newt Minion, MD;  Location: [place] [facility];  Service: Orthopedics;  Laterality: Right;  . TOTAL KNEE REVISION Right 05/14/2018   Procedure: RIGHT TOTAL KNEE REVISION TO HINGED KNEE;  Surgeon: Newt Minion, MD;  Location: [facility];  Service: Orthopedics;  Laterality: Right;   Social History   Occupational History  . Not on file  Tobacco Use  . Smoking status: Current Every Day Smoker    Packs/day: 0.50    Years: 32.00    Pack years: 16.00    Types: Cigarettes  . Smokeless tobacco: Never Used  Substance and Sexual Activity  . Alcohol use: Not Currently  . Drug use: Not Currently    Types: Marijuana  . Sexual activity: Not Currently

## 2018-06-02 DIAGNOSIS — G802 Spastic hemiplegic cerebral palsy: Secondary | ICD-10-CM | POA: Diagnosis not present

## 2018-06-04 ENCOUNTER — Other Ambulatory Visit: Payer: Self-pay

## 2018-06-04 ENCOUNTER — Ambulatory Visit (INDEPENDENT_AMBULATORY_CARE_PROVIDER_SITE_OTHER): Payer: Medicare Other | Admitting: Physician Assistant

## 2018-06-04 ENCOUNTER — Encounter (INDEPENDENT_AMBULATORY_CARE_PROVIDER_SITE_OTHER): Payer: Self-pay | Admitting: Physician Assistant

## 2018-06-04 ENCOUNTER — Ambulatory Visit (INDEPENDENT_AMBULATORY_CARE_PROVIDER_SITE_OTHER): Payer: Medicare Other

## 2018-06-04 VITALS — Ht 63.0 in | Wt 337.4 lb

## 2018-06-04 DIAGNOSIS — Z96651 Presence of right artificial knee joint: Secondary | ICD-10-CM

## 2018-06-04 MED ORDER — OXYCODONE-ACETAMINOPHEN 10-325 MG PO TABS: 1.0000 | ORAL_TABLET | ORAL | 0 refills | Status: DC | PRN

## 2018-06-04 MED ORDER — OXYCODONE HCL 5 MG PO CAPS: 5.0000 mg | ORAL_CAPSULE | ORAL | 0 refills | Status: DC | PRN

## 2018-06-04 NOTE — Addendum Note (Signed)
Addended by: Milas Gain on: 06/04/2018 04:29 PM   Modules accepted: Orders

## 2018-06-04 NOTE — Progress Notes (Addendum)
Office Visit Note   Patient: [name]           Date of Birth: [birth year]           MRN: [id number] Visit Date: 06/04/2018              Requested by: Care, [facility] [address] [place], [place] [address] PCP: [facility], [facility]  Chief Complaint  Patient presents with  . Right Knee - Pain      HPI: Patient is a 52 year old female who is seen for postoperative follow-up following right total knee revision utilizing a hinged revision total knee on 05/14/2018. She is 3 weeks postop.  She reports her pain is gradually improving.  She is working with therapies and they are beginning some gentle range of motion out of her knee immobilizer.  She is pleased with her progress.  Assessment & Plan: Visit Diagnoses:  1. Status post revision of total replacement of right knee     Plan: Sutures were harvested today. Steri strips applied to the central incisional widening.  She will continue to work with therapies, allowing some gentle range of motion out of her knee immobilizer.  Her Percocet was refilled this visit.  She will follow-up in 1 week or sooner should she have difficulties in the interim.  Follow-Up Instructions: Return in about 6 days (around 06/10/2018).   Ortho Exam  Patient is alert, oriented, no adenopathy, well-dressed, normal affect, normal respiratory effort. Patient presents at the wheelchair level.  She has her right knee immobilizer on.  Her incision was clean dry and intact.  Following suture removal she did have slight separation about the central incision line and Steri-Strips were applied over this area.  There are no signs of cellulitis.  No signs of infection.  She has full knee extension the knee was not flexed today due to the slight separation of her incision.  Imaging: Xr Knee 1-2 Views Right  Result Date: 06/04/2018 Good position and alignment of components of hinged knee replacement  No images  are attached to the encounter.  Labs: Lab Results  Component Value Date   HGBA1C 7.1 (H) 05/14/2018   HGBA1C 11.3 (H) 01/03/2013   HGBA1C 8.6 (H) 03/05/2011   REPTSTATUS 04/16/2011 FINAL 04/10/2011   CULT NO GROWTH 5 DAYS 04/10/2011     Lab Results  Component Value Date   ALBUMIN 2.9 (L) 02/10/2018   ALBUMIN 2.5 (L) 11/10/2017   ALBUMIN 3.0 (L) 05/14/2014    Body mass index is 59.76 kg/m.  Orders:  Orders Placed This Encounter  Procedures  . XR Knee 1-2 Views Right   Meds ordered this encounter  Medications  . oxycodone (OXY-IR) 5 MG capsule    Sig: Take 1-2 capsules (5-10 mg total) by mouth every 4 (four) hours as needed for pain.    Dispense:  40 capsule    Refill:  0     Procedures: No procedures performed  Clinical Data: No additional findings.  ROS:  All other systems negative, except as noted in the HPI. Review of Systems  Objective: Vital Signs: Ht 5\' 3"  (1.6 m)   Wt (!) 337 lb 5.9 oz (153 kg)   LMP 11/09/2012   BMI 59.76 kg/m   Specialty Comments:  No specialty comments available.  PMFS History: Patient Active Problem List   Diagnosis Date Noted  . S/P revision of total knee, right 05/14/2018  . Dislocated knee, right, sequela   .  S/P revision of total knee 02/17/2018  . Failed total right knee replacement ([place])   . Chronic renal insufficiency, stage 2 (mild) 11/10/2017  . Acute CHF (congestive heart failure) ([place]) 11/10/2017  . Failed total knee arthroplasty, sequela 08/31/2017  . Lower extremity cellulitis 01/03/2013  . Diabetes mellitus ([place]) 01/03/2013  . Morbid (severe) obesity due to excess calories ([place]) 11/12/2006  . TOBACCO DEPENDENCE 11/12/2006  . DEPRESSIVE DISORDER, NOS 11/12/2006  . HYPERTENSION, BENIGN SYSTEMIC 11/12/2006  . GASTROESOPHAGEAL REFLUX, NO ESOPHAGITIS 11/12/2006  . ACNE 11/12/2006  . OSTEOARTHRITIS, LOWER LEG 11/12/2006  . APNEA, SLEEP 11/12/2006   Past Medical History:  Diagnosis Date  . Arthritis     "hands, arms, feet, back, knees" (02/17/2018)  . Cardiomyopathy [facility])    From Dr. Bonney Roussel office notes from 01/13/18  . CHF (congestive heart failure) ([place])   . Chronic lower back pain   . Diabetic neuropathy ([place])   . Family history of adverse reaction to anesthesia    Mother is hard to arouse and blood pressure drops  . GERD (gastroesophageal reflux disease)   . High cholesterol   . Hypertension   . Sleep apnea    "need new machine"  - does not use a cpap (02/17/2018)  . Type II diabetes mellitus ([place])    no longer on medications (02/17/2018)    Family History  Problem Relation Age of Onset  . Kidney disease Mother   . Congestive Heart Failure Mother   . Hypertension Father     Past Surgical History:  Procedure Laterality Date  . ANTERIOR CERVICAL DECOMP/DISCECTOMY FUSION  03/06/2010   C4-7 w/corpectomy/notes 03/17/2010  . BACK SURGERY    . CARPAL TUNNEL RELEASE Right   . HARDWARE REVISION  04/10/2010   cervical hardware revision screw replacement C4/notes 04/12/2010  . JOINT REPLACEMENT    . LAPAROSCOPIC CHOLECYSTECTOMY  1999  . LUMBAR LAMINECTOMY/DECOMPRESSION MICRODISCECTOMY  09/2010   Archie Endo 10/05/2010  . MULTIPLE EXTRACTIONS WITH ALVEOLOPLASTY [name] 01/19/2015   Procedure: MULTIPLE EXTRACTIONS Three, Four, Five, Seven, eight, nine, ten, eleven, twelve, fourteen, eighteen, twenty-one, twenty-eight, twenty-nine, thirty-one with Alveoloplasty.  ;  Surgeon: Diona Browner, DDS;  Location: [facility];  Service: Oral Surgery;  Laterality: [name];  . REPLACEMENT TOTAL KNEE BILATERAL Bilateral 10/2007-03/2008   "left-right"  . TOENAIL EXCISION Bilateral    great toes  . TONSILLECTOMY    . TOTAL KNEE REVISION Right 02/17/2018  . TOTAL KNEE REVISION Right 02/17/2018   Procedure: RIGHT TOTAL KNEE REVISION;  Surgeon: Newt Minion, MD;  Location: [facility];  Service: Orthopedics;  Laterality: Right;  . TOTAL KNEE REVISION Right 05/14/2018   Procedure: RIGHT TOTAL KNEE REVISION TO HINGED KNEE;  Surgeon:  Newt Minion, MD;  Location: [facility];  Service: Orthopedics;  Laterality: Right;   Social History   Occupational History  . Not on file  Tobacco Use  . Smoking status: Current Every Day Smoker    Packs/day: 0.50    Years: 32.00    Pack years: 16.00    Types: Cigarettes  . Smokeless tobacco: Never Used  Substance and Sexual Activity  . Alcohol use: Not Currently  . Drug use: Not Currently    Types: Marijuana  . Sexual activity: Not Currently

## 2018-06-04 NOTE — Patient Outreach (Signed)
[facility]) Care Management  06/04/2018  [name] September 22, 1965 [id number]   Medication Adherence call to [name] left a message for patient to call back patient is due on Atorvastatin 40 mg. [name] is showing past due under [place].  [facility] Management Direct Dial [phone]  Fax [phone] [name]

## 2018-06-08 DIAGNOSIS — T84028D Dislocation of other internal joint prosthesis, subsequent encounter: Secondary | ICD-10-CM | POA: Diagnosis not present

## 2018-06-08 DIAGNOSIS — E1140 Type 2 diabetes mellitus with diabetic neuropathy, unspecified: Secondary | ICD-10-CM | POA: Diagnosis not present

## 2018-06-08 DIAGNOSIS — T84012D Broken internal right knee prosthesis, subsequent encounter: Secondary | ICD-10-CM | POA: Diagnosis not present

## 2018-06-08 DIAGNOSIS — I130 Hypertensive heart and chronic kidney disease with heart failure and stage 1 through stage 4 chronic kidney disease, or unspecified chronic kidney disease: Secondary | ICD-10-CM | POA: Diagnosis not present

## 2018-06-08 DIAGNOSIS — I509 Heart failure, unspecified: Secondary | ICD-10-CM | POA: Diagnosis not present

## 2018-06-08 DIAGNOSIS — Z87891 Personal history of nicotine dependence: Secondary | ICD-10-CM | POA: Diagnosis not present

## 2018-06-08 DIAGNOSIS — E1122 Type 2 diabetes mellitus with diabetic chronic kidney disease: Secondary | ICD-10-CM | POA: Diagnosis not present

## 2018-06-08 DIAGNOSIS — E785 Hyperlipidemia, unspecified: Secondary | ICD-10-CM | POA: Diagnosis not present

## 2018-06-08 DIAGNOSIS — K219 Gastro-esophageal reflux disease without esophagitis: Secondary | ICD-10-CM | POA: Diagnosis not present

## 2018-06-08 DIAGNOSIS — G4733 Obstructive sleep apnea (adult) (pediatric): Secondary | ICD-10-CM | POA: Diagnosis not present

## 2018-06-08 DIAGNOSIS — N182 Chronic kidney disease, stage 2 (mild): Secondary | ICD-10-CM | POA: Diagnosis not present

## 2018-06-10 ENCOUNTER — Ambulatory Visit (INDEPENDENT_AMBULATORY_CARE_PROVIDER_SITE_OTHER): Payer: Medicare Other | Admitting: Physician Assistant

## 2018-06-10 ENCOUNTER — Encounter (INDEPENDENT_AMBULATORY_CARE_PROVIDER_SITE_OTHER): Payer: Self-pay | Admitting: Physician Assistant

## 2018-06-10 VITALS — Ht 63.0 in | Wt 337.4 lb

## 2018-06-10 DIAGNOSIS — E441 Mild protein-calorie malnutrition: Secondary | ICD-10-CM

## 2018-06-10 DIAGNOSIS — E1142 Type 2 diabetes mellitus with diabetic polyneuropathy: Secondary | ICD-10-CM

## 2018-06-10 DIAGNOSIS — Z96651 Presence of right artificial knee joint: Secondary | ICD-10-CM

## 2018-06-10 DIAGNOSIS — Z6841 Body Mass Index (BMI) 40.0 and over, adult: Secondary | ICD-10-CM

## 2018-06-10 MED ORDER — OXYCODONE-ACETAMINOPHEN 5-325 MG PO TABS: 1.0000 | ORAL_TABLET | Freq: Four times a day (QID) | ORAL | 0 refills | Status: DC | PRN

## 2018-06-11 ENCOUNTER — Encounter (INDEPENDENT_AMBULATORY_CARE_PROVIDER_SITE_OTHER): Payer: Self-pay | Admitting: Physician Assistant

## 2018-06-11 DIAGNOSIS — Z87891 Personal history of nicotine dependence: Secondary | ICD-10-CM | POA: Diagnosis not present

## 2018-06-11 DIAGNOSIS — G4733 Obstructive sleep apnea (adult) (pediatric): Secondary | ICD-10-CM | POA: Diagnosis not present

## 2018-06-11 DIAGNOSIS — E1140 Type 2 diabetes mellitus with diabetic neuropathy, unspecified: Secondary | ICD-10-CM | POA: Diagnosis not present

## 2018-06-11 DIAGNOSIS — E1122 Type 2 diabetes mellitus with diabetic chronic kidney disease: Secondary | ICD-10-CM | POA: Diagnosis not present

## 2018-06-11 DIAGNOSIS — I509 Heart failure, unspecified: Secondary | ICD-10-CM | POA: Diagnosis not present

## 2018-06-11 DIAGNOSIS — K219 Gastro-esophageal reflux disease without esophagitis: Secondary | ICD-10-CM | POA: Diagnosis not present

## 2018-06-11 DIAGNOSIS — E785 Hyperlipidemia, unspecified: Secondary | ICD-10-CM | POA: Diagnosis not present

## 2018-06-11 DIAGNOSIS — N182 Chronic kidney disease, stage 2 (mild): Secondary | ICD-10-CM | POA: Diagnosis not present

## 2018-06-11 DIAGNOSIS — T84012D Broken internal right knee prosthesis, subsequent encounter: Secondary | ICD-10-CM | POA: Diagnosis not present

## 2018-06-11 DIAGNOSIS — T84028D Dislocation of other internal joint prosthesis, subsequent encounter: Secondary | ICD-10-CM | POA: Diagnosis not present

## 2018-06-11 DIAGNOSIS — I130 Hypertensive heart and chronic kidney disease with heart failure and stage 1 through stage 4 chronic kidney disease, or unspecified chronic kidney disease: Secondary | ICD-10-CM | POA: Diagnosis not present

## 2018-06-11 NOTE — Progress Notes (Signed)
Office Visit Note   Patient: [name]           Date of Birth: 1966-04-03           MRN: [id number] Visit Date: 06/10/2018              Requested by: Care, [facility] [address] [place], [place] [address] PCP: Care, [facility]  Chief Complaint  Patient presents with  . Right Leg - Follow-up      HPI: Patient is a 52 year old female who presents for postoperative follow-up following a right total knee revision utilizing a hinged revision total knee replacement on 05/14/2018.  She is now approximately 4 weeks postop.  She is working with therapies and they are beginning some very gentle range of motion out of her knee immobilizer but otherwise she is staying in the knee immobilizer.  She states that she is continuing to have pain but it is getting better.  She does request a refill of pain medication.     Assessment & Plan: Visit Diagnoses:  1. Status post revision of total replacement of right knee   2. Morbid obesity ([place])   3. BMI 50.0-59.9, adult ([place])   4. Type 2 diabetes mellitus with diabetic polyneuropathy, unspecified whether long term insulin use ([place])   5. Mild protein-calorie malnutrition ([place])     Plan: Continue physical therapy with gentle but limited range of motion as previously outlined by Dr. Sharol Given.  Continue knee immobilizer.  Wash the leg daily and apply antibiotic ointment to superficial widening of the incision centrally.  Patient was prescribed Percocet 5/325 mg for pain control.  Follow-Up Instructions: Return in about 2 weeks (around 06/24/2018).   Ortho Exam  Patient is alert, oriented, no adenopathy, well-dressed, normal affect, normal respiratory effort. The right knee incision is clean dry and intact except for a small portion centrally which is widened slightly.  There are no signs of cellulitis.  She is in a knee immobilizer and we did not push range of motion beyond about 20 degrees  today.  Pulses are intact distally.  Imaging: No results found. No images are attached to the encounter.  Labs: Lab Results  Component Value Date   HGBA1C 7.1 (H) 05/14/2018   HGBA1C 11.3 (H) 01/03/2013   HGBA1C 8.6 (H) 03/05/2011   REPTSTATUS 04/16/2011 FINAL 04/10/2011   CULT NO GROWTH 5 DAYS 04/10/2011     Lab Results  Component Value Date   ALBUMIN 2.9 (L) 02/10/2018   ALBUMIN 2.5 (L) 11/10/2017   ALBUMIN 3.0 (L) 05/14/2014    Body mass index is 59.76 kg/m.  Orders:  No orders of the defined types were placed in this encounter.  Meds ordered this encounter  Medications  . oxyCODONE-acetaminophen (PERCOCET/ROXICET) 5-325 MG tablet    Sig: Take 1 tablet by mouth every 6 (six) hours as needed for moderate pain or severe pain.    Dispense:  28 tablet    Refill:  0     Procedures: No procedures performed  Clinical Data: No additional findings.  ROS:  All other systems negative, except as noted in the HPI. Review of Systems  Objective: Vital Signs: Ht 5\' 3"  (1.6 m)   Wt (!) 337 lb 5.9 oz (153 kg)   LMP 11/09/2012   BMI 59.76 kg/m   Specialty Comments:  No specialty comments available.  PMFS History: Patient Active Problem List   Diagnosis Date Noted  . S/P  revision of total knee, right 05/14/2018  . Dislocated knee, right, sequela   . S/P revision of total knee 02/17/2018  . Failed total right knee replacement ([place])   . Chronic renal insufficiency, stage 2 (mild) 11/10/2017  . Acute CHF (congestive heart failure) ([place]) 11/10/2017  . Failed total knee arthroplasty, sequela 08/31/2017  . Lower extremity cellulitis 01/03/2013  . Diabetes mellitus ([place]) 01/03/2013  . Morbid (severe) obesity due to excess calories ([place]) 11/12/2006  . TOBACCO DEPENDENCE 11/12/2006  . DEPRESSIVE DISORDER, NOS 11/12/2006  . HYPERTENSION, BENIGN SYSTEMIC 11/12/2006  . GASTROESOPHAGEAL REFLUX, NO ESOPHAGITIS 11/12/2006  . ACNE 11/12/2006  . OSTEOARTHRITIS, LOWER LEG  11/12/2006  . APNEA, SLEEP 11/12/2006   Past Medical History:  Diagnosis Date  . Arthritis    "hands, arms, feet, back, knees" (02/17/2018)  . Cardiomyopathy [facility])    From Dr. Bonney Roussel office notes from 01/13/18  . CHF (congestive heart failure) ([place])   . Chronic lower back pain   . Diabetic neuropathy ([place])   . Family history of adverse reaction to anesthesia    Mother is hard to arouse and blood pressure drops  . GERD (gastroesophageal reflux disease)   . High cholesterol   . Hypertension   . Sleep apnea    "need new machine"  - does not use a cpap (02/17/2018)  . Type II diabetes mellitus ([place])    no longer on medications (02/17/2018)    Family History  Problem Relation Age of Onset  . Kidney disease Mother   . Congestive Heart Failure Mother   . Hypertension Father     Past Surgical History:  Procedure Laterality Date  . ANTERIOR CERVICAL DECOMP/DISCECTOMY FUSION  03/06/2010   C4-7 w/corpectomy/notes 03/17/2010  . BACK SURGERY    . CARPAL TUNNEL RELEASE Right   . HARDWARE REVISION  04/10/2010   cervical hardware revision screw replacement C4/notes 04/12/2010  . JOINT REPLACEMENT    . LAPAROSCOPIC CHOLECYSTECTOMY  1999  . LUMBAR LAMINECTOMY/DECOMPRESSION MICRODISCECTOMY  09/2010   Archie Endo 10/05/2010  . MULTIPLE EXTRACTIONS WITH ALVEOLOPLASTY [name] 01/19/2015   Procedure: MULTIPLE EXTRACTIONS Three, Four, Five, Seven, eight, nine, ten, eleven, twelve, fourteen, eighteen, twenty-one, twenty-eight, twenty-nine, thirty-one with Alveoloplasty.  ;  Surgeon: Diona Browner, DDS;  Location: [facility];  Service: Oral Surgery;  Laterality: [name];  . REPLACEMENT TOTAL KNEE BILATERAL Bilateral 10/2007-03/2008   "left-right"  . TOENAIL EXCISION Bilateral    great toes  . TONSILLECTOMY    . TOTAL KNEE REVISION Right 02/17/2018  . TOTAL KNEE REVISION Right 02/17/2018   Procedure: RIGHT TOTAL KNEE REVISION;  Surgeon: Newt Minion, MD;  Location: [facility];  Service: Orthopedics;  Laterality: Right;  .  TOTAL KNEE REVISION Right 05/14/2018   Procedure: RIGHT TOTAL KNEE REVISION TO HINGED KNEE;  Surgeon: Newt Minion, MD;  Location: [facility];  Service: Orthopedics;  Laterality: Right;   Social History   Occupational History  . Not on file  Tobacco Use  . Smoking status: Current Every Day Smoker    Packs/day: 0.50    Years: 32.00    Pack years: 16.00    Types: Cigarettes  . Smokeless tobacco: Never Used  Substance and Sexual Activity  . Alcohol use: Not Currently  . Drug use: Not Currently    Types: Marijuana  . Sexual activity: Not Currently

## 2018-06-15 DIAGNOSIS — E1140 Type 2 diabetes mellitus with diabetic neuropathy, unspecified: Secondary | ICD-10-CM | POA: Diagnosis not present

## 2018-06-15 DIAGNOSIS — I130 Hypertensive heart and chronic kidney disease with heart failure and stage 1 through stage 4 chronic kidney disease, or unspecified chronic kidney disease: Secondary | ICD-10-CM | POA: Diagnosis not present

## 2018-06-15 DIAGNOSIS — T84012D Broken internal right knee prosthesis, subsequent encounter: Secondary | ICD-10-CM | POA: Diagnosis not present

## 2018-06-15 DIAGNOSIS — E1122 Type 2 diabetes mellitus with diabetic chronic kidney disease: Secondary | ICD-10-CM | POA: Diagnosis not present

## 2018-06-15 DIAGNOSIS — G4733 Obstructive sleep apnea (adult) (pediatric): Secondary | ICD-10-CM | POA: Diagnosis not present

## 2018-06-15 DIAGNOSIS — K219 Gastro-esophageal reflux disease without esophagitis: Secondary | ICD-10-CM | POA: Diagnosis not present

## 2018-06-15 DIAGNOSIS — T84028D Dislocation of other internal joint prosthesis, subsequent encounter: Secondary | ICD-10-CM | POA: Diagnosis not present

## 2018-06-15 DIAGNOSIS — E785 Hyperlipidemia, unspecified: Secondary | ICD-10-CM | POA: Diagnosis not present

## 2018-06-15 DIAGNOSIS — I509 Heart failure, unspecified: Secondary | ICD-10-CM | POA: Diagnosis not present

## 2018-06-15 DIAGNOSIS — N182 Chronic kidney disease, stage 2 (mild): Secondary | ICD-10-CM | POA: Diagnosis not present

## 2018-06-15 DIAGNOSIS — Z87891 Personal history of nicotine dependence: Secondary | ICD-10-CM | POA: Diagnosis not present

## 2018-06-16 ENCOUNTER — Telehealth (INDEPENDENT_AMBULATORY_CARE_PROVIDER_SITE_OTHER): Payer: Self-pay | Admitting: Orthopedic Surgery

## 2018-06-16 DIAGNOSIS — M1710 Unilateral primary osteoarthritis, unspecified knee: Secondary | ICD-10-CM | POA: Diagnosis not present

## 2018-06-16 DIAGNOSIS — S83104A Unspecified dislocation of right knee, initial encounter: Secondary | ICD-10-CM | POA: Diagnosis not present

## 2018-06-16 NOTE — Telephone Encounter (Signed)
Patient requesting rx refill on percocet.   Patients # [phone]

## 2018-06-16 NOTE — Telephone Encounter (Signed)
Pt is s/p a total knee revision 05/14/18. She has had #120 oxycodone 10/325 last month and #28 oxycodone 5/325 on 06/10/18 pt is requesting refill please advise.

## 2018-06-16 NOTE — Telephone Encounter (Signed)
Too soon for refill.  If patient is having this much pain we need to see her in the office.

## 2018-06-17 DIAGNOSIS — I509 Heart failure, unspecified: Secondary | ICD-10-CM | POA: Diagnosis not present

## 2018-06-17 DIAGNOSIS — Z87891 Personal history of nicotine dependence: Secondary | ICD-10-CM | POA: Diagnosis not present

## 2018-06-17 DIAGNOSIS — N182 Chronic kidney disease, stage 2 (mild): Secondary | ICD-10-CM | POA: Diagnosis not present

## 2018-06-17 DIAGNOSIS — T84012D Broken internal right knee prosthesis, subsequent encounter: Secondary | ICD-10-CM | POA: Diagnosis not present

## 2018-06-17 DIAGNOSIS — K219 Gastro-esophageal reflux disease without esophagitis: Secondary | ICD-10-CM | POA: Diagnosis not present

## 2018-06-17 DIAGNOSIS — E1122 Type 2 diabetes mellitus with diabetic chronic kidney disease: Secondary | ICD-10-CM | POA: Diagnosis not present

## 2018-06-17 DIAGNOSIS — G4733 Obstructive sleep apnea (adult) (pediatric): Secondary | ICD-10-CM | POA: Diagnosis not present

## 2018-06-17 DIAGNOSIS — E1140 Type 2 diabetes mellitus with diabetic neuropathy, unspecified: Secondary | ICD-10-CM | POA: Diagnosis not present

## 2018-06-17 DIAGNOSIS — E785 Hyperlipidemia, unspecified: Secondary | ICD-10-CM | POA: Diagnosis not present

## 2018-06-17 DIAGNOSIS — T84028D Dislocation of other internal joint prosthesis, subsequent encounter: Secondary | ICD-10-CM | POA: Diagnosis not present

## 2018-06-17 DIAGNOSIS — I130 Hypertensive heart and chronic kidney disease with heart failure and stage 1 through stage 4 chronic kidney disease, or unspecified chronic kidney disease: Secondary | ICD-10-CM | POA: Diagnosis not present

## 2018-06-17 NOTE — Telephone Encounter (Signed)
Please call.

## 2018-06-17 NOTE — Telephone Encounter (Signed)
Patient understood verbal orders of not refilling Rx. Will be in on 06/25/18.

## 2018-06-18 ENCOUNTER — Ambulatory Visit (INDEPENDENT_AMBULATORY_CARE_PROVIDER_SITE_OTHER): Payer: Medicare Other | Admitting: Physician Assistant

## 2018-06-19 DIAGNOSIS — G894 Chronic pain syndrome: Secondary | ICD-10-CM | POA: Diagnosis not present

## 2018-06-19 DIAGNOSIS — G802 Spastic hemiplegic cerebral palsy: Secondary | ICD-10-CM | POA: Diagnosis not present

## 2018-06-21 ENCOUNTER — Ambulatory Visit (INDEPENDENT_AMBULATORY_CARE_PROVIDER_SITE_OTHER): Payer: Medicare Other | Admitting: Physician Assistant

## 2018-06-22 DIAGNOSIS — G4733 Obstructive sleep apnea (adult) (pediatric): Secondary | ICD-10-CM | POA: Diagnosis not present

## 2018-06-22 DIAGNOSIS — Z87891 Personal history of nicotine dependence: Secondary | ICD-10-CM | POA: Diagnosis not present

## 2018-06-22 DIAGNOSIS — I509 Heart failure, unspecified: Secondary | ICD-10-CM | POA: Diagnosis not present

## 2018-06-22 DIAGNOSIS — K219 Gastro-esophageal reflux disease without esophagitis: Secondary | ICD-10-CM | POA: Diagnosis not present

## 2018-06-22 DIAGNOSIS — T84028D Dislocation of other internal joint prosthesis, subsequent encounter: Secondary | ICD-10-CM | POA: Diagnosis not present

## 2018-06-22 DIAGNOSIS — E1140 Type 2 diabetes mellitus with diabetic neuropathy, unspecified: Secondary | ICD-10-CM | POA: Diagnosis not present

## 2018-06-22 DIAGNOSIS — N182 Chronic kidney disease, stage 2 (mild): Secondary | ICD-10-CM | POA: Diagnosis not present

## 2018-06-22 DIAGNOSIS — E785 Hyperlipidemia, unspecified: Secondary | ICD-10-CM | POA: Diagnosis not present

## 2018-06-22 DIAGNOSIS — E1122 Type 2 diabetes mellitus with diabetic chronic kidney disease: Secondary | ICD-10-CM | POA: Diagnosis not present

## 2018-06-22 DIAGNOSIS — T84012D Broken internal right knee prosthesis, subsequent encounter: Secondary | ICD-10-CM | POA: Diagnosis not present

## 2018-06-22 DIAGNOSIS — I130 Hypertensive heart and chronic kidney disease with heart failure and stage 1 through stage 4 chronic kidney disease, or unspecified chronic kidney disease: Secondary | ICD-10-CM | POA: Diagnosis not present

## 2018-06-23 DIAGNOSIS — M25561 Pain in right knee: Secondary | ICD-10-CM | POA: Diagnosis not present

## 2018-06-23 DIAGNOSIS — M25512 Pain in left shoulder: Secondary | ICD-10-CM | POA: Diagnosis not present

## 2018-06-23 DIAGNOSIS — D1730 Benign lipomatous neoplasm of skin and subcutaneous tissue of unspecified sites: Secondary | ICD-10-CM | POA: Diagnosis not present

## 2018-06-24 DIAGNOSIS — G4733 Obstructive sleep apnea (adult) (pediatric): Secondary | ICD-10-CM | POA: Diagnosis not present

## 2018-06-24 DIAGNOSIS — I509 Heart failure, unspecified: Secondary | ICD-10-CM | POA: Diagnosis not present

## 2018-06-24 DIAGNOSIS — T84012D Broken internal right knee prosthesis, subsequent encounter: Secondary | ICD-10-CM | POA: Diagnosis not present

## 2018-06-24 DIAGNOSIS — E785 Hyperlipidemia, unspecified: Secondary | ICD-10-CM | POA: Diagnosis not present

## 2018-06-24 DIAGNOSIS — E1140 Type 2 diabetes mellitus with diabetic neuropathy, unspecified: Secondary | ICD-10-CM | POA: Diagnosis not present

## 2018-06-24 DIAGNOSIS — E1122 Type 2 diabetes mellitus with diabetic chronic kidney disease: Secondary | ICD-10-CM | POA: Diagnosis not present

## 2018-06-24 DIAGNOSIS — Z87891 Personal history of nicotine dependence: Secondary | ICD-10-CM | POA: Diagnosis not present

## 2018-06-24 DIAGNOSIS — T84028D Dislocation of other internal joint prosthesis, subsequent encounter: Secondary | ICD-10-CM | POA: Diagnosis not present

## 2018-06-24 DIAGNOSIS — I130 Hypertensive heart and chronic kidney disease with heart failure and stage 1 through stage 4 chronic kidney disease, or unspecified chronic kidney disease: Secondary | ICD-10-CM | POA: Diagnosis not present

## 2018-06-24 DIAGNOSIS — K219 Gastro-esophageal reflux disease without esophagitis: Secondary | ICD-10-CM | POA: Diagnosis not present

## 2018-06-24 DIAGNOSIS — N182 Chronic kidney disease, stage 2 (mild): Secondary | ICD-10-CM | POA: Diagnosis not present

## 2018-06-25 ENCOUNTER — Ambulatory Visit (INDEPENDENT_AMBULATORY_CARE_PROVIDER_SITE_OTHER): Payer: Medicare Other | Admitting: Physician Assistant

## 2018-06-25 ENCOUNTER — Encounter (INDEPENDENT_AMBULATORY_CARE_PROVIDER_SITE_OTHER): Payer: Self-pay | Admitting: Physician Assistant

## 2018-06-25 ENCOUNTER — Ambulatory Visit (INDEPENDENT_AMBULATORY_CARE_PROVIDER_SITE_OTHER): Payer: Self-pay

## 2018-06-25 VITALS — Ht 63.0 in | Wt 337.0 lb

## 2018-06-25 DIAGNOSIS — E1142 Type 2 diabetes mellitus with diabetic polyneuropathy: Secondary | ICD-10-CM

## 2018-06-25 DIAGNOSIS — M7542 Impingement syndrome of left shoulder: Secondary | ICD-10-CM

## 2018-06-25 DIAGNOSIS — Z96651 Presence of right artificial knee joint: Secondary | ICD-10-CM | POA: Diagnosis not present

## 2018-06-25 DIAGNOSIS — Z6841 Body Mass Index (BMI) 40.0 and over, adult: Secondary | ICD-10-CM

## 2018-06-25 MED ORDER — LIDOCAINE HCL 1 % IJ SOLN
5.0000 mL | INTRAMUSCULAR | Status: AC | PRN
  Administered 2018-06-25: 5 mL

## 2018-06-25 MED ORDER — OXYCODONE-ACETAMINOPHEN 10-325 MG PO TABS: 1.0000 | ORAL_TABLET | Freq: Four times a day (QID) | ORAL | 0 refills | Status: DC | PRN

## 2018-06-25 MED ORDER — METHYLPREDNISOLONE ACETATE 40 MG/ML IJ SUSP
40.0000 mg | INTRAMUSCULAR | Status: AC | PRN
  Administered 2018-06-25: 40 mg via INTRA_ARTICULAR

## 2018-06-25 NOTE — Progress Notes (Signed)
Office Visit Note   Patient: [name]           Date of Birth: 12/26/1965           MRN: [id number] Visit Date: 06/25/2018              Requested by: Care, [facility] [address] [place], [place] [address] PCP: Care, [facility]  Chief Complaint  Patient presents with  . Right Knee - Follow-up    05/14/18 right total knee revision       HPI: Patient is a 52 year old female who presents for postoperative follow-up following a right total knee revision utilizing a hinged revision total knee replacement on 05/14/2018.  She is approximately 6 weeks postop.  She is wearing her knee immobilizer and maintaining nonweightbearing for the most part.  She reports she is developed some pain in her left shoulder and felt it was due to a lipoma or other mass that is developed over the shoulder area.  Assessment & Plan: Visit Diagnoses:  1. Status post revision of total replacement of right knee   2. Impingement syndrome of left shoulder   3. BMI 50.0-59.9, adult ([place])   4. Type 2 diabetes mellitus with diabetic polyneuropathy, unspecified whether long term insulin use ([place])   5. Morbid obesity ([place])     Plan: The shoulder was injected with Depo-Medrol and lidocaine under sterile techniques and the patient tolerated this well.  She can begin weightbearing as tolerated in her knee immobilizer and continue with range of motion of the right knee with physical therapy.  Orders were given to physical therapy.  She was given a refill prescription for Percocet 10/325 mg #28. She will  follow-up in 3 weeks.  Follow-Up Instructions: Return in about 3 weeks (around 07/16/2018).   Ortho Exam  Patient is alert, oriented, no adenopathy, well-dressed, normal affect, normal respiratory effort. Right knee incision is clean dry and intact and healing well.  Patient's edema is overall improved.  She has full extension.  No signs of cellulitis. Left  shoulder with pain with internal rotation and ABduction. Tender to palpation over a mass/lipoma over the lateral scapular border. Neurovascular intact in the left upper extremity.  Imaging: Xr Knee 1-2 Views Right  Result Date: 06/25/2018 Reviewed with Dr. Sharol Given- Good position and alignment of TKA components.  No images are attached to the encounter.  Labs: Lab Results  Component Value Date   HGBA1C 7.1 (H) 05/14/2018   HGBA1C 11.3 (H) 01/03/2013   HGBA1C 8.6 (H) 03/05/2011   REPTSTATUS 04/16/2011 FINAL 04/10/2011   CULT NO GROWTH 5 DAYS 04/10/2011     Lab Results  Component Value Date   ALBUMIN 2.9 (L) 02/10/2018   ALBUMIN 2.5 (L) 11/10/2017   ALBUMIN 3.0 (L) 05/14/2014    Body mass index is 59.7 kg/m.  Orders:  Orders Placed This Encounter  Procedures  . Large Joint Inj: L subacromial bursa  . XR Knee 1-2 Views Right   Meds ordered this encounter  Medications  . oxyCODONE-acetaminophen (PERCOCET) 10-325 MG tablet    Sig: Take 1 tablet by mouth every 6 (six) hours as needed for pain.    Dispense:  28 tablet    Refill:  0     Procedures: Large Joint Inj: L subacromial bursa on 06/25/2018 2:30 PM Indications: diagnostic evaluation and pain Details: 22 G 1.5 in needle, posterior approach  Arthrogram: No  Medications: 5 mL lidocaine 1 %;  40 mg methylPREDNISolone acetate 40 MG/ML Outcome: tolerated well, no immediate complications Procedure, treatment alternatives, risks and benefits explained, specific risks discussed. Consent was given by the patient. Immediately prior to procedure a time out was called to verify the correct patient, procedure, equipment, support staff and site/side marked as required. Patient was prepped and draped in the usual sterile fashion.      Clinical Data: No additional findings.  ROS:  All other systems negative, except as noted in the HPI. Review of Systems  Objective: Vital Signs: Ht 5\' 3"  (1.6 m)   Wt (!) 337 lb (152.9 kg)    LMP 11/09/2012   BMI 59.70 kg/m   Specialty Comments:  No specialty comments available.  PMFS History: Patient Active Problem List   Diagnosis Date Noted  . S/P revision of total knee, right 05/14/2018  . Dislocated knee, right, sequela   . S/P revision of total knee 02/17/2018  . Failed total right knee replacement ([place])   . Chronic renal insufficiency, stage 2 (mild) 11/10/2017  . Acute CHF (congestive heart failure) ([place]) 11/10/2017  . Failed total knee arthroplasty, sequela 08/31/2017  . Lower extremity cellulitis 01/03/2013  . Diabetes mellitus ([place]) 01/03/2013  . Morbid (severe) obesity due to excess calories ([place]) 11/12/2006  . TOBACCO DEPENDENCE 11/12/2006  . DEPRESSIVE DISORDER, NOS 11/12/2006  . HYPERTENSION, BENIGN SYSTEMIC 11/12/2006  . GASTROESOPHAGEAL REFLUX, NO ESOPHAGITIS 11/12/2006  . ACNE 11/12/2006  . OSTEOARTHRITIS, LOWER LEG 11/12/2006  . APNEA, SLEEP 11/12/2006   Past Medical History:  Diagnosis Date  . Arthritis    "hands, arms, feet, back, knees" (02/17/2018)  . Cardiomyopathy [facility])    From Dr. Bonney Roussel office notes from 01/13/18  . CHF (congestive heart failure) ([place])   . Chronic lower back pain   . Diabetic neuropathy ([place])   . Family history of adverse reaction to anesthesia    Mother is hard to arouse and blood pressure drops  . GERD (gastroesophageal reflux disease)   . High cholesterol   . Hypertension   . Sleep apnea    "need new machine"  - does not use a cpap (02/17/2018)  . Type II diabetes mellitus ([place])    no longer on medications (02/17/2018)    Family History  Problem Relation Age of Onset  . Kidney disease Mother   . Congestive Heart Failure Mother   . Hypertension Father     Past Surgical History:  Procedure Laterality Date  . ANTERIOR CERVICAL DECOMP/DISCECTOMY FUSION  03/06/2010   C4-7 w/corpectomy/notes 03/17/2010  . BACK SURGERY    . CARPAL TUNNEL RELEASE Right   . HARDWARE REVISION  04/10/2010   cervical hardware  revision screw replacement C4/notes 04/12/2010  . JOINT REPLACEMENT    . LAPAROSCOPIC CHOLECYSTECTOMY  1999  . LUMBAR LAMINECTOMY/DECOMPRESSION MICRODISCECTOMY  09/2010   Archie Endo 10/05/2010  . MULTIPLE EXTRACTIONS WITH ALVEOLOPLASTY [name] 01/19/2015   Procedure: MULTIPLE EXTRACTIONS Three, Four, Five, Seven, eight, nine, ten, eleven, twelve, fourteen, eighteen, twenty-one, twenty-eight, twenty-nine, thirty-one with Alveoloplasty.  ;  Surgeon: Diona Browner, DDS;  Location: [facility];  Service: Oral Surgery;  Laterality: [name];  . REPLACEMENT TOTAL KNEE BILATERAL Bilateral 10/2007-03/2008   "left-right"  . TOENAIL EXCISION Bilateral    great toes  . TONSILLECTOMY    . TOTAL KNEE REVISION Right 02/17/2018  . TOTAL KNEE REVISION Right 02/17/2018   Procedure: RIGHT TOTAL KNEE REVISION;  Surgeon: Newt Minion, MD;  Location: [facility];  Service: Orthopedics;  Laterality: Right;  . TOTAL KNEE  REVISION Right 05/14/2018   Procedure: RIGHT TOTAL KNEE REVISION TO HINGED KNEE;  Surgeon: Newt Minion, MD;  Location: [facility];  Service: Orthopedics;  Laterality: Right;   Social History   Occupational History  . Not on file  Tobacco Use  . Smoking status: Current Every Day Smoker    Packs/day: 0.50    Years: 32.00    Pack years: 16.00    Types: Cigarettes  . Smokeless tobacco: Never Used  Substance and Sexual Activity  . Alcohol use: Not Currently  . Drug use: Not Currently    Types: Marijuana  . Sexual activity: Not Currently

## 2018-06-29 DIAGNOSIS — I130 Hypertensive heart and chronic kidney disease with heart failure and stage 1 through stage 4 chronic kidney disease, or unspecified chronic kidney disease: Secondary | ICD-10-CM | POA: Diagnosis not present

## 2018-06-29 DIAGNOSIS — G4733 Obstructive sleep apnea (adult) (pediatric): Secondary | ICD-10-CM | POA: Diagnosis not present

## 2018-06-29 DIAGNOSIS — E1140 Type 2 diabetes mellitus with diabetic neuropathy, unspecified: Secondary | ICD-10-CM | POA: Diagnosis not present

## 2018-06-29 DIAGNOSIS — K219 Gastro-esophageal reflux disease without esophagitis: Secondary | ICD-10-CM | POA: Diagnosis not present

## 2018-06-29 DIAGNOSIS — I509 Heart failure, unspecified: Secondary | ICD-10-CM | POA: Diagnosis not present

## 2018-06-29 DIAGNOSIS — N182 Chronic kidney disease, stage 2 (mild): Secondary | ICD-10-CM | POA: Diagnosis not present

## 2018-06-29 DIAGNOSIS — E785 Hyperlipidemia, unspecified: Secondary | ICD-10-CM | POA: Diagnosis not present

## 2018-06-29 DIAGNOSIS — E1122 Type 2 diabetes mellitus with diabetic chronic kidney disease: Secondary | ICD-10-CM | POA: Diagnosis not present

## 2018-06-29 DIAGNOSIS — T84012D Broken internal right knee prosthesis, subsequent encounter: Secondary | ICD-10-CM | POA: Diagnosis not present

## 2018-06-29 DIAGNOSIS — Z87891 Personal history of nicotine dependence: Secondary | ICD-10-CM | POA: Diagnosis not present

## 2018-06-29 DIAGNOSIS — T84028D Dislocation of other internal joint prosthesis, subsequent encounter: Secondary | ICD-10-CM | POA: Diagnosis not present

## 2018-07-01 DIAGNOSIS — E1122 Type 2 diabetes mellitus with diabetic chronic kidney disease: Secondary | ICD-10-CM | POA: Diagnosis not present

## 2018-07-01 DIAGNOSIS — I509 Heart failure, unspecified: Secondary | ICD-10-CM | POA: Diagnosis not present

## 2018-07-01 DIAGNOSIS — K219 Gastro-esophageal reflux disease without esophagitis: Secondary | ICD-10-CM | POA: Diagnosis not present

## 2018-07-01 DIAGNOSIS — E1140 Type 2 diabetes mellitus with diabetic neuropathy, unspecified: Secondary | ICD-10-CM | POA: Diagnosis not present

## 2018-07-01 DIAGNOSIS — T84012D Broken internal right knee prosthesis, subsequent encounter: Secondary | ICD-10-CM | POA: Diagnosis not present

## 2018-07-01 DIAGNOSIS — T84028D Dislocation of other internal joint prosthesis, subsequent encounter: Secondary | ICD-10-CM | POA: Diagnosis not present

## 2018-07-01 DIAGNOSIS — G4733 Obstructive sleep apnea (adult) (pediatric): Secondary | ICD-10-CM | POA: Diagnosis not present

## 2018-07-01 DIAGNOSIS — Z87891 Personal history of nicotine dependence: Secondary | ICD-10-CM | POA: Diagnosis not present

## 2018-07-01 DIAGNOSIS — E785 Hyperlipidemia, unspecified: Secondary | ICD-10-CM | POA: Diagnosis not present

## 2018-07-01 DIAGNOSIS — N182 Chronic kidney disease, stage 2 (mild): Secondary | ICD-10-CM | POA: Diagnosis not present

## 2018-07-01 DIAGNOSIS — I130 Hypertensive heart and chronic kidney disease with heart failure and stage 1 through stage 4 chronic kidney disease, or unspecified chronic kidney disease: Secondary | ICD-10-CM | POA: Diagnosis not present

## 2018-07-02 ENCOUNTER — Telehealth (INDEPENDENT_AMBULATORY_CARE_PROVIDER_SITE_OTHER): Payer: Self-pay | Admitting: Orthopedic Surgery

## 2018-07-02 NOTE — Telephone Encounter (Signed)
Patient called requesting an RX refill on her Oxycodone.  CB#[phone].  Thank you

## 2018-07-05 ENCOUNTER — Other Ambulatory Visit (INDEPENDENT_AMBULATORY_CARE_PROVIDER_SITE_OTHER): Payer: Self-pay | Admitting: Physician Assistant

## 2018-07-05 MED ORDER — OXYCODONE-ACETAMINOPHEN 10-325 MG PO TABS: 1.0000 | ORAL_TABLET | Freq: Four times a day (QID) | ORAL | 0 refills | Status: DC | PRN

## 2018-07-05 NOTE — Telephone Encounter (Signed)
Please advise, Last Rx 06/25/18 Qty 28 for 7days

## 2018-07-05 NOTE — Telephone Encounter (Signed)
Pt was called.

## 2018-07-05 NOTE — Telephone Encounter (Signed)
Prescription refilled and left for patient to pick up.

## 2018-07-06 DIAGNOSIS — I130 Hypertensive heart and chronic kidney disease with heart failure and stage 1 through stage 4 chronic kidney disease, or unspecified chronic kidney disease: Secondary | ICD-10-CM | POA: Diagnosis not present

## 2018-07-06 DIAGNOSIS — Z87891 Personal history of nicotine dependence: Secondary | ICD-10-CM | POA: Diagnosis not present

## 2018-07-06 DIAGNOSIS — E1122 Type 2 diabetes mellitus with diabetic chronic kidney disease: Secondary | ICD-10-CM | POA: Diagnosis not present

## 2018-07-06 DIAGNOSIS — K219 Gastro-esophageal reflux disease without esophagitis: Secondary | ICD-10-CM | POA: Diagnosis not present

## 2018-07-06 DIAGNOSIS — I509 Heart failure, unspecified: Secondary | ICD-10-CM | POA: Diagnosis not present

## 2018-07-06 DIAGNOSIS — T84028D Dislocation of other internal joint prosthesis, subsequent encounter: Secondary | ICD-10-CM | POA: Diagnosis not present

## 2018-07-06 DIAGNOSIS — T84012D Broken internal right knee prosthesis, subsequent encounter: Secondary | ICD-10-CM | POA: Diagnosis not present

## 2018-07-06 DIAGNOSIS — G4733 Obstructive sleep apnea (adult) (pediatric): Secondary | ICD-10-CM | POA: Diagnosis not present

## 2018-07-06 DIAGNOSIS — E1140 Type 2 diabetes mellitus with diabetic neuropathy, unspecified: Secondary | ICD-10-CM | POA: Diagnosis not present

## 2018-07-06 DIAGNOSIS — N182 Chronic kidney disease, stage 2 (mild): Secondary | ICD-10-CM | POA: Diagnosis not present

## 2018-07-06 DIAGNOSIS — E785 Hyperlipidemia, unspecified: Secondary | ICD-10-CM | POA: Diagnosis not present

## 2018-07-08 DIAGNOSIS — T84028D Dislocation of other internal joint prosthesis, subsequent encounter: Secondary | ICD-10-CM | POA: Diagnosis not present

## 2018-07-08 DIAGNOSIS — E785 Hyperlipidemia, unspecified: Secondary | ICD-10-CM | POA: Diagnosis not present

## 2018-07-08 DIAGNOSIS — N182 Chronic kidney disease, stage 2 (mild): Secondary | ICD-10-CM | POA: Diagnosis not present

## 2018-07-08 DIAGNOSIS — I509 Heart failure, unspecified: Secondary | ICD-10-CM | POA: Diagnosis not present

## 2018-07-08 DIAGNOSIS — E1122 Type 2 diabetes mellitus with diabetic chronic kidney disease: Secondary | ICD-10-CM | POA: Diagnosis not present

## 2018-07-08 DIAGNOSIS — E1140 Type 2 diabetes mellitus with diabetic neuropathy, unspecified: Secondary | ICD-10-CM | POA: Diagnosis not present

## 2018-07-08 DIAGNOSIS — T84012D Broken internal right knee prosthesis, subsequent encounter: Secondary | ICD-10-CM | POA: Diagnosis not present

## 2018-07-08 DIAGNOSIS — K219 Gastro-esophageal reflux disease without esophagitis: Secondary | ICD-10-CM | POA: Diagnosis not present

## 2018-07-08 DIAGNOSIS — G4733 Obstructive sleep apnea (adult) (pediatric): Secondary | ICD-10-CM | POA: Diagnosis not present

## 2018-07-08 DIAGNOSIS — I130 Hypertensive heart and chronic kidney disease with heart failure and stage 1 through stage 4 chronic kidney disease, or unspecified chronic kidney disease: Secondary | ICD-10-CM | POA: Diagnosis not present

## 2018-07-08 DIAGNOSIS — Z87891 Personal history of nicotine dependence: Secondary | ICD-10-CM | POA: Diagnosis not present

## 2018-07-09 ENCOUNTER — Telehealth (INDEPENDENT_AMBULATORY_CARE_PROVIDER_SITE_OTHER): Payer: Self-pay | Admitting: Orthopedic Surgery

## 2018-07-09 NOTE — Telephone Encounter (Signed)
Patient requesting a refill on oxycodone 10-325 mg. She said her pharmacy will not request for her. Patients # [phone]

## 2018-07-12 NOTE — Telephone Encounter (Signed)
Dr. Sharol Given said it is too soon for a refill.

## 2018-07-12 NOTE — Telephone Encounter (Signed)
Please advise; last Rx given on 07/05/18 with Qty of 28 for 7 days. Oxycodone 10-325mg 

## 2018-07-13 NOTE — Telephone Encounter (Signed)
Thanks

## 2018-07-14 ENCOUNTER — Telehealth (INDEPENDENT_AMBULATORY_CARE_PROVIDER_SITE_OTHER): Payer: Self-pay | Admitting: Orthopedic Surgery

## 2018-07-14 DIAGNOSIS — T84028D Dislocation of other internal joint prosthesis, subsequent encounter: Secondary | ICD-10-CM | POA: Diagnosis not present

## 2018-07-14 DIAGNOSIS — K219 Gastro-esophageal reflux disease without esophagitis: Secondary | ICD-10-CM | POA: Diagnosis not present

## 2018-07-14 DIAGNOSIS — G4733 Obstructive sleep apnea (adult) (pediatric): Secondary | ICD-10-CM | POA: Diagnosis not present

## 2018-07-14 DIAGNOSIS — Z87891 Personal history of nicotine dependence: Secondary | ICD-10-CM | POA: Diagnosis not present

## 2018-07-14 DIAGNOSIS — E1122 Type 2 diabetes mellitus with diabetic chronic kidney disease: Secondary | ICD-10-CM | POA: Diagnosis not present

## 2018-07-14 DIAGNOSIS — N182 Chronic kidney disease, stage 2 (mild): Secondary | ICD-10-CM | POA: Diagnosis not present

## 2018-07-14 DIAGNOSIS — T84012D Broken internal right knee prosthesis, subsequent encounter: Secondary | ICD-10-CM | POA: Diagnosis not present

## 2018-07-14 DIAGNOSIS — E785 Hyperlipidemia, unspecified: Secondary | ICD-10-CM | POA: Diagnosis not present

## 2018-07-14 DIAGNOSIS — I509 Heart failure, unspecified: Secondary | ICD-10-CM | POA: Diagnosis not present

## 2018-07-14 DIAGNOSIS — E1140 Type 2 diabetes mellitus with diabetic neuropathy, unspecified: Secondary | ICD-10-CM | POA: Diagnosis not present

## 2018-07-14 DIAGNOSIS — I130 Hypertensive heart and chronic kidney disease with heart failure and stage 1 through stage 4 chronic kidney disease, or unspecified chronic kidney disease: Secondary | ICD-10-CM | POA: Diagnosis not present

## 2018-07-14 NOTE — Telephone Encounter (Signed)
Called and lm on vm to advise verbal ok for orders below s/p total knee revision

## 2018-07-14 NOTE — Telephone Encounter (Signed)
Eril, PT [facility] is requesting an extension on physical therapy orders  2 x for 2 weeks  CB # [phone]

## 2018-07-15 ENCOUNTER — Ambulatory Visit (INDEPENDENT_AMBULATORY_CARE_PROVIDER_SITE_OTHER): Payer: Medicare Other | Admitting: Orthopedic Surgery

## 2018-07-15 ENCOUNTER — Encounter (INDEPENDENT_AMBULATORY_CARE_PROVIDER_SITE_OTHER): Payer: Self-pay | Admitting: Physician Assistant

## 2018-07-15 VITALS — Ht 63.0 in | Wt 337.0 lb

## 2018-07-15 DIAGNOSIS — E43 Unspecified severe protein-calorie malnutrition: Secondary | ICD-10-CM

## 2018-07-15 DIAGNOSIS — Z6841 Body Mass Index (BMI) 40.0 and over, adult: Secondary | ICD-10-CM

## 2018-07-15 DIAGNOSIS — Z96651 Presence of right artificial knee joint: Secondary | ICD-10-CM | POA: Diagnosis not present

## 2018-07-15 MED ORDER — OXYCODONE-ACETAMINOPHEN 10-325 MG PO TABS: 1.0000 | ORAL_TABLET | Freq: Four times a day (QID) | ORAL | 0 refills | Status: DC | PRN

## 2018-07-16 DIAGNOSIS — T84012D Broken internal right knee prosthesis, subsequent encounter: Secondary | ICD-10-CM | POA: Diagnosis not present

## 2018-07-16 DIAGNOSIS — E1140 Type 2 diabetes mellitus with diabetic neuropathy, unspecified: Secondary | ICD-10-CM | POA: Diagnosis not present

## 2018-07-16 DIAGNOSIS — E785 Hyperlipidemia, unspecified: Secondary | ICD-10-CM | POA: Diagnosis not present

## 2018-07-16 DIAGNOSIS — N182 Chronic kidney disease, stage 2 (mild): Secondary | ICD-10-CM | POA: Diagnosis not present

## 2018-07-16 DIAGNOSIS — I509 Heart failure, unspecified: Secondary | ICD-10-CM | POA: Diagnosis not present

## 2018-07-16 DIAGNOSIS — G4733 Obstructive sleep apnea (adult) (pediatric): Secondary | ICD-10-CM | POA: Diagnosis not present

## 2018-07-16 DIAGNOSIS — K219 Gastro-esophageal reflux disease without esophagitis: Secondary | ICD-10-CM | POA: Diagnosis not present

## 2018-07-16 DIAGNOSIS — E1122 Type 2 diabetes mellitus with diabetic chronic kidney disease: Secondary | ICD-10-CM | POA: Diagnosis not present

## 2018-07-16 DIAGNOSIS — T84028D Dislocation of other internal joint prosthesis, subsequent encounter: Secondary | ICD-10-CM | POA: Diagnosis not present

## 2018-07-16 DIAGNOSIS — I130 Hypertensive heart and chronic kidney disease with heart failure and stage 1 through stage 4 chronic kidney disease, or unspecified chronic kidney disease: Secondary | ICD-10-CM | POA: Diagnosis not present

## 2018-07-16 DIAGNOSIS — Z87891 Personal history of nicotine dependence: Secondary | ICD-10-CM | POA: Diagnosis not present

## 2018-07-17 DIAGNOSIS — S83104A Unspecified dislocation of right knee, initial encounter: Secondary | ICD-10-CM | POA: Diagnosis not present

## 2018-07-17 DIAGNOSIS — M1710 Unilateral primary osteoarthritis, unspecified knee: Secondary | ICD-10-CM | POA: Diagnosis not present

## 2018-07-20 ENCOUNTER — Encounter (INDEPENDENT_AMBULATORY_CARE_PROVIDER_SITE_OTHER): Payer: Self-pay | Admitting: Orthopedic Surgery

## 2018-07-20 DIAGNOSIS — G894 Chronic pain syndrome: Secondary | ICD-10-CM | POA: Diagnosis not present

## 2018-07-20 DIAGNOSIS — G802 Spastic hemiplegic cerebral palsy: Secondary | ICD-10-CM | POA: Diagnosis not present

## 2018-07-20 NOTE — Progress Notes (Signed)
Office Visit Note   Patient: [name]           Date of Birth: [birth year]           MRN: [id number] Visit Date: 07/15/2018              Requested by: Care, [facility] [address] [place], [place] [address] PCP: Care, [facility]  Chief Complaint  Patient presents with  . Right Leg - Follow-up, Pain  . Right Knee - Follow-up, Pain      HPI: Patient is a 52 year old woman who presents 5 months status post revision right total knee arthroplasty with a hinged knee.  She states that heating pad helps she elevates her legs some has taken fluid pills states she has decreased range of motion.  Patient states she wants to do water aerobics.  Assessment & Plan: Visit Diagnoses:  1. Status post revision of total replacement of right knee     Plan: Patient was given a prescription to go to the [facility] for water aerobics.  Recommend continuing home health physical therapy.  Patient is currently in a motorized wheelchair.  Follow-Up Instructions: Return in about 4 weeks (around 08/12/2018).   Ortho Exam  Patient is alert, oriented, no adenopathy, well-dressed, normal affect, normal respiratory effort. Examination patient's leg is straight there is no redness no cellulitis no signs of infection.  Range of motion 0 to 45 degrees.  Patient states she can ambulate approximately 5 feet.  Imaging: No results found. No images are attached to the encounter.  Labs: Lab Results  Component Value Date   HGBA1C 7.1 (H) 05/14/2018   HGBA1C 11.3 (H) 01/03/2013   HGBA1C 8.6 (H) 03/05/2011   REPTSTATUS 04/16/2011 FINAL 04/10/2011   CULT NO GROWTH 5 DAYS 04/10/2011     Lab Results  Component Value Date   ALBUMIN 2.9 (L) 02/10/2018   ALBUMIN 2.5 (L) 11/10/2017   ALBUMIN 3.0 (L) 05/14/2014    Body mass index is 59.7 kg/m.  Orders:  No orders of the defined types were placed in this encounter.  Meds ordered this encounter    Medications  . oxyCODONE-acetaminophen (PERCOCET) 10-325 MG tablet    Sig: Take 1 tablet by mouth every 6 (six) hours as needed for pain.    Dispense:  28 tablet    Refill:  0     Procedures: No procedures performed  Clinical Data: No additional findings.  ROS:  All other systems negative, except as noted in the HPI. Review of Systems  Objective: Vital Signs: Ht 5\' 3"  (1.6 m)   Wt (!) 337 lb (152.9 kg)   LMP 11/09/2012   BMI 59.70 kg/m   Specialty Comments:  No specialty comments available.  PMFS History: Patient Active Problem List   Diagnosis Date Noted  . S/P revision of total knee, right 05/14/2018  . Dislocated knee, right, sequela   . S/P revision of total knee 02/17/2018  . Failed total right knee replacement ([place])   . Chronic renal insufficiency, stage 2 (mild) 11/10/2017  . Acute CHF (congestive heart failure) ([place]) 11/10/2017  . Failed total knee arthroplasty, sequela 08/31/2017  . Lower extremity cellulitis 01/03/2013  . Diabetes mellitus ([place]) 01/03/2013  . Morbid (severe) obesity due to excess calories ([place]) 11/12/2006  . TOBACCO DEPENDENCE 11/12/2006  . DEPRESSIVE DISORDER, NOS 11/12/2006  . HYPERTENSION, BENIGN SYSTEMIC 11/12/2006  . GASTROESOPHAGEAL REFLUX, NO ESOPHAGITIS 11/12/2006  . ACNE 11/12/2006  .  OSTEOARTHRITIS, LOWER LEG 11/12/2006  . APNEA, SLEEP 11/12/2006   Past Medical History:  Diagnosis Date  . Arthritis    "hands, arms, feet, back, knees" (02/17/2018)  . Cardiomyopathy [facility])    From Dr. Bonney Roussel office notes from 01/13/18  . CHF (congestive heart failure) ([place])   . Chronic lower back pain   . Diabetic neuropathy ([place])   . Family history of adverse reaction to anesthesia    Mother is hard to arouse and blood pressure drops  . GERD (gastroesophageal reflux disease)   . High cholesterol   . Hypertension   . Sleep apnea    "need new machine"  - does not use a cpap (02/17/2018)  . Type II diabetes mellitus ([place])    no longer  on medications (02/17/2018)    Family History  Problem Relation Age of Onset  . Kidney disease Mother   . Congestive Heart Failure Mother   . Hypertension Father     Past Surgical History:  Procedure Laterality Date  . ANTERIOR CERVICAL DECOMP/DISCECTOMY FUSION  03/06/2010   C4-7 w/corpectomy/notes 03/17/2010  . BACK SURGERY    . CARPAL TUNNEL RELEASE Right   . HARDWARE REVISION  04/10/2010   cervical hardware revision screw replacement C4/notes 04/12/2010  . JOINT REPLACEMENT    . LAPAROSCOPIC CHOLECYSTECTOMY  1999  . LUMBAR LAMINECTOMY/DECOMPRESSION MICRODISCECTOMY  09/2010   Archie Endo 10/05/2010  . MULTIPLE EXTRACTIONS WITH ALVEOLOPLASTY [name] 01/19/2015   Procedure: MULTIPLE EXTRACTIONS Three, Four, Five, Seven, eight, nine, ten, eleven, twelve, fourteen, eighteen, twenty-one, twenty-eight, twenty-nine, thirty-one with Alveoloplasty.  ;  Surgeon: Diona Browner, DDS;  Location: [facility];  Service: Oral Surgery;  Laterality: [name];  . REPLACEMENT TOTAL KNEE BILATERAL Bilateral 10/2007-03/2008   "left-right"  . TOENAIL EXCISION Bilateral    great toes  . TONSILLECTOMY    . TOTAL KNEE REVISION Right 02/17/2018  . TOTAL KNEE REVISION Right 02/17/2018   Procedure: RIGHT TOTAL KNEE REVISION;  Surgeon: Newt Minion, MD;  Location: [facility];  Service: Orthopedics;  Laterality: Right;  . TOTAL KNEE REVISION Right 05/14/2018   Procedure: RIGHT TOTAL KNEE REVISION TO HINGED KNEE;  Surgeon: Newt Minion, MD;  Location: [facility];  Service: Orthopedics;  Laterality: Right;   Social History   Occupational History  . Not on file  Tobacco Use  . Smoking status: Current Every Day Smoker    Packs/day: 0.50    Years: 32.00    Pack years: 16.00    Types: Cigarettes  . Smokeless tobacco: Never Used  Substance and Sexual Activity  . Alcohol use: Not Currently  . Drug use: Not Currently    Types: Marijuana  . Sexual activity: Not Currently

## 2018-07-21 ENCOUNTER — Telehealth (INDEPENDENT_AMBULATORY_CARE_PROVIDER_SITE_OTHER): Payer: Self-pay | Admitting: Orthopedic Surgery

## 2018-07-21 ENCOUNTER — Other Ambulatory Visit (INDEPENDENT_AMBULATORY_CARE_PROVIDER_SITE_OTHER): Payer: Self-pay | Admitting: Physician Assistant

## 2018-07-21 DIAGNOSIS — E1122 Type 2 diabetes mellitus with diabetic chronic kidney disease: Secondary | ICD-10-CM | POA: Diagnosis not present

## 2018-07-21 DIAGNOSIS — I130 Hypertensive heart and chronic kidney disease with heart failure and stage 1 through stage 4 chronic kidney disease, or unspecified chronic kidney disease: Secondary | ICD-10-CM | POA: Diagnosis not present

## 2018-07-21 DIAGNOSIS — E1140 Type 2 diabetes mellitus with diabetic neuropathy, unspecified: Secondary | ICD-10-CM | POA: Diagnosis not present

## 2018-07-21 DIAGNOSIS — N182 Chronic kidney disease, stage 2 (mild): Secondary | ICD-10-CM | POA: Diagnosis not present

## 2018-07-21 DIAGNOSIS — I509 Heart failure, unspecified: Secondary | ICD-10-CM | POA: Diagnosis not present

## 2018-07-21 DIAGNOSIS — T84028D Dislocation of other internal joint prosthesis, subsequent encounter: Secondary | ICD-10-CM | POA: Diagnosis not present

## 2018-07-21 DIAGNOSIS — E785 Hyperlipidemia, unspecified: Secondary | ICD-10-CM | POA: Diagnosis not present

## 2018-07-21 DIAGNOSIS — G4733 Obstructive sleep apnea (adult) (pediatric): Secondary | ICD-10-CM | POA: Diagnosis not present

## 2018-07-21 DIAGNOSIS — T84012D Broken internal right knee prosthesis, subsequent encounter: Secondary | ICD-10-CM | POA: Diagnosis not present

## 2018-07-21 DIAGNOSIS — Z87891 Personal history of nicotine dependence: Secondary | ICD-10-CM | POA: Diagnosis not present

## 2018-07-21 DIAGNOSIS — K219 Gastro-esophageal reflux disease without esophagitis: Secondary | ICD-10-CM | POA: Diagnosis not present

## 2018-07-21 MED ORDER — OXYCODONE-ACETAMINOPHEN 10-325 MG PO TABS: 1.0000 | ORAL_TABLET | Freq: Three times a day (TID) | ORAL | 0 refills | Status: DC | PRN

## 2018-07-21 NOTE — Telephone Encounter (Signed)
Patient called requesting an RX refill on Oxycodone, but it is not needed until Friday, 07/23/18.  CB#[phone].  Thank you.

## 2018-07-21 NOTE — Telephone Encounter (Signed)
Will refill but she needs to start lengthening the time between doses

## 2018-07-21 NOTE — Telephone Encounter (Signed)
I called and advised pt tht rx is ready for pick up. Advised that we do need to try and extend the time in between doses trying to taper down off the narcotic pain medication.

## 2018-07-21 NOTE — Telephone Encounter (Signed)
Pt is s/p a right total knee 05/14/18. Requesting refill of percocet. October received 84 percocet 10/325  and 40 tramadol

## 2018-07-23 DIAGNOSIS — T84012D Broken internal right knee prosthesis, subsequent encounter: Secondary | ICD-10-CM | POA: Diagnosis not present

## 2018-07-23 DIAGNOSIS — G4733 Obstructive sleep apnea (adult) (pediatric): Secondary | ICD-10-CM | POA: Diagnosis not present

## 2018-07-23 DIAGNOSIS — N182 Chronic kidney disease, stage 2 (mild): Secondary | ICD-10-CM | POA: Diagnosis not present

## 2018-07-23 DIAGNOSIS — E1140 Type 2 diabetes mellitus with diabetic neuropathy, unspecified: Secondary | ICD-10-CM | POA: Diagnosis not present

## 2018-07-23 DIAGNOSIS — E1122 Type 2 diabetes mellitus with diabetic chronic kidney disease: Secondary | ICD-10-CM | POA: Diagnosis not present

## 2018-07-23 DIAGNOSIS — Z87891 Personal history of nicotine dependence: Secondary | ICD-10-CM | POA: Diagnosis not present

## 2018-07-23 DIAGNOSIS — I130 Hypertensive heart and chronic kidney disease with heart failure and stage 1 through stage 4 chronic kidney disease, or unspecified chronic kidney disease: Secondary | ICD-10-CM | POA: Diagnosis not present

## 2018-07-23 DIAGNOSIS — T84028D Dislocation of other internal joint prosthesis, subsequent encounter: Secondary | ICD-10-CM | POA: Diagnosis not present

## 2018-07-23 DIAGNOSIS — E785 Hyperlipidemia, unspecified: Secondary | ICD-10-CM | POA: Diagnosis not present

## 2018-07-23 DIAGNOSIS — K219 Gastro-esophageal reflux disease without esophagitis: Secondary | ICD-10-CM | POA: Diagnosis not present

## 2018-07-23 DIAGNOSIS — I509 Heart failure, unspecified: Secondary | ICD-10-CM | POA: Diagnosis not present

## 2018-07-26 DIAGNOSIS — I509 Heart failure, unspecified: Secondary | ICD-10-CM | POA: Diagnosis not present

## 2018-07-26 DIAGNOSIS — T84012D Broken internal right knee prosthesis, subsequent encounter: Secondary | ICD-10-CM | POA: Diagnosis not present

## 2018-07-26 DIAGNOSIS — E785 Hyperlipidemia, unspecified: Secondary | ICD-10-CM | POA: Diagnosis not present

## 2018-07-26 DIAGNOSIS — I130 Hypertensive heart and chronic kidney disease with heart failure and stage 1 through stage 4 chronic kidney disease, or unspecified chronic kidney disease: Secondary | ICD-10-CM | POA: Diagnosis not present

## 2018-07-26 DIAGNOSIS — Z87891 Personal history of nicotine dependence: Secondary | ICD-10-CM | POA: Diagnosis not present

## 2018-07-26 DIAGNOSIS — E1140 Type 2 diabetes mellitus with diabetic neuropathy, unspecified: Secondary | ICD-10-CM | POA: Diagnosis not present

## 2018-07-26 DIAGNOSIS — K219 Gastro-esophageal reflux disease without esophagitis: Secondary | ICD-10-CM | POA: Diagnosis not present

## 2018-07-26 DIAGNOSIS — T84028D Dislocation of other internal joint prosthesis, subsequent encounter: Secondary | ICD-10-CM | POA: Diagnosis not present

## 2018-07-26 DIAGNOSIS — G4733 Obstructive sleep apnea (adult) (pediatric): Secondary | ICD-10-CM | POA: Diagnosis not present

## 2018-07-26 DIAGNOSIS — E1122 Type 2 diabetes mellitus with diabetic chronic kidney disease: Secondary | ICD-10-CM | POA: Diagnosis not present

## 2018-07-26 DIAGNOSIS — N182 Chronic kidney disease, stage 2 (mild): Secondary | ICD-10-CM | POA: Diagnosis not present

## 2018-07-28 DIAGNOSIS — E1122 Type 2 diabetes mellitus with diabetic chronic kidney disease: Secondary | ICD-10-CM | POA: Diagnosis not present

## 2018-07-28 DIAGNOSIS — K219 Gastro-esophageal reflux disease without esophagitis: Secondary | ICD-10-CM | POA: Diagnosis not present

## 2018-07-28 DIAGNOSIS — E785 Hyperlipidemia, unspecified: Secondary | ICD-10-CM | POA: Diagnosis not present

## 2018-07-28 DIAGNOSIS — Z87891 Personal history of nicotine dependence: Secondary | ICD-10-CM | POA: Diagnosis not present

## 2018-07-28 DIAGNOSIS — T84028D Dislocation of other internal joint prosthesis, subsequent encounter: Secondary | ICD-10-CM | POA: Diagnosis not present

## 2018-07-28 DIAGNOSIS — E1140 Type 2 diabetes mellitus with diabetic neuropathy, unspecified: Secondary | ICD-10-CM | POA: Diagnosis not present

## 2018-07-28 DIAGNOSIS — I130 Hypertensive heart and chronic kidney disease with heart failure and stage 1 through stage 4 chronic kidney disease, or unspecified chronic kidney disease: Secondary | ICD-10-CM | POA: Diagnosis not present

## 2018-07-28 DIAGNOSIS — G4733 Obstructive sleep apnea (adult) (pediatric): Secondary | ICD-10-CM | POA: Diagnosis not present

## 2018-07-28 DIAGNOSIS — I509 Heart failure, unspecified: Secondary | ICD-10-CM | POA: Diagnosis not present

## 2018-07-28 DIAGNOSIS — N182 Chronic kidney disease, stage 2 (mild): Secondary | ICD-10-CM | POA: Diagnosis not present

## 2018-07-28 DIAGNOSIS — T84012D Broken internal right knee prosthesis, subsequent encounter: Secondary | ICD-10-CM | POA: Diagnosis not present

## 2018-07-30 ENCOUNTER — Telehealth (INDEPENDENT_AMBULATORY_CARE_PROVIDER_SITE_OTHER): Payer: Self-pay | Admitting: Orthopedic Surgery

## 2018-07-30 NOTE — Telephone Encounter (Signed)
Cindee Salt with [facility] left a message requesting VO for [facility] PT for 8x a week for 2 weeks.  [phone].  Thank you.

## 2018-08-02 ENCOUNTER — Telehealth (INDEPENDENT_AMBULATORY_CARE_PROVIDER_SITE_OTHER): Payer: Self-pay | Admitting: Orthopedic Surgery

## 2018-08-02 ENCOUNTER — Other Ambulatory Visit (INDEPENDENT_AMBULATORY_CARE_PROVIDER_SITE_OTHER): Payer: Self-pay | Admitting: Orthopedic Surgery

## 2018-08-02 MED ORDER — OXYCODONE-ACETAMINOPHEN 10-325 MG PO TABS: 1.0000 | ORAL_TABLET | Freq: Three times a day (TID) | ORAL | 0 refills | Status: DC | PRN

## 2018-08-02 NOTE — Telephone Encounter (Signed)
Called and lm on vm to advise verbal  ok for therapy orders below. To call with questions.

## 2018-08-02 NOTE — Telephone Encounter (Signed)
Pt is s/p a right revision total knee replacement requesting refill on percocet 10/325. Last refill 07/22/18 #28

## 2018-08-02 NOTE — Telephone Encounter (Signed)
I called patient and informed her that her Rx was here to be picked up at front office, Aide will be picking it up for her tomorrow.

## 2018-08-02 NOTE — Telephone Encounter (Signed)
rx written

## 2018-08-02 NOTE — Telephone Encounter (Signed)
Patient called to request Percocet 10-325.  Please call patient to advise.

## 2018-08-03 DIAGNOSIS — I130 Hypertensive heart and chronic kidney disease with heart failure and stage 1 through stage 4 chronic kidney disease, or unspecified chronic kidney disease: Secondary | ICD-10-CM | POA: Diagnosis not present

## 2018-08-03 DIAGNOSIS — Z87891 Personal history of nicotine dependence: Secondary | ICD-10-CM | POA: Diagnosis not present

## 2018-08-03 DIAGNOSIS — T84028D Dislocation of other internal joint prosthesis, subsequent encounter: Secondary | ICD-10-CM | POA: Diagnosis not present

## 2018-08-03 DIAGNOSIS — E785 Hyperlipidemia, unspecified: Secondary | ICD-10-CM | POA: Diagnosis not present

## 2018-08-03 DIAGNOSIS — G4733 Obstructive sleep apnea (adult) (pediatric): Secondary | ICD-10-CM | POA: Diagnosis not present

## 2018-08-03 DIAGNOSIS — T84012D Broken internal right knee prosthesis, subsequent encounter: Secondary | ICD-10-CM | POA: Diagnosis not present

## 2018-08-03 DIAGNOSIS — N182 Chronic kidney disease, stage 2 (mild): Secondary | ICD-10-CM | POA: Diagnosis not present

## 2018-08-03 DIAGNOSIS — E1140 Type 2 diabetes mellitus with diabetic neuropathy, unspecified: Secondary | ICD-10-CM | POA: Diagnosis not present

## 2018-08-03 DIAGNOSIS — E1122 Type 2 diabetes mellitus with diabetic chronic kidney disease: Secondary | ICD-10-CM | POA: Diagnosis not present

## 2018-08-03 DIAGNOSIS — K219 Gastro-esophageal reflux disease without esophagitis: Secondary | ICD-10-CM | POA: Diagnosis not present

## 2018-08-03 DIAGNOSIS — I509 Heart failure, unspecified: Secondary | ICD-10-CM | POA: Diagnosis not present

## 2018-08-06 DIAGNOSIS — Z87891 Personal history of nicotine dependence: Secondary | ICD-10-CM | POA: Diagnosis not present

## 2018-08-06 DIAGNOSIS — T84028D Dislocation of other internal joint prosthesis, subsequent encounter: Secondary | ICD-10-CM | POA: Diagnosis not present

## 2018-08-06 DIAGNOSIS — N182 Chronic kidney disease, stage 2 (mild): Secondary | ICD-10-CM | POA: Diagnosis not present

## 2018-08-06 DIAGNOSIS — I509 Heart failure, unspecified: Secondary | ICD-10-CM | POA: Diagnosis not present

## 2018-08-06 DIAGNOSIS — I130 Hypertensive heart and chronic kidney disease with heart failure and stage 1 through stage 4 chronic kidney disease, or unspecified chronic kidney disease: Secondary | ICD-10-CM | POA: Diagnosis not present

## 2018-08-06 DIAGNOSIS — K219 Gastro-esophageal reflux disease without esophagitis: Secondary | ICD-10-CM | POA: Diagnosis not present

## 2018-08-06 DIAGNOSIS — E1140 Type 2 diabetes mellitus with diabetic neuropathy, unspecified: Secondary | ICD-10-CM | POA: Diagnosis not present

## 2018-08-06 DIAGNOSIS — G4733 Obstructive sleep apnea (adult) (pediatric): Secondary | ICD-10-CM | POA: Diagnosis not present

## 2018-08-06 DIAGNOSIS — E1122 Type 2 diabetes mellitus with diabetic chronic kidney disease: Secondary | ICD-10-CM | POA: Diagnosis not present

## 2018-08-06 DIAGNOSIS — E785 Hyperlipidemia, unspecified: Secondary | ICD-10-CM | POA: Diagnosis not present

## 2018-08-06 DIAGNOSIS — T84012D Broken internal right knee prosthesis, subsequent encounter: Secondary | ICD-10-CM | POA: Diagnosis not present

## 2018-08-11 DIAGNOSIS — E1140 Type 2 diabetes mellitus with diabetic neuropathy, unspecified: Secondary | ICD-10-CM | POA: Diagnosis not present

## 2018-08-11 DIAGNOSIS — I509 Heart failure, unspecified: Secondary | ICD-10-CM | POA: Diagnosis not present

## 2018-08-11 DIAGNOSIS — E1122 Type 2 diabetes mellitus with diabetic chronic kidney disease: Secondary | ICD-10-CM | POA: Diagnosis not present

## 2018-08-11 DIAGNOSIS — N182 Chronic kidney disease, stage 2 (mild): Secondary | ICD-10-CM | POA: Diagnosis not present

## 2018-08-11 DIAGNOSIS — K219 Gastro-esophageal reflux disease without esophagitis: Secondary | ICD-10-CM | POA: Diagnosis not present

## 2018-08-11 DIAGNOSIS — G4733 Obstructive sleep apnea (adult) (pediatric): Secondary | ICD-10-CM | POA: Diagnosis not present

## 2018-08-11 DIAGNOSIS — E785 Hyperlipidemia, unspecified: Secondary | ICD-10-CM | POA: Diagnosis not present

## 2018-08-11 DIAGNOSIS — T84012D Broken internal right knee prosthesis, subsequent encounter: Secondary | ICD-10-CM | POA: Diagnosis not present

## 2018-08-11 DIAGNOSIS — Z87891 Personal history of nicotine dependence: Secondary | ICD-10-CM | POA: Diagnosis not present

## 2018-08-11 DIAGNOSIS — I130 Hypertensive heart and chronic kidney disease with heart failure and stage 1 through stage 4 chronic kidney disease, or unspecified chronic kidney disease: Secondary | ICD-10-CM | POA: Diagnosis not present

## 2018-08-11 DIAGNOSIS — T84028D Dislocation of other internal joint prosthesis, subsequent encounter: Secondary | ICD-10-CM | POA: Diagnosis not present

## 2018-08-16 ENCOUNTER — Ambulatory Visit (INDEPENDENT_AMBULATORY_CARE_PROVIDER_SITE_OTHER): Payer: Medicare Other | Admitting: Orthopedic Surgery

## 2018-08-16 DIAGNOSIS — M1710 Unilateral primary osteoarthritis, unspecified knee: Secondary | ICD-10-CM | POA: Diagnosis not present

## 2018-08-16 DIAGNOSIS — S83104A Unspecified dislocation of right knee, initial encounter: Secondary | ICD-10-CM | POA: Diagnosis not present

## 2018-08-17 ENCOUNTER — Ambulatory Visit (INDEPENDENT_AMBULATORY_CARE_PROVIDER_SITE_OTHER): Payer: Medicare Other | Admitting: Orthopedic Surgery

## 2018-08-17 ENCOUNTER — Telehealth (INDEPENDENT_AMBULATORY_CARE_PROVIDER_SITE_OTHER): Payer: Self-pay | Admitting: Orthopedic Surgery

## 2018-08-17 NOTE — Telephone Encounter (Signed)
I called pt and advised that will hold to discuss with Dr. Sharol Given at appt on Monday.

## 2018-08-17 NOTE — Telephone Encounter (Signed)
Patient called to request an RX refill on her Oxycodone 10-325.  CB#[phone].  Thank you.

## 2018-08-18 DIAGNOSIS — T84012D Broken internal right knee prosthesis, subsequent encounter: Secondary | ICD-10-CM | POA: Diagnosis not present

## 2018-08-18 DIAGNOSIS — E1140 Type 2 diabetes mellitus with diabetic neuropathy, unspecified: Secondary | ICD-10-CM | POA: Diagnosis not present

## 2018-08-18 DIAGNOSIS — K219 Gastro-esophageal reflux disease without esophagitis: Secondary | ICD-10-CM | POA: Diagnosis not present

## 2018-08-18 DIAGNOSIS — I509 Heart failure, unspecified: Secondary | ICD-10-CM | POA: Diagnosis not present

## 2018-08-18 DIAGNOSIS — T84028D Dislocation of other internal joint prosthesis, subsequent encounter: Secondary | ICD-10-CM | POA: Diagnosis not present

## 2018-08-18 DIAGNOSIS — Z87891 Personal history of nicotine dependence: Secondary | ICD-10-CM | POA: Diagnosis not present

## 2018-08-18 DIAGNOSIS — E1122 Type 2 diabetes mellitus with diabetic chronic kidney disease: Secondary | ICD-10-CM | POA: Diagnosis not present

## 2018-08-18 DIAGNOSIS — N182 Chronic kidney disease, stage 2 (mild): Secondary | ICD-10-CM | POA: Diagnosis not present

## 2018-08-18 DIAGNOSIS — I130 Hypertensive heart and chronic kidney disease with heart failure and stage 1 through stage 4 chronic kidney disease, or unspecified chronic kidney disease: Secondary | ICD-10-CM | POA: Diagnosis not present

## 2018-08-18 DIAGNOSIS — G4733 Obstructive sleep apnea (adult) (pediatric): Secondary | ICD-10-CM | POA: Diagnosis not present

## 2018-08-18 DIAGNOSIS — E785 Hyperlipidemia, unspecified: Secondary | ICD-10-CM | POA: Diagnosis not present

## 2018-08-19 ENCOUNTER — Ambulatory Visit (INDEPENDENT_AMBULATORY_CARE_PROVIDER_SITE_OTHER): Payer: Medicare Other | Admitting: Physician Assistant

## 2018-08-19 ENCOUNTER — Encounter (INDEPENDENT_AMBULATORY_CARE_PROVIDER_SITE_OTHER): Payer: Self-pay | Admitting: Physician Assistant

## 2018-08-19 VITALS — Ht 63.0 in | Wt 337.0 lb

## 2018-08-19 DIAGNOSIS — Z96651 Presence of right artificial knee joint: Secondary | ICD-10-CM

## 2018-08-19 DIAGNOSIS — G894 Chronic pain syndrome: Secondary | ICD-10-CM | POA: Diagnosis not present

## 2018-08-19 DIAGNOSIS — G802 Spastic hemiplegic cerebral palsy: Secondary | ICD-10-CM | POA: Diagnosis not present

## 2018-08-19 DIAGNOSIS — M25561 Pain in right knee: Secondary | ICD-10-CM | POA: Diagnosis not present

## 2018-08-19 MED ORDER — OXYCODONE-ACETAMINOPHEN 10-325 MG PO TABS: 1.0000 | ORAL_TABLET | Freq: Three times a day (TID) | ORAL | 0 refills | Status: DC | PRN

## 2018-08-20 ENCOUNTER — Encounter (INDEPENDENT_AMBULATORY_CARE_PROVIDER_SITE_OTHER): Payer: Self-pay | Admitting: Physician Assistant

## 2018-08-20 DIAGNOSIS — I130 Hypertensive heart and chronic kidney disease with heart failure and stage 1 through stage 4 chronic kidney disease, or unspecified chronic kidney disease: Secondary | ICD-10-CM | POA: Diagnosis not present

## 2018-08-20 DIAGNOSIS — Z87891 Personal history of nicotine dependence: Secondary | ICD-10-CM | POA: Diagnosis not present

## 2018-08-20 DIAGNOSIS — E1140 Type 2 diabetes mellitus with diabetic neuropathy, unspecified: Secondary | ICD-10-CM | POA: Diagnosis not present

## 2018-08-20 DIAGNOSIS — G4733 Obstructive sleep apnea (adult) (pediatric): Secondary | ICD-10-CM | POA: Diagnosis not present

## 2018-08-20 DIAGNOSIS — T84012D Broken internal right knee prosthesis, subsequent encounter: Secondary | ICD-10-CM | POA: Diagnosis not present

## 2018-08-20 DIAGNOSIS — E785 Hyperlipidemia, unspecified: Secondary | ICD-10-CM | POA: Diagnosis not present

## 2018-08-20 DIAGNOSIS — E1122 Type 2 diabetes mellitus with diabetic chronic kidney disease: Secondary | ICD-10-CM | POA: Diagnosis not present

## 2018-08-20 DIAGNOSIS — I509 Heart failure, unspecified: Secondary | ICD-10-CM | POA: Diagnosis not present

## 2018-08-20 DIAGNOSIS — K219 Gastro-esophageal reflux disease without esophagitis: Secondary | ICD-10-CM | POA: Diagnosis not present

## 2018-08-20 DIAGNOSIS — N182 Chronic kidney disease, stage 2 (mild): Secondary | ICD-10-CM | POA: Diagnosis not present

## 2018-08-20 DIAGNOSIS — T84028D Dislocation of other internal joint prosthesis, subsequent encounter: Secondary | ICD-10-CM | POA: Diagnosis not present

## 2018-08-20 NOTE — Progress Notes (Addendum)
Office Visit Note   Patient: [name]           Date of Birth: November 17, 1965           MRN: [id number] Visit Date: 08/19/2018              Requested by: [facility], [facility] [address] [place], [place] [address] PCP: Care, [facility]  Chief Complaint  Patient presents with  . Right Knee - Follow-up    05/14/18 right total knee revision      HPI: The patient is a 52 yo woman who presents for follow up following a revision of right total knee arthroplasty on 05/14/2018. She is at home and working with physical therapy. She is using a power wheelchair for most of her mobility. She has PT and OT several times weekly now. She continues to require medications for pain and requested a referral to pain management to help her with pain control. She reports moderate to severe pain at times with therapies. She is wearing her knee immobilizer when up ambulating. She is working on range of motion with PT.   Assessment & Plan: Visit Diagnoses:  1. Status post revision of total replacement of right knee     Plan: Continue PT/OT to address continued mobility, strengthening and range of motion deficits. Will refer to pain management as requested. Will refill Percocet and await pain management referral.  Patient was provided another knee sleeve as the current brace is not closing anymore and coming apart.  follow up in 6 weeks.   Follow-Up Instructions: Return in about 6 weeks (around 09/30/2018).   Ortho Exam  Patient is alert, oriented, no adenopathy, well-dressed, normal affect, normal respiratory effort.Presents in motorized wheelchair. Morbidly obese.  Knee immobilizer coming apart.  The right knee incision is well healed. There are no signs of infection or cellulitis. The range of motion actively is 0-40 degrees. She is able to independently straight leg raise, but only for very short duration.   Imaging: No results found. No images  are attached to the encounter.  Labs: Lab Results  Component Value Date   HGBA1C 7.1 (H) 05/14/2018   HGBA1C 11.3 (H) 01/03/2013   HGBA1C 8.6 (H) 03/05/2011   REPTSTATUS 04/16/2011 FINAL 04/10/2011   CULT NO GROWTH 5 DAYS 04/10/2011     Lab Results  Component Value Date   ALBUMIN 2.9 (L) 02/10/2018   ALBUMIN 2.5 (L) 11/10/2017   ALBUMIN 3.0 (L) 05/14/2014    Body mass index is 59.7 kg/m.  Orders:  Orders Placed This Encounter  Procedures  . Ambulatory referral to Pain Clinic   Meds ordered this encounter  Medications  . oxyCODONE-acetaminophen (PERCOCET) 10-325 MG tablet    Sig: Take 1 tablet by mouth every 8 (eight) hours as needed for pain.    Dispense:  28 tablet    Refill:  0     Procedures: No procedures performed  Clinical Data: No additional findings.  ROS:  All other systems negative, except as noted in the HPI. Review of Systems  Objective: Vital Signs: Ht 5\' 3"  (1.6 m)   Wt (!) 337 lb (152.9 kg)   LMP 11/09/2012   BMI 59.70 kg/m   Specialty Comments:  No specialty comments available.  PMFS History: Patient Active Problem List   Diagnosis Date Noted  . S/P revision of total knee, right 05/14/2018  . Dislocated knee, right, sequela   . S/P revision of  total knee 02/17/2018  . Failed total right knee replacement ([place])   . Chronic renal insufficiency, stage 2 (mild) 11/10/2017  . Acute CHF (congestive heart failure) ([place]) 11/10/2017  . Failed total knee arthroplasty, sequela 08/31/2017  . Lower extremity cellulitis 01/03/2013  . Diabetes mellitus ([place]) 01/03/2013  . Morbid (severe) obesity due to excess calories ([place]) 11/12/2006  . TOBACCO DEPENDENCE 11/12/2006  . DEPRESSIVE DISORDER, NOS 11/12/2006  . HYPERTENSION, BENIGN SYSTEMIC 11/12/2006  . GASTROESOPHAGEAL REFLUX, NO ESOPHAGITIS 11/12/2006  . ACNE 11/12/2006  . OSTEOARTHRITIS, LOWER LEG 11/12/2006  . APNEA, SLEEP 11/12/2006   Past Medical History:  Diagnosis Date  . Arthritis     "hands, arms, feet, back, knees" (02/17/2018)  . Cardiomyopathy [facility])    From Dr. Bonney Roussel office notes from 01/13/18  . CHF (congestive heart failure) ([place])   . Chronic lower back pain   . Diabetic neuropathy ([place])   . Family history of adverse reaction to anesthesia    Mother is hard to arouse and blood pressure drops  . GERD (gastroesophageal reflux disease)   . High cholesterol   . Hypertension   . Sleep apnea    "need new machine"  - does not use a cpap (02/17/2018)  . Type II diabetes mellitus ([place])    no longer on medications (02/17/2018)    Family History  Problem Relation Age of Onset  . Kidney disease Mother   . Congestive Heart Failure Mother   . Hypertension Father     Past Surgical History:  Procedure Laterality Date  . ANTERIOR CERVICAL DECOMP/DISCECTOMY FUSION  03/06/2010   C4-7 w/corpectomy/notes 03/17/2010  . BACK SURGERY    . CARPAL TUNNEL RELEASE Right   . HARDWARE REVISION  04/10/2010   cervical hardware revision screw replacement C4/notes 04/12/2010  . JOINT REPLACEMENT    . LAPAROSCOPIC CHOLECYSTECTOMY  1999  . LUMBAR LAMINECTOMY/DECOMPRESSION MICRODISCECTOMY  09/2010   Archie Endo 10/05/2010  . MULTIPLE EXTRACTIONS WITH ALVEOLOPLASTY [name] 01/19/2015   Procedure: MULTIPLE EXTRACTIONS Three, Four, Five, Seven, eight, nine, ten, eleven, twelve, fourteen, eighteen, twenty-one, twenty-eight, twenty-nine, thirty-one with Alveoloplasty.  ;  Surgeon: Diona Browner, DDS;  Location: [facility];  Service: Oral Surgery;  Laterality: [name];  . REPLACEMENT TOTAL KNEE BILATERAL Bilateral 10/2007-03/2008   "left-right"  . TOENAIL EXCISION Bilateral    great toes  . TONSILLECTOMY    . TOTAL KNEE REVISION Right 02/17/2018  . TOTAL KNEE REVISION Right 02/17/2018   Procedure: RIGHT TOTAL KNEE REVISION;  Surgeon: Newt Minion, MD;  Location: [facility];  Service: Orthopedics;  Laterality: Right;  . TOTAL KNEE REVISION Right 05/14/2018   Procedure: RIGHT TOTAL KNEE REVISION TO HINGED KNEE;   Surgeon: Newt Minion, MD;  Location: [facility];  Service: Orthopedics;  Laterality: Right;   Social History   Occupational History  . Not on file  Tobacco Use  . Smoking status: Current Every Day Smoker    Packs/day: 0.50    Years: 32.00    Pack years: 16.00    Types: Cigarettes  . Smokeless tobacco: Never Used  Substance and Sexual Activity  . Alcohol use: Not Currently  . Drug use: Not Currently    Types: Marijuana  . Sexual activity: Not Currently

## 2018-08-23 ENCOUNTER — Ambulatory Visit (INDEPENDENT_AMBULATORY_CARE_PROVIDER_SITE_OTHER): Payer: Medicare Other | Admitting: Orthopedic Surgery

## 2018-08-24 DIAGNOSIS — E785 Hyperlipidemia, unspecified: Secondary | ICD-10-CM | POA: Diagnosis not present

## 2018-08-24 DIAGNOSIS — T84028D Dislocation of other internal joint prosthesis, subsequent encounter: Secondary | ICD-10-CM | POA: Diagnosis not present

## 2018-08-24 DIAGNOSIS — T84012D Broken internal right knee prosthesis, subsequent encounter: Secondary | ICD-10-CM | POA: Diagnosis not present

## 2018-08-24 DIAGNOSIS — I509 Heart failure, unspecified: Secondary | ICD-10-CM | POA: Diagnosis not present

## 2018-08-24 DIAGNOSIS — K219 Gastro-esophageal reflux disease without esophagitis: Secondary | ICD-10-CM | POA: Diagnosis not present

## 2018-08-24 DIAGNOSIS — E1140 Type 2 diabetes mellitus with diabetic neuropathy, unspecified: Secondary | ICD-10-CM | POA: Diagnosis not present

## 2018-08-24 DIAGNOSIS — I130 Hypertensive heart and chronic kidney disease with heart failure and stage 1 through stage 4 chronic kidney disease, or unspecified chronic kidney disease: Secondary | ICD-10-CM | POA: Diagnosis not present

## 2018-08-24 DIAGNOSIS — Z87891 Personal history of nicotine dependence: Secondary | ICD-10-CM | POA: Diagnosis not present

## 2018-08-24 DIAGNOSIS — N182 Chronic kidney disease, stage 2 (mild): Secondary | ICD-10-CM | POA: Diagnosis not present

## 2018-08-24 DIAGNOSIS — G4733 Obstructive sleep apnea (adult) (pediatric): Secondary | ICD-10-CM | POA: Diagnosis not present

## 2018-08-24 DIAGNOSIS — E1122 Type 2 diabetes mellitus with diabetic chronic kidney disease: Secondary | ICD-10-CM | POA: Diagnosis not present

## 2018-08-26 ENCOUNTER — Telehealth (INDEPENDENT_AMBULATORY_CARE_PROVIDER_SITE_OTHER): Payer: Self-pay | Admitting: Orthopedic Surgery

## 2018-08-26 DIAGNOSIS — I130 Hypertensive heart and chronic kidney disease with heart failure and stage 1 through stage 4 chronic kidney disease, or unspecified chronic kidney disease: Secondary | ICD-10-CM | POA: Diagnosis not present

## 2018-08-26 DIAGNOSIS — K219 Gastro-esophageal reflux disease without esophagitis: Secondary | ICD-10-CM | POA: Diagnosis not present

## 2018-08-26 DIAGNOSIS — Z87891 Personal history of nicotine dependence: Secondary | ICD-10-CM | POA: Diagnosis not present

## 2018-08-26 DIAGNOSIS — T84028D Dislocation of other internal joint prosthesis, subsequent encounter: Secondary | ICD-10-CM | POA: Diagnosis not present

## 2018-08-26 DIAGNOSIS — I509 Heart failure, unspecified: Secondary | ICD-10-CM | POA: Diagnosis not present

## 2018-08-26 DIAGNOSIS — G4733 Obstructive sleep apnea (adult) (pediatric): Secondary | ICD-10-CM | POA: Diagnosis not present

## 2018-08-26 DIAGNOSIS — E1122 Type 2 diabetes mellitus with diabetic chronic kidney disease: Secondary | ICD-10-CM | POA: Diagnosis not present

## 2018-08-26 DIAGNOSIS — T84012D Broken internal right knee prosthesis, subsequent encounter: Secondary | ICD-10-CM | POA: Diagnosis not present

## 2018-08-26 DIAGNOSIS — E785 Hyperlipidemia, unspecified: Secondary | ICD-10-CM | POA: Diagnosis not present

## 2018-08-26 DIAGNOSIS — N182 Chronic kidney disease, stage 2 (mild): Secondary | ICD-10-CM | POA: Diagnosis not present

## 2018-08-26 DIAGNOSIS — E1140 Type 2 diabetes mellitus with diabetic neuropathy, unspecified: Secondary | ICD-10-CM | POA: Diagnosis not present

## 2018-08-26 NOTE — Telephone Encounter (Signed)
[name] with [facility] called to request VO for the following:  HH PT  2x a week for 4 weeks.  It is okay to start using heat on the knee.  CB#[phone].  Thank you

## 2018-08-26 NOTE — Telephone Encounter (Signed)
Please advise. Last refilled on 11/7, 11/19, and 12/5 qty #28 for 9 days

## 2018-08-26 NOTE — Telephone Encounter (Signed)
Patient called to request an RX refill on her Oxycodone.  CB#[phone].  Thank you.

## 2018-08-27 ENCOUNTER — Telehealth (INDEPENDENT_AMBULATORY_CARE_PROVIDER_SITE_OTHER): Payer: Self-pay

## 2018-08-27 ENCOUNTER — Other Ambulatory Visit (INDEPENDENT_AMBULATORY_CARE_PROVIDER_SITE_OTHER): Payer: Self-pay | Admitting: Physician Assistant

## 2018-08-27 MED ORDER — OXYCODONE-ACETAMINOPHEN 10-325 MG PO TABS: 1.0000 | ORAL_TABLET | Freq: Three times a day (TID) | ORAL | 0 refills | Status: DC | PRN

## 2018-08-27 NOTE — Telephone Encounter (Signed)
Called pt to advise that rx is at the desk for pick up.

## 2018-08-27 NOTE — Telephone Encounter (Signed)
Refilled

## 2018-08-27 NOTE — Telephone Encounter (Signed)
I called and sw [name] to advise verbal ok for orders below.

## 2018-08-31 DIAGNOSIS — I509 Heart failure, unspecified: Secondary | ICD-10-CM | POA: Diagnosis not present

## 2018-08-31 DIAGNOSIS — E1122 Type 2 diabetes mellitus with diabetic chronic kidney disease: Secondary | ICD-10-CM | POA: Diagnosis not present

## 2018-08-31 DIAGNOSIS — K219 Gastro-esophageal reflux disease without esophagitis: Secondary | ICD-10-CM | POA: Diagnosis not present

## 2018-08-31 DIAGNOSIS — G4733 Obstructive sleep apnea (adult) (pediatric): Secondary | ICD-10-CM | POA: Diagnosis not present

## 2018-08-31 DIAGNOSIS — Z87891 Personal history of nicotine dependence: Secondary | ICD-10-CM | POA: Diagnosis not present

## 2018-08-31 DIAGNOSIS — N182 Chronic kidney disease, stage 2 (mild): Secondary | ICD-10-CM | POA: Diagnosis not present

## 2018-08-31 DIAGNOSIS — T84028D Dislocation of other internal joint prosthesis, subsequent encounter: Secondary | ICD-10-CM | POA: Diagnosis not present

## 2018-08-31 DIAGNOSIS — I130 Hypertensive heart and chronic kidney disease with heart failure and stage 1 through stage 4 chronic kidney disease, or unspecified chronic kidney disease: Secondary | ICD-10-CM | POA: Diagnosis not present

## 2018-08-31 DIAGNOSIS — T84012D Broken internal right knee prosthesis, subsequent encounter: Secondary | ICD-10-CM | POA: Diagnosis not present

## 2018-08-31 DIAGNOSIS — E1140 Type 2 diabetes mellitus with diabetic neuropathy, unspecified: Secondary | ICD-10-CM | POA: Diagnosis not present

## 2018-08-31 DIAGNOSIS — E785 Hyperlipidemia, unspecified: Secondary | ICD-10-CM | POA: Diagnosis not present

## 2018-09-02 DIAGNOSIS — K219 Gastro-esophageal reflux disease without esophagitis: Secondary | ICD-10-CM | POA: Diagnosis not present

## 2018-09-02 DIAGNOSIS — E1140 Type 2 diabetes mellitus with diabetic neuropathy, unspecified: Secondary | ICD-10-CM | POA: Diagnosis not present

## 2018-09-02 DIAGNOSIS — E785 Hyperlipidemia, unspecified: Secondary | ICD-10-CM | POA: Diagnosis not present

## 2018-09-02 DIAGNOSIS — I130 Hypertensive heart and chronic kidney disease with heart failure and stage 1 through stage 4 chronic kidney disease, or unspecified chronic kidney disease: Secondary | ICD-10-CM | POA: Diagnosis not present

## 2018-09-02 DIAGNOSIS — N182 Chronic kidney disease, stage 2 (mild): Secondary | ICD-10-CM | POA: Diagnosis not present

## 2018-09-02 DIAGNOSIS — Z87891 Personal history of nicotine dependence: Secondary | ICD-10-CM | POA: Diagnosis not present

## 2018-09-02 DIAGNOSIS — I509 Heart failure, unspecified: Secondary | ICD-10-CM | POA: Diagnosis not present

## 2018-09-02 DIAGNOSIS — T84012D Broken internal right knee prosthesis, subsequent encounter: Secondary | ICD-10-CM | POA: Diagnosis not present

## 2018-09-02 DIAGNOSIS — G4733 Obstructive sleep apnea (adult) (pediatric): Secondary | ICD-10-CM | POA: Diagnosis not present

## 2018-09-02 DIAGNOSIS — T84028D Dislocation of other internal joint prosthesis, subsequent encounter: Secondary | ICD-10-CM | POA: Diagnosis not present

## 2018-09-02 DIAGNOSIS — E1122 Type 2 diabetes mellitus with diabetic chronic kidney disease: Secondary | ICD-10-CM | POA: Diagnosis not present

## 2018-09-03 ENCOUNTER — Telehealth (INDEPENDENT_AMBULATORY_CARE_PROVIDER_SITE_OTHER): Payer: Self-pay | Admitting: Orthopedic Surgery

## 2018-09-03 NOTE — Telephone Encounter (Signed)
Will have to discuss on 09/06/18. Thanks, [facility]

## 2018-09-03 NOTE — Telephone Encounter (Signed)
Please advise, patient last fill date was 08/28/18 and qty #28 for 9 days. Thank you

## 2018-09-03 NOTE — Telephone Encounter (Signed)
Patient called to request an RX refill on her Oxycodone.  CB#[phone].  Thank you.

## 2018-09-06 NOTE — Telephone Encounter (Signed)
Patient called to check on the status of medication refill  °

## 2018-09-06 NOTE — Telephone Encounter (Signed)
Pt last refill was on 08/27/18 next appt is not until 09/30/18 pt is s/p total knee revision and requesting rx for pain medication. Do you want to refill this rx?

## 2018-09-07 DIAGNOSIS — E1122 Type 2 diabetes mellitus with diabetic chronic kidney disease: Secondary | ICD-10-CM | POA: Diagnosis not present

## 2018-09-07 DIAGNOSIS — N182 Chronic kidney disease, stage 2 (mild): Secondary | ICD-10-CM | POA: Diagnosis not present

## 2018-09-07 DIAGNOSIS — T84012D Broken internal right knee prosthesis, subsequent encounter: Secondary | ICD-10-CM | POA: Diagnosis not present

## 2018-09-07 DIAGNOSIS — G4733 Obstructive sleep apnea (adult) (pediatric): Secondary | ICD-10-CM | POA: Diagnosis not present

## 2018-09-07 DIAGNOSIS — I130 Hypertensive heart and chronic kidney disease with heart failure and stage 1 through stage 4 chronic kidney disease, or unspecified chronic kidney disease: Secondary | ICD-10-CM | POA: Diagnosis not present

## 2018-09-07 DIAGNOSIS — I509 Heart failure, unspecified: Secondary | ICD-10-CM | POA: Diagnosis not present

## 2018-09-07 DIAGNOSIS — T84028D Dislocation of other internal joint prosthesis, subsequent encounter: Secondary | ICD-10-CM | POA: Diagnosis not present

## 2018-09-07 DIAGNOSIS — K219 Gastro-esophageal reflux disease without esophagitis: Secondary | ICD-10-CM | POA: Diagnosis not present

## 2018-09-07 DIAGNOSIS — E785 Hyperlipidemia, unspecified: Secondary | ICD-10-CM | POA: Diagnosis not present

## 2018-09-07 DIAGNOSIS — Z87891 Personal history of nicotine dependence: Secondary | ICD-10-CM | POA: Diagnosis not present

## 2018-09-07 DIAGNOSIS — E1140 Type 2 diabetes mellitus with diabetic neuropathy, unspecified: Secondary | ICD-10-CM | POA: Diagnosis not present

## 2018-09-09 ENCOUNTER — Other Ambulatory Visit (INDEPENDENT_AMBULATORY_CARE_PROVIDER_SITE_OTHER): Payer: Self-pay | Admitting: Physician Assistant

## 2018-09-09 DIAGNOSIS — Z87891 Personal history of nicotine dependence: Secondary | ICD-10-CM | POA: Diagnosis not present

## 2018-09-09 DIAGNOSIS — E785 Hyperlipidemia, unspecified: Secondary | ICD-10-CM | POA: Diagnosis not present

## 2018-09-09 DIAGNOSIS — G4733 Obstructive sleep apnea (adult) (pediatric): Secondary | ICD-10-CM | POA: Diagnosis not present

## 2018-09-09 DIAGNOSIS — E1122 Type 2 diabetes mellitus with diabetic chronic kidney disease: Secondary | ICD-10-CM | POA: Diagnosis not present

## 2018-09-09 DIAGNOSIS — E1140 Type 2 diabetes mellitus with diabetic neuropathy, unspecified: Secondary | ICD-10-CM | POA: Diagnosis not present

## 2018-09-09 DIAGNOSIS — T84028D Dislocation of other internal joint prosthesis, subsequent encounter: Secondary | ICD-10-CM | POA: Diagnosis not present

## 2018-09-09 DIAGNOSIS — I130 Hypertensive heart and chronic kidney disease with heart failure and stage 1 through stage 4 chronic kidney disease, or unspecified chronic kidney disease: Secondary | ICD-10-CM | POA: Diagnosis not present

## 2018-09-09 DIAGNOSIS — T84012D Broken internal right knee prosthesis, subsequent encounter: Secondary | ICD-10-CM | POA: Diagnosis not present

## 2018-09-09 DIAGNOSIS — K219 Gastro-esophageal reflux disease without esophagitis: Secondary | ICD-10-CM | POA: Diagnosis not present

## 2018-09-09 DIAGNOSIS — I509 Heart failure, unspecified: Secondary | ICD-10-CM | POA: Diagnosis not present

## 2018-09-09 DIAGNOSIS — N182 Chronic kidney disease, stage 2 (mild): Secondary | ICD-10-CM | POA: Diagnosis not present

## 2018-09-09 MED ORDER — OXYCODONE-ACETAMINOPHEN 10-325 MG PO TABS: 1.0000 | ORAL_TABLET | Freq: Three times a day (TID) | ORAL | 0 refills | Status: DC | PRN

## 2018-09-09 NOTE — Telephone Encounter (Signed)
Refilled. Will need to call and let patient know a Rx is at the front desk

## 2018-09-09 NOTE — Telephone Encounter (Signed)
Called and l/m on pt 's cell to let her know that rx is ready for her to pick up. rx is up front for pt to pick up.

## 2018-09-14 DIAGNOSIS — N182 Chronic kidney disease, stage 2 (mild): Secondary | ICD-10-CM | POA: Diagnosis not present

## 2018-09-14 DIAGNOSIS — E1140 Type 2 diabetes mellitus with diabetic neuropathy, unspecified: Secondary | ICD-10-CM | POA: Diagnosis not present

## 2018-09-14 DIAGNOSIS — T84028D Dislocation of other internal joint prosthesis, subsequent encounter: Secondary | ICD-10-CM | POA: Diagnosis not present

## 2018-09-14 DIAGNOSIS — G4733 Obstructive sleep apnea (adult) (pediatric): Secondary | ICD-10-CM | POA: Diagnosis not present

## 2018-09-14 DIAGNOSIS — E1122 Type 2 diabetes mellitus with diabetic chronic kidney disease: Secondary | ICD-10-CM | POA: Diagnosis not present

## 2018-09-14 DIAGNOSIS — I509 Heart failure, unspecified: Secondary | ICD-10-CM | POA: Diagnosis not present

## 2018-09-14 DIAGNOSIS — E785 Hyperlipidemia, unspecified: Secondary | ICD-10-CM | POA: Diagnosis not present

## 2018-09-14 DIAGNOSIS — K219 Gastro-esophageal reflux disease without esophagitis: Secondary | ICD-10-CM | POA: Diagnosis not present

## 2018-09-14 DIAGNOSIS — T84012D Broken internal right knee prosthesis, subsequent encounter: Secondary | ICD-10-CM | POA: Diagnosis not present

## 2018-09-14 DIAGNOSIS — Z87891 Personal history of nicotine dependence: Secondary | ICD-10-CM | POA: Diagnosis not present

## 2018-09-14 DIAGNOSIS — I130 Hypertensive heart and chronic kidney disease with heart failure and stage 1 through stage 4 chronic kidney disease, or unspecified chronic kidney disease: Secondary | ICD-10-CM | POA: Diagnosis not present

## 2018-09-16 DIAGNOSIS — S83104A Unspecified dislocation of right knee, initial encounter: Secondary | ICD-10-CM | POA: Diagnosis not present

## 2018-09-16 DIAGNOSIS — M1710 Unilateral primary osteoarthritis, unspecified knee: Secondary | ICD-10-CM | POA: Diagnosis not present

## 2018-09-17 ENCOUNTER — Telehealth (INDEPENDENT_AMBULATORY_CARE_PROVIDER_SITE_OTHER): Payer: Self-pay | Admitting: Orthopedic Surgery

## 2018-09-17 DIAGNOSIS — I509 Heart failure, unspecified: Secondary | ICD-10-CM | POA: Diagnosis not present

## 2018-09-17 DIAGNOSIS — T84012D Broken internal right knee prosthesis, subsequent encounter: Secondary | ICD-10-CM | POA: Diagnosis not present

## 2018-09-17 DIAGNOSIS — Z87891 Personal history of nicotine dependence: Secondary | ICD-10-CM | POA: Diagnosis not present

## 2018-09-17 DIAGNOSIS — N182 Chronic kidney disease, stage 2 (mild): Secondary | ICD-10-CM | POA: Diagnosis not present

## 2018-09-17 DIAGNOSIS — E1140 Type 2 diabetes mellitus with diabetic neuropathy, unspecified: Secondary | ICD-10-CM | POA: Diagnosis not present

## 2018-09-17 DIAGNOSIS — K219 Gastro-esophageal reflux disease without esophagitis: Secondary | ICD-10-CM | POA: Diagnosis not present

## 2018-09-17 DIAGNOSIS — E1122 Type 2 diabetes mellitus with diabetic chronic kidney disease: Secondary | ICD-10-CM | POA: Diagnosis not present

## 2018-09-17 DIAGNOSIS — G4733 Obstructive sleep apnea (adult) (pediatric): Secondary | ICD-10-CM | POA: Diagnosis not present

## 2018-09-17 DIAGNOSIS — T84028D Dislocation of other internal joint prosthesis, subsequent encounter: Secondary | ICD-10-CM | POA: Diagnosis not present

## 2018-09-17 DIAGNOSIS — I130 Hypertensive heart and chronic kidney disease with heart failure and stage 1 through stage 4 chronic kidney disease, or unspecified chronic kidney disease: Secondary | ICD-10-CM | POA: Diagnosis not present

## 2018-09-17 DIAGNOSIS — E785 Hyperlipidemia, unspecified: Secondary | ICD-10-CM | POA: Diagnosis not present

## 2018-09-17 NOTE — Telephone Encounter (Signed)
She needs to wait until next week. She is too early.

## 2018-09-17 NOTE — Telephone Encounter (Signed)
I called and [name] and she states that she was not trying to get the refill for today she was calling early to make sure thatit could be ready for next week and really wanted to make sure that I documented this in her chart. I advised I understood and would hold this message for Dr. Sharol Given to address on Monday.

## 2018-09-17 NOTE — Telephone Encounter (Signed)
Patient called to stated needed refill of Oxycodone.  Please call patient to advise [phone]

## 2018-09-17 NOTE — Telephone Encounter (Signed)
Pt is calling and requesting a refill on her percocet. She was gien a 9 day supply on 09/09/18. Please advise.

## 2018-09-20 ENCOUNTER — Telehealth (INDEPENDENT_AMBULATORY_CARE_PROVIDER_SITE_OTHER): Payer: Self-pay | Admitting: *Deleted

## 2018-09-20 NOTE — Telephone Encounter (Signed)
Referral has been faxed to [facility] pain clinic

## 2018-09-20 NOTE — Telephone Encounter (Signed)
-----   Message from Pamella Pert, [place] sent at 09/17/2018  3:52 PM EST ----- Regarding: FW: declined referral Thank you! That would be great! ----- Message ----- From: Maureen Chatters, CMA Sent: 09/17/2018   3:47 PM EST To: Pamella Pert, RMA Subject: RE: declined referral                          I can try to send her to [facility] Pain clinic..they seem to get pts in quickly. ----- Message ----- From: Pamella Pert, RMA Sent: 09/17/2018   3:33 PM EST To: Maureen Chatters, CMA Subject: FW: declined referral                          It appears we have referred the I pt to a pain management clinic and it did not work out I am not sure if they declined or if the pt had been seen there before. Is there any other facility that we can refer the pt to?  ----- Message ----- From: Milas Gain, PA-C Sent: 09/17/2018   3:24 PM EST To: Pamella Pert, RMA Subject: FW: declined referral                          Is there any other pain management clinic we can refer Hart Robinsons to? ----- Message ----- From: Roetta Sessions Sent: 09/17/2018   2:21 PM EST To: Neta Mends [place], PA-C Subject: declined referral                              Shawn Per clinical review nothing further we could offer patient.  We do not accept just medication management we focus on therapy or procedural intervention.  Thanks for the referral Zorita Pang

## 2018-09-20 NOTE — Telephone Encounter (Signed)
Please see message below and advise.

## 2018-09-21 DIAGNOSIS — I509 Heart failure, unspecified: Secondary | ICD-10-CM | POA: Diagnosis not present

## 2018-09-21 DIAGNOSIS — Z87891 Personal history of nicotine dependence: Secondary | ICD-10-CM | POA: Diagnosis not present

## 2018-09-21 DIAGNOSIS — T84028D Dislocation of other internal joint prosthesis, subsequent encounter: Secondary | ICD-10-CM | POA: Diagnosis not present

## 2018-09-21 DIAGNOSIS — G4733 Obstructive sleep apnea (adult) (pediatric): Secondary | ICD-10-CM | POA: Diagnosis not present

## 2018-09-21 DIAGNOSIS — E1122 Type 2 diabetes mellitus with diabetic chronic kidney disease: Secondary | ICD-10-CM | POA: Diagnosis not present

## 2018-09-21 DIAGNOSIS — I130 Hypertensive heart and chronic kidney disease with heart failure and stage 1 through stage 4 chronic kidney disease, or unspecified chronic kidney disease: Secondary | ICD-10-CM | POA: Diagnosis not present

## 2018-09-21 DIAGNOSIS — E1140 Type 2 diabetes mellitus with diabetic neuropathy, unspecified: Secondary | ICD-10-CM | POA: Diagnosis not present

## 2018-09-21 DIAGNOSIS — N182 Chronic kidney disease, stage 2 (mild): Secondary | ICD-10-CM | POA: Diagnosis not present

## 2018-09-21 DIAGNOSIS — K219 Gastro-esophageal reflux disease without esophagitis: Secondary | ICD-10-CM | POA: Diagnosis not present

## 2018-09-21 DIAGNOSIS — T84012D Broken internal right knee prosthesis, subsequent encounter: Secondary | ICD-10-CM | POA: Diagnosis not present

## 2018-09-21 DIAGNOSIS — E785 Hyperlipidemia, unspecified: Secondary | ICD-10-CM | POA: Diagnosis not present

## 2018-09-23 ENCOUNTER — Other Ambulatory Visit (INDEPENDENT_AMBULATORY_CARE_PROVIDER_SITE_OTHER): Payer: Self-pay | Admitting: Orthopedic Surgery

## 2018-09-23 MED ORDER — OXYCODONE-ACETAMINOPHEN 10-325 MG PO TABS: 1.0000 | ORAL_TABLET | Freq: Three times a day (TID) | ORAL | 0 refills | Status: DC | PRN

## 2018-09-23 NOTE — Telephone Encounter (Signed)
Called pt and advised rx has been sent to pharmacy.

## 2018-09-23 NOTE — Telephone Encounter (Signed)
rx sent

## 2018-09-24 DIAGNOSIS — Z87891 Personal history of nicotine dependence: Secondary | ICD-10-CM | POA: Diagnosis not present

## 2018-09-24 DIAGNOSIS — I509 Heart failure, unspecified: Secondary | ICD-10-CM | POA: Diagnosis not present

## 2018-09-24 DIAGNOSIS — N182 Chronic kidney disease, stage 2 (mild): Secondary | ICD-10-CM | POA: Diagnosis not present

## 2018-09-24 DIAGNOSIS — K219 Gastro-esophageal reflux disease without esophagitis: Secondary | ICD-10-CM | POA: Diagnosis not present

## 2018-09-24 DIAGNOSIS — E1140 Type 2 diabetes mellitus with diabetic neuropathy, unspecified: Secondary | ICD-10-CM | POA: Diagnosis not present

## 2018-09-24 DIAGNOSIS — T84012D Broken internal right knee prosthesis, subsequent encounter: Secondary | ICD-10-CM | POA: Diagnosis not present

## 2018-09-24 DIAGNOSIS — I130 Hypertensive heart and chronic kidney disease with heart failure and stage 1 through stage 4 chronic kidney disease, or unspecified chronic kidney disease: Secondary | ICD-10-CM | POA: Diagnosis not present

## 2018-09-24 DIAGNOSIS — M129 Arthropathy, unspecified: Secondary | ICD-10-CM | POA: Diagnosis not present

## 2018-09-24 DIAGNOSIS — Z79899 Other long term (current) drug therapy: Secondary | ICD-10-CM | POA: Diagnosis not present

## 2018-09-24 DIAGNOSIS — G4733 Obstructive sleep apnea (adult) (pediatric): Secondary | ICD-10-CM | POA: Diagnosis not present

## 2018-09-24 DIAGNOSIS — M542 Cervicalgia: Secondary | ICD-10-CM | POA: Diagnosis not present

## 2018-09-24 DIAGNOSIS — T84028D Dislocation of other internal joint prosthesis, subsequent encounter: Secondary | ICD-10-CM | POA: Diagnosis not present

## 2018-09-24 DIAGNOSIS — M546 Pain in thoracic spine: Secondary | ICD-10-CM | POA: Diagnosis not present

## 2018-09-24 DIAGNOSIS — M25512 Pain in left shoulder: Secondary | ICD-10-CM | POA: Diagnosis not present

## 2018-09-24 DIAGNOSIS — M545 Low back pain: Secondary | ICD-10-CM | POA: Diagnosis not present

## 2018-09-24 DIAGNOSIS — E1122 Type 2 diabetes mellitus with diabetic chronic kidney disease: Secondary | ICD-10-CM | POA: Diagnosis not present

## 2018-09-24 DIAGNOSIS — E785 Hyperlipidemia, unspecified: Secondary | ICD-10-CM | POA: Diagnosis not present

## 2018-09-26 ENCOUNTER — Emergency Department (HOSPITAL_COMMUNITY)
Admission: EM | Admit: 2018-09-26 | Discharge: 2018-09-27 | Disposition: A | Discharge status: Home / Self Care | Payer: Medicare Other | Attending: Emergency Medicine | Admitting: Emergency Medicine

## 2018-09-26 ENCOUNTER — Emergency Department (HOSPITAL_COMMUNITY): Payer: Medicare Other

## 2018-09-26 DIAGNOSIS — S99911A Unspecified injury of right ankle, initial encounter: Secondary | ICD-10-CM | POA: Diagnosis not present

## 2018-09-26 DIAGNOSIS — F17210 Nicotine dependence, cigarettes, uncomplicated: Secondary | ICD-10-CM | POA: Diagnosis not present

## 2018-09-26 DIAGNOSIS — S93401A Sprain of unspecified ligament of right ankle, initial encounter: Secondary | ICD-10-CM | POA: Diagnosis not present

## 2018-09-26 DIAGNOSIS — N182 Chronic kidney disease, stage 2 (mild): Secondary | ICD-10-CM | POA: Diagnosis not present

## 2018-09-26 DIAGNOSIS — R52 Pain, unspecified: Secondary | ICD-10-CM | POA: Diagnosis not present

## 2018-09-26 DIAGNOSIS — M7989 Other specified soft tissue disorders: Secondary | ICD-10-CM | POA: Diagnosis not present

## 2018-09-26 DIAGNOSIS — S99921A Unspecified injury of right foot, initial encounter: Secondary | ICD-10-CM | POA: Diagnosis not present

## 2018-09-26 DIAGNOSIS — Y9389 Activity, other specified: Secondary | ICD-10-CM | POA: Diagnosis not present

## 2018-09-26 DIAGNOSIS — Y998 Other external cause status: Secondary | ICD-10-CM | POA: Insufficient documentation

## 2018-09-26 DIAGNOSIS — I509 Heart failure, unspecified: Secondary | ICD-10-CM | POA: Insufficient documentation

## 2018-09-26 DIAGNOSIS — Z79899 Other long term (current) drug therapy: Secondary | ICD-10-CM | POA: Insufficient documentation

## 2018-09-26 DIAGNOSIS — S93601A Unspecified sprain of right foot, initial encounter: Secondary | ICD-10-CM | POA: Diagnosis not present

## 2018-09-26 DIAGNOSIS — Y92010 Kitchen of single-family (private) house as the place of occurrence of the external cause: Secondary | ICD-10-CM | POA: Insufficient documentation

## 2018-09-26 DIAGNOSIS — E1122 Type 2 diabetes mellitus with diabetic chronic kidney disease: Secondary | ICD-10-CM | POA: Insufficient documentation

## 2018-09-26 DIAGNOSIS — I130 Hypertensive heart and chronic kidney disease with heart failure and stage 1 through stage 4 chronic kidney disease, or unspecified chronic kidney disease: Secondary | ICD-10-CM | POA: Diagnosis not present

## 2018-09-26 DIAGNOSIS — Z7982 Long term (current) use of aspirin: Secondary | ICD-10-CM | POA: Insufficient documentation

## 2018-09-26 DIAGNOSIS — E1165 Type 2 diabetes mellitus with hyperglycemia: Secondary | ICD-10-CM | POA: Diagnosis not present

## 2018-09-26 NOTE — ED Notes (Signed)
ED Provider at bedside. 

## 2018-09-26 NOTE — ED Triage Notes (Signed)
Pt transported from home by EMS, pt states yesterday she accidentally hit right foot on wall when she pressed FWD on electric wheelchair and accelerated quickly. Pt is in knee immobilizer on R leg from recent surgery.  Swelling noted, decreased pulses

## 2018-09-26 NOTE — ED Notes (Signed)
Patient transported to X-ray 

## 2018-09-27 ENCOUNTER — Emergency Department (HOSPITAL_COMMUNITY): Payer: Medicare Other

## 2018-09-27 DIAGNOSIS — Z743 Need for continuous supervision: Secondary | ICD-10-CM | POA: Diagnosis not present

## 2018-09-27 DIAGNOSIS — M7989 Other specified soft tissue disorders: Secondary | ICD-10-CM | POA: Diagnosis not present

## 2018-09-27 DIAGNOSIS — S99921A Unspecified injury of right foot, initial encounter: Secondary | ICD-10-CM | POA: Diagnosis not present

## 2018-09-27 DIAGNOSIS — R279 Unspecified lack of coordination: Secondary | ICD-10-CM | POA: Diagnosis not present

## 2018-09-27 DIAGNOSIS — S93401A Sprain of unspecified ligament of right ankle, initial encounter: Secondary | ICD-10-CM | POA: Diagnosis not present

## 2018-09-27 MED ORDER — MELOXICAM 15 MG PO TABS: 15.0000 mg | ORAL_TABLET | Freq: Every day | ORAL | 0 refills | Status: DC

## 2018-09-27 MED ORDER — KETOROLAC TROMETHAMINE 30 MG/ML IJ SOLN
30.0000 mg | Freq: Once | INTRAMUSCULAR | Status: AC
  Administered 2018-09-27: 30 mg via INTRAMUSCULAR
  Filled 2018-09-27: qty 1

## 2018-09-27 NOTE — ED Notes (Signed)
PTAR after 0430

## 2018-09-27 NOTE — ED Notes (Signed)
Patient transported to X-ray 

## 2018-09-27 NOTE — ED Provider Notes (Signed)
[name]   CSN: [id number] Arrival date & time: 09/26/18  2305     History   Chief Complaint Chief Complaint  Patient presents with  . Foot Injury    HPI [name] is a 53 y.o. female.  Patient presents to the emergency department for evaluation of right foot and ankle injury.  Patient reports that [name] accidentally drove her motorized wheelchair into the wall in her kitchen yesterday.  Patient is chronically on oxycodone/apap 10/325 for her chronic pain issues, this has not been controlling her foot and ankle pain.     Past Medical History:  Diagnosis Date  . Arthritis    "hands, arms, feet, back, knees" (02/17/2018)  . Cardiomyopathy [facility])    From Dr. Bonney Roussel office notes from 01/13/18  . CHF (congestive heart failure) ([place])   . Chronic lower back pain   . Diabetic neuropathy ([place])   . Family history of adverse reaction to anesthesia    Mother is hard to arouse and blood pressure drops  . GERD (gastroesophageal reflux disease)   . High cholesterol   . Hypertension   . Sleep apnea    "need new machine"  - does not use a cpap (02/17/2018)  . Type II diabetes mellitus ([place])    no longer on medications (02/17/2018)    Patient Active Problem List   Diagnosis Date Noted  . S/P revision of total knee, right 05/14/2018  . Dislocated knee, right, sequela   . S/P revision of total knee 02/17/2018  . Failed total right knee replacement ([place])   . Chronic renal insufficiency, stage 2 (mild) 11/10/2017  . Acute CHF (congestive heart failure) ([place]) 11/10/2017  . Failed total knee arthroplasty, sequela 08/31/2017  . Lower extremity cellulitis 01/03/2013  . Diabetes mellitus ([place]) 01/03/2013  . Morbid (severe) obesity due to excess calories ([place]) 11/12/2006  . TOBACCO DEPENDENCE 11/12/2006  . DEPRESSIVE DISORDER, NOS 11/12/2006  . HYPERTENSION, BENIGN SYSTEMIC 11/12/2006  . GASTROESOPHAGEAL REFLUX, NO ESOPHAGITIS  11/12/2006  . ACNE 11/12/2006  . OSTEOARTHRITIS, LOWER LEG 11/12/2006  . APNEA, SLEEP 11/12/2006    Past Surgical History:  Procedure Laterality Date  . ANTERIOR CERVICAL DECOMP/DISCECTOMY FUSION  03/06/2010   C4-7 w/corpectomy/notes 03/17/2010  . BACK SURGERY    . CARPAL TUNNEL RELEASE Right   . HARDWARE REVISION  04/10/2010   cervical hardware revision screw replacement C4/notes 04/12/2010  . JOINT REPLACEMENT    . LAPAROSCOPIC CHOLECYSTECTOMY  1999  . LUMBAR LAMINECTOMY/DECOMPRESSION MICRODISCECTOMY  09/2010   Archie Endo 10/05/2010  . MULTIPLE EXTRACTIONS WITH ALVEOLOPLASTY [name] 01/19/2015   Procedure: MULTIPLE EXTRACTIONS Three, Four, Five, Seven, eight, nine, ten, eleven, twelve, fourteen, eighteen, twenty-one, twenty-eight, twenty-nine, thirty-one with Alveoloplasty.  ;  Surgeon: Diona Browner, DDS;  Location: [place];  Service: Oral Surgery;  Laterality: [name];  . REPLACEMENT TOTAL KNEE BILATERAL Bilateral 10/2007-03/2008   "left-right"  . TOENAIL EXCISION Bilateral    great toes  . TONSILLECTOMY    . TOTAL KNEE REVISION Right 02/17/2018  . TOTAL KNEE REVISION Right 02/17/2018   Procedure: RIGHT TOTAL KNEE REVISION;  Surgeon: Newt Minion, MD;  Location: [place];  Service: Orthopedics;  Laterality: Right;  . TOTAL KNEE REVISION Right 05/14/2018   Procedure: RIGHT TOTAL KNEE REVISION TO HINGED KNEE;  Surgeon: Newt Minion, MD;  Location: [place];  Service: Orthopedics;  Laterality: Right;     OB History   No obstetric history on file.  Home Medications    Prior to Admission medications   Medication Sig Start Date End Date Taking? Authorizing Provider  aspirin EC 325 MG EC tablet Take 1 tablet (325 mg total) by mouth daily with breakfast. 05/17/18   Newt Minion, MD  atorvastatin (LIPITOR) 40 MG tablet Take 40 mg by mouth daily.    [provider]  furosemide (LASIX) 40 MG tablet Take 1 tablet (40 mg total) by mouth daily. Patient taking differently: Take 20 mg by mouth  daily.  11/14/17   Velvet Bathe, MD  lisinopril-hydrochlorothiazide (PRINZIDE,ZESTORETIC) 20-25 MG tablet Take 1 tablet by mouth daily. 01/21/18   [provider]  meloxicam (MOBIC) 15 MG tablet Take 1 tablet (15 mg total) by mouth daily. 09/27/18   Orpah Greek, MD  metoprolol succinate (TOPROL-XL) 25 MG 24 hr tablet Take 37.5 mg by mouth daily.    [provider]  oxyCODONE-acetaminophen (PERCOCET) 10-325 MG tablet Take 1 tablet by mouth every 8 (eight) hours as needed for pain. 09/23/18   Newt Minion, MD  sacubitril-valsartan (ENTRESTO) 24-26 MG Take 1 tablet by mouth 2 (two) times daily.     [provider]    Family History Family History  Problem Relation Age of Onset  . Kidney disease Mother   . Congestive Heart Failure Mother   . Hypertension Father     Social History Social History   Tobacco Use  . Smoking status: Current Every Day Smoker    Packs/day: 0.50    Years: 32.00    Pack years: 16.00    Types: Cigarettes  . Smokeless tobacco: Never Used  Substance Use Topics  . Alcohol use: Not Currently  . Drug use: Not Currently    Types: Marijuana     Allergies   Patient has no known allergies.   Review of Systems Review of Systems  Musculoskeletal: Positive for arthralgias.  All other systems reviewed and are negative.    Physical Exam Updated Vital Signs BP 131/71   Pulse (!) 103   Temp 98.7 F (37.1 C) (Oral)   Resp 18   Ht 5\' 4"  (1.626 m)   Wt (!) 152.4 kg   LMP 11/09/2012   SpO2 100%   BMI 57.67 kg/m   Physical Exam Vitals signs and nursing [name] reviewed.  Constitutional:      Appearance: Normal appearance.  Pulmonary:     Effort: Pulmonary effort is normal.  Musculoskeletal:     Right ankle: Tenderness. Lateral malleolus tenderness found.     Right foot: Tenderness (Dorsal midfoot) present.  Skin:    General: Skin is warm and dry.     Findings: No bruising.  Neurological:     Mental Status: [name] is  alert.     Sensory: No sensory deficit.      ED Treatments / Results  Labs (all labs ordered are listed, but only abnormal results are displayed) Labs Reviewed - No data to display  EKG None  Radiology Dg Ankle Complete Right  Result Date: 09/27/2018 CLINICAL DATA:  Injury to the right foot yesterday. Swelling and decreased pulses. Recent surgery to the right leg in a knee immobilizer. EXAM: RIGHT ANKLE - COMPLETE 3+ VIEW; RIGHT FOOT COMPLETE - 3+ VIEW COMPARISON:  Right foot 03/04/2011 FINDINGS: Three views of the right foot and three views of the right ankle are obtained. There is diffuse bone demineralization. Mild hallux valgus deformity. Degenerative changes in the intertarsal joints and in the ankle joint. Visualization is  limited due to an overlying pad, but there is suggestion of fragmentation of the superior proximal calcaneus which could represent nondisplaced fracture. Calcaneal views are suggested. Otherwise, no acute fractures or dislocation identified. Diffuse soft tissue swelling of the right lower leg with diffuse soft tissue calcifications, likely due to venous stasis and dystrophic calcification. IMPRESSION: Degenerative changes in the right foot and ankle. Possible nondisplaced fracture of the superior calcaneus, incompletely evaluated due to artifact. Soft tissue swelling and calcification. Electronically Signed   By: Lucienne Capers M.D.   On: 09/27/2018 00:08   Dg Os Calcis Right  Result Date: 09/27/2018 CLINICAL DATA:  53 y/o F; injured foot running into a wall with wheelchair. Swelling of right foot and ankle. EXAM: RIGHT OS CALCIS - 2+ VIEW COMPARISON:  None. FINDINGS: There is no evidence of fracture or other focal bone lesions. Soft tissues are unremarkable. IMPRESSION: No appreciable calcaneal fracture. Electronically Signed   By: Kristine Garbe M.D.   On: 09/27/2018 00:52   Dg Foot Complete Right  Result Date: 09/27/2018 CLINICAL DATA:  Injury to the  right foot yesterday. Swelling and decreased pulses. Recent surgery to the right leg in a knee immobilizer. EXAM: RIGHT ANKLE - COMPLETE 3+ VIEW; RIGHT FOOT COMPLETE - 3+ VIEW COMPARISON:  Right foot 03/04/2011 FINDINGS: Three views of the right foot and three views of the right ankle are obtained. There is diffuse bone demineralization. Mild hallux valgus deformity. Degenerative changes in the intertarsal joints and in the ankle joint. Visualization is limited due to an overlying pad, but there is suggestion of fragmentation of the superior proximal calcaneus which could represent nondisplaced fracture. Calcaneal views are suggested. Otherwise, no acute fractures or dislocation identified. Diffuse soft tissue swelling of the right lower leg with diffuse soft tissue calcifications, likely due to venous stasis and dystrophic calcification. IMPRESSION: Degenerative changes in the right foot and ankle. Possible nondisplaced fracture of the superior calcaneus, incompletely evaluated due to artifact. Soft tissue swelling and calcification. Electronically Signed   By: Lucienne Capers M.D.   On: 09/27/2018 00:08    Procedures Procedures (including critical care time)  Medications Ordered in ED Medications  ketorolac (TORADOL) 30 MG/ML injection 30 mg (has no administration in time range)     Initial Impression / Assessment and Plan / ED Course  I have reviewed the triage vital signs and the nursing notes.  Pertinent labs & imaging results that were available during my care of the patient were reviewed by me and considered in my medical decision making (see chart for details).     Presents for evaluation of right foot and ankle injury.  Patient had blunt trauma to the area when [name] accidentally hit her foot on the wall when her motorized wheelchair moved.  Injury occurred yesterday.  No evidence of significant bruising, no skin changes to suggest infection.  X-ray does not show any acute fracture.  Pain  management will be difficult as the patient is already taking high-dose oxycodone.  We will try to add Mobic to her normal pain regimen.  Final Clinical Impressions(s) / ED Diagnoses   Final diagnoses:  Foot sprain, right, initial encounter  Sprain of right ankle, unspecified ligament, initial encounter    ED Discharge Orders         Ordered    meloxicam (MOBIC) 15 MG tablet  Daily     09/27/18 0106           Orpah Greek, MD 09/27/18 0106

## 2018-09-27 NOTE — ED Notes (Signed)
Pt departed in NAD, and in care of [facility] crew.

## 2018-09-28 DIAGNOSIS — M25572 Pain in left ankle and joints of left foot: Secondary | ICD-10-CM | POA: Diagnosis not present

## 2018-09-28 DIAGNOSIS — I10 Essential (primary) hypertension: Secondary | ICD-10-CM | POA: Diagnosis not present

## 2018-09-28 DIAGNOSIS — M25512 Pain in left shoulder: Secondary | ICD-10-CM | POA: Diagnosis not present

## 2018-09-28 DIAGNOSIS — M25571 Pain in right ankle and joints of right foot: Secondary | ICD-10-CM | POA: Diagnosis not present

## 2018-09-28 DIAGNOSIS — Z96651 Presence of right artificial knee joint: Secondary | ICD-10-CM | POA: Diagnosis not present

## 2018-09-30 ENCOUNTER — Other Ambulatory Visit: Payer: Self-pay

## 2018-09-30 ENCOUNTER — Ambulatory Visit (INDEPENDENT_AMBULATORY_CARE_PROVIDER_SITE_OTHER): Payer: Medicare Other | Admitting: Orthopedic Surgery

## 2018-09-30 ENCOUNTER — Encounter (INDEPENDENT_AMBULATORY_CARE_PROVIDER_SITE_OTHER): Payer: Self-pay | Admitting: Physician Assistant

## 2018-09-30 VITALS — Ht 64.0 in | Wt 336.0 lb

## 2018-09-30 DIAGNOSIS — Z96651 Presence of right artificial knee joint: Secondary | ICD-10-CM

## 2018-09-30 DIAGNOSIS — E43 Unspecified severe protein-calorie malnutrition: Secondary | ICD-10-CM | POA: Diagnosis not present

## 2018-09-30 DIAGNOSIS — Z6841 Body Mass Index (BMI) 40.0 and over, adult: Secondary | ICD-10-CM | POA: Diagnosis not present

## 2018-09-30 DIAGNOSIS — S93411A Sprain of calcaneofibular ligament of right ankle, initial encounter: Secondary | ICD-10-CM | POA: Diagnosis not present

## 2018-09-30 MED ORDER — OXYCODONE-ACETAMINOPHEN 10-325 MG PO TABS: 1.0000 | ORAL_TABLET | Freq: Three times a day (TID) | ORAL | 0 refills | Status: DC | PRN

## 2018-09-30 NOTE — Patient Outreach (Signed)
[facility]) Care Management  09/30/2018  [name] Feb 25, 1966 [id number]   Telephone Screen  Referral Date: 09/30/2018 Referral Source: [facility] Dept. Referral Reason: " seen in ED on 09/27/2018 for sprain ankle, she would like to talk with someone regarding disability housing-feels where she stays now-not equipped to accommodate her needs" Insurance: [facility] Medicare   Outreach attempt # 1 to patient. Spoke with patient and screening completed.   Social: Patient resides in her home. She states that she has two adult daughters in the home(on who is disabled) and a grandson. She reports that she moved to her current residence in April 2019. She is paying $900/month for rent and and can not continue to afford it. Patient states that her lease will run out in April and she must provide them with a 60 day notice of leaving prior. She also states that her current residence is not handicap accessible. She reports that she uses electric wheelchair and it does not allow her to get around well. Per patient report, she is unable to get into bathroom. As a result, she used a closed in porch to take her baths/wash up ups on her shower chair and BSC.  Patient looking for assistance finding housing. She states that she was on section 8 housing but did not follow proper exit procedures and was put on probation with housing authority for five years and now her five years are up. She states that she also need food assistance. She reports she was told that her income is $1.00 over the cut off limit and she was taken off getting food stamps about two months. She voices financial hardship with obtaining food.Patient has [employer] services in the home. She voices that she uses [employer] for transportation.    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 Patient states that she has BP cuff and scale in the home and is monitoring  both of them. She denies needing further education and/or support in managing conditions. She states that she was in the ED on 09/27/2018 for a sprain ankle after her motorized chair sped up in the home and she ran into a wall.    Medications: Patient states she takes only about five meds. She has no issues with affording and/or managing meds.   Consent:[facility] services reviewed and discussed with patient. Verbal consent for services given by patient. Patient voices only needing SW assistance at this time.    Plan: RN CM will send [facility] SW referral for possible housing and food resources/assistance.  Enzo Montgomery, RN,BSN,CCM [facility] Telephonic Care Management Coordinator Direct Phone: [phone] Toll Free: [phone] Fax: [phone]

## 2018-10-01 ENCOUNTER — Encounter (INDEPENDENT_AMBULATORY_CARE_PROVIDER_SITE_OTHER): Payer: Self-pay | Admitting: Orthopedic Surgery

## 2018-10-01 ENCOUNTER — Telehealth (INDEPENDENT_AMBULATORY_CARE_PROVIDER_SITE_OTHER): Payer: Self-pay | Admitting: Physician Assistant

## 2018-10-01 DIAGNOSIS — Z87891 Personal history of nicotine dependence: Secondary | ICD-10-CM | POA: Diagnosis not present

## 2018-10-01 DIAGNOSIS — E785 Hyperlipidemia, unspecified: Secondary | ICD-10-CM | POA: Diagnosis not present

## 2018-10-01 DIAGNOSIS — N182 Chronic kidney disease, stage 2 (mild): Secondary | ICD-10-CM | POA: Diagnosis not present

## 2018-10-01 DIAGNOSIS — K219 Gastro-esophageal reflux disease without esophagitis: Secondary | ICD-10-CM | POA: Diagnosis not present

## 2018-10-01 DIAGNOSIS — T84012D Broken internal right knee prosthesis, subsequent encounter: Secondary | ICD-10-CM | POA: Diagnosis not present

## 2018-10-01 DIAGNOSIS — G4733 Obstructive sleep apnea (adult) (pediatric): Secondary | ICD-10-CM | POA: Diagnosis not present

## 2018-10-01 DIAGNOSIS — I130 Hypertensive heart and chronic kidney disease with heart failure and stage 1 through stage 4 chronic kidney disease, or unspecified chronic kidney disease: Secondary | ICD-10-CM | POA: Diagnosis not present

## 2018-10-01 DIAGNOSIS — I509 Heart failure, unspecified: Secondary | ICD-10-CM | POA: Diagnosis not present

## 2018-10-01 DIAGNOSIS — E1140 Type 2 diabetes mellitus with diabetic neuropathy, unspecified: Secondary | ICD-10-CM | POA: Diagnosis not present

## 2018-10-01 DIAGNOSIS — T84028D Dislocation of other internal joint prosthesis, subsequent encounter: Secondary | ICD-10-CM | POA: Diagnosis not present

## 2018-10-01 DIAGNOSIS — E1122 Type 2 diabetes mellitus with diabetic chronic kidney disease: Secondary | ICD-10-CM | POA: Diagnosis not present

## 2018-10-01 DIAGNOSIS — Z9181 History of falling: Secondary | ICD-10-CM | POA: Diagnosis not present

## 2018-10-01 NOTE — Telephone Encounter (Signed)
Kathy/Pharmacist/[facility] called to stated that patient had Oxycodone filled with another MD on 09/28/2018 10mg s quantity of 12.& just wanted to call to make aware.  I talked with Shawn to advise and she requested the pharmacy to wait until 10/04/17 to fill. Per Juliann Pulse @ pharmacy she will hold until then.  Account noted for future purposes.

## 2018-10-01 NOTE — Progress Notes (Signed)
Office Visit Note   Patient: [name]           Date of Birth: 07-04-1966           MRN: [id number] Visit Date: 09/30/2018              Requested by: Care, [facility] [address] [place], [place] [address] PCP: Care, [facility]  Chief Complaint  Patient presents with  . Right Knee - Follow-up    05/14/18 revision total knee  . Right Ankle - Pain    ER s/p injury right ankle 09/26/2018      HPI: Patient is a 53 year old woman who presents about 4-1/2 months status post revision right total knee arthroplasty she is currently in a power wheelchair she states she uses the immobilizer for physical therapy she states her therapy is improving she is walking short distances in the house only she denies any instability with her knee.  Patient states that recently she hit the wrong switch on her wheelchair and drove directly into the wall sustaining a twisting injury to her ankle.  X-rays were obtained which shows no fracture.  Patient went to the emergency room on January 12.  Assessment & Plan: Visit Diagnoses:  1. Status post revision of total replacement of right knee   2. Body mass index 50.0-59.9, adult ([place])   3. Morbid obesity ([place])   4. Severe protein-calorie malnutrition ([place])     Plan: Patient did sprain her lateral right ankle.  Discussed this should resolve uneventfully continue using the knee immobilizer for therapy until she has sufficient quadricep strength.  Continue with therapy with [facility].  Follow-Up Instructions: No follow-ups on file.   Ortho Exam  Patient is alert, oriented, no adenopathy, well-dressed, normal affect, normal respiratory effort. Examination patient's leg is straight the incision is well-healed she has no pain with passive range of motion of the knee.  There is no cellulitis.  She has venous swelling of both lower extremities.  Patient's ankle has tenderness to palpation over the lateral  joint line anterior drawer is stable no instability of the ankle.  Imaging: No results found. No images are attached to the encounter.  Labs: Lab Results  Component Value Date   HGBA1C 7.1 (H) 05/14/2018   HGBA1C 11.3 (H) 01/03/2013   HGBA1C 8.6 (H) 03/05/2011   REPTSTATUS 04/16/2011 FINAL 04/10/2011   CULT NO GROWTH 5 DAYS 04/10/2011     Lab Results  Component Value Date   ALBUMIN 2.9 (L) 02/10/2018   ALBUMIN 2.5 (L) 11/10/2017   ALBUMIN 3.0 (L) 05/14/2014    Body mass index is 57.67 kg/m.  Orders:  No orders of the defined types were placed in this encounter.  Meds ordered this encounter  Medications  . oxyCODONE-acetaminophen (PERCOCET) 10-325 MG tablet    Sig: Take 1 tablet by mouth every 8 (eight) hours as needed for pain.    Dispense:  28 tablet    Refill:  0     Procedures: No procedures performed  Clinical Data: No additional findings.  ROS:  All other systems negative, except as noted in the HPI. Review of Systems  Objective: Vital Signs: Ht 5\' 4"  (1.626 m)   Wt (!) 336 lb (152.4 kg)   LMP 11/09/2012   BMI 57.67 kg/m   Specialty Comments:  No specialty comments available.  PMFS History: Patient Active Problem List   Diagnosis Date Noted  . S/P revision of  total knee, right 05/14/2018  . Dislocated knee, right, sequela   . S/P revision of total knee 02/17/2018  . Failed total right knee replacement ([place])   . Chronic renal insufficiency, stage 2 (mild) 11/10/2017  . Acute CHF (congestive heart failure) ([place]) 11/10/2017  . Failed total knee arthroplasty, sequela 08/31/2017  . Lower extremity cellulitis 01/03/2013  . Diabetes mellitus ([place]) 01/03/2013  . Morbid (severe) obesity due to excess calories ([place]) 11/12/2006  . TOBACCO DEPENDENCE 11/12/2006  . DEPRESSIVE DISORDER, NOS 11/12/2006  . HYPERTENSION, BENIGN SYSTEMIC 11/12/2006  . GASTROESOPHAGEAL REFLUX, NO ESOPHAGITIS 11/12/2006  . ACNE 11/12/2006  . OSTEOARTHRITIS, LOWER LEG  11/12/2006  . APNEA, SLEEP 11/12/2006   Past Medical History:  Diagnosis Date  . Arthritis    "hands, arms, feet, back, knees" (02/17/2018)  . Cardiomyopathy [facility])    From Dr. Bonney Roussel office notes from 01/13/18  . CHF (congestive heart failure) ([place])   . Chronic lower back pain   . Diabetic neuropathy ([place])   . Family history of adverse reaction to anesthesia    Mother is hard to arouse and blood pressure drops  . GERD (gastroesophageal reflux disease)   . High cholesterol   . Hypertension   . Sleep apnea    "need new machine"  - does not use a cpap (02/17/2018)  . Type II diabetes mellitus ([place])    no longer on medications (02/17/2018)    Family History  Problem Relation Age of Onset  . Kidney disease Mother   . Congestive Heart Failure Mother   . Hypertension Father     Past Surgical History:  Procedure Laterality Date  . ANTERIOR CERVICAL DECOMP/DISCECTOMY FUSION  03/06/2010   C4-7 w/corpectomy/notes 03/17/2010  . BACK SURGERY    . CARPAL TUNNEL RELEASE Right   . HARDWARE REVISION  04/10/2010   cervical hardware revision screw replacement C4/notes 04/12/2010  . JOINT REPLACEMENT    . LAPAROSCOPIC CHOLECYSTECTOMY  1999  . LUMBAR LAMINECTOMY/DECOMPRESSION MICRODISCECTOMY  09/2010   Archie Endo 10/05/2010  . MULTIPLE EXTRACTIONS WITH ALVEOLOPLASTY [name] 01/19/2015   Procedure: MULTIPLE EXTRACTIONS Three, Four, Five, Seven, eight, nine, ten, eleven, twelve, fourteen, eighteen, twenty-one, twenty-eight, twenty-nine, thirty-one with Alveoloplasty.  ;  Surgeon: Diona Browner, DDS;  Location: [facility];  Service: Oral Surgery;  Laterality: [name];  . REPLACEMENT TOTAL KNEE BILATERAL Bilateral 10/2007-03/2008   "left-right"  . TOENAIL EXCISION Bilateral    great toes  . TONSILLECTOMY    . TOTAL KNEE REVISION Right 02/17/2018  . TOTAL KNEE REVISION Right 02/17/2018   Procedure: RIGHT TOTAL KNEE REVISION;  Surgeon: Newt Minion, MD;  Location: [facility];  Service: Orthopedics;  Laterality: Right;  .  TOTAL KNEE REVISION Right 05/14/2018   Procedure: RIGHT TOTAL KNEE REVISION TO HINGED KNEE;  Surgeon: Newt Minion, MD;  Location: [place] [facility];  Service: Orthopedics;  Laterality: Right;   Social History   Occupational History  . Not on file  Tobacco Use  . Smoking status: Current Every Day Smoker    Packs/day: 0.50    Years: 32.00    Pack years: 16.00    Types: Cigarettes  . Smokeless tobacco: Never Used  Substance and Sexual Activity  . Alcohol use: Not Currently  . Drug use: Not Currently    Types: Marijuana  . Sexual activity: Not Currently

## 2018-10-05 ENCOUNTER — Other Ambulatory Visit: Payer: Self-pay

## 2018-10-05 DIAGNOSIS — E785 Hyperlipidemia, unspecified: Secondary | ICD-10-CM | POA: Diagnosis not present

## 2018-10-05 DIAGNOSIS — Z87891 Personal history of nicotine dependence: Secondary | ICD-10-CM | POA: Diagnosis not present

## 2018-10-05 DIAGNOSIS — E1140 Type 2 diabetes mellitus with diabetic neuropathy, unspecified: Secondary | ICD-10-CM | POA: Diagnosis not present

## 2018-10-05 DIAGNOSIS — T84012D Broken internal right knee prosthesis, subsequent encounter: Secondary | ICD-10-CM | POA: Diagnosis not present

## 2018-10-05 DIAGNOSIS — Z9181 History of falling: Secondary | ICD-10-CM | POA: Diagnosis not present

## 2018-10-05 DIAGNOSIS — I130 Hypertensive heart and chronic kidney disease with heart failure and stage 1 through stage 4 chronic kidney disease, or unspecified chronic kidney disease: Secondary | ICD-10-CM | POA: Diagnosis not present

## 2018-10-05 DIAGNOSIS — E1122 Type 2 diabetes mellitus with diabetic chronic kidney disease: Secondary | ICD-10-CM | POA: Diagnosis not present

## 2018-10-05 DIAGNOSIS — K219 Gastro-esophageal reflux disease without esophagitis: Secondary | ICD-10-CM | POA: Diagnosis not present

## 2018-10-05 DIAGNOSIS — T84028D Dislocation of other internal joint prosthesis, subsequent encounter: Secondary | ICD-10-CM | POA: Diagnosis not present

## 2018-10-05 DIAGNOSIS — N182 Chronic kidney disease, stage 2 (mild): Secondary | ICD-10-CM | POA: Diagnosis not present

## 2018-10-05 DIAGNOSIS — G4733 Obstructive sleep apnea (adult) (pediatric): Secondary | ICD-10-CM | POA: Diagnosis not present

## 2018-10-05 DIAGNOSIS — I509 Heart failure, unspecified: Secondary | ICD-10-CM | POA: Diagnosis not present

## 2018-10-05 NOTE — Patient Outreach (Signed)
[facility]) Care Management  10/05/2018  [name] 1966/05/25 [id number]  Successful outreach to the patient on today's date, HIPAA identifiers confirmed. BSW introduced self and the reason for today's call, indicating the patient had been referred for assistance with housing needs and food insecurities. The patient reports she lives in the home with her two daughters and one grandson. The patient indicates she receives disability as her source of income as does one of her children who is disabled. Her youngest daughter works for [employer].   The patient reports she has recently lost her benefits through [employer] due to an income increase that put her household over the income limit "by one dollar". The patient utilizes [employer] for transportation and would like information on food pantries that her daughter could access when off work. The patient feels she can not access food pantries due to the logistics of carrying food while in a wheelchair.  The patient also reports interested in locating alternate housing that is handicap accessible. The patient reports her current place of residence is not handicap accessible and she has recently had trouble maneuvering through doorways. The patient is currently on the section 8 wait-list but reports she was told it would be "18-24 months". The patient currently pays $900 per month in rent and is willing to pay this price if needed for a handicap accessible residence.   BSW will mail the patient a list of food pantries in [place] as well as a list of housing options with 3 bedroom under $900. BSW will outreach the patient within the next two weeks to confirm receipt of mailing.  Daneen Schick, BSW, CDP [facility] [phone]

## 2018-10-07 DIAGNOSIS — M47817 Spondylosis without myelopathy or radiculopathy, lumbosacral region: Secondary | ICD-10-CM | POA: Diagnosis not present

## 2018-10-07 DIAGNOSIS — M25512 Pain in left shoulder: Secondary | ICD-10-CM | POA: Diagnosis not present

## 2018-10-07 DIAGNOSIS — Z87891 Personal history of nicotine dependence: Secondary | ICD-10-CM | POA: Diagnosis not present

## 2018-10-07 DIAGNOSIS — M542 Cervicalgia: Secondary | ICD-10-CM | POA: Diagnosis not present

## 2018-10-07 DIAGNOSIS — M545 Low back pain: Secondary | ICD-10-CM | POA: Diagnosis not present

## 2018-10-07 DIAGNOSIS — M546 Pain in thoracic spine: Secondary | ICD-10-CM | POA: Diagnosis not present

## 2018-10-08 DIAGNOSIS — Z87891 Personal history of nicotine dependence: Secondary | ICD-10-CM | POA: Diagnosis not present

## 2018-10-08 DIAGNOSIS — Z9181 History of falling: Secondary | ICD-10-CM | POA: Diagnosis not present

## 2018-10-08 DIAGNOSIS — G4733 Obstructive sleep apnea (adult) (pediatric): Secondary | ICD-10-CM | POA: Diagnosis not present

## 2018-10-08 DIAGNOSIS — E1122 Type 2 diabetes mellitus with diabetic chronic kidney disease: Secondary | ICD-10-CM | POA: Diagnosis not present

## 2018-10-08 DIAGNOSIS — I509 Heart failure, unspecified: Secondary | ICD-10-CM | POA: Diagnosis not present

## 2018-10-08 DIAGNOSIS — N182 Chronic kidney disease, stage 2 (mild): Secondary | ICD-10-CM | POA: Diagnosis not present

## 2018-10-08 DIAGNOSIS — T84028D Dislocation of other internal joint prosthesis, subsequent encounter: Secondary | ICD-10-CM | POA: Diagnosis not present

## 2018-10-08 DIAGNOSIS — E785 Hyperlipidemia, unspecified: Secondary | ICD-10-CM | POA: Diagnosis not present

## 2018-10-08 DIAGNOSIS — E1140 Type 2 diabetes mellitus with diabetic neuropathy, unspecified: Secondary | ICD-10-CM | POA: Diagnosis not present

## 2018-10-08 DIAGNOSIS — I130 Hypertensive heart and chronic kidney disease with heart failure and stage 1 through stage 4 chronic kidney disease, or unspecified chronic kidney disease: Secondary | ICD-10-CM | POA: Diagnosis not present

## 2018-10-08 DIAGNOSIS — K219 Gastro-esophageal reflux disease without esophagitis: Secondary | ICD-10-CM | POA: Diagnosis not present

## 2018-10-08 DIAGNOSIS — T84012D Broken internal right knee prosthesis, subsequent encounter: Secondary | ICD-10-CM | POA: Diagnosis not present

## 2018-10-12 DIAGNOSIS — G4733 Obstructive sleep apnea (adult) (pediatric): Secondary | ICD-10-CM | POA: Diagnosis not present

## 2018-10-12 DIAGNOSIS — K219 Gastro-esophageal reflux disease without esophagitis: Secondary | ICD-10-CM | POA: Diagnosis not present

## 2018-10-12 DIAGNOSIS — E1122 Type 2 diabetes mellitus with diabetic chronic kidney disease: Secondary | ICD-10-CM | POA: Diagnosis not present

## 2018-10-12 DIAGNOSIS — I509 Heart failure, unspecified: Secondary | ICD-10-CM | POA: Diagnosis not present

## 2018-10-12 DIAGNOSIS — T84028D Dislocation of other internal joint prosthesis, subsequent encounter: Secondary | ICD-10-CM | POA: Diagnosis not present

## 2018-10-12 DIAGNOSIS — I130 Hypertensive heart and chronic kidney disease with heart failure and stage 1 through stage 4 chronic kidney disease, or unspecified chronic kidney disease: Secondary | ICD-10-CM | POA: Diagnosis not present

## 2018-10-12 DIAGNOSIS — E1140 Type 2 diabetes mellitus with diabetic neuropathy, unspecified: Secondary | ICD-10-CM | POA: Diagnosis not present

## 2018-10-12 DIAGNOSIS — Z87891 Personal history of nicotine dependence: Secondary | ICD-10-CM | POA: Diagnosis not present

## 2018-10-12 DIAGNOSIS — E785 Hyperlipidemia, unspecified: Secondary | ICD-10-CM | POA: Diagnosis not present

## 2018-10-12 DIAGNOSIS — N182 Chronic kidney disease, stage 2 (mild): Secondary | ICD-10-CM | POA: Diagnosis not present

## 2018-10-12 DIAGNOSIS — Z9181 History of falling: Secondary | ICD-10-CM | POA: Diagnosis not present

## 2018-10-12 DIAGNOSIS — T84012D Broken internal right knee prosthesis, subsequent encounter: Secondary | ICD-10-CM | POA: Diagnosis not present

## 2018-10-15 DIAGNOSIS — K219 Gastro-esophageal reflux disease without esophagitis: Secondary | ICD-10-CM | POA: Diagnosis not present

## 2018-10-15 DIAGNOSIS — G4733 Obstructive sleep apnea (adult) (pediatric): Secondary | ICD-10-CM | POA: Diagnosis not present

## 2018-10-15 DIAGNOSIS — E1140 Type 2 diabetes mellitus with diabetic neuropathy, unspecified: Secondary | ICD-10-CM | POA: Diagnosis not present

## 2018-10-15 DIAGNOSIS — Z9181 History of falling: Secondary | ICD-10-CM | POA: Diagnosis not present

## 2018-10-15 DIAGNOSIS — N182 Chronic kidney disease, stage 2 (mild): Secondary | ICD-10-CM | POA: Diagnosis not present

## 2018-10-15 DIAGNOSIS — E785 Hyperlipidemia, unspecified: Secondary | ICD-10-CM | POA: Diagnosis not present

## 2018-10-15 DIAGNOSIS — T84028D Dislocation of other internal joint prosthesis, subsequent encounter: Secondary | ICD-10-CM | POA: Diagnosis not present

## 2018-10-15 DIAGNOSIS — E1122 Type 2 diabetes mellitus with diabetic chronic kidney disease: Secondary | ICD-10-CM | POA: Diagnosis not present

## 2018-10-15 DIAGNOSIS — I509 Heart failure, unspecified: Secondary | ICD-10-CM | POA: Diagnosis not present

## 2018-10-15 DIAGNOSIS — Z87891 Personal history of nicotine dependence: Secondary | ICD-10-CM | POA: Diagnosis not present

## 2018-10-15 DIAGNOSIS — I130 Hypertensive heart and chronic kidney disease with heart failure and stage 1 through stage 4 chronic kidney disease, or unspecified chronic kidney disease: Secondary | ICD-10-CM | POA: Diagnosis not present

## 2018-10-15 DIAGNOSIS — T84012D Broken internal right knee prosthesis, subsequent encounter: Secondary | ICD-10-CM | POA: Diagnosis not present

## 2018-10-17 DIAGNOSIS — S83104A Unspecified dislocation of right knee, initial encounter: Secondary | ICD-10-CM | POA: Diagnosis not present

## 2018-10-17 DIAGNOSIS — M1710 Unilateral primary osteoarthritis, unspecified knee: Secondary | ICD-10-CM | POA: Diagnosis not present

## 2018-10-19 ENCOUNTER — Other Ambulatory Visit: Payer: Self-pay

## 2018-10-19 NOTE — Patient Outreach (Signed)
[facility]) Care Management  10/19/2018  [name] 08-13-1966 [id number]  Successful outreach to the patient who is able to confirm receipt of mailed resources. The patient reports she has chosen to sign another year lease at her current apartment due to concerns with finding suitable housing in such a short amount of time. The patient plans to complete housing applications for desired apartments to be placed on waiting lists. The patient further reports she is hopeful she will be accepted into the [employer] program.   The patient reports she does intend to access several food pantry's with the help of her aide to assist with transporting groceries. BSW to perform a case closure as no other social work needs have been identified during this call. The patient is agreeable with this plan and has this BSW's number if she should need assistance in the future.  Daneen Schick, BSW, CDP [facility] [phone]

## 2018-10-20 DIAGNOSIS — T84028D Dislocation of other internal joint prosthesis, subsequent encounter: Secondary | ICD-10-CM | POA: Diagnosis not present

## 2018-10-20 DIAGNOSIS — Z9181 History of falling: Secondary | ICD-10-CM | POA: Diagnosis not present

## 2018-10-20 DIAGNOSIS — E1140 Type 2 diabetes mellitus with diabetic neuropathy, unspecified: Secondary | ICD-10-CM | POA: Diagnosis not present

## 2018-10-20 DIAGNOSIS — T84012D Broken internal right knee prosthesis, subsequent encounter: Secondary | ICD-10-CM | POA: Diagnosis not present

## 2018-10-20 DIAGNOSIS — I509 Heart failure, unspecified: Secondary | ICD-10-CM | POA: Diagnosis not present

## 2018-10-20 DIAGNOSIS — N182 Chronic kidney disease, stage 2 (mild): Secondary | ICD-10-CM | POA: Diagnosis not present

## 2018-10-20 DIAGNOSIS — Z87891 Personal history of nicotine dependence: Secondary | ICD-10-CM | POA: Diagnosis not present

## 2018-10-20 DIAGNOSIS — E785 Hyperlipidemia, unspecified: Secondary | ICD-10-CM | POA: Diagnosis not present

## 2018-10-20 DIAGNOSIS — I130 Hypertensive heart and chronic kidney disease with heart failure and stage 1 through stage 4 chronic kidney disease, or unspecified chronic kidney disease: Secondary | ICD-10-CM | POA: Diagnosis not present

## 2018-10-20 DIAGNOSIS — E1122 Type 2 diabetes mellitus with diabetic chronic kidney disease: Secondary | ICD-10-CM | POA: Diagnosis not present

## 2018-10-20 DIAGNOSIS — K219 Gastro-esophageal reflux disease without esophagitis: Secondary | ICD-10-CM | POA: Diagnosis not present

## 2018-10-20 DIAGNOSIS — G4733 Obstructive sleep apnea (adult) (pediatric): Secondary | ICD-10-CM | POA: Diagnosis not present

## 2018-10-22 DIAGNOSIS — E1140 Type 2 diabetes mellitus with diabetic neuropathy, unspecified: Secondary | ICD-10-CM | POA: Diagnosis not present

## 2018-10-22 DIAGNOSIS — Z87891 Personal history of nicotine dependence: Secondary | ICD-10-CM | POA: Diagnosis not present

## 2018-10-22 DIAGNOSIS — E1122 Type 2 diabetes mellitus with diabetic chronic kidney disease: Secondary | ICD-10-CM | POA: Diagnosis not present

## 2018-10-22 DIAGNOSIS — E785 Hyperlipidemia, unspecified: Secondary | ICD-10-CM | POA: Diagnosis not present

## 2018-10-22 DIAGNOSIS — Z9181 History of falling: Secondary | ICD-10-CM | POA: Diagnosis not present

## 2018-10-22 DIAGNOSIS — T84012D Broken internal right knee prosthesis, subsequent encounter: Secondary | ICD-10-CM | POA: Diagnosis not present

## 2018-10-22 DIAGNOSIS — T84028D Dislocation of other internal joint prosthesis, subsequent encounter: Secondary | ICD-10-CM | POA: Diagnosis not present

## 2018-10-22 DIAGNOSIS — G4733 Obstructive sleep apnea (adult) (pediatric): Secondary | ICD-10-CM | POA: Diagnosis not present

## 2018-10-22 DIAGNOSIS — N182 Chronic kidney disease, stage 2 (mild): Secondary | ICD-10-CM | POA: Diagnosis not present

## 2018-10-22 DIAGNOSIS — I509 Heart failure, unspecified: Secondary | ICD-10-CM | POA: Diagnosis not present

## 2018-10-22 DIAGNOSIS — I130 Hypertensive heart and chronic kidney disease with heart failure and stage 1 through stage 4 chronic kidney disease, or unspecified chronic kidney disease: Secondary | ICD-10-CM | POA: Diagnosis not present

## 2018-10-22 DIAGNOSIS — K219 Gastro-esophageal reflux disease without esophagitis: Secondary | ICD-10-CM | POA: Diagnosis not present

## 2018-10-26 DIAGNOSIS — Z87891 Personal history of nicotine dependence: Secondary | ICD-10-CM | POA: Diagnosis not present

## 2018-10-26 DIAGNOSIS — I509 Heart failure, unspecified: Secondary | ICD-10-CM | POA: Diagnosis not present

## 2018-10-26 DIAGNOSIS — K219 Gastro-esophageal reflux disease without esophagitis: Secondary | ICD-10-CM | POA: Diagnosis not present

## 2018-10-26 DIAGNOSIS — E785 Hyperlipidemia, unspecified: Secondary | ICD-10-CM | POA: Diagnosis not present

## 2018-10-26 DIAGNOSIS — G4733 Obstructive sleep apnea (adult) (pediatric): Secondary | ICD-10-CM | POA: Diagnosis not present

## 2018-10-26 DIAGNOSIS — N182 Chronic kidney disease, stage 2 (mild): Secondary | ICD-10-CM | POA: Diagnosis not present

## 2018-10-26 DIAGNOSIS — T84012D Broken internal right knee prosthesis, subsequent encounter: Secondary | ICD-10-CM | POA: Diagnosis not present

## 2018-10-26 DIAGNOSIS — Z9181 History of falling: Secondary | ICD-10-CM | POA: Diagnosis not present

## 2018-10-26 DIAGNOSIS — E1140 Type 2 diabetes mellitus with diabetic neuropathy, unspecified: Secondary | ICD-10-CM | POA: Diagnosis not present

## 2018-10-26 DIAGNOSIS — I130 Hypertensive heart and chronic kidney disease with heart failure and stage 1 through stage 4 chronic kidney disease, or unspecified chronic kidney disease: Secondary | ICD-10-CM | POA: Diagnosis not present

## 2018-10-26 DIAGNOSIS — T84028D Dislocation of other internal joint prosthesis, subsequent encounter: Secondary | ICD-10-CM | POA: Diagnosis not present

## 2018-10-26 DIAGNOSIS — E1122 Type 2 diabetes mellitus with diabetic chronic kidney disease: Secondary | ICD-10-CM | POA: Diagnosis not present

## 2018-10-28 ENCOUNTER — Ambulatory Visit (INDEPENDENT_AMBULATORY_CARE_PROVIDER_SITE_OTHER): Payer: Medicare Other | Admitting: Physician Assistant

## 2018-10-29 DIAGNOSIS — E1122 Type 2 diabetes mellitus with diabetic chronic kidney disease: Secondary | ICD-10-CM | POA: Diagnosis not present

## 2018-10-29 DIAGNOSIS — E1140 Type 2 diabetes mellitus with diabetic neuropathy, unspecified: Secondary | ICD-10-CM | POA: Diagnosis not present

## 2018-10-29 DIAGNOSIS — T84028D Dislocation of other internal joint prosthesis, subsequent encounter: Secondary | ICD-10-CM | POA: Diagnosis not present

## 2018-10-29 DIAGNOSIS — N182 Chronic kidney disease, stage 2 (mild): Secondary | ICD-10-CM | POA: Diagnosis not present

## 2018-10-29 DIAGNOSIS — E785 Hyperlipidemia, unspecified: Secondary | ICD-10-CM | POA: Diagnosis not present

## 2018-10-29 DIAGNOSIS — K219 Gastro-esophageal reflux disease without esophagitis: Secondary | ICD-10-CM | POA: Diagnosis not present

## 2018-10-29 DIAGNOSIS — G4733 Obstructive sleep apnea (adult) (pediatric): Secondary | ICD-10-CM | POA: Diagnosis not present

## 2018-10-29 DIAGNOSIS — Z9181 History of falling: Secondary | ICD-10-CM | POA: Diagnosis not present

## 2018-10-29 DIAGNOSIS — T84012D Broken internal right knee prosthesis, subsequent encounter: Secondary | ICD-10-CM | POA: Diagnosis not present

## 2018-10-29 DIAGNOSIS — Z87891 Personal history of nicotine dependence: Secondary | ICD-10-CM | POA: Diagnosis not present

## 2018-10-29 DIAGNOSIS — I130 Hypertensive heart and chronic kidney disease with heart failure and stage 1 through stage 4 chronic kidney disease, or unspecified chronic kidney disease: Secondary | ICD-10-CM | POA: Diagnosis not present

## 2018-10-29 DIAGNOSIS — I509 Heart failure, unspecified: Secondary | ICD-10-CM | POA: Diagnosis not present

## 2018-11-02 DIAGNOSIS — T84028D Dislocation of other internal joint prosthesis, subsequent encounter: Secondary | ICD-10-CM | POA: Diagnosis not present

## 2018-11-02 DIAGNOSIS — T84012D Broken internal right knee prosthesis, subsequent encounter: Secondary | ICD-10-CM | POA: Diagnosis not present

## 2018-11-02 DIAGNOSIS — K219 Gastro-esophageal reflux disease without esophagitis: Secondary | ICD-10-CM | POA: Diagnosis not present

## 2018-11-02 DIAGNOSIS — Z87891 Personal history of nicotine dependence: Secondary | ICD-10-CM | POA: Diagnosis not present

## 2018-11-02 DIAGNOSIS — N182 Chronic kidney disease, stage 2 (mild): Secondary | ICD-10-CM | POA: Diagnosis not present

## 2018-11-02 DIAGNOSIS — E1122 Type 2 diabetes mellitus with diabetic chronic kidney disease: Secondary | ICD-10-CM | POA: Diagnosis not present

## 2018-11-02 DIAGNOSIS — E785 Hyperlipidemia, unspecified: Secondary | ICD-10-CM | POA: Diagnosis not present

## 2018-11-02 DIAGNOSIS — G4733 Obstructive sleep apnea (adult) (pediatric): Secondary | ICD-10-CM | POA: Diagnosis not present

## 2018-11-02 DIAGNOSIS — Z9181 History of falling: Secondary | ICD-10-CM | POA: Diagnosis not present

## 2018-11-02 DIAGNOSIS — I509 Heart failure, unspecified: Secondary | ICD-10-CM | POA: Diagnosis not present

## 2018-11-02 DIAGNOSIS — E1140 Type 2 diabetes mellitus with diabetic neuropathy, unspecified: Secondary | ICD-10-CM | POA: Diagnosis not present

## 2018-11-02 DIAGNOSIS — I130 Hypertensive heart and chronic kidney disease with heart failure and stage 1 through stage 4 chronic kidney disease, or unspecified chronic kidney disease: Secondary | ICD-10-CM | POA: Diagnosis not present

## 2018-11-04 DIAGNOSIS — N182 Chronic kidney disease, stage 2 (mild): Secondary | ICD-10-CM | POA: Diagnosis not present

## 2018-11-04 DIAGNOSIS — G4733 Obstructive sleep apnea (adult) (pediatric): Secondary | ICD-10-CM | POA: Diagnosis not present

## 2018-11-04 DIAGNOSIS — T84012D Broken internal right knee prosthesis, subsequent encounter: Secondary | ICD-10-CM | POA: Diagnosis not present

## 2018-11-04 DIAGNOSIS — E785 Hyperlipidemia, unspecified: Secondary | ICD-10-CM | POA: Diagnosis not present

## 2018-11-04 DIAGNOSIS — I130 Hypertensive heart and chronic kidney disease with heart failure and stage 1 through stage 4 chronic kidney disease, or unspecified chronic kidney disease: Secondary | ICD-10-CM | POA: Diagnosis not present

## 2018-11-04 DIAGNOSIS — T84028D Dislocation of other internal joint prosthesis, subsequent encounter: Secondary | ICD-10-CM | POA: Diagnosis not present

## 2018-11-04 DIAGNOSIS — Z9181 History of falling: Secondary | ICD-10-CM | POA: Diagnosis not present

## 2018-11-04 DIAGNOSIS — E1140 Type 2 diabetes mellitus with diabetic neuropathy, unspecified: Secondary | ICD-10-CM | POA: Diagnosis not present

## 2018-11-04 DIAGNOSIS — I509 Heart failure, unspecified: Secondary | ICD-10-CM | POA: Diagnosis not present

## 2018-11-04 DIAGNOSIS — K219 Gastro-esophageal reflux disease without esophagitis: Secondary | ICD-10-CM | POA: Diagnosis not present

## 2018-11-04 DIAGNOSIS — Z87891 Personal history of nicotine dependence: Secondary | ICD-10-CM | POA: Diagnosis not present

## 2018-11-04 DIAGNOSIS — E1122 Type 2 diabetes mellitus with diabetic chronic kidney disease: Secondary | ICD-10-CM | POA: Diagnosis not present

## 2018-11-08 ENCOUNTER — Ambulatory Visit (INDEPENDENT_AMBULATORY_CARE_PROVIDER_SITE_OTHER): Payer: Medicare Other | Admitting: Physician Assistant

## 2018-11-08 DIAGNOSIS — Z87891 Personal history of nicotine dependence: Secondary | ICD-10-CM | POA: Diagnosis not present

## 2018-11-08 DIAGNOSIS — Z79899 Other long term (current) drug therapy: Secondary | ICD-10-CM | POA: Diagnosis not present

## 2018-11-08 DIAGNOSIS — M47817 Spondylosis without myelopathy or radiculopathy, lumbosacral region: Secondary | ICD-10-CM | POA: Diagnosis not present

## 2018-11-09 ENCOUNTER — Ambulatory Visit (INDEPENDENT_AMBULATORY_CARE_PROVIDER_SITE_OTHER): Payer: Medicare Other | Admitting: Physician Assistant

## 2018-11-12 ENCOUNTER — Telehealth (INDEPENDENT_AMBULATORY_CARE_PROVIDER_SITE_OTHER): Payer: Self-pay | Admitting: Orthopedic Surgery

## 2018-11-12 DIAGNOSIS — Z87891 Personal history of nicotine dependence: Secondary | ICD-10-CM | POA: Diagnosis not present

## 2018-11-12 DIAGNOSIS — I130 Hypertensive heart and chronic kidney disease with heart failure and stage 1 through stage 4 chronic kidney disease, or unspecified chronic kidney disease: Secondary | ICD-10-CM | POA: Diagnosis not present

## 2018-11-12 DIAGNOSIS — E1140 Type 2 diabetes mellitus with diabetic neuropathy, unspecified: Secondary | ICD-10-CM | POA: Diagnosis not present

## 2018-11-12 DIAGNOSIS — E785 Hyperlipidemia, unspecified: Secondary | ICD-10-CM | POA: Diagnosis not present

## 2018-11-12 DIAGNOSIS — T84012D Broken internal right knee prosthesis, subsequent encounter: Secondary | ICD-10-CM | POA: Diagnosis not present

## 2018-11-12 DIAGNOSIS — I509 Heart failure, unspecified: Secondary | ICD-10-CM | POA: Diagnosis not present

## 2018-11-12 DIAGNOSIS — Z9181 History of falling: Secondary | ICD-10-CM | POA: Diagnosis not present

## 2018-11-12 DIAGNOSIS — N182 Chronic kidney disease, stage 2 (mild): Secondary | ICD-10-CM | POA: Diagnosis not present

## 2018-11-12 DIAGNOSIS — G4733 Obstructive sleep apnea (adult) (pediatric): Secondary | ICD-10-CM | POA: Diagnosis not present

## 2018-11-12 DIAGNOSIS — E1122 Type 2 diabetes mellitus with diabetic chronic kidney disease: Secondary | ICD-10-CM | POA: Diagnosis not present

## 2018-11-12 DIAGNOSIS — K219 Gastro-esophageal reflux disease without esophagitis: Secondary | ICD-10-CM | POA: Diagnosis not present

## 2018-11-12 DIAGNOSIS — T84028D Dislocation of other internal joint prosthesis, subsequent encounter: Secondary | ICD-10-CM | POA: Diagnosis not present

## 2018-11-12 NOTE — Telephone Encounter (Signed)
[facility]  [phone]    Verbal orders  2 wk 2    Cindee Salt would like to know can patient have an occupational therapist as well

## 2018-11-15 DIAGNOSIS — Q762 Congenital spondylolisthesis: Secondary | ICD-10-CM | POA: Diagnosis not present

## 2018-11-15 DIAGNOSIS — S83104A Unspecified dislocation of right knee, initial encounter: Secondary | ICD-10-CM | POA: Diagnosis not present

## 2018-11-15 DIAGNOSIS — Z96651 Presence of right artificial knee joint: Secondary | ICD-10-CM | POA: Diagnosis not present

## 2018-11-15 NOTE — Telephone Encounter (Signed)
Called and lm on vm to advise verbal ok for HHPT and may have OT consult. To call with questions.

## 2018-11-16 DIAGNOSIS — T84012D Broken internal right knee prosthesis, subsequent encounter: Secondary | ICD-10-CM | POA: Diagnosis not present

## 2018-11-16 DIAGNOSIS — N182 Chronic kidney disease, stage 2 (mild): Secondary | ICD-10-CM | POA: Diagnosis not present

## 2018-11-16 DIAGNOSIS — K219 Gastro-esophageal reflux disease without esophagitis: Secondary | ICD-10-CM | POA: Diagnosis not present

## 2018-11-16 DIAGNOSIS — T84028D Dislocation of other internal joint prosthesis, subsequent encounter: Secondary | ICD-10-CM | POA: Diagnosis not present

## 2018-11-16 DIAGNOSIS — E1140 Type 2 diabetes mellitus with diabetic neuropathy, unspecified: Secondary | ICD-10-CM | POA: Diagnosis not present

## 2018-11-16 DIAGNOSIS — E785 Hyperlipidemia, unspecified: Secondary | ICD-10-CM | POA: Diagnosis not present

## 2018-11-16 DIAGNOSIS — I130 Hypertensive heart and chronic kidney disease with heart failure and stage 1 through stage 4 chronic kidney disease, or unspecified chronic kidney disease: Secondary | ICD-10-CM | POA: Diagnosis not present

## 2018-11-16 DIAGNOSIS — E1122 Type 2 diabetes mellitus with diabetic chronic kidney disease: Secondary | ICD-10-CM | POA: Diagnosis not present

## 2018-11-16 DIAGNOSIS — I509 Heart failure, unspecified: Secondary | ICD-10-CM | POA: Diagnosis not present

## 2018-11-16 DIAGNOSIS — Z87891 Personal history of nicotine dependence: Secondary | ICD-10-CM | POA: Diagnosis not present

## 2018-11-16 DIAGNOSIS — G4733 Obstructive sleep apnea (adult) (pediatric): Secondary | ICD-10-CM | POA: Diagnosis not present

## 2018-11-16 DIAGNOSIS — Z9181 History of falling: Secondary | ICD-10-CM | POA: Diagnosis not present

## 2018-11-18 DIAGNOSIS — Z9181 History of falling: Secondary | ICD-10-CM | POA: Diagnosis not present

## 2018-11-18 DIAGNOSIS — T84028D Dislocation of other internal joint prosthesis, subsequent encounter: Secondary | ICD-10-CM | POA: Diagnosis not present

## 2018-11-18 DIAGNOSIS — K219 Gastro-esophageal reflux disease without esophagitis: Secondary | ICD-10-CM | POA: Diagnosis not present

## 2018-11-18 DIAGNOSIS — I130 Hypertensive heart and chronic kidney disease with heart failure and stage 1 through stage 4 chronic kidney disease, or unspecified chronic kidney disease: Secondary | ICD-10-CM | POA: Diagnosis not present

## 2018-11-18 DIAGNOSIS — G4733 Obstructive sleep apnea (adult) (pediatric): Secondary | ICD-10-CM | POA: Diagnosis not present

## 2018-11-18 DIAGNOSIS — I509 Heart failure, unspecified: Secondary | ICD-10-CM | POA: Diagnosis not present

## 2018-11-18 DIAGNOSIS — E1122 Type 2 diabetes mellitus with diabetic chronic kidney disease: Secondary | ICD-10-CM | POA: Diagnosis not present

## 2018-11-18 DIAGNOSIS — E1140 Type 2 diabetes mellitus with diabetic neuropathy, unspecified: Secondary | ICD-10-CM | POA: Diagnosis not present

## 2018-11-18 DIAGNOSIS — Z87891 Personal history of nicotine dependence: Secondary | ICD-10-CM | POA: Diagnosis not present

## 2018-11-18 DIAGNOSIS — E785 Hyperlipidemia, unspecified: Secondary | ICD-10-CM | POA: Diagnosis not present

## 2018-11-18 DIAGNOSIS — T84012D Broken internal right knee prosthesis, subsequent encounter: Secondary | ICD-10-CM | POA: Diagnosis not present

## 2018-11-18 DIAGNOSIS — N182 Chronic kidney disease, stage 2 (mild): Secondary | ICD-10-CM | POA: Diagnosis not present

## 2018-11-19 ENCOUNTER — Encounter (INDEPENDENT_AMBULATORY_CARE_PROVIDER_SITE_OTHER): Payer: Self-pay | Admitting: Physician Assistant

## 2018-11-19 ENCOUNTER — Telehealth (INDEPENDENT_AMBULATORY_CARE_PROVIDER_SITE_OTHER): Payer: Self-pay

## 2018-11-19 ENCOUNTER — Telehealth (INDEPENDENT_AMBULATORY_CARE_PROVIDER_SITE_OTHER): Payer: Self-pay | Admitting: Orthopedic Surgery

## 2018-11-19 ENCOUNTER — Ambulatory Visit (INDEPENDENT_AMBULATORY_CARE_PROVIDER_SITE_OTHER): Payer: Medicare Other | Admitting: Physician Assistant

## 2018-11-19 ENCOUNTER — Telehealth (INDEPENDENT_AMBULATORY_CARE_PROVIDER_SITE_OTHER): Payer: Self-pay | Admitting: Physician Assistant

## 2018-11-19 VITALS — Ht 64.0 in | Wt 336.0 lb

## 2018-11-19 DIAGNOSIS — Z96651 Presence of right artificial knee joint: Secondary | ICD-10-CM

## 2018-11-19 DIAGNOSIS — Z6841 Body Mass Index (BMI) 40.0 and over, adult: Secondary | ICD-10-CM | POA: Diagnosis not present

## 2018-11-19 NOTE — Telephone Encounter (Signed)
Tried to call but voicemail box was full to give verbal okay for orders.

## 2018-11-19 NOTE — Telephone Encounter (Signed)
I tried to contact patient's OT and could not lvm due to being full to give verbal okay for orders.

## 2018-11-19 NOTE — Progress Notes (Signed)
Office Visit Note   Patient: Carrie HEMBERGER           Date of Birth: 17-Mar-1966           MRN: [id number] Visit Date: 11/19/2018              Requested by: Care, [facility] [address] [place], [place] [address] PCP: [facility], [facility]  Chief Complaint  Patient presents with  . Right Knee - Follow-up      HPI: The patient is a 53 year old woman who is seen for postoperative follow-up following right total knee revision surgery and August 2019.  She underwent placement of a hinged knee.  She has continued to work with physical therapy and is very pleased with her progress.  She is now walking with a walker.  She continues to have a little bit of difficulty with sit to stand but is been very encouraged by her progress.  She is no longer using the power wheelchair.  She has been going to pain management and is doing very well with this as well and is pleased with her progress.  Assessment & Plan: Visit Diagnoses:  1. Status post revision of total replacement of right knee   2. Body mass index 50.0-59.9, adult ([place])     Plan: Continue to work with physical therapy for progression with gait.  She is allowed to do water aerobics should she desire.  She is still wearing the knee immobilizer but her strength is markedly improved and she may wean off to this when she feels confident.  She will follow-up here in a couple of months or sooner should she have difficulties in the interim.  Follow-Up Instructions: Return in about 2 months (around 01/19/2019).   Ortho Exam  Patient is alert, oriented, no adenopathy, well-dressed, normal affect, normal respiratory effort. The right knee incision is well-healed.  She does have some firm and slightly hypertrophic scar and she was instructed to continue to massage over this area.  She has full knee extension and good flexion and good quadriceps strength.  The quadriceps tendon is intact.  She has  some difficulty with sit to stand but then when she is up walking ambulates with a minimally antalgic gait.  She is neurovascularly intact distally.  Imaging: No results found.      Labs: Lab Results  Component Value Date   HGBA1C 7.1 (H) 05/14/2018   HGBA1C 11.3 (H) 01/03/2013   HGBA1C 8.6 (H) 03/05/2011   REPTSTATUS 04/16/2011 FINAL 04/10/2011   CULT NO GROWTH 5 DAYS 04/10/2011     Lab Results  Component Value Date   ALBUMIN 2.9 (L) 02/10/2018   ALBUMIN 2.5 (L) 11/10/2017   ALBUMIN 3.0 (L) 05/14/2014    Body mass index is 57.67 kg/m.  Orders:  No orders of the defined types were placed in this encounter.  No orders of the defined types were placed in this encounter.    Procedures: No procedures performed  Clinical Data: No additional findings.  ROS:  All other systems negative, except as noted in the HPI. Review of Systems  Objective: Vital Signs: Ht 5\' 4"  (1.626 m)   Wt (!) 336 lb (152.4 kg)   LMP 11/09/2012   BMI 57.67 kg/m   Specialty Comments:  No specialty comments available.  PMFS History: Patient Active Problem List   Diagnosis Date Noted  . S/P revision of total knee, right 05/14/2018  . Dislocated knee, right,  sequela   . S/P revision of total knee 02/17/2018  . Failed total right knee replacement ([place])   . Chronic renal insufficiency, stage 2 (mild) 11/10/2017  . Acute CHF (congestive heart failure) ([place]) 11/10/2017  . Failed total knee arthroplasty, sequela 08/31/2017  . Lower extremity cellulitis 01/03/2013  . Diabetes mellitus ([place]) 01/03/2013  . Morbid (severe) obesity due to excess calories ([place]) 11/12/2006  . TOBACCO DEPENDENCE 11/12/2006  . DEPRESSIVE DISORDER, NOS 11/12/2006  . HYPERTENSION, BENIGN SYSTEMIC 11/12/2006  . GASTROESOPHAGEAL REFLUX, NO ESOPHAGITIS 11/12/2006  . ACNE 11/12/2006  . OSTEOARTHRITIS, LOWER LEG 11/12/2006  . APNEA, SLEEP 11/12/2006   Past Medical History:  Diagnosis Date  . Arthritis     "hands, arms, feet, back, knees" (02/17/2018)  . Cardiomyopathy [facility])    From Dr. Bonney Roussel office notes from 01/13/18  . CHF (congestive heart failure) ([place])   . Chronic lower back pain   . Diabetic neuropathy ([place])   . Family history of adverse reaction to anesthesia    Mother is hard to arouse and blood pressure drops  . GERD (gastroesophageal reflux disease)   . High cholesterol   . Hypertension   . Sleep apnea    "need new machine"  - does not use a cpap (02/17/2018)  . Type II diabetes mellitus ([place])    no longer on medications (02/17/2018)    Family History  Problem Relation Age of Onset  . Kidney disease Mother   . Congestive Heart Failure Mother   . Hypertension Father     Past Surgical History:  Procedure Laterality Date  . ANTERIOR CERVICAL DECOMP/DISCECTOMY FUSION  03/06/2010   C4-7 w/corpectomy/notes 03/17/2010  . BACK SURGERY    . CARPAL TUNNEL RELEASE Right   . HARDWARE REVISION  04/10/2010   cervical hardware revision screw replacement C4/notes 04/12/2010  . JOINT REPLACEMENT    . LAPAROSCOPIC CHOLECYSTECTOMY  1999  . LUMBAR LAMINECTOMY/DECOMPRESSION MICRODISCECTOMY  09/2010   Archie Endo 10/05/2010  . MULTIPLE EXTRACTIONS WITH ALVEOLOPLASTY [name] 01/19/2015   Procedure: MULTIPLE EXTRACTIONS Three, Four, Five, Seven, eight, nine, ten, eleven, twelve, fourteen, eighteen, twenty-one, twenty-eight, twenty-nine, thirty-one with Alveoloplasty.  ;  Surgeon: Diona Browner, DDS;  Location: [facility];  Service: Oral Surgery;  Laterality: [name];  . REPLACEMENT TOTAL KNEE BILATERAL Bilateral 10/2007-03/2008   "left-right"  . TOENAIL EXCISION Bilateral    great toes  . TONSILLECTOMY    . TOTAL KNEE REVISION Right 02/17/2018  . TOTAL KNEE REVISION Right 02/17/2018   Procedure: RIGHT TOTAL KNEE REVISION;  Surgeon: Newt Minion, MD;  Location: [facility];  Service: Orthopedics;  Laterality: Right;  . TOTAL KNEE REVISION Right 05/14/2018   Procedure: RIGHT TOTAL KNEE REVISION TO HINGED KNEE;  Surgeon:  Newt Minion, MD;  Location: [facility];  Service: Orthopedics;  Laterality: Right;   Social History   Occupational History  . Not on file  Tobacco Use  . Smoking status: Current Every Day Smoker    Packs/day: 0.50    Years: 32.00    Pack years: 16.00    Types: Cigarettes  . Smokeless tobacco: Never Used  Substance and Sexual Activity  . Alcohol use: Not Currently  . Drug use: Not Currently    Types: Marijuana  . Sexual activity: Not Currently

## 2018-11-19 NOTE — Telephone Encounter (Signed)
[place]-(OT) called needing verbal orders for HHOT 1 Wk 1 and twice a wk for 1 wk. The number to contact [facility] is [phone]

## 2018-11-19 NOTE — Telephone Encounter (Signed)
Spoke with [facility] read note from star to her stating okay for verbal orders. [phone]

## 2018-11-23 ENCOUNTER — Telehealth (INDEPENDENT_AMBULATORY_CARE_PROVIDER_SITE_OTHER): Payer: Self-pay

## 2018-11-23 NOTE — Telephone Encounter (Signed)
Patient was called to come in and pickup her parking placard, patient understood and a copy was made.

## 2018-11-24 ENCOUNTER — Telehealth (INDEPENDENT_AMBULATORY_CARE_PROVIDER_SITE_OTHER): Payer: Self-pay | Admitting: Orthopedic Surgery

## 2018-11-24 DIAGNOSIS — T84012D Broken internal right knee prosthesis, subsequent encounter: Secondary | ICD-10-CM | POA: Diagnosis not present

## 2018-11-24 DIAGNOSIS — E1122 Type 2 diabetes mellitus with diabetic chronic kidney disease: Secondary | ICD-10-CM | POA: Diagnosis not present

## 2018-11-24 DIAGNOSIS — I130 Hypertensive heart and chronic kidney disease with heart failure and stage 1 through stage 4 chronic kidney disease, or unspecified chronic kidney disease: Secondary | ICD-10-CM | POA: Diagnosis not present

## 2018-11-24 DIAGNOSIS — Z87891 Personal history of nicotine dependence: Secondary | ICD-10-CM | POA: Diagnosis not present

## 2018-11-24 DIAGNOSIS — T84028D Dislocation of other internal joint prosthesis, subsequent encounter: Secondary | ICD-10-CM | POA: Diagnosis not present

## 2018-11-24 DIAGNOSIS — K219 Gastro-esophageal reflux disease without esophagitis: Secondary | ICD-10-CM | POA: Diagnosis not present

## 2018-11-24 DIAGNOSIS — Z9181 History of falling: Secondary | ICD-10-CM | POA: Diagnosis not present

## 2018-11-24 DIAGNOSIS — N182 Chronic kidney disease, stage 2 (mild): Secondary | ICD-10-CM | POA: Diagnosis not present

## 2018-11-24 DIAGNOSIS — E1140 Type 2 diabetes mellitus with diabetic neuropathy, unspecified: Secondary | ICD-10-CM | POA: Diagnosis not present

## 2018-11-24 DIAGNOSIS — E785 Hyperlipidemia, unspecified: Secondary | ICD-10-CM | POA: Diagnosis not present

## 2018-11-24 DIAGNOSIS — I509 Heart failure, unspecified: Secondary | ICD-10-CM | POA: Diagnosis not present

## 2018-11-24 DIAGNOSIS — G4733 Obstructive sleep apnea (adult) (pediatric): Secondary | ICD-10-CM | POA: Diagnosis not present

## 2018-11-24 NOTE — Telephone Encounter (Signed)
New Message  [name] with [facility] verbalized to extend home health occupational therapy twice a week for eight weeks.

## 2018-11-25 DIAGNOSIS — T84012D Broken internal right knee prosthesis, subsequent encounter: Secondary | ICD-10-CM | POA: Diagnosis not present

## 2018-11-25 DIAGNOSIS — I509 Heart failure, unspecified: Secondary | ICD-10-CM | POA: Diagnosis not present

## 2018-11-25 DIAGNOSIS — E1122 Type 2 diabetes mellitus with diabetic chronic kidney disease: Secondary | ICD-10-CM | POA: Diagnosis not present

## 2018-11-25 DIAGNOSIS — E785 Hyperlipidemia, unspecified: Secondary | ICD-10-CM | POA: Diagnosis not present

## 2018-11-25 DIAGNOSIS — Z9181 History of falling: Secondary | ICD-10-CM | POA: Diagnosis not present

## 2018-11-25 DIAGNOSIS — I130 Hypertensive heart and chronic kidney disease with heart failure and stage 1 through stage 4 chronic kidney disease, or unspecified chronic kidney disease: Secondary | ICD-10-CM | POA: Diagnosis not present

## 2018-11-25 DIAGNOSIS — T84028D Dislocation of other internal joint prosthesis, subsequent encounter: Secondary | ICD-10-CM | POA: Diagnosis not present

## 2018-11-25 DIAGNOSIS — E1140 Type 2 diabetes mellitus with diabetic neuropathy, unspecified: Secondary | ICD-10-CM | POA: Diagnosis not present

## 2018-11-25 DIAGNOSIS — K219 Gastro-esophageal reflux disease without esophagitis: Secondary | ICD-10-CM | POA: Diagnosis not present

## 2018-11-25 DIAGNOSIS — G4733 Obstructive sleep apnea (adult) (pediatric): Secondary | ICD-10-CM | POA: Diagnosis not present

## 2018-11-25 DIAGNOSIS — Z87891 Personal history of nicotine dependence: Secondary | ICD-10-CM | POA: Diagnosis not present

## 2018-11-25 DIAGNOSIS — N182 Chronic kidney disease, stage 2 (mild): Secondary | ICD-10-CM | POA: Diagnosis not present

## 2018-11-25 NOTE — Telephone Encounter (Signed)
Called and gave [facility]  okay for orders per Dr Sharol Given.

## 2018-11-26 ENCOUNTER — Telehealth (INDEPENDENT_AMBULATORY_CARE_PROVIDER_SITE_OTHER): Payer: Self-pay | Admitting: Orthopedic Surgery

## 2018-11-26 NOTE — Telephone Encounter (Signed)
[name] was called and lvm for okay to go ahead with PT.

## 2018-11-26 NOTE — Telephone Encounter (Signed)
New Message  [name] verbalized needing order for Physical Therapy. Twice a week for four weeks.

## 2018-11-30 DIAGNOSIS — Z9181 History of falling: Secondary | ICD-10-CM | POA: Diagnosis not present

## 2018-11-30 DIAGNOSIS — I130 Hypertensive heart and chronic kidney disease with heart failure and stage 1 through stage 4 chronic kidney disease, or unspecified chronic kidney disease: Secondary | ICD-10-CM | POA: Diagnosis not present

## 2018-11-30 DIAGNOSIS — E1140 Type 2 diabetes mellitus with diabetic neuropathy, unspecified: Secondary | ICD-10-CM | POA: Diagnosis not present

## 2018-11-30 DIAGNOSIS — E785 Hyperlipidemia, unspecified: Secondary | ICD-10-CM | POA: Diagnosis not present

## 2018-11-30 DIAGNOSIS — G4733 Obstructive sleep apnea (adult) (pediatric): Secondary | ICD-10-CM | POA: Diagnosis not present

## 2018-11-30 DIAGNOSIS — E1122 Type 2 diabetes mellitus with diabetic chronic kidney disease: Secondary | ICD-10-CM | POA: Diagnosis not present

## 2018-11-30 DIAGNOSIS — T84012D Broken internal right knee prosthesis, subsequent encounter: Secondary | ICD-10-CM | POA: Diagnosis not present

## 2018-11-30 DIAGNOSIS — K219 Gastro-esophageal reflux disease without esophagitis: Secondary | ICD-10-CM | POA: Diagnosis not present

## 2018-11-30 DIAGNOSIS — Z87891 Personal history of nicotine dependence: Secondary | ICD-10-CM | POA: Diagnosis not present

## 2018-11-30 DIAGNOSIS — T84028D Dislocation of other internal joint prosthesis, subsequent encounter: Secondary | ICD-10-CM | POA: Diagnosis not present

## 2018-11-30 DIAGNOSIS — I509 Heart failure, unspecified: Secondary | ICD-10-CM | POA: Diagnosis not present

## 2018-11-30 DIAGNOSIS — N182 Chronic kidney disease, stage 2 (mild): Secondary | ICD-10-CM | POA: Diagnosis not present

## 2018-12-01 DIAGNOSIS — E1122 Type 2 diabetes mellitus with diabetic chronic kidney disease: Secondary | ICD-10-CM | POA: Diagnosis not present

## 2018-12-01 DIAGNOSIS — N182 Chronic kidney disease, stage 2 (mild): Secondary | ICD-10-CM | POA: Diagnosis not present

## 2018-12-01 DIAGNOSIS — G4733 Obstructive sleep apnea (adult) (pediatric): Secondary | ICD-10-CM | POA: Diagnosis not present

## 2018-12-01 DIAGNOSIS — E785 Hyperlipidemia, unspecified: Secondary | ICD-10-CM | POA: Diagnosis not present

## 2018-12-01 DIAGNOSIS — T84012D Broken internal right knee prosthesis, subsequent encounter: Secondary | ICD-10-CM | POA: Diagnosis not present

## 2018-12-01 DIAGNOSIS — T84028D Dislocation of other internal joint prosthesis, subsequent encounter: Secondary | ICD-10-CM | POA: Diagnosis not present

## 2018-12-01 DIAGNOSIS — Z9181 History of falling: Secondary | ICD-10-CM | POA: Diagnosis not present

## 2018-12-01 DIAGNOSIS — I130 Hypertensive heart and chronic kidney disease with heart failure and stage 1 through stage 4 chronic kidney disease, or unspecified chronic kidney disease: Secondary | ICD-10-CM | POA: Diagnosis not present

## 2018-12-01 DIAGNOSIS — E1140 Type 2 diabetes mellitus with diabetic neuropathy, unspecified: Secondary | ICD-10-CM | POA: Diagnosis not present

## 2018-12-01 DIAGNOSIS — I509 Heart failure, unspecified: Secondary | ICD-10-CM | POA: Diagnosis not present

## 2018-12-01 DIAGNOSIS — K219 Gastro-esophageal reflux disease without esophagitis: Secondary | ICD-10-CM | POA: Diagnosis not present

## 2018-12-01 DIAGNOSIS — Z87891 Personal history of nicotine dependence: Secondary | ICD-10-CM | POA: Diagnosis not present

## 2018-12-02 DIAGNOSIS — I130 Hypertensive heart and chronic kidney disease with heart failure and stage 1 through stage 4 chronic kidney disease, or unspecified chronic kidney disease: Secondary | ICD-10-CM | POA: Diagnosis not present

## 2018-12-02 DIAGNOSIS — T84012D Broken internal right knee prosthesis, subsequent encounter: Secondary | ICD-10-CM | POA: Diagnosis not present

## 2018-12-02 DIAGNOSIS — T84028D Dislocation of other internal joint prosthesis, subsequent encounter: Secondary | ICD-10-CM | POA: Diagnosis not present

## 2018-12-02 DIAGNOSIS — E1122 Type 2 diabetes mellitus with diabetic chronic kidney disease: Secondary | ICD-10-CM | POA: Diagnosis not present

## 2018-12-02 DIAGNOSIS — Z9181 History of falling: Secondary | ICD-10-CM | POA: Diagnosis not present

## 2018-12-02 DIAGNOSIS — N182 Chronic kidney disease, stage 2 (mild): Secondary | ICD-10-CM | POA: Diagnosis not present

## 2018-12-02 DIAGNOSIS — I509 Heart failure, unspecified: Secondary | ICD-10-CM | POA: Diagnosis not present

## 2018-12-02 DIAGNOSIS — G4733 Obstructive sleep apnea (adult) (pediatric): Secondary | ICD-10-CM | POA: Diagnosis not present

## 2018-12-02 DIAGNOSIS — K219 Gastro-esophageal reflux disease without esophagitis: Secondary | ICD-10-CM | POA: Diagnosis not present

## 2018-12-02 DIAGNOSIS — Z87891 Personal history of nicotine dependence: Secondary | ICD-10-CM | POA: Diagnosis not present

## 2018-12-02 DIAGNOSIS — E785 Hyperlipidemia, unspecified: Secondary | ICD-10-CM | POA: Diagnosis not present

## 2018-12-02 DIAGNOSIS — E1140 Type 2 diabetes mellitus with diabetic neuropathy, unspecified: Secondary | ICD-10-CM | POA: Diagnosis not present

## 2018-12-03 DIAGNOSIS — N182 Chronic kidney disease, stage 2 (mild): Secondary | ICD-10-CM | POA: Diagnosis not present

## 2018-12-03 DIAGNOSIS — G4733 Obstructive sleep apnea (adult) (pediatric): Secondary | ICD-10-CM | POA: Diagnosis not present

## 2018-12-03 DIAGNOSIS — E785 Hyperlipidemia, unspecified: Secondary | ICD-10-CM | POA: Diagnosis not present

## 2018-12-03 DIAGNOSIS — Z9181 History of falling: Secondary | ICD-10-CM | POA: Diagnosis not present

## 2018-12-03 DIAGNOSIS — K219 Gastro-esophageal reflux disease without esophagitis: Secondary | ICD-10-CM | POA: Diagnosis not present

## 2018-12-03 DIAGNOSIS — Z87891 Personal history of nicotine dependence: Secondary | ICD-10-CM | POA: Diagnosis not present

## 2018-12-03 DIAGNOSIS — I130 Hypertensive heart and chronic kidney disease with heart failure and stage 1 through stage 4 chronic kidney disease, or unspecified chronic kidney disease: Secondary | ICD-10-CM | POA: Diagnosis not present

## 2018-12-03 DIAGNOSIS — E1140 Type 2 diabetes mellitus with diabetic neuropathy, unspecified: Secondary | ICD-10-CM | POA: Diagnosis not present

## 2018-12-03 DIAGNOSIS — E1122 Type 2 diabetes mellitus with diabetic chronic kidney disease: Secondary | ICD-10-CM | POA: Diagnosis not present

## 2018-12-03 DIAGNOSIS — T84012D Broken internal right knee prosthesis, subsequent encounter: Secondary | ICD-10-CM | POA: Diagnosis not present

## 2018-12-03 DIAGNOSIS — I509 Heart failure, unspecified: Secondary | ICD-10-CM | POA: Diagnosis not present

## 2018-12-03 DIAGNOSIS — T84028D Dislocation of other internal joint prosthesis, subsequent encounter: Secondary | ICD-10-CM | POA: Diagnosis not present

## 2018-12-06 ENCOUNTER — Ambulatory Visit: Payer: Medicare Other | Admitting: Obstetrics

## 2018-12-06 DIAGNOSIS — Z780 Asymptomatic menopausal state: Secondary | ICD-10-CM | POA: Diagnosis not present

## 2018-12-06 DIAGNOSIS — Z79899 Other long term (current) drug therapy: Secondary | ICD-10-CM | POA: Diagnosis not present

## 2018-12-06 DIAGNOSIS — M47817 Spondylosis without myelopathy or radiculopathy, lumbosacral region: Secondary | ICD-10-CM | POA: Diagnosis not present

## 2018-12-06 DIAGNOSIS — Z87891 Personal history of nicotine dependence: Secondary | ICD-10-CM | POA: Diagnosis not present

## 2018-12-06 DIAGNOSIS — Z1389 Encounter for screening for other disorder: Secondary | ICD-10-CM | POA: Diagnosis not present

## 2018-12-07 DIAGNOSIS — E1140 Type 2 diabetes mellitus with diabetic neuropathy, unspecified: Secondary | ICD-10-CM | POA: Diagnosis not present

## 2018-12-07 DIAGNOSIS — Z87891 Personal history of nicotine dependence: Secondary | ICD-10-CM | POA: Diagnosis not present

## 2018-12-07 DIAGNOSIS — E785 Hyperlipidemia, unspecified: Secondary | ICD-10-CM | POA: Diagnosis not present

## 2018-12-07 DIAGNOSIS — K219 Gastro-esophageal reflux disease without esophagitis: Secondary | ICD-10-CM | POA: Diagnosis not present

## 2018-12-07 DIAGNOSIS — I509 Heart failure, unspecified: Secondary | ICD-10-CM | POA: Diagnosis not present

## 2018-12-07 DIAGNOSIS — E1122 Type 2 diabetes mellitus with diabetic chronic kidney disease: Secondary | ICD-10-CM | POA: Diagnosis not present

## 2018-12-07 DIAGNOSIS — I130 Hypertensive heart and chronic kidney disease with heart failure and stage 1 through stage 4 chronic kidney disease, or unspecified chronic kidney disease: Secondary | ICD-10-CM | POA: Diagnosis not present

## 2018-12-07 DIAGNOSIS — N182 Chronic kidney disease, stage 2 (mild): Secondary | ICD-10-CM | POA: Diagnosis not present

## 2018-12-07 DIAGNOSIS — T84028D Dislocation of other internal joint prosthesis, subsequent encounter: Secondary | ICD-10-CM | POA: Diagnosis not present

## 2018-12-07 DIAGNOSIS — T84012D Broken internal right knee prosthesis, subsequent encounter: Secondary | ICD-10-CM | POA: Diagnosis not present

## 2018-12-07 DIAGNOSIS — Z9181 History of falling: Secondary | ICD-10-CM | POA: Diagnosis not present

## 2018-12-07 DIAGNOSIS — G4733 Obstructive sleep apnea (adult) (pediatric): Secondary | ICD-10-CM | POA: Diagnosis not present

## 2018-12-08 DIAGNOSIS — Q762 Congenital spondylolisthesis: Secondary | ICD-10-CM | POA: Diagnosis not present

## 2018-12-08 DIAGNOSIS — Z96651 Presence of right artificial knee joint: Secondary | ICD-10-CM | POA: Diagnosis not present

## 2018-12-08 DIAGNOSIS — S83104A Unspecified dislocation of right knee, initial encounter: Secondary | ICD-10-CM | POA: Diagnosis not present

## 2018-12-09 DIAGNOSIS — I130 Hypertensive heart and chronic kidney disease with heart failure and stage 1 through stage 4 chronic kidney disease, or unspecified chronic kidney disease: Secondary | ICD-10-CM | POA: Diagnosis not present

## 2018-12-09 DIAGNOSIS — E1140 Type 2 diabetes mellitus with diabetic neuropathy, unspecified: Secondary | ICD-10-CM | POA: Diagnosis not present

## 2018-12-09 DIAGNOSIS — T84012D Broken internal right knee prosthesis, subsequent encounter: Secondary | ICD-10-CM | POA: Diagnosis not present

## 2018-12-09 DIAGNOSIS — N182 Chronic kidney disease, stage 2 (mild): Secondary | ICD-10-CM | POA: Diagnosis not present

## 2018-12-09 DIAGNOSIS — I509 Heart failure, unspecified: Secondary | ICD-10-CM | POA: Diagnosis not present

## 2018-12-09 DIAGNOSIS — E1122 Type 2 diabetes mellitus with diabetic chronic kidney disease: Secondary | ICD-10-CM | POA: Diagnosis not present

## 2018-12-09 DIAGNOSIS — K219 Gastro-esophageal reflux disease without esophagitis: Secondary | ICD-10-CM | POA: Diagnosis not present

## 2018-12-09 DIAGNOSIS — E785 Hyperlipidemia, unspecified: Secondary | ICD-10-CM | POA: Diagnosis not present

## 2018-12-09 DIAGNOSIS — Z9181 History of falling: Secondary | ICD-10-CM | POA: Diagnosis not present

## 2018-12-09 DIAGNOSIS — G4733 Obstructive sleep apnea (adult) (pediatric): Secondary | ICD-10-CM | POA: Diagnosis not present

## 2018-12-09 DIAGNOSIS — T84028D Dislocation of other internal joint prosthesis, subsequent encounter: Secondary | ICD-10-CM | POA: Diagnosis not present

## 2018-12-09 DIAGNOSIS — Z87891 Personal history of nicotine dependence: Secondary | ICD-10-CM | POA: Diagnosis not present

## 2018-12-16 ENCOUNTER — Telehealth (INDEPENDENT_AMBULATORY_CARE_PROVIDER_SITE_OTHER): Payer: Self-pay | Admitting: Orthopedic Surgery

## 2018-12-16 DIAGNOSIS — Q762 Congenital spondylolisthesis: Secondary | ICD-10-CM | POA: Diagnosis not present

## 2018-12-16 DIAGNOSIS — Z96651 Presence of right artificial knee joint: Secondary | ICD-10-CM | POA: Diagnosis not present

## 2018-12-16 DIAGNOSIS — S83104A Unspecified dislocation of right knee, initial encounter: Secondary | ICD-10-CM | POA: Diagnosis not present

## 2018-12-16 NOTE — Telephone Encounter (Signed)
Cindee Salt with [facility] wanted Dr Sharol Given to know patient missed her PT appt today, he states patient is sick. Erik's callback # [phone]

## 2018-12-16 NOTE — Telephone Encounter (Signed)
Rick with [facility] wanted to left Dr Sharol Given know patient missed her home visit for today. Rick's callback # [phone]

## 2018-12-17 ENCOUNTER — Telehealth (INDEPENDENT_AMBULATORY_CARE_PROVIDER_SITE_OTHER): Payer: Self-pay

## 2018-12-17 NOTE — Telephone Encounter (Signed)
Per call from [facility], patient has missed therapy the past two days.  She has not been feeling well.

## 2018-12-17 NOTE — Telephone Encounter (Signed)
Pt was called and checked on and she stated that the pollen was causing her reactions and she was not feeling well. She informed me that her nurse will be in to pickup her placard and letter for housing either today or Monday.

## 2018-12-17 NOTE — Telephone Encounter (Signed)
Pt was called.

## 2018-12-20 NOTE — Telephone Encounter (Signed)
Thank you. Had called pt previously and advised me that she was sick.

## 2018-12-21 ENCOUNTER — Telehealth (INDEPENDENT_AMBULATORY_CARE_PROVIDER_SITE_OTHER): Payer: Self-pay

## 2018-12-21 DIAGNOSIS — S83104A Unspecified dislocation of right knee, initial encounter: Secondary | ICD-10-CM | POA: Diagnosis not present

## 2018-12-21 DIAGNOSIS — Z96651 Presence of right artificial knee joint: Secondary | ICD-10-CM | POA: Diagnosis not present

## 2018-12-21 DIAGNOSIS — Q762 Congenital spondylolisthesis: Secondary | ICD-10-CM | POA: Diagnosis not present

## 2018-12-21 NOTE — Telephone Encounter (Signed)
Calling to advise that pt requested their services to be put on hold right now stating she is really not feeling well.

## 2018-12-21 NOTE — Telephone Encounter (Signed)
I called and advised PT that pt services can be held at this time due to pt being sick.

## 2018-12-28 DIAGNOSIS — I10 Essential (primary) hypertension: Secondary | ICD-10-CM | POA: Diagnosis not present

## 2018-12-31 ENCOUNTER — Emergency Department (HOSPITAL_COMMUNITY): Payer: Medicare Other

## 2018-12-31 ENCOUNTER — Other Ambulatory Visit: Payer: Self-pay

## 2018-12-31 ENCOUNTER — Emergency Department (HOSPITAL_COMMUNITY)
Admission: EM | Admit: 2018-12-31 | Discharge: 2018-12-31 | Disposition: A | Discharge status: Home / Self Care | Payer: Medicare Other | Attending: Emergency Medicine | Admitting: Emergency Medicine

## 2018-12-31 ENCOUNTER — Encounter (HOSPITAL_COMMUNITY): Payer: Self-pay | Admitting: *Deleted

## 2018-12-31 DIAGNOSIS — T40601A Poisoning by unspecified narcotics, accidental (unintentional), initial encounter: Secondary | ICD-10-CM | POA: Insufficient documentation

## 2018-12-31 DIAGNOSIS — I509 Heart failure, unspecified: Secondary | ICD-10-CM | POA: Diagnosis not present

## 2018-12-31 DIAGNOSIS — R739 Hyperglycemia, unspecified: Secondary | ICD-10-CM

## 2018-12-31 DIAGNOSIS — E1140 Type 2 diabetes mellitus with diabetic neuropathy, unspecified: Secondary | ICD-10-CM | POA: Insufficient documentation

## 2018-12-31 DIAGNOSIS — R0602 Shortness of breath: Secondary | ICD-10-CM | POA: Diagnosis not present

## 2018-12-31 DIAGNOSIS — M2550 Pain in unspecified joint: Secondary | ICD-10-CM | POA: Diagnosis not present

## 2018-12-31 DIAGNOSIS — R4182 Altered mental status, unspecified: Secondary | ICD-10-CM | POA: Diagnosis present

## 2018-12-31 DIAGNOSIS — I447 Left bundle-branch block, unspecified: Secondary | ICD-10-CM | POA: Diagnosis not present

## 2018-12-31 DIAGNOSIS — M5489 Other dorsalgia: Secondary | ICD-10-CM | POA: Diagnosis not present

## 2018-12-31 DIAGNOSIS — T50904A Poisoning by unspecified drugs, medicaments and biological substances, undetermined, initial encounter: Secondary | ICD-10-CM | POA: Diagnosis not present

## 2018-12-31 DIAGNOSIS — Z79899 Other long term (current) drug therapy: Secondary | ICD-10-CM | POA: Diagnosis not present

## 2018-12-31 DIAGNOSIS — Z7401 Bed confinement status: Secondary | ICD-10-CM | POA: Diagnosis not present

## 2018-12-31 DIAGNOSIS — I110 Hypertensive heart disease with heart failure: Secondary | ICD-10-CM | POA: Insufficient documentation

## 2018-12-31 DIAGNOSIS — R29898 Other symptoms and signs involving the musculoskeletal system: Secondary | ICD-10-CM | POA: Diagnosis not present

## 2018-12-31 DIAGNOSIS — N3000 Acute cystitis without hematuria: Secondary | ICD-10-CM | POA: Diagnosis not present

## 2018-12-31 DIAGNOSIS — E1165 Type 2 diabetes mellitus with hyperglycemia: Secondary | ICD-10-CM | POA: Diagnosis not present

## 2018-12-31 DIAGNOSIS — F17210 Nicotine dependence, cigarettes, uncomplicated: Secondary | ICD-10-CM | POA: Diagnosis not present

## 2018-12-31 DIAGNOSIS — R404 Transient alteration of awareness: Secondary | ICD-10-CM | POA: Diagnosis not present

## 2018-12-31 DIAGNOSIS — R05 Cough: Secondary | ICD-10-CM | POA: Diagnosis not present

## 2018-12-31 LAB — CBC WITH DIFFERENTIAL/PLATELET
Abs Immature Granulocytes: 0.03 10*3/uL (ref 0.00–0.07)
Basophils Absolute: 0 10*3/uL (ref 0.0–0.1)
Basophils Relative: 0 %
Eosinophils Absolute: 0 10*3/uL (ref 0.0–0.5)
Eosinophils Relative: 0 %
HCT: 37.3 % (ref 36.0–46.0)
Hemoglobin: 11.1 g/dL — ABNORMAL LOW (ref 12.0–15.0)
Immature Granulocytes: 0 %
Lymphocytes Relative: 10 %
Lymphs Abs: 0.7 10*3/uL (ref 0.7–4.0)
MCH: 29.5 pg (ref 26.0–34.0)
MCHC: 29.8 g/dL — ABNORMAL LOW (ref 30.0–36.0)
MCV: 99.2 fL (ref 80.0–100.0)
Monocytes Absolute: 0.4 10*3/uL (ref 0.1–1.0)
Monocytes Relative: 5 %
Neutro Abs: 5.9 10*3/uL (ref 1.7–7.7)
Neutrophils Relative %: 85 %
Platelets: 167 10*3/uL (ref 150–400)
RBC: 3.76 MIL/uL — ABNORMAL LOW (ref 3.87–5.11)
RDW: 13.9 % (ref 11.5–15.5)
WBC: 7.1 10*3/uL (ref 4.0–10.5)
nRBC: 0 % (ref 0.0–0.2)

## 2018-12-31 LAB — RAPID URINE DRUG SCREEN, HOSP PERFORMED
Amphetamines: NOT DETECTED
Barbiturates: NOT DETECTED
Benzodiazepines: NOT DETECTED
Cocaine: NOT DETECTED
Opiates: POSITIVE — AB
Tetrahydrocannabinol: NOT DETECTED

## 2018-12-31 LAB — COMPREHENSIVE METABOLIC PANEL
ALT: 36 U/L (ref 0–44)
AST: 59 U/L — ABNORMAL HIGH (ref 15–41)
Albumin: 3.1 g/dL — ABNORMAL LOW (ref 3.5–5.0)
Alkaline Phosphatase: 192 U/L — ABNORMAL HIGH (ref 38–126)
Anion gap: 7 (ref 5–15)
BUN: 35 mg/dL — ABNORMAL HIGH (ref 6–20)
CO2: 25 mmol/L (ref 22–32)
Calcium: 8.1 mg/dL — ABNORMAL LOW (ref 8.9–10.3)
Chloride: 103 mmol/L (ref 98–111)
Creatinine, Ser: 1.42 mg/dL — ABNORMAL HIGH (ref 0.44–1.00)
GFR calc Af Amer: 49 mL/min — ABNORMAL LOW (ref >60)
GFR calc non Af Amer: 43 mL/min — ABNORMAL LOW (ref >60)
Glucose, Bld: 364 mg/dL — ABNORMAL HIGH (ref 70–99)
Potassium: 5.6 mmol/L — ABNORMAL HIGH (ref 3.5–5.1)
Sodium: 135 mmol/L (ref 135–145)
Total Bilirubin: 0.4 mg/dL (ref 0.3–1.2)
Total Protein: 7.6 g/dL (ref 6.5–8.1)

## 2018-12-31 LAB — URINALYSIS, ROUTINE W REFLEX MICROSCOPIC
Bilirubin Urine: NEGATIVE
Glucose, UA: >=500 mg/dL — AB
Hgb urine dipstick: NEGATIVE
Ketones, ur: NEGATIVE mg/dL
Nitrite: POSITIVE — AB
Protein, ur: NEGATIVE mg/dL
Specific Gravity, Urine: 1.009 (ref 1.005–1.030)
pH: 5 (ref 5.0–8.0)

## 2018-12-31 LAB — CBG MONITORING, ED
Glucose-Capillary: 351 mg/dL — ABNORMAL HIGH (ref 70–99)
Glucose-Capillary: 243 mg/dL — ABNORMAL HIGH (ref 70–99)

## 2018-12-31 LAB — POTASSIUM: Potassium: 4.9 mmol/L (ref 3.5–5.1)

## 2018-12-31 LAB — ACETAMINOPHEN LEVEL: Acetaminophen (Tylenol), Serum: <10 ug/mL — ABNORMAL LOW (ref 10–30)

## 2018-12-31 LAB — SALICYLATE LEVEL: Salicylate Lvl: <7 mg/dL (ref 2.8–30.0)

## 2018-12-31 LAB — ETHANOL: Alcohol, Ethyl (B): <10 mg/dL (ref <10)

## 2018-12-31 MED ORDER — SODIUM CHLORIDE 0.9 % IV BOLUS (SEPSIS)
1000.0000 mL | Freq: Once | INTRAVENOUS | Status: AC
  Administered 2018-12-31: 1000 mL via INTRAVENOUS

## 2018-12-31 MED ORDER — INSULIN ASPART 100 UNIT/ML ~~LOC~~ SOLN
5.0000 [IU] | Freq: Once | SUBCUTANEOUS | Status: AC
  Administered 2018-12-31: 5 [IU] via INTRAVENOUS
  Filled 2018-12-31: qty 1

## 2018-12-31 MED ORDER — CEPHALEXIN 500 MG PO CAPS: 500.0000 mg | ORAL_CAPSULE | Freq: Two times a day (BID) | ORAL | 0 refills | Status: DC

## 2018-12-31 MED ORDER — SODIUM CHLORIDE 0.9 % IV SOLN
1.0000 g | Freq: Once | INTRAVENOUS | Status: AC
  Administered 2018-12-31: 1 g via INTRAVENOUS
  Filled 2018-12-31: qty 10

## 2018-12-31 NOTE — ED Provider Notes (Signed)
TIME SEEN: 1:54 AM  CHIEF COMPLAINT: Altered mental status  HPI: Patient is a 53 year old female with history of hypertension, diabetes, hyperlipidemia, CHF, chronic pain who presents to the emergency department with altered mental status.  History is provided by patient, patient's daughter and EMS.  Patient's daughter, Seth Bake, reports that patient looks like she was having a hard time breathing tonight.  They states she was asleep and look like she was "gasping" for air.  Daughter does report the patient has a history of sleep apnea.  They state they splashed water on her and were unable to wake her up.  Called EMS.  EMS states blood sugar read over 500.  Given Narcan by EMS with some improvement in symptoms.  Patient is able to wake up here and tell me that she took 10/325 mg Percocet tablet at 9 PM and then another 1 at 11 PM.  States she takes it for chronic pain from her arthritis.  It appears she had a bottle filled on March 23 for 90 tablets that are now all gone.  She denies any intentional overdose or attempt to hurt herself.  Denies illicit drug or alcohol use.  She does state that she has had a cough for the past 2 weeks and has felt short of breath for 1 week but no fevers or chest pain.  No vomiting or diarrhea.  Denies numbness or weakness.  Denies head injury.  Not on blood thinners.  Daughters: Alleen Borne [phone] Lucious Groves [phone]  ROS: See HPI Constitutional: no fever  Eyes: no drainage  ENT: no runny nose   Cardiovascular:  no chest pain  Resp: no SOB  GI: no vomiting GU: no dysuria Integumentary: no rash  Allergy: no hives  Musculoskeletal: no leg swelling  Neurological: no slurred speech ROS otherwise negative  PAST MEDICAL HISTORY/PAST SURGICAL HISTORY:  Past Medical History:  Diagnosis Date  . Arthritis    "hands, arms, feet, back, knees" (02/17/2018)  . Cardiomyopathy [facility])    From Dr. Bonney Roussel office notes from 01/13/18  . CHF (congestive heart  failure) ([place])   . Chronic lower back pain   . Diabetic neuropathy ([place])   . Family history of adverse reaction to anesthesia    Mother is hard to arouse and blood pressure drops  . GERD (gastroesophageal reflux disease)   . High cholesterol   . Hypertension   . Sleep apnea    "need new machine"  - does not use a cpap (02/17/2018)  . Type II diabetes mellitus ([place])    no longer on medications (02/17/2018)    MEDICATIONS:  Prior to Admission medications   Medication Sig Start Date End Date Taking? Authorizing Provider  aspirin EC 325 MG EC tablet Take 1 tablet (325 mg total) by mouth daily with breakfast. 05/17/18   Newt Minion, MD  atorvastatin (LIPITOR) 40 MG tablet Take 40 mg by mouth daily.    [provider]  furosemide (LASIX) 40 MG tablet Take 1 tablet (40 mg total) by mouth daily. Patient taking differently: Take 20 mg by mouth daily.  11/14/17   Velvet Bathe, MD  lisinopril-hydrochlorothiazide (PRINZIDE,ZESTORETIC) 20-25 MG tablet Take 1 tablet by mouth daily. 01/21/18   [provider]  meloxicam (MOBIC) 15 MG tablet Take 1 tablet (15 mg total) by mouth daily. 09/27/18   Orpah Greek, MD  metoprolol succinate (TOPROL-XL) 25 MG 24 hr tablet Take 37.5 mg by mouth daily.    [provider]  oxyCODONE-acetaminophen (  PERCOCET) 10-325 MG tablet Take 1 tablet by mouth every 8 (eight) hours as needed for pain. 09/30/18   Rayburn, Neta Mends, PA-C  sacubitril-valsartan (ENTRESTO) 24-26 MG Take 1 tablet by mouth 2 (two) times daily.     [provider]    ALLERGIES:  No Known Allergies  SOCIAL HISTORY:  Social History   Tobacco Use  . Smoking status: Current Every Day Smoker    Packs/day: 0.50    Years: 32.00    Pack years: 16.00    Types: Cigarettes  . Smokeless tobacco: Never Used  Substance Use Topics  . Alcohol use: Not Currently    FAMILY HISTORY: Family History  Problem Relation Age of Onset  . Kidney disease Mother   .  Congestive Heart Failure Mother   . Hypertension Father     EXAM: BP 106/72 (BP Location: Left Arm)   Pulse 89   Temp 98.1 F (36.7 C) (Oral)   Resp 18   Ht 5\' 3"  (1.6 m)   Wt (!) 147.4 kg   LMP 11/09/2012   SpO2 100%   BMI 57.57 kg/m  CONSTITUTIONAL: Alert and oriented x3 responds appropriately to questions but easily falls asleep.  She is obese and in no distress.  Drowsy but arousable to voice. HEAD: Normocephalic, atraumatic EYES: Conjunctivae clear, pupils appear equal, EOMI ENT: normal nose; moist mucous membranes NECK: Supple, no meningismus, no nuchal rigidity, no LAD  CARD: RRR; S1 and S2 appreciated; no murmurs, no clicks, no rubs, no gallops RESP: Normal chest excursion without splinting or tachypnea; breath sounds clear and equal bilaterally; no wheezes, no rhonchi, no rales, no hypoxia or respiratory distress, speaking full sentences ABD/GI: Normal bowel sounds; non-distended; soft, non-tender, no rebound, no guarding, no peritoneal signs, no hepatosplenomegaly BACK: Normal range of motion. EXT: Normal ROM in all joints; non-tender to palpation; nonpitting edema in bilateral lower extremities, no major deformity noted SKIN: Normal color for age and race; warm; no rash on exposed skin NEURO: Moves all extremities equally, denies sensory loss diffusely, speech will be intermittently slurred and then normal, no aphasia, cranial nerves II through XII intact PSYCH: The patient's mood and manner are appropriate. Grooming and personal hygiene are appropriate.  Denies suicidal ideation.  MEDICAL DECISION MAKING: Patient here with altered mental status.  Daughters are not completely sure what happened but it seems that she may have taken Percocet at 9 PM and then again at 11 PM.  Blood sugar with EMS was in the 500s.  No obvious sign of focal neurologic deficit other than drowsiness.  She does report some cough and shortness of breath recently but no fever.  Low suspicion for  COVID-19.  Will obtain labs, urine, chest x-ray, CT of the head.  Will monitor closely.  This time I do not feel she needs to be on a Narcan drip.  I have talked to her daughter Seth Bake by phone and updated her.  She is comfortable with this plan.  Patient's EKG shows left bundle branch block which appears similar to previous.  ED PROGRESS: 5:25 AM  Patient's labs show hyperglycemia without signs of DKA.  She is also hypokalemic with potassium of 5.6 without EKG changes.  Will give IV fluids, IV insulin and reassess.  Urine is suggestive of a UTI.  Previous urine cultures have been unremarkable.  We will send urine culture today and give ceftriaxone.  Chest x-ray clear.  CT head negative.  Drug screen positive for opiates.  Creatinine mildly elevated  which appears to be chronic.  She is getting hydrated.  Suspect that this is a combination of unintentional overdose, sleep apnea and UTI.  She will need to be monitored until more awake.  Patient is requiring mild supplemental oxygen but has not required repeat doses of Narcan.  Repeat potassium level is pending.   5:33 AM  Pt's repeat potassium 4.9.  Will be monitored until clinically sober.   Patient will be discharged in the morning when she is more awake, ambulatory, eating or drinking without difficulty.  Will be discharged with prescription of Keflex for her UTI.  Again patient denies that this was intentional in nature.  She will not be given a refill of her narcotic prescription.   At this time, I do not feel there is any life-threatening condition present. I have reviewed and discussed all results (EKG, imaging, lab, urine as appropriate) and exam findings with patient/family. I have reviewed nursing notes and appropriate previous records.  I feel the patient is safe to be discharged home without further emergent workup and can continue workup as an outpatient as needed. Discussed usual and customary return precautions. Patient/family verbalize  understanding and are comfortable with this plan.  Outpatient follow-up has been provided as needed. All questions have been answered.     EKG Interpretation  Date/Time:  Friday December 31 2018 01:40:16 EDT Ventricular Rate:  88 PR Interval:    QRS Duration: 168 QT Interval:  432 QTC Calculation: 523 R Axis:   -74 Text Interpretation:  Sinus rhythm Left bundle branch block No significant change since last tracing Confirmed by Ward, Cyril Mourning [phone]) on 12/31/2018 1:54:47 AM         Ward, Delice Bison, DO 12/31/18 [id number]

## 2018-12-31 NOTE — ED Notes (Signed)
Called [facility] for transport.  

## 2018-12-31 NOTE — ED Notes (Signed)
Pt given a soda and coffee. Tolerated both well with no adverse reactions

## 2018-12-31 NOTE — ED Triage Notes (Addendum)
Pt arriving via [facility] following unintentional overdose of oxycodone. Pts daughters report pt was sitting up talking to them then seemed to "drift off" and began agonal breathing. Pt given 1mg  IM Narcan by EMS prior to arrival. Pt reports being taken off her insulin but CBG with EMS read over 500. Pt awake to speech, drifting in and out of sleep. A&O x4.

## 2018-12-31 NOTE — ED Notes (Signed)
Patient provided with water and instructed to notify RN if feeling nauseous. Unable to ambulate patient at this time due to drowsiness. MD made aware. Will continue to monitor patient.

## 2018-12-31 NOTE — ED Provider Notes (Signed)
9:30 AM Patient awake, alert, normotensive, has recovered appropriately from her episode. Patient discharged with instructions on appropriate medication use outpatient follow-up.   Carmin Muskrat, MD 12/31/18 [phone]

## 2018-12-31 NOTE — ED Notes (Signed)
Bed: QU41 Expected date:  Expected time:  Means of arrival:  Comments: EMS 53 yo female from home/"dozed off"-slow agonal resp-4-6/min-pupils pinpoint-on oxy for pain-narcan-now more responisive 145/85 HR 110

## 2018-12-31 NOTE — Discharge Instructions (Addendum)
Use your medication only as directed.  Return here for concerning changes in your condition.

## 2018-12-31 NOTE — ED Notes (Signed)
Attempted to ambulate pt.  Pt stated she wasn't comfortable ambulating here, she has all the equipment she needs at home, as well as in home PT and home health.  Pt states she feels comfortable going home.   MD made aware

## 2019-01-02 LAB — URINE CULTURE: Culture: >=100000 — AB

## 2019-01-03 ENCOUNTER — Telehealth: Payer: Self-pay

## 2019-01-03 NOTE — Telephone Encounter (Signed)
Post ED Visit - Positive Culture Follow-up  Culture report reviewed by antimicrobial stewardship pharmacist: [place] Team []  Elenor Quinones, Pharm.D. []  Heide Guile, Pharm.D., BCPS AQ-ID []  Parks Neptune, Pharm.D., BCPS []  Alycia Rossetti, Pharm.D., BCPS []  [place], Pharm.D., BCPS, AAHIVP []  Legrand Como, Pharm.D., BCPS, AAHIVP []  Salome Arnt, PharmD, BCPS []  Johnnette Gourd, PharmD, BCPS []  Hughes Better, PharmD, BCPS []  Leeroy Cha, PharmD []  Laqueta Linden, PharmD, BCPS []  Albertina Parr, PharmD  [place] Team []  Leodis Sias, PharmD []  Lindell Spar, PharmD []  Royetta Asal, PharmD [x]  Graylin Shiver, Rph []  Rema Fendt) Glennon Mac, PharmD []  Arlyn Dunning, PharmD []  Netta Cedars, PharmD []  Dia Sitter, PharmD []  Leone Haven, PharmD []  Gretta Arab, PharmD []  Theodis Shove, PharmD []  Peggyann Juba, PharmD []  Reuel Boom, PharmD   Positive urine culture Treated with Cephalexin, organism sensitive to the same and no further patient follow-up is required at this time.  Genia Del 01/03/2019, 10:13 AM

## 2019-01-06 DIAGNOSIS — M47817 Spondylosis without myelopathy or radiculopathy, lumbosacral region: Secondary | ICD-10-CM | POA: Diagnosis not present

## 2019-01-06 DIAGNOSIS — M79672 Pain in left foot: Secondary | ICD-10-CM | POA: Diagnosis not present

## 2019-01-06 DIAGNOSIS — M25512 Pain in left shoulder: Secondary | ICD-10-CM | POA: Diagnosis not present

## 2019-01-06 DIAGNOSIS — M25572 Pain in left ankle and joints of left foot: Secondary | ICD-10-CM | POA: Diagnosis not present

## 2019-01-15 DIAGNOSIS — S83104A Unspecified dislocation of right knee, initial encounter: Secondary | ICD-10-CM | POA: Diagnosis not present

## 2019-01-15 DIAGNOSIS — Q762 Congenital spondylolisthesis: Secondary | ICD-10-CM | POA: Diagnosis not present

## 2019-01-15 DIAGNOSIS — Z96651 Presence of right artificial knee joint: Secondary | ICD-10-CM | POA: Diagnosis not present

## 2019-01-17 DIAGNOSIS — T84012D Broken internal right knee prosthesis, subsequent encounter: Secondary | ICD-10-CM | POA: Diagnosis not present

## 2019-01-17 DIAGNOSIS — Z9181 History of falling: Secondary | ICD-10-CM | POA: Diagnosis not present

## 2019-01-17 DIAGNOSIS — I509 Heart failure, unspecified: Secondary | ICD-10-CM | POA: Diagnosis not present

## 2019-01-17 DIAGNOSIS — E1122 Type 2 diabetes mellitus with diabetic chronic kidney disease: Secondary | ICD-10-CM | POA: Diagnosis not present

## 2019-01-17 DIAGNOSIS — E785 Hyperlipidemia, unspecified: Secondary | ICD-10-CM | POA: Diagnosis not present

## 2019-01-17 DIAGNOSIS — T84028D Dislocation of other internal joint prosthesis, subsequent encounter: Secondary | ICD-10-CM | POA: Diagnosis not present

## 2019-01-17 DIAGNOSIS — Z87891 Personal history of nicotine dependence: Secondary | ICD-10-CM | POA: Diagnosis not present

## 2019-01-17 DIAGNOSIS — K219 Gastro-esophageal reflux disease without esophagitis: Secondary | ICD-10-CM | POA: Diagnosis not present

## 2019-01-17 DIAGNOSIS — I130 Hypertensive heart and chronic kidney disease with heart failure and stage 1 through stage 4 chronic kidney disease, or unspecified chronic kidney disease: Secondary | ICD-10-CM | POA: Diagnosis not present

## 2019-01-17 DIAGNOSIS — N182 Chronic kidney disease, stage 2 (mild): Secondary | ICD-10-CM | POA: Diagnosis not present

## 2019-01-17 DIAGNOSIS — G4733 Obstructive sleep apnea (adult) (pediatric): Secondary | ICD-10-CM | POA: Diagnosis not present

## 2019-01-17 DIAGNOSIS — E1140 Type 2 diabetes mellitus with diabetic neuropathy, unspecified: Secondary | ICD-10-CM | POA: Diagnosis not present

## 2019-01-20 ENCOUNTER — Ambulatory Visit: Payer: Self-pay | Admitting: Orthopedic Surgery

## 2019-01-20 ENCOUNTER — Ambulatory Visit (INDEPENDENT_AMBULATORY_CARE_PROVIDER_SITE_OTHER): Payer: Medicare Other | Admitting: Physician Assistant

## 2019-01-21 ENCOUNTER — Telehealth: Payer: Self-pay | Admitting: Orthopedic Surgery

## 2019-01-21 DIAGNOSIS — Z87891 Personal history of nicotine dependence: Secondary | ICD-10-CM | POA: Diagnosis not present

## 2019-01-21 DIAGNOSIS — T84028D Dislocation of other internal joint prosthesis, subsequent encounter: Secondary | ICD-10-CM | POA: Diagnosis not present

## 2019-01-21 DIAGNOSIS — G4733 Obstructive sleep apnea (adult) (pediatric): Secondary | ICD-10-CM | POA: Diagnosis not present

## 2019-01-21 DIAGNOSIS — K219 Gastro-esophageal reflux disease without esophagitis: Secondary | ICD-10-CM | POA: Diagnosis not present

## 2019-01-21 DIAGNOSIS — E1122 Type 2 diabetes mellitus with diabetic chronic kidney disease: Secondary | ICD-10-CM | POA: Diagnosis not present

## 2019-01-21 DIAGNOSIS — I509 Heart failure, unspecified: Secondary | ICD-10-CM | POA: Diagnosis not present

## 2019-01-21 DIAGNOSIS — T84012D Broken internal right knee prosthesis, subsequent encounter: Secondary | ICD-10-CM | POA: Diagnosis not present

## 2019-01-21 DIAGNOSIS — E1140 Type 2 diabetes mellitus with diabetic neuropathy, unspecified: Secondary | ICD-10-CM | POA: Diagnosis not present

## 2019-01-21 DIAGNOSIS — N182 Chronic kidney disease, stage 2 (mild): Secondary | ICD-10-CM | POA: Diagnosis not present

## 2019-01-21 DIAGNOSIS — I130 Hypertensive heart and chronic kidney disease with heart failure and stage 1 through stage 4 chronic kidney disease, or unspecified chronic kidney disease: Secondary | ICD-10-CM | POA: Diagnosis not present

## 2019-01-21 DIAGNOSIS — Z9181 History of falling: Secondary | ICD-10-CM | POA: Diagnosis not present

## 2019-01-21 DIAGNOSIS — E785 Hyperlipidemia, unspecified: Secondary | ICD-10-CM | POA: Diagnosis not present

## 2019-01-21 NOTE — Telephone Encounter (Signed)
Jim Occupational Ph from [facility] called in to get verbal order to resume Occupational therapy again next week, he needs erbal order for 2 times a week for 4 weeks and 1 time a week for 1 week. Also needs Physical therapy order for Pt for 1 time a week for 1 week.  [phone]

## 2019-01-21 NOTE — Telephone Encounter (Signed)
Returned call and VO was given for patient

## 2019-01-25 DIAGNOSIS — I429 Cardiomyopathy, unspecified: Secondary | ICD-10-CM | POA: Diagnosis not present

## 2019-01-25 DIAGNOSIS — E1122 Type 2 diabetes mellitus with diabetic chronic kidney disease: Secondary | ICD-10-CM | POA: Diagnosis not present

## 2019-01-25 DIAGNOSIS — I509 Heart failure, unspecified: Secondary | ICD-10-CM | POA: Diagnosis not present

## 2019-01-25 DIAGNOSIS — M47819 Spondylosis without myelopathy or radiculopathy, site unspecified: Secondary | ICD-10-CM | POA: Diagnosis not present

## 2019-01-25 DIAGNOSIS — I130 Hypertensive heart and chronic kidney disease with heart failure and stage 1 through stage 4 chronic kidney disease, or unspecified chronic kidney disease: Secondary | ICD-10-CM | POA: Diagnosis not present

## 2019-01-25 DIAGNOSIS — E785 Hyperlipidemia, unspecified: Secondary | ICD-10-CM | POA: Diagnosis not present

## 2019-01-25 DIAGNOSIS — E1140 Type 2 diabetes mellitus with diabetic neuropathy, unspecified: Secondary | ICD-10-CM | POA: Diagnosis not present

## 2019-01-25 DIAGNOSIS — G4733 Obstructive sleep apnea (adult) (pediatric): Secondary | ICD-10-CM | POA: Diagnosis not present

## 2019-01-25 DIAGNOSIS — N182 Chronic kidney disease, stage 2 (mild): Secondary | ICD-10-CM | POA: Diagnosis not present

## 2019-01-25 DIAGNOSIS — Z981 Arthrodesis status: Secondary | ICD-10-CM | POA: Diagnosis not present

## 2019-01-25 DIAGNOSIS — M19071 Primary osteoarthritis, right ankle and foot: Secondary | ICD-10-CM | POA: Diagnosis not present

## 2019-01-25 DIAGNOSIS — M19042 Primary osteoarthritis, left hand: Secondary | ICD-10-CM | POA: Diagnosis not present

## 2019-01-25 DIAGNOSIS — M19041 Primary osteoarthritis, right hand: Secondary | ICD-10-CM | POA: Diagnosis not present

## 2019-01-25 DIAGNOSIS — M19072 Primary osteoarthritis, left ankle and foot: Secondary | ICD-10-CM | POA: Diagnosis not present

## 2019-01-26 DIAGNOSIS — E785 Hyperlipidemia, unspecified: Secondary | ICD-10-CM | POA: Diagnosis not present

## 2019-01-26 DIAGNOSIS — Z981 Arthrodesis status: Secondary | ICD-10-CM | POA: Diagnosis not present

## 2019-01-26 DIAGNOSIS — M47819 Spondylosis without myelopathy or radiculopathy, site unspecified: Secondary | ICD-10-CM | POA: Diagnosis not present

## 2019-01-26 DIAGNOSIS — M19072 Primary osteoarthritis, left ankle and foot: Secondary | ICD-10-CM | POA: Diagnosis not present

## 2019-01-26 DIAGNOSIS — I509 Heart failure, unspecified: Secondary | ICD-10-CM | POA: Diagnosis not present

## 2019-01-26 DIAGNOSIS — I130 Hypertensive heart and chronic kidney disease with heart failure and stage 1 through stage 4 chronic kidney disease, or unspecified chronic kidney disease: Secondary | ICD-10-CM | POA: Diagnosis not present

## 2019-01-26 DIAGNOSIS — E1122 Type 2 diabetes mellitus with diabetic chronic kidney disease: Secondary | ICD-10-CM | POA: Diagnosis not present

## 2019-01-26 DIAGNOSIS — I429 Cardiomyopathy, unspecified: Secondary | ICD-10-CM | POA: Diagnosis not present

## 2019-01-26 DIAGNOSIS — M19042 Primary osteoarthritis, left hand: Secondary | ICD-10-CM | POA: Diagnosis not present

## 2019-01-26 DIAGNOSIS — M19071 Primary osteoarthritis, right ankle and foot: Secondary | ICD-10-CM | POA: Diagnosis not present

## 2019-01-26 DIAGNOSIS — M19041 Primary osteoarthritis, right hand: Secondary | ICD-10-CM | POA: Diagnosis not present

## 2019-01-26 DIAGNOSIS — N182 Chronic kidney disease, stage 2 (mild): Secondary | ICD-10-CM | POA: Diagnosis not present

## 2019-01-26 DIAGNOSIS — E1140 Type 2 diabetes mellitus with diabetic neuropathy, unspecified: Secondary | ICD-10-CM | POA: Diagnosis not present

## 2019-01-26 DIAGNOSIS — G4733 Obstructive sleep apnea (adult) (pediatric): Secondary | ICD-10-CM | POA: Diagnosis not present

## 2019-01-27 DIAGNOSIS — S83104A Unspecified dislocation of right knee, initial encounter: Secondary | ICD-10-CM | POA: Diagnosis not present

## 2019-01-27 DIAGNOSIS — Z96651 Presence of right artificial knee joint: Secondary | ICD-10-CM | POA: Diagnosis not present

## 2019-01-27 DIAGNOSIS — Q762 Congenital spondylolisthesis: Secondary | ICD-10-CM | POA: Diagnosis not present

## 2019-01-28 ENCOUNTER — Telehealth: Payer: Self-pay | Admitting: Orthopedic Surgery

## 2019-01-28 NOTE — Telephone Encounter (Signed)
Requesting home PT for possibly 4 weeks

## 2019-01-28 NOTE — Telephone Encounter (Signed)
I advised [name] that patient can continue PT for 4 weeks and also can participate in PT without immobilizer.[name] understood VO.

## 2019-01-31 DIAGNOSIS — I509 Heart failure, unspecified: Secondary | ICD-10-CM | POA: Diagnosis not present

## 2019-01-31 DIAGNOSIS — E785 Hyperlipidemia, unspecified: Secondary | ICD-10-CM | POA: Diagnosis not present

## 2019-01-31 DIAGNOSIS — M19072 Primary osteoarthritis, left ankle and foot: Secondary | ICD-10-CM | POA: Diagnosis not present

## 2019-01-31 DIAGNOSIS — M19071 Primary osteoarthritis, right ankle and foot: Secondary | ICD-10-CM | POA: Diagnosis not present

## 2019-01-31 DIAGNOSIS — G4733 Obstructive sleep apnea (adult) (pediatric): Secondary | ICD-10-CM | POA: Diagnosis not present

## 2019-01-31 DIAGNOSIS — I130 Hypertensive heart and chronic kidney disease with heart failure and stage 1 through stage 4 chronic kidney disease, or unspecified chronic kidney disease: Secondary | ICD-10-CM | POA: Diagnosis not present

## 2019-01-31 DIAGNOSIS — Z981 Arthrodesis status: Secondary | ICD-10-CM | POA: Diagnosis not present

## 2019-01-31 DIAGNOSIS — N182 Chronic kidney disease, stage 2 (mild): Secondary | ICD-10-CM | POA: Diagnosis not present

## 2019-01-31 DIAGNOSIS — M19042 Primary osteoarthritis, left hand: Secondary | ICD-10-CM | POA: Diagnosis not present

## 2019-01-31 DIAGNOSIS — M47819 Spondylosis without myelopathy or radiculopathy, site unspecified: Secondary | ICD-10-CM | POA: Diagnosis not present

## 2019-01-31 DIAGNOSIS — I429 Cardiomyopathy, unspecified: Secondary | ICD-10-CM | POA: Diagnosis not present

## 2019-01-31 DIAGNOSIS — E1140 Type 2 diabetes mellitus with diabetic neuropathy, unspecified: Secondary | ICD-10-CM | POA: Diagnosis not present

## 2019-01-31 DIAGNOSIS — M19041 Primary osteoarthritis, right hand: Secondary | ICD-10-CM | POA: Diagnosis not present

## 2019-01-31 DIAGNOSIS — E1122 Type 2 diabetes mellitus with diabetic chronic kidney disease: Secondary | ICD-10-CM | POA: Diagnosis not present

## 2019-02-03 DIAGNOSIS — M47819 Spondylosis without myelopathy or radiculopathy, site unspecified: Secondary | ICD-10-CM | POA: Diagnosis not present

## 2019-02-03 DIAGNOSIS — M79672 Pain in left foot: Secondary | ICD-10-CM | POA: Diagnosis not present

## 2019-02-03 DIAGNOSIS — Z1159 Encounter for screening for other viral diseases: Secondary | ICD-10-CM | POA: Diagnosis not present

## 2019-02-03 DIAGNOSIS — I130 Hypertensive heart and chronic kidney disease with heart failure and stage 1 through stage 4 chronic kidney disease, or unspecified chronic kidney disease: Secondary | ICD-10-CM | POA: Diagnosis not present

## 2019-02-03 DIAGNOSIS — I429 Cardiomyopathy, unspecified: Secondary | ICD-10-CM | POA: Diagnosis not present

## 2019-02-03 DIAGNOSIS — M25512 Pain in left shoulder: Secondary | ICD-10-CM | POA: Diagnosis not present

## 2019-02-03 DIAGNOSIS — M19042 Primary osteoarthritis, left hand: Secondary | ICD-10-CM | POA: Diagnosis not present

## 2019-02-03 DIAGNOSIS — M19041 Primary osteoarthritis, right hand: Secondary | ICD-10-CM | POA: Diagnosis not present

## 2019-02-03 DIAGNOSIS — M25572 Pain in left ankle and joints of left foot: Secondary | ICD-10-CM | POA: Diagnosis not present

## 2019-02-03 DIAGNOSIS — E1140 Type 2 diabetes mellitus with diabetic neuropathy, unspecified: Secondary | ICD-10-CM | POA: Diagnosis not present

## 2019-02-03 DIAGNOSIS — M129 Arthropathy, unspecified: Secondary | ICD-10-CM | POA: Diagnosis not present

## 2019-02-03 DIAGNOSIS — E785 Hyperlipidemia, unspecified: Secondary | ICD-10-CM | POA: Diagnosis not present

## 2019-02-03 DIAGNOSIS — M19071 Primary osteoarthritis, right ankle and foot: Secondary | ICD-10-CM | POA: Diagnosis not present

## 2019-02-03 DIAGNOSIS — Z981 Arthrodesis status: Secondary | ICD-10-CM | POA: Diagnosis not present

## 2019-02-03 DIAGNOSIS — M47817 Spondylosis without myelopathy or radiculopathy, lumbosacral region: Secondary | ICD-10-CM | POA: Diagnosis not present

## 2019-02-03 DIAGNOSIS — M19072 Primary osteoarthritis, left ankle and foot: Secondary | ICD-10-CM | POA: Diagnosis not present

## 2019-02-03 DIAGNOSIS — E559 Vitamin D deficiency, unspecified: Secondary | ICD-10-CM | POA: Diagnosis not present

## 2019-02-03 DIAGNOSIS — Z79899 Other long term (current) drug therapy: Secondary | ICD-10-CM | POA: Diagnosis not present

## 2019-02-03 DIAGNOSIS — G4733 Obstructive sleep apnea (adult) (pediatric): Secondary | ICD-10-CM | POA: Diagnosis not present

## 2019-02-03 DIAGNOSIS — I10 Essential (primary) hypertension: Secondary | ICD-10-CM | POA: Diagnosis not present

## 2019-02-03 DIAGNOSIS — N182 Chronic kidney disease, stage 2 (mild): Secondary | ICD-10-CM | POA: Diagnosis not present

## 2019-02-03 DIAGNOSIS — E1122 Type 2 diabetes mellitus with diabetic chronic kidney disease: Secondary | ICD-10-CM | POA: Diagnosis not present

## 2019-02-03 DIAGNOSIS — I509 Heart failure, unspecified: Secondary | ICD-10-CM | POA: Diagnosis not present

## 2019-02-04 DIAGNOSIS — I130 Hypertensive heart and chronic kidney disease with heart failure and stage 1 through stage 4 chronic kidney disease, or unspecified chronic kidney disease: Secondary | ICD-10-CM | POA: Diagnosis not present

## 2019-02-04 DIAGNOSIS — M19042 Primary osteoarthritis, left hand: Secondary | ICD-10-CM | POA: Diagnosis not present

## 2019-02-04 DIAGNOSIS — M19072 Primary osteoarthritis, left ankle and foot: Secondary | ICD-10-CM | POA: Diagnosis not present

## 2019-02-04 DIAGNOSIS — I509 Heart failure, unspecified: Secondary | ICD-10-CM | POA: Diagnosis not present

## 2019-02-04 DIAGNOSIS — I429 Cardiomyopathy, unspecified: Secondary | ICD-10-CM | POA: Diagnosis not present

## 2019-02-04 DIAGNOSIS — E1140 Type 2 diabetes mellitus with diabetic neuropathy, unspecified: Secondary | ICD-10-CM | POA: Diagnosis not present

## 2019-02-04 DIAGNOSIS — M19071 Primary osteoarthritis, right ankle and foot: Secondary | ICD-10-CM | POA: Diagnosis not present

## 2019-02-04 DIAGNOSIS — Z981 Arthrodesis status: Secondary | ICD-10-CM | POA: Diagnosis not present

## 2019-02-04 DIAGNOSIS — E785 Hyperlipidemia, unspecified: Secondary | ICD-10-CM | POA: Diagnosis not present

## 2019-02-04 DIAGNOSIS — M19041 Primary osteoarthritis, right hand: Secondary | ICD-10-CM | POA: Diagnosis not present

## 2019-02-04 DIAGNOSIS — M47819 Spondylosis without myelopathy or radiculopathy, site unspecified: Secondary | ICD-10-CM | POA: Diagnosis not present

## 2019-02-04 DIAGNOSIS — N182 Chronic kidney disease, stage 2 (mild): Secondary | ICD-10-CM | POA: Diagnosis not present

## 2019-02-04 DIAGNOSIS — G4733 Obstructive sleep apnea (adult) (pediatric): Secondary | ICD-10-CM | POA: Diagnosis not present

## 2019-02-04 DIAGNOSIS — E1122 Type 2 diabetes mellitus with diabetic chronic kidney disease: Secondary | ICD-10-CM | POA: Diagnosis not present

## 2019-02-08 DIAGNOSIS — E785 Hyperlipidemia, unspecified: Secondary | ICD-10-CM | POA: Diagnosis not present

## 2019-02-08 DIAGNOSIS — Z981 Arthrodesis status: Secondary | ICD-10-CM | POA: Diagnosis not present

## 2019-02-08 DIAGNOSIS — M47819 Spondylosis without myelopathy or radiculopathy, site unspecified: Secondary | ICD-10-CM | POA: Diagnosis not present

## 2019-02-08 DIAGNOSIS — E1140 Type 2 diabetes mellitus with diabetic neuropathy, unspecified: Secondary | ICD-10-CM | POA: Diagnosis not present

## 2019-02-08 DIAGNOSIS — M19041 Primary osteoarthritis, right hand: Secondary | ICD-10-CM | POA: Diagnosis not present

## 2019-02-08 DIAGNOSIS — I509 Heart failure, unspecified: Secondary | ICD-10-CM | POA: Diagnosis not present

## 2019-02-08 DIAGNOSIS — I130 Hypertensive heart and chronic kidney disease with heart failure and stage 1 through stage 4 chronic kidney disease, or unspecified chronic kidney disease: Secondary | ICD-10-CM | POA: Diagnosis not present

## 2019-02-08 DIAGNOSIS — I429 Cardiomyopathy, unspecified: Secondary | ICD-10-CM | POA: Diagnosis not present

## 2019-02-08 DIAGNOSIS — M19072 Primary osteoarthritis, left ankle and foot: Secondary | ICD-10-CM | POA: Diagnosis not present

## 2019-02-08 DIAGNOSIS — G4733 Obstructive sleep apnea (adult) (pediatric): Secondary | ICD-10-CM | POA: Diagnosis not present

## 2019-02-08 DIAGNOSIS — N182 Chronic kidney disease, stage 2 (mild): Secondary | ICD-10-CM | POA: Diagnosis not present

## 2019-02-08 DIAGNOSIS — M19042 Primary osteoarthritis, left hand: Secondary | ICD-10-CM | POA: Diagnosis not present

## 2019-02-08 DIAGNOSIS — M19071 Primary osteoarthritis, right ankle and foot: Secondary | ICD-10-CM | POA: Diagnosis not present

## 2019-02-08 DIAGNOSIS — E1122 Type 2 diabetes mellitus with diabetic chronic kidney disease: Secondary | ICD-10-CM | POA: Diagnosis not present

## 2019-02-09 DIAGNOSIS — M47819 Spondylosis without myelopathy or radiculopathy, site unspecified: Secondary | ICD-10-CM | POA: Diagnosis not present

## 2019-02-09 DIAGNOSIS — E1122 Type 2 diabetes mellitus with diabetic chronic kidney disease: Secondary | ICD-10-CM | POA: Diagnosis not present

## 2019-02-09 DIAGNOSIS — M19042 Primary osteoarthritis, left hand: Secondary | ICD-10-CM | POA: Diagnosis not present

## 2019-02-09 DIAGNOSIS — I429 Cardiomyopathy, unspecified: Secondary | ICD-10-CM | POA: Diagnosis not present

## 2019-02-09 DIAGNOSIS — I130 Hypertensive heart and chronic kidney disease with heart failure and stage 1 through stage 4 chronic kidney disease, or unspecified chronic kidney disease: Secondary | ICD-10-CM | POA: Diagnosis not present

## 2019-02-09 DIAGNOSIS — M19071 Primary osteoarthritis, right ankle and foot: Secondary | ICD-10-CM | POA: Diagnosis not present

## 2019-02-09 DIAGNOSIS — E785 Hyperlipidemia, unspecified: Secondary | ICD-10-CM | POA: Diagnosis not present

## 2019-02-09 DIAGNOSIS — G4733 Obstructive sleep apnea (adult) (pediatric): Secondary | ICD-10-CM | POA: Diagnosis not present

## 2019-02-09 DIAGNOSIS — N182 Chronic kidney disease, stage 2 (mild): Secondary | ICD-10-CM | POA: Diagnosis not present

## 2019-02-09 DIAGNOSIS — I509 Heart failure, unspecified: Secondary | ICD-10-CM | POA: Diagnosis not present

## 2019-02-09 DIAGNOSIS — M19072 Primary osteoarthritis, left ankle and foot: Secondary | ICD-10-CM | POA: Diagnosis not present

## 2019-02-09 DIAGNOSIS — Z981 Arthrodesis status: Secondary | ICD-10-CM | POA: Diagnosis not present

## 2019-02-09 DIAGNOSIS — M19041 Primary osteoarthritis, right hand: Secondary | ICD-10-CM | POA: Diagnosis not present

## 2019-02-09 DIAGNOSIS — E1140 Type 2 diabetes mellitus with diabetic neuropathy, unspecified: Secondary | ICD-10-CM | POA: Diagnosis not present

## 2019-02-10 DIAGNOSIS — M19071 Primary osteoarthritis, right ankle and foot: Secondary | ICD-10-CM | POA: Diagnosis not present

## 2019-02-10 DIAGNOSIS — M19042 Primary osteoarthritis, left hand: Secondary | ICD-10-CM | POA: Diagnosis not present

## 2019-02-10 DIAGNOSIS — I509 Heart failure, unspecified: Secondary | ICD-10-CM | POA: Diagnosis not present

## 2019-02-10 DIAGNOSIS — M19072 Primary osteoarthritis, left ankle and foot: Secondary | ICD-10-CM | POA: Diagnosis not present

## 2019-02-10 DIAGNOSIS — G4733 Obstructive sleep apnea (adult) (pediatric): Secondary | ICD-10-CM | POA: Diagnosis not present

## 2019-02-10 DIAGNOSIS — I130 Hypertensive heart and chronic kidney disease with heart failure and stage 1 through stage 4 chronic kidney disease, or unspecified chronic kidney disease: Secondary | ICD-10-CM | POA: Diagnosis not present

## 2019-02-10 DIAGNOSIS — Z981 Arthrodesis status: Secondary | ICD-10-CM | POA: Diagnosis not present

## 2019-02-10 DIAGNOSIS — M47819 Spondylosis without myelopathy or radiculopathy, site unspecified: Secondary | ICD-10-CM | POA: Diagnosis not present

## 2019-02-10 DIAGNOSIS — E1140 Type 2 diabetes mellitus with diabetic neuropathy, unspecified: Secondary | ICD-10-CM | POA: Diagnosis not present

## 2019-02-10 DIAGNOSIS — M19041 Primary osteoarthritis, right hand: Secondary | ICD-10-CM | POA: Diagnosis not present

## 2019-02-10 DIAGNOSIS — E1122 Type 2 diabetes mellitus with diabetic chronic kidney disease: Secondary | ICD-10-CM | POA: Diagnosis not present

## 2019-02-10 DIAGNOSIS — E785 Hyperlipidemia, unspecified: Secondary | ICD-10-CM | POA: Diagnosis not present

## 2019-02-10 DIAGNOSIS — I429 Cardiomyopathy, unspecified: Secondary | ICD-10-CM | POA: Diagnosis not present

## 2019-02-10 DIAGNOSIS — N182 Chronic kidney disease, stage 2 (mild): Secondary | ICD-10-CM | POA: Diagnosis not present

## 2019-02-14 DIAGNOSIS — M47819 Spondylosis without myelopathy or radiculopathy, site unspecified: Secondary | ICD-10-CM | POA: Diagnosis not present

## 2019-02-14 DIAGNOSIS — N182 Chronic kidney disease, stage 2 (mild): Secondary | ICD-10-CM | POA: Diagnosis not present

## 2019-02-14 DIAGNOSIS — E785 Hyperlipidemia, unspecified: Secondary | ICD-10-CM | POA: Diagnosis not present

## 2019-02-14 DIAGNOSIS — M19071 Primary osteoarthritis, right ankle and foot: Secondary | ICD-10-CM | POA: Diagnosis not present

## 2019-02-14 DIAGNOSIS — Z981 Arthrodesis status: Secondary | ICD-10-CM | POA: Diagnosis not present

## 2019-02-14 DIAGNOSIS — I130 Hypertensive heart and chronic kidney disease with heart failure and stage 1 through stage 4 chronic kidney disease, or unspecified chronic kidney disease: Secondary | ICD-10-CM | POA: Diagnosis not present

## 2019-02-14 DIAGNOSIS — M19042 Primary osteoarthritis, left hand: Secondary | ICD-10-CM | POA: Diagnosis not present

## 2019-02-14 DIAGNOSIS — E1122 Type 2 diabetes mellitus with diabetic chronic kidney disease: Secondary | ICD-10-CM | POA: Diagnosis not present

## 2019-02-14 DIAGNOSIS — I509 Heart failure, unspecified: Secondary | ICD-10-CM | POA: Diagnosis not present

## 2019-02-14 DIAGNOSIS — I429 Cardiomyopathy, unspecified: Secondary | ICD-10-CM | POA: Diagnosis not present

## 2019-02-14 DIAGNOSIS — E1140 Type 2 diabetes mellitus with diabetic neuropathy, unspecified: Secondary | ICD-10-CM | POA: Diagnosis not present

## 2019-02-14 DIAGNOSIS — G4733 Obstructive sleep apnea (adult) (pediatric): Secondary | ICD-10-CM | POA: Diagnosis not present

## 2019-02-14 DIAGNOSIS — M19072 Primary osteoarthritis, left ankle and foot: Secondary | ICD-10-CM | POA: Diagnosis not present

## 2019-02-14 DIAGNOSIS — M19041 Primary osteoarthritis, right hand: Secondary | ICD-10-CM | POA: Diagnosis not present

## 2019-02-15 DIAGNOSIS — M47819 Spondylosis without myelopathy or radiculopathy, site unspecified: Secondary | ICD-10-CM | POA: Diagnosis not present

## 2019-02-15 DIAGNOSIS — I130 Hypertensive heart and chronic kidney disease with heart failure and stage 1 through stage 4 chronic kidney disease, or unspecified chronic kidney disease: Secondary | ICD-10-CM | POA: Diagnosis not present

## 2019-02-15 DIAGNOSIS — S83104A Unspecified dislocation of right knee, initial encounter: Secondary | ICD-10-CM | POA: Diagnosis not present

## 2019-02-15 DIAGNOSIS — G4733 Obstructive sleep apnea (adult) (pediatric): Secondary | ICD-10-CM | POA: Diagnosis not present

## 2019-02-15 DIAGNOSIS — M19072 Primary osteoarthritis, left ankle and foot: Secondary | ICD-10-CM | POA: Diagnosis not present

## 2019-02-15 DIAGNOSIS — E1140 Type 2 diabetes mellitus with diabetic neuropathy, unspecified: Secondary | ICD-10-CM | POA: Diagnosis not present

## 2019-02-15 DIAGNOSIS — E1122 Type 2 diabetes mellitus with diabetic chronic kidney disease: Secondary | ICD-10-CM | POA: Diagnosis not present

## 2019-02-15 DIAGNOSIS — I429 Cardiomyopathy, unspecified: Secondary | ICD-10-CM | POA: Diagnosis not present

## 2019-02-15 DIAGNOSIS — M19042 Primary osteoarthritis, left hand: Secondary | ICD-10-CM | POA: Diagnosis not present

## 2019-02-15 DIAGNOSIS — M19041 Primary osteoarthritis, right hand: Secondary | ICD-10-CM | POA: Diagnosis not present

## 2019-02-15 DIAGNOSIS — Q762 Congenital spondylolisthesis: Secondary | ICD-10-CM | POA: Diagnosis not present

## 2019-02-15 DIAGNOSIS — Z981 Arthrodesis status: Secondary | ICD-10-CM | POA: Diagnosis not present

## 2019-02-15 DIAGNOSIS — I509 Heart failure, unspecified: Secondary | ICD-10-CM | POA: Diagnosis not present

## 2019-02-15 DIAGNOSIS — E785 Hyperlipidemia, unspecified: Secondary | ICD-10-CM | POA: Diagnosis not present

## 2019-02-15 DIAGNOSIS — M19071 Primary osteoarthritis, right ankle and foot: Secondary | ICD-10-CM | POA: Diagnosis not present

## 2019-02-15 DIAGNOSIS — N182 Chronic kidney disease, stage 2 (mild): Secondary | ICD-10-CM | POA: Diagnosis not present

## 2019-02-15 DIAGNOSIS — Z96651 Presence of right artificial knee joint: Secondary | ICD-10-CM | POA: Diagnosis not present

## 2019-02-16 DIAGNOSIS — E1140 Type 2 diabetes mellitus with diabetic neuropathy, unspecified: Secondary | ICD-10-CM | POA: Diagnosis not present

## 2019-02-16 DIAGNOSIS — M19041 Primary osteoarthritis, right hand: Secondary | ICD-10-CM | POA: Diagnosis not present

## 2019-02-16 DIAGNOSIS — I130 Hypertensive heart and chronic kidney disease with heart failure and stage 1 through stage 4 chronic kidney disease, or unspecified chronic kidney disease: Secondary | ICD-10-CM | POA: Diagnosis not present

## 2019-02-16 DIAGNOSIS — M47819 Spondylosis without myelopathy or radiculopathy, site unspecified: Secondary | ICD-10-CM | POA: Diagnosis not present

## 2019-02-16 DIAGNOSIS — Z981 Arthrodesis status: Secondary | ICD-10-CM | POA: Diagnosis not present

## 2019-02-16 DIAGNOSIS — M19071 Primary osteoarthritis, right ankle and foot: Secondary | ICD-10-CM | POA: Diagnosis not present

## 2019-02-16 DIAGNOSIS — M19042 Primary osteoarthritis, left hand: Secondary | ICD-10-CM | POA: Diagnosis not present

## 2019-02-16 DIAGNOSIS — M19072 Primary osteoarthritis, left ankle and foot: Secondary | ICD-10-CM | POA: Diagnosis not present

## 2019-02-16 DIAGNOSIS — I429 Cardiomyopathy, unspecified: Secondary | ICD-10-CM | POA: Diagnosis not present

## 2019-02-16 DIAGNOSIS — I509 Heart failure, unspecified: Secondary | ICD-10-CM | POA: Diagnosis not present

## 2019-02-16 DIAGNOSIS — E1122 Type 2 diabetes mellitus with diabetic chronic kidney disease: Secondary | ICD-10-CM | POA: Diagnosis not present

## 2019-02-16 DIAGNOSIS — G4733 Obstructive sleep apnea (adult) (pediatric): Secondary | ICD-10-CM | POA: Diagnosis not present

## 2019-02-16 DIAGNOSIS — N182 Chronic kidney disease, stage 2 (mild): Secondary | ICD-10-CM | POA: Diagnosis not present

## 2019-02-16 DIAGNOSIS — E785 Hyperlipidemia, unspecified: Secondary | ICD-10-CM | POA: Diagnosis not present

## 2019-02-17 DIAGNOSIS — E1140 Type 2 diabetes mellitus with diabetic neuropathy, unspecified: Secondary | ICD-10-CM | POA: Diagnosis not present

## 2019-02-17 DIAGNOSIS — E1122 Type 2 diabetes mellitus with diabetic chronic kidney disease: Secondary | ICD-10-CM | POA: Diagnosis not present

## 2019-02-17 DIAGNOSIS — G4733 Obstructive sleep apnea (adult) (pediatric): Secondary | ICD-10-CM | POA: Diagnosis not present

## 2019-02-17 DIAGNOSIS — M19042 Primary osteoarthritis, left hand: Secondary | ICD-10-CM | POA: Diagnosis not present

## 2019-02-17 DIAGNOSIS — M19071 Primary osteoarthritis, right ankle and foot: Secondary | ICD-10-CM | POA: Diagnosis not present

## 2019-02-17 DIAGNOSIS — N182 Chronic kidney disease, stage 2 (mild): Secondary | ICD-10-CM | POA: Diagnosis not present

## 2019-02-17 DIAGNOSIS — E785 Hyperlipidemia, unspecified: Secondary | ICD-10-CM | POA: Diagnosis not present

## 2019-02-17 DIAGNOSIS — Z981 Arthrodesis status: Secondary | ICD-10-CM | POA: Diagnosis not present

## 2019-02-17 DIAGNOSIS — M19072 Primary osteoarthritis, left ankle and foot: Secondary | ICD-10-CM | POA: Diagnosis not present

## 2019-02-17 DIAGNOSIS — M47819 Spondylosis without myelopathy or radiculopathy, site unspecified: Secondary | ICD-10-CM | POA: Diagnosis not present

## 2019-02-17 DIAGNOSIS — I429 Cardiomyopathy, unspecified: Secondary | ICD-10-CM | POA: Diagnosis not present

## 2019-02-17 DIAGNOSIS — I509 Heart failure, unspecified: Secondary | ICD-10-CM | POA: Diagnosis not present

## 2019-02-17 DIAGNOSIS — M19041 Primary osteoarthritis, right hand: Secondary | ICD-10-CM | POA: Diagnosis not present

## 2019-02-17 DIAGNOSIS — I130 Hypertensive heart and chronic kidney disease with heart failure and stage 1 through stage 4 chronic kidney disease, or unspecified chronic kidney disease: Secondary | ICD-10-CM | POA: Diagnosis not present

## 2019-02-22 DIAGNOSIS — I509 Heart failure, unspecified: Secondary | ICD-10-CM | POA: Diagnosis not present

## 2019-02-22 DIAGNOSIS — E1140 Type 2 diabetes mellitus with diabetic neuropathy, unspecified: Secondary | ICD-10-CM | POA: Diagnosis not present

## 2019-02-22 DIAGNOSIS — M19071 Primary osteoarthritis, right ankle and foot: Secondary | ICD-10-CM | POA: Diagnosis not present

## 2019-02-22 DIAGNOSIS — E785 Hyperlipidemia, unspecified: Secondary | ICD-10-CM | POA: Diagnosis not present

## 2019-02-22 DIAGNOSIS — M19042 Primary osteoarthritis, left hand: Secondary | ICD-10-CM | POA: Diagnosis not present

## 2019-02-22 DIAGNOSIS — I130 Hypertensive heart and chronic kidney disease with heart failure and stage 1 through stage 4 chronic kidney disease, or unspecified chronic kidney disease: Secondary | ICD-10-CM | POA: Diagnosis not present

## 2019-02-22 DIAGNOSIS — I429 Cardiomyopathy, unspecified: Secondary | ICD-10-CM | POA: Diagnosis not present

## 2019-02-22 DIAGNOSIS — E1122 Type 2 diabetes mellitus with diabetic chronic kidney disease: Secondary | ICD-10-CM | POA: Diagnosis not present

## 2019-02-22 DIAGNOSIS — N182 Chronic kidney disease, stage 2 (mild): Secondary | ICD-10-CM | POA: Diagnosis not present

## 2019-02-22 DIAGNOSIS — G4733 Obstructive sleep apnea (adult) (pediatric): Secondary | ICD-10-CM | POA: Diagnosis not present

## 2019-02-22 DIAGNOSIS — M47819 Spondylosis without myelopathy or radiculopathy, site unspecified: Secondary | ICD-10-CM | POA: Diagnosis not present

## 2019-02-22 DIAGNOSIS — M19072 Primary osteoarthritis, left ankle and foot: Secondary | ICD-10-CM | POA: Diagnosis not present

## 2019-02-22 DIAGNOSIS — Z981 Arthrodesis status: Secondary | ICD-10-CM | POA: Diagnosis not present

## 2019-02-22 DIAGNOSIS — M19041 Primary osteoarthritis, right hand: Secondary | ICD-10-CM | POA: Diagnosis not present

## 2019-02-23 DIAGNOSIS — M47819 Spondylosis without myelopathy or radiculopathy, site unspecified: Secondary | ICD-10-CM | POA: Diagnosis not present

## 2019-02-23 DIAGNOSIS — E1140 Type 2 diabetes mellitus with diabetic neuropathy, unspecified: Secondary | ICD-10-CM | POA: Diagnosis not present

## 2019-02-23 DIAGNOSIS — I429 Cardiomyopathy, unspecified: Secondary | ICD-10-CM | POA: Diagnosis not present

## 2019-02-23 DIAGNOSIS — N182 Chronic kidney disease, stage 2 (mild): Secondary | ICD-10-CM | POA: Diagnosis not present

## 2019-02-23 DIAGNOSIS — I509 Heart failure, unspecified: Secondary | ICD-10-CM | POA: Diagnosis not present

## 2019-02-23 DIAGNOSIS — G4733 Obstructive sleep apnea (adult) (pediatric): Secondary | ICD-10-CM | POA: Diagnosis not present

## 2019-02-23 DIAGNOSIS — Z981 Arthrodesis status: Secondary | ICD-10-CM | POA: Diagnosis not present

## 2019-02-23 DIAGNOSIS — E785 Hyperlipidemia, unspecified: Secondary | ICD-10-CM | POA: Diagnosis not present

## 2019-02-23 DIAGNOSIS — M19072 Primary osteoarthritis, left ankle and foot: Secondary | ICD-10-CM | POA: Diagnosis not present

## 2019-02-23 DIAGNOSIS — M19042 Primary osteoarthritis, left hand: Secondary | ICD-10-CM | POA: Diagnosis not present

## 2019-02-23 DIAGNOSIS — M19071 Primary osteoarthritis, right ankle and foot: Secondary | ICD-10-CM | POA: Diagnosis not present

## 2019-02-23 DIAGNOSIS — E1122 Type 2 diabetes mellitus with diabetic chronic kidney disease: Secondary | ICD-10-CM | POA: Diagnosis not present

## 2019-02-23 DIAGNOSIS — M19041 Primary osteoarthritis, right hand: Secondary | ICD-10-CM | POA: Diagnosis not present

## 2019-02-23 DIAGNOSIS — I130 Hypertensive heart and chronic kidney disease with heart failure and stage 1 through stage 4 chronic kidney disease, or unspecified chronic kidney disease: Secondary | ICD-10-CM | POA: Diagnosis not present

## 2019-02-24 DIAGNOSIS — M19042 Primary osteoarthritis, left hand: Secondary | ICD-10-CM | POA: Diagnosis not present

## 2019-02-24 DIAGNOSIS — E1140 Type 2 diabetes mellitus with diabetic neuropathy, unspecified: Secondary | ICD-10-CM | POA: Diagnosis not present

## 2019-02-24 DIAGNOSIS — M19041 Primary osteoarthritis, right hand: Secondary | ICD-10-CM | POA: Diagnosis not present

## 2019-02-24 DIAGNOSIS — M19071 Primary osteoarthritis, right ankle and foot: Secondary | ICD-10-CM | POA: Diagnosis not present

## 2019-02-24 DIAGNOSIS — G4733 Obstructive sleep apnea (adult) (pediatric): Secondary | ICD-10-CM | POA: Diagnosis not present

## 2019-02-24 DIAGNOSIS — N182 Chronic kidney disease, stage 2 (mild): Secondary | ICD-10-CM | POA: Diagnosis not present

## 2019-02-24 DIAGNOSIS — I130 Hypertensive heart and chronic kidney disease with heart failure and stage 1 through stage 4 chronic kidney disease, or unspecified chronic kidney disease: Secondary | ICD-10-CM | POA: Diagnosis not present

## 2019-02-24 DIAGNOSIS — M19072 Primary osteoarthritis, left ankle and foot: Secondary | ICD-10-CM | POA: Diagnosis not present

## 2019-02-24 DIAGNOSIS — M47819 Spondylosis without myelopathy or radiculopathy, site unspecified: Secondary | ICD-10-CM | POA: Diagnosis not present

## 2019-02-24 DIAGNOSIS — I429 Cardiomyopathy, unspecified: Secondary | ICD-10-CM | POA: Diagnosis not present

## 2019-02-24 DIAGNOSIS — E785 Hyperlipidemia, unspecified: Secondary | ICD-10-CM | POA: Diagnosis not present

## 2019-02-24 DIAGNOSIS — I509 Heart failure, unspecified: Secondary | ICD-10-CM | POA: Diagnosis not present

## 2019-02-24 DIAGNOSIS — Z981 Arthrodesis status: Secondary | ICD-10-CM | POA: Diagnosis not present

## 2019-02-24 DIAGNOSIS — E1122 Type 2 diabetes mellitus with diabetic chronic kidney disease: Secondary | ICD-10-CM | POA: Diagnosis not present

## 2019-02-25 ENCOUNTER — Telehealth: Payer: Self-pay | Admitting: Orthopedic Surgery

## 2019-02-25 DIAGNOSIS — Q762 Congenital spondylolisthesis: Secondary | ICD-10-CM | POA: Diagnosis not present

## 2019-02-25 NOTE — Telephone Encounter (Signed)
LVM for VO for [name]

## 2019-02-25 NOTE — Telephone Encounter (Signed)
[name]/[name]/PT needing verbal orders  2x week  For 4 weeks  Please call Cindee Salt @ [phone]. He has a confidential VM and a message can be left.

## 2019-03-01 ENCOUNTER — Other Ambulatory Visit: Payer: Self-pay

## 2019-03-01 ENCOUNTER — Emergency Department (HOSPITAL_COMMUNITY): Payer: Medicare Other

## 2019-03-01 ENCOUNTER — Emergency Department (HOSPITAL_COMMUNITY)
Admission: EM | Admit: 2019-03-01 | Discharge: 2019-03-01 | Disposition: A | Discharge status: Home / Self Care | Payer: Medicare Other | Attending: Emergency Medicine | Admitting: Emergency Medicine

## 2019-03-01 ENCOUNTER — Encounter (HOSPITAL_COMMUNITY): Payer: Self-pay | Admitting: *Deleted

## 2019-03-01 DIAGNOSIS — I509 Heart failure, unspecified: Secondary | ICD-10-CM | POA: Diagnosis not present

## 2019-03-01 DIAGNOSIS — E1122 Type 2 diabetes mellitus with diabetic chronic kidney disease: Secondary | ICD-10-CM | POA: Diagnosis not present

## 2019-03-01 DIAGNOSIS — R404 Transient alteration of awareness: Secondary | ICD-10-CM | POA: Diagnosis not present

## 2019-03-01 DIAGNOSIS — R2681 Unsteadiness on feet: Secondary | ICD-10-CM | POA: Diagnosis not present

## 2019-03-01 DIAGNOSIS — R4182 Altered mental status, unspecified: Secondary | ICD-10-CM | POA: Diagnosis present

## 2019-03-01 DIAGNOSIS — I130 Hypertensive heart and chronic kidney disease with heart failure and stage 1 through stage 4 chronic kidney disease, or unspecified chronic kidney disease: Secondary | ICD-10-CM | POA: Diagnosis not present

## 2019-03-01 DIAGNOSIS — Z79899 Other long term (current) drug therapy: Secondary | ICD-10-CM | POA: Insufficient documentation

## 2019-03-01 DIAGNOSIS — T402X1A Poisoning by other opioids, accidental (unintentional), initial encounter: Secondary | ICD-10-CM | POA: Insufficient documentation

## 2019-03-01 DIAGNOSIS — I491 Atrial premature depolarization: Secondary | ICD-10-CM | POA: Diagnosis not present

## 2019-03-01 DIAGNOSIS — Z7982 Long term (current) use of aspirin: Secondary | ICD-10-CM | POA: Insufficient documentation

## 2019-03-01 DIAGNOSIS — R600 Localized edema: Secondary | ICD-10-CM | POA: Insufficient documentation

## 2019-03-01 DIAGNOSIS — M2550 Pain in unspecified joint: Secondary | ICD-10-CM | POA: Diagnosis not present

## 2019-03-01 DIAGNOSIS — N182 Chronic kidney disease, stage 2 (mild): Secondary | ICD-10-CM | POA: Insufficient documentation

## 2019-03-01 DIAGNOSIS — F17210 Nicotine dependence, cigarettes, uncomplicated: Secondary | ICD-10-CM | POA: Diagnosis not present

## 2019-03-01 DIAGNOSIS — T507X1A Poisoning by analeptics and opioid receptor antagonists, accidental (unintentional), initial encounter: Secondary | ICD-10-CM | POA: Diagnosis not present

## 2019-03-01 DIAGNOSIS — T50901A Poisoning by unspecified drugs, medicaments and biological substances, accidental (unintentional), initial encounter: Secondary | ICD-10-CM

## 2019-03-01 DIAGNOSIS — E1140 Type 2 diabetes mellitus with diabetic neuropathy, unspecified: Secondary | ICD-10-CM | POA: Diagnosis not present

## 2019-03-01 DIAGNOSIS — Z7401 Bed confinement status: Secondary | ICD-10-CM | POA: Diagnosis not present

## 2019-03-01 DIAGNOSIS — E1165 Type 2 diabetes mellitus with hyperglycemia: Secondary | ICD-10-CM | POA: Diagnosis not present

## 2019-03-01 DIAGNOSIS — I517 Cardiomegaly: Secondary | ICD-10-CM | POA: Diagnosis not present

## 2019-03-01 DIAGNOSIS — R0689 Other abnormalities of breathing: Secondary | ICD-10-CM | POA: Diagnosis not present

## 2019-03-01 DIAGNOSIS — T50904A Poisoning by unspecified drugs, medicaments and biological substances, undetermined, initial encounter: Secondary | ICD-10-CM | POA: Diagnosis not present

## 2019-03-01 LAB — CBC
HCT: 36.7 % (ref 36.0–46.0)
Hemoglobin: 11.1 g/dL — ABNORMAL LOW (ref 12.0–15.0)
MCH: 29.1 pg (ref 26.0–34.0)
MCHC: 30.2 g/dL (ref 30.0–36.0)
MCV: 96.1 fL (ref 80.0–100.0)
Platelets: 181 10*3/uL (ref 150–400)
RBC: 3.82 MIL/uL — ABNORMAL LOW (ref 3.87–5.11)
RDW: 13.4 % (ref 11.5–15.5)
WBC: 6.2 10*3/uL (ref 4.0–10.5)
nRBC: 0 % (ref 0.0–0.2)

## 2019-03-01 LAB — ACETAMINOPHEN LEVEL: Acetaminophen (Tylenol), Serum: <10 ug/mL — ABNORMAL LOW (ref 10–30)

## 2019-03-01 LAB — ETHANOL: Alcohol, Ethyl (B): <10 mg/dL (ref <10)

## 2019-03-01 LAB — COMPREHENSIVE METABOLIC PANEL
ALT: 20 U/L (ref 0–44)
AST: 40 U/L (ref 15–41)
Albumin: 3.1 g/dL — ABNORMAL LOW (ref 3.5–5.0)
Alkaline Phosphatase: 143 U/L — ABNORMAL HIGH (ref 38–126)
Anion gap: 6 (ref 5–15)
BUN: 32 mg/dL — ABNORMAL HIGH (ref 6–20)
CO2: 25 mmol/L (ref 22–32)
Calcium: 8.5 mg/dL — ABNORMAL LOW (ref 8.9–10.3)
Chloride: 104 mmol/L (ref 98–111)
Creatinine, Ser: 1.42 mg/dL — ABNORMAL HIGH (ref 0.44–1.00)
GFR calc Af Amer: 49 mL/min — ABNORMAL LOW (ref >60)
GFR calc non Af Amer: 43 mL/min — ABNORMAL LOW (ref >60)
Glucose, Bld: 292 mg/dL — ABNORMAL HIGH (ref 70–99)
Potassium: 4.8 mmol/L (ref 3.5–5.1)
Sodium: 135 mmol/L (ref 135–145)
Total Bilirubin: 0.6 mg/dL (ref 0.3–1.2)
Total Protein: 7.2 g/dL (ref 6.5–8.1)

## 2019-03-01 LAB — SALICYLATE LEVEL: Salicylate Lvl: <7 mg/dL (ref 2.8–30.0)

## 2019-03-01 NOTE — ED Provider Notes (Signed)
[facility] EMERGENCY DEPARTMENT Provider Note   CSN: [id number] Arrival date & time: 03/01/19  1638    History   Chief Complaint Chief Complaint  Patient presents with  . Drug Overdose    HPI [name] is a 53 y.o. female with history of diabetes mellitus type 2, CHF, GERD, OSA not on CPAP, and CKD stage II who presents to the emergency department by EMS with altered mental status.  The patient's daughter, Seth Bake, reports that she woke up around 5 AM and went to check on the patient who was sleeping in her lift chair.  She reports that the patient was covered in sweat.  Reports that she attempted to shake her, but the patient did not respond.  She reports that she called 911 and as she was calling 911 her sister administered 4 mg of intranasal narcan. She reports that after "seconds" of starting CPR while the patient was in the lift chair that she woke up, but then began yawning and fell back asleep. She reports that she applied pressure to her chest a second time, and the patient was then able to make eye contact.  Reports that the patient began to start talking around the time the EMS arrived.  EMS reports the patient's CBG was 319 in route.  Drowsy and yawning on EMS arrival. She stayed between 48-50 on nasal capnography.  She did not require supplemental oxygen.  Patient's daughter reports that she was recently taken off of insulin by her primary care provider.  The patient's daughter reports the patient has a history of OSA and does not have a CPAP machine at home.  In the ER, the patient reports she took a "couple" of Percocet earlier in the night.  She falls asleep before stating why she took this medication.  She denies suicidal ideation.  Speech is slurred, but she is oriented to self.   Patient's daughters: Seth Bake [phone] [name] [phone]  Level V caveat secondary to altered mental status.      The history is provided by the patient. No  language interpreter was used.    Past Medical History:  Diagnosis Date  . Arthritis    "hands, arms, feet, back, knees" (02/17/2018)  . Cardiomyopathy [facility])    From Dr. Bonney Roussel office notes from 01/13/18  . CHF (congestive heart failure) ([place])   . Chronic lower back pain   . Diabetic neuropathy ([place])   . Family history of adverse reaction to anesthesia    Mother is hard to arouse and blood pressure drops  . GERD (gastroesophageal reflux disease)   . High cholesterol   . Hypertension   . Sleep apnea    "need new machine"  - does not use a cpap (02/17/2018)  . Type II diabetes mellitus ([place])    no longer on medications (02/17/2018)    Patient Active Problem List   Diagnosis Date Noted  . S/P revision of total knee, right 05/14/2018  . Dislocated knee, right, sequela   . S/P revision of total knee 02/17/2018  . Failed total right knee replacement ([place])   . Chronic renal insufficiency, stage 2 (mild) 11/10/2017  . Acute CHF (congestive heart failure) ([place]) 11/10/2017  . Failed total knee arthroplasty, sequela 08/31/2017  . Lower extremity cellulitis 01/03/2013  . Diabetes mellitus ([place]) 01/03/2013  . Morbid (severe) obesity due to excess calories ([place]) 11/12/2006  . TOBACCO DEPENDENCE 11/12/2006  . DEPRESSIVE DISORDER, NOS 11/12/2006  . HYPERTENSION, BENIGN SYSTEMIC 11/12/2006  .  GASTROESOPHAGEAL REFLUX, NO ESOPHAGITIS 11/12/2006  . ACNE 11/12/2006  . OSTEOARTHRITIS, LOWER LEG 11/12/2006  . APNEA, SLEEP 11/12/2006    Past Surgical History:  Procedure Laterality Date  . ANTERIOR CERVICAL DECOMP/DISCECTOMY FUSION  03/06/2010   C4-7 w/corpectomy/notes 03/17/2010  . BACK SURGERY    . CARPAL TUNNEL RELEASE Right   . HARDWARE REVISION  04/10/2010   cervical hardware revision screw replacement C4/notes 04/12/2010  . JOINT REPLACEMENT    . LAPAROSCOPIC CHOLECYSTECTOMY  1999  . LUMBAR LAMINECTOMY/DECOMPRESSION MICRODISCECTOMY  09/2010   Archie Endo 10/05/2010  . MULTIPLE EXTRACTIONS  WITH ALVEOLOPLASTY [name] 01/19/2015   Procedure: MULTIPLE EXTRACTIONS Three, Four, Five, Seven, eight, nine, ten, eleven, twelve, fourteen, eighteen, twenty-one, twenty-eight, twenty-nine, thirty-one with Alveoloplasty.  ;  Surgeon: Diona Browner, DDS;  Location: [place];  Service: Oral Surgery;  Laterality: [name];  . REPLACEMENT TOTAL KNEE BILATERAL Bilateral 10/2007-03/2008   "left-right"  . TOENAIL EXCISION Bilateral    great toes  . TONSILLECTOMY    . TOTAL KNEE REVISION Right 02/17/2018  . TOTAL KNEE REVISION Right 02/17/2018   Procedure: RIGHT TOTAL KNEE REVISION;  Surgeon: Newt Minion, MD;  Location: [place];  Service: Orthopedics;  Laterality: Right;  . TOTAL KNEE REVISION Right 05/14/2018   Procedure: RIGHT TOTAL KNEE REVISION TO HINGED KNEE;  Surgeon: Newt Minion, MD;  Location: [place];  Service: Orthopedics;  Laterality: Right;     OB History   No obstetric history on file.      Home Medications    Prior to Admission medications   Medication Sig Start Date End Date Taking? Authorizing Provider  allopurinol (ZYLOPRIM) 100 MG tablet Take 100 mg by mouth daily. 12/06/18   [provider]  aspirin EC 325 MG EC tablet Take 1 tablet (325 mg total) by mouth daily with breakfast. 05/17/18   Newt Minion, MD  atorvastatin (LIPITOR) 40 MG tablet Take 40 mg by mouth daily.    [provider]  cephALEXin (KEFLEX) 500 MG capsule Take 1 capsule (500 mg total) by mouth 2 (two) times daily. 12/31/18   Ward, Delice Bison, DO  furosemide (LASIX) 40 MG tablet Take 1 tablet (40 mg total) by mouth daily. Patient taking differently: Take 20 mg by mouth daily.  11/14/17   Velvet Bathe, MD  lisinopril-hydrochlorothiazide (PRINZIDE,ZESTORETIC) 20-25 MG tablet Take 1 tablet by mouth daily. 01/21/18   [provider]  meloxicam (MOBIC) 15 MG tablet Take 1 tablet (15 mg total) by mouth daily. 09/27/18   Orpah Greek, MD  metoprolol succinate (TOPROL-XL) 25 MG 24 hr tablet Take 37.5 mg  by mouth daily.    [provider]  oxyCODONE-acetaminophen (PERCOCET) 10-325 MG tablet Take 1 tablet by mouth every 8 (eight) hours as needed for pain. Patient taking differently: Take 1 tablet by mouth 3 (three) times daily as needed for pain.  09/30/18   Rayburn, Neta Mends, PA-C  sacubitril-valsartan (ENTRESTO) 24-26 MG Take 1 tablet by mouth 2 (two) times daily.     [provider]  XTAMPZA ER 18 MG C12A Take 18 mg by mouth 2 (two) times daily. 12/06/18   [provider]    Family History Family History  Problem Relation Age of Onset  . Kidney disease Mother   . Congestive Heart Failure Mother   . Hypertension Father     Social History Social History   Tobacco Use  . Smoking status: Current Every Day Smoker    Packs/day: 0.50    Years:  32.00    Pack years: 16.00    Types: Cigarettes  . Smokeless tobacco: Never Used  Substance Use Topics  . Alcohol use: Not Currently  . Drug use: Not Currently    Types: Marijuana     Allergies   Patient has no known allergies.   Review of Systems Review of Systems  Unable to perform ROS: Mental status change     Physical Exam Updated Vital Signs BP (!) 149/74   Pulse 93   Temp 98.8 F (37.1 C)   Resp (!) 28   LMP 11/09/2012   SpO2 93%   Physical Exam Vitals signs and nursing note reviewed.  Constitutional:      General: She is not in acute distress.    Appearance: She is not diaphoretic.     Comments: Morbidly obese  HENT:     Head: Normocephalic.     Comments: Head is atraumatic Eyes:     Extraocular Movements: Extraocular movements intact.     Conjunctiva/sclera: Conjunctivae normal.     Pupils: Pupils are equal, round, and reactive to light.  Neck:     Musculoskeletal: Neck supple.  Cardiovascular:     Rate and Rhythm: Normal rate and regular rhythm.     Pulses: Normal pulses.     Heart sounds: Normal heart sounds. No murmur. No friction rub. No gallop.   Pulmonary:      Effort: Pulmonary effort is normal. No respiratory distress.     Comments: Upper transmitted airway sounds with auscultation of the lungs.  Breath sounds are equal and unlabored.  No increased work of breathing. Abdominal:     General: There is no distension.     Palpations: Abdomen is soft. There is no mass.     Tenderness: There is no abdominal tenderness. There is no right CVA tenderness, left CVA tenderness, guarding or rebound.     Hernia: No hernia is present.  Musculoskeletal:     Right lower leg: Edema present.     Left lower leg: Edema present.     Comments: Vertical midline scar noted to the right knee.  Skin:    General: Skin is warm.     Capillary Refill: Capillary refill takes less than 2 seconds.     Findings: No rash.  Neurological:     Mental Status: She is alert.     Comments: Oriented to self. Mumbles short phrases.  Yawning.  Somnolent and falling asleep while answering questions.  Arousable to loud voice.  Psychiatric:        Behavior: Behavior normal.      ED Treatments / Results  Labs (all labs ordered are listed, but only abnormal results are displayed) Labs Reviewed  CBC - Abnormal; Notable for the following components:      Result Value   RBC 3.82 (*)    Hemoglobin 11.1 (*)    All other components within normal limits  COMPREHENSIVE METABOLIC PANEL  ACETAMINOPHEN LEVEL  ETHANOL  RAPID URINE DRUG SCREEN, HOSP PERFORMED  SALICYLATE LEVEL    EKG None  Radiology No results found.  Procedures Procedures (including critical care time)  Medications Ordered in ED Medications - No data to display   Initial Impression / Assessment and Plan / ED Course  I have reviewed the triage vital signs and the nursing notes.  Pertinent labs & imaging results that were available during my care of the patient were reviewed by me and considered in my medical decision making (see chart for  details).  Clinical Course as of Feb 28 730  Tue Mar 01, 2019  0656  Patient recheck.  She is currently alert and oriented to person, time, and place.  Reports that she took 2 tablets of Percocet at approximately midnight due to pain in her right ankle and knee.  Denies recent falls or injuries.  She reports that she has been having problems with her right knee since February after having revision of right TKA with Dr. Sharol Given in August 2019.    [MM]    Clinical Course User Index [MM] Sofya Moustafa A, PA-C   53 year old female history of diabetes mellitus type 2, CHF, GERD, OSA not on CPAP, and CKD stage II presenting by EMS from home with altered mental status.  She was found sometime between 5 and 6 AM by her daughter and was diaphoretic and unarousable.  She was given 4 mg of intranasal Narcan and was arousable within approximately 1 minute after administration.  CPR with initiated by family but stopped within seconds as the patient became arousable.  Given the timing explained by family, patient likely became arousable after Narcan administration.  Given body habitus and well positioned in her lift chair, low likelihood of quality chest compressions.  I question if the patient was difficult to arouse given her history of sleep apnea not treated with CPAP in the setting of Percocet x2.  Patient's mental status has been steadily improving since arrival in the ER.SaO2 94-6% on RA.  She is not diaphoretic and is oriented x3.  She is protecting her airway.  Labs, chest x-ray, and EKG are pending.  As mentation continues to improve, she will likely need further examination of her right lower extremity in the setting of worsening pain.   Per [place] controlled database, the patient had 90 tablets of 10 oxycodone acetaminophen filled on May 21 and 60 tablets of Xtampza filled on 5/22.   Patient care transferred to PA Law at the end of my shift. Patient presentation, ED course, and plan of care discussed with review of all pertinent labs and imaging. Please see his/her note for  further details regarding further ED course and disposition.      Final Clinical Impressions(s) / ED Diagnoses   Final diagnoses:  None    ED Discharge Orders    None       Donivan Thammavong A, PA-C 03/01/19 0731    Ward, Delice Bison, DO 03/01/19 2332

## 2019-03-01 NOTE — Discharge Instructions (Signed)
You were seen today for an accidental drug overdose.  Please talk to your pain management doctor and your primary care provider about this.  Please arrange another sleep study for your sleep apnea, as is important that you wear a CPAP machine.  Only take your pain medication as prescribed.  Please return to the emergency department if you develop any new or worsening symptoms.

## 2019-03-01 NOTE — ED Triage Notes (Signed)
Pt from home by EMS for accidental overdose. Pt found by family, unresponsive in chair. Family started CPR and gave 4mg  intranasal narcan. Pt lethargic on arrival, able to answer questions with stimulation. Denies self harm. Seen in April for same

## 2019-03-01 NOTE — ED Provider Notes (Signed)
Signout from [employer], PA-C at shift change See previous providers note for full H&P  Briefly, patient presents following an intentional drug overdose.  Patient took 2 tablets of Percocet around midnight, which is more than she usually takes.  She has been dealing with ankle and knee pain for the the past 4 months.  She sees Dr. Sharol Given.  She has undiagnosed sleep apnea.  Intranasal Narcan was administered by family and they also began chest compressions and pt quickly aroused and was alert by the time EMS arrived.  Patient is wheelchair-bound.  Labs are pending.  If patient back to baseline, plan to discharge home with advice to only take Percocet as prescribed and recommended sleep study.  Follow-up to Dr. Sharol Given for continued pain.  Physical Exam  BP 132/73   Pulse 88   Temp 98.8 F (37.1 C)   Resp (!) 21   LMP 11/09/2012   SpO2 99%   Physical Exam Vitals signs and nursing note reviewed.  Constitutional:      General: She is not in acute distress.    Appearance: She is well-developed. She is not diaphoretic.     Comments: Morbidly obese  HENT:     Head: Normocephalic and atraumatic.  Eyes:     General: No scleral icterus.       Right eye: No discharge.        Left eye: No discharge.     Conjunctiva/sclera: Conjunctivae normal.  Cardiovascular:     Rate and Rhythm: Normal rate.  Pulmonary:     Effort: Pulmonary effort is normal. No respiratory distress.  Skin:    General: Skin is warm and dry.     Coloration: Skin is not pale.     Findings: No rash.  Neurological:     Mental Status: She is alert and oriented to person, place, and time.     Coordination: Coordination normal.     Comments: Alert and oriented x3, having full conversation, asking for coffee and something to eat  Psychiatric:        Behavior: Behavior normal.        Thought Content: Thought content normal.        Judgment: Judgment normal.     ED Course/Procedures   Clinical Course as of Feb 29 1028   Tue Mar 01, 2019  0656 Patient recheck.  She is currently alert and oriented to person, time, and place.  Reports that she took 2 tablets of Percocet at approximately midnight due to pain in her right ankle and knee.  Denies recent falls or injuries.  She reports that she has been having problems with her right knee since February after having revision of right TKA with Dr. Sharol Given in August 2019.    [MM]    Clinical Course User Index [MM] McDonald, Mia A, PA-C    Procedures  MDM  Patient presenting with accidental drug overdose when she took an extra Percocet on top of her long-acting Xtampza.  Patient sees pain management for this.  She reports she is supposed to have gotten a CPAP machine for her sleep apnea, but has not done this.  I encouraged her to do this as it is very important, especially with taking opioids.  She notes that she is been very tired and falling asleep during the day.  She is aware that she does need to take care of her sleep apnea.  Patient is back to baseline and feeling well.  She is wheelchair-bound, so  ambulation not tested prior to discharge.  She would like to go home.  I strongly advised her to only take her medication as prescribed, but to speak with her pain management doctor regarding her accidental overdose today.  Return precautions discussed.  Patient understands and agrees with plan.  Patient vital stable and discharged in satisfactory condition.      Frederica Kuster, PA-C 03/01/19 1029    Carmin Muskrat, MD 03/01/19 1556

## 2019-03-03 DIAGNOSIS — Z981 Arthrodesis status: Secondary | ICD-10-CM | POA: Diagnosis not present

## 2019-03-03 DIAGNOSIS — I429 Cardiomyopathy, unspecified: Secondary | ICD-10-CM | POA: Diagnosis not present

## 2019-03-03 DIAGNOSIS — E1122 Type 2 diabetes mellitus with diabetic chronic kidney disease: Secondary | ICD-10-CM | POA: Diagnosis not present

## 2019-03-03 DIAGNOSIS — I509 Heart failure, unspecified: Secondary | ICD-10-CM | POA: Diagnosis not present

## 2019-03-03 DIAGNOSIS — M19041 Primary osteoarthritis, right hand: Secondary | ICD-10-CM | POA: Diagnosis not present

## 2019-03-03 DIAGNOSIS — N182 Chronic kidney disease, stage 2 (mild): Secondary | ICD-10-CM | POA: Diagnosis not present

## 2019-03-03 DIAGNOSIS — M47819 Spondylosis without myelopathy or radiculopathy, site unspecified: Secondary | ICD-10-CM | POA: Diagnosis not present

## 2019-03-03 DIAGNOSIS — E1140 Type 2 diabetes mellitus with diabetic neuropathy, unspecified: Secondary | ICD-10-CM | POA: Diagnosis not present

## 2019-03-03 DIAGNOSIS — I130 Hypertensive heart and chronic kidney disease with heart failure and stage 1 through stage 4 chronic kidney disease, or unspecified chronic kidney disease: Secondary | ICD-10-CM | POA: Diagnosis not present

## 2019-03-03 DIAGNOSIS — G4733 Obstructive sleep apnea (adult) (pediatric): Secondary | ICD-10-CM | POA: Diagnosis not present

## 2019-03-03 DIAGNOSIS — M19072 Primary osteoarthritis, left ankle and foot: Secondary | ICD-10-CM | POA: Diagnosis not present

## 2019-03-03 DIAGNOSIS — E785 Hyperlipidemia, unspecified: Secondary | ICD-10-CM | POA: Diagnosis not present

## 2019-03-03 DIAGNOSIS — M19042 Primary osteoarthritis, left hand: Secondary | ICD-10-CM | POA: Diagnosis not present

## 2019-03-03 DIAGNOSIS — M19071 Primary osteoarthritis, right ankle and foot: Secondary | ICD-10-CM | POA: Diagnosis not present

## 2019-03-04 DIAGNOSIS — G4731 Primary central sleep apnea: Secondary | ICD-10-CM | POA: Diagnosis not present

## 2019-03-04 DIAGNOSIS — R0683 Snoring: Secondary | ICD-10-CM | POA: Diagnosis not present

## 2019-03-04 DIAGNOSIS — G4733 Obstructive sleep apnea (adult) (pediatric): Secondary | ICD-10-CM | POA: Diagnosis not present

## 2019-03-07 DIAGNOSIS — E119 Type 2 diabetes mellitus without complications: Secondary | ICD-10-CM | POA: Diagnosis not present

## 2019-03-08 DIAGNOSIS — M19072 Primary osteoarthritis, left ankle and foot: Secondary | ICD-10-CM | POA: Diagnosis not present

## 2019-03-08 DIAGNOSIS — M47817 Spondylosis without myelopathy or radiculopathy, lumbosacral region: Secondary | ICD-10-CM | POA: Diagnosis not present

## 2019-03-08 DIAGNOSIS — N182 Chronic kidney disease, stage 2 (mild): Secondary | ICD-10-CM | POA: Diagnosis not present

## 2019-03-08 DIAGNOSIS — E1140 Type 2 diabetes mellitus with diabetic neuropathy, unspecified: Secondary | ICD-10-CM | POA: Diagnosis not present

## 2019-03-08 DIAGNOSIS — M19042 Primary osteoarthritis, left hand: Secondary | ICD-10-CM | POA: Diagnosis not present

## 2019-03-08 DIAGNOSIS — M25512 Pain in left shoulder: Secondary | ICD-10-CM | POA: Diagnosis not present

## 2019-03-08 DIAGNOSIS — M19071 Primary osteoarthritis, right ankle and foot: Secondary | ICD-10-CM | POA: Diagnosis not present

## 2019-03-08 DIAGNOSIS — M79672 Pain in left foot: Secondary | ICD-10-CM | POA: Diagnosis not present

## 2019-03-08 DIAGNOSIS — M25572 Pain in left ankle and joints of left foot: Secondary | ICD-10-CM | POA: Diagnosis not present

## 2019-03-08 DIAGNOSIS — M19041 Primary osteoarthritis, right hand: Secondary | ICD-10-CM | POA: Diagnosis not present

## 2019-03-08 DIAGNOSIS — Z79899 Other long term (current) drug therapy: Secondary | ICD-10-CM | POA: Diagnosis not present

## 2019-03-08 DIAGNOSIS — I509 Heart failure, unspecified: Secondary | ICD-10-CM | POA: Diagnosis not present

## 2019-03-08 DIAGNOSIS — G4733 Obstructive sleep apnea (adult) (pediatric): Secondary | ICD-10-CM | POA: Diagnosis not present

## 2019-03-08 DIAGNOSIS — I429 Cardiomyopathy, unspecified: Secondary | ICD-10-CM | POA: Diagnosis not present

## 2019-03-08 DIAGNOSIS — I130 Hypertensive heart and chronic kidney disease with heart failure and stage 1 through stage 4 chronic kidney disease, or unspecified chronic kidney disease: Secondary | ICD-10-CM | POA: Diagnosis not present

## 2019-03-08 DIAGNOSIS — Z981 Arthrodesis status: Secondary | ICD-10-CM | POA: Diagnosis not present

## 2019-03-08 DIAGNOSIS — E1122 Type 2 diabetes mellitus with diabetic chronic kidney disease: Secondary | ICD-10-CM | POA: Diagnosis not present

## 2019-03-08 DIAGNOSIS — E785 Hyperlipidemia, unspecified: Secondary | ICD-10-CM | POA: Diagnosis not present

## 2019-03-08 DIAGNOSIS — M47819 Spondylosis without myelopathy or radiculopathy, site unspecified: Secondary | ICD-10-CM | POA: Diagnosis not present

## 2019-03-11 DIAGNOSIS — N182 Chronic kidney disease, stage 2 (mild): Secondary | ICD-10-CM | POA: Diagnosis not present

## 2019-03-11 DIAGNOSIS — I509 Heart failure, unspecified: Secondary | ICD-10-CM | POA: Diagnosis not present

## 2019-03-11 DIAGNOSIS — Z981 Arthrodesis status: Secondary | ICD-10-CM | POA: Diagnosis not present

## 2019-03-11 DIAGNOSIS — M19072 Primary osteoarthritis, left ankle and foot: Secondary | ICD-10-CM | POA: Diagnosis not present

## 2019-03-11 DIAGNOSIS — I429 Cardiomyopathy, unspecified: Secondary | ICD-10-CM | POA: Diagnosis not present

## 2019-03-11 DIAGNOSIS — M19071 Primary osteoarthritis, right ankle and foot: Secondary | ICD-10-CM | POA: Diagnosis not present

## 2019-03-11 DIAGNOSIS — E785 Hyperlipidemia, unspecified: Secondary | ICD-10-CM | POA: Diagnosis not present

## 2019-03-11 DIAGNOSIS — M19041 Primary osteoarthritis, right hand: Secondary | ICD-10-CM | POA: Diagnosis not present

## 2019-03-11 DIAGNOSIS — G4733 Obstructive sleep apnea (adult) (pediatric): Secondary | ICD-10-CM | POA: Diagnosis not present

## 2019-03-11 DIAGNOSIS — I130 Hypertensive heart and chronic kidney disease with heart failure and stage 1 through stage 4 chronic kidney disease, or unspecified chronic kidney disease: Secondary | ICD-10-CM | POA: Diagnosis not present

## 2019-03-11 DIAGNOSIS — M47819 Spondylosis without myelopathy or radiculopathy, site unspecified: Secondary | ICD-10-CM | POA: Diagnosis not present

## 2019-03-11 DIAGNOSIS — M19042 Primary osteoarthritis, left hand: Secondary | ICD-10-CM | POA: Diagnosis not present

## 2019-03-11 DIAGNOSIS — E1140 Type 2 diabetes mellitus with diabetic neuropathy, unspecified: Secondary | ICD-10-CM | POA: Diagnosis not present

## 2019-03-11 DIAGNOSIS — E1122 Type 2 diabetes mellitus with diabetic chronic kidney disease: Secondary | ICD-10-CM | POA: Diagnosis not present

## 2019-03-17 DIAGNOSIS — G4733 Obstructive sleep apnea (adult) (pediatric): Secondary | ICD-10-CM | POA: Diagnosis not present

## 2019-03-17 DIAGNOSIS — E785 Hyperlipidemia, unspecified: Secondary | ICD-10-CM | POA: Diagnosis not present

## 2019-03-17 DIAGNOSIS — E1122 Type 2 diabetes mellitus with diabetic chronic kidney disease: Secondary | ICD-10-CM | POA: Diagnosis not present

## 2019-03-17 DIAGNOSIS — N182 Chronic kidney disease, stage 2 (mild): Secondary | ICD-10-CM | POA: Diagnosis not present

## 2019-03-17 DIAGNOSIS — I130 Hypertensive heart and chronic kidney disease with heart failure and stage 1 through stage 4 chronic kidney disease, or unspecified chronic kidney disease: Secondary | ICD-10-CM | POA: Diagnosis not present

## 2019-03-17 DIAGNOSIS — E1140 Type 2 diabetes mellitus with diabetic neuropathy, unspecified: Secondary | ICD-10-CM | POA: Diagnosis not present

## 2019-03-17 DIAGNOSIS — M19042 Primary osteoarthritis, left hand: Secondary | ICD-10-CM | POA: Diagnosis not present

## 2019-03-17 DIAGNOSIS — I509 Heart failure, unspecified: Secondary | ICD-10-CM | POA: Diagnosis not present

## 2019-03-17 DIAGNOSIS — M47819 Spondylosis without myelopathy or radiculopathy, site unspecified: Secondary | ICD-10-CM | POA: Diagnosis not present

## 2019-03-17 DIAGNOSIS — M19072 Primary osteoarthritis, left ankle and foot: Secondary | ICD-10-CM | POA: Diagnosis not present

## 2019-03-17 DIAGNOSIS — Z981 Arthrodesis status: Secondary | ICD-10-CM | POA: Diagnosis not present

## 2019-03-17 DIAGNOSIS — M19071 Primary osteoarthritis, right ankle and foot: Secondary | ICD-10-CM | POA: Diagnosis not present

## 2019-03-17 DIAGNOSIS — M19041 Primary osteoarthritis, right hand: Secondary | ICD-10-CM | POA: Diagnosis not present

## 2019-03-17 DIAGNOSIS — I429 Cardiomyopathy, unspecified: Secondary | ICD-10-CM | POA: Diagnosis not present

## 2019-03-18 DIAGNOSIS — M19072 Primary osteoarthritis, left ankle and foot: Secondary | ICD-10-CM | POA: Diagnosis not present

## 2019-03-18 DIAGNOSIS — Z981 Arthrodesis status: Secondary | ICD-10-CM | POA: Diagnosis not present

## 2019-03-18 DIAGNOSIS — E1140 Type 2 diabetes mellitus with diabetic neuropathy, unspecified: Secondary | ICD-10-CM | POA: Diagnosis not present

## 2019-03-18 DIAGNOSIS — M19042 Primary osteoarthritis, left hand: Secondary | ICD-10-CM | POA: Diagnosis not present

## 2019-03-18 DIAGNOSIS — M19041 Primary osteoarthritis, right hand: Secondary | ICD-10-CM | POA: Diagnosis not present

## 2019-03-18 DIAGNOSIS — N182 Chronic kidney disease, stage 2 (mild): Secondary | ICD-10-CM | POA: Diagnosis not present

## 2019-03-18 DIAGNOSIS — E785 Hyperlipidemia, unspecified: Secondary | ICD-10-CM | POA: Diagnosis not present

## 2019-03-18 DIAGNOSIS — I509 Heart failure, unspecified: Secondary | ICD-10-CM | POA: Diagnosis not present

## 2019-03-18 DIAGNOSIS — M47819 Spondylosis without myelopathy or radiculopathy, site unspecified: Secondary | ICD-10-CM | POA: Diagnosis not present

## 2019-03-18 DIAGNOSIS — M19071 Primary osteoarthritis, right ankle and foot: Secondary | ICD-10-CM | POA: Diagnosis not present

## 2019-03-18 DIAGNOSIS — E1122 Type 2 diabetes mellitus with diabetic chronic kidney disease: Secondary | ICD-10-CM | POA: Diagnosis not present

## 2019-03-18 DIAGNOSIS — G4733 Obstructive sleep apnea (adult) (pediatric): Secondary | ICD-10-CM | POA: Diagnosis not present

## 2019-03-18 DIAGNOSIS — I429 Cardiomyopathy, unspecified: Secondary | ICD-10-CM | POA: Diagnosis not present

## 2019-03-18 DIAGNOSIS — I130 Hypertensive heart and chronic kidney disease with heart failure and stage 1 through stage 4 chronic kidney disease, or unspecified chronic kidney disease: Secondary | ICD-10-CM | POA: Diagnosis not present

## 2019-03-22 DIAGNOSIS — Z79899 Other long term (current) drug therapy: Secondary | ICD-10-CM | POA: Diagnosis not present

## 2019-03-22 DIAGNOSIS — M25572 Pain in left ankle and joints of left foot: Secondary | ICD-10-CM | POA: Diagnosis not present

## 2019-03-22 DIAGNOSIS — M25512 Pain in left shoulder: Secondary | ICD-10-CM | POA: Diagnosis not present

## 2019-03-22 DIAGNOSIS — M79672 Pain in left foot: Secondary | ICD-10-CM | POA: Diagnosis not present

## 2019-03-22 DIAGNOSIS — M47817 Spondylosis without myelopathy or radiculopathy, lumbosacral region: Secondary | ICD-10-CM | POA: Diagnosis not present

## 2019-03-24 ENCOUNTER — Other Ambulatory Visit (HOSPITAL_BASED_OUTPATIENT_CLINIC_OR_DEPARTMENT_OTHER): Payer: Self-pay

## 2019-03-24 DIAGNOSIS — M19041 Primary osteoarthritis, right hand: Secondary | ICD-10-CM | POA: Diagnosis not present

## 2019-03-24 DIAGNOSIS — N182 Chronic kidney disease, stage 2 (mild): Secondary | ICD-10-CM | POA: Diagnosis not present

## 2019-03-24 DIAGNOSIS — M19071 Primary osteoarthritis, right ankle and foot: Secondary | ICD-10-CM | POA: Diagnosis not present

## 2019-03-24 DIAGNOSIS — Z981 Arthrodesis status: Secondary | ICD-10-CM | POA: Diagnosis not present

## 2019-03-24 DIAGNOSIS — R5383 Other fatigue: Secondary | ICD-10-CM

## 2019-03-24 DIAGNOSIS — G4733 Obstructive sleep apnea (adult) (pediatric): Secondary | ICD-10-CM | POA: Diagnosis not present

## 2019-03-24 DIAGNOSIS — G4730 Sleep apnea, unspecified: Secondary | ICD-10-CM

## 2019-03-24 DIAGNOSIS — E1140 Type 2 diabetes mellitus with diabetic neuropathy, unspecified: Secondary | ICD-10-CM | POA: Diagnosis not present

## 2019-03-24 DIAGNOSIS — E1122 Type 2 diabetes mellitus with diabetic chronic kidney disease: Secondary | ICD-10-CM | POA: Diagnosis not present

## 2019-03-24 DIAGNOSIS — E785 Hyperlipidemia, unspecified: Secondary | ICD-10-CM | POA: Diagnosis not present

## 2019-03-24 DIAGNOSIS — I429 Cardiomyopathy, unspecified: Secondary | ICD-10-CM | POA: Diagnosis not present

## 2019-03-24 DIAGNOSIS — I130 Hypertensive heart and chronic kidney disease with heart failure and stage 1 through stage 4 chronic kidney disease, or unspecified chronic kidney disease: Secondary | ICD-10-CM | POA: Diagnosis not present

## 2019-03-24 DIAGNOSIS — M47819 Spondylosis without myelopathy or radiculopathy, site unspecified: Secondary | ICD-10-CM | POA: Diagnosis not present

## 2019-03-24 DIAGNOSIS — I509 Heart failure, unspecified: Secondary | ICD-10-CM | POA: Diagnosis not present

## 2019-03-24 DIAGNOSIS — M19042 Primary osteoarthritis, left hand: Secondary | ICD-10-CM | POA: Diagnosis not present

## 2019-03-24 DIAGNOSIS — M19072 Primary osteoarthritis, left ankle and foot: Secondary | ICD-10-CM | POA: Diagnosis not present

## 2019-03-24 DIAGNOSIS — R0683 Snoring: Secondary | ICD-10-CM

## 2019-03-24 DIAGNOSIS — G4710 Hypersomnia, unspecified: Secondary | ICD-10-CM

## 2019-03-25 ENCOUNTER — Telehealth: Payer: Self-pay | Admitting: Orthopedic Surgery

## 2019-03-25 DIAGNOSIS — S83104A Unspecified dislocation of right knee, initial encounter: Secondary | ICD-10-CM | POA: Diagnosis not present

## 2019-03-25 DIAGNOSIS — Q762 Congenital spondylolisthesis: Secondary | ICD-10-CM | POA: Diagnosis not present

## 2019-03-25 DIAGNOSIS — Z96651 Presence of right artificial knee joint: Secondary | ICD-10-CM | POA: Diagnosis not present

## 2019-03-25 NOTE — Telephone Encounter (Signed)
Randall Hiss was called and given verbal okay for orders for patient, lvm.

## 2019-03-25 NOTE — Telephone Encounter (Signed)
Received voicemail message from Eula Fried) with [facility] needing verbal orders to re-certify patient once more. (Same frequency) (twice a week) Patient is up to 65 degrees in right knee and is now able to transfer to the 3 in 1 commode independently. Patient is still making progress. Randall Hiss advised this maybe the last extinction that will be requested on her now.  The number to contact Randall Hiss is [phone]

## 2019-03-29 DIAGNOSIS — I509 Heart failure, unspecified: Secondary | ICD-10-CM | POA: Diagnosis not present

## 2019-03-29 DIAGNOSIS — I130 Hypertensive heart and chronic kidney disease with heart failure and stage 1 through stage 4 chronic kidney disease, or unspecified chronic kidney disease: Secondary | ICD-10-CM | POA: Diagnosis not present

## 2019-03-29 DIAGNOSIS — E1122 Type 2 diabetes mellitus with diabetic chronic kidney disease: Secondary | ICD-10-CM | POA: Diagnosis not present

## 2019-03-29 DIAGNOSIS — G4733 Obstructive sleep apnea (adult) (pediatric): Secondary | ICD-10-CM | POA: Diagnosis not present

## 2019-03-29 DIAGNOSIS — E1140 Type 2 diabetes mellitus with diabetic neuropathy, unspecified: Secondary | ICD-10-CM | POA: Diagnosis not present

## 2019-03-29 DIAGNOSIS — I429 Cardiomyopathy, unspecified: Secondary | ICD-10-CM | POA: Diagnosis not present

## 2019-03-29 DIAGNOSIS — M19072 Primary osteoarthritis, left ankle and foot: Secondary | ICD-10-CM | POA: Diagnosis not present

## 2019-03-29 DIAGNOSIS — E785 Hyperlipidemia, unspecified: Secondary | ICD-10-CM | POA: Diagnosis not present

## 2019-03-29 DIAGNOSIS — M47819 Spondylosis without myelopathy or radiculopathy, site unspecified: Secondary | ICD-10-CM | POA: Diagnosis not present

## 2019-03-29 DIAGNOSIS — N182 Chronic kidney disease, stage 2 (mild): Secondary | ICD-10-CM | POA: Diagnosis not present

## 2019-03-29 DIAGNOSIS — M19041 Primary osteoarthritis, right hand: Secondary | ICD-10-CM | POA: Diagnosis not present

## 2019-03-29 DIAGNOSIS — M19042 Primary osteoarthritis, left hand: Secondary | ICD-10-CM | POA: Diagnosis not present

## 2019-03-29 DIAGNOSIS — M19071 Primary osteoarthritis, right ankle and foot: Secondary | ICD-10-CM | POA: Diagnosis not present

## 2019-03-31 ENCOUNTER — Other Ambulatory Visit: Payer: Self-pay

## 2019-03-31 DIAGNOSIS — N182 Chronic kidney disease, stage 2 (mild): Secondary | ICD-10-CM | POA: Diagnosis not present

## 2019-03-31 DIAGNOSIS — I509 Heart failure, unspecified: Secondary | ICD-10-CM | POA: Diagnosis not present

## 2019-03-31 DIAGNOSIS — M19071 Primary osteoarthritis, right ankle and foot: Secondary | ICD-10-CM | POA: Diagnosis not present

## 2019-03-31 DIAGNOSIS — M19042 Primary osteoarthritis, left hand: Secondary | ICD-10-CM | POA: Diagnosis not present

## 2019-03-31 DIAGNOSIS — E785 Hyperlipidemia, unspecified: Secondary | ICD-10-CM | POA: Diagnosis not present

## 2019-03-31 DIAGNOSIS — E1140 Type 2 diabetes mellitus with diabetic neuropathy, unspecified: Secondary | ICD-10-CM | POA: Diagnosis not present

## 2019-03-31 DIAGNOSIS — G4733 Obstructive sleep apnea (adult) (pediatric): Secondary | ICD-10-CM | POA: Diagnosis not present

## 2019-03-31 DIAGNOSIS — I429 Cardiomyopathy, unspecified: Secondary | ICD-10-CM | POA: Diagnosis not present

## 2019-03-31 DIAGNOSIS — M47819 Spondylosis without myelopathy or radiculopathy, site unspecified: Secondary | ICD-10-CM | POA: Diagnosis not present

## 2019-03-31 DIAGNOSIS — I130 Hypertensive heart and chronic kidney disease with heart failure and stage 1 through stage 4 chronic kidney disease, or unspecified chronic kidney disease: Secondary | ICD-10-CM | POA: Diagnosis not present

## 2019-03-31 DIAGNOSIS — M19041 Primary osteoarthritis, right hand: Secondary | ICD-10-CM | POA: Diagnosis not present

## 2019-03-31 DIAGNOSIS — M19072 Primary osteoarthritis, left ankle and foot: Secondary | ICD-10-CM | POA: Diagnosis not present

## 2019-03-31 DIAGNOSIS — E1122 Type 2 diabetes mellitus with diabetic chronic kidney disease: Secondary | ICD-10-CM | POA: Diagnosis not present

## 2019-03-31 NOTE — Patient Outreach (Signed)
[facility]) Care Management  03/31/2019  [name] [birth year] [id number]   Referral received from [facility] team for complex case management.  Successful outreach with [name]. HIPAA identifiers verified. She reports feeling well today. Reports receiving therapy services with [facility].   She reports having all needed medications and taking them as prescribed. Denies concerns regarding medication management or affordability.   Denies urgent care concerns or need for immediate assistance in the home. She is agreeable to participation in the complex case management program. Agreeable to follow-up outreach and completion of telephonic assessment on next week.  PLAN -Will follow-up on next week.  [place] [phone]

## 2019-04-06 DIAGNOSIS — M79672 Pain in left foot: Secondary | ICD-10-CM | POA: Diagnosis not present

## 2019-04-06 DIAGNOSIS — Z79899 Other long term (current) drug therapy: Secondary | ICD-10-CM | POA: Diagnosis not present

## 2019-04-06 DIAGNOSIS — Z1159 Encounter for screening for other viral diseases: Secondary | ICD-10-CM | POA: Diagnosis not present

## 2019-04-06 DIAGNOSIS — L309 Dermatitis, unspecified: Secondary | ICD-10-CM | POA: Diagnosis not present

## 2019-04-06 DIAGNOSIS — M25572 Pain in left ankle and joints of left foot: Secondary | ICD-10-CM | POA: Diagnosis not present

## 2019-04-06 DIAGNOSIS — G894 Chronic pain syndrome: Secondary | ICD-10-CM | POA: Diagnosis not present

## 2019-04-06 DIAGNOSIS — M47817 Spondylosis without myelopathy or radiculopathy, lumbosacral region: Secondary | ICD-10-CM | POA: Diagnosis not present

## 2019-04-07 DIAGNOSIS — E1122 Type 2 diabetes mellitus with diabetic chronic kidney disease: Secondary | ICD-10-CM | POA: Diagnosis not present

## 2019-04-07 DIAGNOSIS — G4733 Obstructive sleep apnea (adult) (pediatric): Secondary | ICD-10-CM | POA: Diagnosis not present

## 2019-04-07 DIAGNOSIS — M47819 Spondylosis without myelopathy or radiculopathy, site unspecified: Secondary | ICD-10-CM | POA: Diagnosis not present

## 2019-04-07 DIAGNOSIS — I509 Heart failure, unspecified: Secondary | ICD-10-CM | POA: Diagnosis not present

## 2019-04-07 DIAGNOSIS — I429 Cardiomyopathy, unspecified: Secondary | ICD-10-CM | POA: Diagnosis not present

## 2019-04-07 DIAGNOSIS — M19072 Primary osteoarthritis, left ankle and foot: Secondary | ICD-10-CM | POA: Diagnosis not present

## 2019-04-07 DIAGNOSIS — N182 Chronic kidney disease, stage 2 (mild): Secondary | ICD-10-CM | POA: Diagnosis not present

## 2019-04-07 DIAGNOSIS — I130 Hypertensive heart and chronic kidney disease with heart failure and stage 1 through stage 4 chronic kidney disease, or unspecified chronic kidney disease: Secondary | ICD-10-CM | POA: Diagnosis not present

## 2019-04-07 DIAGNOSIS — M19071 Primary osteoarthritis, right ankle and foot: Secondary | ICD-10-CM | POA: Diagnosis not present

## 2019-04-07 DIAGNOSIS — E1140 Type 2 diabetes mellitus with diabetic neuropathy, unspecified: Secondary | ICD-10-CM | POA: Diagnosis not present

## 2019-04-07 DIAGNOSIS — E785 Hyperlipidemia, unspecified: Secondary | ICD-10-CM | POA: Diagnosis not present

## 2019-04-07 DIAGNOSIS — M19041 Primary osteoarthritis, right hand: Secondary | ICD-10-CM | POA: Diagnosis not present

## 2019-04-07 DIAGNOSIS — M19042 Primary osteoarthritis, left hand: Secondary | ICD-10-CM | POA: Diagnosis not present

## 2019-04-08 ENCOUNTER — Other Ambulatory Visit: Payer: Self-pay

## 2019-04-08 ENCOUNTER — Telehealth: Payer: Self-pay | Admitting: Pharmacist

## 2019-04-08 DIAGNOSIS — E785 Hyperlipidemia, unspecified: Secondary | ICD-10-CM | POA: Diagnosis not present

## 2019-04-08 DIAGNOSIS — M19071 Primary osteoarthritis, right ankle and foot: Secondary | ICD-10-CM | POA: Diagnosis not present

## 2019-04-08 DIAGNOSIS — M19041 Primary osteoarthritis, right hand: Secondary | ICD-10-CM | POA: Diagnosis not present

## 2019-04-08 DIAGNOSIS — E1122 Type 2 diabetes mellitus with diabetic chronic kidney disease: Secondary | ICD-10-CM | POA: Diagnosis not present

## 2019-04-08 DIAGNOSIS — M19072 Primary osteoarthritis, left ankle and foot: Secondary | ICD-10-CM | POA: Diagnosis not present

## 2019-04-08 DIAGNOSIS — E1110 Type 2 diabetes mellitus with ketoacidosis without coma: Secondary | ICD-10-CM

## 2019-04-08 DIAGNOSIS — M47819 Spondylosis without myelopathy or radiculopathy, site unspecified: Secondary | ICD-10-CM | POA: Diagnosis not present

## 2019-04-08 DIAGNOSIS — K219 Gastro-esophageal reflux disease without esophagitis: Secondary | ICD-10-CM

## 2019-04-08 DIAGNOSIS — I429 Cardiomyopathy, unspecified: Secondary | ICD-10-CM | POA: Diagnosis not present

## 2019-04-08 DIAGNOSIS — N182 Chronic kidney disease, stage 2 (mild): Secondary | ICD-10-CM | POA: Diagnosis not present

## 2019-04-08 DIAGNOSIS — I509 Heart failure, unspecified: Secondary | ICD-10-CM

## 2019-04-08 DIAGNOSIS — E1140 Type 2 diabetes mellitus with diabetic neuropathy, unspecified: Secondary | ICD-10-CM | POA: Diagnosis not present

## 2019-04-08 DIAGNOSIS — G4733 Obstructive sleep apnea (adult) (pediatric): Secondary | ICD-10-CM | POA: Diagnosis not present

## 2019-04-08 DIAGNOSIS — M19042 Primary osteoarthritis, left hand: Secondary | ICD-10-CM | POA: Diagnosis not present

## 2019-04-08 DIAGNOSIS — I130 Hypertensive heart and chronic kidney disease with heart failure and stage 1 through stage 4 chronic kidney disease, or unspecified chronic kidney disease: Secondary | ICD-10-CM | POA: Diagnosis not present

## 2019-04-08 DIAGNOSIS — I10 Essential (primary) hypertension: Secondary | ICD-10-CM

## 2019-04-08 NOTE — Telephone Encounter (Signed)
-----   Message from Jiles Harold sent at 04/08/2019  2:11 PM EDT ----- Regarding: Referral: Order for Glyn Ade  Referral from [name], RN  "Please see below request"  Forde Radon ----- Message ----- From: [name], RN Sent: 04/08/2019  12:50 PM EDT To: Thn Cm Communication Orders Subject: Order for [name]                      Patient Name: [name], [name]([id number]) Sex: Female DOB: 18-Aug-1966    PCP: Vonna Drafts   Center: [facility]   Types of orders made on 04/08/2019: Nursing  Order Date:04/08/2019 Ordering User:MCCRAY, Manuella Ghazi [[id number]] Encounter Provider:McCray, Babs Sciara, RN [[id number]] Auth orizing Provider: Vonna Drafts, FNP [[id number]] Department:[facility][[id number]]  Order Specific Information Order: Comm to Pharmacy [Custom: [id number]]  Order #: [id number] Qty: 1   Priority: Routine  Class: Clinic Performed   Comment:Please assign [facility] Pharmacist for polypharmacy. Patient declines need            for medication assistance.   Associated Diagnoses     I10 HYPERTENSION, BENIGN  SYSTEMIC     I50.9 Acute congestive heart failure, unspecified heart failure type (HCC)     E66.01 Morbid (severe) obesity due to excess calories (HCC)     K21.9 Gastroesophageal reflux disease, esophagitis presence not specified     E11.10 DM (diabetes mellitus) type 2, uncontrolled, with ketoacidosis ([place])     Reason for Consult -> Polypharmacy       Priority: Routine  Class: Clinic Performed    Scheduling Instruction [name]:   Please assign [facility] Pharmacist for polypharmacy. Patient declines need for    medication assistance.   Comment:Please assign [facility] Pharmacist for polypharmacy. Patient declines need            for medication assistance.   Associated Diagnoses     I10 HYPERTENSION, BENIGN SYSTEMIC     I50.9 Acute congestive heart failure, unspecified heart failure type (HCC)     E66.01 Morbid (severe) obesity  due to excess calories (HCC)     K21. 9 Gastroesophageal reflux disease, esophagitis presence not specified     E11.10 DM (diabetes mellitus) type 2, uncontrolled, with ketoacidosis ([place])     Reason for Consult -> Polypharmacy

## 2019-04-08 NOTE — Patient Outreach (Signed)
[place] [facility]) Care Management  [place]  04/08/2019   WYATT GALVAN 1966/09/08 [id number]  Subjective:  Outreach for completion of telephonic assessment.  Objective:  Encounter Medications:  Outpatient Encounter Medications as of 04/08/2019  Medication Sig Note  . allopurinol (ZYLOPRIM) 100 MG tablet Take 100 mg by mouth daily.   Marland Kitchen aspirin EC 325 MG EC tablet Take 1 tablet (325 mg total) by mouth daily with breakfast.   . atorvastatin (LIPITOR) 40 MG tablet Take 40 mg by mouth daily.   . cephALEXin (KEFLEX) 500 MG capsule Take 1 capsule (500 mg total) by mouth 2 (two) times daily.   . furosemide (LASIX) 40 MG tablet Take 1 tablet (40 mg total) by mouth daily. (Patient taking differently: Take 20 mg by mouth daily. )   . lisinopril-hydrochlorothiazide (PRINZIDE,ZESTORETIC) 20-25 MG tablet Take 1 tablet by mouth daily.   . meloxicam (MOBIC) 15 MG tablet Take 1 tablet (15 mg total) by mouth daily. (Patient not taking: Reported on 04/08/2019)   . metoprolol succinate (TOPROL-XL) 25 MG 24 hr tablet Take 37.5 mg by mouth daily.   Marland Kitchen oxyCODONE-acetaminophen (PERCOCET) 10-325 MG tablet Take 1 tablet by mouth every 8 (eight) hours as needed for pain. (Patient taking differently: Take 1 tablet by mouth 3 (three) times daily as needed for pain. ) 03/01/2019: LF 5.21.20 QTY 90 30DS  . sacubitril-valsartan (ENTRESTO) 24-26 MG Take 1 tablet by mouth 2 (two) times daily.    Marland Kitchen XTAMPZA ER 18 MG C12A Take 18 mg by mouth 2 (two) times daily. 03/01/2019: LF 5.22.20 QTY 60 30DS   No facility-administered encounter medications on file as of 04/08/2019.     Functional Status:  In your present state of health, do you have any difficulty performing the following activities: 04/08/2019 05/14/2018  Hearing? N -  Vision? N -  Difficulty concentrating or making decisions? N -  Walking or climbing stairs? Y -  Dressing or bathing? N -  Doing errands, shopping? Y Y  Comment Due to limited  mobility -  Conservation officer, nature and eating ? N -  Using the Toilet? N -  In the past six months, have you accidently leaked urine? Y -  Comment Due to diuretic. -  Do you have problems with loss of bowel control? N -  Managing your Medications? N -  Managing your Finances? N -  Housekeeping or managing your Housekeeping? Y -  Comment Due to limited mobility. -  Some recent data might be hidden    Fall/Depression Screening: Fall Risk  04/08/2019 09/30/2018  Falls in the past year? 1 1  Number falls in past yr: 0 0  Injury with Fall? 0 0  Risk for fall due to : Impaired balance/gait;Impaired mobility;Medication side effect;History of fall(s) -  Risk for fall due to: Comment Reports fall occurred in medical facility. -  Follow up Falls prevention discussed -   PHQ 2/9 Scores 04/08/2019 09/30/2018  PHQ - 2 Score 1 0    Assessment:  Telephonic assessment complete. Ms. Hostler denies worsening symptoms but is being monitored closely for elevated BP. Reports recent medication adjustments due to a systolic range of 449'Q to 180's. Her diastolic readings have been within range. She reports frequent outreach with her primary care NP. Reports pending outreach with Cardiology.   She reports taking medications as prescribed. Noted discrepancies with medication doses during the medication review. Also reports receiving prescriptions that were not listed in her current medication profile. She  is agreeable to outreach with a [place] to ensure accuracy. She denies concerns regarding prescription costs.   Discussed care management needs. She reports limited mobility and relies heavily on her electric scooter. Her primary concern was housing. She expressed several concerns regarding the condition of the rental home and requested housing assistance. She also requested assistance with receiving respite services. 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 She is agreeable to outreach with  a [place] SW. She denies concerns regarding nutrition. Declines need for transportation assistance.   Thoroughly discussed worsening s/sx along with indications for seeking immediate medical attention. Ms. Mabin verbalized understanding. Agreed to call if assistance is needed prior to our next scheduled outreach. TH[facility] CM Care Plan Problem One     Most Recent Value  Care Plan Problem One  High Risk for Admission  Role Documenting the Problem One  Care Management [place] for Problem One  Active  THN Long Term Goal   Over the next 90 days, patient will not be hospiltalized d/t chronic disease complications.  [facility]N Long Term Goal Start Date  04/08/19  Interventions for Problem One Long Term Goal  Discussed medications, treatment recommendations and ability to function in the home.   [facility] CM Short Term Goal #1   Over the next 30 days, patient will obtain scale from [facility]. Patient will monitor and record daily weights.  [facility]N CM Short Term Goal #1 Start Date  04/08/19  Interventions for Short Term Goal #1  Provided education regarding CHF weight parameters. Patient encouraged to monitor and record daily weights.  [facility]N CM Short Term Goal #2   Over the next 30 days, patient will monitor and record daily blood sugar levels.  [facility]N CM Short Term Goal #2 Start Date  04/08/19  Interventions for Short Term Goal #2  Reviewed s/sx of hypoglycemia and hyperglycemia. Reviewed optimal blood sugar levels and indications for notifying provider.  THN CM Short Term Goal #3  Over the next 30 days, patient will increase compliance with diabetic diet.  THN CM Short Term Goal #3 Start Date  04/08/19  Interventions for Short Tern Goal #3  Discussed current intake and nutritional needs. Reviewed diabetic diet recommendations. [Declined need for nutritional assistance.]  THN CM Short Term Goal #4  Over the next 30 days, patient will monitor BP daily and record readings.  THN CM Short Term Goal #4 Start Date  04/08/19   Interventions for Short Term Goal #4  Reviewed BP ranges and indication for seeking immediate medical attention.   THN CM Short Term Goal #5   Over the next 30 days, patient will complete medication reconcillation with [facility] Pharmacist and take medications as prescribed.  [facility]N CM Short Term Goal #5 Start Date  04/08/19  Interventions for Short Term Goal #5  Reviewed current medications and indications for use. Patient reports taking additional medications not listed on her current profile.  [Referred to [facility] Pharmacist for polypharmacy.]    [facility] CM Care Plan Problem Two     Most Recent Value  Care Plan Problem Two  High Risk for Falls.  Role Documenting the Problem Two  Care Management Coordinator  Care Plan for Problem Two  Active  Interventions for Problem Two Long Term Goal   Provided education regarding safety and fall prevention. Discussed concerns regarding safety in the home.Margie Billet Long Term Goal  Patient will not fall over the next 60 days.  [place] Term Goal Start  Date  04/08/19  THN CM Short Term Goal #1   Over the next 30 days, patient will use safety precautions when transferring in and out of her electric scooter.  THN CM Short Term Goal #1 Start Date  04/08/19  Interventions for Short Term Goal #2   Provided education regarding fall prevention measures.  THN CM Short Term Goal #2   Over the next 30 days, patient will practice safety precautions in the home.  [facility]N CM Short Term Goal #2 Start Date  04/08/19  Interventions for Short Term Goal #2  Discussed home safety concerns. Provided education regarding safety measures to prevent injuries. [Referral placed to [facility] SW.]      PLAN -Will submit referral for [facility] SW. -Will submit referral for [facility] Pharmacist. -Will follow-up within two weeks.   [place] [phone]

## 2019-04-09 NOTE — Patient Outreach (Signed)
[facility]) Care Management  04/09/2019  [name] January 05, 1966 [id number]  Patient was called regarding medication management and review. She answered the phone and provided HIPAA however, said she did not have time to talk and would call me back.  Patient did not call back.  Plan: Send an unsuccessful contact letter. Call patient back in 5-7 business days.   Elayne Guerin, PharmD, [place] Clinical Pharmacist [phone]

## 2019-04-11 ENCOUNTER — Other Ambulatory Visit: Payer: Self-pay

## 2019-04-11 NOTE — Patient Outreach (Signed)
[name]) Care Management  04/11/2019  [name] 29-Oct-1965 [id number]   Social work referral received from [employer].  "Please assign [facility] SW. Patient has several concerns regarding housing and requesting assistance. Also requesting assistance with obtaining respite care. 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  Patient is currently on the wait list for Section 8 housing but is unsure of status.  Unfortunately, BSW does not have access to this system so patient was encouraged to contact [employer] to inquire. Patient reports multiple issues over the period of time that she has rented her current home such as Psychologist, occupational going out, refrigerator not working, Paramedic, Social research officer, government.  Per patient, the landlord does not respond to reported issues in a timely manner.  Patient stated that she recently learned from staff at [facility] that she can get the [place] involved to assist with correction of reported issues.  She does not wish to renew her lease in February so she is unsure about getting the city involved.  BSW agreed to send her housing resources but did inform her that it is very likely these options will have wait lists so there may not be availability when her lease is up.  BSW encouraged patient to contact [facility] regarding respite resources and other potential services for her daughter.  She already had contact information for them. BSW will follow up within the next two weeks to ensure receipt of resources; [email or web address] listing and Affordable Housing Management listing.     Ronn Melena, BSW Social Worker [phone]

## 2019-04-12 ENCOUNTER — Ambulatory Visit (INDEPENDENT_AMBULATORY_CARE_PROVIDER_SITE_OTHER): Payer: Medicare Other | Admitting: Cardiology

## 2019-04-12 DIAGNOSIS — I5022 Chronic systolic (congestive) heart failure: Secondary | ICD-10-CM

## 2019-04-12 NOTE — Progress Notes (Signed)
No show

## 2019-04-15 DIAGNOSIS — G4733 Obstructive sleep apnea (adult) (pediatric): Secondary | ICD-10-CM | POA: Diagnosis not present

## 2019-04-15 DIAGNOSIS — M19071 Primary osteoarthritis, right ankle and foot: Secondary | ICD-10-CM | POA: Diagnosis not present

## 2019-04-15 DIAGNOSIS — I429 Cardiomyopathy, unspecified: Secondary | ICD-10-CM | POA: Diagnosis not present

## 2019-04-15 DIAGNOSIS — E785 Hyperlipidemia, unspecified: Secondary | ICD-10-CM | POA: Diagnosis not present

## 2019-04-15 DIAGNOSIS — E1122 Type 2 diabetes mellitus with diabetic chronic kidney disease: Secondary | ICD-10-CM | POA: Diagnosis not present

## 2019-04-15 DIAGNOSIS — M19041 Primary osteoarthritis, right hand: Secondary | ICD-10-CM | POA: Diagnosis not present

## 2019-04-15 DIAGNOSIS — I509 Heart failure, unspecified: Secondary | ICD-10-CM | POA: Diagnosis not present

## 2019-04-15 DIAGNOSIS — M19072 Primary osteoarthritis, left ankle and foot: Secondary | ICD-10-CM | POA: Diagnosis not present

## 2019-04-15 DIAGNOSIS — M47819 Spondylosis without myelopathy or radiculopathy, site unspecified: Secondary | ICD-10-CM | POA: Diagnosis not present

## 2019-04-15 DIAGNOSIS — E1140 Type 2 diabetes mellitus with diabetic neuropathy, unspecified: Secondary | ICD-10-CM | POA: Diagnosis not present

## 2019-04-15 DIAGNOSIS — I130 Hypertensive heart and chronic kidney disease with heart failure and stage 1 through stage 4 chronic kidney disease, or unspecified chronic kidney disease: Secondary | ICD-10-CM | POA: Diagnosis not present

## 2019-04-15 DIAGNOSIS — N182 Chronic kidney disease, stage 2 (mild): Secondary | ICD-10-CM | POA: Diagnosis not present

## 2019-04-15 DIAGNOSIS — M19042 Primary osteoarthritis, left hand: Secondary | ICD-10-CM | POA: Diagnosis not present

## 2019-04-19 DIAGNOSIS — I509 Heart failure, unspecified: Secondary | ICD-10-CM | POA: Diagnosis not present

## 2019-04-19 DIAGNOSIS — E1140 Type 2 diabetes mellitus with diabetic neuropathy, unspecified: Secondary | ICD-10-CM | POA: Diagnosis not present

## 2019-04-19 DIAGNOSIS — M19071 Primary osteoarthritis, right ankle and foot: Secondary | ICD-10-CM | POA: Diagnosis not present

## 2019-04-19 DIAGNOSIS — I130 Hypertensive heart and chronic kidney disease with heart failure and stage 1 through stage 4 chronic kidney disease, or unspecified chronic kidney disease: Secondary | ICD-10-CM | POA: Diagnosis not present

## 2019-04-19 DIAGNOSIS — M19042 Primary osteoarthritis, left hand: Secondary | ICD-10-CM | POA: Diagnosis not present

## 2019-04-19 DIAGNOSIS — I429 Cardiomyopathy, unspecified: Secondary | ICD-10-CM | POA: Diagnosis not present

## 2019-04-19 DIAGNOSIS — E1122 Type 2 diabetes mellitus with diabetic chronic kidney disease: Secondary | ICD-10-CM | POA: Diagnosis not present

## 2019-04-19 DIAGNOSIS — M47819 Spondylosis without myelopathy or radiculopathy, site unspecified: Secondary | ICD-10-CM | POA: Diagnosis not present

## 2019-04-19 DIAGNOSIS — E785 Hyperlipidemia, unspecified: Secondary | ICD-10-CM | POA: Diagnosis not present

## 2019-04-19 DIAGNOSIS — G4733 Obstructive sleep apnea (adult) (pediatric): Secondary | ICD-10-CM | POA: Diagnosis not present

## 2019-04-19 DIAGNOSIS — M19072 Primary osteoarthritis, left ankle and foot: Secondary | ICD-10-CM | POA: Diagnosis not present

## 2019-04-19 DIAGNOSIS — N182 Chronic kidney disease, stage 2 (mild): Secondary | ICD-10-CM | POA: Diagnosis not present

## 2019-04-19 DIAGNOSIS — M19041 Primary osteoarthritis, right hand: Secondary | ICD-10-CM | POA: Diagnosis not present

## 2019-04-20 ENCOUNTER — Other Ambulatory Visit: Payer: Self-pay | Admitting: Pharmacist

## 2019-04-20 ENCOUNTER — Telehealth (INDEPENDENT_AMBULATORY_CARE_PROVIDER_SITE_OTHER): Payer: Medicare Other

## 2019-04-20 DIAGNOSIS — I10 Essential (primary) hypertension: Secondary | ICD-10-CM

## 2019-04-20 DIAGNOSIS — T84012S Broken internal right knee prosthesis, sequela: Secondary | ICD-10-CM

## 2019-04-20 NOTE — Patient Outreach (Signed)
[place] [facility]) Care Management  [place]   04/20/2019  [name] 1966/07/23 [id number]  Reason for referral: Medication Review  Referral source: [facility] RN Current insurance: [facility]  PMHx includes but not limited to:   OSA, Depression, type 2 diabetes, total right knee replacement, hypertension, GERD, tobacco abuse, osteoarthritis, and morbid obesity.  Outreach:  Successful telephone call with patient.  HIPAA identifiers verified.   Subjective:  Patient is a 53 year old female with the above mentioned medical conditions. She reported feeling well today and did not have any issues with her medications. She manages her medications on her own and sometimes uses a pill box.  Does the patient ever forget to take medication?  yes Does the patient have problems obtaining medications due to transportation?   Yes (Uses [facility]) Does the patient have problems obtaining medications due to cost?  no  Does the patient feel that medications prescribed are effective?  yes Does the patient ever experience any side effects to the medications prescribed?  no  Does the patient measure his/her own blood glucose at home?  No  (needs a new blodo glucose monitor) Does the patient measure his/her own blood pressure at home? Yes   Objective: The ASCVD Risk score Mikey Bussing DC Jr., et al., 2013) failed to calculate for the following reasons:   Cannot find a previous HDL lab   Cannot find a previous total cholesterol lab  Lab Results  Component Value Date   CREATININE 1.42 (H) 03/01/2019   CREATININE 1.42 (H) 12/31/2018   CREATININE 0.91 05/14/2018    Lab Results  Component Value Date   HGBA1C 7.1 (H) 05/14/2018    Lipid Panel     Component Value Date/Time   CHOL 101 03/05/2011 0545   TRIG 60 03/05/2011 0545   HDL 29 (L) 03/05/2011 0545   CHOLHDL 3.5 03/05/2011 0545   VLDL 12 03/05/2011 0545   LDLCALC 60 03/05/2011 0545    BP Readings from Last 3  Encounters:  03/01/19 132/85  12/31/18 (!) 147/81  09/27/18 134/70    No Known Allergies  Medications Reviewed Today    Reviewed by Elayne Guerin, [place] (Pharmacist) on 04/20/19 at 54  Med List Status: <None>  Medication Order Taking? Sig Documenting Provider Last Dose Status Informant        Discontinued 04/20/19 1233 (Dose change)            Med Note Nash Mantis, TIFFANI S   Tue Mar 01, 2019 10:27 AM)    allopurinol (ZYLOPRIM) 300 MG tablet [id number] Yes Take 1 tablet by mouth daily. [provider] Taking Active   ALPRAZolam Duanne Moron) 0.25 MG tablet [id number] Yes Take 1 tablet by mouth 2 (two) times daily as needed. [provider] Taking Active   aspirin EC 325 MG EC tablet [id number] No Take 1 tablet (325 mg total) by mouth daily with breakfast.  Patient not taking: Reported on 04/20/2019   Newt Minion, MD Not Taking Active         Discontinued 04/20/19 1234 (Change in therapy)   buprenorphine (BUTRANS) 10 MCG/HR PTWK patch [id number] Yes Apply one patch every 72 hours [provider] Taking Active        Patient not taking:      Discontinued 04/20/19 1234 (Completed Course)   furosemide (LASIX) 40 MG tablet [id number] Yes Take 1 tablet (40 mg total) by mouth daily.  Patient taking differently: Take 20 mg by mouth daily.  Velvet Bathe, MD Taking Active Self  lisinopril-hydrochlorothiazide [facility]) 20-25 MG tablet [id number] Yes Take 1 tablet by mouth daily. [provider] Taking Active Self       Patient not taking:      Discontinued 04/20/19 1235 (Completed Course)   metFORMIN (GLUCOPHAGE) 850 MG tablet [id number] Yes Take 1 tablet by mouth 2 (two) times daily. [provider] Taking Active   metoprolol succinate (TOPROL-XL) 25 MG 24 hr tablet [id number] Yes Take 37.5 mg by mouth daily. [provider] Taking Active Self  NARCAN 4 MG/0.1ML LIQD nasal spray kit [id number] Yes USE AS DIRECTED AS NEEDED [provider] Taking Active   oxyCODONE-acetaminophen (PERCOCET) 10-325 MG tablet [id number]  Take 1 tablet by mouth every 8 (eight) hours as needed for pain.  Patient taking differently: Take 1 tablet by mouth 3 (three) times daily as needed for pain.    [place], Neta Mends, PA-C  Active            Med Note Nash Mantis, TIFFANI S   Tue Mar 01, 2019 10:29 AM) LF 5.21.20 QTY 90 30DS        Discontinued 01/60/10 9323 (Duplicate)   rosuvastatin (CRESTOR) 20 MG tablet [id number] Yes Take 1 tablet by mouth daily. [provider] Taking Active   sacubitril-valsartan (ENTRESTO) 24-26 MG [id number] Yes Take 1 tablet by mouth 2 (two) times daily.  [provider] Taking Active Self  triamcinolone ointment (KENALOG) 0.5 % [id number] Yes APPLY 1 APPLICATION TOPICALLY twice daily [provider] Taking Active   Vitamin D, Ergocalciferol, (DRISDOL) 1.25 MG (50000 UT) CAPS capsule [id number] Yes Take 1 capsule by mouth once a week. [provider] Taking Active         Discontinued 04/20/19 1236 (Change in therapy)            Med Note (BERGER, TIFFANI S   Tue Mar 01, 2019 10:29 AM) LF 5.22.20 QTY 60 30DS  XTAMPZA ER 27 MG C12A [id number] Yes Take 1 capsule by mouth 2 (two) times daily. [provider] Taking Active           Assessment: Drugs sorted by system:  Neurologic/Psychologic: Alprazolam,   Hematologic:   Cardiovascular:  Aspirin, Furosemide, Lisinopril/HCTZ, Metoprolol, Rosuvastatin, Entresto,   Endocrine: Metformin,   Renal: Allopurinol  Topical: Triamcinolone Ointment  Pain: Buprenorphine patches, Oxycodone/APAP, [place] ER  Vitamins/Minerals/Supplements: Ergocalciferol   Miscellaneous: Narcan Nasal  Medication Review Findings:   Drug-drug interaction-Entreso & Lisinopril/HCTZ--Coadministration of Angiotensin Receptor Blockers and ACE Inhibitors may be associated with an increased risk of renal dysfunction and hyperkalemia.    (Called the [facility] Cardiology to follow up)  Patient reported that her glucometer was not working. Her PCP office was called to request a new meter be sent to the patient's pharmacy with diagnosis code and number of times the patient should be testing.    Medication Adherence Findings: Adherence Review  [] Excellent (no doses missed/week)     [x] Good (no more than 1 dose missed/week)     [] Partial (2-3 doses missed/week) [] Poor (>3 doses missed/week)  Patient with good understanding of regimen and good understanding of indications.    Medication Assistance Findings:  No medication assistance needs identified  Extra Help:  Already receiving Full Extra Help Low Income Subsidy  Patient reported having Medicare and Medicaid   Plan: . Will route note to PCP.  Marland Kitchen Will follow-up in 5-7 business days.

## 2019-04-20 NOTE — Telephone Encounter (Signed)
Telephone encounter:  Reason for call: Catina-clinical pharm called stating that she was doing a med review and noticed pt was on Entresto and Lisinopril. Wants to know if this is accurate. Please advise.//ah   Usual provider: MP  Last office visit: 7/28  Next office visit: 8/19   Last hospitalization:    Current Outpatient Medications on File Prior to Visit  Medication Sig Dispense Refill  . allopurinol (ZYLOPRIM) 300 MG tablet Take 1 tablet by mouth daily.    Marland Kitchen ALPRAZolam (XANAX) 0.25 MG tablet Take 1 tablet by mouth 2 (two) times daily as needed.    Marland Kitchen aspirin EC 325 MG EC tablet Take 1 tablet (325 mg total) by mouth daily with breakfast. (Patient not taking: Reported on 04/20/2019) 30 tablet 0  . buprenorphine (BUTRANS) 10 MCG/HR PTWK patch Apply one patch every 72 hours    . furosemide (LASIX) 40 MG tablet Take 1 tablet (40 mg total) by mouth daily. (Patient taking differently: Take 20 mg by mouth daily. ) 30 tablet 0  . lisinopril-hydrochlorothiazide (PRINZIDE,ZESTORETIC) 20-25 MG tablet Take 1 tablet by mouth daily.  3  . metFORMIN (GLUCOPHAGE) 850 MG tablet Take 1 tablet by mouth 2 (two) times daily.    . metoprolol succinate (TOPROL-XL) 25 MG 24 hr tablet Take 37.5 mg by mouth daily.    Marland Kitchen NARCAN 4 MG/0.1ML LIQD nasal spray kit USE AS DIRECTED AS NEEDED    . oxyCODONE-acetaminophen (PERCOCET) 10-325 MG tablet Take 1 tablet by mouth every 8 (eight) hours as needed for pain. (Patient taking differently: Take 1 tablet by mouth 3 (three) times daily as needed for pain. ) 28 tablet 0  . rosuvastatin (CRESTOR) 20 MG tablet Take 1 tablet by mouth daily.    . sacubitril-valsartan (ENTRESTO) 24-26 MG Take 1 tablet by mouth 2 (two) times daily.     Marland Kitchen triamcinolone ointment (KENALOG) 0.5 % APPLY 1 APPLICATION TOPICALLY twice daily    . Vitamin D, Ergocalciferol, (DRISDOL) 1.25 MG (50000 UT) CAPS capsule Take 1 capsule by mouth once a week.    Marland Kitchen XTAMPZA ER 27 MG C12A Take 1 capsule by mouth 2  (two) times daily.     No current facility-administered medications on file prior to visit.

## 2019-04-21 DIAGNOSIS — G4733 Obstructive sleep apnea (adult) (pediatric): Secondary | ICD-10-CM | POA: Diagnosis not present

## 2019-04-21 DIAGNOSIS — I429 Cardiomyopathy, unspecified: Secondary | ICD-10-CM | POA: Diagnosis not present

## 2019-04-21 DIAGNOSIS — M19041 Primary osteoarthritis, right hand: Secondary | ICD-10-CM | POA: Diagnosis not present

## 2019-04-21 DIAGNOSIS — M47819 Spondylosis without myelopathy or radiculopathy, site unspecified: Secondary | ICD-10-CM | POA: Diagnosis not present

## 2019-04-21 DIAGNOSIS — M19042 Primary osteoarthritis, left hand: Secondary | ICD-10-CM | POA: Diagnosis not present

## 2019-04-21 DIAGNOSIS — M19071 Primary osteoarthritis, right ankle and foot: Secondary | ICD-10-CM | POA: Diagnosis not present

## 2019-04-21 DIAGNOSIS — I130 Hypertensive heart and chronic kidney disease with heart failure and stage 1 through stage 4 chronic kidney disease, or unspecified chronic kidney disease: Secondary | ICD-10-CM | POA: Diagnosis not present

## 2019-04-21 DIAGNOSIS — E785 Hyperlipidemia, unspecified: Secondary | ICD-10-CM | POA: Diagnosis not present

## 2019-04-21 DIAGNOSIS — N182 Chronic kidney disease, stage 2 (mild): Secondary | ICD-10-CM | POA: Diagnosis not present

## 2019-04-21 DIAGNOSIS — E1122 Type 2 diabetes mellitus with diabetic chronic kidney disease: Secondary | ICD-10-CM | POA: Diagnosis not present

## 2019-04-21 DIAGNOSIS — E1140 Type 2 diabetes mellitus with diabetic neuropathy, unspecified: Secondary | ICD-10-CM | POA: Diagnosis not present

## 2019-04-21 DIAGNOSIS — I509 Heart failure, unspecified: Secondary | ICD-10-CM | POA: Diagnosis not present

## 2019-04-21 DIAGNOSIS — M19072 Primary osteoarthritis, left ankle and foot: Secondary | ICD-10-CM | POA: Diagnosis not present

## 2019-04-21 NOTE — Telephone Encounter (Signed)
Pt returned call, made aware of change.//ah

## 2019-04-21 NOTE — Telephone Encounter (Signed)
I have not seen the patient in over a year. She recently missed her appt. Please call her and ask her to stop lisinopril, if she is truly taking it. Will discuss more at the upcoming visit.   Thanks MJP

## 2019-04-21 NOTE — Telephone Encounter (Signed)
LMOM for pt to return call and Catina advising.//ah

## 2019-04-22 ENCOUNTER — Other Ambulatory Visit: Payer: Self-pay

## 2019-04-22 NOTE — Patient Outreach (Signed)
[facility]) Care Management  04/22/2019    [name] 07/10/1966 [id number]  Follow-up outreach with [name] regarding community resources. She confirmed outreach with the assigned [name] regarding housing. She denies immediate needs but does express concerns regarding nutrition. She is agreeable to a referral for temporary meals via the [facility] meal program. She is aware that the program is temporary and will inform the [facility] team if long term assistance is needed.  Current Outpatient Medications on File Prior to Visit  Medication Sig Dispense Refill  . allopurinol (ZYLOPRIM) 300 MG tablet Take 1 tablet by mouth daily.    Marland Kitchen ALPRAZolam (XANAX) 0.25 MG tablet Take 1 tablet by mouth 2 (two) times daily as needed.    Marland Kitchen aspirin EC 325 MG EC tablet Take 1 tablet (325 mg total) by mouth daily with breakfast. (Patient not taking: Reported on 04/20/2019) 30 tablet 0  . buprenorphine (BUTRANS) 10 MCG/HR PTWK patch Apply one patch every 72 hours    . furosemide (LASIX) 40 MG tablet Take 1 tablet (40 mg total) by mouth daily. (Patient taking differently: Take 20 mg by mouth daily. ) 30 tablet 0  . metFORMIN (GLUCOPHAGE) 850 MG tablet Take 1 tablet by mouth 2 (two) times daily.    . metoprolol succinate (TOPROL-XL) 25 MG 24 hr tablet Take 37.5 mg by mouth daily.    Marland Kitchen NARCAN 4 MG/0.1ML LIQD nasal spray kit USE AS DIRECTED AS NEEDED    . oxyCODONE-acetaminophen (PERCOCET) 10-325 MG tablet Take 1 tablet by mouth every 8 (eight) hours as needed for pain. (Patient taking differently: Take 1 tablet by mouth 3 (three) times daily as needed for pain. ) 28 tablet 0  . rosuvastatin (CRESTOR) 20 MG tablet Take 1 tablet by mouth daily.    . sacubitril-valsartan (ENTRESTO) 24-26 MG Take 1 tablet by mouth 2 (two) times daily.     Marland Kitchen triamcinolone ointment (KENALOG) 0.5 % APPLY 1 APPLICATION TOPICALLY twice daily    . Vitamin D, Ergocalciferol, (DRISDOL) 1.25 MG (50000 UT) CAPS capsule Take 1 capsule by  mouth once a week.    Marland Kitchen XTAMPZA ER 27 MG C12A Take 1 capsule by mouth 2 (two) times daily.     No current facility-administered medications on file prior to visit.       PLAN -Will contact THN [name] regarding meal delivery. -Will continue routine outreach with [name].   [place] [phone]

## 2019-04-25 ENCOUNTER — Other Ambulatory Visit: Payer: Self-pay

## 2019-04-25 NOTE — Patient Outreach (Signed)
[facility]) Care Management  04/25/2019  [name] Jun 20, 1966 [id number]   Request received from [facility], [name], to link patient to [place] program.   Follow up call to patient today to endure receipt of housing resources mailed on 04/11/19.  Patient did receive resources. BSW informed patient that she will receive first meal delivery on 04/27/19.   BSW closing case but encouraged her to call if additional needs arise.  Ronn Melena, BSW Social Worker [phone]

## 2019-04-26 ENCOUNTER — Telehealth (INDEPENDENT_AMBULATORY_CARE_PROVIDER_SITE_OTHER): Payer: Medicare Other | Admitting: Cardiology

## 2019-04-26 ENCOUNTER — Other Ambulatory Visit: Payer: Self-pay

## 2019-04-26 ENCOUNTER — Encounter: Payer: Self-pay | Admitting: Cardiology

## 2019-04-26 DIAGNOSIS — I10 Essential (primary) hypertension: Secondary | ICD-10-CM | POA: Diagnosis not present

## 2019-04-26 DIAGNOSIS — I5022 Chronic systolic (congestive) heart failure: Secondary | ICD-10-CM

## 2019-04-26 NOTE — Progress Notes (Addendum)
Follow up visit  Subjective:   [name], female    DOB: June 29, 1966, 53 y.o.   MRN: [id number]   I connected with the patient on 04/26/2019 by a video enabled telemedicine application and verified that I am speaking with the correct person using two identifiers.     I discussed the limitations of evaluation and management by telemedicine and the availability of in person appointments. The patient expressed understanding and agreed to proceed.   This visit type was conducted due to national recommendations for restrictions regarding the COVID-19 Pandemic (e.g. social distancing).  This format is felt to be most appropriate for this patient at this time.  All issues noted in this document were discussed and addressed.  No physical exam was performed (except for noted visual exam findings with Tele health visits).  The patient has consented to conduct a Tele health visit and understands insurance will be billed.   Chief complaint: Cardiomyopathy   HPI  53 year old African-American female with hypertension, type II diabetes mellitus, tobacco abuse, morbid obesity BMI 67, heart failure with reduced ejection fraction (likely nonischemic cardiomyopathy), neagtive nuclear stress test 01/2018.  Patient has not been seen in our office in over 1 year.  It appears that after she underwent a knee surgery in June 2019, she was put back on lisinopril.  This was picked up recently by pharmacy who alerted [place] about concurrent use of lisinopril and Entresto.  Lisinopril has since then been discontinued.  Patient does not have her medications or medication list with her today.  She states that she is doing well without any symptoms of shortness of breath or leg edema.    Past Medical History:  Diagnosis Date  . Arthritis    "hands, arms, feet, back, knees" (02/17/2018)  . Cardiomyopathy [facility])    From Dr. Bonney Roussel office notes from 01/13/18  . CHF (congestive heart failure) ([place])   . Chronic lower  back pain   . Diabetic neuropathy ([place])   . Family history of adverse reaction to anesthesia    Mother is hard to arouse and blood pressure drops  . GERD (gastroesophageal reflux disease)   . High cholesterol   . Hypertension   . Sleep apnea    "need new machine"  - does not use a cpap (02/17/2018)  . Type II diabetes mellitus ([place])    no longer on medications (02/17/2018)     Past Surgical History:  Procedure Laterality Date  . ANTERIOR CERVICAL DECOMP/DISCECTOMY FUSION  03/06/2010   C4-7 w/corpectomy/notes 03/17/2010  . BACK SURGERY    . CARPAL TUNNEL RELEASE Right   . HARDWARE REVISION  04/10/2010   cervical hardware revision screw replacement C4/notes 04/12/2010  . JOINT REPLACEMENT    . LAPAROSCOPIC CHOLECYSTECTOMY  1999  . LUMBAR LAMINECTOMY/DECOMPRESSION MICRODISCECTOMY  09/2010   Archie Endo 10/05/2010  . MULTIPLE EXTRACTIONS WITH ALVEOLOPLASTY [name] 01/19/2015   Procedure: MULTIPLE EXTRACTIONS Three, Four, Five, Seven, eight, nine, ten, eleven, twelve, fourteen, eighteen, twenty-one, twenty-eight, twenty-nine, thirty-one with Alveoloplasty.  ;  Surgeon: Diona Browner, DDS;  Location: [place];  Service: Oral Surgery;  Laterality: [name];  . REPLACEMENT TOTAL KNEE BILATERAL Bilateral 10/2007-03/2008   "left-right"  . TOENAIL EXCISION Bilateral    great toes  . TONSILLECTOMY    . TOTAL KNEE REVISION Right 02/17/2018  . TOTAL KNEE REVISION Right 02/17/2018   Procedure: RIGHT TOTAL KNEE REVISION;  Surgeon: Newt Minion, MD;  Location: [place];  Service: Orthopedics;  Laterality: Right;  .  TOTAL KNEE REVISION Right 05/14/2018   Procedure: RIGHT TOTAL KNEE REVISION TO HINGED KNEE;  Surgeon: Newt Minion, MD;  Location: [place];  Service: Orthopedics;  Laterality: Right;     Social History   Socioeconomic History  . Marital status: Widowed    Spouse name: Not on file  . Number of children: Not on file  . Years of education: Not on file  . Highest education level: Not on file  Occupational  History  . Not on file  Social Needs  . Financial resource strain: Not on file  . Food insecurity    Worry: Not on file    Inability: Not on file  . Transportation needs    Medical: Not on file    Non-medical: Not on file  Tobacco Use  . Smoking status: Current Every Day Smoker    Packs/day: 0.50    Years: 32.00    Pack years: 16.00    Types: Cigarettes  . Smokeless tobacco: Never Used  Substance and Sexual Activity  . Alcohol use: Not Currently  . Drug use: Not Currently    Types: Marijuana  . Sexual activity: Not Currently  Lifestyle  . Physical activity    Days per week: Not on file    Minutes per session: Not on file  . Stress: Not on file  Relationships  . Social Herbalist on phone: Not on file    Gets together: Not on file    Attends religious service: Not on file    Active member of club or organization: Not on file    Attends meetings of clubs or organizations: Not on file    Relationship status: Not on file  . Intimate partner violence    Fear of current or ex partner: Not on file    Emotionally abused: Not on file    Physically abused: Not on file    Forced sexual activity: Not on file  Other Topics Concern  . Not on file  Social History Narrative  . Not on file     Family History  Problem Relation Age of Onset  . Kidney disease Mother   . Congestive Heart Failure Mother   . Hypertension Father      Current Outpatient Medications on File Prior to Visit  Medication Sig Dispense Refill  . allopurinol (ZYLOPRIM) 300 MG tablet Take 1 tablet by mouth daily.    Marland Kitchen ALPRAZolam (XANAX) 0.25 MG tablet Take 1 tablet by mouth 2 (two) times daily as needed.    Marland Kitchen aspirin EC 325 MG EC tablet Take 1 tablet (325 mg total) by mouth daily with breakfast. (Patient not taking: Reported on 04/20/2019) 30 tablet 0  . buprenorphine (BUTRANS) 10 MCG/HR PTWK patch Apply one patch every 72 hours    . furosemide (LASIX) 40 MG tablet Take 1 tablet (40 mg total) by  mouth daily. (Patient taking differently: Take 20 mg by mouth daily. ) 30 tablet 0  . metFORMIN (GLUCOPHAGE) 850 MG tablet Take 1 tablet by mouth 2 (two) times daily.    . metoprolol succinate (TOPROL-XL) 25 MG 24 hr tablet Take 37.5 mg by mouth daily.    Marland Kitchen NARCAN 4 MG/0.1ML LIQD nasal spray kit USE AS DIRECTED AS NEEDED    . oxyCODONE-acetaminophen (PERCOCET) 10-325 MG tablet Take 1 tablet by mouth every 8 (eight) hours as needed for pain. (Patient taking differently: Take 1 tablet by mouth 3 (three) times daily as needed for pain. ) 28  tablet 0  . rosuvastatin (CRESTOR) 20 MG tablet Take 1 tablet by mouth daily.    . sacubitril-valsartan (ENTRESTO) 24-26 MG Take 1 tablet by mouth 2 (two) times daily.     Marland Kitchen triamcinolone ointment (KENALOG) 0.5 % APPLY 1 APPLICATION TOPICALLY twice daily    . Vitamin D, Ergocalciferol, (DRISDOL) 1.25 MG (50000 UT) CAPS capsule Take 1 capsule by mouth once a week.    Marland Kitchen XTAMPZA ER 27 MG C12A Take 1 capsule by mouth 2 (two) times daily.     No current facility-administered medications on file prior to visit.     Cardiovascular studies:  Lexiscan nucler stress test 01/08/2018 [facility]):" 1. No reversible ischemia or infarction.  2. Global hypokinesis with severe left ventricular dilatation.  3. Left ventricular ejection fraction 31%  4. High-risk stress test findings*.  Echocardiogram 11/06/2017: Mild concentric LVH. LVEF 33%. Suggest MRI of contrast echocardiogram due to poor LV visualization. Grade 1 diastolic dysfunction Mild RV dilatation No significant valvular abnormality. RVSP 25 mmHg Dilated IVC with poor respiratory variation.  Recent labs: 01/29/2018: Glucose 180. BUN/Cr 28/1.27. eGFR 57. Na/K 139/5.4 H/H 14/41. MCV 90. Platelets 155   Review of Systems  Constitution: Negative for decreased appetite, malaise/fatigue, weight gain and weight loss.  HENT: Negative for congestion.   Eyes: Negative for visual disturbance.  Cardiovascular:  Negative for chest pain, dyspnea on exertion, leg swelling, palpitations and syncope.  Respiratory: Negative for cough.   Endocrine: Negative for cold intolerance.  Hematologic/Lymphatic: Does not bruise/bleed easily.  Skin: Negative for itching and rash.  Musculoskeletal: Negative for myalgias.  Gastrointestinal: Negative for abdominal pain, nausea and vomiting.  Genitourinary: Negative for dysuria.  Neurological: Negative for dizziness and weakness.  Psychiatric/Behavioral: The patient is not nervous/anxious.   All other systems reviewed and are negative.       No vitals available.  Objective:   Physical Exam  Constitutional: She is oriented to person, place, and time. She appears well-developed and well-nourished. No distress.  HENT:  Head: Normocephalic and atraumatic.  Eyes: Pupils are equal, round, and reactive to light. Conjunctivae are normal.  Neck: No JVD present.  Cardiovascular: Normal rate, regular rhythm and intact distal pulses.  Pulmonary/Chest: Effort normal and breath sounds normal. She has no wheezes. She has no rales.  Abdominal: Soft. Bowel sounds are normal. There is no rebound.  Musculoskeletal:        General: No edema.  Lymphadenopathy:    She has no cervical adenopathy.  Neurological: She is alert and oriented to person, place, and time. No cranial nerve deficit.  Skin: Skin is warm and dry.  Psychiatric: She has a normal mood and affect.  Nursing note and vitals reviewed.         Assessment & Recommendations:   53 year old African-American female with hypertension, type II diabetes mellitus, tobacco abuse, morbid obesity BMI 67, heart failure with reduced ejection fraction (likely nonischemic cardiomyopathy), neagtive nuclear stress test 01/2018.  HFrEF: By history, appears not to be symptomatic at this time.  There is significant discrepancy regarding her medications.  Patient does not have her medications with her today.  To avoid any further  confusion, I recommend an office visit the patient and bring all her medications that can be accurately verified by [place].  I have recommended to stop lisinopril and continue Entresto.   Nigel Mormon, MD [facility]. [place] Pager: [phone] Office: [phone] If no answer Cell [phone]

## 2019-04-27 DIAGNOSIS — I130 Hypertensive heart and chronic kidney disease with heart failure and stage 1 through stage 4 chronic kidney disease, or unspecified chronic kidney disease: Secondary | ICD-10-CM | POA: Diagnosis not present

## 2019-04-27 DIAGNOSIS — Q762 Congenital spondylolisthesis: Secondary | ICD-10-CM | POA: Diagnosis not present

## 2019-04-27 DIAGNOSIS — M47819 Spondylosis without myelopathy or radiculopathy, site unspecified: Secondary | ICD-10-CM | POA: Diagnosis not present

## 2019-04-27 DIAGNOSIS — M19071 Primary osteoarthritis, right ankle and foot: Secondary | ICD-10-CM | POA: Diagnosis not present

## 2019-04-27 DIAGNOSIS — E785 Hyperlipidemia, unspecified: Secondary | ICD-10-CM | POA: Diagnosis not present

## 2019-04-27 DIAGNOSIS — E1140 Type 2 diabetes mellitus with diabetic neuropathy, unspecified: Secondary | ICD-10-CM | POA: Diagnosis not present

## 2019-04-27 DIAGNOSIS — M19041 Primary osteoarthritis, right hand: Secondary | ICD-10-CM | POA: Diagnosis not present

## 2019-04-27 DIAGNOSIS — I429 Cardiomyopathy, unspecified: Secondary | ICD-10-CM | POA: Diagnosis not present

## 2019-04-27 DIAGNOSIS — M19042 Primary osteoarthritis, left hand: Secondary | ICD-10-CM | POA: Diagnosis not present

## 2019-04-27 DIAGNOSIS — Z96651 Presence of right artificial knee joint: Secondary | ICD-10-CM | POA: Diagnosis not present

## 2019-04-27 DIAGNOSIS — G4733 Obstructive sleep apnea (adult) (pediatric): Secondary | ICD-10-CM | POA: Diagnosis not present

## 2019-04-27 DIAGNOSIS — N182 Chronic kidney disease, stage 2 (mild): Secondary | ICD-10-CM | POA: Diagnosis not present

## 2019-04-27 DIAGNOSIS — E1122 Type 2 diabetes mellitus with diabetic chronic kidney disease: Secondary | ICD-10-CM | POA: Diagnosis not present

## 2019-04-27 DIAGNOSIS — M19072 Primary osteoarthritis, left ankle and foot: Secondary | ICD-10-CM | POA: Diagnosis not present

## 2019-04-27 DIAGNOSIS — I509 Heart failure, unspecified: Secondary | ICD-10-CM | POA: Diagnosis not present

## 2019-04-28 ENCOUNTER — Other Ambulatory Visit: Payer: Self-pay | Admitting: Pharmacist

## 2019-04-28 NOTE — Patient Outreach (Signed)
[facility]) Care Management  04/28/2019  [name] 1966-04-21 [id number]   Patient was called to follow up on lisinopril. HIPAA identifiers were obtained. Patient confirmed she stopped Lisinopril as it is contraindicated when taken with Entresto.   Patient reported feeling ok  Medications Reviewed Today    Reviewed by Elayne Guerin, [facility] (Pharmacist) on 04/28/19 at 1313  Med List Status: <None>  Medication Order Taking? Sig Documenting Provider Last Dose Status Informant  allopurinol (ZYLOPRIM) 300 MG tablet [id number] Yes Take 1 tablet by mouth daily. [provider] Taking Active   ALPRAZolam Duanne Moron) 0.25 MG tablet [id number] Yes Take 1 tablet by mouth 2 (two) times daily as needed. [provider] Taking Active   buprenorphine (BUTRANS) 10 MCG/HR [place] patch [id number] Yes Apply one patch every 72 hours [provider] Taking Active   furosemide (LASIX) 40 MG tablet [id number] Yes Take 1 tablet (40 mg total) by mouth daily.  Patient taking differently: Take 20 mg by mouth daily.    Velvet Bathe, MD Taking Active Self  metFORMIN (GLUCOPHAGE) 850 MG tablet [id number] Yes Take 1 tablet by mouth 2 (two) times daily. [provider] Taking Active   metoprolol succinate (TOPROL-XL) 25 MG 24 hr tablet [id number] Yes Take 37.5 mg by mouth daily. [provider] Taking Active Self  NARCAN 4 MG/0.1ML LIQD nasal spray kit [id number] Yes USE AS DIRECTED AS NEEDED [provider] Taking Active   oxyCODONE-acetaminophen (PERCOCET) 10-325 MG tablet [id number] Yes Take 1 tablet by mouth every 8 (eight) hours as needed for pain.  Patient taking differently: Take 1 tablet by mouth 3 (three) times daily as needed for pain.    Rayburn, Neta Mends, PA-C Taking Active            Med Note Nash Mantis, TIFFANI S   Tue Mar 01, 2019 10:29 AM) LF 5.21.20 QTY 90 30DS  rosuvastatin (CRESTOR) 20 MG tablet [id number] Yes Take 1 tablet by mouth  daily. [provider] Taking Active   sacubitril-valsartan (ENTRESTO) 24-26 MG [id number] Yes Take 1 tablet by mouth 2 (two) times daily.  [provider] Taking Active Self  triamcinolone ointment (KENALOG) 0.5 % [id number] Yes APPLY 1 APPLICATION TOPICALLY twice daily [provider] Taking Active   Vitamin D, Ergocalciferol, (DRISDOL) 1.25 MG (50000 UT) CAPS capsule [id number] Yes Take 1 capsule by mouth once a week. [provider] Taking Active   XTAMPZA ER 27 MG C12A [id number] Yes Take 1 capsule by mouth 2 (two) times daily. [provider] Taking Active          HgA1c 7.1% 8/19  Plan: Follow up with the patient in 4 weeks. Consider closing case as she is being closely followed by [facility] Nurse.  Elayne Guerin, PharmD, [place] Clinical Pharmacist [phone]

## 2019-04-29 DIAGNOSIS — I429 Cardiomyopathy, unspecified: Secondary | ICD-10-CM | POA: Diagnosis not present

## 2019-04-29 DIAGNOSIS — M47819 Spondylosis without myelopathy or radiculopathy, site unspecified: Secondary | ICD-10-CM | POA: Diagnosis not present

## 2019-04-29 DIAGNOSIS — E1140 Type 2 diabetes mellitus with diabetic neuropathy, unspecified: Secondary | ICD-10-CM | POA: Diagnosis not present

## 2019-04-29 DIAGNOSIS — M19071 Primary osteoarthritis, right ankle and foot: Secondary | ICD-10-CM | POA: Diagnosis not present

## 2019-04-29 DIAGNOSIS — M19042 Primary osteoarthritis, left hand: Secondary | ICD-10-CM | POA: Diagnosis not present

## 2019-04-29 DIAGNOSIS — G4733 Obstructive sleep apnea (adult) (pediatric): Secondary | ICD-10-CM | POA: Diagnosis not present

## 2019-04-29 DIAGNOSIS — M19041 Primary osteoarthritis, right hand: Secondary | ICD-10-CM | POA: Diagnosis not present

## 2019-04-29 DIAGNOSIS — N182 Chronic kidney disease, stage 2 (mild): Secondary | ICD-10-CM | POA: Diagnosis not present

## 2019-04-29 DIAGNOSIS — I130 Hypertensive heart and chronic kidney disease with heart failure and stage 1 through stage 4 chronic kidney disease, or unspecified chronic kidney disease: Secondary | ICD-10-CM | POA: Diagnosis not present

## 2019-04-29 DIAGNOSIS — E785 Hyperlipidemia, unspecified: Secondary | ICD-10-CM | POA: Diagnosis not present

## 2019-04-29 DIAGNOSIS — I509 Heart failure, unspecified: Secondary | ICD-10-CM | POA: Diagnosis not present

## 2019-04-29 DIAGNOSIS — E1122 Type 2 diabetes mellitus with diabetic chronic kidney disease: Secondary | ICD-10-CM | POA: Diagnosis not present

## 2019-04-29 DIAGNOSIS — M19072 Primary osteoarthritis, left ankle and foot: Secondary | ICD-10-CM | POA: Diagnosis not present

## 2019-05-03 DIAGNOSIS — M25512 Pain in left shoulder: Secondary | ICD-10-CM | POA: Diagnosis not present

## 2019-05-03 DIAGNOSIS — I10 Essential (primary) hypertension: Secondary | ICD-10-CM | POA: Diagnosis not present

## 2019-05-03 DIAGNOSIS — M545 Low back pain: Secondary | ICD-10-CM | POA: Diagnosis not present

## 2019-05-03 DIAGNOSIS — Z1159 Encounter for screening for other viral diseases: Secondary | ICD-10-CM | POA: Diagnosis not present

## 2019-05-03 DIAGNOSIS — M79672 Pain in left foot: Secondary | ICD-10-CM | POA: Diagnosis not present

## 2019-05-03 DIAGNOSIS — M25572 Pain in left ankle and joints of left foot: Secondary | ICD-10-CM | POA: Diagnosis not present

## 2019-05-03 DIAGNOSIS — Z79899 Other long term (current) drug therapy: Secondary | ICD-10-CM | POA: Diagnosis not present

## 2019-05-03 NOTE — Telephone Encounter (Signed)
I have reviewed the medications for home health management and pain management.

## 2019-05-04 ENCOUNTER — Ambulatory Visit: Payer: Self-pay | Admitting: Cardiology

## 2019-05-06 ENCOUNTER — Other Ambulatory Visit: Payer: Self-pay

## 2019-05-06 DIAGNOSIS — H1031 Unspecified acute conjunctivitis, right eye: Secondary | ICD-10-CM | POA: Diagnosis not present

## 2019-05-06 DIAGNOSIS — H20011 Primary iridocyclitis, right eye: Secondary | ICD-10-CM | POA: Diagnosis not present

## 2019-05-06 NOTE — Patient Outreach (Signed)
[facility]) Care Management  05/06/2019  [name] 1966-05-10 [id number]   Brief outreach with [name]. She reports edema, drainage and pain to her right eye over the past week. Reports noticing the symptoms on last Thursday and discussing concerns with her Primary Care NP and Pain Management provider. Ofloxacin solution was prescribed. She reports using the solution four times a day as recommended.  Today she reports mild improvement but still unclear as to what prompted the symptoms. She is aware of worsening s/sx and indications for seeking immediate medical attention. She will be evaluated at [facility] at 3pm today.  PLAN -Will follow-up within two weeks.  [place] Care Management [phone]

## 2019-05-19 ENCOUNTER — Ambulatory Visit: Payer: Medicare Other | Admitting: Cardiology

## 2019-05-20 ENCOUNTER — Other Ambulatory Visit: Payer: Self-pay

## 2019-05-20 NOTE — Patient Outreach (Signed)
[facility]) Care Management  05/20/2019  [name] Feb 12, 1966 [id number]   Follow-up outreach with [name]. She reports feeling well today and reports the edema to her right eye has significantly improved. Reports being diagnosed with Iritis and using steroid eyedrops as prescribed.  She reports compliance with medications. Denies new concerns regarding prescription costs.   She continues to attend provider appointments as scheduled. Denies current need for transportation assistance. She will complete a sleep study on 05/25/19. Pending follow-up with Cardiology on 06/08/19.  [name] denies changes in care management needs. Agreed to call if needed prior to next scheduled outreach. Current Outpatient Medications on File Prior to Visit  Medication Sig Dispense Refill  . allopurinol (ZYLOPRIM) 300 MG tablet Take 1 tablet by mouth daily.    Marland Kitchen ALPRAZolam (XANAX) 0.25 MG tablet Take 1 tablet by mouth 2 (two) times daily as needed.    . buprenorphine (BUTRANS) 10 MCG/HR PTWK patch Apply one patch every 72 hours    . furosemide (LASIX) 40 MG tablet Take 1 tablet (40 mg total) by mouth daily. (Patient taking differently: Take 20 mg by mouth daily. ) 30 tablet 0  . metFORMIN (GLUCOPHAGE) 850 MG tablet Take 1 tablet by mouth 2 (two) times daily.    . metoprolol succinate (TOPROL-XL) 25 MG 24 hr tablet Take 37.5 mg by mouth daily.    Marland Kitchen NARCAN 4 MG/0.1ML LIQD nasal spray kit USE AS DIRECTED AS NEEDED    . oxyCODONE-acetaminophen (PERCOCET) 10-325 MG tablet Take 1 tablet by mouth every 8 (eight) hours as needed for pain. (Patient taking differently: Take 1 tablet by mouth 3 (three) times daily as needed for pain. ) 28 tablet 0  . PREDNISOLONE ACETATE PO Take by mouth.    . rosuvastatin (CRESTOR) 20 MG tablet Take 1 tablet by mouth daily.    . sacubitril-valsartan (ENTRESTO) 24-26 MG Take 1 tablet by mouth 2 (two) times daily.     Marland Kitchen triamcinolone ointment (KENALOG) 0.5 % APPLY 1  APPLICATION TOPICALLY twice daily    . XTAMPZA ER 27 MG C12A Take 1 capsule by mouth 2 (two) times daily.     No current facility-administered medications on file prior to visit.    PLAN -Will continue routine outreach.  [place] Care Management [phone]

## 2019-05-25 ENCOUNTER — Ambulatory Visit (HOSPITAL_BASED_OUTPATIENT_CLINIC_OR_DEPARTMENT_OTHER): Payer: Medicare Other | Attending: Nurse Practitioner | Admitting: Internal Medicine

## 2019-05-25 ENCOUNTER — Other Ambulatory Visit: Payer: Self-pay

## 2019-05-25 VITALS — Ht 65.0 in | Wt 368.0 lb

## 2019-05-25 DIAGNOSIS — R5383 Other fatigue: Secondary | ICD-10-CM | POA: Diagnosis not present

## 2019-05-25 DIAGNOSIS — G4710 Hypersomnia, unspecified: Secondary | ICD-10-CM | POA: Diagnosis not present

## 2019-05-25 DIAGNOSIS — G4730 Sleep apnea, unspecified: Secondary | ICD-10-CM | POA: Diagnosis not present

## 2019-05-25 DIAGNOSIS — R0683 Snoring: Secondary | ICD-10-CM | POA: Diagnosis not present

## 2019-05-27 ENCOUNTER — Other Ambulatory Visit: Payer: Self-pay | Admitting: Pharmacist

## 2019-05-27 NOTE — Patient Outreach (Signed)
[place] [facility]) Care Management  05/27/2019  [name] March 26, 1966 [id number]  Patient was called to follow up on medication management. HIPAA identifiers were obtained. Patient reported feeling well and not having any complaints.  Her medications were reviewed: Medications Reviewed Today    Reviewed by Elayne Guerin, [facility] (Pharmacist) on 05/27/19 at [phone]  Med List Status: <None>  Medication Order Taking? Sig Documenting Provider Last Dose Status Informant  allopurinol (ZYLOPRIM) 300 MG tablet [id number] Yes Take 1 tablet by mouth daily. [provider] Taking Active   ALPRAZolam Duanne Moron) 0.25 MG tablet [id number] Yes Take 1 tablet by mouth 2 (two) times daily as needed. [provider] Taking Active   buprenorphine (BUTRANS) 10 MCG/HR [place] patch [id number] Yes Apply one patch every 72 hours [provider] Taking Active   furosemide (LASIX) 40 MG tablet [id number] Yes Take 1 tablet (40 mg total) by mouth daily.  Patient taking differently: Take 20 mg by mouth daily.    Velvet Bathe, MD Taking Active Self  metFORMIN (GLUCOPHAGE) 850 MG tablet [id number] Yes Take 1 tablet by mouth 2 (two) times daily. [provider] Taking Active   metoprolol succinate (TOPROL-XL) 25 MG 24 hr tablet [id number] Yes Take 37.5 mg by mouth daily. [provider] Taking Active Self  NARCAN 4 MG/0.1ML LIQD nasal spray kit [id number] Yes USE AS DIRECTED AS NEEDED [provider] Taking Active   oxyCODONE-acetaminophen (PERCOCET) 10-325 MG tablet [id number] Yes Take 1 tablet by mouth every 8 (eight) hours as needed for pain.  Patient taking differently: Take 1 tablet by mouth 3 (three) times daily as needed for pain.    Rayburn, Neta Mends, PA-C Taking Active            Med Note Nash Mantis, TIFFANI S   Tue Mar 01, 2019 10:29 AM) LF 5.21.20 QTY 90 30DS  rosuvastatin (CRESTOR) 20 MG tablet [id number] Yes Take 1 tablet by mouth daily. [provider] Taking Active   sacubitril-valsartan (ENTRESTO) 24-26 MG [id number] Yes Take 1 tablet by mouth 2 (two) times daily.  [provider] Taking Active Self  triamcinolone ointment (KENALOG) 0.5 % [id number] Yes APPLY 1 APPLICATION TOPICALLY twice daily [provider] Taking Active   XTAMPZA ER 27 MG C12A [id number] Yes Take 1 capsule by mouth 2 (two) times daily. [provider] Taking Active          Patient confirmed she stopped taking lisinopril.  Hemoglobin A1c 7.1% 8/19 (needs to be redrawn)  Plan: Close patient's pharmacy case as she is being closely followed by [place], [facility] and did not have any other medication related concerns.  Elayne Guerin, PharmD, [place] Clinical Pharmacist [phone]

## 2019-05-31 DIAGNOSIS — Z79899 Other long term (current) drug therapy: Secondary | ICD-10-CM | POA: Diagnosis not present

## 2019-05-31 DIAGNOSIS — Z1159 Encounter for screening for other viral diseases: Secondary | ICD-10-CM | POA: Diagnosis not present

## 2019-05-31 DIAGNOSIS — E559 Vitamin D deficiency, unspecified: Secondary | ICD-10-CM | POA: Diagnosis not present

## 2019-05-31 DIAGNOSIS — M129 Arthropathy, unspecified: Secondary | ICD-10-CM | POA: Diagnosis not present

## 2019-05-31 DIAGNOSIS — M79672 Pain in left foot: Secondary | ICD-10-CM | POA: Diagnosis not present

## 2019-05-31 DIAGNOSIS — M25512 Pain in left shoulder: Secondary | ICD-10-CM | POA: Diagnosis not present

## 2019-05-31 DIAGNOSIS — M25572 Pain in left ankle and joints of left foot: Secondary | ICD-10-CM | POA: Diagnosis not present

## 2019-05-31 DIAGNOSIS — Z23 Encounter for immunization: Secondary | ICD-10-CM | POA: Diagnosis not present

## 2019-05-31 DIAGNOSIS — M109 Gout, unspecified: Secondary | ICD-10-CM | POA: Diagnosis not present

## 2019-06-02 DIAGNOSIS — S83104A Unspecified dislocation of right knee, initial encounter: Secondary | ICD-10-CM | POA: Diagnosis not present

## 2019-06-02 DIAGNOSIS — Q762 Congenital spondylolisthesis: Secondary | ICD-10-CM | POA: Diagnosis not present

## 2019-06-02 DIAGNOSIS — Z96651 Presence of right artificial knee joint: Secondary | ICD-10-CM | POA: Diagnosis not present

## 2019-06-04 DIAGNOSIS — G4733 Obstructive sleep apnea (adult) (pediatric): Secondary | ICD-10-CM

## 2019-06-04 DIAGNOSIS — R0683 Snoring: Secondary | ICD-10-CM | POA: Diagnosis not present

## 2019-06-04 NOTE — Procedures (Signed)
    Patient Name: [name], [name] Date: 05/25/2019 Gender: Female D.O.B: 1966/06/19 Age (years): 62 Referring Provider: Selina Cooley FNP Height (inches): 41 Interpreting Physician: Baird Lyons MD, ABSM Weight (lbs): 368 RPSGT: Jacolyn Reedy BMI: 61 MRN: [id number] Neck Size: 16.50  CLINICAL INFORMATION Sleep Study Type: HST Indication for sleep study: Excessive Daytime Sleepiness, Fatigue, Snoring Epworth Sleepiness Score: 9  SLEEP STUDY TECHNIQUE A multi-channel overnight portable sleep study was performed. The channels recorded were: nasal airflow, thoracic respiratory movement, and oxygen saturation with a pulse oximetry. Snoring was also monitored.  MEDICATIONS Patient self administered medications include: none reported.  SLEEP ARCHITECTURE Patient was studied for 310.3 minutes. The sleep efficiency was 100.0 % and the patient was supine for 100%. The arousal index was 0.0 per hour.  RESPIRATORY PARAMETERS The overall AHI was 22.2 per hour, with a central apnea index of 0.0 per hour. The oxygen nadir was 74% during sleep.  CARDIAC DATA Mean heart rate during sleep was 91.5 bpm.  IMPRESSIONS - Moderate obstructive sleep apnea occurred during this study (AHI = 22.2/h). - No significant central sleep apnea occurred during this study (CAI = 0.0/h). - Oxygen desaturation was noted during this study (Min O2 = 74%). Mean sat 93%. - Patient snored.  DIAGNOSIS - Obstructive Sleep Apnea (327.23 [G47.33 ICD-10])  RECOMMENDATIONS - Suggest CPAP titration sleep study or autopap. Other options would be based on clinical judgment. - Be careful with alcohol, sedatives and other CNS depressants that may worsen sleep apnea and disrupt normal sleep architecture. - Sleep hygiene should be reviewed to assess factors that may improve sleep quality. - Weight management and regular exercise should be initiated or continued.  [Electronically signed] 06/04/2019 02:42 PM  Baird Lyons MD, [place], American Board of Sleep Medicine   NPI: [id number]                         [place], [place] of Sleep Medicine  ELECTRONICALLY SIGNED ON:  06/04/2019, 2:39 PM [place] PH: [phone]   FX: [phone] [place]

## 2019-06-07 DIAGNOSIS — S83104A Unspecified dislocation of right knee, initial encounter: Secondary | ICD-10-CM | POA: Diagnosis not present

## 2019-06-07 DIAGNOSIS — Z96651 Presence of right artificial knee joint: Secondary | ICD-10-CM | POA: Diagnosis not present

## 2019-06-07 DIAGNOSIS — Q762 Congenital spondylolisthesis: Secondary | ICD-10-CM | POA: Diagnosis not present

## 2019-06-08 ENCOUNTER — Other Ambulatory Visit: Payer: Self-pay

## 2019-06-08 ENCOUNTER — Encounter: Payer: Self-pay | Admitting: Cardiology

## 2019-06-08 ENCOUNTER — Ambulatory Visit (INDEPENDENT_AMBULATORY_CARE_PROVIDER_SITE_OTHER): Payer: Medicare Other | Admitting: Cardiology

## 2019-06-08 VITALS — BP 143/85 | HR 65 | Temp 97.3°F | Ht 63.0 in | Wt 346.0 lb

## 2019-06-08 DIAGNOSIS — I5022 Chronic systolic (congestive) heart failure: Secondary | ICD-10-CM | POA: Diagnosis not present

## 2019-06-08 DIAGNOSIS — I10 Essential (primary) hypertension: Secondary | ICD-10-CM | POA: Diagnosis not present

## 2019-06-08 MED ORDER — ENTRESTO 49-51 MG PO TABS: 1.0000 | ORAL_TABLET | Freq: Two times a day (BID) | ORAL | 2 refills | Status: DC

## 2019-06-08 NOTE — Progress Notes (Signed)
Follow up visit  Subjective:   [name], female    DOB: [birth year], 53 y.o.   MRN: [id number]   Chief complaint: Cardiomyopathy   HPI  53 year old African-American female with hypertension, type II diabetes mellitus, tobacco abuse, morbid obesity BMI 67, heart failure with reduced ejection fraction (likely nonischemic cardiomyopathy), neagtive nuclear stress test 01/2018.  Patient underwent a knee surgery in June 2019 and has had to undergo repeat surgery due to some complications. Her physical activity has been limited due to this. She is now beginning to walk more, although needs power chair for getting around places. She denies more than usual exertional dyspnea, continues to have stable leg edema. She denies chest pain. She has resumed Entresto.    Past Medical History:  Diagnosis Date  . Arthritis    "hands, arms, feet, back, knees" (02/17/2018)  . Cardiomyopathy [facility])    From Dr. Bonney Roussel office notes from 01/13/18  . CHF (congestive heart failure) ([place])   . Chronic lower back pain   . Diabetic neuropathy ([place])   . Family history of adverse reaction to anesthesia    Mother is hard to arouse and blood pressure drops  . GERD (gastroesophageal reflux disease)   . High cholesterol   . Hypertension   . Sleep apnea    "need new machine"  - does not use a cpap (02/17/2018)  . Type II diabetes mellitus ([place])    no longer on medications (02/17/2018)     Past Surgical History:  Procedure Laterality Date  . ANTERIOR CERVICAL DECOMP/DISCECTOMY FUSION  03/06/2010   C4-7 w/corpectomy/notes 03/17/2010  . BACK SURGERY    . CARPAL TUNNEL RELEASE Right   . HARDWARE REVISION  04/10/2010   cervical hardware revision screw replacement C4/notes 04/12/2010  . JOINT REPLACEMENT    . LAPAROSCOPIC CHOLECYSTECTOMY  1999  . LUMBAR LAMINECTOMY/DECOMPRESSION MICRODISCECTOMY  09/2010   Archie Endo 10/05/2010  . MULTIPLE EXTRACTIONS WITH ALVEOLOPLASTY [name] 01/19/2015   Procedure: MULTIPLE  EXTRACTIONS Three, Four, Five, Seven, eight, nine, ten, eleven, twelve, fourteen, eighteen, twenty-one, twenty-eight, twenty-nine, thirty-one with Alveoloplasty.  ;  Surgeon: Diona Browner, DDS;  Location: [place];  Service: Oral Surgery;  Laterality: [name];  . REPLACEMENT TOTAL KNEE BILATERAL Bilateral 10/2007-03/2008   "left-right"  . TOENAIL EXCISION Bilateral    great toes  . TONSILLECTOMY    . TOTAL KNEE REVISION Right 02/17/2018  . TOTAL KNEE REVISION Right 02/17/2018   Procedure: RIGHT TOTAL KNEE REVISION;  Surgeon: Newt Minion, MD;  Location: [place];  Service: Orthopedics;  Laterality: Right;  . TOTAL KNEE REVISION Right 05/14/2018   Procedure: RIGHT TOTAL KNEE REVISION TO HINGED KNEE;  Surgeon: Newt Minion, MD;  Location: [place];  Service: Orthopedics;  Laterality: Right;     Social History   Socioeconomic History  . Marital status: Widowed    Spouse name: Not on file  . Number of children: 2  . Years of education: Not on file  . Highest education level: Not on file  Occupational History  . Not on file  Social Needs  . Financial resource strain: Not on file  . Food insecurity    Worry: Sometimes true    Inability: Not on file  . Transportation needs    Medical: No    Non-medical: No  Tobacco Use  . Smoking status: Current Every Day Smoker    Packs/day: 0.50    Years: 32.00    Pack years: 16.00    Types: Cigarettes  .  Smokeless tobacco: Never Used  Substance and Sexual Activity  . Alcohol use: Not Currently  . Drug use: Not Currently    Types: Marijuana  . Sexual activity: Not Currently  Lifestyle  . Physical activity    Days per week: Not on file    Minutes per session: Not on file  . Stress: Not on file  Relationships  . Social Herbalist on phone: Not on file    Gets together: Not on file    Attends religious service: Not on file    Active member of club or organization: Not on file    Attends meetings of clubs or organizations: Not on file     Relationship status: Not on file  . Intimate partner violence    Fear of current or ex partner: Not on file    Emotionally abused: Not on file    Physically abused: Not on file    Forced sexual activity: Not on file  Other Topics Concern  . Not on file  Social History Narrative  . Not on file     Family History  Problem Relation Age of Onset  . Kidney disease Mother   . Congestive Heart Failure Mother   . Hypertension Father      Current Outpatient Medications on File Prior to Visit  Medication Sig Dispense Refill  . allopurinol (ZYLOPRIM) 300 MG tablet Take 1 tablet by mouth daily.    Marland Kitchen ALPRAZolam (XANAX) 0.25 MG tablet Take 1 tablet by mouth 2 (two) times daily as needed.    . buprenorphine (BUTRANS) 10 MCG/HR PTWK patch Apply one patch every 72 hours    . furosemide (LASIX) 40 MG tablet Take 1 tablet (40 mg total) by mouth daily. (Patient taking differently: Take 20 mg by mouth daily. ) 30 tablet 0  . metFORMIN (GLUCOPHAGE) 850 MG tablet Take 1 tablet by mouth 2 (two) times daily.    . metoprolol succinate (TOPROL-XL) 25 MG 24 hr tablet Take 37.5 mg by mouth daily.    Marland Kitchen NARCAN 4 MG/0.1ML LIQD nasal spray kit USE AS DIRECTED AS NEEDED    . oxyCODONE-acetaminophen (PERCOCET) 10-325 MG tablet Take 1 tablet by mouth every 8 (eight) hours as needed for pain. (Patient taking differently: Take 1 tablet by mouth 3 (three) times daily as needed for pain. ) 28 tablet 0  . rosuvastatin (CRESTOR) 20 MG tablet Take 1 tablet by mouth daily.    . sacubitril-valsartan (ENTRESTO) 24-26 MG Take 1 tablet by mouth 2 (two) times daily.     Marland Kitchen triamcinolone ointment (KENALOG) 0.5 % APPLY 1 APPLICATION TOPICALLY twice daily    . XTAMPZA ER 27 MG C12A Take 1 capsule by mouth 2 (two) times daily.     No current facility-administered medications on file prior to visit.     Cardiovascular studies:  Lexiscan nucler stress test 01/08/2018 [facility]):" 1. No reversible ischemia or infarction.  2.  Global hypokinesis with severe left ventricular dilatation.  3. Left ventricular ejection fraction 31%  4. High-risk stress test findings*.  Echocardiogram 11/06/2017: Mild concentric LVH. LVEF 33%. Suggest MRI of contrast echocardiogram due to poor LV visualization. Grade 1 diastolic dysfunction Mild RV dilatation No significant valvular abnormality. RVSP 25 mmHg Dilated IVC with poor respiratory variation.  Recent labs: 01/29/2018: Glucose 180. BUN/Cr 28/1.27. eGFR 57. Na/K 139/5.4 H/H 14/41. MCV 90. Platelets 155   Review of Systems  Constitution: Negative for decreased appetite, malaise/fatigue, weight gain and weight loss.  HENT:  Negative for congestion.   Eyes: Negative for visual disturbance.  Cardiovascular: Positive for dyspnea on exertion and leg swelling. Negative for chest pain, palpitations and syncope.  Respiratory: Negative for cough.   Endocrine: Negative for cold intolerance.  Hematologic/Lymphatic: Does not bruise/bleed easily.  Skin: Negative for itching and rash.  Musculoskeletal: Positive for joint pain. Negative for myalgias.  Gastrointestinal: Negative for abdominal pain, nausea and vomiting.  Genitourinary: Negative for dysuria.  Neurological: Negative for dizziness and weakness.  Psychiatric/Behavioral: The patient is not nervous/anxious.   All other systems reviewed and are negative.       Today's Vitals   06/08/19 1137  BP: (!) 143/85  Pulse: 65  Temp: (!) 97.3 F (36.3 C)  SpO2: 96%  Weight: (!) 346 lb (156.9 kg)  Height: _0  (1.6 m)   Body mass index is 61.29 kg/m.   Objective:   Physical Exam  Constitutional: She is oriented to person, place, and time. She appears well-developed and well-nourished. No distress.  Morbid obesity  HENT:  Head: Normocephalic and atraumatic.  Eyes: Pupils are equal, round, and reactive to light. Conjunctivae are normal.  Neck: No JVD present.  Cardiovascular: Normal rate and regular rhythm. Exam  reveals decreased pulses.  Pulmonary/Chest: Effort normal and breath sounds normal. She has no wheezes. She has no rales.  Abdominal: Soft. Bowel sounds are normal. There is no rebound.  Musculoskeletal:        General: Edema (2+ edema) present.  Lymphadenopathy:    She has no cervical adenopathy.  Neurological: She is alert and oriented to person, place, and time. No cranial nerve deficit.  Skin: Skin is warm and dry.  Psychiatric: She has a normal mood and affect.  Nursing note and vitals reviewed.         Assessment & Recommendations:   53 year old African-American female with hypertension, type II diabetes mellitus, tobacco abuse, morbid obesity BMI 67, heart failure with reduced ejection fraction (likely nonischemic cardiomyopathy), neagtive nuclear stress test 01/2018.  HFrEF: NYHA class II-III symptoms. Increase Entresto to 49-51 mg bid.  Continue metoprolol succinate 25 mg daily.  Continue lasix 20 mg daily. Will check BMP and echocardiogram. F/u in 4 weeks.  Morbid obesity: Remains her primary medical comorbidity. I have encouraged her to discuss with her PCP regarding weight loss strategies, including surgical weight loss.    Nigel Mormon, MD [facility] Pager: [phone] Office: [phone] If no answer Cell [phone]

## 2019-06-09 NOTE — Addendum Note (Signed)
Addended by: Nigel Mormon on: 06/09/2019 01:18 PM   Modules accepted: Orders

## 2019-06-23 ENCOUNTER — Other Ambulatory Visit: Payer: Self-pay

## 2019-06-23 NOTE — Patient Outreach (Signed)
[facility]) Care Management  06/23/2019  [name] December 14, 1965 [id number]     Brief outreach with [name]. She reports feeling well today but has concerns regarding weight fluctuations and fluid retention over the last few weeks. She was unable to recall exact readings but noticed "tightness" around her abdomen, particularly when applying a seatbelt. She reports compliance with prescribed medications. She denies shortness of breath. No chest pain or palpitations. Reports "normal" swelling in her lower extremities. She was evaluated by her Cardiologist on 06/08/19. She is scheduled for an echocardiogram next week.   She is agreeable to follow-up next week. She verbalized awareness of worsening signs and symptoms that require immediate medical attention. Current Outpatient Medications on File Prior to Visit  Medication Sig Dispense Refill  . allopurinol (ZYLOPRIM) 300 MG tablet Take 1 tablet by mouth daily.    Marland Kitchen ALPRAZolam (XANAX) 0.25 MG tablet Take 1 tablet by mouth 2 (two) times daily as needed.    . buprenorphine (BUTRANS) 10 MCG/HR PTWK patch Apply one patch every 72 hours    . furosemide (LASIX) 40 MG tablet Take 1 tablet (40 mg total) by mouth daily. (Patient taking differently: Take 20 mg by mouth daily. ) 30 tablet 0  . metFORMIN (GLUCOPHAGE) 850 MG tablet Take 1 tablet by mouth 2 (two) times daily.    . metoprolol succinate (TOPROL-XL) 25 MG 24 hr tablet Take 37.5 mg by mouth daily.    Marland Kitchen NARCAN 4 MG/0.1ML LIQD nasal spray kit USE AS DIRECTED AS NEEDED    . oxyCODONE-acetaminophen (PERCOCET) 10-325 MG tablet Take 1 tablet by mouth every 8 (eight) hours as needed for pain. (Patient taking differently: Take 1 tablet by mouth 3 (three) times daily as needed for pain. ) 28 tablet 0  . sacubitril-valsartan (ENTRESTO) 49-51 MG Take 1 tablet by mouth 2 (two) times daily. 60 tablet 2  . triamcinolone ointment (KENALOG) 0.5 % APPLY 1 APPLICATION TOPICALLY twice daily    .  XTAMPZA ER 27 MG C12A Take 1 capsule by mouth 2 (two) times daily.     No current facility-administered medications on file prior to visit.     PLAN -Will follow-up within a week.   [place] Care Management [phone]

## 2019-06-27 ENCOUNTER — Other Ambulatory Visit: Payer: Medicare Other

## 2019-06-28 DIAGNOSIS — M25572 Pain in left ankle and joints of left foot: Secondary | ICD-10-CM | POA: Diagnosis not present

## 2019-06-28 DIAGNOSIS — Z79899 Other long term (current) drug therapy: Secondary | ICD-10-CM | POA: Diagnosis not present

## 2019-06-28 DIAGNOSIS — G8929 Other chronic pain: Secondary | ICD-10-CM | POA: Diagnosis not present

## 2019-06-28 DIAGNOSIS — Z1159 Encounter for screening for other viral diseases: Secondary | ICD-10-CM | POA: Diagnosis not present

## 2019-06-28 DIAGNOSIS — M545 Low back pain: Secondary | ICD-10-CM | POA: Diagnosis not present

## 2019-06-28 DIAGNOSIS — M79672 Pain in left foot: Secondary | ICD-10-CM | POA: Diagnosis not present

## 2019-06-29 ENCOUNTER — Other Ambulatory Visit: Payer: Medicare Other

## 2019-07-01 ENCOUNTER — Other Ambulatory Visit: Payer: Self-pay

## 2019-07-01 NOTE — Patient Outreach (Signed)
[facility]) Care Management  07/01/2019  [name] April 30, 1966 [id number]    Attempted follow-up outreach with [name]. Left message requesting a return call.   PLAN -Pending return call. -Will follow-up next week if call is not returned today.     [place] Care Management [phone]

## 2019-07-06 ENCOUNTER — Other Ambulatory Visit: Payer: Medicare Other

## 2019-07-06 NOTE — Progress Notes (Signed)
No show

## 2019-07-07 ENCOUNTER — Ambulatory Visit: Payer: Medicare Other | Admitting: Cardiology

## 2019-07-08 DIAGNOSIS — Z91199 Patient's noncompliance with other medical treatment and regimen due to unspecified reason: Secondary | ICD-10-CM | POA: Insufficient documentation

## 2019-07-08 DIAGNOSIS — S83104A Unspecified dislocation of right knee, initial encounter: Secondary | ICD-10-CM | POA: Diagnosis not present

## 2019-07-08 DIAGNOSIS — Q762 Congenital spondylolisthesis: Secondary | ICD-10-CM | POA: Diagnosis not present

## 2019-07-08 DIAGNOSIS — Z96651 Presence of right artificial knee joint: Secondary | ICD-10-CM | POA: Diagnosis not present

## 2019-07-08 DIAGNOSIS — Z5329 Procedure and treatment not carried out because of patient's decision for other reasons: Secondary | ICD-10-CM | POA: Insufficient documentation

## 2019-07-11 ENCOUNTER — Ambulatory Visit: Payer: Medicare Other | Admitting: Cardiology

## 2019-07-14 ENCOUNTER — Other Ambulatory Visit: Payer: Medicare Other

## 2019-07-15 NOTE — Patient Outreach (Signed)
[facility]) Care Management    [name] 12/15/1965 [id number]   Late Entry:

## 2019-07-25 ENCOUNTER — Other Ambulatory Visit: Payer: Medicare Other

## 2019-07-25 DIAGNOSIS — Z5329 Procedure and treatment not carried out because of patient's decision for other reasons: Secondary | ICD-10-CM

## 2019-07-26 DIAGNOSIS — M25572 Pain in left ankle and joints of left foot: Secondary | ICD-10-CM | POA: Diagnosis not present

## 2019-07-26 DIAGNOSIS — Z1159 Encounter for screening for other viral diseases: Secondary | ICD-10-CM | POA: Diagnosis not present

## 2019-07-26 DIAGNOSIS — M545 Low back pain: Secondary | ICD-10-CM | POA: Diagnosis not present

## 2019-07-26 DIAGNOSIS — Z79899 Other long term (current) drug therapy: Secondary | ICD-10-CM | POA: Diagnosis not present

## 2019-07-26 DIAGNOSIS — M79672 Pain in left foot: Secondary | ICD-10-CM | POA: Diagnosis not present

## 2019-07-26 DIAGNOSIS — M25512 Pain in left shoulder: Secondary | ICD-10-CM | POA: Diagnosis not present

## 2019-08-02 ENCOUNTER — Other Ambulatory Visit: Payer: Self-pay

## 2019-08-02 NOTE — Patient Outreach (Signed)
[facility]) Care Management  08/02/2019  [name] 22-Apr-1966 [id number]   Medication Adherence call to [name] Hippa Identifiers Verify spoke with patient she is past due on Metformin 850 mg,patient explain she takes 1 tablet two times daily,patient explain she had extras from before but ask if we can call [facility] to order this medication,pharmacy will have it ready for patient to pick up. [name] is showing past due under [place].   [place] Management Direct Dial [phone]  Fax [phone] [email or web address]

## 2019-08-05 ENCOUNTER — Ambulatory Visit (INDEPENDENT_AMBULATORY_CARE_PROVIDER_SITE_OTHER): Payer: Medicare Other | Admitting: Cardiology

## 2019-08-05 DIAGNOSIS — Z5329 Procedure and treatment not carried out because of patient's decision for other reasons: Secondary | ICD-10-CM

## 2019-08-05 NOTE — Progress Notes (Signed)
No show

## 2019-08-09 ENCOUNTER — Other Ambulatory Visit: Payer: Self-pay

## 2019-08-15 DIAGNOSIS — Z96651 Presence of right artificial knee joint: Secondary | ICD-10-CM | POA: Diagnosis not present

## 2019-08-15 DIAGNOSIS — S83104A Unspecified dislocation of right knee, initial encounter: Secondary | ICD-10-CM | POA: Diagnosis not present

## 2019-08-15 DIAGNOSIS — Q762 Congenital spondylolisthesis: Secondary | ICD-10-CM | POA: Diagnosis not present

## 2019-08-22 DIAGNOSIS — G894 Chronic pain syndrome: Secondary | ICD-10-CM | POA: Diagnosis not present

## 2019-08-22 DIAGNOSIS — G629 Polyneuropathy, unspecified: Secondary | ICD-10-CM | POA: Diagnosis not present

## 2019-08-22 DIAGNOSIS — J42 Unspecified chronic bronchitis: Secondary | ICD-10-CM | POA: Diagnosis not present

## 2019-08-22 DIAGNOSIS — M545 Low back pain: Secondary | ICD-10-CM | POA: Diagnosis not present

## 2019-08-22 DIAGNOSIS — Z1159 Encounter for screening for other viral diseases: Secondary | ICD-10-CM | POA: Diagnosis not present

## 2019-08-22 DIAGNOSIS — G8929 Other chronic pain: Secondary | ICD-10-CM | POA: Diagnosis not present

## 2019-08-22 DIAGNOSIS — Z79899 Other long term (current) drug therapy: Secondary | ICD-10-CM | POA: Diagnosis not present

## 2019-09-10 ENCOUNTER — Emergency Department (HOSPITAL_COMMUNITY): Payer: Medicare Other

## 2019-09-10 ENCOUNTER — Inpatient Hospital Stay (HOSPITAL_COMMUNITY)
Admission: EM | Admit: 2019-09-10 | Discharge: 2019-09-19 | DRG: 291 | Disposition: A | Discharge status: Home Health | Payer: Medicare Other | Attending: Internal Medicine | Admitting: Internal Medicine

## 2019-09-10 ENCOUNTER — Other Ambulatory Visit: Payer: Self-pay

## 2019-09-10 DIAGNOSIS — K219 Gastro-esophageal reflux disease without esophagitis: Secondary | ICD-10-CM | POA: Diagnosis present

## 2019-09-10 DIAGNOSIS — N1831 Chronic kidney disease, stage 3a: Secondary | ICD-10-CM | POA: Diagnosis not present

## 2019-09-10 DIAGNOSIS — I509 Heart failure, unspecified: Secondary | ICD-10-CM

## 2019-09-10 DIAGNOSIS — Z8249 Family history of ischemic heart disease and other diseases of the circulatory system: Secondary | ICD-10-CM

## 2019-09-10 DIAGNOSIS — N179 Acute kidney failure, unspecified: Secondary | ICD-10-CM | POA: Diagnosis present

## 2019-09-10 DIAGNOSIS — Z7984 Long term (current) use of oral hypoglycemic drugs: Secondary | ICD-10-CM

## 2019-09-10 DIAGNOSIS — Z20822 Contact with and (suspected) exposure to covid-19: Secondary | ICD-10-CM | POA: Diagnosis not present

## 2019-09-10 DIAGNOSIS — G4733 Obstructive sleep apnea (adult) (pediatric): Secondary | ICD-10-CM | POA: Diagnosis present

## 2019-09-10 DIAGNOSIS — I5023 Acute on chronic systolic (congestive) heart failure: Secondary | ICD-10-CM | POA: Diagnosis present

## 2019-09-10 DIAGNOSIS — F17210 Nicotine dependence, cigarettes, uncomplicated: Secondary | ICD-10-CM | POA: Diagnosis present

## 2019-09-10 DIAGNOSIS — Z96653 Presence of artificial knee joint, bilateral: Secondary | ICD-10-CM | POA: Diagnosis not present

## 2019-09-10 DIAGNOSIS — Z79899 Other long term (current) drug therapy: Secondary | ICD-10-CM

## 2019-09-10 DIAGNOSIS — Z841 Family history of disorders of kidney and ureter: Secondary | ICD-10-CM

## 2019-09-10 DIAGNOSIS — M109 Gout, unspecified: Secondary | ICD-10-CM | POA: Diagnosis not present

## 2019-09-10 DIAGNOSIS — R609 Edema, unspecified: Secondary | ICD-10-CM | POA: Diagnosis not present

## 2019-09-10 DIAGNOSIS — E1140 Type 2 diabetes mellitus with diabetic neuropathy, unspecified: Secondary | ICD-10-CM | POA: Diagnosis present

## 2019-09-10 DIAGNOSIS — I130 Hypertensive heart and chronic kidney disease with heart failure and stage 1 through stage 4 chronic kidney disease, or unspecified chronic kidney disease: Secondary | ICD-10-CM | POA: Diagnosis not present

## 2019-09-10 DIAGNOSIS — Z743 Need for continuous supervision: Secondary | ICD-10-CM | POA: Diagnosis not present

## 2019-09-10 DIAGNOSIS — E119 Type 2 diabetes mellitus without complications: Secondary | ICD-10-CM

## 2019-09-10 DIAGNOSIS — E875 Hyperkalemia: Secondary | ICD-10-CM | POA: Diagnosis present

## 2019-09-10 DIAGNOSIS — Z9049 Acquired absence of other specified parts of digestive tract: Secondary | ICD-10-CM

## 2019-09-10 DIAGNOSIS — E1129 Type 2 diabetes mellitus with other diabetic kidney complication: Secondary | ICD-10-CM

## 2019-09-10 DIAGNOSIS — E1122 Type 2 diabetes mellitus with diabetic chronic kidney disease: Secondary | ICD-10-CM | POA: Diagnosis not present

## 2019-09-10 DIAGNOSIS — N182 Chronic kidney disease, stage 2 (mild): Secondary | ICD-10-CM | POA: Diagnosis present

## 2019-09-10 DIAGNOSIS — I10 Essential (primary) hypertension: Secondary | ICD-10-CM | POA: Diagnosis not present

## 2019-09-10 DIAGNOSIS — I428 Other cardiomyopathies: Secondary | ICD-10-CM | POA: Diagnosis present

## 2019-09-10 DIAGNOSIS — Z6841 Body Mass Index (BMI) 40.0 and over, adult: Secondary | ICD-10-CM

## 2019-09-10 DIAGNOSIS — E1165 Type 2 diabetes mellitus with hyperglycemia: Secondary | ICD-10-CM | POA: Diagnosis not present

## 2019-09-10 DIAGNOSIS — R0602 Shortness of breath: Secondary | ICD-10-CM | POA: Diagnosis not present

## 2019-09-10 DIAGNOSIS — G894 Chronic pain syndrome: Secondary | ICD-10-CM | POA: Diagnosis present

## 2019-09-10 DIAGNOSIS — I110 Hypertensive heart disease with heart failure: Secondary | ICD-10-CM | POA: Diagnosis not present

## 2019-09-10 DIAGNOSIS — Z981 Arthrodesis status: Secondary | ICD-10-CM | POA: Diagnosis not present

## 2019-09-10 NOTE — ED Triage Notes (Signed)
Per ems pt was complaining of SOB with excursion, pt was sitting in chair upon arrival and sat's were mid 90's but moving dropped into the high 80's. Pt was placed on 2L nasal canula. Pt also having more swelling in her lower extremities. Pt said has been worsening x 1 week. Pt also has chronic Bronchitis and doing neb treatments at home. No fevers, no chills

## 2019-09-11 DIAGNOSIS — M109 Gout, unspecified: Secondary | ICD-10-CM | POA: Diagnosis present

## 2019-09-11 DIAGNOSIS — N182 Chronic kidney disease, stage 2 (mild): Secondary | ICD-10-CM | POA: Diagnosis not present

## 2019-09-11 DIAGNOSIS — I10 Essential (primary) hypertension: Secondary | ICD-10-CM

## 2019-09-11 DIAGNOSIS — G894 Chronic pain syndrome: Secondary | ICD-10-CM | POA: Diagnosis present

## 2019-09-11 DIAGNOSIS — K219 Gastro-esophageal reflux disease without esophagitis: Secondary | ICD-10-CM | POA: Diagnosis present

## 2019-09-11 DIAGNOSIS — E875 Hyperkalemia: Secondary | ICD-10-CM | POA: Diagnosis not present

## 2019-09-11 DIAGNOSIS — I509 Heart failure, unspecified: Secondary | ICD-10-CM | POA: Diagnosis not present

## 2019-09-11 DIAGNOSIS — Z6841 Body Mass Index (BMI) 40.0 and over, adult: Secondary | ICD-10-CM | POA: Diagnosis not present

## 2019-09-11 DIAGNOSIS — I5023 Acute on chronic systolic (congestive) heart failure: Secondary | ICD-10-CM | POA: Diagnosis not present

## 2019-09-11 DIAGNOSIS — E1140 Type 2 diabetes mellitus with diabetic neuropathy, unspecified: Secondary | ICD-10-CM | POA: Diagnosis present

## 2019-09-11 DIAGNOSIS — Z79899 Other long term (current) drug therapy: Secondary | ICD-10-CM | POA: Diagnosis not present

## 2019-09-11 DIAGNOSIS — E1122 Type 2 diabetes mellitus with diabetic chronic kidney disease: Secondary | ICD-10-CM

## 2019-09-11 DIAGNOSIS — R0602 Shortness of breath: Secondary | ICD-10-CM | POA: Diagnosis not present

## 2019-09-11 DIAGNOSIS — N179 Acute kidney failure, unspecified: Secondary | ICD-10-CM

## 2019-09-11 DIAGNOSIS — Z9049 Acquired absence of other specified parts of digestive tract: Secondary | ICD-10-CM | POA: Diagnosis not present

## 2019-09-11 DIAGNOSIS — Z20822 Contact with and (suspected) exposure to covid-19: Secondary | ICD-10-CM | POA: Diagnosis present

## 2019-09-11 DIAGNOSIS — I130 Hypertensive heart and chronic kidney disease with heart failure and stage 1 through stage 4 chronic kidney disease, or unspecified chronic kidney disease: Secondary | ICD-10-CM | POA: Diagnosis present

## 2019-09-11 DIAGNOSIS — F17210 Nicotine dependence, cigarettes, uncomplicated: Secondary | ICD-10-CM | POA: Diagnosis present

## 2019-09-11 DIAGNOSIS — Z7401 Bed confinement status: Secondary | ICD-10-CM | POA: Diagnosis not present

## 2019-09-11 DIAGNOSIS — Z841 Family history of disorders of kidney and ureter: Secondary | ICD-10-CM | POA: Diagnosis not present

## 2019-09-11 DIAGNOSIS — G4733 Obstructive sleep apnea (adult) (pediatric): Secondary | ICD-10-CM | POA: Diagnosis present

## 2019-09-11 DIAGNOSIS — N1831 Chronic kidney disease, stage 3a: Secondary | ICD-10-CM | POA: Diagnosis present

## 2019-09-11 DIAGNOSIS — M2550 Pain in unspecified joint: Secondary | ICD-10-CM | POA: Diagnosis not present

## 2019-09-11 DIAGNOSIS — Z981 Arthrodesis status: Secondary | ICD-10-CM | POA: Diagnosis not present

## 2019-09-11 DIAGNOSIS — Z8249 Family history of ischemic heart disease and other diseases of the circulatory system: Secondary | ICD-10-CM | POA: Diagnosis not present

## 2019-09-11 DIAGNOSIS — R5381 Other malaise: Secondary | ICD-10-CM | POA: Diagnosis not present

## 2019-09-11 DIAGNOSIS — Z96653 Presence of artificial knee joint, bilateral: Secondary | ICD-10-CM | POA: Diagnosis present

## 2019-09-11 DIAGNOSIS — I428 Other cardiomyopathies: Secondary | ICD-10-CM | POA: Diagnosis present

## 2019-09-11 DIAGNOSIS — Z7984 Long term (current) use of oral hypoglycemic drugs: Secondary | ICD-10-CM | POA: Diagnosis not present

## 2019-09-11 LAB — CBC WITH DIFFERENTIAL/PLATELET
Abs Immature Granulocytes: 0.02 10*3/uL (ref 0.00–0.07)
Basophils Absolute: 0 10*3/uL (ref 0.0–0.1)
Basophils Relative: 1 %
Eosinophils Absolute: 0.1 10*3/uL (ref 0.0–0.5)
Eosinophils Relative: 1 %
HCT: 38.4 % (ref 36.0–46.0)
Hemoglobin: 11.8 g/dL — ABNORMAL LOW (ref 12.0–15.0)
Immature Granulocytes: 0 %
Lymphocytes Relative: 20 %
Lymphs Abs: 1.2 10*3/uL (ref 0.7–4.0)
MCH: 30.4 pg (ref 26.0–34.0)
MCHC: 30.7 g/dL (ref 30.0–36.0)
MCV: 99 fL (ref 80.0–100.0)
Monocytes Absolute: 0.4 10*3/uL (ref 0.1–1.0)
Monocytes Relative: 7 %
Neutro Abs: 4.4 10*3/uL (ref 1.7–7.7)
Neutrophils Relative %: 71 %
Platelets: 210 10*3/uL (ref 150–400)
RBC: 3.88 MIL/uL (ref 3.87–5.11)
RDW: 13.6 % (ref 11.5–15.5)
WBC: 6.2 10*3/uL (ref 4.0–10.5)
nRBC: 0 % (ref 0.0–0.2)

## 2019-09-11 LAB — CBG MONITORING, ED
Glucose-Capillary: 250 mg/dL — ABNORMAL HIGH (ref 70–99)
Glucose-Capillary: 303 mg/dL — ABNORMAL HIGH (ref 70–99)
Glucose-Capillary: 134 mg/dL — ABNORMAL HIGH (ref 70–99)

## 2019-09-11 LAB — COMPREHENSIVE METABOLIC PANEL
ALT: 18 U/L (ref 0–44)
AST: 26 U/L (ref 15–41)
Albumin: 3.1 g/dL — ABNORMAL LOW (ref 3.5–5.0)
Alkaline Phosphatase: 150 U/L — ABNORMAL HIGH (ref 38–126)
Anion gap: 8 (ref 5–15)
BUN: 46 mg/dL — ABNORMAL HIGH (ref 6–20)
CO2: 25 mmol/L (ref 22–32)
Calcium: 8.5 mg/dL — ABNORMAL LOW (ref 8.9–10.3)
Chloride: 102 mmol/L (ref 98–111)
Creatinine, Ser: 1.71 mg/dL — ABNORMAL HIGH (ref 0.44–1.00)
GFR calc Af Amer: 39 mL/min — ABNORMAL LOW (ref >60)
GFR calc non Af Amer: 34 mL/min — ABNORMAL LOW (ref >60)
Glucose, Bld: 343 mg/dL — ABNORMAL HIGH (ref 70–99)
Potassium: 5.8 mmol/L — ABNORMAL HIGH (ref 3.5–5.1)
Sodium: 135 mmol/L (ref 135–145)
Total Bilirubin: 0.3 mg/dL (ref 0.3–1.2)
Total Protein: 7.7 g/dL (ref 6.5–8.1)

## 2019-09-11 LAB — BRAIN NATRIURETIC PEPTIDE: B Natriuretic Peptide: 313.1 pg/mL — ABNORMAL HIGH (ref 0.0–100.0)

## 2019-09-11 LAB — HEMOGLOBIN A1C
Hgb A1c MFr Bld: 8.8 % — ABNORMAL HIGH (ref 4.8–5.6)
Mean Plasma Glucose: 205.86 mg/dL

## 2019-09-11 LAB — BASIC METABOLIC PANEL
Anion gap: 8 (ref 5–15)
BUN: 46 mg/dL — ABNORMAL HIGH (ref 6–20)
CO2: 22 mmol/L (ref 22–32)
Calcium: 8 mg/dL — ABNORMAL LOW (ref 8.9–10.3)
Chloride: 105 mmol/L (ref 98–111)
Creatinine, Ser: 1.55 mg/dL — ABNORMAL HIGH (ref 0.44–1.00)
GFR calc Af Amer: 44 mL/min — ABNORMAL LOW (ref >60)
GFR calc non Af Amer: 38 mL/min — ABNORMAL LOW (ref >60)
Glucose, Bld: 271 mg/dL — ABNORMAL HIGH (ref 70–99)
Potassium: 5.5 mmol/L — ABNORMAL HIGH (ref 3.5–5.1)
Sodium: 135 mmol/L (ref 135–145)

## 2019-09-11 LAB — HIV ANTIBODY (ROUTINE TESTING W REFLEX): HIV Screen 4th Generation wRfx: NONREACTIVE

## 2019-09-11 LAB — CBC
HCT: 34.5 % — ABNORMAL LOW (ref 36.0–46.0)
Hemoglobin: 10.5 g/dL — ABNORMAL LOW (ref 12.0–15.0)
MCH: 29.8 pg (ref 26.0–34.0)
MCHC: 30.4 g/dL (ref 30.0–36.0)
MCV: 98 fL (ref 80.0–100.0)
Platelets: 181 10*3/uL (ref 150–400)
RBC: 3.52 MIL/uL — ABNORMAL LOW (ref 3.87–5.11)
RDW: 13.5 % (ref 11.5–15.5)
WBC: 5.9 10*3/uL (ref 4.0–10.5)
nRBC: 0 % (ref 0.0–0.2)

## 2019-09-11 LAB — SARS CORONAVIRUS 2 (TAT 6-24 HRS): SARS Coronavirus 2: NEGATIVE

## 2019-09-11 LAB — POC SARS CORONAVIRUS 2 AG -  ED: SARS Coronavirus 2 Ag: NEGATIVE

## 2019-09-11 LAB — GLUCOSE, CAPILLARY
Glucose-Capillary: 142 mg/dL — ABNORMAL HIGH (ref 70–99)
Glucose-Capillary: 218 mg/dL — ABNORMAL HIGH (ref 70–99)

## 2019-09-11 MED ORDER — NICOTINE 21 MG/24HR TD PT24
21.0000 mg | MEDICATED_PATCH | Freq: Every day | TRANSDERMAL | Status: DC
  Administered 2019-09-11 – 2019-09-19 (×9): 21 mg via TRANSDERMAL
  Filled 2019-09-11 (×9): qty 1

## 2019-09-11 MED ORDER — BUPRENORPHINE 10 MCG/HR TD PTWK: 1.0000 | MEDICATED_PATCH | TRANSDERMAL | Status: DC

## 2019-09-11 MED ORDER — OXYCODONE HCL ER 10 MG PO T12A
40.0000 mg | EXTENDED_RELEASE_TABLET | Freq: Two times a day (BID) | ORAL | Status: DC
  Administered 2019-09-11 – 2019-09-19 (×15): 40 mg via ORAL
  Filled 2019-09-11 (×17): qty 4

## 2019-09-11 MED ORDER — ALLOPURINOL 300 MG PO TABS: 300.0000 mg | ORAL_TABLET | Freq: Every day | ORAL | Status: DC

## 2019-09-11 MED ORDER — SODIUM CHLORIDE 0.9 % IV SOLN: 250.0000 mL | INTRAVENOUS | Status: DC | PRN

## 2019-09-11 MED ORDER — FUROSEMIDE 10 MG/ML IJ SOLN: 40.0000 mg | Freq: Every day | INTRAMUSCULAR | Status: DC

## 2019-09-11 MED ORDER — FUROSEMIDE 10 MG/ML IJ SOLN
60.0000 mg | Freq: Two times a day (BID) | INTRAMUSCULAR | Status: DC
  Administered 2019-09-11 – 2019-09-13 (×4): 60 mg via INTRAVENOUS
  Filled 2019-09-11 (×4): qty 6

## 2019-09-11 MED ORDER — ALBUTEROL SULFATE (2.5 MG/3ML) 0.083% IN NEBU: 2.5000 mg | INHALATION_SOLUTION | RESPIRATORY_TRACT | Status: DC | PRN

## 2019-09-11 MED ORDER — BUPRENORPHINE 20 MCG/HR TD PTWK
1.0000 | MEDICATED_PATCH | TRANSDERMAL | Status: DC
  Administered 2019-09-17: 1 via TRANSDERMAL

## 2019-09-11 MED ORDER — METOPROLOL SUCCINATE ER 25 MG PO TB24
37.5000 mg | ORAL_TABLET | Freq: Every day | ORAL | Status: DC
  Administered 2019-09-11 – 2019-09-19 (×9): 37.5 mg via ORAL
  Filled 2019-09-11 (×8): qty 2

## 2019-09-11 MED ORDER — INSULIN ASPART 100 UNIT/ML ~~LOC~~ SOLN
0.0000 [IU] | Freq: Three times a day (TID) | SUBCUTANEOUS | Status: DC
  Administered 2019-09-11 (×2): 2 [IU] via SUBCUTANEOUS
  Administered 2019-09-11: 11 [IU] via SUBCUTANEOUS
  Administered 2019-09-12 – 2019-09-13 (×4): 3 [IU] via SUBCUTANEOUS
  Administered 2019-09-13: 2 [IU] via SUBCUTANEOUS
  Administered 2019-09-13: 5 [IU] via SUBCUTANEOUS
  Administered 2019-09-14: 11:00:00 3 [IU] via SUBCUTANEOUS
  Administered 2019-09-14: 2 [IU] via SUBCUTANEOUS
  Administered 2019-09-14: 17:00:00 5 [IU] via SUBCUTANEOUS
  Administered 2019-09-15: 07:00:00 2 [IU] via SUBCUTANEOUS
  Administered 2019-09-15: 17:00:00 5 [IU] via SUBCUTANEOUS
  Administered 2019-09-15 – 2019-09-16 (×3): 3 [IU] via SUBCUTANEOUS
  Administered 2019-09-16 – 2019-09-17 (×2): 2 [IU] via SUBCUTANEOUS
  Administered 2019-09-17 – 2019-09-18 (×4): 3 [IU] via SUBCUTANEOUS
  Administered 2019-09-18: 2 [IU] via SUBCUTANEOUS
  Administered 2019-09-19 (×2): 3 [IU] via SUBCUTANEOUS

## 2019-09-11 MED ORDER — OXYCODONE-ACETAMINOPHEN 5-325 MG PO TABS
1.0000 | ORAL_TABLET | Freq: Three times a day (TID) | ORAL | Status: DC | PRN
  Administered 2019-09-12 – 2019-09-18 (×8): 1 via ORAL
  Filled 2019-09-11 (×8): qty 1

## 2019-09-11 MED ORDER — SODIUM CHLORIDE 0.9% FLUSH
3.0000 mL | INTRAVENOUS | Status: DC | PRN
  Administered 2019-09-12 (×4): 3 mL via INTRAVENOUS

## 2019-09-11 MED ORDER — SODIUM CHLORIDE 0.9% FLUSH
3.0000 mL | Freq: Two times a day (BID) | INTRAVENOUS | Status: DC
  Administered 2019-09-11 – 2019-09-19 (×15): 3 mL via INTRAVENOUS

## 2019-09-11 MED ORDER — ENOXAPARIN SODIUM 40 MG/0.4ML ~~LOC~~ SOLN
40.0000 mg | SUBCUTANEOUS | Status: DC
  Administered 2019-09-11 – 2019-09-12 (×2): 40 mg via SUBCUTANEOUS
  Filled 2019-09-11 (×2): qty 0.4

## 2019-09-11 MED ORDER — ALPRAZOLAM 0.25 MG PO TABS: 0.2500 mg | ORAL_TABLET | Freq: Two times a day (BID) | ORAL | Status: DC | PRN

## 2019-09-11 MED ORDER — ONDANSETRON HCL 4 MG/2ML IJ SOLN: 4.0000 mg | Freq: Four times a day (QID) | INTRAMUSCULAR | Status: DC | PRN

## 2019-09-11 MED ORDER — FUROSEMIDE 10 MG/ML IJ SOLN
40.0000 mg | INTRAMUSCULAR | Status: AC
  Administered 2019-09-11: 06:00:00 40 mg via INTRAVENOUS
  Filled 2019-09-11: qty 4

## 2019-09-11 MED ORDER — ACETAMINOPHEN 325 MG PO TABS: 650.0000 mg | ORAL_TABLET | ORAL | Status: DC | PRN

## 2019-09-11 MED ORDER — ALLOPURINOL 300 MG PO TABS
300.0000 mg | ORAL_TABLET | Freq: Every day | ORAL | Status: DC
  Administered 2019-09-11 – 2019-09-19 (×9): 300 mg via ORAL
  Filled 2019-09-11 (×9): qty 1

## 2019-09-11 MED ORDER — FUROSEMIDE 10 MG/ML IJ SOLN: 40.0000 mg | Freq: Two times a day (BID) | INTRAMUSCULAR | Status: DC

## 2019-09-11 MED ORDER — GABAPENTIN 100 MG PO CAPS
100.0000 mg | ORAL_CAPSULE | Freq: Three times a day (TID) | ORAL | Status: DC
  Administered 2019-09-11 – 2019-09-19 (×26): 100 mg via ORAL
  Filled 2019-09-11 (×26): qty 1

## 2019-09-11 MED ORDER — INSULIN ASPART 100 UNIT/ML ~~LOC~~ SOLN
0.0000 [IU] | Freq: Every day | SUBCUTANEOUS | Status: DC
  Administered 2019-09-11 – 2019-09-15 (×2): 2 [IU] via SUBCUTANEOUS

## 2019-09-11 MED ORDER — OXYCODONE-ACETAMINOPHEN 10-325 MG PO TABS: 1.0000 | ORAL_TABLET | Freq: Three times a day (TID) | ORAL | Status: DC | PRN

## 2019-09-11 MED ORDER — INSULIN GLARGINE 100 UNIT/ML ~~LOC~~ SOLN: 30.0000 [IU] | Freq: Every day | SUBCUTANEOUS | Status: DC

## 2019-09-11 MED ORDER — OXYCODONE HCL 5 MG PO TABS
5.0000 mg | ORAL_TABLET | Freq: Three times a day (TID) | ORAL | Status: DC | PRN
  Administered 2019-09-12 – 2019-09-16 (×4): 5 mg via ORAL
  Filled 2019-09-11 (×4): qty 1

## 2019-09-11 NOTE — ED Notes (Signed)
The pt reports that she has sleep apnea

## 2019-09-11 NOTE — ED Provider Notes (Signed)
[facility] EMERGENCY DEPARTMENT Provider Note   CSN: [id number] Arrival date & time: 09/10/19  2305     History Chief Complaint  Patient presents with  . Shortness of Breath    [name] is a 53 y.o. female.  Patient presents to the emergency department with a chief complaint of shortness of breath.  She states that she has had gradually worsening shortness of breath over the past week.  She states that she has been reluctant to come to the hospital because of the pandemic.  She states that she has worsening shortness of breath with exertion.  She has a history of CHF.  She has been taking Lasix as directed, but states that does not work.  She reports significant swelling to her extremities into her abdomen.  She denies being in any pain.  She denies any fever, chills, or productive cough.  Her blood pressure in triage she was noted to have low O2 saturations dropped into the 80s with exertion.  The history is provided by the patient. No language interpreter was used.       Past Medical History:  Diagnosis Date  . Arthritis    "hands, arms, feet, back, knees" (02/17/2018)  . Cardiomyopathy [facility])    From Dr. Bonney Roussel office notes from 01/13/18  . CHF (congestive heart failure) ([place])   . Chronic lower back pain   . Diabetic neuropathy ([place])   . Family history of adverse reaction to anesthesia    Mother is hard to arouse and blood pressure drops  . GERD (gastroesophageal reflux disease)   . High cholesterol   . Hypertension   . Sleep apnea    "need new machine"  - does not use a cpap (02/17/2018)  . Type II diabetes mellitus ([place])    no longer on medications (02/17/2018)    Patient Active Problem List   Diagnosis Date Noted  . No-show for appointment 07/08/2019  . Chronic systolic heart failure ([place]) 04/26/2019  . S/P revision of total knee, right 05/14/2018  . Dislocated knee, right, sequela   . S/P revision of total knee 02/17/2018  . Failed  total right knee replacement ([place])   . Chronic renal insufficiency, stage 2 (mild) 11/10/2017  . Acute CHF (congestive heart failure) ([place]) 11/10/2017  . Failed total knee arthroplasty, sequela 08/31/2017  . Lower extremity cellulitis 01/03/2013  . Diabetes mellitus ([place]) 01/03/2013  . Morbid obesity ([place]) 11/12/2006  . TOBACCO DEPENDENCE 11/12/2006  . DEPRESSIVE DISORDER, NOS 11/12/2006  . Essential hypertension 11/12/2006  . GASTROESOPHAGEAL REFLUX, NO ESOPHAGITIS 11/12/2006  . ACNE 11/12/2006  . OSTEOARTHRITIS, LOWER LEG 11/12/2006  . APNEA, SLEEP 11/12/2006    Past Surgical History:  Procedure Laterality Date  . ANTERIOR CERVICAL DECOMP/DISCECTOMY FUSION  03/06/2010   C4-7 w/corpectomy/notes 03/17/2010  . BACK SURGERY    . CARPAL TUNNEL RELEASE Right   . HARDWARE REVISION  04/10/2010   cervical hardware revision screw replacement C4/notes 04/12/2010  . JOINT REPLACEMENT    . LAPAROSCOPIC CHOLECYSTECTOMY  1999  . LUMBAR LAMINECTOMY/DECOMPRESSION MICRODISCECTOMY  09/2010   Archie Endo 10/05/2010  . MULTIPLE EXTRACTIONS WITH ALVEOLOPLASTY [name] 01/19/2015   Procedure: MULTIPLE EXTRACTIONS Three, Four, Five, Seven, eight, nine, ten, eleven, twelve, fourteen, eighteen, twenty-one, twenty-eight, twenty-nine, thirty-one with Alveoloplasty.  ;  Surgeon: Diona Browner, DDS;  Location: [place];  Service: Oral Surgery;  Laterality: [name];  . REPLACEMENT TOTAL KNEE BILATERAL Bilateral 10/2007-03/2008   "left-right"  . TOENAIL EXCISION Bilateral    great  toes  . TONSILLECTOMY    . TOTAL KNEE REVISION Right 02/17/2018  . TOTAL KNEE REVISION Right 02/17/2018   Procedure: RIGHT TOTAL KNEE REVISION;  Surgeon: Newt Minion, MD;  Location: [place];  Service: Orthopedics;  Laterality: Right;  . TOTAL KNEE REVISION Right 05/14/2018   Procedure: RIGHT TOTAL KNEE REVISION TO HINGED KNEE;  Surgeon: Newt Minion, MD;  Location: [place];  Service: Orthopedics;  Laterality: Right;     OB History   No obstetric history  on file.     Family History  Problem Relation Age of Onset  . Kidney disease Mother   . Congestive Heart Failure Mother   . Hypertension Father     Social History   Tobacco Use  . Smoking status: Current Every Day Smoker    Packs/day: 0.50    Years: 32.00    Pack years: 16.00    Types: Cigarettes  . Smokeless tobacco: Never Used  Substance Use Topics  . Alcohol use: Not Currently  . Drug use: Not Currently    Types: Marijuana    Home Medications Prior to Admission medications   Medication Sig Start Date End Date Taking? Authorizing Provider  allopurinol (ZYLOPRIM) 300 MG tablet Take 1 tablet by mouth daily. 02/03/19   [provider]  ALPRAZolam Duanne Moron) 0.25 MG tablet Take 1 tablet by mouth 2 (two) times daily as needed. 02/03/19   [provider]  buprenorphine Haze Rushing) 10 MCG/HR [place] patch Apply one patch every 72 hours 04/08/19   [provider]  furosemide (LASIX) 40 MG tablet Take 1 tablet (40 mg total) by mouth daily. Patient taking differently: Take 20 mg by mouth daily.  11/14/17   Velvet Bathe, MD  metFORMIN (GLUCOPHAGE) 850 MG tablet Take 1 tablet by mouth 2 (two) times daily. 03/04/19   [provider]  metoprolol succinate (TOPROL-XL) 25 MG 24 hr tablet Take 37.5 mg by mouth daily.    [provider]  NARCAN 4 MG/0.1ML LIQD nasal spray kit USE AS DIRECTED AS NEEDED 12/28/18   [provider]  oxyCODONE-acetaminophen (PERCOCET) 10-325 MG tablet Take 1 tablet by mouth every 8 (eight) hours as needed for pain. Patient taking differently: Take 1 tablet by mouth 3 (three) times daily as needed for pain.  09/30/18   Rayburn, Neta Mends, PA-C  sacubitril-valsartan (ENTRESTO) 49-51 MG Take 1 tablet by mouth 2 (two) times daily. 06/08/19   Patwardhan, Reynold Bowen, MD  triamcinolone ointment (KENALOG) 0.5 % APPLY 1 APPLICATION TOPICALLY twice daily 04/06/19   [provider]  XTAMPZA ER 27 MG C12A Take 1 capsule by  mouth 2 (two) times daily. 04/07/19   [provider]    Allergies    Patient has no known allergies.  Review of Systems   Review of Systems  All other systems reviewed and are negative.   Physical Exam Updated Vital Signs BP (!) 151/86 (BP Location: Right Arm)   Pulse 92   Temp 97.9 F (36.6 C)   Resp 16   LMP 11/09/2012   SpO2 96%   Physical Exam Vitals and nursing note reviewed.  Constitutional:      General: She is not in acute distress.    Appearance: She is well-developed.  HENT:     Head: Normocephalic and atraumatic.  Eyes:     Conjunctiva/sclera: Conjunctivae normal.  Cardiovascular:     Rate and Rhythm: Normal rate and regular rhythm.     Heart sounds: No murmur.  Pulmonary:  Effort: Pulmonary effort is normal. No respiratory distress.     Breath sounds: Rales present.  Abdominal:     Palpations: Abdomen is soft.     Tenderness: There is no abdominal tenderness.     Comments: Swelling/edema  Musculoskeletal:        General: Normal range of motion.     Cervical back: Neck supple.     Comments: Large amount of edema in bilateral lower  Skin:    General: Skin is warm and dry.  Neurological:     Mental Status: She is alert and oriented to person, place, and time.  Psychiatric:        Mood and Affect: Mood normal.        Behavior: Behavior normal.     ED Results / Procedures / Treatments   Labs (all labs ordered are listed, but only abnormal results are displayed) Labs Reviewed  BRAIN NATRIURETIC PEPTIDE - Abnormal; Notable for the following components:      Result Value   B Natriuretic Peptide 313.1 (*)    All other components within normal limits  CBC WITH DIFFERENTIAL/PLATELET - Abnormal; Notable for the following components:   Hemoglobin 11.8 (*)    All other components within normal limits  COMPREHENSIVE METABOLIC PANEL - Abnormal; Notable for the following components:   Potassium 5.8 (*)    Glucose, Bld 343 (*)    BUN 46 (*)      Creatinine, Ser 1.71 (*)    Calcium 8.5 (*)    Albumin 3.1 (*)    Alkaline Phosphatase 150 (*)    GFR calc non Af Amer 34 (*)    GFR calc Af Amer 39 (*)    All other components within normal limits  SARS CORONAVIRUS 2 (TAT 6-24 HRS)  POC SARS CORONAVIRUS 2 AG -  ED    EKG None  Radiology DG Chest 2 View  Result Date: 09/11/2019 CLINICAL DATA:  Shortness of breath, low oxygen saturations, increased lower extremity swelling EXAM: CHEST - 2 VIEW COMPARISON:  Radiograph 03/01/2019 FINDINGS: Lung volumes are low. Overall increased attenuation likely secondary to body habitus. There are hazy interstitial opacities within the lungs with some cephalized, indistinctness of the pulmonary vascularity. Cardiomegaly is present, similar to prior. No pneumothorax. No visible effusion. No acute osseous or soft tissue abnormality. Degenerative changes are present in the imaged spine and shoulders. Cervical fusion hardware is visualized but incompletely evaluated on non dedicated radiograph. IMPRESSION: 1. Low lung volumes with hazy interstitial opacities within the lungs and cardiomegaly compatible with mild to moderate pulmonary edema. Electronically Signed   By: Lovena Le M.D.   On: 09/11/2019 00:10    Procedures Procedures (including critical care time)  Medications Ordered in ED Medications  furosemide (LASIX) injection 40 mg (has no administration in time range)    ED Course  I have reviewed the triage vital signs and the nursing notes.  Pertinent labs & imaging results that were available during my care of the patient were reviewed by me and considered in my medical decision making (see chart for details).    MDM Rules/Calculators/A&P                       Patient with shortness of breath.  History of CHF.  Requiring supplemental O2.  She is very tachypneic.  She has significant bilateral lower extremity edema up to her abdomen.  She will need admission.  We will start IV Lasix.  Potassium is 5.8.  Creatinine is 1.71.  GFR is 39.  Patient will need admission due to O2 sat desaturating with exertion, and very significant edema.  I doubt Covid.  Doubt infectious etiology.  She is afebrile, does not have a leukocytosis.  Appreciate Dr. Alcario Drought for admitting the patient.   Final Clinical Impression(s) / ED Diagnoses Final diagnoses:  Acute on chronic congestive heart failure, unspecified heart failure type [facility])    Rx / DC Orders ED Discharge Orders    None       Montine Circle, PA-C 09/11/19 1638    Varney Biles, MD 09/11/19 [phone]

## 2019-09-11 NOTE — Progress Notes (Addendum)
This is a 53 year old female with past medical history of heart failure reduced ejection fraction with EF 31%, type 2 diabetes, hypertension, tobacco use, chronic pain syndrome who was admitted earlier this morning by Dr. Alcario Drought after presenting to the ED with gradual onset and persistent/worsening shortness of breath x1 week most notably on exertion.  She reports this happens occasionally.  Takes Lasix at home but has not been working.  O2 sats in 80s with exertion in ED.  She also admitted to abdominal bloating this a.m. and significant leg swelling which both have improved after diuresis overnight.  In the ED, chest x-ray showed pulmonary edema, elevated BNP 313, creatinine 1.7 up from 1.4 several months ago.  K5.8, Covid negative.  She was given 40 mg Lasix IV x1 in the ED.  This a.m. patient reported she had significant improvement after receiving Lasix but still had swelling in her lower extremities and lower abdomen.  Admitted to some improved shortness of breath at rest.  General: AAOx3 Heart: Sinus rhythm on telemetry Lungs: No acute respiratory distress however with some conversational dyspnea Abdomen: Lower abdominal pitting edema Lower extremities: 3-4+ pitting edema lower extremities up to lower abdomen  Assessment/plan 1. Dyspnea secondary to acute on chronic systolic heart failure 1. Covid negative 2. Increase Lasix to 60 mg IV twice daily 3. BMP daily 4. BNP 5. Strict I's/O and daily weights 6. Telemetry 7. Follow-up echo 8. Continue beta-blocker 2. AKI, suspect cardiorenal 1. Improved with diuresis 2. Continue to hold home Entresto with hyperkalemia and AKI 3. Type 2 diabetes 1. HA1C 8.8 2. Continue sliding scale 3. Heart healthy/carb controlled diet 4. Hypertension 1. Continue beta-blocker 2. Hold Entresto 5. Chronic pain syndrome 1. Continue home meds 6. Hyperkalemia 1. Improved with Lasix, continue medical management 7. OSA  1. CPAP   Disposition: Admit to  med telemetry, continued medical management  Marva Panda, DO Pager: [phone]

## 2019-09-11 NOTE — ED Notes (Signed)
The pt reports that she is hungry  And she is eating candy  shes a diabetic and her sugar is over 300 already  Difficulty controlling her urine  While she waited she had voided in her pull-up  And she was very wet

## 2019-09-11 NOTE — H&P (Signed)
History and Physical    [name] GQQ:[id number] DOB: 1966-03-07 DOA: 09/10/2019  PCP: Vonna Drafts, FNP  Patient coming from: Home  I have personally briefly reviewed patient's old medical records in [place]  Chief Complaint: SOB  HPI: [name] is a 53 y.o. female with medical history significant of HFrEF with prior EF 31%, DM2, HTN, chronic pain syndrome.  Patient presents to the ED with gradual onset, persistent, and worsening SOB.  Symptoms onset over about the past week.  She states that she has worsening shortness of breath with exertion.  Taking lasix as directed but not working.  She reports significant swelling to her extremities into her abdomen.  She denies being in any pain.  She denies any fever, chills, or productive cough.  O2 sat in the 80s with exertion.   ED Course: CXR pulm edema, BNP 313, creat 1.7 up from 1.4 in May and June.  K 5.8.  Patient with a rather impressive degree of peripheral edema as well on exam.  COVID pending.  Given 23m lasix IV x1 in ED.   Review of Systems: As per HPI, otherwise all review of systems negative.  Past Medical History:  Diagnosis Date  . Arthritis    "hands, arms, feet, back, knees" (02/17/2018)  . Cardiomyopathy ([facility]    From Dr. PBonney Rousseloffice notes from 01/13/18  . CHF (congestive heart failure) ([facility]   . Chronic lower back pain   . Diabetic neuropathy ([facility]   . Family history of adverse reaction to anesthesia    Mother is hard to arouse and blood pressure drops  . GERD (gastroesophageal reflux disease)   . High cholesterol   . Hypertension   . Sleep apnea    "need new machine"  - does not use a cpap (02/17/2018)  . Type II diabetes mellitus ([facility]    no longer on medications (02/17/2018)    Past Surgical History:  Procedure Laterality Date  . ANTERIOR CERVICAL DECOMP/DISCECTOMY FUSION  03/06/2010   C4-7 w/corpectomy/notes 03/17/2010  . BACK SURGERY    . CARPAL TUNNEL RELEASE  Right   . HARDWARE REVISION  04/10/2010   cervical hardware revision screw replacement C4/notes 04/12/2010  . JOINT REPLACEMENT    . LAPAROSCOPIC CHOLECYSTECTOMY  1999  . LUMBAR LAMINECTOMY/DECOMPRESSION MICRODISCECTOMY  09/2010   /Archie Endo1/21/2012  . MULTIPLE EXTRACTIONS WITH ALVEOLOPLASTY [name] 01/19/2015   Procedure: MULTIPLE EXTRACTIONS Three, Four, Five, Seven, eight, nine, ten, eleven, twelve, fourteen, eighteen, twenty-one, twenty-eight, twenty-nine, thirty-one with Alveoloplasty.  ;  Surgeon: SDiona Browner DDS;  [place] [facility]  Service: Oral Surgery;  Laterality: [name];  . REPLACEMENT TOTAL KNEE BILATERAL Bilateral 10/2007-03/2008   "left-right"  . TOENAIL EXCISION Bilateral    great toes  . TONSILLECTOMY    . TOTAL KNEE REVISION Right 02/17/2018  . TOTAL KNEE REVISION Right 02/17/2018   Procedure: RIGHT TOTAL KNEE REVISION;  Surgeon: DNewt Minion MD;  [place]  Service: Orthopedics;  Laterality: Right;  . TOTAL KNEE REVISION Right 05/14/2018   Procedure: RIGHT TOTAL KNEE REVISION TO HINGED KNEE;  Surgeon: DNewt Minion MD;  [place]: [facility]  Service: Orthopedics;  Laterality: Right;     reports that she has been smoking cigarettes. She has a 16.00 pack-year smoking history. She has never used smokeless tobacco. She reports previous alcohol use. She reports previous drug use. Drug: Marijuana.  No Known Allergies  Family History  Problem Relation Age of Onset  . Kidney disease  Mother   . Congestive Heart Failure Mother   . Hypertension Father      Prior to Admission medications   Medication Sig Start Date End Date Taking? Authorizing Provider  albuterol (PROVENTIL) (2.5 MG/3ML) 0.083% nebulizer solution Take 2.5 mg by nebulization every 4 (four) hours as needed for wheezing.  08/22/19  Yes [provider]  allopurinol (ZYLOPRIM) 300 MG tablet Take 300 mg by mouth daily.  02/03/19  Yes [provider]  ALPRAZolam (XANAX) 0.25 MG tablet Take 0.25 mg by  mouth 2 (two) times daily as needed for anxiety.  02/03/19  Yes [provider]  buprenorphine (BUTRANS) 20 MCG/HR PTWK patch Place 1 patch onto the skin once a week. 08/23/19  Yes [provider]  furosemide (LASIX) 40 MG tablet Take 1 tablet (40 mg total) by mouth daily. 11/14/17  Yes Velvet Bathe, MD  gabapentin (NEURONTIN) 100 MG capsule Take 100 mg by mouth 3 (three) times daily. 08/22/19  Yes [provider]  metFORMIN (GLUCOPHAGE) 850 MG tablet Take 850 mg by mouth 2 (two) times daily.  03/04/19  Yes [provider]  metoprolol succinate (TOPROL-XL) 25 MG 24 hr tablet Take 37.5 mg by mouth daily.   Yes [provider]  NARCAN 4 MG/0.1ML LIQD nasal spray kit Place 0.4 mg into the nose as needed (for overdose).  12/28/18  Yes [provider]  oxyCODONE-acetaminophen (PERCOCET) 10-325 MG tablet Take 1 tablet by mouth every 8 (eight) hours as needed for pain. Patient taking differently: Take 1 tablet by mouth 3 (three) times daily as needed for pain.  09/30/18  Yes Rayburn, Neta Mends, PA-C  sacubitril-valsartan (ENTRESTO) 49-51 MG Take 1 tablet by mouth 2 (two) times daily. 06/08/19  Yes Patwardhan, Reynold Bowen, MD  Vitamin D, Ergocalciferol, (DRISDOL) 1.25 MG (50000 UT) CAPS capsule Take 50,000 Units by mouth every Sunday. 07/26/19  Yes [provider]  XTAMPZA ER 36 MG C12A Take 36 mg by mouth 2 (two) times daily.  08/25/19  Yes [provider]    Physical Exam: Vitals:   09/10/19 2336 09/11/19 0223 09/11/19 0342 09/11/19 0400  BP: (!) 177/118 (!) 162/78 (!) 151/86   Pulse: (!) 111 87 92   Resp: 20 18 16    Temp: 98.3 F (36.8 C)  97.9 F (36.6 C)   TempSrc: Oral     SpO2: 98% 92% 96%   Weight:    (!) 156.9 kg  Height:    5' 3"  (1.6 m)    Constitutional: NAD, calm, comfortable Eyes: PERRL, lids and conjunctivae normal ENMT: Mucous membranes are moist. Posterior pharynx clear of any exudate or lesions.Normal  dentition.  Neck: normal, supple, no masses, no thyromegaly Respiratory: clear to auscultation bilaterally, no wheezing, no crackles. Normal respiratory effort. No accessory muscle use.  Cardiovascular: Regular rate and rhythm, no murmurs / rubs / gallops. 4+ pitting edema / anasarca. 2+ pedal pulses. No carotid bruits.  Abdomen: no tenderness, no masses palpated. No hepatosplenomegaly. Bowel sounds positive.  Musculoskeletal: no clubbing / cyanosis. No joint deformity upper and lower extremities. Good ROM, no contractures. Normal muscle tone.  Skin: no rashes, lesions, ulcers. No induration Neurologic: CN 2-12 grossly intact. Sensation intact, DTR normal. Strength 5/5 in all 4.  Psychiatric: Normal judgment and insight. Alert and oriented x 3. Normal mood.    Labs on Admission: I have personally reviewed following labs and imaging studies  CBC: Recent Labs  Lab 09/10/19 2341  WBC 6.2  NEUTROABS 4.4  HGB 11.8*  HCT 38.4  MCV 99.0  PLT 193   Basic Metabolic Panel: Recent Labs  Lab 09/10/19 2341  NA 135  K 5.8*  CL 102  CO2 25  GLUCOSE 343*  BUN 46*  CREATININE 1.71*  CALCIUM 8.5*   GFR: Estimated Creatinine Clearance: 57.9 mL/min (A) (by C-G formula based on SCr of 1.71 mg/dL (H)). Liver Function Tests: Recent Labs  Lab 09/10/19 2341  AST 26  ALT 18  ALKPHOS 150*  BILITOT 0.3  PROT 7.7  ALBUMIN 3.1*   No results for input(s): LIPASE, AMYLASE in the last 168 hours. No results for input(s): AMMONIA in the last 168 hours. Coagulation Profile: No results for input(s): INR, PROTIME in the last 168 hours. Cardiac Enzymes: No results for input(s): CKTOTAL, CKMB, CKMBINDEX, TROPONINI in the last 168 hours. BNP (last 3 results) No results for input(s): PROBNP in the last 8760 hours. HbA1C: No results for input(s): HGBA1C in the last 72 hours. CBG: No results for input(s): GLUCAP in the last 168 hours. Lipid Profile: No results for input(s): CHOL, HDL, LDLCALC,  TRIG, CHOLHDL, LDLDIRECT in the last 72 hours. Thyroid Function Tests: No results for input(s): TSH, T4TOTAL, FREET4, T3FREE, THYROIDAB in the last 72 hours. Anemia Panel: No results for input(s): VITAMINB12, FOLATE, FERRITIN, TIBC, IRON, RETICCTPCT in the last 72 hours. Urine analysis:    Component Value Date/Time   COLORURINE YELLOW 12/31/2018 [place] 12/31/2018 0457   LABSPEC 1.009 12/31/2018 0457   PHURINE 5.0 12/31/2018 0457   GLUCOSEU >=500 (A) 12/31/2018 0457   HGBUR NEGATIVE 12/31/2018 [place] 12/31/2018 0457   KETONESUR NEGATIVE 12/31/2018 0457   PROTEINUR NEGATIVE 12/31/2018 0457   UROBILINOGEN 1.0 05/16/2013 1758   NITRITE POSITIVE (A) 12/31/2018 0457   LEUKOCYTESUR TRACE (A) 12/31/2018 0457    Radiological Exams on Admission: DG Chest 2 View  Result Date: 09/11/2019 CLINICAL DATA:  Shortness of breath, low oxygen saturations, increased lower extremity swelling EXAM: CHEST - 2 VIEW COMPARISON:  Radiograph 03/01/2019 FINDINGS: Lung volumes are low. Overall increased attenuation likely secondary to body habitus. There are hazy interstitial opacities within the lungs with some cephalized, indistinctness of the pulmonary vascularity. Cardiomegaly is present, similar to prior. No pneumothorax. No visible effusion. No acute osseous or soft tissue abnormality. Degenerative changes are present in the imaged spine and shoulders. Cervical fusion hardware is visualized but incompletely evaluated on non dedicated radiograph. IMPRESSION: 1. Low lung volumes with hazy interstitial opacities within the lungs and cardiomegaly compatible with mild to moderate pulmonary edema. Electronically Signed   By: Lovena Le M.D.   On: 09/11/2019 00:10    EKG: Independently reviewed.  Assessment/Plan Principal Problem:   Acute on chronic systolic CHF (congestive heart failure) (HCC) Active Problems:   Essential hypertension   Diabetes mellitus (HCC)    Chronic renal insufficiency, stage 2 (mild)   AKI (acute kidney injury) (HCC)   Chronic pain syndrome    1. Acute on chronic systolic CHF - 1. CHF pathway 2. Lasix 95m IV BID, first dose now 3. BMP daily 4. Strict intake and output 5. Tele monitor 6. 2d echo - prior EF 31% from office notes 2. AKI - vs progression of CKD 1. Patient clearly fluid overloaded though 2. BMP daily with diuresis 3. Hold home entresto due to K of 5.8 and worsening renal function 3. DM2 - 1. Hold home hypoglycemics 2. SSI mod scale AC/HS 4. HTN - 1. Continue BB 2.  Hold entresto 5. Chronic pain syndrome - 1. Continue home meds  DVT prophylaxis: Lovenox Code Status: Full Family Communication: No family in room Disposition Plan: Home after admit Consults called: None Admission status: Place in obs - though almost certainly will need to transition to inpatient given the massive volume of diuresis needed.  Expect this will take days.    Laelynn Blizzard Jerilynn Mages DO [facility]  How to contact the [facility] Attending or Consulting provider [place] or covering provider during after hours [place], for this patient?  1. Check the care team in [facility] and look for a) attending/consulting [facility] provider listed and b) the [facility] team listed 2. Log into [email or web address]  Amion Physician Scheduling and messaging for groups and whole hospitals  On call and physician scheduling software for group practices, residents, hospitalists and other medical providers for call, clinic, rotation and shift schedules. On[employer] is a hospital-wide system for scheduling doctors and paging doctors on call. EasyPlot is for scientific plotting and data analysis.  [email or web address]  and use [place]'s universal password to access. If you do not have the password, please contact the hospital operator.  3. Locate the [facility] [employer] provider you are looking for under [facility] and page to a number that you can be directly reached. 4. If you still  have difficulty reaching the provider, please page the [facility] (Director on Call) for the Hospitalists listed on amion for assistance.  09/11/2019, 5:09 AM

## 2019-09-11 NOTE — Progress Notes (Signed)
Patient states that she does not wear CPAP at night. RT will monitor as needed.

## 2019-09-12 ENCOUNTER — Inpatient Hospital Stay (HOSPITAL_COMMUNITY): Payer: Medicare Other

## 2019-09-12 DIAGNOSIS — E875 Hyperkalemia: Secondary | ICD-10-CM

## 2019-09-12 DIAGNOSIS — I5023 Acute on chronic systolic (congestive) heart failure: Secondary | ICD-10-CM

## 2019-09-12 LAB — CBC
HCT: 36.6 % (ref 36.0–46.0)
Hemoglobin: 10.9 g/dL — ABNORMAL LOW (ref 12.0–15.0)
MCH: 29.3 pg (ref 26.0–34.0)
MCHC: 29.8 g/dL — ABNORMAL LOW (ref 30.0–36.0)
MCV: 98.4 fL (ref 80.0–100.0)
Platelets: 205 10*3/uL (ref 150–400)
RBC: 3.72 MIL/uL — ABNORMAL LOW (ref 3.87–5.11)
RDW: 13.4 % (ref 11.5–15.5)
WBC: 6.1 10*3/uL (ref 4.0–10.5)
nRBC: 0 % (ref 0.0–0.2)

## 2019-09-12 LAB — BASIC METABOLIC PANEL
Anion gap: 7 (ref 5–15)
Anion gap: 5 (ref 5–15)
BUN: 49 mg/dL — ABNORMAL HIGH (ref 6–20)
BUN: 50 mg/dL — ABNORMAL HIGH (ref 6–20)
CO2: 28 mmol/L (ref 22–32)
CO2: 28 mmol/L (ref 22–32)
Calcium: 8.4 mg/dL — ABNORMAL LOW (ref 8.9–10.3)
Calcium: 8.4 mg/dL — ABNORMAL LOW (ref 8.9–10.3)
Chloride: 102 mmol/L (ref 98–111)
Chloride: 102 mmol/L (ref 98–111)
Creatinine, Ser: 1.72 mg/dL — ABNORMAL HIGH (ref 0.44–1.00)
Creatinine, Ser: 1.72 mg/dL — ABNORMAL HIGH (ref 0.44–1.00)
GFR calc Af Amer: 39 mL/min — ABNORMAL LOW (ref >60)
GFR calc Af Amer: 39 mL/min — ABNORMAL LOW (ref >60)
GFR calc non Af Amer: 34 mL/min — ABNORMAL LOW (ref >60)
GFR calc non Af Amer: 34 mL/min — ABNORMAL LOW (ref >60)
Glucose, Bld: 190 mg/dL — ABNORMAL HIGH (ref 70–99)
Glucose, Bld: 192 mg/dL — ABNORMAL HIGH (ref 70–99)
Potassium: 6 mmol/L — ABNORMAL HIGH (ref 3.5–5.1)
Potassium: 5.8 mmol/L — ABNORMAL HIGH (ref 3.5–5.1)
Sodium: 137 mmol/L (ref 135–145)
Sodium: 135 mmol/L (ref 135–145)

## 2019-09-12 LAB — ECHOCARDIOGRAM COMPLETE
Height: 63 in
Weight: 7231.09 oz

## 2019-09-12 LAB — GLUCOSE, CAPILLARY
Glucose-Capillary: 177 mg/dL — ABNORMAL HIGH (ref 70–99)
Glucose-Capillary: 189 mg/dL — ABNORMAL HIGH (ref 70–99)
Glucose-Capillary: 187 mg/dL — ABNORMAL HIGH (ref 70–99)
Glucose-Capillary: 189 mg/dL — ABNORMAL HIGH (ref 70–99)

## 2019-09-12 MED ORDER — SODIUM ZIRCONIUM CYCLOSILICATE 10 G PO PACK
10.0000 g | PACK | Freq: Every day | ORAL | Status: DC
  Administered 2019-09-12 – 2019-09-14 (×3): 10 g via ORAL
  Filled 2019-09-12 (×3): qty 1

## 2019-09-12 NOTE — Progress Notes (Signed)
  Echocardiogram 2D Echocardiogram has been performed.  [name] 09/12/2019, 10:17 AM

## 2019-09-12 NOTE — Progress Notes (Signed)
PROGRESS NOTE    [name]   WJX:[id number]  DOB: 07-19-1966  DOA: 09/10/2019 PCP: Vonna Drafts, FNP   Brief Narrative:  [name] is a 53 y.o. female with medical history significant of HFrEF with prior EF 31%, DM2, HTN, chronic pain syndrome presenting with dyspnea and hypoxia with pulse ox of 80s on exertion.     Subjective: Still feels short of breath. No new complaints.     Assessment & Plan:   Principal Problem:   Acute on chronic systolic CHF (congestive heart failure)  - cont IV diuretics- she is making improvement but remains short of breath and edematous - she admits to not taking her Lasix at home -   ECHO shows an EF of 25-40%  Active Problems: Hyperkalemia - start Lokelma - not on medications to cause the hyperkalemia    Essential hypertension - cont Toprol    Diabetes mellitus - Metformin on hold- cont SSI - A1c 8.8     Chronic renal insufficiency, stage 3 - cont to follow while diuresing     Chronic pain syndrome - con Butrans patch, Neurontin, Percocet- Oxycontin substituted for Xtampza  Nicotine abuse - cont Nicoderm patch  Morbid obesity  Body mass index is 80.06 kg/m.  Gout -cont Allopurinol   Time spent in minutes: 35 DVT prophylaxis: Lovenox Code Status: Full code Family Communication:  Disposition Plan: home in 1-2 days Consultants:   none Procedures:   2D ECHO 1. Left ventricular ejection fraction, by visual estimation, is 35 to 40%. The left ventricle has moderate to severely decreased function. There is mildly increased left ventricular hypertrophy.  2. Definity contrast agent was given IV to delineate the left ventricular endocardial borders.  3. Left ventricular diastolic parameters are consistent with Grade I diastolic dysfunction (impaired relaxation).  4. The left ventricle demonstrates global hypokinesis.  5. Global right ventricle was not well visualized.The right ventricular size is not well  visualized. Right vetricular wall thickness was not assessed.  6. Left atrial size was not well visualized.  7. Right atrial size was not well visualized.  8. The mitral valve is grossly normal. Trivial mitral valve regurgitation.  9. The tricuspid valve is not well visualized. 10. The aortic valve was not well visualized. Aortic valve regurgitation is not visualized. 11. The pulmonic valve was not well visualized. Pulmonic valve regurgitation is not visualized. 12. The aortic root was not well visualized. 13. Moderately elevated pulmonary artery systolic pressure. 14. The inferior vena cava is normal in size with <50% respiratory variability, suggesting right atrial pressure of 8 mmHg. 15. The interatrial septum was not well visualized.  Antimicrobials:  Anti-infectives (From admission, onward)   None       Objective: Vitals:   09/11/19 1455 09/11/19 2134 09/12/19 0500 09/12/19 0505  BP: (!) 145/83 125/67  115/71  Pulse: 87 78  79  Resp: 18 (!) 23  (!) 22  Temp: 98.5 F (36.9 C) 98.6 F (37 C)  97.7 F (36.5 C)  TempSrc: Oral Oral  Oral  SpO2: 95% 93%  94%  Weight:   (!) 205 kg   Height:        Intake/Output Summary (Last 24 hours) at 09/12/2019 1235 Last data filed at 09/12/2019 0500 Gross per 24 hour  Intake 353 ml  Output 850 ml  Net -497 ml   Filed Weights   09/11/19 0400 09/12/19 0500  Weight: (!) 156.9 kg (!) 205 kg    Examination: General exam:  Appears comfortable  HEENT: PERRLA, oral mucosa moist, no sclera icterus or thrush Respiratory system: very poor breath sounds (due to size) Respiratory effort normal. Cardiovascular system: S1 & S2 heard, RRR.   Gastrointestinal system: Abdomen soft, non-tender, nondistended. Normal bowel sounds. Central nervous system: Alert and oriented. No focal neurological deficits. Extremities: No cyanosis, clubbing - + pitting edema Skin: No rashes or ulcers Psychiatry:  Mood & affect appropriate.     Data Reviewed:  I have personally reviewed following labs and imaging studies  CBC: Recent Labs  Lab 09/10/19 2341 09/11/19 1030 09/12/19 0511  WBC 6.2 5.9 6.1  NEUTROABS 4.4  --   --   HGB 11.8* 10.5* 10.9*  HCT 38.4 34.5* 36.6  MCV 99.0 98.0 98.4  PLT 210 181 829   Basic Metabolic Panel: Recent Labs  Lab 09/10/19 2341 09/11/19 1030 09/12/19 0511  NA 135 135 137  K 5.8* 5.5* 6.0*  CL 102 105 102  CO2 25 22 28   GLUCOSE 343* 271* 190*  BUN 46* 46* 49*  CREATININE 1.71* 1.55* 1.72*  CALCIUM 8.5* 8.0* 8.4*   GFR: Estimated Creatinine Clearance: 69.3 mL/min (A) (by C-G formula based on SCr of 1.72 mg/dL (H)). Liver Function Tests: Recent Labs  Lab 09/10/19 2341  AST 26  ALT 18  ALKPHOS 150*  BILITOT 0.3  PROT 7.7  ALBUMIN 3.1*   No results for input(s): LIPASE, AMYLASE in the last 168 hours. No results for input(s): AMMONIA in the last 168 hours. Coagulation Profile: No results for input(s): INR, PROTIME in the last 168 hours. Cardiac Enzymes: No results for input(s): CKTOTAL, CKMB, CKMBINDEX, TROPONINI in the last 168 hours. BNP (last 3 results) No results for input(s): PROBNP in the last 8760 hours. HbA1C: Recent Labs    09/11/19 1030  HGBA1C 8.8*   CBG: Recent Labs  Lab 09/11/19 1243 09/11/19 1613 09/11/19 2056 09/12/19 0606 09/12/19 1226  GLUCAP 134* 142* 218* 177* 189*   Lipid Profile: No results for input(s): CHOL, HDL, LDLCALC, TRIG, CHOLHDL, LDLDIRECT in the last 72 hours. Thyroid Function Tests: No results for input(s): TSH, T4TOTAL, FREET4, T3FREE, THYROIDAB in the last 72 hours. Anemia Panel: No results for input(s): VITAMINB12, FOLATE, FERRITIN, TIBC, IRON, RETICCTPCT in the last 72 hours. Urine analysis:    Component Value Date/Time   COLORURINE YELLOW 12/31/2018 [place] 12/31/2018 0457   LABSPEC 1.009 12/31/2018 0457   PHURINE 5.0 12/31/2018 0457   GLUCOSEU >=500 (A) 12/31/2018 0457   HGBUR NEGATIVE 12/31/2018 0457    BILIRUBINUR NEGATIVE 12/31/2018 0457   KETONESUR NEGATIVE 12/31/2018 0457   PROTEINUR NEGATIVE 12/31/2018 0457   UROBILINOGEN 1.0 05/16/2013 1758   NITRITE POSITIVE (A) 12/31/2018 0457   LEUKOCYTESUR TRACE (A) 12/31/2018 0457   Sepsis Labs: @LABRCNTIP (procalcitonin:4,lacticidven:4) ) Recent Results (from the past 240 hour(s))  SARS CORONAVIRUS 2 (TAT 6-24 HRS) Nasopharyngeal Nasopharyngeal Swab     Status: None   Collection Time: 09/11/19  5:57 AM   Specimen: Nasopharyngeal Swab  Result Value Ref Range Status   SARS Coronavirus 2 NEGATIVE NEGATIVE Final    Comment: (NOTE) SARS-CoV-2 target nucleic acids are NOT DETECTED. The SARS-CoV-2 RNA is generally detectable in upper and lower respiratory specimens during the acute phase of infection. Negative results do not preclude SARS-CoV-2 infection, do not rule out co-infections with other pathogens, and should not be used as the sole basis for treatment or other patient management decisions. Negative results must be combined with clinical observations, patient history, and  epidemiological information. The expected result is Negative. Fact Sheet for Patients: SugarRoll.be Fact Sheet for Healthcare Providers: https://www.woods-mathews.com/ This test is not yet approved or cleared by the [place] FDA and  has been authorized for detection and/or diagnosis of SARS-CoV-2 by FDA under an Emergency Use Authorization (EUA). This EUA will remain  in effect (meaning this test can be used) for the duration of the COVID-19 declaration under Section 56 4(b)(1) of the Act, 21 U.S.C. section 360bbb-3(b)(1), unless the authorization is terminated or revoked sooner. Performed at [facility], [place] [address], [place], [place] [address]          Radiology Studies: DG Chest 2 View  Result Date: 09/11/2019 CLINICAL DATA:  Shortness of breath, low oxygen saturations, increased lower extremity  swelling EXAM: CHEST - 2 VIEW COMPARISON:  Radiograph 03/01/2019 FINDINGS: Lung volumes are low. Overall increased attenuation likely secondary to body habitus. There are hazy interstitial opacities within the lungs with some cephalized, indistinctness of the pulmonary vascularity. Cardiomegaly is present, similar to prior. No pneumothorax. No visible effusion. No acute osseous or soft tissue abnormality. Degenerative changes are present in the imaged spine and shoulders. Cervical fusion hardware is visualized but incompletely evaluated on non dedicated radiograph. IMPRESSION: 1. Low lung volumes with hazy interstitial opacities within the lungs and cardiomegaly compatible with mild to moderate pulmonary edema. Electronically Signed   By: Lovena Le M.D.   On: 09/11/2019 00:10   ECHOCARDIOGRAM COMPLETE  Result Date: 09/12/2019   ECHOCARDIOGRAM REPORT   Patient Name:   KIMBERLY NIELAND Date of Exam: 09/12/2019 Medical Rec #:  [id number]        Height:       63.0 in Accession #:    [id number]       Weight:       451.9 lb Date of Birth:  12/27/1965         BSA:          2.73 m Patient Age:    73 years         BP:           115/71 mmHg Patient Gender: F                HR:           79 bpm. Exam Location:  Inpatient Procedure: 2D Echo Indications:    CHF 428  History:        Patient has no prior history of Echocardiogram examinations.                 Risk Factors:Hypertension and Diabetes.  Sonographer:    Jannett Celestine RDCS (AE) Referring Phys: [id number] JARED M GARDNER  Sonographer Comments: Technically difficult study due to poor echo windows, suboptimal parasternal window and patient is morbidly obese. Image acquisition challenging due to patient body habitus. off axis apical windows. restricted mobility IMPRESSIONS  1. Left ventricular ejection fraction, by visual estimation, is 35 to 40%. The left ventricle has moderate to severely decreased function. There is mildly increased left ventricular hypertrophy.  2.  Definity contrast agent was given IV to delineate the left ventricular endocardial borders.  3. Left ventricular diastolic parameters are consistent with Grade I diastolic dysfunction (impaired relaxation).  4. The left ventricle demonstrates global hypokinesis.  5. Global right ventricle was not well visualized.The right ventricular size is not well visualized. Right vetricular wall thickness was not assessed.  6. Left atrial size was not well visualized.  7.  Right atrial size was not well visualized.  8. The mitral valve is grossly normal. Trivial mitral valve regurgitation.  9. The tricuspid valve is not well visualized. 10. The aortic valve was not well visualized. Aortic valve regurgitation is not visualized. 11. The pulmonic valve was not well visualized. Pulmonic valve regurgitation is not visualized. 12. The aortic root was not well visualized. 13. Moderately elevated pulmonary artery systolic pressure. 14. The inferior vena cava is normal in size with <50% respiratory variability, suggesting right atrial pressure of 8 mmHg. 15. The interatrial septum was not well visualized. FINDINGS  Left Ventricle: Left ventricular ejection fraction, by visual estimation, is 35 to 40%. The left ventricle has moderate to severely decreased function. Definity contrast agent was given IV to delineate the left ventricular endocardial borders. The left ventricle demonstrates global hypokinesis. There is mildly increased left ventricular hypertrophy. Left ventricular diastolic parameters are consistent with Grade I diastolic dysfunction (impaired relaxation). Indeterminate filling pressures. Right Ventricle: The right ventricular size is not well visualized. Right vetricular wall thickness was not assessed. Global RV systolic function is was not well visualized. The tricuspid regurgitant velocity is 3.08 m/s, and with an assumed right atrial  pressure of 8 mmHg, the estimated right ventricular systolic pressure is moderately  elevated at 45.9 mmHg. Left Atrium: Left atrial size was not well visualized. Right Atrium: Right atrial size was not well visualized Pericardium: There is no evidence of pericardial effusion. Mitral Valve: The mitral valve is grossly normal. Trivial mitral valve regurgitation. Tricuspid Valve: The tricuspid valve is not well visualized. Tricuspid valve regurgitation is trivial. Aortic Valve: The aortic valve was not well visualized. Aortic valve regurgitation is not visualized. Pulmonic Valve: The pulmonic valve was not well visualized. Pulmonic valve regurgitation is not visualized. Pulmonic regurgitation is not visualized. Aorta: The aortic root was not well visualized. Venous: The inferior vena cava is normal in size with less than 50% respiratory variability, suggesting right atrial pressure of 8 mmHg. IAS/Shunts: The interatrial septum was not well visualized.   Diastology LV e' medial:   6.42 cm/s LV E/e' medial: 11.9  AORTIC VALVE LVOT Vmax:   86.80 cm/s LVOT Vmean:  56.100 cm/s LVOT VTI:    0.123 m MITRAL VALVE                       TRICUSPID VALVE MV Area (PHT): 5.54 cm            TR Peak grad:   37.9 mmHg MV PHT:        39.73 msec          TR Vmax:        308.00 cm/s MV Decel Time: 137 msec MV E velocity: 76.70 cm/s 103 cm/s SHUNTS                                    Systemic VTI: 0.12 m  Lyman Bishop MD Electronically signed by Lyman Bishop MD Signature Date/Time: 09/12/2019/12:01:52 PM    Final       Scheduled Meds: . allopurinol  300 mg Oral Daily  . [START ON 09/17/2019] buprenorphine  1 patch Transdermal Q Sat  . enoxaparin (LOVENOX) injection  40 mg Subcutaneous Q24H  . furosemide  60 mg Intravenous BID  . gabapentin  100 mg Oral TID  . insulin aspart  0-15 Units Subcutaneous TID WC  . insulin aspart  0-5 Units Subcutaneous QHS  . metoprolol succinate  37.5 mg Oral Daily  . nicotine  21 mg Transdermal Daily  . oxyCODONE  40 mg Oral BID  . sodium chloride flush  3 mL Intravenous Q12H    . sodium zirconium cyclosilicate  10 g Oral Daily   Continuous Infusions: . sodium chloride Stopped (09/11/19 0657)     LOS: 1 day      Debbe Odea, MD [facility] Pager: [email or web address] Password [facility] 09/12/2019, 12:35 PM

## 2019-09-13 LAB — BASIC METABOLIC PANEL
Anion gap: 9 (ref 5–15)
BUN: 51 mg/dL — ABNORMAL HIGH (ref 6–20)
CO2: 27 mmol/L (ref 22–32)
Calcium: 8.3 mg/dL — ABNORMAL LOW (ref 8.9–10.3)
Chloride: 101 mmol/L (ref 98–111)
Creatinine, Ser: 1.67 mg/dL — ABNORMAL HIGH (ref 0.44–1.00)
GFR calc Af Amer: 41 mL/min — ABNORMAL LOW (ref >60)
GFR calc non Af Amer: 35 mL/min — ABNORMAL LOW (ref >60)
Glucose, Bld: 156 mg/dL — ABNORMAL HIGH (ref 70–99)
Potassium: 5.5 mmol/L — ABNORMAL HIGH (ref 3.5–5.1)
Sodium: 137 mmol/L (ref 135–145)

## 2019-09-13 LAB — GLUCOSE, CAPILLARY
Glucose-Capillary: 138 mg/dL — ABNORMAL HIGH (ref 70–99)
Glucose-Capillary: 206 mg/dL — ABNORMAL HIGH (ref 70–99)
Glucose-Capillary: 173 mg/dL — ABNORMAL HIGH (ref 70–99)
Glucose-Capillary: 151 mg/dL — ABNORMAL HIGH (ref 70–99)

## 2019-09-13 LAB — HEPARIN ANTI-XA: Heparin LMW: 0.24 IU/mL

## 2019-09-13 MED ORDER — SACUBITRIL-VALSARTAN 49-51 MG PO TABS
1.0000 | ORAL_TABLET | Freq: Two times a day (BID) | ORAL | Status: DC
  Administered 2019-09-13 – 2019-09-14 (×4): 1 via ORAL
  Filled 2019-09-13 (×5): qty 1

## 2019-09-13 MED ORDER — FUROSEMIDE 10 MG/ML IJ SOLN
60.0000 mg | Freq: Three times a day (TID) | INTRAMUSCULAR | Status: DC
  Administered 2019-09-13 – 2019-09-14 (×5): 60 mg via INTRAVENOUS
  Filled 2019-09-13 (×5): qty 6

## 2019-09-13 MED ORDER — ENOXAPARIN SODIUM 100 MG/ML ~~LOC~~ SOLN
100.0000 mg | SUBCUTANEOUS | Status: DC
  Administered 2019-09-13 – 2019-09-19 (×7): 100 mg via SUBCUTANEOUS
  Filled 2019-09-13 (×6): qty 1

## 2019-09-13 NOTE — TOC Progression Note (Signed)
Transition of Care [facility]) - Progression Note    Patient Details  Name: [name] MRN: [id number] Date of Birth: 1966-03-29  Transition of Care [facility]) CM/SW [place], [place] Phone Number: 09/13/2019, 5:18 PM  Clinical Narrative:    Tiffany with [facility] able to take pt for HHPT.    Expected Discharge Plan: [place] Barriers to Discharge: Continued Medical Work up  Expected Discharge Plan and Services Expected Discharge Plan: [place] In-house Referral: Clinical Social Work Discharge Planning Services: CM Consult Post Acute Care Choice: Home Health, Durable Medical Equipment Living arrangements for the past 2 months: [place]: PT [place]: [facility] (formerly [employer]) Date [place]: 09/13/19 Time [place]: 1718 Representative spoke with at [place]: [place]   Readmission Risk Interventions Readmission Risk Prevention Plan 09/13/2019 09/13/2019  Transportation Screening Complete Complete  PCP or Specialist Appt within 5-7 Days Complete Complete  Home Care Screening Complete Complete  Medication Review (RN CM) Referral to Pharmacy Complete  Some recent data might be hidden

## 2019-09-13 NOTE — Progress Notes (Signed)
PROGRESS NOTE    [name]   NIO:[id number]  DOB: November 05, 1965  DOA: 09/10/2019 PCP: Carrie Drafts, FNP   Brief Narrative:  [name] is a 53 y.o. female with medical history significant of HFrEF with prior EF 31%, DM2, HTN, chronic pain syndrome presenting with dyspnea and hypoxia with pulse ox of 80s on exertion.  She admits to stopping her Lasix after she ran out of it about 1 month ago.    Subjective: Remains short of breath and swollen in her legs but is steadily improving.     Assessment & Plan:   Principal Problem:   Acute on chronic systolic CHF (congestive heart failure)  - cont IV diuretics- she is making improvement but remains short of breath and edematous - she admits to not taking her Lasix at home - she states her normal weight is 380 lb- currently still 204 lb- will increase lasix to TID- she is aware of fluid restriction  - cont Entresto -   ECHO shows an EF of 35-40% with grade 1 dCHF - Dr Virgina Jock aware that she is here- he will arrange outpt f/u for her- he notes that she has missed a couple of appts.   Active Problems: Hyperkalemia - started Lokelma - not on medications to cause the hyperkalemia- slightly improved today from 6.0 to 5.5 - follow    Essential hypertension - cont Toprol    Diabetes mellitus - Metformin on hold- cont SSI - A1c 8.8     Chronic renal insufficiency, stage 3 - cont to follow while diuresing     Chronic pain syndrome - con Butrans patch, Neurontin, Percocet- Oxycontin substituted for Xtampza  Nicotine abuse - cont Nicoderm patch  Morbid obesity  Body mass index is 79.67 kg/m.  Gout -cont Allopurinol   Time spent in minutes: 35 DVT prophylaxis: Lovenox Code Status: Full code Family Communication:  Disposition Plan: home in 1-2 days Consultants:   none Procedures:   2D ECHO 1. Left ventricular ejection fraction, by visual estimation, is 35 to 40%. The left ventricle has moderate to  severely decreased function. There is mildly increased left ventricular hypertrophy.  2. Definity contrast agent was given IV to delineate the left ventricular endocardial borders.  3. Left ventricular diastolic parameters are consistent with Grade I diastolic dysfunction (impaired relaxation).  4. The left ventricle demonstrates global hypokinesis.  5. Global right ventricle was not well visualized.The right ventricular size is not well visualized. Right vetricular wall thickness was not assessed.  6. Left atrial size was not well visualized.  7. Right atrial size was not well visualized.  8. The mitral valve is grossly normal. Trivial mitral valve regurgitation.  9. The tricuspid valve is not well visualized. 10. The aortic valve was not well visualized. Aortic valve regurgitation is not visualized. 11. The pulmonic valve was not well visualized. Pulmonic valve regurgitation is not visualized. 12. The aortic root was not well visualized. 13. Moderately elevated pulmonary artery systolic pressure. 14. The inferior vena cava is normal in size with <50% respiratory variability, suggesting right atrial pressure of 8 mmHg. 15. The interatrial septum was not well visualized.  Antimicrobials:  Anti-infectives (From admission, onward)   None       Objective: Vitals:   09/12/19 2209 09/12/19 2304 09/13/19 0500 09/13/19 1238  BP: (!) 141/73   138/68  Pulse: 73 75  70  Resp: (!) 23 18  18   Temp: 97.8 F (36.6 C)   98.1 F (36.7  C)  TempSrc: Oral   Oral  SpO2: 96% 97%  97%  Weight: (!) 204.3 kg  (!) 204 kg   Height: 5\' 3"  (1.6 m)       Intake/Output Summary (Last 24 hours) at 09/13/2019 1547 Last data filed at 09/12/2019 2307 Gross per 24 hour  Intake 129 ml  Output 1200 ml  Net -1071 ml   Filed Weights   09/12/19 0500 09/12/19 2209 09/13/19 0500  Weight: (!) 205 kg (!) 204.3 kg (!) 204 kg    Examination: General exam: Appears comfortable  HEENT: PERRLA, oral mucosa moist,  no sclera icterus or thrush Respiratory system: difficult to hear breath sounds due to size- Respiratory effort normal. Cardiovascular system: S1 & S2 heard,  No murmurs  Gastrointestinal system: Abdomen soft, non-tender, nondistended. Normal bowel sounds   Central nervous system: Alert and oriented. No focal neurological deficits. Extremities: No cyanosis, clubbing - + pedal edema Skin: No rashes or ulcers Psychiatry:  Mood & affect appropriate.     Data Reviewed: I have personally reviewed following labs and imaging studies  CBC: Recent Labs  Lab 09/10/19 2341 09/11/19 1030 09/12/19 0511  WBC 6.2 5.9 6.1  NEUTROABS 4.4  --   --   HGB 11.8* 10.5* 10.9*  HCT 38.4 34.5* 36.6  MCV 99.0 98.0 98.4  PLT 210 181 660   Basic Metabolic Panel: Recent Labs  Lab 09/10/19 2341 09/11/19 1030 09/12/19 0511 09/12/19 1639 09/13/19 0622  NA 135 135 137 135 137  K 5.8* 5.5* 6.0* 5.8* 5.5*  CL 102 105 102 102 101  CO2 25 22 28 28 27   GLUCOSE 343* 271* 190* 192* 156*  BUN 46* 46* 49* 50* 51*  CREATININE 1.71* 1.55* 1.72* 1.72* 1.67*  CALCIUM 8.5* 8.0* 8.4* 8.4* 8.3*   GFR: Estimated Creatinine Clearance: 71.1 mL/min (A) (by C-G formula based on SCr of 1.67 mg/dL (H)). Liver Function Tests: Recent Labs  Lab 09/10/19 2341  AST 26  ALT 18  ALKPHOS 150*  BILITOT 0.3  PROT 7.7  ALBUMIN 3.1*   No results for input(s): LIPASE, AMYLASE in the last 168 hours. No results for input(s): AMMONIA in the last 168 hours. Coagulation Profile: No results for input(s): INR, PROTIME in the last 168 hours. Cardiac Enzymes: No results for input(s): CKTOTAL, CKMB, CKMBINDEX, TROPONINI in the last 168 hours. BNP (last 3 results) No results for input(s): PROBNP in the last 8760 hours. HbA1C: Recent Labs    09/11/19 1030  HGBA1C 8.8*   CBG: Recent Labs  Lab 09/12/19 1226 09/12/19 1558 09/12/19 2138 09/13/19 0646 09/13/19 1110  GLUCAP 189* 187* 189* 138* 206*   Lipid Profile: No  results for input(s): CHOL, HDL, LDLCALC, TRIG, CHOLHDL, LDLDIRECT in the last 72 hours. Thyroid Function Tests: No results for input(s): TSH, T4TOTAL, FREET4, T3FREE, THYROIDAB in the last 72 hours. Anemia Panel: No results for input(s): VITAMINB12, FOLATE, FERRITIN, TIBC, IRON, RETICCTPCT in the last 72 hours. Urine analysis:    Component Value Date/Time   COLORURINE YELLOW 12/31/2018 0457   APPEARANCEUR CLEAR 12/31/2018 0457   LABSPEC 1.009 12/31/2018 0457   PHURINE 5.0 12/31/2018 0457   GLUCOSEU >=500 (A) 12/31/2018 0457   HGBUR NEGATIVE 12/31/2018 0457   BILIRUBINUR NEGATIVE 12/31/2018 0457   KETONESUR NEGATIVE 12/31/2018 0457   PROTEINUR NEGATIVE 12/31/2018 0457   UROBILINOGEN 1.0 05/16/2013 1758   NITRITE POSITIVE (A) 12/31/2018 0457   LEUKOCYTESUR TRACE (A) 12/31/2018 0457   Sepsis Labs: @LABRCNTIP (procalcitonin:4,lacticidven:4) ) Recent Results (from the  past 240 hour(s))  SARS CORONAVIRUS 2 (TAT 6-24 HRS) Nasopharyngeal Nasopharyngeal Swab     Status: None   Collection Time: 09/11/19  5:57 AM   Specimen: Nasopharyngeal Swab  Result Value Ref Range Status   SARS Coronavirus 2 NEGATIVE NEGATIVE Final    Comment: (NOTE) SARS-CoV-2 target nucleic acids are NOT DETECTED. The SARS-CoV-2 RNA is generally detectable in upper and lower respiratory specimens during the acute phase of infection. Negative results do not preclude SARS-CoV-2 infection, do not rule out co-infections with other pathogens, and should not be used as the sole basis for treatment or other patient management decisions. Negative results must be combined with clinical observations, patient history, and epidemiological information. The expected result is Negative. Fact Sheet for Patients: SugarRoll.be Fact Sheet for Healthcare Providers: https://www.woods-mathews.com/ This test is not yet approved or cleared by the [place] FDA and  has been authorized for  detection and/or diagnosis of SARS-CoV-2 by FDA under an Emergency Use Authorization (EUA). This EUA will remain  in effect (meaning this test can be used) for the duration of the COVID-19 declaration under Section 56 4(b)(1) of the Act, 21 U.S.C. section 360bbb-3(b)(1), unless the authorization is terminated or revoked sooner. Performed at [facility], [place] [address] [place], [place] [address]          Radiology Studies: ECHOCARDIOGRAM COMPLETE  Result Date: 09/12/2019   ECHOCARDIOGRAM REPORT   Patient Name:   Ena Dawley Date of Exam: 09/12/2019 Medical Rec #:  [id number]        Height:       63.0 in Accession #:    [id number]       Weight:       451.9 lb Date of Birth:  [birth year]         BSA:          2.73 m Patient Age:    64 years         BP:           115/71 mmHg Patient Gender: F                HR:           79 bpm. Exam Location:  Inpatient Procedure: 2D Echo Indications:    CHF 428  History:        Patient has no prior history of Echocardiogram examinations.                 Risk Factors:Hypertension and Diabetes.  Sonographer:    Jannett Celestine RDCS (AE) Referring Phys: [id number] JARED M GARDNER  Sonographer Comments: Technically difficult study due to poor echo windows, suboptimal parasternal window and patient is morbidly obese. Image acquisition challenging due to patient body habitus. off axis apical windows. restricted mobility IMPRESSIONS  1. Left ventricular ejection fraction, by visual estimation, is 35 to 40%. The left ventricle has moderate to severely decreased function. There is mildly increased left ventricular hypertrophy.  2. Definity contrast agent was given IV to delineate the left ventricular endocardial borders.  3. Left ventricular diastolic parameters are consistent with Grade I diastolic dysfunction (impaired relaxation).  4. The left ventricle demonstrates global hypokinesis.  5. Global right ventricle was not well visualized.The right ventricular size is not  well visualized. Right vetricular wall thickness was not assessed.  6. Left atrial size was not well visualized.  7. Right atrial size was not well visualized.  8. The mitral valve is grossly normal. Trivial mitral valve  regurgitation.  9. The tricuspid valve is not well visualized. 10. The aortic valve was not well visualized. Aortic valve regurgitation is not visualized. 11. The pulmonic valve was not well visualized. Pulmonic valve regurgitation is not visualized. 12. The aortic root was not well visualized. 13. Moderately elevated pulmonary artery systolic pressure. 14. The inferior vena cava is normal in size with <50% respiratory variability, suggesting right atrial pressure of 8 mmHg. 15. The interatrial septum was not well visualized. FINDINGS  Left Ventricle: Left ventricular ejection fraction, by visual estimation, is 35 to 40%. The left ventricle has moderate to severely decreased function. Definity contrast agent was given IV to delineate the left ventricular endocardial borders. The left ventricle demonstrates global hypokinesis. There is mildly increased left ventricular hypertrophy. Left ventricular diastolic parameters are consistent with Grade I diastolic dysfunction (impaired relaxation). Indeterminate filling pressures. Right Ventricle: The right ventricular size is not well visualized. Right vetricular wall thickness was not assessed. Global RV systolic function is was not well visualized. The tricuspid regurgitant velocity is 3.08 m/s, and with an assumed right atrial  pressure of 8 mmHg, the estimated right ventricular systolic pressure is moderately elevated at 45.9 mmHg. Left Atrium: Left atrial size was not well visualized. Right Atrium: Right atrial size was not well visualized Pericardium: There is no evidence of pericardial effusion. Mitral Valve: The mitral valve is grossly normal. Trivial mitral valve regurgitation. Tricuspid Valve: The tricuspid valve is not well visualized. Tricuspid  valve regurgitation is trivial. Aortic Valve: The aortic valve was not well visualized. Aortic valve regurgitation is not visualized. Pulmonic Valve: The pulmonic valve was not well visualized. Pulmonic valve regurgitation is not visualized. Pulmonic regurgitation is not visualized. Aorta: The aortic root was not well visualized. Venous: The inferior vena cava is normal in size with less than 50% respiratory variability, suggesting right atrial pressure of 8 mmHg. IAS/Shunts: The interatrial septum was not well visualized.   Diastology LV e' medial:   6.42 cm/s LV E/e' medial: 11.9  AORTIC VALVE LVOT Vmax:   86.80 cm/s LVOT Vmean:  56.100 cm/s LVOT VTI:    0.123 m MITRAL VALVE                       TRICUSPID VALVE MV Area (PHT): 5.54 cm            TR Peak grad:   37.9 mmHg MV PHT:        39.73 msec          TR Vmax:        308.00 cm/s MV Decel Time: 137 msec MV E velocity: 76.70 cm/s 103 cm/s SHUNTS                                    Systemic VTI: 0.12 m  Lyman Bishop MD Electronically signed by Lyman Bishop MD Signature Date/Time: 09/12/2019/12:01:52 PM    Final       Scheduled Meds:  allopurinol  300 mg Oral Daily   [START ON 09/17/2019] buprenorphine  1 patch Transdermal Q Sat   enoxaparin (LOVENOX) injection  100 mg Subcutaneous Q24H   furosemide  60 mg Intravenous TID   gabapentin  100 mg Oral TID   insulin aspart  0-15 Units Subcutaneous TID WC   insulin aspart  0-5 Units Subcutaneous QHS   metoprolol succinate  37.5 mg Oral Daily   nicotine  21  mg Transdermal Daily   oxyCODONE  40 mg Oral BID   sacubitril-valsartan  1 tablet Oral BID   sodium chloride flush  3 mL Intravenous Q12H   sodium zirconium cyclosilicate  10 g Oral Daily   Continuous Infusions:  sodium chloride Stopped (09/11/19 0657)     LOS: 2 days      Debbe Odea, MD [facility] Pager: [email or web address] Password [facility] 09/13/2019, 3:47 PM

## 2019-09-13 NOTE — Evaluation (Signed)
Physical Therapy Evaluation Patient Details Name: [name] MRN: [id number] DOB: 07/11/1966 Today's Date: 09/13/2019   History of Present Illness  53 yo female admitted to ED on 12/26 with CHF exacerbation, with fluid retention and shortness of breath requiring supplemental O2. PMH includes HFrEF with prior EF of 31%, DMII with neuropathy, HTN, chronic pain, cardiomyopathy, bilateral TKR with revision, ACDF 2011, lumbar laminectomy.  Clinical Impression   Pt presents with generalized weakness, difficulty performing mobility tasks with increased time and effort needed to mobilize, unsteadiness in standing with history of falls, increased work of breathing with sats WFL, and decreased activity tolerance. Pt to benefit from acute PT to address deficits. Pt ambulated 2x5 ft in room with RW and min assist for steadying and guiding. Pt with poor activity tolerance at this time, recommending HHPT and maximizing HH aide time to assist pt with ADLs. Pt politely declines SNF at this time. PT to progress mobility as tolerated, and will continue to follow acutely.   SATURATION QUALIFICATIONS: (This note is used to comply with regulatory documentation for home oxygen)  Patient Saturations on Room Air at Rest = 97%  Patient Saturations on Room Air while Ambulating = 96%        Follow Up Recommendations Home health PT;Supervision for mobility/OOB    Equipment Recommendations  3in1 (PT)(bariatric)    Recommendations for Other Services       Precautions / Restrictions Precautions Precautions: Fall Restrictions Weight Bearing Restrictions: No      Mobility  Bed Mobility Overal bed mobility: Needs Assistance Bed Mobility: Supine to Sit;Sit to Supine     Supine to sit: Min assist;HOB elevated Sit to supine: Mod assist   General bed mobility comments: min assist for supine to sit for LE and trunk management, pt with very increased time to come to EOB. Mod assist for sit to supine for  LE lifting into bed, scooting pt up with use of boost function.  Transfers Overall transfer level: Needs assistance Equipment used: Rolling walker (2 wheeled) Transfers: Sit to/from Stand Sit to Stand: Min assist;From elevated surface         General transfer comment: Min assist for power up, steadying. Sit to stand x2, from bed and from bari [place] [place].  Ambulation/Gait Ambulation/Gait assistance: Min assist Gait Distance (Feet): 10 Feet(2x5 ft) Assistive device: Rolling walker (2 wheeled) Gait Pattern/deviations: Step-through pattern;Decreased stride length;Trunk flexed;Wide base of support Gait velocity: dcr   General Gait Details: min assist for steadying, guiding pt and RW to destination surface. Pt with heavy forward flexion and wide BOS secondary to body habitus and LE swelling  Stairs            Wheelchair Mobility    Modified Rankin (Stroke Patients Only)       Balance Overall balance assessment: Needs assistance;History of Falls Sitting-balance support: No upper extremity supported;Feet supported Sitting balance-Leahy Scale: Good     Standing balance support: Bilateral upper extremity supported Standing balance-Leahy Scale: Poor Standing balance comment: reliant on external support                             Pertinent Vitals/Pain Pain Assessment: No/denies pain    Home Living Family/patient expects to be discharged to:: Private residence Living Arrangements: Children Available Help at Discharge: Family;Personal care attendant Type of Home: House Home Access: Ramped entrance     Home Layout: One level Home Equipment: Environmental consultant - 2 wheels;Wheelchair - power;Other (comment)  Additional Comments: hospital bed    Prior Function Level of Independence: Needs assistance   Gait / Transfers Assistance Needed: Pt uses bari RW around her house for short distance ambulation, utilizes power wheelchair when outside of home. Pt reports no falls in the past  year.  ADL's / Homemaking Assistance Needed: Pt reports having aide assist her with dressing, bathing, cooking, cleaning, toileting. Pt has aide 3-4 hours/day, 5 days/week        Hand Dominance   Dominant Hand: Right    Extremity/Trunk Assessment   Upper Extremity Assessment Upper Extremity Assessment: Generalized weakness    Lower Extremity Assessment Lower Extremity Assessment: Generalized weakness    Cervical / Trunk Assessment Cervical / Trunk Assessment: Normal  Communication   Communication: No difficulties  Cognition Arousal/Alertness: Awake/alert Behavior During Therapy: WFL for tasks assessed/performed Overall Cognitive Status: Within Functional Limits for tasks assessed                                        General Comments General comments (skin integrity, edema, etc.): edema and warmth noted bilateral LEs, O2 saturation on RA 96% and above during mobility    Exercises     Assessment/Plan    PT Assessment Patient needs continued PT services  PT Problem List Decreased strength;Decreased mobility;Decreased activity tolerance;Decreased balance;Decreased knowledge of use of DME;Cardiopulmonary status limiting activity       PT Treatment Interventions DME instruction;Therapeutic activities;Therapeutic exercise;Patient/family education;Gait training;Balance training;Functional mobility training    PT Goals (Current goals can be found in the Care Plan section)  Acute Rehab PT Goals Patient Stated Goal: go home, walker better PT Goal Formulation: With patient Time For Goal Achievement: 09/27/19 Potential to Achieve Goals: Good    Frequency Min 3X/week   Barriers to discharge        Co-evaluation               AM-PAC PT "6 Clicks" Mobility  Outcome Measure Help needed turning from your back to your side while in a flat bed without using bedrails?: A Lot Help needed moving from lying on your back to sitting on the side of a flat  bed without using bedrails?: A Lot Help needed moving to and from a bed to a chair (including a wheelchair)?: A Lot Help needed standing up from a chair using your arms (e.g., wheelchair or bedside chair)?: A Little Help needed to walk in hospital room?: A Little Help needed climbing 3-5 steps with a railing? : A Lot 6 Click Score: 14    End of Session Equipment Utilized During Treatment: Gait belt Activity Tolerance: Patient tolerated treatment well;Patient limited by fatigue Patient left: in bed;with call bell/phone within reach;Other (comment)(with CSW in room) Nurse Communication: Mobility status;Other (comment)(sats WFL during ambulation) PT Visit Diagnosis: Difficulty in walking, not elsewhere classified (R26.2);Muscle weakness (generalized) (M62.81)    Time: 6546-5035 PT Time Calculation (min) (ACUTE ONLY): 35 min   Charges:   PT Evaluation $PT Eval Low Complexity: 1 Low PT Treatments $Gait Training: 8-22 mins       Emili Mcloughlin E, PT Acute Rehabilitation Services Pager [phone]  Office [phone]  Cathe Bilger D Kolette Vey 09/13/2019, 3:55 PM

## 2019-09-13 NOTE — TOC Initial Note (Signed)
Transition of Care [facility]) - Initial/Assessment Note    Patient Details  Name: [name] MRN: [id number] Date of Birth: [birth year]  Transition of Care [facility]) CM/SW Contact:    Alexander Mt, [place] Phone Number: 09/13/2019, 3:38 PM  Clinical Narrative:                 CSW spoke with pt at bedside. Introduced self, role, reason for visit. Pt familiar with social work team from previous admissions. She lives at home with her two daughters. Confirmed home address and PCP. Pt has PCS services through [facility]. She is agreeable to appointment to be made with PCP Wayland Salinas and referral for [facility] services as recommended by PT. She has had [facility] in the past and would like to see if they could take her back. Pt daughter Terrence Dupont is main support but her PCS services also come 4-5 hours a day. She is working on getting those hours extended.   She is agreeable to bariatric walker and requests a bariatric commode if possible. Pt states that she has [facility] transport her to appointments as needed and denies any concerns with obtaining meds but is agreeable to [place] referral while here to ensure she gets her meds prior to dc.   Expected Discharge Plan: [place] Barriers to Discharge: Continued Medical Work up   Patient Goals and CMS Choice Patient states their goals for this hospitalization and ongoing recovery are:: to go home, have some extra help CMS Medicare.gov Compare Post Acute Care list provided to:: Patient Choice offered to / list presented to : Patient  Expected Discharge Plan and Services Expected Discharge Plan: [place] In-house Referral: Clinical Social Work Discharge Planning Services: CM Consult Post Acute Care Choice: Durable Medical Equipment, Home Health Living arrangements for the past 2 months: Single Family Home  HH Arranged: PT  Prior Living Arrangements/Services Living arrangements for the past 2 months:  Single Family Home Lives with:: Adult Children Patient language and need for interpreter reviewed:: Yes(no needs) Do you feel safe going back to the place where you live?: Yes      Need for Family Participation in Patient Care: Yes (Comment)(assistance with ADL/IADLs as needed) Care giver support system in place?: Yes (comment)(adult daughter; PCS) Current home services: DME, Other (comment)(DME/PCS through [facility]) Criminal Activity/Legal Involvement Pertinent to Current Situation/Hospitalization: No - Comment as needed  Permission Sought/Granted Permission sought to share information with : Facility Sport and exercise psychologist, Family Supports Permission granted to share information with : Yes, Verbal Permission Granted  Share Information with NAME: Lucious Groves  Permission granted to share info w AGENCY: [facility]/PCP  Permission granted to share info w Relationship: daughter  Permission granted to share info w Contact Information: [phone] (daughter) [phone] ([place])  Emotional Assessment Appearance:: Appears stated age Attitude/Demeanor/Rapport: Engaged, Gracious Affect (typically observed): Accepting, Adaptable, Pleasant Orientation: : Oriented to Self, Oriented to Place, Oriented to Situation, Oriented to  Time Alcohol / Substance Use: Not Applicable Psych Involvement: No (comment)  Admission diagnosis:  Heart failure (HCC) [I50.9] Acute on chronic systolic CHF (congestive heart failure) (HCC) [I50.23] Acute on chronic congestive heart failure, unspecified heart failure type [facility]) [I50.9] Patient Active Problem List   Diagnosis Date Noted  . Acute on chronic systolic CHF (congestive heart failure) ([place]) 09/11/2019  . AKI (acute kidney injury) ([place]) 09/11/2019  . Chronic pain syndrome 09/11/2019  . Heart failure ([place]) 09/11/2019  .  No-show for appointment 07/08/2019  . Chronic systolic heart failure ([place]) 04/26/2019  . S/P  revision of total knee, right 05/14/2018  . Dislocated knee, right, sequela   . S/P revision of total knee 02/17/2018  . Failed total right knee replacement ([place])   . Chronic renal insufficiency, stage 2 (mild) 11/10/2017  . Acute CHF (congestive heart failure) ([place]) 11/10/2017  . Failed total knee arthroplasty, sequela 08/31/2017  . Lower extremity cellulitis 01/03/2013  . Diabetes mellitus ([place]) 01/03/2013  . Morbid obesity ([place]) 11/12/2006  . TOBACCO DEPENDENCE 11/12/2006  . DEPRESSIVE DISORDER, NOS 11/12/2006  . Essential hypertension 11/12/2006  . GASTROESOPHAGEAL REFLUX, NO ESOPHAGITIS 11/12/2006  . ACNE 11/12/2006  . OSTEOARTHRITIS, LOWER LEG 11/12/2006  . APNEA, SLEEP 11/12/2006   PCP:  Vonna Drafts, FNP Pharmacy:   [place], [address] [place] [address] Phone: [phone] Fax: [phone]  [place], [address] [address] Phone: [phone] Fax: [place], [address] [place] [address] Phone: [phone] Fax: [phone]  Readmission Risk Interventions Readmission Risk Prevention Plan 09/13/2019 09/13/2019  Transportation Screening Complete Complete  PCP or Specialist Appt within 5-7 Days Complete Complete  Home Care Screening Complete Complete  Medication Review (RN CM) Referral to Pharmacy Complete  Some recent data might be hidden

## 2019-09-13 NOTE — Social Work (Signed)
CSW attempted to schedule pt appointment with PCP, office closed possibly due to holiday week. CSW will place instructions to call on dc instructions.   Westley Hummer, MSW, [place] Work

## 2019-09-13 NOTE — Progress Notes (Signed)
PROGRESS NOTE    [name]   MOQ:[id number]  DOB: [birth year]  DOA: 09/10/2019 PCP: Vonna Drafts, FNP   Brief Narrative:  [name] is a 53 y.o. female with medical history significant of HFrEF with prior EF 31%, DM2, HTN, chronic pain syndrome presenting with dyspnea and hypoxia with pulse ox of 80s on exertion.  She admits to stopping her Lasix after she ran out of it about 1 month ago.    Subjective: Remains short of breath and swollen in her legs but is steadily improving.     Assessment & Plan:   Principal Problem:   Acute on chronic systolic CHF (congestive heart failure)  - cont IV diuretics- she is making improvement but remains short of breath and edematous - she admits to not taking her Lasix at home - cont Entresto -   ECHO shows an EF of 35-40% with grade 1 dCHF - Dr Virgina Jock aware that she is here- he will arrange outpt f/u for her- he notes that she has missed a couple of appts.   Active Problems: Hyperkalemia - started Lokelma - not on medications to cause the hyperkalemia- slightly improved today from 6.0 to 5.5 - follow    Essential hypertension - cont Toprol    Diabetes mellitus - Metformin on hold- cont SSI - A1c 8.8     Chronic renal insufficiency, stage 3 - cont to follow while diuresing     Chronic pain syndrome - con Butrans patch, Neurontin, Percocet- Oxycontin substituted for Xtampza  Nicotine abuse - cont Nicoderm patch  Morbid obesity  Body mass index is 79.67 kg/m.  Gout -cont Allopurinol   Time spent in minutes: 35 DVT prophylaxis: Lovenox Code Status: Full code Family Communication:  Disposition Plan: home in 1-2 days Consultants:   none Procedures:   2D ECHO 1. Left ventricular ejection fraction, by visual estimation, is 35 to 40%. The left ventricle has moderate to severely decreased function. There is mildly increased left ventricular hypertrophy.  2. Definity contrast agent was given IV to  delineate the left ventricular endocardial borders.  3. Left ventricular diastolic parameters are consistent with Grade I diastolic dysfunction (impaired relaxation).  4. The left ventricle demonstrates global hypokinesis.  5. Global right ventricle was not well visualized.The right ventricular size is not well visualized. Right vetricular wall thickness was not assessed.  6. Left atrial size was not well visualized.  7. Right atrial size was not well visualized.  8. The mitral valve is grossly normal. Trivial mitral valve regurgitation.  9. The tricuspid valve is not well visualized. 10. The aortic valve was not well visualized. Aortic valve regurgitation is not visualized. 11. The pulmonic valve was not well visualized. Pulmonic valve regurgitation is not visualized. 12. The aortic root was not well visualized. 13. Moderately elevated pulmonary artery systolic pressure. 14. The inferior vena cava is normal in size with <50% respiratory variability, suggesting right atrial pressure of 8 mmHg. 15. The interatrial septum was not well visualized.  Antimicrobials:  Anti-infectives (From admission, onward)   None       Objective: Vitals:   09/12/19 1741 09/12/19 2209 09/12/19 2304 09/13/19 0500  BP: 117/68 (!) 141/73    Pulse: 77 73 75   Resp: 16 (!) 23 18   Temp: 98.3 F (36.8 C) 97.8 F (36.6 C)    TempSrc: Oral Oral    SpO2: 92% 96% 97%   Weight:  (!) 204.3 kg  (!) 204 kg  Height:  5\' 3"  (1.6 m)      Intake/Output Summary (Last 24 hours) at 09/13/2019 1420 Last data filed at 09/12/2019 2307 Gross per 24 hour  Intake 129 ml  Output 1200 ml  Net -1071 ml   Filed Weights   09/12/19 0500 09/12/19 2209 09/13/19 0500  Weight: (!) 205 kg (!) 204.3 kg (!) 204 kg    Examination: General exam: Appears comfortable  HEENT: PERRLA, oral mucosa moist, no sclera icterus or thrush Respiratory system: difficult to hear breath sounds due to size- Respiratory effort  normal. Cardiovascular system: S1 & S2 heard,  No murmurs  Gastrointestinal system: Abdomen soft, non-tender, nondistended. Normal bowel sounds   Central nervous system: Alert and oriented. No focal neurological deficits. Extremities: No cyanosis, clubbing - + pedal edema Skin: No rashes or ulcers Psychiatry:  Mood & affect appropriate.     Data Reviewed: I have personally reviewed following labs and imaging studies  CBC: Recent Labs  Lab 09/10/19 2341 09/11/19 1030 09/12/19 0511  WBC 6.2 5.9 6.1  NEUTROABS 4.4  --   --   HGB 11.8* 10.5* 10.9*  HCT 38.4 34.5* 36.6  MCV 99.0 98.0 98.4  PLT 210 181 540   Basic Metabolic Panel: Recent Labs  Lab 09/10/19 2341 09/11/19 1030 09/12/19 0511 09/12/19 1639 09/13/19 0622  NA 135 135 137 135 137  K 5.8* 5.5* 6.0* 5.8* 5.5*  CL 102 105 102 102 101  CO2 25 22 28 28 27   GLUCOSE 343* 271* 190* 192* 156*  BUN 46* 46* 49* 50* 51*  CREATININE 1.71* 1.55* 1.72* 1.72* 1.67*  CALCIUM 8.5* 8.0* 8.4* 8.4* 8.3*   GFR: Estimated Creatinine Clearance: 71.1 mL/min (A) (by C-G formula based on SCr of 1.67 mg/dL (H)). Liver Function Tests: Recent Labs  Lab 09/10/19 2341  AST 26  ALT 18  ALKPHOS 150*  BILITOT 0.3  PROT 7.7  ALBUMIN 3.1*   No results for input(s): LIPASE, AMYLASE in the last 168 hours. No results for input(s): AMMONIA in the last 168 hours. Coagulation Profile: No results for input(s): INR, PROTIME in the last 168 hours. Cardiac Enzymes: No results for input(s): CKTOTAL, CKMB, CKMBINDEX, TROPONINI in the last 168 hours. BNP (last 3 results) No results for input(s): PROBNP in the last 8760 hours. HbA1C: Recent Labs    09/11/19 1030  HGBA1C 8.8*   CBG: Recent Labs  Lab 09/12/19 1226 09/12/19 1558 09/12/19 2138 09/13/19 0646 09/13/19 1110  GLUCAP 189* 187* 189* 138* 206*   Lipid Profile: No results for input(s): CHOL, HDL, LDLCALC, TRIG, CHOLHDL, LDLDIRECT in the last 72 hours. Thyroid Function  Tests: No results for input(s): TSH, T4TOTAL, FREET4, T3FREE, THYROIDAB in the last 72 hours. Anemia Panel: No results for input(s): VITAMINB12, FOLATE, FERRITIN, TIBC, IRON, RETICCTPCT in the last 72 hours. Urine analysis:    Component Value Date/Time   COLORURINE YELLOW 12/31/2018 [place] 12/31/2018 0457   LABSPEC 1.009 12/31/2018 0457   PHURINE 5.0 12/31/2018 0457   GLUCOSEU >=500 (A) 12/31/2018 0457   HGBUR NEGATIVE 12/31/2018 0457   BILIRUBINUR NEGATIVE 12/31/2018 0457   KETONESUR NEGATIVE 12/31/2018 0457   PROTEINUR NEGATIVE 12/31/2018 0457   UROBILINOGEN 1.0 05/16/2013 1758   NITRITE POSITIVE (A) 12/31/2018 0457   LEUKOCYTESUR TRACE (A) 12/31/2018 0457   Sepsis Labs: @LABRCNTIP (procalcitonin:4,lacticidven:4) ) Recent Results (from the past 240 hour(s))  SARS CORONAVIRUS 2 (TAT 6-24 HRS) Nasopharyngeal Nasopharyngeal Swab     Status: None   Collection Time: 09/11/19  5:57  AM   Specimen: Nasopharyngeal Swab  Result Value Ref Range Status   SARS Coronavirus 2 NEGATIVE NEGATIVE Final    Comment: (NOTE) SARS-CoV-2 target nucleic acids are NOT DETECTED. The SARS-CoV-2 RNA is generally detectable in upper and lower respiratory specimens during the acute phase of infection. Negative results do not preclude SARS-CoV-2 infection, do not rule out co-infections with other pathogens, and should not be used as the sole basis for treatment or other patient management decisions. Negative results must be combined with clinical observations, patient history, and epidemiological information. The expected result is Negative. Fact Sheet for Patients: SugarRoll.be Fact Sheet for Healthcare Providers: https://www.woods-mathews.com/ This test is not yet approved or cleared by the [place] FDA and  has been authorized for detection and/or diagnosis of SARS-CoV-2 by FDA under an Emergency Use Authorization (EUA). This EUA will  remain  in effect (meaning this test can be used) for the duration of the COVID-19 declaration under Section 56 4(b)(1) of the Act, 21 U.S.C. section 360bbb-3(b)(1), unless the authorization is terminated or revoked sooner. Performed at [facility], [place] [address], [place], [place] [address]          Radiology Studies: ECHOCARDIOGRAM COMPLETE  Result Date: 09/12/2019   ECHOCARDIOGRAM REPORT   Patient Name:   [name] Date of Exam: 09/12/2019 Medical Rec #:  [id number]        Height:       63.0 in Accession #:    [id number]       Weight:       451.9 lb Date of Birth:  [birth year]         BSA:          2.73 m Patient Age:    60 years         BP:           115/71 mmHg Patient Gender: F                HR:           79 bpm. Exam Location:  Inpatient Procedure: 2D Echo Indications:    CHF 428  History:        Patient has no prior history of Echocardiogram examinations.                 Risk Factors:Hypertension and Diabetes.  Sonographer:    Jannett Celestine RDCS (AE) Referring Phys: [id number] JARED M GARDNER  Sonographer Comments: Technically difficult study due to poor echo windows, suboptimal parasternal window and patient is morbidly obese. Image acquisition challenging due to patient body habitus. off axis apical windows. restricted mobility IMPRESSIONS  1. Left ventricular ejection fraction, by visual estimation, is 35 to 40%. The left ventricle has moderate to severely decreased function. There is mildly increased left ventricular hypertrophy.  2. Definity contrast agent was given IV to delineate the left ventricular endocardial borders.  3. Left ventricular diastolic parameters are consistent with Grade I diastolic dysfunction (impaired relaxation).  4. The left ventricle demonstrates global hypokinesis.  5. Global right ventricle was not well visualized.The right ventricular size is not well visualized. Right vetricular wall thickness was not assessed.  6. Left atrial size was not well  visualized.  7. Right atrial size was not well visualized.  8. The mitral valve is grossly normal. Trivial mitral valve regurgitation.  9. The tricuspid valve is not well visualized. 10. The aortic valve was not well visualized. Aortic valve regurgitation is not visualized. 11. The  pulmonic valve was not well visualized. Pulmonic valve regurgitation is not visualized. 12. The aortic root was not well visualized. 13. Moderately elevated pulmonary artery systolic pressure. 14. The inferior vena cava is normal in size with <50% respiratory variability, suggesting right atrial pressure of 8 mmHg. 15. The interatrial septum was not well visualized. FINDINGS  Left Ventricle: Left ventricular ejection fraction, by visual estimation, is 35 to 40%. The left ventricle has moderate to severely decreased function. Definity contrast agent was given IV to delineate the left ventricular endocardial borders. The left ventricle demonstrates global hypokinesis. There is mildly increased left ventricular hypertrophy. Left ventricular diastolic parameters are consistent with Grade I diastolic dysfunction (impaired relaxation). Indeterminate filling pressures. Right Ventricle: The right ventricular size is not well visualized. Right vetricular wall thickness was not assessed. Global RV systolic function is was not well visualized. The tricuspid regurgitant velocity is 3.08 m/s, and with an assumed right atrial  pressure of 8 mmHg, the estimated right ventricular systolic pressure is moderately elevated at 45.9 mmHg. Left Atrium: Left atrial size was not well visualized. Right Atrium: Right atrial size was not well visualized Pericardium: There is no evidence of pericardial effusion. Mitral Valve: The mitral valve is grossly normal. Trivial mitral valve regurgitation. Tricuspid Valve: The tricuspid valve is not well visualized. Tricuspid valve regurgitation is trivial. Aortic Valve: The aortic valve was not well visualized. Aortic valve  regurgitation is not visualized. Pulmonic Valve: The pulmonic valve was not well visualized. Pulmonic valve regurgitation is not visualized. Pulmonic regurgitation is not visualized. Aorta: The aortic root was not well visualized. Venous: The inferior vena cava is normal in size with less than 50% respiratory variability, suggesting right atrial pressure of 8 mmHg. IAS/Shunts: The interatrial septum was not well visualized.   Diastology LV e' medial:   6.42 cm/s LV E/e' medial: 11.9  AORTIC VALVE LVOT Vmax:   86.80 cm/s LVOT Vmean:  56.100 cm/s LVOT VTI:    0.123 m MITRAL VALVE                       TRICUSPID VALVE MV Area (PHT): 5.54 cm            TR Peak grad:   37.9 mmHg MV PHT:        39.73 msec          TR Vmax:        308.00 cm/s MV Decel Time: 137 msec MV E velocity: 76.70 cm/s 103 cm/s SHUNTS                                    Systemic VTI: 0.12 m  Lyman Bishop MD Electronically signed by Lyman Bishop MD Signature Date/Time: 09/12/2019/12:01:52 PM    Final       Scheduled Meds: . allopurinol  300 mg Oral Daily  . [START ON 09/17/2019] buprenorphine  1 patch Transdermal Q Sat  . enoxaparin (LOVENOX) injection  100 mg Subcutaneous Q24H  . furosemide  60 mg Intravenous TID  . gabapentin  100 mg Oral TID  . insulin aspart  0-15 Units Subcutaneous TID WC  . insulin aspart  0-5 Units Subcutaneous QHS  . metoprolol succinate  37.5 mg Oral Daily  . nicotine  21 mg Transdermal Daily  . oxyCODONE  40 mg Oral BID  . sacubitril-valsartan  1 tablet Oral BID  . sodium chloride flush  3  mL Intravenous Q12H  . sodium zirconium cyclosilicate  10 g Oral Daily   Continuous Infusions: . sodium chloride Stopped (09/11/19 0657)     LOS: 2 days      Debbe Odea, MD [facility] Pager: [email or web address] Password [email or web address] 09/13/2019, 2:20 PM

## 2019-09-14 LAB — GLUCOSE, CAPILLARY
Glucose-Capillary: 148 mg/dL — ABNORMAL HIGH (ref 70–99)
Glucose-Capillary: 167 mg/dL — ABNORMAL HIGH (ref 70–99)
Glucose-Capillary: 213 mg/dL — ABNORMAL HIGH (ref 70–99)
Glucose-Capillary: 177 mg/dL — ABNORMAL HIGH (ref 70–99)

## 2019-09-14 LAB — BASIC METABOLIC PANEL
Anion gap: 8 (ref 5–15)
BUN: 50 mg/dL — ABNORMAL HIGH (ref 6–20)
CO2: 25 mmol/L (ref 22–32)
Calcium: 8.2 mg/dL — ABNORMAL LOW (ref 8.9–10.3)
Chloride: 102 mmol/L (ref 98–111)
Creatinine, Ser: 1.8 mg/dL — ABNORMAL HIGH (ref 0.44–1.00)
GFR calc Af Amer: 37 mL/min — ABNORMAL LOW (ref >60)
GFR calc non Af Amer: 32 mL/min — ABNORMAL LOW (ref >60)
Glucose, Bld: 158 mg/dL — ABNORMAL HIGH (ref 70–99)
Potassium: 5.8 mmol/L — ABNORMAL HIGH (ref 3.5–5.1)
Sodium: 135 mmol/L (ref 135–145)

## 2019-09-14 MED ORDER — PERFLUTREN LIPID MICROSPHERE
1.0000 mL | INTRAVENOUS | Status: AC | PRN
  Administered 2019-09-12: 12:00:00 3 mL via INTRAVENOUS
  Filled 2019-09-14: qty 10

## 2019-09-14 MED ORDER — SODIUM ZIRCONIUM CYCLOSILICATE 10 G PO PACK
10.0000 g | PACK | Freq: Two times a day (BID) | ORAL | Status: DC
  Administered 2019-09-14 – 2019-09-19 (×10): 10 g via ORAL
  Filled 2019-09-14 (×10): qty 1

## 2019-09-14 NOTE — Progress Notes (Signed)
Pt. States she can place cpap on herself when ready. RT informed pt. To notify if she needs any assistance.

## 2019-09-14 NOTE — Plan of Care (Signed)

## 2019-09-14 NOTE — Progress Notes (Signed)
[name]   GBT:[id number]  DOB: [birth year]  DOA: 09/10/2019 PCP: Vonna Drafts, FNP   Brief Narrative:  [name] is a 53 y.o. female with medical history significant of HFrEF with prior EF 31%, DM2, HTN, chronic pain syndrome presenting with dyspnea and hypoxia with pulse ox of 80s on exertion.  She admits to stopping her Lasix after she ran out of it about 1 month ago as well as not taking it properly.    Subjective: Large amount of urine output    Assessment & Plan:     Acute on chronic systolic CHF (congestive heart failure)  - cont IV diuretics with close monitoring of her daily weights and I/Os as well as daily BMPs - she admits to not taking her Lasix regularly prior to running out 1 month ago - last weight in September 157 kg-- came into hospital at 205 kg-- weight slowly lowering to 203 kg- she is having marked diuresis  - cont Entresto -   ECHO shows an EF of 35-40% with grade 1 dCHF - Dr Virgina Jock aware that she is here- he will arrange outpt f/u for her- he notes that she has missed a couple of appts.   Hyperkalemia - started [facility] - ? If entresto?    Essential hypertension - cont Toprol- may need to lower dose due to hypotension    Diabetes mellitus - Metformin on hold- cont SSI - A1c 8.8     Chronic renal insufficiency, stage 3 - cont to follow while diuresing     Chronic pain syndrome - con Butrans patch, Neurontin, Percocet- Oxycontin substituted for Xtampza  Nicotine abuse - cont Nicoderm patch  Morbid obesity Estimated body mass index is 79.24 kg/m as calculated from the following:   Height as of this encounter: 5\' 3"  (1.6 m).   Weight as of this encounter: 202.9 kg.  Gout -cont Allopurinol   Time spent in minutes: 35 min DVT prophylaxis: Lovenox Code Status: Full code Family Communication: aide at bedside Disposition Plan: home in several more days, suspect we have a lot more diuresis to  go Consultants:   none Procedures:   2D ECHO 1. Left ventricular ejection fraction, by visual estimation, is 35 to 40%. The left ventricle has moderate to severely decreased function. There is mildly increased left ventricular hypertrophy.  2. Definity contrast agent was given IV to delineate the left ventricular endocardial borders.  3. Left ventricular diastolic parameters are consistent with Grade I diastolic dysfunction (impaired relaxation).  4. The left ventricle demonstrates global hypokinesis.  5. Global right ventricle was not well visualized.The right ventricular size is not well visualized. Right vetricular wall thickness was not assessed.  6. Left atrial size was not well visualized.  7. Right atrial size was not well visualized.  8. The mitral valve is grossly normal. Trivial mitral valve regurgitation.  9. The tricuspid valve is not well visualized. 10. The aortic valve was not well visualized. Aortic valve regurgitation is not visualized. 11. The pulmonic valve was not well visualized. Pulmonic valve regurgitation is not visualized. 12. The aortic root was not well visualized. 13. Moderately elevated pulmonary artery systolic pressure. 14. The inferior vena cava is normal in size with <50% respiratory variability, suggesting right atrial pressure of 8 mmHg. 15. The interatrial septum was not well visualized.    Objective: Vitals:   09/14/19 0008 09/14/19 0033 09/14/19 0448 09/14/19 0500  BP: 120/73  (!) 71/06  Pulse: 76 74 75   Resp: 18 16 16    Temp: 98.8 F (37.1 C)  98 F (36.7 C)   TempSrc: Oral  Oral   SpO2: 95% 96% 98%   Weight:    (!) 202.9 kg  Height:        Intake/Output Summary (Last 24 hours) at 09/14/2019 1146 Last data filed at 09/14/2019 0458 Gross per 24 hour  Intake 360 ml  Output 3500 ml  Net -3140 ml   Filed Weights   09/12/19 2209 09/13/19 0500 09/14/19 0500  Weight: (!) 204.3 kg (!) 204 kg (!) 202.9 kg    Examination: General  exam: pleasant and cooperative, NAD Respiratory system: diminished due to size, no increased work of breathing Cardiovascular system: rrr Gastrointestinal system: obese, soft, NT Central nervous system: Alert and oriented. No focal neurological deficits. Extremities: moves all 4 ext, edema up to hip    Data Reviewed: I have personally reviewed following labs and imaging studies  CBC: Recent Labs  Lab 09/10/19 2341 09/11/19 1030 09/12/19 0511  WBC 6.2 5.9 6.1  NEUTROABS 4.4  --   --   HGB 11.8* 10.5* 10.9*  HCT 38.4 34.5* 36.6  MCV 99.0 98.0 98.4  PLT 210 181 109   Basic Metabolic Panel: Recent Labs  Lab 09/11/19 1030 09/12/19 0511 09/12/19 1639 09/13/19 0622 09/14/19 0544  NA 135 137 135 137 135  K 5.5* 6.0* 5.8* 5.5* 5.8*  CL 105 102 102 101 102  CO2 22 28 28 27 25   GLUCOSE 271* 190* 192* 156* 158*  BUN 46* 49* 50* 51* 50*  CREATININE 1.55* 1.72* 1.72* 1.67* 1.80*  CALCIUM 8.0* 8.4* 8.4* 8.3* 8.2*   GFR: Estimated Creatinine Clearance: 65.7 mL/min (A) (by C-G formula based on SCr of 1.8 mg/dL (H)). Liver Function Tests: Recent Labs  Lab 09/10/19 2341  AST 26  ALT 18  ALKPHOS 150*  BILITOT 0.3  PROT 7.7  ALBUMIN 3.1*   No results for input(s): LIPASE, AMYLASE in the last 168 hours. No results for input(s): AMMONIA in the last 168 hours. Coagulation Profile: No results for input(s): INR, PROTIME in the last 168 hours. Cardiac Enzymes: No results for input(s): CKTOTAL, CKMB, CKMBINDEX, TROPONINI in the last 168 hours. BNP (last 3 results) No results for input(s): PROBNP in the last 8760 hours. HbA1C: No results for input(s): HGBA1C in the last 72 hours. CBG: Recent Labs  Lab 09/13/19 1110 09/13/19 1620 09/13/19 2120 09/14/19 0616 09/14/19 1106  GLUCAP 206* 173* 151* 148* 167*   Lipid Profile: No results for input(s): CHOL, HDL, LDLCALC, TRIG, CHOLHDL, LDLDIRECT in the last 72 hours. Thyroid Function Tests: No results for input(s): TSH,  T4TOTAL, FREET4, T3FREE, THYROIDAB in the last 72 hours. Anemia Panel: No results for input(s): VITAMINB12, FOLATE, FERRITIN, TIBC, IRON, RETICCTPCT in the last 72 hours. Urine analysis:    Component Value Date/Time   COLORURINE YELLOW 12/31/2018 0457   APPEARANCEUR CLEAR 12/31/2018 0457   LABSPEC 1.009 12/31/2018 0457   PHURINE 5.0 12/31/2018 0457   GLUCOSEU >=500 (A) 12/31/2018 0457   HGBUR NEGATIVE 12/31/2018 0457   BILIRUBINUR NEGATIVE 12/31/2018 0457   KETONESUR NEGATIVE 12/31/2018 0457   PROTEINUR NEGATIVE 12/31/2018 0457   UROBILINOGEN 1.0 05/16/2013 1758   NITRITE POSITIVE (A) 12/31/2018 0457   LEUKOCYTESUR TRACE (A) 12/31/2018 0457    Recent Results (from the past 240 hour(s))  SARS CORONAVIRUS 2 (TAT 6-24 HRS) Nasopharyngeal Nasopharyngeal Swab     Status: None   Collection Time:  09/11/19  5:57 AM   Specimen: Nasopharyngeal Swab  Result Value Ref Range Status   SARS Coronavirus 2 NEGATIVE NEGATIVE Final    Comment: (NOTE) SARS-CoV-2 target nucleic acids are NOT DETECTED. The SARS-CoV-2 RNA is generally detectable in upper and lower respiratory specimens during the acute phase of infection. Negative results do not preclude SARS-CoV-2 infection, do not rule out co-infections with other pathogens, and should not be used as the sole basis for treatment or other patient management decisions. Negative results must be combined with clinical observations, patient history, and epidemiological information. The expected result is Negative. Fact Sheet for Patients: SugarRoll.be Fact Sheet for Healthcare Providers: https://www.woods-mathews.com/ This test is not yet approved or cleared by the [place] FDA and  has been authorized for detection and/or diagnosis of SARS-CoV-2 by FDA under an Emergency Use Authorization (EUA). This EUA will remain  in effect (meaning this test can be used) for the duration of the COVID-19 declaration  under Section 56 4(b)(1) of the Act, 21 U.S.C. section 360bbb-3(b)(1), unless the authorization is terminated or revoked sooner. Performed at [facility], [place] [address], [place], [place] [address]          Radiology Studies: No results found.    Scheduled Meds: . allopurinol  300 mg Oral Daily  . [START ON 09/17/2019] buprenorphine  1 patch Transdermal Q Sat  . enoxaparin (LOVENOX) injection  100 mg Subcutaneous Q24H  . furosemide  60 mg Intravenous TID  . gabapentin  100 mg Oral TID  . insulin aspart  0-15 Units Subcutaneous TID WC  . insulin aspart  0-5 Units Subcutaneous QHS  . metoprolol succinate  37.5 mg Oral Daily  . nicotine  21 mg Transdermal Daily  . oxyCODONE  40 mg Oral BID  . sacubitril-valsartan  1 tablet Oral BID  . sodium chloride flush  3 mL Intravenous Q12H  . sodium zirconium cyclosilicate  10 g Oral BID   Continuous Infusions: . sodium chloride Stopped (09/11/19 0657)     LOS: 3 days      Geradine Girt, DO [facility]  [email or web address] Password [email or web address] 09/14/2019, 11:46 AM

## 2019-09-14 NOTE — Progress Notes (Signed)
Patient stated she would place herself on CPAP when ready. RT instructed patient to have RT called if assistance is needed. RT will monitor as needed.

## 2019-09-15 LAB — BASIC METABOLIC PANEL
Anion gap: 5 (ref 5–15)
BUN: 52 mg/dL — ABNORMAL HIGH (ref 6–20)
CO2: 29 mmol/L (ref 22–32)
Calcium: 8 mg/dL — ABNORMAL LOW (ref 8.9–10.3)
Chloride: 101 mmol/L (ref 98–111)
Creatinine, Ser: 1.93 mg/dL — ABNORMAL HIGH (ref 0.44–1.00)
GFR calc Af Amer: 34 mL/min — ABNORMAL LOW (ref >60)
GFR calc non Af Amer: 29 mL/min — ABNORMAL LOW (ref >60)
Glucose, Bld: 167 mg/dL — ABNORMAL HIGH (ref 70–99)
Potassium: 5.9 mmol/L — ABNORMAL HIGH (ref 3.5–5.1)
Sodium: 135 mmol/L (ref 135–145)

## 2019-09-15 LAB — CBC
HCT: 33.6 % — ABNORMAL LOW (ref 36.0–46.0)
Hemoglobin: 10.3 g/dL — ABNORMAL LOW (ref 12.0–15.0)
MCH: 29.6 pg (ref 26.0–34.0)
MCHC: 30.7 g/dL (ref 30.0–36.0)
MCV: 96.6 fL (ref 80.0–100.0)
Platelets: 187 10*3/uL (ref 150–400)
RBC: 3.48 MIL/uL — ABNORMAL LOW (ref 3.87–5.11)
RDW: 13.4 % (ref 11.5–15.5)
WBC: 4.9 10*3/uL (ref 4.0–10.5)
nRBC: 0 % (ref 0.0–0.2)

## 2019-09-15 LAB — GLUCOSE, CAPILLARY
Glucose-Capillary: 136 mg/dL — ABNORMAL HIGH (ref 70–99)
Glucose-Capillary: 161 mg/dL — ABNORMAL HIGH (ref 70–99)
Glucose-Capillary: 204 mg/dL — ABNORMAL HIGH (ref 70–99)
Glucose-Capillary: 207 mg/dL — ABNORMAL HIGH (ref 70–99)

## 2019-09-15 MED ORDER — SENNOSIDES-DOCUSATE SODIUM 8.6-50 MG PO TABS
1.0000 | ORAL_TABLET | Freq: Every day | ORAL | Status: DC
  Administered 2019-09-16 – 2019-09-18 (×2): 1 via ORAL
  Filled 2019-09-15 (×4): qty 1

## 2019-09-15 MED ORDER — FUROSEMIDE 10 MG/ML IJ SOLN
60.0000 mg | Freq: Two times a day (BID) | INTRAMUSCULAR | Status: DC
  Administered 2019-09-15 (×2): 60 mg via INTRAVENOUS
  Filled 2019-09-15 (×2): qty 6

## 2019-09-15 NOTE — Progress Notes (Signed)
Placed patient on CPAP for the night via auto-mode.  

## 2019-09-15 NOTE — Progress Notes (Signed)
PROGRESS NOTE    [name]   QMG:[id number]  DOB: 15-Jun-1966  DOA: 09/10/2019 PCP: Carrie Drafts, FNP   Brief Narrative:  [name] is a 53 y.o. female with medical history significant of HFrEF with prior EF 31%, DM2, HTN, chronic pain syndrome presenting with dyspnea and hypoxia with pulse ox of 80s on exertion.  She admits to stopping her Lasix after she ran out of it about 1 month ago as well as not taking it properly.    Subjective: Feeling better, asking about going home    Assessment & Plan:     Acute on chronic systolic CHF (congestive heart failure)  - cont IV diuretics with close monitoring of her daily weights and I/Os as well as daily BMPs - she admits to not taking her Lasix regularly prior to running out 1 month ago - last weight in September 157 kg-- came into hospital at 205 kg-- weight slowly lowering to 203 kg- she is having marked diuresis  - cont Entresto -   ECHO shows an EF of 35-40% with grade 1 dCHF -10L of diuresis-- will change to BID instead of TID due to CR slowly elevating - Dr Virgina Jock aware that she is here- he will arrange outpt f/u for her- he notes that she has missed a couple of appts.   Hyperkalemia - started Lokelma BID - hold entresto    Essential hypertension - cont Toprol- may need to lower dose due to hypotension    Diabetes mellitus - Metformin on hold- cont SSI - A1c 8.8     Chronic renal insufficiency, stage 3 - cont to follow while diuresing     Chronic pain syndrome - cont Butrans patch, Neurontin, Percocet- Oxycontin substituted for Xtampza  Nicotine abuse - cont Nicoderm patch  Morbid obesity Estimated body mass index is 79.43 kg/m as calculated from the following:   Height as of this encounter: 5\' 3"  (1.6 m).   Weight as of this encounter: 203.4 kg.  Gout -cont Allopurinol   Time spent in minutes: 35 min DVT prophylaxis: Lovenox Code Status: Full code Family Communication: aide at  bedside Disposition Plan: home in several more days, suspect we have a lot more diuresis to go Consultants:   none Procedures:   2D ECHO 1. Left ventricular ejection fraction, by visual estimation, is 35 to 40%. The left ventricle has moderate to severely decreased function. There is mildly increased left ventricular hypertrophy.  2. Definity contrast agent was given IV to delineate the left ventricular endocardial borders.  3. Left ventricular diastolic parameters are consistent with Grade I diastolic dysfunction (impaired relaxation).  4. The left ventricle demonstrates global hypokinesis.  5. Global right ventricle was not well visualized.The right ventricular size is not well visualized. Right vetricular wall thickness was not assessed.  6. Left atrial size was not well visualized.  7. Right atrial size was not well visualized.  8. The mitral valve is grossly normal. Trivial mitral valve regurgitation.  9. The tricuspid valve is not well visualized. 10. The aortic valve was not well visualized. Aortic valve regurgitation is not visualized. 11. The pulmonic valve was not well visualized. Pulmonic valve regurgitation is not visualized. 12. The aortic root was not well visualized. 13. Moderately elevated pulmonary artery systolic pressure. 14. The inferior vena cava is normal in size with <50% respiratory variability, suggesting right atrial pressure of 8 mmHg. 15. The interatrial septum was not well visualized.    Objective: Vitals:  09/14/19 1212 09/14/19 1753 09/15/19 0048 09/15/19 0616  BP: 106/66 138/63 99/63 (!) 108/95  Pulse: 74 88 83 74  Resp: 16 18 16 15   Temp: 97.8 F (36.6 C) 98.6 F (37 C) 98.2 F (36.8 C) 98.8 F (37.1 C)  TempSrc: Oral Oral Oral Oral  SpO2: 90% 92% 97% 95%  Weight:    (!) 203.4 kg  Height:        Intake/Output Summary (Last 24 hours) at 09/15/2019 1012 Last data filed at 09/15/2019 0800 Gross per 24 hour  Intake 240 ml  Output 4400 ml   Net -4160 ml   Filed Weights   09/13/19 0500 09/14/19 0500 09/15/19 0616  Weight: (!) 204 kg (!) 202.9 kg (!) 203.4 kg    Examination: In bed, NAD Pleasant and cooperative rrr No increased work of breathing but lungs diminished +LE edema up to outer thighs    Data Reviewed: I have personally reviewed following labs and imaging studies  CBC: Recent Labs  Lab 09/10/19 2341 09/11/19 1030 09/12/19 0511 09/15/19 0534  WBC 6.2 5.9 6.1 4.9  NEUTROABS 4.4  --   --   --   HGB 11.8* 10.5* 10.9* 10.3*  HCT 38.4 34.5* 36.6 33.6*  MCV 99.0 98.0 98.4 96.6  PLT 210 181 205 287   Basic Metabolic Panel: Recent Labs  Lab 09/12/19 0511 09/12/19 1639 09/13/19 0622 09/14/19 0544 09/15/19 0534  NA 137 135 137 135 135  K 6.0* 5.8* 5.5* 5.8* 5.9*  CL 102 102 101 102 101  CO2 28 28 27 25 29   GLUCOSE 190* 192* 156* 158* 167*  BUN 49* 50* 51* 50* 52*  CREATININE 1.72* 1.72* 1.67* 1.80* 1.93*  CALCIUM 8.4* 8.4* 8.3* 8.2* 8.0*   GFR: Estimated Creatinine Clearance: 61.4 mL/min (A) (by C-G formula based on SCr of 1.93 mg/dL (H)). Liver Function Tests: Recent Labs  Lab 09/10/19 2341  AST 26  ALT 18  ALKPHOS 150*  BILITOT 0.3  PROT 7.7  ALBUMIN 3.1*   No results for input(s): LIPASE, AMYLASE in the last 168 hours. No results for input(s): AMMONIA in the last 168 hours. Coagulation Profile: No results for input(s): INR, PROTIME in the last 168 hours. Cardiac Enzymes: No results for input(s): CKTOTAL, CKMB, CKMBINDEX, TROPONINI in the last 168 hours. BNP (last 3 results) No results for input(s): PROBNP in the last 8760 hours. HbA1C: No results for input(s): HGBA1C in the last 72 hours. CBG: Recent Labs  Lab 09/14/19 0616 09/14/19 1106 09/14/19 1621 09/14/19 2152 09/15/19 0619  GLUCAP 148* 167* 213* 177* 136*   Lipid Profile: No results for input(s): CHOL, HDL, LDLCALC, TRIG, CHOLHDL, LDLDIRECT in the last 72 hours. Thyroid Function Tests: No results for input(s):  TSH, T4TOTAL, FREET4, T3FREE, THYROIDAB in the last 72 hours. Anemia Panel: No results for input(s): VITAMINB12, FOLATE, FERRITIN, TIBC, IRON, RETICCTPCT in the last 72 hours. Urine analysis:    Component Value Date/Time   COLORURINE YELLOW 12/31/2018 0457   APPEARANCEUR CLEAR 12/31/2018 0457   LABSPEC 1.009 12/31/2018 0457   PHURINE 5.0 12/31/2018 0457   GLUCOSEU >=500 (A) 12/31/2018 0457   HGBUR NEGATIVE 12/31/2018 0457   BILIRUBINUR NEGATIVE 12/31/2018 0457   KETONESUR NEGATIVE 12/31/2018 0457   PROTEINUR NEGATIVE 12/31/2018 0457   UROBILINOGEN 1.0 05/16/2013 1758   NITRITE POSITIVE (A) 12/31/2018 0457   LEUKOCYTESUR TRACE (A) 12/31/2018 0457    Recent Results (from the past 240 hour(s))  SARS CORONAVIRUS 2 (TAT 6-24 HRS) Nasopharyngeal Nasopharyngeal Swab  Status: None   Collection Time: 09/11/19  5:57 AM   Specimen: Nasopharyngeal Swab  Result Value Ref Range Status   SARS Coronavirus 2 NEGATIVE NEGATIVE Final    Comment: (NOTE) SARS-CoV-2 target nucleic acids are NOT DETECTED. The SARS-CoV-2 RNA is generally detectable in upper and lower respiratory specimens during the acute phase of infection. Negative results do not preclude SARS-CoV-2 infection, do not rule out co-infections with other pathogens, and should not be used as the sole basis for treatment or other patient management decisions. Negative results must be combined with clinical observations, patient history, and epidemiological information. The expected result is Negative. Fact Sheet for Patients: SugarRoll.be Fact Sheet for Healthcare Providers: https://www.woods-mathews.com/ This test is not yet approved or cleared by the [place] FDA and  has been authorized for detection and/or diagnosis of SARS-CoV-2 by FDA under an Emergency Use Authorization (EUA). This EUA will remain  in effect (meaning this test can be used) for the duration of the COVID-19  declaration under Section 56 4(b)(1) of the Act, 21 U.S.C. section 360bbb-3(b)(1), unless the authorization is terminated or revoked sooner. Performed at [facility], [place] [address], [place], [place] [address]          Radiology Studies: No results found.    Scheduled Meds: . allopurinol  300 mg Oral Daily  . [START ON 09/17/2019] buprenorphine  1 patch Transdermal Q Sat  . enoxaparin (LOVENOX) injection  100 mg Subcutaneous Q24H  . furosemide  60 mg Intravenous Q12H  . gabapentin  100 mg Oral TID  . insulin aspart  0-15 Units Subcutaneous TID WC  . insulin aspart  0-5 Units Subcutaneous QHS  . metoprolol succinate  37.5 mg Oral Daily  . nicotine  21 mg Transdermal Daily  . oxyCODONE  40 mg Oral BID  . senna-docusate  1 tablet Oral QHS  . sodium chloride flush  3 mL Intravenous Q12H  . sodium zirconium cyclosilicate  10 g Oral BID   Continuous Infusions: . sodium chloride Stopped (09/11/19 0657)     LOS: 4 days      Geradine Girt, DO [facility]  [email or web address] Password [email or web address] 09/15/2019, 10:12 AM

## 2019-09-15 NOTE — Progress Notes (Signed)
Physical Therapy Treatment Patient Details Name: [name] MRN: [id number] DOB: 05-14-1966 Today's Date: 09/15/2019    History of Present Illness 53 yo female admitted to ED on 12/26 with CHF exacerbation, with fluid retention and shortness of breath requiring supplemental O2. PMH includes HFrEF with prior EF of 31%, DMII with neuropathy, HTN, chronic pain, cardiomyopathy, bilateral TKR with revision, ACDF 2011, lumbar laminectomy.    PT Comments    Pt admitted with above diagnosis. Pt was able to ambulate short distances in room without assist with RW.  Pt is most likely close to baseline.  Pt did desat with activity and sat notes below.  Pt reports feeling less winded with O2 with activity.  PRogressing. Pt currently with functional limitations due to the deficits listed below (see PT Problem List). Pt will benefit from skilled PT to increase their independence and safety with mobility to allow discharge to the venue listed below.   SATURATION QUALIFICATIONS: (This note is used to comply with regulatory documentation for home oxygen)  Patient Saturations on Room Air at Rest = 89%  Patient Saturations on Room Air while Ambulating = 86%  Patient Saturations on 2 Liters of oxygen while Ambulating = 94%  Please briefly explain why patient needs home oxygen:Pt required 2LO2 with activity to keep sats >90%.  Was 89% at rest on RA.     Follow Up Recommendations  Home health PT;Supervision for mobility/OOB     Equipment Recommendations  3in1 (PT)(bariatric)    Recommendations for Other Services       Precautions / Restrictions Precautions Precautions: Fall Restrictions Weight Bearing Restrictions: No    Mobility  Bed Mobility Overal bed mobility: Needs Assistance Bed Mobility: Supine to Sit;Sit to Supine     Supine to sit: Min assist;HOB elevated     General bed mobility comments: min assist for supine to sit for LE, pt with very increased time to come to EOB.    Transfers Overall transfer level: Needs assistance Equipment used: Rolling walker (2 wheeled) Transfers: Sit to/from Stand Sit to Stand: Supervision         General transfer comment: No assist for power up other than pt asked PT to hold the RW asshe stood from bed as she pulled up on it a little.  Pt states she sleeps in recliner at home and doesnt have to stand from bed.   Sit to stand x2, from bed and from bari [facility].  Assisted pt with cleaning after she urinated in toilet as she does not wipe herself at home.   Ambulation/Gait Ambulation/Gait assistance: Supervision Gait Distance (Feet): 10 Feet(5 feet x 2) Assistive device: Rolling walker (2 wheeled) Gait Pattern/deviations: Step-through pattern;Decreased stride length;Trunk flexed;Wide base of support   Gait velocity interpretation: <1.31 ft/sec, indicative of household ambulator General Gait Details: No assist for steadying as pt able to walk without assist or cues. Pt with heavy forward flexion and wide BOS secondary to body habitus and LE swelling.  Pt had PT set up the bari commode against the wall and states that she walks very short distances at home.  Also set up chair and pt able to walk from bari [facility] to the recliner.  Pt very safe with RW and has her own way of transitioning.     Stairs             Wheelchair Mobility    Modified Rankin (Stroke Patients Only)       Balance Overall balance assessment: Needs assistance;History  of Falls Sitting-balance support: No upper extremity supported;Feet supported Sitting balance-Leahy Scale: Good     Standing balance support: Bilateral upper extremity supported Standing balance-Leahy Scale: Poor Standing balance comment: reliant on RW for support                            Cognition Arousal/Alertness: Awake/alert Behavior During Therapy: WFL for tasks assessed/performed Overall Cognitive Status: Within Functional Limits for tasks assessed                                         Exercises      General Comments        Pertinent Vitals/Pain Pain Assessment: No/denies pain    Home Living                      Prior Function            PT Goals (current goals can now be found in the care plan section) Acute Rehab PT Goals Patient Stated Goal: go home, walker better Progress towards PT goals: Progressing toward goals    Frequency    Min 3X/week      PT Plan Current plan remains appropriate    Co-evaluation              AM-PAC PT "6 Clicks" Mobility   Outcome Measure  Help needed turning from your back to your side while in a flat bed without using bedrails?: A Little Help needed moving from lying on your back to sitting on the side of a flat bed without using bedrails?: A Little Help needed moving to and from a bed to a chair (including a wheelchair)?: None Help needed standing up from a chair using your arms (e.g., wheelchair or bedside chair)?: A Little Help needed to walk in hospital room?: A Little Help needed climbing 3-5 steps with a railing? : A Lot 6 Click Score: 18    End of Session Equipment Utilized During Treatment: Gait belt;Oxygen Activity Tolerance: Patient tolerated treatment well;Patient limited by fatigue Patient left: with call bell/phone within reach;in chair Nurse Communication: Mobility status PT Visit Diagnosis: Difficulty in walking, not elsewhere classified (R26.2);Muscle weakness (generalized) (M62.81)     Time: 0762-2633 PT Time Calculation (min) (ACUTE ONLY): 33 min  Charges:  $Gait Training: 8-22 mins $Self Care/Home Management: 8-22                     Marinna Blane W,PT [place] Pager:  [phone]  Office:  [place] 09/15/2019, 9:38 AM

## 2019-09-15 NOTE — Progress Notes (Signed)
SATURATION QUALIFICATIONS: (This note is used to comply with regulatory documentation for home oxygen)  Patient Saturations on Room Air at Rest = 89%  Patient Saturations on Room Air while Ambulating = 86%  Patient Saturations on 2 Liters of oxygen while Ambulating = 94%  Please briefly explain why patient needs home oxygen:Pt required 2LO2 with activity to keep sats >90%.  Was 89% at rest on RA.  Will follow acutely.   Alexander Mcauley W,PT Acute Rehabilitation Services Pager:  [phone]  Office:  [phone]

## 2019-09-16 LAB — IRON AND TIBC
Iron: 39 ug/dL (ref 28–170)
Saturation Ratios: 11 % (ref 10.4–31.8)
TIBC: 350 ug/dL (ref 250–450)
UIBC: 311 ug/dL

## 2019-09-16 LAB — GLUCOSE, CAPILLARY
Glucose-Capillary: 161 mg/dL — ABNORMAL HIGH (ref 70–99)
Glucose-Capillary: 180 mg/dL — ABNORMAL HIGH (ref 70–99)
Glucose-Capillary: 193 mg/dL — ABNORMAL HIGH (ref 70–99)
Glucose-Capillary: 188 mg/dL — ABNORMAL HIGH (ref 70–99)

## 2019-09-16 LAB — BASIC METABOLIC PANEL
Anion gap: 8 (ref 5–15)
BUN: 56 mg/dL — ABNORMAL HIGH (ref 6–20)
CO2: 28 mmol/L (ref 22–32)
Calcium: 7.9 mg/dL — ABNORMAL LOW (ref 8.9–10.3)
Chloride: 98 mmol/L (ref 98–111)
Creatinine, Ser: 2.01 mg/dL — ABNORMAL HIGH (ref 0.44–1.00)
GFR calc Af Amer: 32 mL/min — ABNORMAL LOW (ref >60)
GFR calc non Af Amer: 28 mL/min — ABNORMAL LOW (ref >60)
Glucose, Bld: 228 mg/dL — ABNORMAL HIGH (ref 70–99)
Potassium: 5.2 mmol/L — ABNORMAL HIGH (ref 3.5–5.1)
Sodium: 134 mmol/L — ABNORMAL LOW (ref 135–145)

## 2019-09-16 LAB — CBC
HCT: 34.7 % — ABNORMAL LOW (ref 36.0–46.0)
Hemoglobin: 10.4 g/dL — ABNORMAL LOW (ref 12.0–15.0)
MCH: 29.1 pg (ref 26.0–34.0)
MCHC: 30 g/dL (ref 30.0–36.0)
MCV: 96.9 fL (ref 80.0–100.0)
Platelets: 189 10*3/uL (ref 150–400)
RBC: 3.58 MIL/uL — ABNORMAL LOW (ref 3.87–5.11)
RDW: 13.4 % (ref 11.5–15.5)
WBC: 5.3 10*3/uL (ref 4.0–10.5)
nRBC: 0 % (ref 0.0–0.2)

## 2019-09-16 LAB — FERRITIN: Ferritin: 34 ng/mL (ref 11–307)

## 2019-09-16 MED ORDER — ISOSORB DINITRATE-HYDRALAZINE 20-37.5 MG PO TABS
1.0000 | ORAL_TABLET | Freq: Three times a day (TID) | ORAL | Status: DC
  Administered 2019-09-16 – 2019-09-19 (×10): 1 via ORAL
  Filled 2019-09-16 (×10): qty 1

## 2019-09-16 MED ORDER — FUROSEMIDE 10 MG/ML IJ SOLN: 60.0000 mg | Freq: Two times a day (BID) | INTRAMUSCULAR | Status: DC

## 2019-09-16 NOTE — Progress Notes (Addendum)
PROGRESS NOTE    VIOLANDA [name]   VQQ:[id number]  DOB: 08/22/1966  DOA: 09/10/2019 PCP: Vonna Drafts, FNP   Brief Narrative:  [name] is a 54 y.o. female with medical history significant of HFrEF with prior EF 31%, DM2, HTN, chronic pain syndrome presenting with dyspnea and hypoxia with pulse ox of 80s on exertion.  She admits to stopping her Lasix after she ran out of it about 1 month ago as well as not taking it properly.    Subjective: Still feels swollen but better overall    Assessment & Plan:     Acute on chronic systolic CHF (congestive heart failure)  - cont IV diuretics with close monitoring of her daily weights and I/Os as well as daily BMPs - she admits to not taking her Lasix regularly prior to running out 1 month ago - last weight in September 157 kg-- came into hospital at 205 kg-- weight slowly lowering to 203 kg- she is having marked diuresis  - d/c entresto -   ECHO shows an EF of 35-40% with grade 1 dCHF -10L of diuresis-- will hold lasix today and monitor diuresis closely - discussed with Dr. Mamie Nick regarding her slowly increased Cr and elevated K: will hold lasix x 24 hours and monitor output, start bidil, in AM pending Cr/K-- will dose with either IV lasix or start PO lasix... stop entresto until follow up with him  Hyperkalemia - started Lokelma BID - hold entresto    Essential hypertension - cont Toprol- may need to lower dose due to hypotension    Diabetes mellitus - Metformin on hold- cont SSI - A1c 8.8     Chronic renal insufficiency, stage 3 - cont to follow while diuresing     Chronic pain syndrome - cont Butrans patch, Neurontin, Percocet- Oxycontin substituted for Xtampza  Nicotine abuse - cont Nicoderm patch  Morbid obesity Estimated body mass index is 79.24 kg/m as calculated from the following:   Height as of this encounter: 5\' 3"  (1.6 m).   Weight as of this encounter: 202.9 kg.  Gout -cont Allopurinol   Time spent  in minutes: 35 min DVT prophylaxis: Lovenox Code Status: Full code Family Communication: aide at bedside Disposition Plan: home in several more days, suspect we have a lot more diuresis to go   Procedures:   2D ECHO 1. Left ventricular ejection fraction, by visual estimation, is 35 to 40%. The left ventricle has moderate to severely decreased function. There is mildly increased left ventricular hypertrophy.  2. Definity contrast agent was given IV to delineate the left ventricular endocardial borders.  3. Left ventricular diastolic parameters are consistent with Grade I diastolic dysfunction (impaired relaxation).  4. The left ventricle demonstrates global hypokinesis.  5. Global right ventricle was not well visualized.The right ventricular size is not well visualized. Right vetricular wall thickness was not assessed.  6. Left atrial size was not well visualized.  7. Right atrial size was not well visualized.  8. The mitral valve is grossly normal. Trivial mitral valve regurgitation.  9. The tricuspid valve is not well visualized. 10. The aortic valve was not well visualized. Aortic valve regurgitation is not visualized. 11. The pulmonic valve was not well visualized. Pulmonic valve regurgitation is not visualized. 12. The aortic root was not well visualized. 13. Moderately elevated pulmonary artery systolic pressure. 14. The inferior vena cava is normal in size with <50% respiratory variability, suggesting right atrial pressure of 8 mmHg. 15. The  interatrial septum was not well visualized.    Objective: Vitals:   09/15/19 2223 09/15/19 2318 09/16/19 0451 09/16/19 1125  BP:  119/65 116/71   Pulse: 85 77 73   Resp: 18 18 18    Temp:  97.8 F (36.6 C) 98.1 F (36.7 C)   TempSrc:  Oral Oral   SpO2: 95% 94% 99%   Weight:   (!) 203.8 kg (!) 202.9 kg  Height:        Intake/Output Summary (Last 24 hours) at 09/16/2019 1228 Last data filed at 09/16/2019 0455 Gross per 24 hour    Intake 720 ml  Output 700 ml  Net 20 ml   Filed Weights   09/15/19 0616 09/16/19 0451 09/16/19 1125  Weight: (!) 203.4 kg (!) 203.8 kg (!) 202.9 kg    Examination: In bed, NAD rrr Diminished breath sounds Morbidly obese +LE edema   Data Reviewed: I have personally reviewed following labs and imaging studies  CBC: Recent Labs  Lab 09/10/19 2341 09/11/19 1030 09/12/19 0511 09/15/19 0534 09/16/19 0207  WBC 6.2 5.9 6.1 4.9 5.3  NEUTROABS 4.4  --   --   --   --   HGB 11.8* 10.5* 10.9* 10.3* 10.4*  HCT 38.4 34.5* 36.6 33.6* 34.7*  MCV 99.0 98.0 98.4 96.6 96.9  PLT 210 181 205 187 572   Basic Metabolic Panel: Recent Labs  Lab 09/12/19 1639 09/13/19 0622 09/14/19 0544 09/15/19 0534 09/16/19 0207  NA 135 137 135 135 134*  K 5.8* 5.5* 5.8* 5.9* 5.2*  CL 102 101 102 101 98  CO2 28 27 25 29 28   GLUCOSE 192* 156* 158* 167* 228*  BUN 50* 51* 50* 52* 56*  CREATININE 1.72* 1.67* 1.80* 1.93* 2.01*  CALCIUM 8.4* 8.3* 8.2* 8.0* 7.9*   GFR: Estimated Creatinine Clearance: 58.9 mL/min (A) (by C-G formula based on SCr of 2.01 mg/dL (H)). Liver Function Tests: Recent Labs  Lab 09/10/19 2341  AST 26  ALT 18  ALKPHOS 150*  BILITOT 0.3  PROT 7.7  ALBUMIN 3.1*   No results for input(s): LIPASE, AMYLASE in the last 168 hours. No results for input(s): AMMONIA in the last 168 hours. Coagulation Profile: No results for input(s): INR, PROTIME in the last 168 hours. Cardiac Enzymes: No results for input(s): CKTOTAL, CKMB, CKMBINDEX, TROPONINI in the last 168 hours. BNP (last 3 results) No results for input(s): PROBNP in the last 8760 hours. HbA1C: No results for input(s): HGBA1C in the last 72 hours. CBG: Recent Labs  Lab 09/15/19 1043 09/15/19 1625 09/15/19 2120 09/16/19 0615 09/16/19 1114  GLUCAP 161* 204* 207* 161* 180*   Lipid Profile: No results for input(s): CHOL, HDL, LDLCALC, TRIG, CHOLHDL, LDLDIRECT in the last 72 hours. Thyroid Function Tests: No  results for input(s): TSH, T4TOTAL, FREET4, T3FREE, THYROIDAB in the last 72 hours. Anemia Panel: Recent Labs    09/16/19 0207  FERRITIN 34  TIBC 350  IRON 39   Urine analysis:    Component Value Date/Time   COLORURINE YELLOW 12/31/2018 0457   APPEARANCEUR CLEAR 12/31/2018 0457   LABSPEC 1.009 12/31/2018 0457   PHURINE 5.0 12/31/2018 0457   GLUCOSEU >=500 (A) 12/31/2018 0457   HGBUR NEGATIVE 12/31/2018 0457   BILIRUBINUR NEGATIVE 12/31/2018 0457   KETONESUR NEGATIVE 12/31/2018 0457   PROTEINUR NEGATIVE 12/31/2018 0457   UROBILINOGEN 1.0 05/16/2013 1758   NITRITE POSITIVE (A) 12/31/2018 0457   LEUKOCYTESUR TRACE (A) 12/31/2018 0457    Recent Results (from the past 240 hour(s))  SARS CORONAVIRUS 2 (TAT 6-24 HRS) Nasopharyngeal Nasopharyngeal Swab     Status: None   Collection Time: 09/11/19  5:57 AM   Specimen: Nasopharyngeal Swab  Result Value Ref Range Status   SARS Coronavirus 2 NEGATIVE NEGATIVE Final    Comment: (NOTE) SARS-CoV-2 target nucleic acids are NOT DETECTED. The SARS-CoV-2 RNA is generally detectable in upper and lower respiratory specimens during the acute phase of infection. Negative results do not preclude SARS-CoV-2 infection, do not rule out co-infections with other pathogens, and should not be used as the sole basis for treatment or other patient management decisions. Negative results must be combined with clinical observations, patient history, and epidemiological information. The expected result is Negative. Fact Sheet for Patients: SugarRoll.be Fact Sheet for Healthcare Providers: https://www.woods-mathews.com/ This test is not yet approved or cleared by the [place] FDA and  has been authorized for detection and/or diagnosis of SARS-CoV-2 by FDA under an Emergency Use Authorization (EUA). This EUA will remain  in effect (meaning this test can be used) for the duration of the COVID-19 declaration  under Section 56 4(b)(1) of the Act, 21 U.S.C. section 360bbb-3(b)(1), unless the authorization is terminated or revoked sooner. Performed at [facility], [place] [address], [place], [place] [address]          Radiology Studies: No results found.    Scheduled Meds: . allopurinol  300 mg Oral Daily  . [START ON 09/17/2019] buprenorphine  1 patch Transdermal Q Sat  . enoxaparin (LOVENOX) injection  100 mg Subcutaneous Q24H  . [START ON 09/17/2019] furosemide  60 mg Intravenous Q12H  . gabapentin  100 mg Oral TID  . insulin aspart  0-15 Units Subcutaneous TID WC  . insulin aspart  0-5 Units Subcutaneous QHS  . isosorbide-hydrALAZINE  1 tablet Oral TID  . metoprolol succinate  37.5 mg Oral Daily  . nicotine  21 mg Transdermal Daily  . oxyCODONE  40 mg Oral BID  . senna-docusate  1 tablet Oral QHS  . sodium chloride flush  3 mL Intravenous Q12H  . sodium zirconium cyclosilicate  10 g Oral BID   Continuous Infusions: . sodium chloride Stopped (09/11/19 0657)     LOS: 5 days      Geradine Girt, DO [facility]  [email or web address] Password [facility] 09/16/2019, 12:28 PM

## 2019-09-17 LAB — BASIC METABOLIC PANEL
Anion gap: 8 (ref 5–15)
BUN: 65 mg/dL — ABNORMAL HIGH (ref 6–20)
CO2: 27 mmol/L (ref 22–32)
Calcium: 7.8 mg/dL — ABNORMAL LOW (ref 8.9–10.3)
Chloride: 100 mmol/L (ref 98–111)
Creatinine, Ser: 2.23 mg/dL — ABNORMAL HIGH (ref 0.44–1.00)
GFR calc Af Amer: 29 mL/min — ABNORMAL LOW (ref >60)
GFR calc non Af Amer: 25 mL/min — ABNORMAL LOW (ref >60)
Glucose, Bld: 178 mg/dL — ABNORMAL HIGH (ref 70–99)
Potassium: 5.9 mmol/L — ABNORMAL HIGH (ref 3.5–5.1)
Sodium: 135 mmol/L (ref 135–145)

## 2019-09-17 LAB — CBC
HCT: 34.1 % — ABNORMAL LOW (ref 36.0–46.0)
Hemoglobin: 10.3 g/dL — ABNORMAL LOW (ref 12.0–15.0)
MCH: 29.5 pg (ref 26.0–34.0)
MCHC: 30.2 g/dL (ref 30.0–36.0)
MCV: 97.7 fL (ref 80.0–100.0)
Platelets: 202 10*3/uL (ref 150–400)
RBC: 3.49 MIL/uL — ABNORMAL LOW (ref 3.87–5.11)
RDW: 13.6 % (ref 11.5–15.5)
WBC: 6.2 10*3/uL (ref 4.0–10.5)
nRBC: 0 % (ref 0.0–0.2)

## 2019-09-17 LAB — GLUCOSE, CAPILLARY
Glucose-Capillary: 142 mg/dL — ABNORMAL HIGH (ref 70–99)
Glucose-Capillary: 183 mg/dL — ABNORMAL HIGH (ref 70–99)
Glucose-Capillary: 181 mg/dL — ABNORMAL HIGH (ref 70–99)
Glucose-Capillary: 155 mg/dL — ABNORMAL HIGH (ref 70–99)

## 2019-09-17 MED ORDER — ZINC OXIDE 12.8 % EX OINT
TOPICAL_OINTMENT | CUTANEOUS | Status: DC | PRN
  Filled 2019-09-17: qty 56.7

## 2019-09-17 MED ORDER — FUROSEMIDE 10 MG/ML IJ SOLN
40.0000 mg | Freq: Once | INTRAMUSCULAR | Status: AC
  Administered 2019-09-17: 40 mg via INTRAVENOUS
  Filled 2019-09-17: qty 4

## 2019-09-17 MED ORDER — BIOTENE DRY MOUTH MT LIQD: 15.0000 mL | OROMUCOSAL | Status: DC | PRN

## 2019-09-17 NOTE — Progress Notes (Signed)
PROGRESS NOTE    [name]   TIR:[id number]  DOB: 08/24/1966  DOA: 09/10/2019 PCP: Vonna Drafts, FNP   Brief Narrative:  [name] is a 54 y.o. female with medical history significant of HFrEF with prior EF 31%, DM2, HTN, chronic pain syndrome presenting with dyspnea and hypoxia with pulse ox of 80s on exertion.  She admits to stopping her Lasix after she ran out of it about 1 month ago as well as not taking it properly.    Subjective: Was eating potatoe chips as well as drinking orange juice    Assessment & Plan:     Acute on chronic systolic CHF (congestive heart failure)  - cont IV diuretics with close monitoring of her daily weights and I/Os as well as daily BMPs - she admits to not taking her Lasix regularly prior to running out 1 month ago - last weight in September 157 kg-- came into hospital at 205 kg-- weight slowly lowering to 203 kg- she is having marked diuresis when getting the lasix but during the lasix holiday she has minimal output  - d/c entresto -   ECHO shows an EF of 35-40% with grade 1 dCHF -10L of diuresis-- will hold lasix today and monitor diuresis closely - discussed with Dr. Mamie Nick regarding her slowly increased Cr and elevated K: will hold lasix x 24 hours and monitor output, start bidil, in AM pending Cr/K-- will dose with either IV lasix or start PO lasix... stop entresto until follow up with him -CR continues to elevate despite the changes-- will dose lasix today and monitor labs in the AM  Hyperkalemia - started Lokelma BID - hold entresto -changed diet to renal (was drinking OJ and eating potatoes)    Essential hypertension - cont Toprol- may need to lower dose due to hypotension    Diabetes mellitus - Metformin on hold- cont SSI - A1c 8.8     Chronic renal insufficiency, stage 3 - cont to follow while diuresing     Chronic pain syndrome - cont Butrans patch, Neurontin, Percocet- Oxycontin substituted for Xtampza  Nicotine  abuse - cont Nicoderm patch  Morbid obesity Estimated body mass index is 80.05 kg/m as calculated from the following:   Height as of this encounter: 5\' 3"  (1.6 m).   Weight as of this encounter: 205 kg.  Gout -cont Allopurinol   Time spent in minutes: 35 min DVT prophylaxis: Lovenox Code Status: Full code Family Communication: aide at bedside 12/31 Disposition Plan: home in several more days, suspect we have a lot more diuresis to go   Procedures:   2D ECHO 1. Left ventricular ejection fraction, by visual estimation, is 35 to 40%. The left ventricle has moderate to severely decreased function. There is mildly increased left ventricular hypertrophy.  2. Definity contrast agent was given IV to delineate the left ventricular endocardial borders.  3. Left ventricular diastolic parameters are consistent with Grade I diastolic dysfunction (impaired relaxation).  4. The left ventricle demonstrates global hypokinesis.  5. Global right ventricle was not well visualized.The right ventricular size is not well visualized. Right vetricular wall thickness was not assessed.  6. Left atrial size was not well visualized.  7. Right atrial size was not well visualized.  8. The mitral valve is grossly normal. Trivial mitral valve regurgitation.  9. The tricuspid valve is not well visualized. 10. The aortic valve was not well visualized. Aortic valve regurgitation is not visualized. 11. The pulmonic valve was not  well visualized. Pulmonic valve regurgitation is not visualized. 12. The aortic root was not well visualized. 13. Moderately elevated pulmonary artery systolic pressure. 14. The inferior vena cava is normal in size with <50% respiratory variability, suggesting right atrial pressure of 8 mmHg. 15. The interatrial septum was not well visualized.    Objective: Vitals:   09/16/19 2247 09/16/19 2322 09/17/19 0635 09/17/19 1159  BP:  (!) 108/59 135/74 110/61  Pulse: 75 75 71 77  Resp: 18  18  16   Temp:  98.1 F (36.7 C) 98.5 F (36.9 C) (!) 97.5 F (36.4 C)  TempSrc:  Oral Oral Oral  SpO2: 92% 95% 92% 95%  Weight:   (!) 205 kg   Height:        Intake/Output Summary (Last 24 hours) at 09/17/2019 1457 Last data filed at 09/17/2019 1133 Gross per 24 hour  Intake 798 ml  Output 500 ml  Net 298 ml   Filed Weights   09/16/19 0451 09/16/19 1125 09/17/19 0635  Weight: (!) 203.8 kg (!) 202.9 kg (!) 205 kg    Examination: In chair, difficult to assess JVD due to size +LE edema + edema in outer and inner thighs Pannus easily moved   Data Reviewed: I have personally reviewed following labs and imaging studies  CBC: Recent Labs  Lab 09/10/19 2341 09/11/19 1030 09/12/19 0511 09/15/19 0534 09/16/19 0207 09/17/19 0455  WBC 6.2 5.9 6.1 4.9 5.3 6.2  NEUTROABS 4.4  --   --   --   --   --   HGB 11.8* 10.5* 10.9* 10.3* 10.4* 10.3*  HCT 38.4 34.5* 36.6 33.6* 34.7* 34.1*  MCV 99.0 98.0 98.4 96.6 96.9 97.7  PLT 210 181 205 187 189 993   Basic Metabolic Panel: Recent Labs  Lab 09/13/19 0622 09/14/19 0544 09/15/19 0534 09/16/19 0207 09/17/19 0455  NA 137 135 135 134* 135  K 5.5* 5.8* 5.9* 5.2* 5.9*  CL 101 102 101 98 100  CO2 27 25 29 28 27   GLUCOSE 156* 158* 167* 228* 178*  BUN 51* 50* 52* 56* 65*  CREATININE 1.67* 1.80* 1.93* 2.01* 2.23*  CALCIUM 8.3* 8.2* 8.0* 7.9* 7.8*   GFR: Estimated Creatinine Clearance: 53.4 mL/min (A) (by C-G formula based on SCr of 2.23 mg/dL (H)). Liver Function Tests: Recent Labs  Lab 09/10/19 2341  AST 26  ALT 18  ALKPHOS 150*  BILITOT 0.3  PROT 7.7  ALBUMIN 3.1*   No results for input(s): LIPASE, AMYLASE in the last 168 hours. No results for input(s): AMMONIA in the last 168 hours. Coagulation Profile: No results for input(s): INR, PROTIME in the last 168 hours. Cardiac Enzymes: No results for input(s): CKTOTAL, CKMB, CKMBINDEX, TROPONINI in the last 168 hours. BNP (last 3 results) No results for input(s): PROBNP in  the last 8760 hours. HbA1C: No results for input(s): HGBA1C in the last 72 hours. CBG: Recent Labs  Lab 09/16/19 1114 09/16/19 1624 09/16/19 2124 09/17/19 0633 09/17/19 1111  GLUCAP 180* 193* 188* 142* 183*   Lipid Profile: No results for input(s): CHOL, HDL, LDLCALC, TRIG, CHOLHDL, LDLDIRECT in the last 72 hours. Thyroid Function Tests: No results for input(s): TSH, T4TOTAL, FREET4, T3FREE, THYROIDAB in the last 72 hours. Anemia Panel: Recent Labs    09/16/19 0207  FERRITIN 34  TIBC 350  IRON 39   Urine analysis:    Component Value Date/Time   COLORURINE YELLOW 12/31/2018 [place] 12/31/2018 0457   LABSPEC 1.009 12/31/2018 0457  PHURINE 5.0 12/31/2018 0457   GLUCOSEU >=500 (A) 12/31/2018 0457   HGBUR NEGATIVE 12/31/2018 0457   BILIRUBINUR NEGATIVE 12/31/2018 0457   KETONESUR NEGATIVE 12/31/2018 0457   PROTEINUR NEGATIVE 12/31/2018 0457   UROBILINOGEN 1.0 05/16/2013 1758   NITRITE POSITIVE (A) 12/31/2018 0457   LEUKOCYTESUR TRACE (A) 12/31/2018 0457    Recent Results (from the past 240 hour(s))  SARS CORONAVIRUS 2 (TAT 6-24 HRS) Nasopharyngeal Nasopharyngeal Swab     Status: None   Collection Time: 09/11/19  5:57 AM   Specimen: Nasopharyngeal Swab  Result Value Ref Range Status   SARS Coronavirus 2 NEGATIVE NEGATIVE Final    Comment: (NOTE) SARS-CoV-2 target nucleic acids are NOT DETECTED. The SARS-CoV-2 RNA is generally detectable in upper and lower respiratory specimens during the acute phase of infection. Negative results do not preclude SARS-CoV-2 infection, do not rule out co-infections with other pathogens, and should not be used as the sole basis for treatment or other patient management decisions. Negative results must be combined with clinical observations, patient history, and epidemiological information. The expected result is Negative. Fact Sheet for Patients: SugarRoll.be Fact Sheet for Healthcare  Providers: https://www.woods-mathews.com/ This test is not yet approved or cleared by the [place] FDA and  has been authorized for detection and/or diagnosis of SARS-CoV-2 by FDA under an Emergency Use Authorization (EUA). This EUA will remain  in effect (meaning this test can be used) for the duration of the COVID-19 declaration under Section 56 4(b)(1) of the Act, 21 U.S.C. section 360bbb-3(b)(1), unless the authorization is terminated or revoked sooner. Performed at [facility], [place] [address] [place], [place] [address]          Radiology Studies: No results found.    Scheduled Meds: . allopurinol  300 mg Oral Daily  . buprenorphine  1 patch Transdermal Q Sat  . enoxaparin (LOVENOX) injection  100 mg Subcutaneous Q24H  . furosemide  40 mg Intravenous Once  . gabapentin  100 mg Oral TID  . insulin aspart  0-15 Units Subcutaneous TID WC  . insulin aspart  0-5 Units Subcutaneous QHS  . isosorbide-hydrALAZINE  1 tablet Oral TID  . metoprolol succinate  37.5 mg Oral Daily  . nicotine  21 mg Transdermal Daily  . oxyCODONE  40 mg Oral BID  . senna-docusate  1 tablet Oral QHS  . sodium chloride flush  3 mL Intravenous Q12H  . sodium zirconium cyclosilicate  10 g Oral BID   Continuous Infusions: . sodium chloride Stopped (09/11/19 0657)     LOS: 6 days      Carrie Girt, DO [facility]  [email or web address] Password [facility] 09/17/2019, 2:57 PM

## 2019-09-18 ENCOUNTER — Other Ambulatory Visit: Payer: Self-pay | Admitting: Cardiology

## 2019-09-18 DIAGNOSIS — I5022 Chronic systolic (congestive) heart failure: Secondary | ICD-10-CM

## 2019-09-18 LAB — BASIC METABOLIC PANEL
Anion gap: 7 (ref 5–15)
BUN: 67 mg/dL — ABNORMAL HIGH (ref 6–20)
CO2: 28 mmol/L (ref 22–32)
Calcium: 7.8 mg/dL — ABNORMAL LOW (ref 8.9–10.3)
Chloride: 98 mmol/L (ref 98–111)
Creatinine, Ser: 1.99 mg/dL — ABNORMAL HIGH (ref 0.44–1.00)
GFR calc Af Amer: 33 mL/min — ABNORMAL LOW (ref >60)
GFR calc non Af Amer: 28 mL/min — ABNORMAL LOW (ref >60)
Glucose, Bld: 239 mg/dL — ABNORMAL HIGH (ref 70–99)
Potassium: 5.4 mmol/L — ABNORMAL HIGH (ref 3.5–5.1)
Sodium: 133 mmol/L — ABNORMAL LOW (ref 135–145)

## 2019-09-18 LAB — GLUCOSE, CAPILLARY
Glucose-Capillary: 190 mg/dL — ABNORMAL HIGH (ref 70–99)
Glucose-Capillary: 150 mg/dL — ABNORMAL HIGH (ref 70–99)
Glucose-Capillary: 167 mg/dL — ABNORMAL HIGH (ref 70–99)
Glucose-Capillary: 180 mg/dL — ABNORMAL HIGH (ref 70–99)

## 2019-09-18 LAB — CBC
HCT: 35.9 % — ABNORMAL LOW (ref 36.0–46.0)
Hemoglobin: 10.7 g/dL — ABNORMAL LOW (ref 12.0–15.0)
MCH: 29.2 pg (ref 26.0–34.0)
MCHC: 29.8 g/dL — ABNORMAL LOW (ref 30.0–36.0)
MCV: 97.8 fL (ref 80.0–100.0)
Platelets: 193 10*3/uL (ref 150–400)
RBC: 3.67 MIL/uL — ABNORMAL LOW (ref 3.87–5.11)
RDW: 13.4 % (ref 11.5–15.5)
WBC: 6 10*3/uL (ref 4.0–10.5)
nRBC: 0 % (ref 0.0–0.2)

## 2019-09-18 MED ORDER — FUROSEMIDE 10 MG/ML IJ SOLN
40.0000 mg | Freq: Two times a day (BID) | INTRAMUSCULAR | Status: DC
  Administered 2019-09-18 – 2019-09-19 (×3): 40 mg via INTRAVENOUS
  Filled 2019-09-18 (×3): qty 4

## 2019-09-18 MED ORDER — LINAGLIPTIN 5 MG PO TABS
5.0000 mg | ORAL_TABLET | Freq: Every day | ORAL | Status: DC
  Administered 2019-09-18 – 2019-09-19 (×2): 5 mg via ORAL
  Filled 2019-09-18 (×2): qty 1

## 2019-09-18 MED ORDER — OXYCODONE-ACETAMINOPHEN 5-325 MG PO TABS
1.0000 | ORAL_TABLET | Freq: Four times a day (QID) | ORAL | Status: DC | PRN
  Administered 2019-09-18 – 2019-09-19 (×3): 1 via ORAL
  Filled 2019-09-18 (×3): qty 1

## 2019-09-18 MED ORDER — OXYCODONE HCL 5 MG PO TABS
5.0000 mg | ORAL_TABLET | Freq: Four times a day (QID) | ORAL | Status: DC | PRN
  Administered 2019-09-18 – 2019-09-19 (×3): 5 mg via ORAL
  Filled 2019-09-18 (×3): qty 1

## 2019-09-18 NOTE — Progress Notes (Signed)
Patient refused CPAP for the night. Sp02=94% on room air, will continue to monitor patient.

## 2019-09-18 NOTE — Progress Notes (Signed)
Physical Therapy Treatment Patient Details Name: [name] MRN: [id number] DOB: 1966/08/26 Today's Date: 09/18/2019    History of Present Illness 54 yo female admitted to ED on 12/26 with CHF exacerbation, with fluid retention and shortness of breath requiring supplemental O2. PMH includes HFrEF with prior EF of 31%, DMII with neuropathy, HTN, chronic pain, cardiomyopathy, bilateral TKR with revision, ACDF 2011, lumbar laminectomy.    PT Comments    Pt seems to be functioning fairly close to baseline as prior to admission she was a limited household ambulator. Session focused on functional mobility to bedside commode and then to recliner. Pt requiring supervision for ambulation with use of walker. Needs total assist for peri care (typically has assist from aide or daughter). SpO2 91-92% on RA throughout. Would benefit from HHPT at discharge for progression of cardiopulmonary endurance.     Follow Up Recommendations  Home health PT;Supervision for mobility/OOB     Equipment Recommendations  3in1 (PT)(bariatric)    Recommendations for Other Services       Precautions / Restrictions Precautions Precautions: Fall Restrictions Weight Bearing Restrictions: No    Mobility  Bed Mobility Overal bed mobility: Needs Assistance Bed Mobility: Supine to Sit     Supine to sit: Min assist;HOB elevated     General bed mobility comments: MinA for RLE negotiation out of bed; use of bed pad to scoot right hip forward. pt with heavy reliance pushing on bed rail  Transfers Overall transfer level: Needs assistance Equipment used: Rolling walker (2 wheeled) Transfers: Sit to/from Stand Sit to Stand: Supervision         General transfer comment: No assist to power up from edge of bed and [facility]  Ambulation/Gait Ambulation/Gait assistance: Supervision Gait Distance (Feet): 10 Feet(5, 5) Assistive device: Rolling walker (2 wheeled) Gait Pattern/deviations: Step-through pattern;Decreased  stride length;Trunk flexed;Wide base of support Gait velocity: decreased Gait velocity interpretation: <1.31 ft/sec, indicative of household ambulator General Gait Details: Pt maneuvering without physical assist, very cautious and slow pace, tendency for wide BOS and feet external rotation due to increased body habitus.    Stairs             Wheelchair Mobility    Modified Rankin (Stroke Patients Only)       Balance Overall balance assessment: Needs assistance;History of Falls Sitting-balance support: No upper extremity supported;Feet supported Sitting balance-Leahy Scale: Good     Standing balance support: Bilateral upper extremity supported Standing balance-Leahy Scale: Poor Standing balance comment: reliant on RW for support                            Cognition Arousal/Alertness: Awake/alert Behavior During Therapy: WFL for tasks assessed/performed Overall Cognitive Status: Within Functional Limits for tasks assessed                                        Exercises      General Comments        Pertinent Vitals/Pain Pain Assessment: Faces Faces Pain Scale: No hurt    Home Living                      Prior Function            PT Goals (current goals can now be found in the care plan section) Acute Rehab PT Goals Patient Stated  Goal: go home, walker better Potential to Achieve Goals: Good Progress towards PT goals: Progressing toward goals    Frequency    Min 3X/week      PT Plan Current plan remains appropriate    Co-evaluation              AM-PAC PT "6 Clicks" Mobility   Outcome Measure  Help needed turning from your back to your side while in a flat bed without using bedrails?: A Little Help needed moving from lying on your back to sitting on the side of a flat bed without using bedrails?: A Little Help needed moving to and from a bed to a chair (including a wheelchair)?: None Help needed  standing up from a chair using your arms (e.g., wheelchair or bedside chair)?: A Little Help needed to walk in hospital room?: A Little Help needed climbing 3-5 steps with a railing? : A Lot 6 Click Score: 18    End of Session Equipment Utilized During Treatment: Gait belt Activity Tolerance: Patient tolerated treatment well;Patient limited by fatigue Patient left: with call bell/phone within reach;in chair Nurse Communication: Mobility status PT Visit Diagnosis: Difficulty in walking, not elsewhere classified (R26.2);Muscle weakness (generalized) (M62.81)     Time: 1916-6060 PT Time Calculation (min) (ACUTE ONLY): 39 min  Charges:  $Therapeutic Activity: 38-52 mins                     Ellamae Sia, [place], DPT Acute Rehabilitation Services Pager [phone] Office [phone]    Willy Eddy 09/18/2019, 11:29 AM

## 2019-09-18 NOTE — Progress Notes (Signed)
PROGRESS NOTE    [name]   OHY:[id number]  DOB: March 08, 1966  DOA: 09/10/2019 PCP: Vonna Drafts, FNP   Brief Narrative:  [name] is a 54 y.o. female with medical history significant of HFrEF with prior EF 31%, DM2, HTN, chronic pain syndrome presenting with dyspnea and hypoxia with pulse ox of 80s on exertion.  She admits to stopping her Lasix after she ran out of it about 1 month ago as well as not taking it properly.    Subjective: Feeling better today    Assessment & Plan:     Acute on chronic systolic CHF (congestive heart failure)  - cont IV diuretics with close monitoring of her daily weights and I/Os as well as daily BMPs - she admits to not taking her Lasix regularly prior to running out 1 month ago - last weight in September 157 kg-- came into hospital at 205 kg-- weight slowly lowering to 203 kg- she is having marked diuresis when getting the lasix but during the lasix holiday she has minimal output  - d/c entresto due to hyperkalemia -   ECHO shows an EF of 35-40% with grade 1 dCHF -10L of diuresis-- will hold lasix today and monitor diuresis closely - discussed with Dr. Mamie Nick regarding her slowly increased Cr and elevated K: will hold lasix x 24 hours and monitor output, start bidil, in AM pending Cr/K-- will dose with either IV lasix or start PO lasix... stop entresto until follow up with him -given lasix x 2 1/2 -gave another 2 doses on 1/3 as prior doses improved her Cr  Hyperkalemia - started Lokelma BID - hold entresto -changed diet to renal (was drinking OJ and eating potatoes)    Essential hypertension - cont Toprol- may need to lower dose due to hypotension    Diabetes mellitus - Metformin on hold- cont SSI - A1c 8.8     Chronic renal insufficiency, stage 3 - cont to follow while diuresing     Chronic pain syndrome - cont Butrans patch, Neurontin, Percocet- Oxycontin substituted for Xtampza  Nicotine abuse - cont Nicoderm  patch  Morbid obesity Estimated body mass index is 80.25 kg/m as calculated from the following:   Height as of this encounter: 5\' 3"  (1.6 m).   Weight as of this encounter: 205.5 kg.  Gout -cont Allopurinol   Time spent in minutes: 35 min DVT prophylaxis: Lovenox Code Status: Full code Family Communication: aide at bedside 12/31 Disposition Plan: home in AM with close cardiology follow up   Procedures:   2D ECHO 1. Left ventricular ejection fraction, by visual estimation, is 35 to 40%. The left ventricle has moderate to severely decreased function. There is mildly increased left ventricular hypertrophy.  2. Definity contrast agent was given IV to delineate the left ventricular endocardial borders.  3. Left ventricular diastolic parameters are consistent with Grade I diastolic dysfunction (impaired relaxation).  4. The left ventricle demonstrates global hypokinesis.  5. Global right ventricle was not well visualized.The right ventricular size is not well visualized. Right vetricular wall thickness was not assessed.  6. Left atrial size was not well visualized.  7. Right atrial size was not well visualized.  8. The mitral valve is grossly normal. Trivial mitral valve regurgitation.  9. The tricuspid valve is not well visualized. 10. The aortic valve was not well visualized. Aortic valve regurgitation is not visualized. 11. The pulmonic valve was not well visualized. Pulmonic valve regurgitation is not visualized. 12. The  aortic root was not well visualized. 13. Moderately elevated pulmonary artery systolic pressure. 14. The inferior vena cava is normal in size with <50% respiratory variability, suggesting right atrial pressure of 8 mmHg. 15. The interatrial septum was not well visualized.    Objective: Vitals:   09/17/19 1747 09/17/19 2039 09/18/19 0611 09/18/19 1026  BP: (!) 101/59 137/86 130/90 111/64  Pulse: 71 77 67 73  Resp: 18 20 18 16   Temp: 98.1 F (36.7 C) 98 F  (36.7 C) (!) 97.3 F (36.3 C) (!) 97.5 F (36.4 C)  TempSrc: Oral Oral Oral Axillary  SpO2: 96% 93% 95% 93%  Weight:   (!) 205.5 kg   Height:        Intake/Output Summary (Last 24 hours) at 09/18/2019 1449 Last data filed at 09/18/2019 1230 Gross per 24 hour  Intake 480 ml  Output 1400 ml  Net -920 ml   Filed Weights   09/16/19 1125 09/17/19 0635 09/18/19 0611  Weight: (!) 202.9 kg (!) 205 kg (!) 205.5 kg    Examination: Sitting in chair, no acute distress Few expiratory wheezes, not on oxygen Regular rate and rhythm Positive lower extremity edema Alert and oriented x3   Data Reviewed: I have personally reviewed following labs and imaging studies  CBC: Recent Labs  Lab 09/12/19 0511 09/15/19 0534 09/16/19 0207 09/17/19 0455 09/18/19 0123  WBC 6.1 4.9 5.3 6.2 6.0  HGB 10.9* 10.3* 10.4* 10.3* 10.7*  HCT 36.6 33.6* 34.7* 34.1* 35.9*  MCV 98.4 96.6 96.9 97.7 97.8  PLT 205 187 189 202 269   Basic Metabolic Panel: Recent Labs  Lab 09/14/19 0544 09/15/19 0534 09/16/19 0207 09/17/19 0455 09/18/19 0123  NA 135 135 134* 135 133*  K 5.8* 5.9* 5.2* 5.9* 5.4*  CL 102 101 98 100 98  CO2 25 29 28 27 28   GLUCOSE 158* 167* 228* 178* 239*  BUN 50* 52* 56* 65* 67*  CREATININE 1.80* 1.93* 2.01* 2.23* 1.99*  CALCIUM 8.2* 8.0* 7.9* 7.8* 7.8*   GFR: Estimated Creatinine Clearance: 60 mL/min (A) (by C-G formula based on SCr of 1.99 mg/dL (H)). Liver Function Tests: No results for input(s): AST, ALT, ALKPHOS, BILITOT, PROT, ALBUMIN in the last 168 hours. No results for input(s): LIPASE, AMYLASE in the last 168 hours. No results for input(s): AMMONIA in the last 168 hours. Coagulation Profile: No results for input(s): INR, PROTIME in the last 168 hours. Cardiac Enzymes: No results for input(s): CKTOTAL, CKMB, CKMBINDEX, TROPONINI in the last 168 hours. BNP (last 3 results) No results for input(s): PROBNP in the last 8760 hours. HbA1C: No results for input(s): HGBA1C in  the last 72 hours. CBG: Recent Labs  Lab 09/17/19 1111 09/17/19 1610 09/17/19 2051 09/18/19 0612 09/18/19 1121  GLUCAP 183* 181* 155* 190* 150*   Lipid Profile: No results for input(s): CHOL, HDL, LDLCALC, TRIG, CHOLHDL, LDLDIRECT in the last 72 hours. Thyroid Function Tests: No results for input(s): TSH, T4TOTAL, FREET4, T3FREE, THYROIDAB in the last 72 hours. Anemia Panel: Recent Labs    09/16/19 0207  FERRITIN 34  TIBC 350  IRON 39   Urine analysis:    Component Value Date/Time   COLORURINE YELLOW 12/31/2018 [place] 12/31/2018 0457   LABSPEC 1.009 12/31/2018 0457   PHURINE 5.0 12/31/2018 0457   GLUCOSEU >=500 (A) 12/31/2018 [place] NEGATIVE 12/31/2018 [place] 12/31/2018 [place] 12/31/2018 [place] NEGATIVE 12/31/2018 0457  UROBILINOGEN 1.0 05/16/2013 1758   NITRITE POSITIVE (A) 12/31/2018 0457   LEUKOCYTESUR TRACE (A) 12/31/2018 0457    Recent Results (from the past 240 hour(s))  SARS CORONAVIRUS 2 (TAT 6-24 HRS) Nasopharyngeal Nasopharyngeal Swab     Status: None   Collection Time: 09/11/19  5:57 AM   Specimen: Nasopharyngeal Swab  Result Value Ref Range Status   SARS Coronavirus 2 NEGATIVE NEGATIVE Final    Comment: (NOTE) SARS-CoV-2 target nucleic acids are NOT DETECTED. The SARS-CoV-2 RNA is generally detectable in upper and lower respiratory specimens during the acute phase of infection. Negative results do not preclude SARS-CoV-2 infection, do not rule out co-infections with other pathogens, and should not be used as the sole basis for treatment or other patient management decisions. Negative results must be combined with clinical observations, patient history, and epidemiological information. The expected result is Negative. Fact Sheet for Patients: SugarRoll.be Fact Sheet for Healthcare Providers: https://www.woods-mathews.com/ This test  is not yet approved or cleared by the [place] FDA and  has been authorized for detection and/or diagnosis of SARS-CoV-2 by FDA under an Emergency Use Authorization (EUA). This EUA will remain  in effect (meaning this test can be used) for the duration of the COVID-19 declaration under Section 56 4(b)(1) of the Act, 21 U.S.C. section 360bbb-3(b)(1), unless the authorization is terminated or revoked sooner. Performed at [facility], [place] [address], [place], [place] [address]          Radiology Studies: No results found.    Scheduled Meds: . allopurinol  300 mg Oral Daily  . buprenorphine  1 patch Transdermal Q Sat  . enoxaparin (LOVENOX) injection  100 mg Subcutaneous Q24H  . furosemide  40 mg Intravenous BID  . gabapentin  100 mg Oral TID  . insulin aspart  0-15 Units Subcutaneous TID WC  . insulin aspart  0-5 Units Subcutaneous QHS  . isosorbide-hydrALAZINE  1 tablet Oral TID  . linagliptin  5 mg Oral Daily  . metoprolol succinate  37.5 mg Oral Daily  . nicotine  21 mg Transdermal Daily  . oxyCODONE  40 mg Oral BID  . senna-docusate  1 tablet Oral QHS  . sodium chloride flush  3 mL Intravenous Q12H  . sodium zirconium cyclosilicate  10 g Oral BID   Continuous Infusions: . sodium chloride Stopped (09/11/19 0657)     LOS: 7 days      Geradine Girt, DO [facility]  [email or web address] Password [facility] 09/18/2019, 2:49 PM

## 2019-09-19 LAB — BASIC METABOLIC PANEL
Anion gap: 9 (ref 5–15)
BUN: 65 mg/dL — ABNORMAL HIGH (ref 6–20)
CO2: 29 mmol/L (ref 22–32)
Calcium: 8.4 mg/dL — ABNORMAL LOW (ref 8.9–10.3)
Chloride: 100 mmol/L (ref 98–111)
Creatinine, Ser: 1.98 mg/dL — ABNORMAL HIGH (ref 0.44–1.00)
GFR calc Af Amer: 33 mL/min — ABNORMAL LOW (ref >60)
GFR calc non Af Amer: 29 mL/min — ABNORMAL LOW (ref >60)
Glucose, Bld: 184 mg/dL — ABNORMAL HIGH (ref 70–99)
Potassium: 5.4 mmol/L — ABNORMAL HIGH (ref 3.5–5.1)
Sodium: 138 mmol/L (ref 135–145)

## 2019-09-19 LAB — GLUCOSE, CAPILLARY
Glucose-Capillary: 178 mg/dL — ABNORMAL HIGH (ref 70–99)
Glucose-Capillary: 173 mg/dL — ABNORMAL HIGH (ref 70–99)
Glucose-Capillary: 159 mg/dL — ABNORMAL HIGH (ref 70–99)

## 2019-09-19 MED ORDER — SODIUM ZIRCONIUM CYCLOSILICATE 10 G PO PACK: 10.0000 g | PACK | Freq: Two times a day (BID) | ORAL | 0 refills | Status: DC

## 2019-09-19 MED ORDER — NICOTINE 21 MG/24HR TD PT24: 21.0000 mg | MEDICATED_PATCH | Freq: Every day | TRANSDERMAL | 0 refills | Status: DC

## 2019-09-19 MED ORDER — ISOSORB DINITRATE-HYDRALAZINE 20-37.5 MG PO TABS: 1.0000 | ORAL_TABLET | Freq: Three times a day (TID) | ORAL | 0 refills | Status: DC

## 2019-09-19 MED ORDER — SODIUM ZIRCONIUM CYCLOSILICATE 10 G PO PACK: 10.0000 g | PACK | Freq: Every day | ORAL | 0 refills | Status: DC

## 2019-09-19 MED ORDER — LINAGLIPTIN 5 MG PO TABS: 5.0000 mg | ORAL_TABLET | Freq: Every day | ORAL | 0 refills | Status: DC

## 2019-09-19 MED ORDER — FUROSEMIDE 40 MG PO TABS: 40.0000 mg | ORAL_TABLET | Freq: Two times a day (BID) | ORAL | 0 refills | Status: AC

## 2019-09-19 MED FILL — TRADJENTA 5 MG TABLET: 5 | 30 days supply | Qty: 30 | Fill #0

## 2019-09-19 MED FILL — FUROSEMIDE 40 MG TABLET: 40 | 30 days supply | Qty: 60 | Fill #0

## 2019-09-19 MED FILL — LOKELMA 10 GM PACK: 10 | 15 days supply | Qty: 15 | Fill #0

## 2019-09-19 MED FILL — BIDIL 20-37.5 MG TABS: 20-37.5 | 30 days supply | Qty: 90 | Fill #0

## 2019-09-19 NOTE — Consult Note (Signed)
   [facility] CM Inpatient Consult   09/19/2019  PRESLYNN BIER [birth year] [id number]   11:10 am 09/20/2019  Spoke with patient this morning, HIPAA verified regarding unable to restart care management services.  Patient was listed with Dr. Benito Mccreedy, in which she states, "I have not been to him in 'years' and reconfirm her primary care providers to be with [facility].  Explained that this is a provider not currently in the [employer].  Encouraged her to change her information regarding her provider with her insurance company, [employer].  Also, encouraged her to reach out to them regarding programs available to her in their network.  She verbalized understanding and states she will update her information with her insurance company.  09/19/2019:  Patient assessed for restart of care needs in the [place].  Primary Care Provider:  Vonna Drafts, FNP, [facility], this provider is not listed as an Equities trader. Spoke with the patient via hospital phone, HIPAA confirmed,  and confirmed that this is her new provider.   Patient was previously active with [facility].     Unfortunately, this provider is not a provider currently attributed to [employer].    For additional questions or referrals please contact:    For questions, please call:  Natividad Brood, RN BSN [facility] Liaison  [phone] business mobile phone Toll free office [phone]  Fax number: [phone] [email or web address]

## 2019-09-19 NOTE — Social Work (Signed)
CSW alerted per RN that pt prefers dc home via PTAR.  PTAR scheduled for 1:30pm pick up, let RN know that pt family or friends would need to come collect pt DME as it cannot be transported with her via PTAR at this time.   CSW signing off. Please consult if any additional needs arise.  Alexander Mt, [place] Work

## 2019-09-19 NOTE — Care Management Important Message (Signed)
Important Message  Patient Details  Name: [name] MRN: [id number] Date of Birth: 1966/09/06   Medicare Important Message Given:  Yes     [name] 09/19/2019, 3:51 PM

## 2019-09-19 NOTE — Progress Notes (Signed)
[name] to be discharged home per MD order. Discussed prescriptions and follow up appointments with the patient. Prescriptions given to patient; medication list explained in detail. Patient verbalized understanding.  Skin clean, dry and intact without evidence of skin break down, no evidence of skin tears noted. IV catheter discontinued intact. Site without signs and symptoms of complications. Dressing and pressure applied. Pt denies pain at the site currently. No complaints noted.  Patient free of lines, drains, and wounds.   An After Visit Summary (AVS) was printed and given to the patient. Patient escorted via PTAR, and discharged home via [place].  Baldo Ash, RN

## 2019-09-19 NOTE — Discharge Instructions (Addendum)
Diabetic Nephropathy  Diabetic nephropathy is kidney disease that is caused by diabetes (diabetes mellitus). Kidneys are organs that filter and clean blood and get rid of body waste products and extra fluid. Diabetes can cause gradual kidney damage over many years. Diabetic nephropathy that continues to get worse can lead to kidney failure. What are the causes? This condition is caused by kidney damage from diabetes that is not well controlled with treatment. Having high blood sugar (glucose) for a long time because of diabetes can damage blood vessels in the kidneys and cause them to thicken and become scarred. Those changes prevent the kidneys from functioning normally. What increases the risk? This condition is more likely to develop in people with diabetes who:  Have had diabetes for many years.  Have high blood pressure.  Have high blood glucose levels over a long period of time.  Have a family history of kidney disease.  Have a history of tobacco use.  Have certain genes that are passed from parent to child (inherited). What are the signs or symptoms? This condition may not cause symptoms at first. If you do have symptoms, they may include:  Swelling of your hands, feet, or ankles.  Weakness.  Poor appetite.  Nausea.  Confusion.  Tiredness (fatigue).  Trouble sleeping.  Dry, itchy skin. If nephropathy leads to kidney failure, symptoms may include:  Vomiting.  Shortness of breath.  Jerky movements that you cannot control (seizure).  Coma. How is this diagnosed? It is important to diagnose this condition before symptoms develop. You may be screened for diabetic nephropathy at a routine health care visit. Screening tests may include:  Urine tests. These may be done every year.  Urine collection over a 24-hour period to measure kidney function.  Blood tests to measure blood glucose levels and kidney function.  Regular blood pressure monitoring. If your health  care provider suspects diabetic nephropathy, he or she may:  Review your medical history and symptoms.  Do a physical exam.  Do an ultrasound of your kidneys.  Perform a procedure to take a sample of kidney tissue for testing (biopsy). How is this treated? The goal of treatment is to prevent or slow down any damage to your kidneys by managing your diabetes. To do this, it is important to control:  Your blood pressure. ? Your target blood pressure may vary depending on your medical conditions, your age, and other factors. ? To help control blood pressure, you may be prescribed medicines to lower your blood pressure (ACE inhibitors) or to help your body get rid of excess fluid (diuretics).  Your A1c (hemoglobin A1c) level. Generally, the goal of treatment is to maintain an A1c level of less than 7%.  Your blood glucose level.  Your blood lipids. If you have high cholesterol, you may need to take lipid-lowering drugs, such as statins. Other treatments may include:  Medicines, including insulin injections.  Lifestyle changes, such as losing weight, quitting smoking, or making changes to your diet. If your disease progresses to end-stage kidney failure, treatment may include:  Dialysis. This is a procedure to filter your blood with a machine.  Kidney transplant. Follow these instructions at home: Eating and drinking  Eat healthy foods, and eat healthy snacks between meals. Follow instructions from your health care provider about eating and drinking restrictions.  Limit your sodium (salt), protein, or fluid intake as directed.  If you drink alcohol: ? Limit how much you use to:  0-1 drink a day for nonpregnant   women.  0-2 drinks a day for men. ? Be aware of how much alcohol is in your drink. In the [place] one drink equals one 12 oz bottle of beer (355 mL), one 5 oz glass of wine (148 mL), or one 1 oz glass of hard liquor (44 mL). Lifestyle  Maintain a healthy weight. Work  with your health care provider to lose weight, if needed.  Do not use any products that contain nicotine or tobacco, such as cigarettes, e-cigarettes, and chewing tobacco. If you need help quitting, ask your health care provider.  Be physically active every day. Ask your health care provider what type of exercise is best for you.  Work with your health care provider to manage your blood pressure. General instructions      Follow your diabetes management plan as directed. ? Check your blood glucose levels as directed by your health care provider. ? Keep your blood glucose in your target range as directed by your health care provider. ? Have your A1c level checked two or more times a year, or as often as told by your health care provider.  Measure your blood pressure regularly at home, as told by your health care provider.  Take over-the-counter and prescription medicines only as told by your health care provider. These include insulin and supplements.  Keep all follow-up visits and routine visits as told by your health care provider. This is important. Make sure you get screening tests as directed. Where to find more information American Diabetes Association: www.diabetes.org Contact a health care provider if:  You have trouble keeping your blood glucose in your goal range.  Your blood glucose level is higher than 240 mg/dL (13.3 mmol/L) for 2 days in a row.  You have swelling in your hands, ankles, or feet.  You feel weak, tired, or dizzy.  You have sudden muscle tightening (spasms).  You have nausea or vomiting.  You feel tired all the time. Get help right away if:  You are very sleepy.  You faint.  You have: ? A seizure. ? Severe, painful muscle spasms. ? Shortness of breath. ? Chest pain. Summary  Diabetic nephropathy is kidney disease that is caused by diabetes (diabetes mellitus).  Keep your blood sugar (glucose) in your target range as directed by your  health care provider.  Work with your health care provider to manage your blood pressure.  Keep all follow-up visits and routine visits as told by your health care provider. This is important. Make sure you get screening tests as directed. This information is not intended to replace advice given to you by your health care provider. Make sure you discuss any questions you have with your health care provider. Document Revised: 02/14/2019 Document Reviewed: 02/14/2019 Elsevier Patient Education  2020 Elsevier Inc.  

## 2019-09-19 NOTE — Plan of Care (Signed)
  Problem: Education: Goal: Knowledge of General Education information will improve Description: Including pain rating scale, medication(s)/side effects and non-pharmacologic comfort measures Outcome: Adequate for Discharge   Problem: Health Behavior/Discharge Planning: Goal: Ability to manage health-related needs will improve Outcome: Adequate for Discharge   Problem: Clinical Measurements: Goal: Ability to maintain clinical measurements within normal limits will improve Outcome: Adequate for Discharge Goal: Will remain free from infection Outcome: Adequate for Discharge Goal: Diagnostic test results will improve Outcome: Adequate for Discharge Goal: Respiratory complications will improve Outcome: Adequate for Discharge Goal: Cardiovascular complication will be avoided Outcome: Adequate for Discharge   Problem: Activity: Goal: Risk for activity intolerance will decrease 09/19/2019 1709 by Baldo Ash, RN Outcome: Adequate for Discharge 09/19/2019 0806 by Baldo Ash, RN Outcome: Progressing   Problem: Nutrition: Goal: Adequate nutrition will be maintained Outcome: Adequate for Discharge   Problem: Coping: Goal: Level of anxiety will decrease Outcome: Adequate for Discharge   Problem: Elimination: Goal: Will not experience complications related to bowel motility Outcome: Adequate for Discharge Goal: Will not experience complications related to urinary retention Outcome: Adequate for Discharge   Problem: Pain Managment: Goal: General experience of comfort will improve Outcome: Adequate for Discharge   Problem: Safety: Goal: Ability to remain free from injury will improve Outcome: Adequate for Discharge   Problem: Skin Integrity: Goal: Risk for impaired skin integrity will decrease Outcome: Adequate for Discharge   Problem: Acute Rehab PT Goals(only PT should resolve) Goal: Pt Will Go Supine/Side To Sit Outcome: Adequate for Discharge Goal: Pt Will  Transfer Bed To Chair/Chair To Bed Outcome: Adequate for Discharge Goal: Pt Will Ambulate Outcome: Adequate for Discharge Goal: Pt/caregiver will Perform Home Exercise Program Outcome: Adequate for Discharge

## 2019-09-19 NOTE — TOC Transition Note (Signed)
Transition of Care [facility]) - CM/SW Discharge Note   Patient Details  Name: [name] MRN: [id number] Date of Birth: [birth year]  [facility] CM/SW Contact:  Alexander Mt, [place] Phone Number: 09/19/2019, 11:18 AM   Clinical Narrative:    CSW spoke with [facility] to confirm discharge happening today. Pt DME was ordered to be delivered to room. PCP appointment made for 1/27 at 1:30pm. Added to dc instructions.    Final next level of care: [place] Barriers to Discharge: Barriers Resolved   Patient Goals and CMS Choice Patient states their goals for this hospitalization and ongoing recovery are:: to go home, have some extra help CMS Medicare.gov Compare Post Acute Care list provided to:: Patient Choice offered to / list presented to : Patient  Discharge Placement  Patient to be transferred to facility by: private vehicle Name of family member notified: pt to notify daughterT Patient and family notified of of transfer: 09/19/19  Discharge Plan and Services In-house Referral: Clinical Social Work Discharge Planning Services: CM Consult Post Acute Care Choice: Home Health, Durable Medical Equipment   HH Arranged: PT [place] Agency: [facility] (formerly [employer]) Date [place]: 09/13/19 Time [place]: 1718 Representative spoke with at [place]: [place]   Readmission Risk Interventions Readmission Risk Prevention Plan 09/13/2019 09/13/2019  Transportation Screening Complete Complete  PCP or Specialist Appt within 5-7 Days Complete Complete  Home Care Screening Complete Complete  Medication Review (RN CM) Referral to Pharmacy Complete  Some recent data might be hidden

## 2019-09-19 NOTE — Plan of Care (Signed)
  Problem: Activity: Goal: Risk for activity intolerance will decrease Outcome: Progressing   

## 2019-09-19 NOTE — Progress Notes (Signed)
Nutrition Note  RD consulted to provide diet education. Working remotely today. Multiple failed attempts to reach patient by telephone. Handouts MD requested have been added to discharge paperwork for patient to take home and review.  Colman Cater MS,RD,CSG,LDN Office: [phone] Pager: [phone]

## 2019-09-19 NOTE — Discharge Summary (Signed)
Physician Discharge Summary  [name] GBE:[id number] DOB: 1966/03/24 DOA: 09/10/2019  PCP: Vonna Drafts, FNP  Admit date: 09/10/2019 Discharge date: 09/19/2019  Admitted From: home Discharge disposition: home with home health   Recommendations for Outpatient Follow-Up:   1. Resume home health 2. BMP this week re K and Cr 3. Close cardiology follow up 4. Diet adherence encouraged 5. Will need titration of DM medicaitons   Discharge Diagnosis:   Principal Problem:   Acute on chronic systolic CHF (congestive heart failure) (HCC) Active Problems:   Essential hypertension   Diabetes mellitus (HCC)   Chronic renal insufficiency, stage 2 (mild)   AKI (acute kidney injury) ([place])   Chronic pain syndrome   Heart failure ([place])    Discharge Condition: Improved.  Diet recommendation: Low sodium, heart healthy.  Carbohydrate-modified. (low K)  Wound care: None.  Code status: Full.   History of Present Illness:   [name] is a 54 y.o. female with medical history significant of HFrEF with prior EF 31%, DM2, HTN, chronic pain syndrome.  Patient presents to the ED with gradual onset, persistent, and worsening SOB.  Symptoms onset over about the past week.  She states that she has worsening shortness of breath with exertion.  Taking lasix as directed but not working.  She reports significant swelling to her extremities into her abdomen. She denies being in any pain. She denies any fever, chills, or productive cough.  O2 sat in the 80s with exertion.   Hospital Course by Problem:   Acute on chronic systolic CHF (congestive heart failure)  -  IV diuretics with close monitoring of her daily weights and I/Os as well as daily BMPs-- had swift diuresis in the first part of her hospitalization leading to AKI - she admits to not taking her Lasix regularly prior to running out 1 month ago  - d/c entresto due to hyperkalemia- outpatient follow up with  Dr. Liliane Shi -   ECHO shows an EF of 35-40% with grade 1 dCHF -10L of diuresis - discussed with Dr. Mamie Nick regarding her slowly increased Cr and elevated K: held lasix x 24 hours with minimal output, started bidil, stop entresto until follow up with him -was on lasix 40 daily, have increased to BID -close follow up with cardiology -fluid restriction also discussed with patient  Hyperkalemia - started Lokelma  - hold entresto -changed diet to renal (was drinking OJ and eating potatoes-- threw her stash in the trash)    Essential hypertension - cont Toprol    Diabetes mellitus - due to renal function-- d/c'd metformin -added tradjenta as excreted through liver -will probably need another agent     Chronic renal insufficiency, stage 3 -close outpatient follow up     Chronic pain syndrome - cont Butrans patch, Neurontin, Percocet- Oxycontin substituted for Xtampza  Nicotine abuse - cont Nicoderm patch  Morbid obesity Estimated body mass index is 79.22 kg/m as calculated from the following:   Height as of this encounter: 5' 3"  (1.6 m).   Weight as of this encounter: 202.8 kg.  Gout -cont Allopurinol     Medical Consultants:      Discharge Exam:   Vitals:   09/18/19 2121 09/19/19 0410  BP: 128/66 138/72  Pulse: 80 81  Resp: 20 20  Temp: 98.1 F (36.7 C) 98.9 F (37.2 C)  SpO2: 98% 98%   Vitals:   09/18/19 1537 09/18/19 1600 09/18/19 2121 09/19/19 0410  BP: Marland Kitchen)  94/53  128/66 138/72  Pulse: 79  80 81  Resp: 18  20 20   Temp: 98.4 F (36.9 C)  98.1 F (36.7 C) 98.9 F (37.2 C)  TempSrc: Oral  Oral Oral  SpO2: 93%  98% 98%  Weight:  (!) 203.1 kg  (!) 202.8 kg  Height:        General exam: Appears calm and comfortable.    The results of significant diagnostics from this hospitalization (including imaging, microbiology, ancillary and laboratory) are listed below for reference.     Procedures and Diagnostic Studies:   DG Chest 2 View  Result  Date: 09/11/2019 CLINICAL DATA:  Shortness of breath, low oxygen saturations, increased lower extremity swelling EXAM: CHEST - 2 VIEW COMPARISON:  Radiograph 03/01/2019 FINDINGS: Lung volumes are low. Overall increased attenuation likely secondary to body habitus. There are hazy interstitial opacities within the lungs with some cephalized, indistinctness of the pulmonary vascularity. Cardiomegaly is present, similar to prior. No pneumothorax. No visible effusion. No acute osseous or soft tissue abnormality. Degenerative changes are present in the imaged spine and shoulders. Cervical fusion hardware is visualized but incompletely evaluated on non dedicated radiograph. IMPRESSION: 1. Low lung volumes with hazy interstitial opacities within the lungs and cardiomegaly compatible with mild to moderate pulmonary edema. Electronically Signed   By: Lovena Le M.D.   On: 09/11/2019 00:10     Labs:   Basic Metabolic Panel: Recent Labs  Lab 09/15/19 0534 09/16/19 0207 09/17/19 0455 09/18/19 0123 09/19/19 0850  NA 135 134* 135 133* 138  K 5.9* 5.2* 5.9* 5.4* 5.4*  CL 101 98 100 98 100  CO2 29 28 27 28 29   GLUCOSE 167* 228* 178* 239* 184*  BUN 52* 56* 65* 67* 65*  CREATININE 1.93* 2.01* 2.23* 1.99* 1.98*  CALCIUM 8.0* 7.9* 7.8* 7.8* 8.4*   GFR Estimated Creatinine Clearance: 59.8 mL/min (A) (by C-G formula based on SCr of 1.98 mg/dL (H)). Liver Function Tests: No results for input(s): AST, ALT, ALKPHOS, BILITOT, PROT, ALBUMIN in the last 168 hours. No results for input(s): LIPASE, AMYLASE in the last 168 hours. No results for input(s): AMMONIA in the last 168 hours. Coagulation profile No results for input(s): INR, PROTIME in the last 168 hours.  CBC: Recent Labs  Lab 09/15/19 0534 09/16/19 0207 09/17/19 0455 09/18/19 0123  WBC 4.9 5.3 6.2 6.0  HGB 10.3* 10.4* 10.3* 10.7*  HCT 33.6* 34.7* 34.1* 35.9*  MCV 96.6 96.9 97.7 97.8  PLT 187 189 202 193   Cardiac Enzymes: No results for  input(s): CKTOTAL, CKMB, CKMBINDEX, TROPONINI in the last 168 hours. BNP: Invalid input(s): POCBNP CBG: Recent Labs  Lab 09/18/19 1121 09/18/19 1533 09/18/19 2123 09/19/19 0611 09/19/19 1107  GLUCAP 150* 167* 180* 178* 173*   D-Dimer No results for input(s): DDIMER in the last 72 hours. Hgb A1c No results for input(s): HGBA1C in the last 72 hours. Lipid Profile No results for input(s): CHOL, HDL, LDLCALC, TRIG, CHOLHDL, LDLDIRECT in the last 72 hours. Thyroid function studies No results for input(s): TSH, T4TOTAL, T3FREE, THYROIDAB in the last 72 hours.  Invalid input(s): FREET3 Anemia work up No results for input(s): VITAMINB12, FOLATE, FERRITIN, TIBC, IRON, RETICCTPCT in the last 72 hours. Microbiology Recent Results (from the past 240 hour(s))  SARS CORONAVIRUS 2 (TAT 6-24 HRS) Nasopharyngeal Nasopharyngeal Swab     Status: None   Collection Time: 09/11/19  5:57 AM   Specimen: Nasopharyngeal Swab  Result Value Ref Range Status   SARS  Coronavirus 2 NEGATIVE NEGATIVE Final    Comment: (NOTE) SARS-CoV-2 target nucleic acids are NOT DETECTED. The SARS-CoV-2 RNA is generally detectable in upper and lower respiratory specimens during the acute phase of infection. Negative results do not preclude SARS-CoV-2 infection, do not rule out co-infections with other pathogens, and should not be used as the sole basis for treatment or other patient management decisions. Negative results must be combined with clinical observations, patient history, and epidemiological information. The expected result is Negative. Fact Sheet for Patients: SugarRoll.be Fact Sheet for Healthcare Providers: https://www.woods-mathews.com/ This test is not yet approved or cleared by the [place] FDA and  has been authorized for detection and/or diagnosis of SARS-CoV-2 by FDA under an Emergency Use Authorization (EUA). This EUA will remain  in effect (meaning  this test can be used) for the duration of the COVID-19 declaration under Section 56 4(b)(1) of the Act, 21 U.S.C. section 360bbb-3(b)(1), unless the authorization is terminated or revoked sooner. Performed at [facility], [place] [address], [place],  [address]      Discharge Instructions:   Discharge Instructions    (HEART FAILURE PATIENTS) Call MD:  Anytime you have any of the following symptoms: 1) 3 pound weight gain in 24 hours or 5 pounds in 1 week 2) shortness of breath, with or without a dry hacking cough 3) swelling in the hands, feet or stomach 4) if you have to sleep on extra pillows at night in order to breathe.   Complete by: As directed    Diet - low sodium heart healthy   Complete by: As directed    Diet Carb Modified   Complete by: As directed    Discharge instructions   Complete by: As directed    Avoid foods high in potassium Fluid restrict to 1.68m/day (15025mor cc)   Heart Failure patients record your daily weight using the same scale at the same time of day   Complete by: As directed    Increase activity slowly   Complete by: As directed      Allergies as of 09/19/2019   No Known Allergies     Medication List    STOP taking these medications   Entresto 49-51 MG Generic drug: sacubitril-valsartan   metFORMIN 850 MG tablet Commonly known as: GLUCOPHAGE     TAKE these medications   albuterol (2.5 MG/3ML) 0.083% nebulizer solution Commonly known as: PROVENTIL Take 2.5 mg by nebulization every 4 (four) hours as needed for wheezing.   allopurinol 300 MG tablet Commonly known as: ZYLOPRIM Take 300 mg by mouth daily.   ALPRAZolam 0.25 MG tablet Commonly known as: XANAX Take 0.25 mg by mouth 2 (two) times daily as needed for anxiety.   buprenorphine 20 MCG/HR Ptwk patch Commonly known as: BUTRANS Place 1 patch onto the skin once a week.   furosemide 40 MG tablet Commonly known as: Lasix Take 1 tablet (40 mg total) by mouth 2 (two)  times daily. What changed: when to take this   gabapentin 100 MG capsule Commonly known as: NEURONTIN Take 100 mg by mouth 3 (three) times daily.   isosorbide-hydrALAZINE 20-37.5 MG tablet Commonly known as: BIDIL Take 1 tablet by mouth 3 (three) times daily.   linagliptin 5 MG Tabs tablet Commonly known as: TRADJENTA Take 1 tablet (5 mg total) by mouth daily. Start taking on: September 20, 2019   metoprolol succinate 25 MG 24 hr tablet Commonly known as: TOPROL-XL Take 37.5 mg by mouth daily.  Narcan 4 MG/0.1ML Liqd nasal spray kit Generic drug: naloxone Place 0.4 mg into the nose as needed (for overdose).   nicotine 21 mg/24hr patch Commonly known as: NICODERM CQ - dosed in mg/24 hours Place 1 patch (21 mg total) onto the skin daily. Start taking on: September 20, 2019   oxyCODONE-acetaminophen 10-325 MG tablet Commonly known as: PERCOCET Take 1 tablet by mouth every 8 (eight) hours as needed for pain. What changed: when to take this   sodium zirconium cyclosilicate 10 g Pack packet Commonly known as: LOKELMA Take 10 g by mouth 2 (two) times daily.   Vitamin D (Ergocalciferol) 1.25 MG (50000 UT) Caps capsule Commonly known as: DRISDOL Take 50,000 Units by mouth every Sunday.   Xtampza ER 36 MG C12a Generic drug: oxyCODONE ER Take 36 mg by mouth 2 (two) times daily.      Follow-up Information    Nigel Mormon, MD Follow up on 09/20/2019.   Specialties: Cardiology, Radiology Why: 2:30 PM Contact information: [place] [phone]        Vonna Drafts, [place]. Go on 10/12/2019.   Specialty: Nurse Practitioner Why: Appointment is for 1:30pm, please wear a mask. Call if needing to cancel or reschedule.  Contact information: [place] [address] [phone]        [facility] Follow up.   Specialty: Home Health Services Why: [facility] is scheduled to provide HHPT. Please  f/u with them if any questions.  Contact information: [address] [place] [address] [phone]            Time coordinating discharge: 35 min  Signed:  Geradine Girt DO  [facility] 09/19/2019, 11:28 AM

## 2019-09-20 ENCOUNTER — Ambulatory Visit: Payer: Medicare Other | Admitting: Cardiology

## 2019-09-20 DIAGNOSIS — M19042 Primary osteoarthritis, left hand: Secondary | ICD-10-CM | POA: Diagnosis not present

## 2019-09-20 DIAGNOSIS — E7800 Pure hypercholesterolemia, unspecified: Secondary | ICD-10-CM | POA: Diagnosis not present

## 2019-09-20 DIAGNOSIS — Z981 Arthrodesis status: Secondary | ICD-10-CM | POA: Diagnosis not present

## 2019-09-20 DIAGNOSIS — I5023 Acute on chronic systolic (congestive) heart failure: Secondary | ICD-10-CM | POA: Diagnosis not present

## 2019-09-20 DIAGNOSIS — Z9889 Other specified postprocedural states: Secondary | ICD-10-CM | POA: Diagnosis not present

## 2019-09-20 DIAGNOSIS — N183 Chronic kidney disease, stage 3 unspecified: Secondary | ICD-10-CM | POA: Diagnosis not present

## 2019-09-20 DIAGNOSIS — G4730 Sleep apnea, unspecified: Secondary | ICD-10-CM | POA: Diagnosis not present

## 2019-09-20 DIAGNOSIS — E1140 Type 2 diabetes mellitus with diabetic neuropathy, unspecified: Secondary | ICD-10-CM | POA: Diagnosis not present

## 2019-09-20 DIAGNOSIS — K219 Gastro-esophageal reflux disease without esophagitis: Secondary | ICD-10-CM | POA: Diagnosis not present

## 2019-09-20 DIAGNOSIS — M19041 Primary osteoarthritis, right hand: Secondary | ICD-10-CM | POA: Diagnosis not present

## 2019-09-20 DIAGNOSIS — M109 Gout, unspecified: Secondary | ICD-10-CM | POA: Diagnosis not present

## 2019-09-20 DIAGNOSIS — M47819 Spondylosis without myelopathy or radiculopathy, site unspecified: Secondary | ICD-10-CM | POA: Diagnosis not present

## 2019-09-20 DIAGNOSIS — I429 Cardiomyopathy, unspecified: Secondary | ICD-10-CM | POA: Diagnosis not present

## 2019-09-20 DIAGNOSIS — G894 Chronic pain syndrome: Secondary | ICD-10-CM | POA: Diagnosis not present

## 2019-09-20 DIAGNOSIS — I130 Hypertensive heart and chronic kidney disease with heart failure and stage 1 through stage 4 chronic kidney disease, or unspecified chronic kidney disease: Secondary | ICD-10-CM | POA: Diagnosis not present

## 2019-09-20 DIAGNOSIS — Z96653 Presence of artificial knee joint, bilateral: Secondary | ICD-10-CM | POA: Diagnosis not present

## 2019-09-20 DIAGNOSIS — M19072 Primary osteoarthritis, left ankle and foot: Secondary | ICD-10-CM | POA: Diagnosis not present

## 2019-09-20 DIAGNOSIS — E1122 Type 2 diabetes mellitus with diabetic chronic kidney disease: Secondary | ICD-10-CM | POA: Diagnosis not present

## 2019-09-20 DIAGNOSIS — Z9181 History of falling: Secondary | ICD-10-CM | POA: Diagnosis not present

## 2019-09-20 DIAGNOSIS — M19071 Primary osteoarthritis, right ankle and foot: Secondary | ICD-10-CM | POA: Diagnosis not present

## 2019-09-21 DIAGNOSIS — G8929 Other chronic pain: Secondary | ICD-10-CM | POA: Diagnosis not present

## 2019-09-21 DIAGNOSIS — G629 Polyneuropathy, unspecified: Secondary | ICD-10-CM | POA: Diagnosis not present

## 2019-09-21 DIAGNOSIS — M109 Gout, unspecified: Secondary | ICD-10-CM | POA: Diagnosis not present

## 2019-09-21 DIAGNOSIS — Z1159 Encounter for screening for other viral diseases: Secondary | ICD-10-CM | POA: Diagnosis not present

## 2019-09-21 DIAGNOSIS — Z79899 Other long term (current) drug therapy: Secondary | ICD-10-CM | POA: Diagnosis not present

## 2019-09-21 DIAGNOSIS — M545 Low back pain: Secondary | ICD-10-CM | POA: Diagnosis not present

## 2019-09-21 DIAGNOSIS — F17210 Nicotine dependence, cigarettes, uncomplicated: Secondary | ICD-10-CM | POA: Diagnosis not present

## 2019-09-22 ENCOUNTER — Ambulatory Visit: Payer: Medicare Other | Admitting: Cardiology

## 2019-09-22 DIAGNOSIS — J42 Unspecified chronic bronchitis: Secondary | ICD-10-CM | POA: Diagnosis not present

## 2019-09-23 DIAGNOSIS — I10 Essential (primary) hypertension: Secondary | ICD-10-CM | POA: Diagnosis not present

## 2019-09-23 DIAGNOSIS — I509 Heart failure, unspecified: Secondary | ICD-10-CM | POA: Diagnosis not present

## 2019-09-23 DIAGNOSIS — E1129 Type 2 diabetes mellitus with other diabetic kidney complication: Secondary | ICD-10-CM | POA: Diagnosis not present

## 2019-09-23 DIAGNOSIS — F17200 Nicotine dependence, unspecified, uncomplicated: Secondary | ICD-10-CM | POA: Diagnosis not present

## 2019-09-26 ENCOUNTER — Encounter: Payer: Self-pay | Admitting: Cardiology

## 2019-09-26 ENCOUNTER — Other Ambulatory Visit: Payer: Self-pay | Admitting: Cardiology

## 2019-09-26 ENCOUNTER — Ambulatory Visit (INDEPENDENT_AMBULATORY_CARE_PROVIDER_SITE_OTHER): Payer: Medicare Other | Admitting: Cardiology

## 2019-09-26 ENCOUNTER — Other Ambulatory Visit: Payer: Self-pay

## 2019-09-26 VITALS — BP 146/84 | HR 101 | Temp 98.7°F | Ht 63.0 in | Wt >= 6400 oz

## 2019-09-26 DIAGNOSIS — I5022 Chronic systolic (congestive) heart failure: Secondary | ICD-10-CM | POA: Diagnosis not present

## 2019-09-26 DIAGNOSIS — I129 Hypertensive chronic kidney disease with stage 1 through stage 4 chronic kidney disease, or unspecified chronic kidney disease: Secondary | ICD-10-CM

## 2019-09-26 DIAGNOSIS — N1832 Chronic kidney disease, stage 3b: Secondary | ICD-10-CM | POA: Diagnosis not present

## 2019-09-26 DIAGNOSIS — I10 Essential (primary) hypertension: Secondary | ICD-10-CM

## 2019-09-26 MED ORDER — BIDIL 20-37.5 MG PO TABS: 2.0000 | ORAL_TABLET | Freq: Three times a day (TID) | ORAL | 2 refills | Status: DC

## 2019-09-26 MED ORDER — METOPROLOL SUCCINATE ER 25 MG PO TB24: 25.0000 mg | ORAL_TABLET | Freq: Every day | ORAL | 1 refills | Status: DC

## 2019-09-26 NOTE — Progress Notes (Signed)
Follow up visit  Subjective:   [name], female    DOB: 05/25/1966, 54 y.o.   MRN: [id number]   Chief complaint: Cardiomyopathy   Congestive Heart Failure Pertinent negatives include no abdominal pain, chest pain or palpitations.    54 year old African-American female with hypertension, type II diabetes mellitus, tobacco abuse, morbid obesity BMI 67, heart failure with reduced ejection fraction (likely nonischemic cardiomyopathy), negative nuclear stress test 01/2018.  Recently admitted with heart failure exacerbation on 09/10/19. She was aggressively diuresed; however, developed AKI and hyperkalemia. [name] is now on hold. She was started on Bidil. She now presents for hospital follow up.  She is feeling much better since discharge. She is trying to make changes to her diet. Tolerating medications well. She is found to be not taking her Metoprolol.    Past Medical History:  Diagnosis Date  . Arthritis    "hands, arms, feet, back, knees" (02/17/2018)  . Cardiomyopathy [facility])    From Dr. Bonney Roussel office notes from 01/13/18  . CHF (congestive heart failure) ([place])   . Chronic lower back pain   . Diabetic neuropathy ([place])   . Family history of adverse reaction to anesthesia    Mother is hard to arouse and blood pressure drops  . GERD (gastroesophageal reflux disease)   . High cholesterol   . Hypertension   . Sleep apnea    "need new machine"  - does not use a cpap (02/17/2018)  . Type II diabetes mellitus ([place])    no longer on medications (02/17/2018)     Past Surgical History:  Procedure Laterality Date  . ANTERIOR CERVICAL DECOMP/DISCECTOMY FUSION  03/06/2010   C4-7 w/corpectomy/notes 03/17/2010  . BACK SURGERY    . CARPAL TUNNEL RELEASE Right   . HARDWARE REVISION  04/10/2010   cervical hardware revision screw replacement C4/notes 04/12/2010  . JOINT REPLACEMENT    . LAPAROSCOPIC CHOLECYSTECTOMY  1999  . LUMBAR LAMINECTOMY/DECOMPRESSION MICRODISCECTOMY  09/2010     Archie Endo 10/05/2010  . MULTIPLE EXTRACTIONS WITH ALVEOLOPLASTY [name] 01/19/2015   Procedure: MULTIPLE EXTRACTIONS Three, Four, Five, Seven, eight, nine, ten, eleven, twelve, fourteen, eighteen, twenty-one, twenty-eight, twenty-nine, thirty-one with Alveoloplasty.  ;  Surgeon: Diona Browner, DDS;  Location: [facility];  Service: Oral Surgery;  Laterality: [name];  . REPLACEMENT TOTAL KNEE BILATERAL Bilateral 10/2007-03/2008   "left-right"  . TOENAIL EXCISION Bilateral    great toes  . TONSILLECTOMY    . TOTAL KNEE REVISION Right 02/17/2018  . TOTAL KNEE REVISION Right 02/17/2018   Procedure: RIGHT TOTAL KNEE REVISION;  Surgeon: Newt Minion, MD;  Location: [facility];  Service: Orthopedics;  Laterality: Right;  . TOTAL KNEE REVISION Right 05/14/2018   Procedure: RIGHT TOTAL KNEE REVISION TO HINGED KNEE;  Surgeon: Newt Minion, MD;  Location: [facility];  Service: Orthopedics;  Laterality: Right;     Social History   Socioeconomic History  . Marital status: Widowed    Spouse name: Not on file  . Number of children: 2  . Years of education: Not on file  . Highest education level: Not on file  Occupational History  . Not on file  Tobacco Use  . Smoking status: Current Every Day Smoker    Packs/day: 0.50    Years: 32.00    Pack years: 16.00    Types: Cigarettes  . Smokeless tobacco: Never Used  Substance and Sexual Activity  . Alcohol use: Not Currently  . Drug use: Not Currently    Types: Marijuana  .  Sexual activity: Not Currently  Other Topics Concern  . Not on file  Social History Narrative  . Not on file   Social Determinants of Health   Financial Resource Strain:   . Difficulty of Paying Living Expenses: Not on file  Food Insecurity: Food Insecurity Present  . Worried About Charity fundraiser in the Last Year: Sometimes true  . Ran Out of Food in the Last Year: Not on file  Transportation Needs: No Transportation Needs  . Lack of Transportation (Medical): No  . Lack of Transportation  (Non-Medical): No  Physical Activity:   . Days of Exercise per Week: Not on file  . Minutes of Exercise per Session: Not on file  Stress:   . Feeling of Stress : Not on file  Social Connections:   . Frequency of Communication with Friends and Family: Not on file  . Frequency of Social Gatherings with Friends and Family: Not on file  . Attends Religious Services: Not on file  . Active Member of Clubs or Organizations: Not on file  . Attends Archivist Meetings: Not on file  . Marital Status: Not on file  Intimate Partner Violence:   . Fear of Current or Ex-Partner: Not on file  . Emotionally Abused: Not on file  . Physically Abused: Not on file  . Sexually Abused: Not on file     Family History  Problem Relation Age of Onset  . Kidney disease Mother   . Congestive Heart Failure Mother   . Hypertension Father      Current Outpatient Medications on File Prior to Visit  Medication Sig Dispense Refill  . albuterol (PROVENTIL) (2.5 MG/3ML) 0.083% nebulizer solution Take 2.5 mg by nebulization every 4 (four) hours as needed for wheezing.     Marland Kitchen allopurinol (ZYLOPRIM) 300 MG tablet Take 300 mg by mouth daily.     Marland Kitchen ALPRAZolam (XANAX) 0.25 MG tablet Take 0.25 mg by mouth 2 (two) times daily as needed for anxiety.     . buprenorphine (BUTRANS) 20 MCG/HR PTWK patch Place 1 patch onto the skin once a week.    . furosemide (LASIX) 40 MG tablet Take 1 tablet (40 mg total) by mouth 2 (two) times daily. 60 tablet 0  . gabapentin (NEURONTIN) 100 MG capsule Take 100 mg by mouth 3 (three) times daily.    . isosorbide-hydrALAZINE (BIDIL) 20-37.5 MG tablet Take 1 tablet by mouth 3 (three) times daily. 90 tablet 0  . linagliptin (TRADJENTA) 5 MG TABS tablet Take 1 tablet (5 mg total) by mouth daily. 30 tablet 0  . NARCAN 4 MG/0.1ML LIQD nasal spray kit Place 0.4 mg into the nose as needed (for overdose).     Marland Kitchen oxyCODONE-acetaminophen (PERCOCET) 10-325 MG tablet Take 1 tablet by mouth every  8 (eight) hours as needed for pain. (Patient taking differently: Take 1 tablet by mouth 4 (four) times daily as needed for pain. ) 28 tablet 0  . sodium zirconium cyclosilicate (LOKELMA) 10 g PACK packet Take 10 g by mouth daily. 15 packet 0  . Vitamin D, Ergocalciferol, (DRISDOL) 1.25 MG (50000 UT) CAPS capsule Take 50,000 Units by mouth every Sunday.    Marland Kitchen XTAMPZA ER 36 MG C12A Take 36 mg by mouth 2 (two) times daily.      No current facility-administered medications on file prior to visit.    Cardiovascular studies:  Echo 09/12/2019:  1. Left ventricular ejection fraction, by visual estimation, is 35 to 40%. The left  ventricle has moderate to severely decreased function. There is mildly increased left ventricular hypertrophy.  2. Definity contrast agent was given IV to delineate the left ventricular endocardial borders.  3. Left ventricular diastolic parameters are consistent with Grade I diastolic dysfunction (impaired relaxation).  4. The left ventricle demonstrates global hypokinesis.  5. Global right ventricle was not well visualized.The right ventricular size is not well visualized. Right vetricular wall thickness was not assessed.  6. Left atrial size was not well visualized.  7. Right atrial size was not well visualized.  8. The mitral valve is grossly normal. Trivial mitral valve regurgitation.  9. The tricuspid valve is not well visualized. 10. The aortic valve was not well visualized. Aortic valve regurgitation is not visualized. 11. The pulmonic valve was not well visualized. Pulmonic valve regurgitation is not visualized. 12. The aortic root was not well visualized. 13. Moderately elevated pulmonary artery systolic pressure. 14. The inferior vena cava is normal in size with <50% respiratory variability, suggesting right atrial pressure of 8 mmHg. 15. The interatrial septum was not well visualized.  Lexiscan nucler stress test 01/08/2018 [facility]):" 1. No reversible ischemia  or infarction.  2. Global hypokinesis with severe left ventricular dilatation.  3. Left ventricular ejection fraction 31%  4. High-risk stress test findings*.  Recent labs: 09/18/18: Glucose 173, potassium 5.4, Creatinine 1.98, eGFR 29/33, BMP otherwise normal.    Review of Systems  Constitution: Negative for decreased appetite, malaise/fatigue, weight gain and weight loss.  HENT: Negative for congestion.   Eyes: Negative for visual disturbance.  Cardiovascular: Positive for dyspnea on exertion (improved) and leg swelling (improved). Negative for chest pain, palpitations and syncope.  Respiratory: Negative for cough.   Endocrine: Negative for cold intolerance.  Hematologic/Lymphatic: Does not bruise/bleed easily.  Skin: Negative for itching and rash.  Musculoskeletal: Positive for joint pain. Negative for myalgias.  Gastrointestinal: Negative for abdominal pain, nausea and vomiting.  Genitourinary: Negative for dysuria.  Neurological: Negative for dizziness and weakness.  Psychiatric/Behavioral: The patient is not nervous/anxious.   All other systems reviewed and are negative.       Today's Vitals   09/26/19 1417  BP: (!) 146/84  Pulse: (!) 101  Temp: 98.7 F (37.1 C)  SpO2: (!) 88%  Weight: (!) 450 lb (204.1 kg)  Height: '5\' 3"'$  (1.6 m)   Body mass index is 79.71 kg/m.   Objective:   Physical Exam  Constitutional: She is oriented to person, place, and time. She appears well-developed and well-nourished. No distress.  Morbid obesity  HENT:  Head: Normocephalic and atraumatic.  Eyes: Pupils are equal, round, and reactive to light. Conjunctivae are normal.  Neck: No JVD present.  Cardiovascular: Normal rate and regular rhythm. Exam reveals decreased pulses.  Pulmonary/Chest: Effort normal and breath sounds normal. She has no wheezes. She has no rales.  Abdominal: Soft. Bowel sounds are normal. There is no rebound.  Musculoskeletal:        General: Edema (2+ edema)  present.  Lymphadenopathy:    She has no cervical adenopathy.  Neurological: She is alert and oriented to person, place, and time. No cranial nerve deficit.  Skin: Skin is warm and dry.  Psychiatric: She has a normal mood and affect.  Nursing note and vitals reviewed.      Assessment & Recommendations:   54 year old African-American female with hypertension, type II diabetes mellitus, tobacco abuse, morbid obesity BMI 67, heart failure with reduced ejection fraction (likely nonischemic cardiomyopathy), negative nuclear stress test 01/2018. Here  for hospital follow up.  HFrEF: Feeling much better. Does not appear to be volume overloaded; however, she has gained a few pounds since discharge.  NYHA class II-III symptoms. Continue to hold Entresto for now in view of CKD. Will restart metoprolol succinate 25 mg daily.  Will increase Bidil to 2 tablets TID Continue lasix 40 mg daily Discussed dietary changes to avoid low sodium foods. She has multiple questions regarding her diet and heart failure. I do feel that she would benefit from Heart Failure home health program, will see if we can arrange this. Discussed daily weights and to call for 3 lb weight gain in 1 day.  Check BNP.  HTN:  Elevated today, hopefully will improve with resuming Metoprolol and increasing Bidil.  AKI on CKD: Will repeat BMP today to follow up on her kidney function and potassium levels. If able will restart Entresto.   Morbid obesity: Strongly suggested that she continue to work on this with needed dietary changes.   Will follow up in 3 weeks for close monitoring. Patient reports that she will be compliant with medications and her upcoming appointments.     Miquel Dunn, MSN, APRN, FNP-C [facility]. [place] Office: [phone] Fax: [phone]

## 2019-09-27 DIAGNOSIS — E1122 Type 2 diabetes mellitus with diabetic chronic kidney disease: Secondary | ICD-10-CM | POA: Diagnosis not present

## 2019-09-27 DIAGNOSIS — I429 Cardiomyopathy, unspecified: Secondary | ICD-10-CM | POA: Diagnosis not present

## 2019-09-27 DIAGNOSIS — K219 Gastro-esophageal reflux disease without esophagitis: Secondary | ICD-10-CM | POA: Diagnosis not present

## 2019-09-27 DIAGNOSIS — E7800 Pure hypercholesterolemia, unspecified: Secondary | ICD-10-CM | POA: Diagnosis not present

## 2019-09-27 DIAGNOSIS — Z9181 History of falling: Secondary | ICD-10-CM | POA: Diagnosis not present

## 2019-09-27 DIAGNOSIS — Z9889 Other specified postprocedural states: Secondary | ICD-10-CM | POA: Diagnosis not present

## 2019-09-27 DIAGNOSIS — I5023 Acute on chronic systolic (congestive) heart failure: Secondary | ICD-10-CM | POA: Diagnosis not present

## 2019-09-27 DIAGNOSIS — Z981 Arthrodesis status: Secondary | ICD-10-CM | POA: Diagnosis not present

## 2019-09-27 DIAGNOSIS — G4730 Sleep apnea, unspecified: Secondary | ICD-10-CM | POA: Diagnosis not present

## 2019-09-27 DIAGNOSIS — M19042 Primary osteoarthritis, left hand: Secondary | ICD-10-CM | POA: Diagnosis not present

## 2019-09-27 DIAGNOSIS — I130 Hypertensive heart and chronic kidney disease with heart failure and stage 1 through stage 4 chronic kidney disease, or unspecified chronic kidney disease: Secondary | ICD-10-CM | POA: Diagnosis not present

## 2019-09-27 DIAGNOSIS — E1140 Type 2 diabetes mellitus with diabetic neuropathy, unspecified: Secondary | ICD-10-CM | POA: Diagnosis not present

## 2019-09-27 DIAGNOSIS — Z96653 Presence of artificial knee joint, bilateral: Secondary | ICD-10-CM | POA: Diagnosis not present

## 2019-09-27 DIAGNOSIS — M47819 Spondylosis without myelopathy or radiculopathy, site unspecified: Secondary | ICD-10-CM | POA: Diagnosis not present

## 2019-09-27 DIAGNOSIS — M19041 Primary osteoarthritis, right hand: Secondary | ICD-10-CM | POA: Diagnosis not present

## 2019-09-27 DIAGNOSIS — M19072 Primary osteoarthritis, left ankle and foot: Secondary | ICD-10-CM | POA: Diagnosis not present

## 2019-09-27 DIAGNOSIS — M109 Gout, unspecified: Secondary | ICD-10-CM | POA: Diagnosis not present

## 2019-09-27 DIAGNOSIS — M19071 Primary osteoarthritis, right ankle and foot: Secondary | ICD-10-CM | POA: Diagnosis not present

## 2019-09-27 DIAGNOSIS — G894 Chronic pain syndrome: Secondary | ICD-10-CM | POA: Diagnosis not present

## 2019-09-27 DIAGNOSIS — N183 Chronic kidney disease, stage 3 unspecified: Secondary | ICD-10-CM | POA: Diagnosis not present

## 2019-09-29 DIAGNOSIS — K219 Gastro-esophageal reflux disease without esophagitis: Secondary | ICD-10-CM | POA: Diagnosis not present

## 2019-09-29 DIAGNOSIS — Z9181 History of falling: Secondary | ICD-10-CM | POA: Diagnosis not present

## 2019-09-29 DIAGNOSIS — M19041 Primary osteoarthritis, right hand: Secondary | ICD-10-CM | POA: Diagnosis not present

## 2019-09-29 DIAGNOSIS — M19072 Primary osteoarthritis, left ankle and foot: Secondary | ICD-10-CM | POA: Diagnosis not present

## 2019-09-29 DIAGNOSIS — Z981 Arthrodesis status: Secondary | ICD-10-CM | POA: Diagnosis not present

## 2019-09-29 DIAGNOSIS — G4730 Sleep apnea, unspecified: Secondary | ICD-10-CM | POA: Diagnosis not present

## 2019-09-29 DIAGNOSIS — M19042 Primary osteoarthritis, left hand: Secondary | ICD-10-CM | POA: Diagnosis not present

## 2019-09-29 DIAGNOSIS — N183 Chronic kidney disease, stage 3 unspecified: Secondary | ICD-10-CM | POA: Diagnosis not present

## 2019-09-29 DIAGNOSIS — E1140 Type 2 diabetes mellitus with diabetic neuropathy, unspecified: Secondary | ICD-10-CM | POA: Diagnosis not present

## 2019-09-29 DIAGNOSIS — M47819 Spondylosis without myelopathy or radiculopathy, site unspecified: Secondary | ICD-10-CM | POA: Diagnosis not present

## 2019-09-29 DIAGNOSIS — E1122 Type 2 diabetes mellitus with diabetic chronic kidney disease: Secondary | ICD-10-CM | POA: Diagnosis not present

## 2019-09-29 DIAGNOSIS — Z96653 Presence of artificial knee joint, bilateral: Secondary | ICD-10-CM | POA: Diagnosis not present

## 2019-09-29 DIAGNOSIS — I130 Hypertensive heart and chronic kidney disease with heart failure and stage 1 through stage 4 chronic kidney disease, or unspecified chronic kidney disease: Secondary | ICD-10-CM | POA: Diagnosis not present

## 2019-09-29 DIAGNOSIS — M19071 Primary osteoarthritis, right ankle and foot: Secondary | ICD-10-CM | POA: Diagnosis not present

## 2019-09-29 DIAGNOSIS — I429 Cardiomyopathy, unspecified: Secondary | ICD-10-CM | POA: Diagnosis not present

## 2019-09-29 DIAGNOSIS — I5023 Acute on chronic systolic (congestive) heart failure: Secondary | ICD-10-CM | POA: Diagnosis not present

## 2019-09-29 DIAGNOSIS — G894 Chronic pain syndrome: Secondary | ICD-10-CM | POA: Diagnosis not present

## 2019-09-29 DIAGNOSIS — Z9889 Other specified postprocedural states: Secondary | ICD-10-CM | POA: Diagnosis not present

## 2019-09-29 DIAGNOSIS — E7800 Pure hypercholesterolemia, unspecified: Secondary | ICD-10-CM | POA: Diagnosis not present

## 2019-09-29 DIAGNOSIS — M109 Gout, unspecified: Secondary | ICD-10-CM | POA: Diagnosis not present

## 2019-10-04 DIAGNOSIS — M19072 Primary osteoarthritis, left ankle and foot: Secondary | ICD-10-CM | POA: Diagnosis not present

## 2019-10-04 DIAGNOSIS — K219 Gastro-esophageal reflux disease without esophagitis: Secondary | ICD-10-CM | POA: Diagnosis not present

## 2019-10-04 DIAGNOSIS — E7800 Pure hypercholesterolemia, unspecified: Secondary | ICD-10-CM | POA: Diagnosis not present

## 2019-10-04 DIAGNOSIS — G894 Chronic pain syndrome: Secondary | ICD-10-CM | POA: Diagnosis not present

## 2019-10-04 DIAGNOSIS — G4730 Sleep apnea, unspecified: Secondary | ICD-10-CM | POA: Diagnosis not present

## 2019-10-04 DIAGNOSIS — E1122 Type 2 diabetes mellitus with diabetic chronic kidney disease: Secondary | ICD-10-CM | POA: Diagnosis not present

## 2019-10-04 DIAGNOSIS — M19042 Primary osteoarthritis, left hand: Secondary | ICD-10-CM | POA: Diagnosis not present

## 2019-10-04 DIAGNOSIS — I429 Cardiomyopathy, unspecified: Secondary | ICD-10-CM | POA: Diagnosis not present

## 2019-10-04 DIAGNOSIS — M47819 Spondylosis without myelopathy or radiculopathy, site unspecified: Secondary | ICD-10-CM | POA: Diagnosis not present

## 2019-10-04 DIAGNOSIS — M109 Gout, unspecified: Secondary | ICD-10-CM | POA: Diagnosis not present

## 2019-10-04 DIAGNOSIS — I5023 Acute on chronic systolic (congestive) heart failure: Secondary | ICD-10-CM | POA: Diagnosis not present

## 2019-10-04 DIAGNOSIS — Z96653 Presence of artificial knee joint, bilateral: Secondary | ICD-10-CM | POA: Diagnosis not present

## 2019-10-04 DIAGNOSIS — I130 Hypertensive heart and chronic kidney disease with heart failure and stage 1 through stage 4 chronic kidney disease, or unspecified chronic kidney disease: Secondary | ICD-10-CM | POA: Diagnosis not present

## 2019-10-04 DIAGNOSIS — E1140 Type 2 diabetes mellitus with diabetic neuropathy, unspecified: Secondary | ICD-10-CM | POA: Diagnosis not present

## 2019-10-04 DIAGNOSIS — Z981 Arthrodesis status: Secondary | ICD-10-CM | POA: Diagnosis not present

## 2019-10-04 DIAGNOSIS — Z9181 History of falling: Secondary | ICD-10-CM | POA: Diagnosis not present

## 2019-10-04 DIAGNOSIS — Z9889 Other specified postprocedural states: Secondary | ICD-10-CM | POA: Diagnosis not present

## 2019-10-04 DIAGNOSIS — M19071 Primary osteoarthritis, right ankle and foot: Secondary | ICD-10-CM | POA: Diagnosis not present

## 2019-10-04 DIAGNOSIS — M19041 Primary osteoarthritis, right hand: Secondary | ICD-10-CM | POA: Diagnosis not present

## 2019-10-04 DIAGNOSIS — N183 Chronic kidney disease, stage 3 unspecified: Secondary | ICD-10-CM | POA: Diagnosis not present

## 2019-10-06 DIAGNOSIS — E1122 Type 2 diabetes mellitus with diabetic chronic kidney disease: Secondary | ICD-10-CM | POA: Diagnosis not present

## 2019-10-06 DIAGNOSIS — M19072 Primary osteoarthritis, left ankle and foot: Secondary | ICD-10-CM | POA: Diagnosis not present

## 2019-10-06 DIAGNOSIS — E1140 Type 2 diabetes mellitus with diabetic neuropathy, unspecified: Secondary | ICD-10-CM | POA: Diagnosis not present

## 2019-10-06 DIAGNOSIS — Z96653 Presence of artificial knee joint, bilateral: Secondary | ICD-10-CM | POA: Diagnosis not present

## 2019-10-06 DIAGNOSIS — M19071 Primary osteoarthritis, right ankle and foot: Secondary | ICD-10-CM | POA: Diagnosis not present

## 2019-10-06 DIAGNOSIS — E7800 Pure hypercholesterolemia, unspecified: Secondary | ICD-10-CM | POA: Diagnosis not present

## 2019-10-06 DIAGNOSIS — G894 Chronic pain syndrome: Secondary | ICD-10-CM | POA: Diagnosis not present

## 2019-10-06 DIAGNOSIS — M19042 Primary osteoarthritis, left hand: Secondary | ICD-10-CM | POA: Diagnosis not present

## 2019-10-06 DIAGNOSIS — I130 Hypertensive heart and chronic kidney disease with heart failure and stage 1 through stage 4 chronic kidney disease, or unspecified chronic kidney disease: Secondary | ICD-10-CM | POA: Diagnosis not present

## 2019-10-06 DIAGNOSIS — K219 Gastro-esophageal reflux disease without esophagitis: Secondary | ICD-10-CM | POA: Diagnosis not present

## 2019-10-06 DIAGNOSIS — I5023 Acute on chronic systolic (congestive) heart failure: Secondary | ICD-10-CM | POA: Diagnosis not present

## 2019-10-06 DIAGNOSIS — Z9181 History of falling: Secondary | ICD-10-CM | POA: Diagnosis not present

## 2019-10-06 DIAGNOSIS — M109 Gout, unspecified: Secondary | ICD-10-CM | POA: Diagnosis not present

## 2019-10-06 DIAGNOSIS — M19041 Primary osteoarthritis, right hand: Secondary | ICD-10-CM | POA: Diagnosis not present

## 2019-10-06 DIAGNOSIS — I429 Cardiomyopathy, unspecified: Secondary | ICD-10-CM | POA: Diagnosis not present

## 2019-10-06 DIAGNOSIS — N183 Chronic kidney disease, stage 3 unspecified: Secondary | ICD-10-CM | POA: Diagnosis not present

## 2019-10-06 DIAGNOSIS — Z9889 Other specified postprocedural states: Secondary | ICD-10-CM | POA: Diagnosis not present

## 2019-10-06 DIAGNOSIS — G4730 Sleep apnea, unspecified: Secondary | ICD-10-CM | POA: Diagnosis not present

## 2019-10-06 DIAGNOSIS — Z981 Arthrodesis status: Secondary | ICD-10-CM | POA: Diagnosis not present

## 2019-10-06 DIAGNOSIS — M47819 Spondylosis without myelopathy or radiculopathy, site unspecified: Secondary | ICD-10-CM | POA: Diagnosis not present

## 2019-10-11 ENCOUNTER — Ambulatory Visit: Payer: Medicare Other | Admitting: Cardiology

## 2019-10-15 ENCOUNTER — Observation Stay (HOSPITAL_COMMUNITY)
Admission: EM | Admit: 2019-10-15 | Discharge: 2019-10-16 | Disposition: A | Discharge status: Home / Self Care | Payer: Medicare Other | Attending: Family Medicine | Admitting: Family Medicine

## 2019-10-15 ENCOUNTER — Encounter (HOSPITAL_COMMUNITY): Payer: Self-pay | Admitting: Emergency Medicine

## 2019-10-15 ENCOUNTER — Other Ambulatory Visit: Payer: Self-pay

## 2019-10-15 ENCOUNTER — Emergency Department (HOSPITAL_COMMUNITY): Payer: Medicare Other

## 2019-10-15 DIAGNOSIS — E7800 Pure hypercholesterolemia, unspecified: Secondary | ICD-10-CM | POA: Diagnosis not present

## 2019-10-15 DIAGNOSIS — F329 Major depressive disorder, single episode, unspecified: Secondary | ICD-10-CM | POA: Diagnosis not present

## 2019-10-15 DIAGNOSIS — R0902 Hypoxemia: Secondary | ICD-10-CM | POA: Diagnosis not present

## 2019-10-15 DIAGNOSIS — E1122 Type 2 diabetes mellitus with diabetic chronic kidney disease: Secondary | ICD-10-CM | POA: Diagnosis not present

## 2019-10-15 DIAGNOSIS — I5023 Acute on chronic systolic (congestive) heart failure: Secondary | ICD-10-CM | POA: Diagnosis not present

## 2019-10-15 DIAGNOSIS — I509 Heart failure, unspecified: Secondary | ICD-10-CM

## 2019-10-15 DIAGNOSIS — E119 Type 2 diabetes mellitus without complications: Secondary | ICD-10-CM

## 2019-10-15 DIAGNOSIS — G894 Chronic pain syndrome: Secondary | ICD-10-CM | POA: Insufficient documentation

## 2019-10-15 DIAGNOSIS — G4733 Obstructive sleep apnea (adult) (pediatric): Secondary | ICD-10-CM | POA: Diagnosis not present

## 2019-10-15 DIAGNOSIS — K219 Gastro-esophageal reflux disease without esophagitis: Secondary | ICD-10-CM | POA: Insufficient documentation

## 2019-10-15 DIAGNOSIS — I130 Hypertensive heart and chronic kidney disease with heart failure and stage 1 through stage 4 chronic kidney disease, or unspecified chronic kidney disease: Principal | ICD-10-CM | POA: Insufficient documentation

## 2019-10-15 DIAGNOSIS — R52 Pain, unspecified: Secondary | ICD-10-CM | POA: Diagnosis not present

## 2019-10-15 DIAGNOSIS — M545 Low back pain: Secondary | ICD-10-CM | POA: Insufficient documentation

## 2019-10-15 DIAGNOSIS — F32A Depression, unspecified: Secondary | ICD-10-CM | POA: Diagnosis present

## 2019-10-15 DIAGNOSIS — I10 Essential (primary) hypertension: Secondary | ICD-10-CM | POA: Diagnosis present

## 2019-10-15 DIAGNOSIS — Z743 Need for continuous supervision: Secondary | ICD-10-CM | POA: Diagnosis not present

## 2019-10-15 DIAGNOSIS — F17210 Nicotine dependence, cigarettes, uncomplicated: Secondary | ICD-10-CM | POA: Diagnosis not present

## 2019-10-15 DIAGNOSIS — I428 Other cardiomyopathies: Secondary | ICD-10-CM | POA: Insufficient documentation

## 2019-10-15 DIAGNOSIS — Z20822 Contact with and (suspected) exposure to covid-19: Secondary | ICD-10-CM | POA: Insufficient documentation

## 2019-10-15 DIAGNOSIS — I110 Hypertensive heart disease with heart failure: Secondary | ICD-10-CM | POA: Diagnosis not present

## 2019-10-15 DIAGNOSIS — E1140 Type 2 diabetes mellitus with diabetic neuropathy, unspecified: Secondary | ICD-10-CM | POA: Diagnosis not present

## 2019-10-15 DIAGNOSIS — R609 Edema, unspecified: Secondary | ICD-10-CM | POA: Diagnosis not present

## 2019-10-15 DIAGNOSIS — Z79899 Other long term (current) drug therapy: Secondary | ICD-10-CM | POA: Insufficient documentation

## 2019-10-15 DIAGNOSIS — N183 Chronic kidney disease, stage 3 unspecified: Secondary | ICD-10-CM | POA: Diagnosis not present

## 2019-10-15 DIAGNOSIS — R0602 Shortness of breath: Secondary | ICD-10-CM | POA: Diagnosis not present

## 2019-10-15 DIAGNOSIS — E785 Hyperlipidemia, unspecified: Secondary | ICD-10-CM | POA: Insufficient documentation

## 2019-10-15 DIAGNOSIS — E1165 Type 2 diabetes mellitus with hyperglycemia: Secondary | ICD-10-CM | POA: Diagnosis not present

## 2019-10-15 DIAGNOSIS — Z96653 Presence of artificial knee joint, bilateral: Secondary | ICD-10-CM | POA: Insufficient documentation

## 2019-10-15 DIAGNOSIS — Z7984 Long term (current) use of oral hypoglycemic drugs: Secondary | ICD-10-CM | POA: Diagnosis not present

## 2019-10-15 DIAGNOSIS — F17200 Nicotine dependence, unspecified, uncomplicated: Secondary | ICD-10-CM | POA: Diagnosis present

## 2019-10-15 LAB — CBC WITH DIFFERENTIAL/PLATELET
Abs Immature Granulocytes: 0.03 10*3/uL (ref 0.00–0.07)
Basophils Absolute: 0 10*3/uL (ref 0.0–0.1)
Basophils Relative: 1 %
Eosinophils Absolute: 0.1 10*3/uL (ref 0.0–0.5)
Eosinophils Relative: 1 %
HCT: 37.4 % (ref 36.0–46.0)
Hemoglobin: 11 g/dL — ABNORMAL LOW (ref 12.0–15.0)
Immature Granulocytes: 1 %
Lymphocytes Relative: 15 %
Lymphs Abs: 1 10*3/uL (ref 0.7–4.0)
MCH: 28.6 pg (ref 26.0–34.0)
MCHC: 29.4 g/dL — ABNORMAL LOW (ref 30.0–36.0)
MCV: 97.4 fL (ref 80.0–100.0)
Monocytes Absolute: 0.5 10*3/uL (ref 0.1–1.0)
Monocytes Relative: 7 %
Neutro Abs: 5 10*3/uL (ref 1.7–7.7)
Neutrophils Relative %: 75 %
Platelets: 213 10*3/uL (ref 150–400)
RBC: 3.84 MIL/uL — ABNORMAL LOW (ref 3.87–5.11)
RDW: 15.4 % (ref 11.5–15.5)
WBC: 6.6 10*3/uL (ref 4.0–10.5)
nRBC: 0 % (ref 0.0–0.2)

## 2019-10-15 LAB — BASIC METABOLIC PANEL
Anion gap: 11 (ref 5–15)
BUN: 46 mg/dL — ABNORMAL HIGH (ref 6–20)
CO2: 28 mmol/L (ref 22–32)
Calcium: 8.7 mg/dL — ABNORMAL LOW (ref 8.9–10.3)
Chloride: 99 mmol/L (ref 98–111)
Creatinine, Ser: 1.77 mg/dL — ABNORMAL HIGH (ref 0.44–1.00)
GFR calc Af Amer: 38 mL/min — ABNORMAL LOW (ref >60)
GFR calc non Af Amer: 32 mL/min — ABNORMAL LOW (ref >60)
Glucose, Bld: 243 mg/dL — ABNORMAL HIGH (ref 70–99)
Potassium: 4.4 mmol/L (ref 3.5–5.1)
Sodium: 138 mmol/L (ref 135–145)

## 2019-10-15 LAB — TROPONIN I (HIGH SENSITIVITY): Troponin I (High Sensitivity): 21 ng/L — ABNORMAL HIGH (ref <18)

## 2019-10-15 LAB — BRAIN NATRIURETIC PEPTIDE: B Natriuretic Peptide: 562.8 pg/mL — ABNORMAL HIGH (ref 0.0–100.0)

## 2019-10-15 MED ORDER — FUROSEMIDE 10 MG/ML IJ SOLN
40.0000 mg | INTRAMUSCULAR | Status: AC
  Administered 2019-10-15: 23:00:00 40 mg via INTRAVENOUS
  Filled 2019-10-15: qty 4

## 2019-10-15 NOTE — ED Notes (Signed)
Purewick placed on pt. 

## 2019-10-15 NOTE — ED Provider Notes (Signed)
Medical screening examination/treatment/procedure(s) were conducted as a shared visit with non-physician practitioner(s) and myself.  I personally evaluated the patient during the encounter.  Clinical Impression:   Final diagnoses:  Acute on chronic congestive heart failure, unspecified heart failure type ([place])    Very obese -edematous female presents to the hospital with a complaint of increasing swelling and shortness of breath.  On exam the patient is morbidly obese with severe pitting edema bilateral legs.  She can move her cell from the stretcher to the hospital gurney but this takes approximately 5 minutes and significant effort.  On exam, the patient has a weight of 204 kg, she has a pulse of 90, oxygen is 99% however the patient becomes dyspneic very easily.  She will likely need to be admitted for ongoing diuresis.   EKG Interpretation  Date/Time:  Saturday October 15 2019 22:20:56 EST Ventricular Rate:  87 PR Interval:    QRS Duration: 170 QT Interval:  431 QTC Calculation: 519 R Axis:   -91 Text Interpretation: Sinus rhythm Nonspecific IVCD with LAD Consider anterior infarct since last tracing no significant change Confirmed by Noemi Chapel [phone]) on 10/15/2019 10:56:04 PM          Noemi Chapel, MD 10/16/19 [place]

## 2019-10-15 NOTE — ED Provider Notes (Signed)
[facility] EMERGENCY DEPARTMENT Provider Note   CSN: [id number] Arrival date & time: 10/15/19  2209     History Chief Complaint  Patient presents with  . Shortness of Breath    [name] is a 54 y.o. female.  The history is provided by the patient and medical records.  Shortness of Breath   54 year old female with history of arthritis, cardiomyopathy, CHF with estimated EF of 35 to 44%, diabetic neuropathy, chronic back pain, GERD, hyperlipidemia, hypertension, diabetes, morbid obesity, presenting to the ED with shortness of breath.  She was discharged from the hospital earlier this month after CHF exacerbation, thinks "I was released too early".  States upon discharge she was sent home on the same dose of Lasix she was previously on and was doing okay but has gotten significantly worse over the past 3 days.  She now has pitting edema up to the level of her abdomen.  She reports severe shortness of breath with any type of exertional activity, cannot even make it halfway across the room at home.  She does have nighttime orthopnea, is pretty much sleeping upright now.  She has not had any cough, fever, sick contacts, or known Covid exposures.  She had recent Covid testing that was negative along with the rest of her family.  She is followed by cardiology, Dr. Virgina Jock.  O2 sats on arrival 94%, improved to 99% on 2L.  Generally does not use home O2 but feels more comfortable with it in place currently.  Past Medical History:  Diagnosis Date  . Arthritis    "hands, arms, feet, back, knees" (02/17/2018)  . Cardiomyopathy [facility])    From Dr. Bonney Roussel office notes from 01/13/18  . CHF (congestive heart failure) ([place])   . Chronic lower back pain   . Diabetic neuropathy ([place])   . Family history of adverse reaction to anesthesia    Mother is hard to arouse and blood pressure drops  . GERD (gastroesophageal reflux disease)   . High cholesterol   . Hypertension   .  Sleep apnea    "need new machine"  - does not use a cpap (02/17/2018)  . Type II diabetes mellitus ([place])    no longer on medications (02/17/2018)    Patient Active Problem List   Diagnosis Date Noted  . Acute on chronic systolic CHF (congestive heart failure) ([place]) 09/11/2019  . AKI (acute kidney injury) ([place]) 09/11/2019  . Chronic pain syndrome 09/11/2019  . Heart failure ([place]) 09/11/2019  . No-show for appointment 07/08/2019  . Chronic systolic heart failure ([place]) 04/26/2019  . S/P revision of total knee, right 05/14/2018  . Dislocated knee, right, sequela   . S/P revision of total knee 02/17/2018  . Failed total right knee replacement ([place])   . Chronic renal insufficiency, stage 2 (mild) 11/10/2017  . Acute CHF (congestive heart failure) ([place]) 11/10/2017  . Failed total knee arthroplasty, sequela 08/31/2017  . Lower extremity cellulitis 01/03/2013  . Diabetes mellitus ([place]) 01/03/2013  . Morbid obesity ([place]) 11/12/2006  . TOBACCO DEPENDENCE 11/12/2006  . DEPRESSIVE DISORDER, NOS 11/12/2006  . Essential hypertension 11/12/2006  . GASTROESOPHAGEAL REFLUX, NO ESOPHAGITIS 11/12/2006  . ACNE 11/12/2006  . OSTEOARTHRITIS, LOWER LEG 11/12/2006  . APNEA, SLEEP 11/12/2006    Past Surgical History:  Procedure Laterality Date  . ANTERIOR CERVICAL DECOMP/DISCECTOMY FUSION  03/06/2010   C4-7 w/corpectomy/notes 03/17/2010  . BACK SURGERY    . CARPAL TUNNEL RELEASE Right   . HARDWARE REVISION  04/10/2010   cervical hardware revision screw replacement C4/notes 04/12/2010  . JOINT REPLACEMENT    . LAPAROSCOPIC CHOLECYSTECTOMY  1999  . LUMBAR LAMINECTOMY/DECOMPRESSION MICRODISCECTOMY  09/2010   Archie Endo 10/05/2010  . MULTIPLE EXTRACTIONS WITH ALVEOLOPLASTY [name] 01/19/2015   Procedure: MULTIPLE EXTRACTIONS Three, Four, Five, Seven, eight, nine, ten, eleven, twelve, fourteen, eighteen, twenty-one, twenty-eight, twenty-nine, thirty-one with Alveoloplasty.  ;  Surgeon: Diona Browner, DDS;  Location: [place];  Service: Oral Surgery;  Laterality: [name];  . REPLACEMENT TOTAL KNEE BILATERAL Bilateral 10/2007-03/2008   "left-right"  . TOENAIL EXCISION Bilateral    great toes  . TONSILLECTOMY    . TOTAL KNEE REVISION Right 02/17/2018  . TOTAL KNEE REVISION Right 02/17/2018   Procedure: RIGHT TOTAL KNEE REVISION;  Surgeon: Newt Minion, MD;  Location: [place];  Service: Orthopedics;  Laterality: Right;  . TOTAL KNEE REVISION Right 05/14/2018   Procedure: RIGHT TOTAL KNEE REVISION TO HINGED KNEE;  Surgeon: Newt Minion, MD;  Location: [place];  Service: Orthopedics;  Laterality: Right;     OB History   No obstetric history on file.     Family History  Problem Relation Age of Onset  . Kidney disease Mother   . Congestive Heart Failure Mother   . Hypertension Father     Social History   Tobacco Use  . Smoking status: Current Every Day Smoker    Packs/day: 0.50    Years: 32.00    Pack years: 16.00    Types: Cigarettes  . Smokeless tobacco: Never Used  Substance Use Topics  . Alcohol use: Not Currently  . Drug use: Not Currently    Types: Marijuana    Home Medications Prior to Admission medications   Medication Sig Start Date End Date Taking? Authorizing Provider  albuterol (PROVENTIL) (2.5 MG/3ML) 0.083% nebulizer solution Take 2.5 mg by nebulization every 4 (four) hours as needed for wheezing.  08/22/19   [provider]  allopurinol (ZYLOPRIM) 300 MG tablet Take 300 mg by mouth daily.  02/03/19   [provider]  ALPRAZolam Duanne Moron) 0.25 MG tablet Take 0.25 mg by mouth 2 (two) times daily as needed for anxiety.  02/03/19   [provider]  buprenorphine (BUTRANS) 20 MCG/HR PTWK patch Place 1 patch onto the skin once a week. 08/23/19   [provider]  furosemide (LASIX) 40 MG tablet Take 1 tablet (40 mg total) by mouth 2 (two) times daily. 09/19/19 09/18/20  Geradine Girt, DO  gabapentin (NEURONTIN) 100 MG capsule Take 100 mg by mouth 3 (three) times  daily. 08/22/19   [provider]  isosorbide-hydrALAZINE (BIDIL) 20-37.5 MG tablet Take 2 tablets by mouth 3 (three) times daily. 09/26/19   Miquel Dunn, NP  linagliptin (TRADJENTA) 5 MG TABS tablet Take 1 tablet (5 mg total) by mouth daily. 09/20/19   Geradine Girt, DO  metoprolol succinate (TOPROL-XL) 25 MG 24 hr tablet TAKE 1 TABLET(25 MG) BY MOUTH DAILY WITH OR IMMEDIATELY FOLLOWING A MEAL 09/27/19   Miquel Dunn, NP  [facility] 4 MG/0.1ML LIQD nasal spray kit Place 0.4 mg into the nose as needed (for overdose).  12/28/18   [provider]  oxyCODONE-acetaminophen (PERCOCET) 10-325 MG tablet Take 1 tablet by mouth every 8 (eight) hours as needed for pain. Patient taking differently: Take 1 tablet by mouth 4 (four) times daily as needed for pain.  09/30/18   Rayburn, Neta Mends, PA-C  sodium zirconium cyclosilicate (LOKELMA) 10 g PACK packet Take  10 g by mouth daily. 09/19/19   Geradine Girt, DO  Vitamin D, Ergocalciferol, (DRISDOL) 1.25 MG (50000 UT) CAPS capsule Take 50,000 Units by mouth every Sunday. 07/26/19   [provider]  XTAMPZA ER 36 MG C12A Take 36 mg by mouth 2 (two) times daily.  08/25/19   [provider]    Allergies    Patient has no known allergies.  Review of Systems   Review of Systems  Respiratory: Positive for shortness of breath.   All other systems reviewed and are negative.   Physical Exam Updated Vital Signs BP 128/80 (BP Location: Left Arm)   Pulse 90   Temp 98.1 F (36.7 C) (Oral)   Resp 20   LMP 11/09/2012   SpO2 99%   Physical Exam Vitals and nursing note reviewed.  Constitutional:      Appearance: She is well-developed.     Comments: Morbidly obese  HENT:     Head: Normocephalic and atraumatic.  Eyes:     Conjunctiva/sclera: Conjunctivae normal.     Pupils: Pupils are equal, round, and reactive to light.  Cardiovascular:     Rate and Rhythm: Normal rate and regular rhythm.     Heart  sounds: Normal heart sounds.  Pulmonary:     Effort: Pulmonary effort is normal.     Breath sounds: Rales present. No decreased breath sounds, wheezing or rhonchi.  Abdominal:     General: Bowel sounds are normal.     Palpations: Abdomen is soft.  Musculoskeletal:        General: Normal range of motion.     Cervical back: Normal range of motion.     Comments: Pitting edema of BLE up to level of abdomen  Skin:    General: Skin is warm and dry.  Neurological:     Mental Status: She is alert and oriented to person, place, and time.     ED Results / Procedures / Treatments   Labs (all labs ordered are listed, but only abnormal results are displayed) Labs Reviewed  CBC WITH DIFFERENTIAL/PLATELET - Abnormal; Notable for the following components:      Result Value   RBC 3.84 (*)    Hemoglobin 11.0 (*)    MCHC 29.4 (*)    All other components within normal limits  BASIC METABOLIC PANEL - Abnormal; Notable for the following components:   Glucose, Bld 243 (*)    BUN 46 (*)    Creatinine, Ser 1.77 (*)    Calcium 8.7 (*)    GFR calc non Af Amer 32 (*)    GFR calc Af Amer 38 (*)    All other components within normal limits  BRAIN NATRIURETIC PEPTIDE - Abnormal; Notable for the following components:   B Natriuretic Peptide 562.8 (*)    All other components within normal limits  TROPONIN I (HIGH SENSITIVITY) - Abnormal; Notable for the following components:   Troponin I (High Sensitivity) 21 (*)    All other components within normal limits  SARS CORONAVIRUS 2 (TAT 6-24 HRS)    EKG EKG Interpretation  Date/Time:  Saturday October 15 2019 22:20:56 EST Ventricular Rate:  87 PR Interval:    QRS Duration: 170 QT Interval:  431 QTC Calculation: 519 R Axis:   -91 Text Interpretation: Sinus rhythm Nonspecific IVCD with LAD Consider anterior infarct since last tracing no significant change Confirmed by Noemi Chapel [phone]) on 10/15/2019 10:56:04 PM   Radiology DG [facility] 1  View  Result  Date: 10/15/2019 CLINICAL DATA:  Shortness of breath. EXAM: PORTABLE CHEST 1 VIEW COMPARISON:  September 10, 2019 FINDINGS: There is no evidence of focal consolidation, pleural effusion or pneumothorax. Prominence of the perihilar pulmonary vasculature is seen. The cardiac silhouette is markedly enlarged and unchanged in size. Radiopaque fusion plate and screws are seen along the lower cervical spine. The visualized skeletal structures are otherwise unremarkable. IMPRESSION: 1. Stable cardiomegaly with mild to moderate severity pulmonary vascular congestion. Electronically Signed   By: Virgina Norfolk M.D.   On: 10/15/2019 22:48    Procedures Procedures (including critical care time)  Medications Ordered in ED Medications  furosemide (LASIX) injection 40 mg (40 mg Intravenous Given 10/15/19 2312)    ED Course  I have reviewed the triage vital signs and the nursing notes.  Pertinent labs & imaging results that were available during my care of the patient were reviewed by me and considered in my medical decision making (see chart for details).    MDM Rules/Calculators/A&P  54 y.o. F here with SOB and LE edema up to level of her abdomen.  She reports severe exertional dyspnea along with night time orthopnea over the past 3 days.  Does not use home O2, was 94% on RA here.  Has been compliant with her lasix.  She is AAOx3 here, currently using 2L supplemental O2 for comfort.    Work-up here consistent with fluid overload.  BNP 562.  Trop 21, likely mild demand ischemia.  CXR with vascular congestion.  She has been given IV lasix here.  Will admit for ongoing care.  COVID screen pending but reports recent negative testing.  Discussed with Dr. Criss Alvine-- will admit for ongoing care.  Final Clinical Impression(s) / ED Diagnoses Final diagnoses:  Acute on chronic congestive heart failure, unspecified heart failure type [facility])    Rx / DC Orders ED Discharge Orders    None        Larene Pickett, PA-C 10/16/19 0229    Noemi Chapel, MD 10/16/19 [phone]

## 2019-10-15 NOTE — ED Triage Notes (Signed)
Pt to ED via [facility] with c/o shortness of breath.  Pt st's she has been swelling and shortness of breath has became worse over past 3 days.

## 2019-10-16 DIAGNOSIS — I5023 Acute on chronic systolic (congestive) heart failure: Secondary | ICD-10-CM

## 2019-10-16 DIAGNOSIS — N183 Chronic kidney disease, stage 3 unspecified: Secondary | ICD-10-CM

## 2019-10-16 DIAGNOSIS — Z7401 Bed confinement status: Secondary | ICD-10-CM | POA: Diagnosis not present

## 2019-10-16 DIAGNOSIS — N1832 Chronic kidney disease, stage 3b: Secondary | ICD-10-CM

## 2019-10-16 DIAGNOSIS — R52 Pain, unspecified: Secondary | ICD-10-CM | POA: Diagnosis not present

## 2019-10-16 DIAGNOSIS — G894 Chronic pain syndrome: Secondary | ICD-10-CM | POA: Diagnosis not present

## 2019-10-16 DIAGNOSIS — R0902 Hypoxemia: Secondary | ICD-10-CM | POA: Diagnosis not present

## 2019-10-16 DIAGNOSIS — I5033 Acute on chronic diastolic (congestive) heart failure: Secondary | ICD-10-CM | POA: Insufficient documentation

## 2019-10-16 DIAGNOSIS — M2550 Pain in unspecified joint: Secondary | ICD-10-CM | POA: Diagnosis not present

## 2019-10-16 DIAGNOSIS — I509 Heart failure, unspecified: Secondary | ICD-10-CM

## 2019-10-16 DIAGNOSIS — I130 Hypertensive heart and chronic kidney disease with heart failure and stage 1 through stage 4 chronic kidney disease, or unspecified chronic kidney disease: Secondary | ICD-10-CM | POA: Diagnosis not present

## 2019-10-16 DIAGNOSIS — M5489 Other dorsalgia: Secondary | ICD-10-CM | POA: Diagnosis not present

## 2019-10-16 LAB — GLUCOSE, CAPILLARY
Glucose-Capillary: 197 mg/dL — ABNORMAL HIGH (ref 70–99)
Glucose-Capillary: 172 mg/dL — ABNORMAL HIGH (ref 70–99)
Glucose-Capillary: 146 mg/dL — ABNORMAL HIGH (ref 70–99)

## 2019-10-16 LAB — BASIC METABOLIC PANEL
Anion gap: 7 (ref 5–15)
BUN: 45 mg/dL — ABNORMAL HIGH (ref 6–20)
CO2: 30 mmol/L (ref 22–32)
Calcium: 8.6 mg/dL — ABNORMAL LOW (ref 8.9–10.3)
Chloride: 102 mmol/L (ref 98–111)
Creatinine, Ser: 1.76 mg/dL — ABNORMAL HIGH (ref 0.44–1.00)
GFR calc Af Amer: 38 mL/min — ABNORMAL LOW (ref >60)
GFR calc non Af Amer: 33 mL/min — ABNORMAL LOW (ref >60)
Glucose, Bld: 200 mg/dL — ABNORMAL HIGH (ref 70–99)
Potassium: 5.1 mmol/L (ref 3.5–5.1)
Sodium: 139 mmol/L (ref 135–145)

## 2019-10-16 LAB — CBC
HCT: 35 % — ABNORMAL LOW (ref 36.0–46.0)
Hemoglobin: 10.2 g/dL — ABNORMAL LOW (ref 12.0–15.0)
MCH: 28.4 pg (ref 26.0–34.0)
MCHC: 29.1 g/dL — ABNORMAL LOW (ref 30.0–36.0)
MCV: 97.5 fL (ref 80.0–100.0)
Platelets: 188 10*3/uL (ref 150–400)
RBC: 3.59 MIL/uL — ABNORMAL LOW (ref 3.87–5.11)
RDW: 15.4 % (ref 11.5–15.5)
WBC: 7 10*3/uL (ref 4.0–10.5)
nRBC: 0 % (ref 0.0–0.2)

## 2019-10-16 LAB — MAGNESIUM: Magnesium: 1.7 mg/dL (ref 1.7–2.4)

## 2019-10-16 LAB — SARS CORONAVIRUS 2 (TAT 6-24 HRS): SARS Coronavirus 2: NEGATIVE

## 2019-10-16 MED ORDER — ALPRAZOLAM 0.25 MG PO TABS: 0.2500 mg | ORAL_TABLET | Freq: Two times a day (BID) | ORAL | Status: DC | PRN

## 2019-10-16 MED ORDER — ACETAMINOPHEN 650 MG RE SUPP: 650.0000 mg | Freq: Four times a day (QID) | RECTAL | Status: DC | PRN

## 2019-10-16 MED ORDER — SODIUM CHLORIDE 0.9% FLUSH: 3.0000 mL | INTRAVENOUS | Status: DC | PRN

## 2019-10-16 MED ORDER — ISOSORB DINITRATE-HYDRALAZINE 20-37.5 MG PO TABS
2.0000 | ORAL_TABLET | Freq: Three times a day (TID) | ORAL | Status: DC
  Administered 2019-10-16 (×2): 2 via ORAL
  Filled 2019-10-16 (×2): qty 2

## 2019-10-16 MED ORDER — SODIUM CHLORIDE 0.9 % IV SOLN: 250.0000 mL | INTRAVENOUS | Status: DC | PRN

## 2019-10-16 MED ORDER — OXYCODONE-ACETAMINOPHEN 10-325 MG PO TABS: 1.0000 | ORAL_TABLET | Freq: Four times a day (QID) | ORAL | Status: DC

## 2019-10-16 MED ORDER — ENOXAPARIN SODIUM 100 MG/ML ~~LOC~~ SOLN
100.0000 mg | Freq: Every day | SUBCUTANEOUS | Status: DC
  Administered 2019-10-16: 100 mg via SUBCUTANEOUS
  Filled 2019-10-16: qty 1

## 2019-10-16 MED ORDER — OXYCODONE-ACETAMINOPHEN 5-325 MG PO TABS
1.0000 | ORAL_TABLET | Freq: Three times a day (TID) | ORAL | Status: DC
  Administered 2019-10-16 (×3): 1 via ORAL
  Filled 2019-10-16 (×3): qty 1

## 2019-10-16 MED ORDER — ALLOPURINOL 300 MG PO TABS
300.0000 mg | ORAL_TABLET | Freq: Every day | ORAL | Status: DC
  Administered 2019-10-16: 10:00:00 300 mg via ORAL
  Filled 2019-10-16: qty 1

## 2019-10-16 MED ORDER — OXYCODONE HCL 5 MG PO TABS
5.0000 mg | ORAL_TABLET | Freq: Three times a day (TID) | ORAL | Status: DC
  Administered 2019-10-16 (×3): 5 mg via ORAL
  Filled 2019-10-16 (×3): qty 1

## 2019-10-16 MED ORDER — INSULIN ASPART 100 UNIT/ML ~~LOC~~ SOLN
0.0000 [IU] | Freq: Three times a day (TID) | SUBCUTANEOUS | Status: DC
  Administered 2019-10-16 (×2): 4 [IU] via SUBCUTANEOUS

## 2019-10-16 MED ORDER — LINAGLIPTIN 5 MG PO TABS
5.0000 mg | ORAL_TABLET | Freq: Every day | ORAL | Status: DC
  Administered 2019-10-16: 5 mg via ORAL
  Filled 2019-10-16: qty 1

## 2019-10-16 MED ORDER — ACETAMINOPHEN 325 MG PO TABS: 650.0000 mg | ORAL_TABLET | Freq: Four times a day (QID) | ORAL | Status: DC | PRN

## 2019-10-16 MED ORDER — OXYCODONE HCL ER 10 MG PO T12A: 40.0000 mg | EXTENDED_RELEASE_TABLET | Freq: Two times a day (BID) | ORAL | Status: DC

## 2019-10-16 MED ORDER — NICOTINE 14 MG/24HR TD PT24
14.0000 mg | MEDICATED_PATCH | Freq: Every day | TRANSDERMAL | Status: DC
  Administered 2019-10-16: 10:00:00 14 mg via TRANSDERMAL
  Filled 2019-10-16: qty 1

## 2019-10-16 MED ORDER — ONDANSETRON HCL 4 MG/2ML IJ SOLN: 4.0000 mg | Freq: Four times a day (QID) | INTRAMUSCULAR | Status: DC | PRN

## 2019-10-16 MED ORDER — ALBUTEROL SULFATE (2.5 MG/3ML) 0.083% IN NEBU: 2.5000 mg | INHALATION_SOLUTION | RESPIRATORY_TRACT | Status: DC | PRN

## 2019-10-16 MED ORDER — ONDANSETRON HCL 4 MG PO TABS: 4.0000 mg | ORAL_TABLET | Freq: Four times a day (QID) | ORAL | Status: DC | PRN

## 2019-10-16 MED ORDER — GABAPENTIN 100 MG PO CAPS: 100.0000 mg | ORAL_CAPSULE | Freq: Every evening | ORAL | Status: DC | PRN

## 2019-10-16 MED ORDER — FUROSEMIDE 10 MG/ML IJ SOLN
40.0000 mg | Freq: Two times a day (BID) | INTRAMUSCULAR | Status: DC
  Administered 2019-10-16: 07:00:00 40 mg via INTRAVENOUS
  Filled 2019-10-16: qty 4

## 2019-10-16 MED ORDER — SODIUM CHLORIDE 0.9% FLUSH
3.0000 mL | Freq: Two times a day (BID) | INTRAVENOUS | Status: DC
  Administered 2019-10-16 (×2): 3 mL via INTRAVENOUS

## 2019-10-16 MED ORDER — BUPRENORPHINE 20 MCG/HR TD PTWK
1.0000 | MEDICATED_PATCH | TRANSDERMAL | Status: DC
  Administered 2019-10-16: 10:00:00 1 via TRANSDERMAL
  Filled 2019-10-16: qty 1

## 2019-10-16 MED ORDER — METOPROLOL SUCCINATE ER 25 MG PO TB24
25.0000 mg | ORAL_TABLET | Freq: Every day | ORAL | Status: DC
  Administered 2019-10-16: 10:00:00 25 mg via ORAL
  Filled 2019-10-16: qty 1

## 2019-10-16 MED ORDER — INSULIN ASPART 100 UNIT/ML ~~LOC~~ SOLN: 0.0000 [IU] | Freq: Every day | SUBCUTANEOUS | Status: DC

## 2019-10-16 MED ORDER — VITAMIN D (ERGOCALCIFEROL) 1.25 MG (50000 UNIT) PO CAPS: 50000.0000 [IU] | ORAL_CAPSULE | ORAL | Status: DC

## 2019-10-16 MED ORDER — PANTOPRAZOLE SODIUM 40 MG PO TBEC
40.0000 mg | DELAYED_RELEASE_TABLET | Freq: Two times a day (BID) | ORAL | Status: DC
  Administered 2019-10-16: 10:00:00 40 mg via ORAL
  Filled 2019-10-16: qty 1

## 2019-10-16 NOTE — Care Management (Signed)
PTAR forms printed and given to nurse who will verify that someone is home to accept patient and call PT[facility] when ready.

## 2019-10-16 NOTE — Progress Notes (Addendum)
[facility] called for patient. Patients daughter is at home and able to help receive patient. Discharge instructions given and IVs removed. Patient has belongings and walker that she brought on admission.    Patient picked up by [facility] at [place]

## 2019-10-16 NOTE — Progress Notes (Signed)
Discharge orders reviewed and discussed with patient. Heart Failure education reviewed and folder given with review of HF management. Will remind patient of follow up recommendations and discuss medication schedule. Patient aware of discharge and is going to inform her daughter for transport home.

## 2019-10-16 NOTE — Discharge Instructions (Signed)
Heart Failure Action Plan A heart failure action plan helps you understand what to do when you have symptoms of heart failure. Follow the plan that was created by you and your health care provider. Review your plan each time you visit your health care provider. Red zone These signs and symptoms mean you should get medical help right away:  You have trouble breathing when resting.  You have a dry cough that is getting worse.  You have swelling or pain in your legs or abdomen that is getting worse.  You suddenly gain more than 2-3 lb (0.9-1.4 kg) in a day, or more than 5 lb (2.3 kg) in one week. This amount may be more or less depending on your condition.  You have trouble staying awake or you feel confused.  You have chest pain.  You do not have an appetite.  You pass out. If you experience any of these symptoms:  Call your local emergency services (911 in the [place] right away or seek help at the emergency department of the nearest hospital. Yellow zone These signs and symptoms mean your condition may be getting worse and you should make some changes:  You have trouble breathing when you are active or you need to sleep with extra pillows.  You have swelling in your legs or abdomen.  You gain 2-3 lb (0.9-1.4 kg) in one day, or 5 lb (2.3 kg) in one week. This amount may be more or less depending on your condition.  You get tired easily.  You have trouble sleeping.  You have a dry cough. If you experience any of these symptoms:  Contact your health care provider within the next day.  Your health care provider may adjust your medicines. Green zone These signs mean you are doing well and can continue what you are doing:  You do not have shortness of breath.  You have very little swelling or no new swelling.  Your weight is stable (no gain or loss).  You have a normal activity level.  You do not have chest pain or any other new symptoms. Follow these instructions at  home:  Take over-the-counter and prescription medicines only as told by your health care provider.  Weigh yourself daily. Your target weight is __________ lb (__________ kg). ? Call your health care provider if you gain more than __________ lb (__________ kg) in a day, or more than __________ lb (__________ kg) in one week.  Eat a heart-healthy diet. Work with a diet and nutrition specialist (dietitian) to create an eating plan that is best for you.  Keep all follow-up visits as told by your health care provider. This is important. Where to find more information  American Heart Association: www.heart.org Summary  Follow the action plan that was created by you and your health care provider.  Get help right away if you have any symptoms in the Red zone. This information is not intended to replace advice given to you by your health care provider. Make sure you discuss any questions you have with your health care provider. Document Revised: 08/14/2017 Document Reviewed: 10/11/2016 Elsevier Patient Education  2020 [place]. Heart Failure, Self Care   Heart Failure Eating Plan Heart failure, also called congestive heart failure, occurs when your heart does not pump blood well enough to meet your body's needs for oxygen-rich blood. Heart failure is a long-term (chronic) condition. Living with heart failure can be challenging. However, following your health care provider's instructions about a healthy  lifestyle and working with a diet and nutrition specialist (dietitian) to choose the right foods may help to improve your symptoms. What are tips for following this plan? Reading food labels  Check food labels for the amount of sodium per serving. Choose foods that have less than 140 mg (milligrams) of sodium in each serving.  Check food labels for the number of calories per serving. This is important if you need to limit your daily calorie intake to lose weight.  Check food labels for the  serving size. If you eat more than one serving, you will be eating more sodium and calories than what is listed on the label.  Look for foods that are labeled as "sodium-free," "very low sodium," or "low sodium." ? Foods labeled as "reduced sodium" or "lightly salted" may still have more sodium than what is recommended for you. Cooking  Avoid adding salt when cooking. Ask your health care provider or dietitian before using salt substitutes.  Season food with salt-free seasonings, spices, or herbs. Check the label of seasoning mixes to make sure they do not contain salt.  Cook with heart-healthy oils, such as olive, canola, soybean, or sunflower oil.  Do not fry foods. Cook foods using low-fat methods, such as baking, boiling, grilling, and broiling.  Limit unhealthy fats when cooking by: ? Removing the skin from poultry, such as chicken. ? Removing all visible fats from meats. ? Skimming the fat off from stews, soups, and gravies before serving them. Meal planning   Limit your intake of: ? Processed, canned, or pre-packaged foods. ? Foods that are high in trans fat, such as fried foods. ? Sweets, desserts, sugary drinks, and other foods with added sugar. ? Full-fat dairy products, such as whole milk.  Eat a balanced diet that includes: ? 4-5 servings of fruit each day and 4-5 servings of vegetables each day. At each meal, try to fill half of your plate with fruits and vegetables. ? Up to 6-8 servings of whole grains each day. ? Up to 2 servings of lean meat, poultry, or fish each day. One serving of meat is equal to 3 oz. This is about the same size as a deck of cards. ? 2 servings of low-fat dairy each day. ? Heart-healthy fats. Healthy fats called omega-3 fatty acids are found in foods such as flaxseed and cold-water fish like sardines, salmon, and mackerel.  Aim to eat 25-35 g (grams) of fiber a day. Foods that are high in fiber include apples, broccoli, carrots, beans, peas, and  whole grains.  Do not add salt or condiments that contain salt (such as soy sauce) to foods before eating.  When eating at a restaurant, ask that your food be prepared with less salt or no salt, if possible.  Try to eat 2 or more vegetarian meals each week.  Eat more home-cooked food and eat less restaurant, buffet, and fast food. General information  Do not eat more than 2,300 mg of salt (sodium) a day. The amount of sodium that is recommended for you may be lower, depending on your condition.  Maintain a healthy body weight as directed. Ask your health care provider what a healthy weight is for you. ? Check your weight every day. ? Work with your health care provider and dietitian to make a plan that is right for you to lose weight or maintain your current weight.  Limit how much fluid you drink. Ask your health care provider or dietitian how much fluid you  can have each day.  Limit or avoid alcohol as told by your health care provider or dietitian. Recommended foods The items listed may not be a complete list. Talk with your dietitian about what dietary choices are best for you. Fruits All fresh, frozen, and canned fruits. Dried fruits, such as raisins, prunes, and cranberries. Vegetables All fresh vegetables. Vegetables that are frozen without sauce or added salt. Low-sodium or sodium-free canned vegetables. Grains Bread with less than 80 mg of sodium per slice. Whole-wheat pasta, quinoa, and brown rice. Oats and oatmeal. Barley. McDonald. Grits and cream of wheat. Whole-grain and whole-wheat cold cereal. Meats and other protein foods Lean cuts of meat. Skinless chicken and [place]. Fish with high omega-3 fatty acids, such as salmon, sardines, and other cold-water fishes. Eggs. Dried beans, peas, and edamame. Unsalted nuts and nut butters. Dairy Low-fat or nonfat (skim) milk and dried milk. Rice milk, soy milk, and almond milk. Low-fat or nonfat yogurt. Small amounts of reduced-sodium  block cheese. Low-sodium cottage cheese. Fats and oils Olive, canola, soybean, flaxseed, or sunflower oil. Avocado. Sweets and desserts Apple sauce. Granola bars. Sugar-free pudding and gelatin. Frozen fruit bars. Seasoning and other foods Fresh and dried herbs. Lemon or lime juice. Vinegar. Low-sodium ketchup. Salt-free marinades, salad dressings, sauces, and seasonings. The items listed above may not be a complete list of foods and beverages you can eat. Contact a dietitian for more information. Foods to avoid The items listed may not be a complete list. Talk with your dietitian about what dietary choices are best for you. Fruits Fruits that are dried with sodium-containing preservatives. Vegetables Canned vegetables. Frozen vegetables with sauce or seasonings. Creamed vegetables. [place] fries. Onion rings. Pickled vegetables and sauerkraut. Grains Bread with more than 80 mg of sodium per slice. Hot or cold cereal with more than 140 mg sodium per serving. Salted pretzels and crackers. Pre-packaged breadcrumbs. Bagels, croissants, and biscuits. Meats and other protein foods Ribs and chicken wings. Bacon, ham, pepperoni, bologna, salami, and packaged luncheon meats. Hot dogs, bratwurst, and sausage. Canned meat. Smoked meat and fish. Salted nuts and seeds. Dairy Whole milk, half-and-half, and cream. Buttermilk. Processed cheese, cheese spreads, and cheese curds. Regular cottage cheese. Feta cheese. Shredded cheese. String cheese. Fats and oils Butter, lard, shortening, ghee, and bacon fat. Canned and packaged gravies. Seasoning and other foods Onion salt, garlic salt, table salt, and sea salt. Marinades. Regular salad dressings. Relishes, pickles, and olives. Meat flavorings and tenderizers, and bouillon cubes. Horseradish, ketchup, and mustard. Worcestershire sauce. Teriyaki sauce, soy sauce (including reduced sodium). Hot sauce and Tabasco sauce. Steak sauce, fish sauce, oyster sauce, and  cocktail sauce. Taco seasonings. Barbecue sauce. Tartar sauce. The items listed above may not be a complete list of foods and beverages you should avoid. Contact a dietitian for more information. Summary  A heart failure eating plan includes changes that limit your intake of sodium and unhealthy fat, and it may help you lose weight or maintain a healthy weight. Your health care provider may also recommend limiting how much fluid you drink.  Most people with heart failure should eat no more than 2,300 mg of salt (sodium) a day. The amount of sodium that is recommended for you may be lower, depending on your condition.  Contact your health care provider or dietitian before making any major changes to your diet. This information is not intended to replace advice given to you by your health care provider. Make sure you discuss any questions you have  with your health care provider. Document Revised: 10/28/2018 Document Reviewed: 01/16/2017 Elsevier Patient Education  [place].   Heart Failure, Self Care Heart failure is a serious condition. This document explains the things you need to do to take care of yourself after a heart failure diagnosis. You may be asked to change your diet, take certain medicines, and make other lifestyle changes in order to stay as healthy as possible. Your health care provider may also give you more specific instructions. If you have problems or questions, contact your health care provider. What are the risks? Having heart failure puts you at higher risk for certain problems. These problems can get worse if you do not take good care of yourself. Problems may include:  Blood clotting problems. This may cause a stroke.  Damage to the kidneys, liver, or lungs.  Abnormal heart rhythms. Supplies needed:  Scale for monitoring weight.  Blood pressure monitor.  Notebook.  Medicines. How to care for yourself when you have heart failure Medicines Take  over-the-counter and prescription medicines only as told by your health care provider. Medicines reduce the workload of your heart, slow the progression of heart failure, and improve symptoms. Take your medicines every day.  Do not stop taking your medicine unless your health care provider tells you to do so.  Do not skip any dose of medicine.  Refill your prescriptions before you run out of medicine. Eating and drinking   Eat heart-healthy foods. Talk with a dietitian to make an eating plan that is right for you. ? Choose foods that contain no trans fat and are low in saturated fat and cholesterol. Healthy choices include fresh or frozen fruits and vegetables, fish, lean meats, legumes, fat-free or low-fat dairy products, and whole-grain or high-fiber foods. ? Limit salt (sodium) if told by your health care provider. Sodium restriction may reduce symptoms of heart failure. Ask a dietitian to recommend heart-healthy seasonings. ? Use healthy cooking methods instead of frying. Healthy methods include roasting, grilling, broiling, baking, poaching, steaming, and stir-frying.  Limit your fluid intake, if directed by your health care provider. Fluid restriction may reduce symptoms of heart failure. Alcohol use  Do not drink alcohol if: ? Your health care provider tells you not to drink. ? Your heart was damaged by alcohol, or you have severe heart failure. ? You are pregnant, may be pregnant, or are planning to become pregnant.  If you drink alcohol: ? Limit how much you use to:  0-1 drink a day for women.  0-2 drinks a day for men. ? Be aware of how much alcohol is in your drink. In the [place] one drink equals one 12 oz bottle of beer (355 mL), one 5 oz glass of wine (148 mL), or one 1 oz glass of hard liquor (44 mL). Lifestyle   Do not use any products that contain nicotine or tobacco, such as cigarettes, e-cigarettes, and chewing tobacco. If you need help quitting, ask your health  care provider. ? Do not use nicotine gum or patches before talking to your health care provider.  Do not use illegal drugs.  Work with your health care provider to safely reach the right body weight.  Do physical activity if told by your health care provider. Talk to your health care provider before you begin an exercise if: ? You are an older adult. ? You have severe heart failure.  Learn to manage stress. If you need help to do this, ask your health care  provider.  Participate in or seek rehabilitation as needed to keep or improve your independence and quality of life.  Plan rest periods when you get tired. Monitoring important information   Weigh yourself every day. This will help you to notice if too much fluid is building up in your body. ? Weigh yourself every morning after you urinate and before you eat breakfast. ? Wear the same amount of clothing each time you weigh yourself. ? Record your daily weight. Provide your health care provider with your weight record.  Monitor and record your pulse and blood pressure as told by your health care provider. Dealing with extreme temperatures  If the weather is extremely hot: ? Avoid vigorous physical activity. ? Use air conditioning or fans, or find a cooler location. ? Avoid caffeine and alcohol. ? Wear loose-fitting, lightweight, and light-colored clothing.  If the weather is extremely cold: ? Avoid vigorous activity. ? Layer your clothes. ? Wear mittens or gloves, a hat, and a scarf when you go outside. ? Avoid alcohol. Follow these instructions at home:  Stay up to date with vaccines. Pneumococcal and flu (influenza) vaccines are especially important in preventing infections of the airways.  Keep all follow-up visits as told by your health care provider. This is important. Contact a health care provider if you:  Have a rapid weight gain.  Have increasing shortness of breath.  Are unable to participate in your usual  physical activities.  Get tired easily.  Cough more than normal, especially with physical activity.  Lose your appetite or feel nauseous.  Have any swelling or more swelling in areas such as your hands, feet, ankles, or abdomen.  Are unable to sleep because it is hard to breathe.  Feel like your heart is beating quickly (palpitations).  Become dizzy or light-headed when you stand up. Get help right away if you:  Have trouble breathing.  Notice or your family notices a change in your awareness, such as having trouble staying awake or concentrating.  Have pain or discomfort in your chest.  Have an episode of fainting (syncope). These symptoms may represent a serious problem that is an emergency. Do not wait to see if the symptoms will go away. Get medical help right away. Call your local emergency services (911 in the [place] Do not drive yourself to the hospital. Summary  Heart failure is a serious condition. To care for yourself, you may be asked to change your diet, take certain medicines, and make other lifestyle changes.  Take your medicines every day. Do not stop taking them unless your health care provider tells you to do so.  Eat heart-healthy foods, such as fresh or frozen fruits and vegetables, fish, lean meats, legumes, fat-free or low-fat dairy products, and whole-grain or high-fiber foods.  Ask your health care provider if you have any alcohol restrictions. You may have to stop drinking alcohol if you have severe heart failure.  Contact your health care provider if you notice problems, such as rapid weight gain or a fast heartbeat. Get help right away if you faint, or have chest pain or trouble breathing. This information is not intended to replace advice given to you by your health care provider. Make sure you discuss any questions you have with your health care provider. Document Revised: 12/14/2018 Document Reviewed: 12/15/2018 Elsevier Patient Education  [place]. Heart failure is a serious condition. This sheet explains things you need to do to take care of yourself at home. To  help you stay as healthy as possible, you may be asked to change your diet, take certain medicines, and make other changes in your life. Your doctor may also give you more specific instructions. If you have problems or questions, call your doctor. What are the risks? Having heart failure makes it more likely for you to have some problems. These problems can get worse if you do not take good care of yourself. Problems may include:  Blood clotting problems. This may cause a stroke.  Damage to the kidneys, liver, or lungs.  Abnormal heart rhythms. Supplies needed:  Scale for weighing yourself.  Blood pressure monitor.  Notebook.  Medicines. How to care for yourself when you have heart failure Medicines Take over-the-counter and prescription medicines only as told by your doctor. Take your medicines every day.  Do not stop taking your medicine unless your doctor tells you to do so.  Do not skip any medicines.  Get your prescriptions refilled before you run out of medicine. This is important. Eating and drinking   Eat heart-healthy foods. Talk with a diet specialist (dietitian) to create an eating plan.  Choose foods that: ? Have no trans fat. ? Are low in saturated fat and cholesterol.  Choose healthy foods, such as: ? Fresh or frozen fruits and vegetables. ? Fish. ? Low-fat (lean) meats. ? Legumes, such as beans, peas, and lentils. ? Fat-free or low-fat dairy products. ? Whole-grain foods. ? High-fiber foods.  Limit salt (sodium) if told by your doctor. Ask your diet specialist to tell you which seasonings are healthy for your heart.  Cook in healthy ways instead of frying. Healthy ways of cooking include roasting, grilling, broiling, baking, poaching, steaming, and stir-frying.  Limit how much fluid you drink, if told by your  doctor. Alcohol use  Do not drink alcohol if: ? Your doctor tells you not to drink. ? Your heart was damaged by alcohol, or you have very bad heart failure. ? You are pregnant, may be pregnant, or are planning to become pregnant.  If you drink alcohol: ? Limit how much you use to:  0-1 drink a day for women.  0-2 drinks a day for men. ? Be aware of how much alcohol is in your drink. In the [place] one drink equals one 12 oz bottle of beer (355 mL), one 5 oz glass of wine (148 mL), or one 1 oz glass of hard liquor (44 mL). Lifestyle   Do not use any products that contain nicotine or tobacco, such as cigarettes, e-cigarettes, and chewing tobacco. If you need help quitting, ask your doctor. ? Do not use nicotine gum or patches before talking to your doctor.  Do not use illegal drugs.  Lose weight if told by your doctor.  Do physical activity if told by your doctor. Talk to your doctor before you begin an exercise if: ? You are an older adult. ? You have very bad heart failure.  Learn to manage stress. If you need help, ask your doctor.  Get rehab (rehabilitation) to help you stay independent and to help with your quality of life.  Plan time to rest when you get tired. Check weight and blood pressure   Weigh yourself every day. This will help you to know if fluid is building up in your body. ? Weigh yourself every morning after you pee (urinate) and before you eat breakfast. ? Wear the same amount of clothing each time. ? Write down your daily weight. Give your  record to your doctor.  Check and write down your blood pressure as told by your doctor.  Check your pulse as told by your doctor. Dealing with very hot and very cold weather  If it is very hot: ? Avoid activities that take a lot of energy. ? Use air conditioning or fans, or find a cooler place. ? Avoid caffeine and alcohol. ? Wear clothing that is loose-fitting, lightweight, and light-colored.  If it is very  cold: ? Avoid activities that take a lot of energy. ? Layer your clothes. ? Wear mittens or gloves, a hat, and a scarf when you go outside. ? Avoid alcohol. Follow these instructions at home:  Stay up to date with shots (vaccines). Get pneumococcal and flu (influenza) shots.  Keep all follow-up visits as told by your doctor. This is important. Contact a doctor if:  You gain weight quickly.  You have increasing shortness of breath.  You cannot do your normal activities.  You get tired easily.  You cough a lot.  You don't feel like eating or feel like you may vomit (nauseous).  You become puffy (swell) in your hands, feet, ankles, or belly (abdomen).  You cannot sleep well because it is hard to breathe.  You feel like your heart is beating fast (palpitations).  You get dizzy when you stand up. Get help right away if:  You have trouble breathing.  You or someone else notices a change in your behavior, such as having trouble staying awake.  You have chest pain or discomfort.  You pass out (faint). These symptoms may be an emergency. Do not wait to see if the symptoms will go away. Get medical help right away. Call your local emergency services (911 in the [place] Do not drive yourself to the hospital. Summary  Heart failure is a serious condition. To care for yourself, you may have to change your diet, take medicines, and make other lifestyle changes.  Take your medicines every day. Do not stop taking them unless your doctor tells you to do so.  Eat heart-healthy foods, such as fresh or frozen fruits and vegetables, fish, lean meats, legumes, fat-free or low-fat dairy products, and whole-grain or high-fiber foods.  Ask your doctor if you can drink alcohol. You may have to stop alcohol use if you have very bad heart failure.  Contact your doctor if you gain weight quickly or feel that your heart is beating too fast. Get help right away if you pass out, or have chest pain  or trouble breathing. This information is not intended to replace advice given to you by your health care provider. Make sure you discuss any questions you have with your health care provider. Document Revised: 12/14/2018 Document Reviewed: 12/15/2018 Elsevier Patient Education  [place].

## 2019-10-16 NOTE — Care Management CC44 (Signed)
Condition Code 44 Documentation Completed  Patient Details  Name: [name] MRN: [id number] Date of Birth: [birth year]   Condition Code 44 given:  Yes Patient signature on Condition Code 44 notice:  Yes Documentation of 2 MD's agreement:  Yes Code 44 added to claim:  Yes    Carles Collet, RN 10/16/2019, 11:19 AM

## 2019-10-16 NOTE — Discharge Summary (Signed)
Physician Discharge Summary  [name] YDX:[id number] DOB: [birth year] DOA: 10/15/2019  PCP: Vonna Drafts, FNP  Admit date: 10/15/2019 Discharge date: 10/16/2019  Admitted From: Home Disposition: Home  Recommendations for Outpatient Follow-up:  1. Follow up with PCP in 1-2 weeks 2. Please obtain BMP/CBC in one week 3. Please follow up on the following pending results:  Home Health: None.  Per patient, she has established home health care already Equipment/Devices: None  Discharge Condition: Stable CODE STATUS: Full code Diet recommendation: Low-sodium/cardiac  Subjective: Seen and examined.  Feels much better with very minimal shortness of breath.  No new complaint.  HPI: [name] is a 54 y.o. female with medical history significant of Nonischemic cardiomyopathy congestive heart failure systolic dysfunction ejection fraction 33%, hypertension, diabetes mellitus type 2, hyperlipidemia, morbid obesity, GERD, came with a chief complaint of shortness of breath, abdominal discomfort, and increase of peripheral edema.  She was recently in the hospital for the same.  She was discharged and was doing better until few days ago when he developed more shortness of breath and looks like he is gaining weight.  Shortness his breath is more with exertion.  She was recently seen in cardiology office.  She is taking BiDil and beta-blocker Entresto was stopped because of her kidney function.. She denies any fever chills cough vomiting diarrhea.  ED Course: Patient in moderate respiratory distress. She is saturating okay with no oxygen..  Does not wear oxygen at home Letter  was documentation her sats dropped to 88% without oxygen. Was given Lasix IV 40 mg once. BNP 562 troponin 21  Brief/Interim Summary: Patient was admitted under hospital service for acute on chronic systolic congestive heart failure and acute hypoxic respiratory failure.  Initially her oxygen saturation was 88% on  room air and she required 2 L of nasal cannula.  She was started on IV Lasix.  She takes oral Lasix 40 mg p.o. twice daily at home.  When seen this morning, she feels much better.  She was on room air and saturating 92 to 94%.  After talking to her and getting more information, she tells me that she eats a lot of junk food and does not watch her sodium.  She was educated and counseled regarding that.  Now that she is not hypoxic, she is medically stable for discharge and she is agreeable with that plan.  She told me that she already has home health care coming.  Will resume all her home medications.  Discharge Diagnoses:  Active Problems:   Morbid obesity ([place])   TOBACCO DEPENDENCE   Depression   Essential hypertension   GASTROESOPHAGEAL REFLUX, NO ESOPHAGITIS   OSA (obstructive sleep apnea)   Diabetes mellitus (HCC)   Acute on chronic systolic congestive heart failure (HCC)   Chronic pain syndrome   CKD (chronic kidney disease), stage III   Acute exacerbation of CHF (congestive heart failure) [facility])    Discharge Instructions  Discharge Instructions    Discharge patient   Complete by: As directed    Discharge disposition: 01-Home or Self Care   Discharge patient date: 10/16/2019     Allergies as of 10/16/2019   No Known Allergies     Medication List    TAKE these medications   albuterol (2.5 MG/3ML) 0.083% nebulizer solution Commonly known as: PROVENTIL Take 2.5 mg by nebulization every 4 (four) hours as needed for wheezing.   allopurinol 300 MG tablet Commonly known as: ZYLOPRIM Take 300 mg by mouth  daily.   ALPRAZolam 0.25 MG tablet Commonly known as: XANAX Take 0.25 mg by mouth 2 (two) times daily as needed for anxiety.   BiDil 20-37.5 MG tablet Generic drug: isosorbide-hydrALAZINE Take 2 tablets by mouth 3 (three) times daily.   buprenorphine 20 MCG/HR Ptwk patch Commonly known as: BUTRANS Place 1 patch onto the skin once a week.   furosemide 40 MG  tablet Commonly known as: Lasix Take 1 tablet (40 mg total) by mouth 2 (two) times daily.   gabapentin 100 MG capsule Commonly known as: NEURONTIN Take 100 mg by mouth at bedtime as needed (pain).   linagliptin 5 MG Tabs tablet Commonly known as: TRADJENTA Take 1 tablet (5 mg total) by mouth daily.   metoprolol succinate 25 MG 24 hr tablet Commonly known as: TOPROL-XL TAKE 1 TABLET(25 MG) BY MOUTH DAILY WITH OR IMMEDIATELY FOLLOWING A MEAL What changed: See the new instructions.   Narcan 4 MG/0.1ML Liqd nasal spray kit Generic drug: naloxone Place 0.4 mg into the nose as needed (for overdose).   oxyCODONE-acetaminophen 10-325 MG tablet Commonly known as: PERCOCET Take 1 tablet by mouth every 8 (eight) hours as needed for pain. What changed: when to take this   Vitamin D (Ergocalciferol) 1.25 MG (50000 UNIT) Caps capsule Commonly known as: DRISDOL Take 50,000 Units by mouth every Tuesday.   Xtampza ER 36 MG C12a Generic drug: oxyCODONE ER Take 36 mg by mouth 2 (two) times daily.      Follow-up Information    Vonna Drafts, FNP Follow up in 1 week(s).   Specialty: Nurse Practitioner Contact information: [place] [address] [phone]          No Known Allergies  Consultations: None   Procedures/Studies: DG Chest Port 1 View  Result Date: 10/15/2019 CLINICAL DATA:  Shortness of breath. EXAM: PORTABLE CHEST 1 VIEW COMPARISON:  September 10, 2019 FINDINGS: There is no evidence of focal consolidation, pleural effusion or pneumothorax. Prominence of the perihilar pulmonary vasculature is seen. The cardiac silhouette is markedly enlarged and unchanged in size. Radiopaque fusion plate and screws are seen along the lower cervical spine. The visualized skeletal structures are otherwise unremarkable. IMPRESSION: 1. Stable cardiomegaly with mild to moderate severity pulmonary vascular congestion. Electronically Signed   By: Virgina Norfolk M.D.   On: 10/15/2019 22:48      Discharge Exam: Vitals:   10/16/19 0650 10/16/19 0938  BP: 121/74 123/86  Pulse:  84  Resp:  20  Temp:  98.4 F (36.9 C)  SpO2:  91%   Vitals:   10/16/19 0203 10/16/19 0251 10/16/19 0650 10/16/19 0938  BP:  (!) 148/80 121/74 123/86  Pulse:  80  84  Resp:  16  20  Temp: 98.1 F (36.7 C) 98.3 F (36.8 C)  98.4 F (36.9 C)  TempSrc: Oral Oral  Oral  SpO2:    91%  Weight: (!) 208.7 kg (!) 208.7 kg    Height: _0  (1.6 m)       General: Pt is alert, awake, not in acute distress, morbidly obese Cardiovascular: RRR, S1/S2 +, no rubs, no gallops Respiratory: CTA bilaterally, no wheezing, no rhonchi Abdominal: Soft, NT, ND, bowel sounds + Extremities: +1-2 pitting edema bilateral lower extremity, chronic lymphedema, no cyanosis    The results of significant diagnostics from this hospitalization (including imaging, microbiology, ancillary and laboratory) are listed below for reference.     Microbiology: Recent Results (from the past 240  hour(s))  SARS CORONAVIRUS 2 (TAT 6-24 HRS) Nasopharyngeal Nasopharyngeal Swab     Status: None   Collection Time: 10/16/19 12:52 AM   Specimen: Nasopharyngeal Swab  Result Value Ref Range Status   SARS Coronavirus 2 NEGATIVE NEGATIVE Final    Comment: (NOTE) SARS-CoV-2 target nucleic acids are NOT DETECTED. The SARS-CoV-2 RNA is generally detectable in upper and lower respiratory specimens during the acute phase of infection. Negative results do not preclude SARS-CoV-2 infection, do not rule out co-infections with other pathogens, and should not be used as the sole basis for treatment or other patient management decisions. Negative results must be combined with clinical observations, patient history, and epidemiological information. The expected result is Negative. Fact Sheet for Patients: SugarRoll.be Fact Sheet for Healthcare  Providers: https://www.woods-mathews.com/ This test is not yet approved or cleared by the [place] FDA and  has been authorized for detection and/or diagnosis of SARS-CoV-2 by FDA under an Emergency Use Authorization (EUA). This EUA will remain  in effect (meaning this test can be used) for the duration of the COVID-19 declaration under Section 56 4(b)(1) of the Act, 21 U.S.C. section 360bbb-3(b)(1), unless the authorization is terminated or revoked sooner. Performed at [facility], [place] [address] [place], [place] [address]      Labs: BNP (last 3 results) Recent Labs    09/10/19 2341 10/15/19 2252  BNP 313.1* 176.1*   Basic Metabolic Panel: Recent Labs  Lab 10/15/19 2253 10/16/19 0458  NA 138 139  K 4.4 5.1  CL 99 102  CO2 28 30  GLUCOSE 243* 200*  BUN 46* 45*  CREATININE 1.77* 1.76*  CALCIUM 8.7* 8.6*  MG  --  1.7   Liver Function Tests: No results for input(s): AST, ALT, ALKPHOS, BILITOT, PROT, ALBUMIN in the last 168 hours. No results for input(s): LIPASE, AMYLASE in the last 168 hours. No results for input(s): AMMONIA in the last 168 hours. CBC: Recent Labs  Lab 10/15/19 2253 10/16/19 0458  WBC 6.6 7.0  NEUTROABS 5.0  --   HGB 11.0* 10.2*  HCT 37.4 35.0*  MCV 97.4 97.5  PLT 213 188   Cardiac Enzymes: No results for input(s): CKTOTAL, CKMB, CKMBINDEX, TROPONINI in the last 168 hours. BNP: Invalid input(s): POCBNP CBG: Recent Labs  Lab 10/16/19 0621 10/16/19 1105  GLUCAP 197* 172*   D-Dimer No results for input(s): DDIMER in the last 72 hours. Hgb A1c No results for input(s): HGBA1C in the last 72 hours. Lipid Profile No results for input(s): CHOL, HDL, LDLCALC, TRIG, CHOLHDL, LDLDIRECT in the last 72 hours. Thyroid function studies No results for input(s): TSH, T4TOTAL, T3FREE, THYROIDAB in the last 72 hours.  Invalid input(s): FREET3 Anemia work up No results for input(s): VITAMINB12, FOLATE, FERRITIN, TIBC, IRON,  RETICCTPCT in the last 72 hours. Urinalysis    Component Value Date/Time   COLORURINE YELLOW 12/31/2018 0457   APPEARANCEUR CLEAR 12/31/2018 0457   LABSPEC 1.009 12/31/2018 0457   PHURINE 5.0 12/31/2018 0457   GLUCOSEU >=500 (A) 12/31/2018 0457   HGBUR NEGATIVE 12/31/2018 0457   BILIRUBINUR NEGATIVE 12/31/2018 0457   KETONESUR NEGATIVE 12/31/2018 0457   PROTEINUR NEGATIVE 12/31/2018 0457   UROBILINOGEN 1.0 05/16/2013 1758   NITRITE POSITIVE (A) 12/31/2018 0457   LEUKOCYTESUR TRACE (A) 12/31/2018 0457   Sepsis Labs Invalid input(s): PROCALCITONIN,  WBC,  LACTICIDVEN Microbiology Recent Results (from the past 240 hour(s))  SARS CORONAVIRUS 2 (TAT 6-24 HRS) Nasopharyngeal Nasopharyngeal Swab     Status: None  Collection Time: 10/16/19 12:52 AM   Specimen: Nasopharyngeal Swab  Result Value Ref Range Status   SARS Coronavirus 2 NEGATIVE NEGATIVE Final    Comment: (NOTE) SARS-CoV-2 target nucleic acids are NOT DETECTED. The SARS-CoV-2 RNA is generally detectable in upper and lower respiratory specimens during the acute phase of infection. Negative results do not preclude SARS-CoV-2 infection, do not rule out co-infections with other pathogens, and should not be used as the sole basis for treatment or other patient management decisions. Negative results must be combined with clinical observations, patient history, and epidemiological information. The expected result is Negative. Fact Sheet for Patients: SugarRoll.be Fact Sheet for Healthcare Providers: https://www.woods-mathews.com/ This test is not yet approved or cleared by the [place] FDA and  has been authorized for detection and/or diagnosis of SARS-CoV-2 by FDA under an Emergency Use Authorization (EUA). This EUA will remain  in effect (meaning this test can be used) for the duration of the COVID-19 declaration under Section 56 4(b)(1) of the Act, 21 U.S.C. section  360bbb-3(b)(1), unless the authorization is terminated or revoked sooner. Performed at [facility], [place] [address] [place], [place] [address]      Time coordinating discharge: Over 30 minutes  SIGNED:   Darliss Cheney, MD  [facility] 10/16/2019, 12:51 PM  If 7PM-7AM, please contact night-coverage [email or web address]

## 2019-10-16 NOTE — H&P (Signed)
History and Physical    [name]:[id number] DOB: 04-28-1966 DOA: 10/15/2019  PCP: Vonna Drafts, FNP    Patient coming from: Home    Chief Complaint: Shortness of breath, peripheral edema, abdominal discomfort  HPI: [name] is a 54 y.o. female with medical history significant of Nonischemic cardiomyopathy congestive heart failure systolic dysfunction ejection fraction 33%, hypertension, diabetes mellitus type 2, hyperlipidemia, morbid obesity, GERD, came with a chief complaint of shortness of breath, abdominal discomfort, and increase of peripheral edema.  She was recently in the hospital for the same.  She was discharged and was doing better until few days ago when he developed more shortness of breath and looks like he is gaining weight.  Shortness his breath is more with exertion.  She was recently seen in cardiology office.  She is taking BiDil and beta-blocker Entresto was stopped because of her kidney function.. She denies any fever chills cough vomiting diarrhea.  ED Course: Patient in moderate respiratory distress. She is saturating okay with no oxygen..  Does not wear oxygen at home Letter  was documentation her sats dropped to 88% without oxygen. Was given Lasix IV 40 mg once. BNP 562 troponin 21 Review of Systems: As per HPI otherwise 10 point review of systems negative.  The exception of shortness of breath, tachypnea, hypoxia  Past Medical History:  Diagnosis Date  . Arthritis    "hands, arms, feet, back, knees" (02/17/2018)  . Cardiomyopathy [facility])    From Dr. Bonney Roussel office notes from 01/13/18  . CHF (congestive heart failure) ([place])   . Chronic lower back pain   . Diabetic neuropathy ([place])   . Family history of adverse reaction to anesthesia    Mother is hard to arouse and blood pressure drops  . GERD (gastroesophageal reflux disease)   . High cholesterol   . Hypertension   . Sleep apnea    "need new machine"  - does not use a cpap  (02/17/2018)  . Type II diabetes mellitus ([place])    no longer on medications (02/17/2018)    Past Surgical History:  Procedure Laterality Date  . ANTERIOR CERVICAL DECOMP/DISCECTOMY FUSION  03/06/2010   C4-7 w/corpectomy/notes 03/17/2010  . BACK SURGERY    . CARPAL TUNNEL RELEASE Right   . HARDWARE REVISION  04/10/2010   cervical hardware revision screw replacement C4/notes 04/12/2010  . JOINT REPLACEMENT    . LAPAROSCOPIC CHOLECYSTECTOMY  1999  . LUMBAR LAMINECTOMY/DECOMPRESSION MICRODISCECTOMY  09/2010   Archie Endo 10/05/2010  . MULTIPLE EXTRACTIONS WITH ALVEOLOPLASTY [name] 01/19/2015   Procedure: MULTIPLE EXTRACTIONS Three, Four, Five, Seven, eight, nine, ten, eleven, twelve, fourteen, eighteen, twenty-one, twenty-eight, twenty-nine, thirty-one with Alveoloplasty.  ;  Surgeon: Diona Browner, DDS;  Location: [facility];  Service: Oral Surgery;  Laterality: [name];  . REPLACEMENT TOTAL KNEE BILATERAL Bilateral 10/2007-03/2008   "left-right"  . TOENAIL EXCISION Bilateral    great toes  . TONSILLECTOMY    . TOTAL KNEE REVISION Right 02/17/2018  . TOTAL KNEE REVISION Right 02/17/2018   Procedure: RIGHT TOTAL KNEE REVISION;  Surgeon: Newt Minion, MD;  Location: [facility];  Service: Orthopedics;  Laterality: Right;  . TOTAL KNEE REVISION Right 05/14/2018   Procedure: RIGHT TOTAL KNEE REVISION TO HINGED KNEE;  Surgeon: Newt Minion, MD;  Location: [facility];  Service: Orthopedics;  Laterality: Right;     reports that she has been smoking cigarettes. She has a 16.00 pack-year smoking history. She has never used smokeless tobacco. She reports previous  alcohol use. She reports previous drug use. Drug: Marijuana.  No Known Allergies  Family History  Problem Relation Age of Onset  . Kidney disease Mother   . Congestive Heart Failure Mother   . Hypertension Father      Prior to Admission medications   Medication Sig Start Date End Date Taking? Authorizing Provider  albuterol (PROVENTIL) (2.5 MG/3ML) 0.083% nebulizer  solution Take 2.5 mg by nebulization every 4 (four) hours as needed for wheezing.  08/22/19  Yes [provider]  allopurinol (ZYLOPRIM) 300 MG tablet Take 300 mg by mouth daily.  02/03/19  Yes [provider]  ALPRAZolam (XANAX) 0.25 MG tablet Take 0.25 mg by mouth 2 (two) times daily as needed for anxiety.  02/03/19  Yes [provider]  buprenorphine (BUTRANS) 20 MCG/HR PTWK patch Place 1 patch onto the skin once a week. 08/23/19  Yes [provider]  furosemide (LASIX) 40 MG tablet Take 1 tablet (40 mg total) by mouth 2 (two) times daily. 09/19/19 09/18/20 Yes Vann, Jessica U, DO  gabapentin (NEURONTIN) 100 MG capsule Take 100 mg by mouth at bedtime as needed (pain).  08/22/19  Yes [provider]  isosorbide-hydrALAZINE (BIDIL) 20-37.5 MG tablet Take 2 tablets by mouth 3 (three) times daily. 09/26/19  Yes Miquel Dunn, NP  linagliptin (TRADJENTA) 5 MG TABS tablet Take 1 tablet (5 mg total) by mouth daily. 09/20/19  Yes Vann, Jessica U, DO  metoprolol succinate (TOPROL-XL) 25 MG 24 hr tablet TAKE 1 TABLET(25 MG) BY MOUTH DAILY WITH OR IMMEDIATELY FOLLOWING A MEAL Patient taking differently: Take 25 mg by mouth daily.  09/27/19  Yes Miquel Dunn, NP  [facility] 4 MG/0.1ML LIQD nasal spray kit Place 0.4 mg into the nose as needed (for overdose).  12/28/18  Yes [provider]  oxyCODONE-acetaminophen (PERCOCET) 10-325 MG tablet Take 1 tablet by mouth every 8 (eight) hours as needed for pain. Patient taking differently: Take 1 tablet by mouth in the morning, at noon, in the evening, and at bedtime.  09/30/18  Yes Rayburn, Neta Mends, PA-C  Vitamin D, Ergocalciferol, (DRISDOL) 1.25 MG (50000 UT) CAPS capsule Take 50,000 Units by mouth every Tuesday.  07/26/19  Yes [provider]  XTAMPZA ER 36 MG C12A Take 36 mg by mouth 2 (two) times daily.  08/25/19  Yes [provider]    Physical Exam: Vitals:   10/16/19 0115  10/16/19 0145 10/16/19 0203 10/16/19 0251  BP: 117/68 101/87  (!) 148/80  Pulse: 81 80  80  Resp: (!) _0 Temp:   98.1 F (36.7 C) 98.3 F (36.8 C)  TempSrc:   Oral Oral  SpO2: 91% 96%    Weight:   (!) 208.7 kg (!) 208.7 kg  Height:   _1  (1.6 m)     Constitutional: NAD, calm, comfortable Vitals:   10/16/19 0115 10/16/19 0145 10/16/19 0203 10/16/19 0251  BP: 117/68 101/87  (!) 148/80  Pulse: 81 80  80  Resp: (!) _2 Temp:   98.1 F (36.7 C) 98.3 F (36.8 C)  TempSrc:   Oral Oral  SpO2: 91% 96%    Weight:   (!) 208.7 kg (!) 208.7 kg  Height:   _3  (1.6 m)    Eyes: PERRL, lids and conjunctivae normal ENMT: Mucous membranes are moist. Posterior pharynx clear of any exudate or lesions.Normal dentition.  Neck: normal, supple, no masses, no thyromegaly Respiratory: clear to auscultation  bilaterally, no wheezing, +++ crackles. Normal respiratory effort. No accessory muscle use.  Cardiovascular: Regular rate and rhythm, no murmurs / rubs / gallops. ++++ extremity edema. 2+ pedal pulses. No carotid bruits.  Abdomen: no tenderness, no masses palpated. No hepatosplenomegaly. Bowel sounds positive.  Musculoskeletal: no clubbing / cyanosis. No joint deformity upper and lower extremities. Good ROM, no contractures. Normal muscle tone.  Skin: no rashes, lesions, ulcers. No induration Neurologic: CN 2-12 grossly intact. Sensation intact, DTR normal. Strength 5/5 in all 4.  Psychiatric: Normal judgment and insight. Alert and oriented x 3. Normal mood.    Labs on Admission: I have personally reviewed following labs and imaging studies  CBC: Recent Labs  Lab 10/15/19 2253  WBC 6.6  NEUTROABS 5.0  HGB 11.0*  HCT 37.4  MCV 97.4  PLT 353   Basic Metabolic Panel: Recent Labs  Lab 10/15/19 2253  NA 138  K 4.4  CL 99  CO2 28  GLUCOSE 243*  BUN 46*  CREATININE 1.77*  CALCIUM 8.7*   GFR: Estimated Creatinine Clearance: 67.4 mL/min (A) (by C-G formula based on  SCr of 1.77 mg/dL (H)). Liver Function Tests: No results for input(s): AST, ALT, ALKPHOS, BILITOT, PROT, ALBUMIN in the last 168 hours. No results for input(s): LIPASE, AMYLASE in the last 168 hours. No results for input(s): AMMONIA in the last 168 hours. Coagulation Profile: No results for input(s): INR, PROTIME in the last 168 hours. Cardiac Enzymes: No results for input(s): CKTOTAL, CKMB, CKMBINDEX, TROPONINI in the last 168 hours. BNP (last 3 results) No results for input(s): PROBNP in the last 8760 hours. HbA1C: No results for input(s): HGBA1C in the last 72 hours. CBG: No results for input(s): GLUCAP in the last 168 hours. Lipid Profile: No results for input(s): CHOL, HDL, LDLCALC, TRIG, CHOLHDL, LDLDIRECT in the last 72 hours. Thyroid Function Tests: No results for input(s): TSH, T4TOTAL, FREET4, T3FREE, THYROIDAB in the last 72 hours. Anemia Panel: No results for input(s): VITAMINB12, FOLATE, FERRITIN, TIBC, IRON, RETICCTPCT in the last 72 hours. Urine analysis:    Component Value Date/Time   COLORURINE YELLOW 12/31/2018 [place] 12/31/2018 0457   LABSPEC 1.009 12/31/2018 0457   PHURINE 5.0 12/31/2018 0457   GLUCOSEU >=500 (A) 12/31/2018 0457   HGBUR NEGATIVE 12/31/2018 [place] 12/31/2018 [place] 12/31/2018 0457   PROTEINUR NEGATIVE 12/31/2018 0457   UROBILINOGEN 1.0 05/16/2013 1758   NITRITE POSITIVE (A) 12/31/2018 0457   LEUKOCYTESUR TRACE (A) 12/31/2018 0457    Radiological Exams on Admission: DG Chest Port 1 View  Result Date: 10/15/2019 CLINICAL DATA:  Shortness of breath. EXAM: PORTABLE CHEST 1 VIEW COMPARISON:  September 10, 2019 FINDINGS: There is no evidence of focal consolidation, pleural effusion or pneumothorax. Prominence of the perihilar pulmonary vasculature is seen. The cardiac silhouette is markedly enlarged and unchanged in size. Radiopaque fusion plate and screws are seen along the lower cervical  spine. The visualized skeletal structures are otherwise unremarkable. IMPRESSION: 1. Stable cardiomegaly with mild to moderate severity pulmonary vascular congestion. Electronically Signed   By: Virgina Norfolk M.D.   On: 10/15/2019 22:48    EKG: Independently reviewed.  Sinus interventricular conduction delay no acute ST-T changes  Assessment and plan  Acute on chronic systolic congestive heart failure Patient with shortness of breath tachypneic hypoxic needs 2 L nasal cannula Saturation 88% on room air History of CHF ejection fraction 31% ischemic cardiomyopathy Takes Lasix 40 mg twice daily was  given 40 mg in emergency room Plan continue home medication switch to Lasix IV twice daily Check daily weights and intake/ output, 2 g sodium diet  Continue with BiDil, beta-blocker, Entresto on hold because of the kidney function  Chronic kidney disease stage III Mild improvement in creatinine and BUN Avoid nephrotoxins follow BUN and creatinine  Diabetes mellitus type 2 Hold p.o. medication Insulin sliding scale sensitivity factor  Essential hypertension Resume home medication  Morbid obesity Encourage lose weight  Sleep apnea CPAP  Assessment/Plan Active Problems:   Morbid obesity ([place])   TOBACCO DEPENDENCE   Depression   Essential hypertension   GASTROESOPHAGEAL REFLUX, NO ESOPHAGITIS   OSA (obstructive sleep apnea)   Diabetes mellitus (HCC)   Chronic pain syndrome   CKD (chronic kidney disease), stage III      DVT prophylaxis: Lovenox Code Status: Full code Family Communication: Need to call in the morning Disposition Plan: Home Consults called: Need to be called in the morning cardiology Admission status: Full admission   Lacey Dotson G Tamisha Nordstrom MD [facility]  If 7PM-7AM, please contact night-coverage [email or web address]   10/16/2019, 4:31 AM

## 2019-10-16 NOTE — Plan of Care (Signed)

## 2019-10-16 NOTE — Progress Notes (Signed)
Patient with short run of SVT. Patient has no complaints and HR now Sinus in the 80s. Labs reviewed and MD paged

## 2019-10-16 NOTE — ED Notes (Signed)
Pt requested sandwich and a cup of ice.  Both were given.  RN requested pt wear her O2 [place] b/c her O2 level was 88%.  She refused to place the tubing behind her ears however stated that she would hold it in place.  RN educated on the importance of wearing her O2.  Pt states she understands

## 2019-10-16 NOTE — Care Management Obs Status (Signed)
MEDICARE OBSERVATION STATUS NOTIFICATION   Patient Details  Name: [name] MRN: [id number] Date of Birth: [birth year]   Medicare Observation Status Notification Given:  Yes    Carles Collet, RN 10/16/2019, 11:19 AM

## 2019-10-17 ENCOUNTER — Telehealth: Payer: Self-pay

## 2019-10-17 NOTE — Telephone Encounter (Signed)
Ok called pt to inform her about her appt being switch to vv.

## 2019-10-17 NOTE — Telephone Encounter (Signed)
Pt called to  Inform us if she can make her office visit for to tomorrow as a vv instead because she is too swollen due to her fluids. Please advise.

## 2019-10-17 NOTE — Telephone Encounter (Signed)
That's fine

## 2019-10-18 ENCOUNTER — Other Ambulatory Visit: Payer: Self-pay | Admitting: Cardiology

## 2019-10-18 ENCOUNTER — Ambulatory Visit: Payer: Medicare Other | Admitting: Cardiology

## 2019-10-19 ENCOUNTER — Emergency Department (HOSPITAL_COMMUNITY)
Admission: EM | Admit: 2019-10-19 | Discharge: 2019-10-20 | Disposition: A | Discharge status: Home / Self Care | Payer: Medicare Other | Attending: Emergency Medicine | Admitting: Emergency Medicine

## 2019-10-19 DIAGNOSIS — Z96653 Presence of artificial knee joint, bilateral: Secondary | ICD-10-CM | POA: Insufficient documentation

## 2019-10-19 DIAGNOSIS — E1122 Type 2 diabetes mellitus with diabetic chronic kidney disease: Secondary | ICD-10-CM | POA: Insufficient documentation

## 2019-10-19 DIAGNOSIS — R4020 Unspecified coma: Secondary | ICD-10-CM | POA: Diagnosis not present

## 2019-10-19 DIAGNOSIS — Z79899 Other long term (current) drug therapy: Secondary | ICD-10-CM | POA: Insufficient documentation

## 2019-10-19 DIAGNOSIS — S299XXA Unspecified injury of thorax, initial encounter: Secondary | ICD-10-CM | POA: Diagnosis not present

## 2019-10-19 DIAGNOSIS — F17210 Nicotine dependence, cigarettes, uncomplicated: Secondary | ICD-10-CM | POA: Diagnosis not present

## 2019-10-19 DIAGNOSIS — E1165 Type 2 diabetes mellitus with hyperglycemia: Secondary | ICD-10-CM | POA: Diagnosis not present

## 2019-10-19 DIAGNOSIS — N183 Chronic kidney disease, stage 3 unspecified: Secondary | ICD-10-CM | POA: Diagnosis not present

## 2019-10-19 DIAGNOSIS — T50904A Poisoning by unspecified drugs, medicaments and biological substances, undetermined, initial encounter: Secondary | ICD-10-CM | POA: Diagnosis not present

## 2019-10-19 DIAGNOSIS — I10 Essential (primary) hypertension: Secondary | ICD-10-CM | POA: Diagnosis not present

## 2019-10-19 DIAGNOSIS — M542 Cervicalgia: Secondary | ICD-10-CM | POA: Diagnosis not present

## 2019-10-19 DIAGNOSIS — I509 Heart failure, unspecified: Secondary | ICD-10-CM | POA: Insufficient documentation

## 2019-10-19 DIAGNOSIS — F1910 Other psychoactive substance abuse, uncomplicated: Secondary | ICD-10-CM | POA: Insufficient documentation

## 2019-10-19 DIAGNOSIS — R519 Headache, unspecified: Secondary | ICD-10-CM | POA: Diagnosis not present

## 2019-10-19 DIAGNOSIS — R0602 Shortness of breath: Secondary | ICD-10-CM | POA: Diagnosis not present

## 2019-10-19 DIAGNOSIS — I130 Hypertensive heart and chronic kidney disease with heart failure and stage 1 through stage 4 chronic kidney disease, or unspecified chronic kidney disease: Secondary | ICD-10-CM | POA: Insufficient documentation

## 2019-10-19 DIAGNOSIS — T402X1A Poisoning by other opioids, accidental (unintentional), initial encounter: Secondary | ICD-10-CM | POA: Diagnosis not present

## 2019-10-19 DIAGNOSIS — S0990XA Unspecified injury of head, initial encounter: Secondary | ICD-10-CM | POA: Diagnosis not present

## 2019-10-19 DIAGNOSIS — S199XXA Unspecified injury of neck, initial encounter: Secondary | ICD-10-CM | POA: Diagnosis not present

## 2019-10-19 DIAGNOSIS — T40601A Poisoning by unspecified narcotics, accidental (unintentional), initial encounter: Secondary | ICD-10-CM | POA: Diagnosis not present

## 2019-10-19 DIAGNOSIS — Z743 Need for continuous supervision: Secondary | ICD-10-CM | POA: Diagnosis not present

## 2019-10-20 ENCOUNTER — Emergency Department (HOSPITAL_COMMUNITY): Payer: Medicare Other

## 2019-10-20 ENCOUNTER — Telehealth: Payer: Medicare Other | Admitting: Cardiology

## 2019-10-20 ENCOUNTER — Other Ambulatory Visit: Payer: Self-pay

## 2019-10-20 ENCOUNTER — Encounter (HOSPITAL_COMMUNITY): Payer: Self-pay | Admitting: Emergency Medicine

## 2019-10-20 VITALS — BP 159/43 | HR 95

## 2019-10-20 DIAGNOSIS — S199XXA Unspecified injury of neck, initial encounter: Secondary | ICD-10-CM | POA: Diagnosis not present

## 2019-10-20 DIAGNOSIS — I5023 Acute on chronic systolic (congestive) heart failure: Secondary | ICD-10-CM | POA: Diagnosis not present

## 2019-10-20 DIAGNOSIS — M19042 Primary osteoarthritis, left hand: Secondary | ICD-10-CM | POA: Diagnosis not present

## 2019-10-20 DIAGNOSIS — M542 Cervicalgia: Secondary | ICD-10-CM | POA: Diagnosis not present

## 2019-10-20 DIAGNOSIS — Z96653 Presence of artificial knee joint, bilateral: Secondary | ICD-10-CM | POA: Diagnosis not present

## 2019-10-20 DIAGNOSIS — I10 Essential (primary) hypertension: Secondary | ICD-10-CM

## 2019-10-20 DIAGNOSIS — T40601A Poisoning by unspecified narcotics, accidental (unintentional), initial encounter: Secondary | ICD-10-CM | POA: Diagnosis not present

## 2019-10-20 DIAGNOSIS — Z9889 Other specified postprocedural states: Secondary | ICD-10-CM | POA: Diagnosis not present

## 2019-10-20 DIAGNOSIS — E1140 Type 2 diabetes mellitus with diabetic neuropathy, unspecified: Secondary | ICD-10-CM | POA: Diagnosis not present

## 2019-10-20 DIAGNOSIS — I429 Cardiomyopathy, unspecified: Secondary | ICD-10-CM | POA: Diagnosis not present

## 2019-10-20 DIAGNOSIS — N1832 Chronic kidney disease, stage 3b: Secondary | ICD-10-CM | POA: Diagnosis not present

## 2019-10-20 DIAGNOSIS — R279 Unspecified lack of coordination: Secondary | ICD-10-CM | POA: Diagnosis not present

## 2019-10-20 DIAGNOSIS — G894 Chronic pain syndrome: Secondary | ICD-10-CM | POA: Diagnosis not present

## 2019-10-20 DIAGNOSIS — I130 Hypertensive heart and chronic kidney disease with heart failure and stage 1 through stage 4 chronic kidney disease, or unspecified chronic kidney disease: Secondary | ICD-10-CM | POA: Diagnosis not present

## 2019-10-20 DIAGNOSIS — N183 Chronic kidney disease, stage 3 unspecified: Secondary | ICD-10-CM | POA: Diagnosis not present

## 2019-10-20 DIAGNOSIS — S299XXA Unspecified injury of thorax, initial encounter: Secondary | ICD-10-CM | POA: Diagnosis not present

## 2019-10-20 DIAGNOSIS — K219 Gastro-esophageal reflux disease without esophagitis: Secondary | ICD-10-CM | POA: Diagnosis not present

## 2019-10-20 DIAGNOSIS — T887XXA Unspecified adverse effect of drug or medicament, initial encounter: Secondary | ICD-10-CM | POA: Diagnosis not present

## 2019-10-20 DIAGNOSIS — M109 Gout, unspecified: Secondary | ICD-10-CM | POA: Diagnosis not present

## 2019-10-20 DIAGNOSIS — E1122 Type 2 diabetes mellitus with diabetic chronic kidney disease: Secondary | ICD-10-CM | POA: Diagnosis not present

## 2019-10-20 DIAGNOSIS — I129 Hypertensive chronic kidney disease with stage 1 through stage 4 chronic kidney disease, or unspecified chronic kidney disease: Secondary | ICD-10-CM | POA: Diagnosis not present

## 2019-10-20 DIAGNOSIS — Z743 Need for continuous supervision: Secondary | ICD-10-CM | POA: Diagnosis not present

## 2019-10-20 DIAGNOSIS — G4730 Sleep apnea, unspecified: Secondary | ICD-10-CM | POA: Diagnosis not present

## 2019-10-20 DIAGNOSIS — R519 Headache, unspecified: Secondary | ICD-10-CM | POA: Diagnosis not present

## 2019-10-20 DIAGNOSIS — I5022 Chronic systolic (congestive) heart failure: Secondary | ICD-10-CM

## 2019-10-20 DIAGNOSIS — G629 Polyneuropathy, unspecified: Secondary | ICD-10-CM | POA: Diagnosis not present

## 2019-10-20 DIAGNOSIS — Z9181 History of falling: Secondary | ICD-10-CM | POA: Diagnosis not present

## 2019-10-20 DIAGNOSIS — F17210 Nicotine dependence, cigarettes, uncomplicated: Secondary | ICD-10-CM | POA: Diagnosis not present

## 2019-10-20 DIAGNOSIS — M47819 Spondylosis without myelopathy or radiculopathy, site unspecified: Secondary | ICD-10-CM | POA: Diagnosis not present

## 2019-10-20 DIAGNOSIS — S0990XA Unspecified injury of head, initial encounter: Secondary | ICD-10-CM | POA: Diagnosis not present

## 2019-10-20 DIAGNOSIS — M545 Low back pain: Secondary | ICD-10-CM | POA: Diagnosis not present

## 2019-10-20 DIAGNOSIS — M19072 Primary osteoarthritis, left ankle and foot: Secondary | ICD-10-CM | POA: Diagnosis not present

## 2019-10-20 DIAGNOSIS — G8929 Other chronic pain: Secondary | ICD-10-CM | POA: Diagnosis not present

## 2019-10-20 DIAGNOSIS — M19041 Primary osteoarthritis, right hand: Secondary | ICD-10-CM | POA: Diagnosis not present

## 2019-10-20 DIAGNOSIS — Z981 Arthrodesis status: Secondary | ICD-10-CM | POA: Diagnosis not present

## 2019-10-20 DIAGNOSIS — M19071 Primary osteoarthritis, right ankle and foot: Secondary | ICD-10-CM | POA: Diagnosis not present

## 2019-10-20 DIAGNOSIS — E7800 Pure hypercholesterolemia, unspecified: Secondary | ICD-10-CM | POA: Diagnosis not present

## 2019-10-20 LAB — CBC WITH DIFFERENTIAL/PLATELET
Abs Immature Granulocytes: 0.04 10*3/uL (ref 0.00–0.07)
Basophils Absolute: 0 10*3/uL (ref 0.0–0.1)
Basophils Relative: 0 %
Eosinophils Absolute: 0 10*3/uL (ref 0.0–0.5)
Eosinophils Relative: 0 %
HCT: 38.8 % (ref 36.0–46.0)
Hemoglobin: 11.3 g/dL — ABNORMAL LOW (ref 12.0–15.0)
Immature Granulocytes: 1 %
Lymphocytes Relative: 7 %
Lymphs Abs: 0.6 10*3/uL — ABNORMAL LOW (ref 0.7–4.0)
MCH: 28 pg (ref 26.0–34.0)
MCHC: 29.1 g/dL — ABNORMAL LOW (ref 30.0–36.0)
MCV: 96.3 fL (ref 80.0–100.0)
Monocytes Absolute: 0.4 10*3/uL (ref 0.1–1.0)
Monocytes Relative: 5 %
Neutro Abs: 6.6 10*3/uL (ref 1.7–7.7)
Neutrophils Relative %: 87 %
Platelets: 215 10*3/uL (ref 150–400)
RBC: 4.03 MIL/uL (ref 3.87–5.11)
RDW: 15.9 % — ABNORMAL HIGH (ref 11.5–15.5)
WBC: 7.6 10*3/uL (ref 4.0–10.5)
nRBC: 0 % (ref 0.0–0.2)

## 2019-10-20 LAB — COMPREHENSIVE METABOLIC PANEL
ALT: 16 U/L (ref 0–44)
AST: 31 U/L (ref 15–41)
Albumin: 3.1 g/dL — ABNORMAL LOW (ref 3.5–5.0)
Alkaline Phosphatase: 113 U/L (ref 38–126)
Anion gap: 10 (ref 5–15)
BUN: 54 mg/dL — ABNORMAL HIGH (ref 6–20)
CO2: 28 mmol/L (ref 22–32)
Calcium: 8.5 mg/dL — ABNORMAL LOW (ref 8.9–10.3)
Chloride: 100 mmol/L (ref 98–111)
Creatinine, Ser: 1.98 mg/dL — ABNORMAL HIGH (ref 0.44–1.00)
GFR calc Af Amer: 33 mL/min — ABNORMAL LOW (ref >60)
GFR calc non Af Amer: 28 mL/min — ABNORMAL LOW (ref >60)
Glucose, Bld: 258 mg/dL — ABNORMAL HIGH (ref 70–99)
Potassium: 4.8 mmol/L (ref 3.5–5.1)
Sodium: 138 mmol/L (ref 135–145)
Total Bilirubin: 1.1 mg/dL (ref 0.3–1.2)
Total Protein: 8.2 g/dL — ABNORMAL HIGH (ref 6.5–8.1)

## 2019-10-20 LAB — ACETAMINOPHEN LEVEL: Acetaminophen (Tylenol), Serum: <10 ug/mL — ABNORMAL LOW (ref 10–30)

## 2019-10-20 LAB — RAPID URINE DRUG SCREEN, HOSP PERFORMED
Amphetamines: NOT DETECTED
Barbiturates: NOT DETECTED
Benzodiazepines: NOT DETECTED
Cocaine: NOT DETECTED
Opiates: NOT DETECTED
Tetrahydrocannabinol: NOT DETECTED

## 2019-10-20 LAB — ETHANOL: Alcohol, Ethyl (B): <10 mg/dL (ref <10)

## 2019-10-20 LAB — I-STAT BETA HCG BLOOD, ED (MC, WL, AP ONLY): I-stat hCG, quantitative: <5 m[IU]/mL (ref <5)

## 2019-10-20 LAB — SALICYLATE LEVEL: Salicylate Lvl: <7 mg/dL — ABNORMAL LOW (ref 7.0–30.0)

## 2019-10-20 MED ORDER — NALOXONE HCL 2 MG/2ML IJ SOSY: 1.0000 mg | PREFILLED_SYRINGE | Freq: Once | INTRAMUSCULAR | Status: DC

## 2019-10-20 NOTE — ED Notes (Signed)
Straight cath performed using sterile technique by Raquel Sarna RN with positive urine output. Urine sent to lab.

## 2019-10-20 NOTE — ED Notes (Signed)
Pt came to the ED via EMS for a drug overdose. Pt conscious, breathing, and A&Ox4. Pt brought back to [facility] via stretcher. Pt endorses "I was sleeping and I have sleep apnea and they thought I was having a drug overdose". Chest rise and fall equally with non-labored breathing. Lungs clear apex to base. Abd soft and non-tender. Pt denies chest pain, n/v/d, shortness of breath, and f/c. PIVC placed on the right hand with a 20G which had positive blood return and flushed without pain or infiltration. Blood collected, labeled, and sent to lab. Bed in lowest position with call light within reach. Pt on continuous blood pressure, pulse ox, and cardiac monitor. Will continue to monitor. Awaiting MD eval. No distress noted.

## 2019-10-20 NOTE — ED Provider Notes (Signed)
[facility] EMERGENCY DEPARTMENT Provider Note   CSN: [id number] Arrival date & time: 10/19/19  2344     History Chief Complaint  Patient presents with  . Drug Overdose    [name] is a 54 y.o. female.  The history is provided by the patient.  Drug Overdose This is a new problem. The current episode started 1 to 2 hours ago. The problem occurs constantly. The problem has been rapidly improving. Pertinent negatives include no chest pain, no abdominal pain, no headaches and no shortness of breath. Nothing aggravates the symptoms. Nothing relieves the symptoms. Treatments tried: narcan. The treatment provided significant relief.  Accidental overdose with one percocet and one OXYIR ( the patient reported).  Patient became somnolent.  Reportedly had a fall out of the ambulance striking head and chest wall.       Past Medical History:  Diagnosis Date  . Arthritis    "hands, arms, feet, back, knees" (02/17/2018)  . Cardiomyopathy [facility])    From Dr. Bonney Roussel office notes from 01/13/18  . CHF (congestive heart failure) ([place])   . Chronic lower back pain   . Diabetic neuropathy ([place])   . Family history of adverse reaction to anesthesia    Mother is hard to arouse and blood pressure drops  . GERD (gastroesophageal reflux disease)   . High cholesterol   . Hypertension   . Sleep apnea    "need new machine"  - does not use a cpap (02/17/2018)  . Type II diabetes mellitus ([place])    no longer on medications (02/17/2018)    Patient Active Problem List   Diagnosis Date Noted  . Acute on chronic diastolic CHF (congestive heart failure) ([place]) 10/16/2019  . CKD (chronic kidney disease), stage III 10/16/2019  . Acute exacerbation of CHF (congestive heart failure) ([place]) 10/16/2019  . Acute on chronic systolic congestive heart failure ([place]) 09/11/2019  . AKI (acute kidney injury) ([place]) 09/11/2019  . Chronic pain syndrome 09/11/2019  . Heart failure ([place]) 09/11/2019  .  No-show for appointment 07/08/2019  . Chronic systolic heart failure ([place]) 04/26/2019  . S/P revision of total knee, right 05/14/2018  . Dislocated knee, right, sequela   . S/P revision of total knee 02/17/2018  . Failed total right knee replacement ([place])   . Chronic renal insufficiency, stage 2 (mild) 11/10/2017  . Acute CHF (congestive heart failure) ([place]) 11/10/2017  . Failed total knee arthroplasty, sequela 08/31/2017  . Lower extremity cellulitis 01/03/2013  . Diabetes mellitus ([place]) 01/03/2013  . Morbid obesity ([place]) 11/12/2006  . TOBACCO DEPENDENCE 11/12/2006  . Depression 11/12/2006  . Essential hypertension 11/12/2006  . GASTROESOPHAGEAL REFLUX, NO ESOPHAGITIS 11/12/2006  . ACNE 11/12/2006  . OSTEOARTHRITIS, LOWER LEG 11/12/2006  . OSA (obstructive sleep apnea) 11/12/2006    Past Surgical History:  Procedure Laterality Date  . ANTERIOR CERVICAL DECOMP/DISCECTOMY FUSION  03/06/2010   C4-7 w/corpectomy/notes 03/17/2010  . BACK SURGERY    . CARPAL TUNNEL RELEASE Right   . HARDWARE REVISION  04/10/2010   cervical hardware revision screw replacement C4/notes 04/12/2010  . JOINT REPLACEMENT    . LAPAROSCOPIC CHOLECYSTECTOMY  1999  . LUMBAR LAMINECTOMY/DECOMPRESSION MICRODISCECTOMY  09/2010   Archie Endo 10/05/2010  . MULTIPLE EXTRACTIONS WITH ALVEOLOPLASTY [name] 01/19/2015   Procedure: MULTIPLE EXTRACTIONS Three, Four, Five, Seven, eight, nine, ten, eleven, twelve, fourteen, eighteen, twenty-one, twenty-eight, twenty-nine, thirty-one with Alveoloplasty.  ;  Surgeon: Diona Browner, DDS;  Location: [facility];  Service: Oral Surgery;  Laterality: [name];  .  REPLACEMENT TOTAL KNEE BILATERAL Bilateral 10/2007-03/2008   "left-right"  . TOENAIL EXCISION Bilateral    great toes  . TONSILLECTOMY    . TOTAL KNEE REVISION Right 02/17/2018  . TOTAL KNEE REVISION Right 02/17/2018   Procedure: RIGHT TOTAL KNEE REVISION;  Surgeon: Newt Minion, MD;  Location: [facility];  Service: Orthopedics;  Laterality: Right;    . TOTAL KNEE REVISION Right 05/14/2018   Procedure: RIGHT TOTAL KNEE REVISION TO HINGED KNEE;  Surgeon: Newt Minion, MD;  Location: [facility];  Service: Orthopedics;  Laterality: Right;     OB History   No obstetric history on file.     Family History  Problem Relation Age of Onset  . Kidney disease Mother   . Congestive Heart Failure Mother   . Hypertension Father     Social History   Tobacco Use  . Smoking status: Current Every Day Smoker    Packs/day: 0.50    Years: 32.00    Pack years: 16.00    Types: Cigarettes  . Smokeless tobacco: Never Used  Substance Use Topics  . Alcohol use: Not Currently  . Drug use: Not Currently    Types: Marijuana    Home Medications Prior to Admission medications   Medication Sig Start Date End Date Taking? Authorizing Provider  albuterol (PROVENTIL) (2.5 MG/3ML) 0.083% nebulizer solution Take 2.5 mg by nebulization every 4 (four) hours as needed for wheezing.  08/22/19   [provider]  allopurinol (ZYLOPRIM) 300 MG tablet Take 300 mg by mouth daily.  02/03/19   [provider]  ALPRAZolam Duanne Moron) 0.25 MG tablet Take 0.25 mg by mouth 2 (two) times daily as needed for anxiety.  02/03/19   [provider]  buprenorphine (BUTRANS) 20 MCG/HR PTWK patch Place 1 patch onto the skin once a week. 08/23/19   [provider]  furosemide (LASIX) 40 MG tablet Take 1 tablet (40 mg total) by mouth 2 (two) times daily. 09/19/19 09/18/20  Geradine Girt, DO  gabapentin (NEURONTIN) 100 MG capsule Take 100 mg by mouth at bedtime as needed (pain).  08/22/19   [provider]  isosorbide-hydrALAZINE (BIDIL) 20-37.5 MG tablet Take 2 tablets by mouth 3 (three) times daily. 09/26/19   Miquel Dunn, NP  linagliptin (TRADJENTA) 5 MG TABS tablet Take 1 tablet (5 mg total) by mouth daily. 09/20/19   Geradine Girt, DO  metoprolol succinate (TOPROL-XL) 25 MG 24 hr tablet TAKE 1 TABLET(25 MG) BY MOUTH DAILY WITH OR  IMMEDIATELY FOLLOWING A MEAL Patient taking differently: Take 25 mg by mouth daily.  09/27/19   Miquel Dunn, NP  [facility] 4 MG/0.1ML LIQD nasal spray kit Place 0.4 mg into the nose as needed (for overdose).  12/28/18   [provider]  oxyCODONE-acetaminophen (PERCOCET) 10-325 MG tablet Take 1 tablet by mouth every 8 (eight) hours as needed for pain. Patient taking differently: Take 1 tablet by mouth in the morning, at noon, in the evening, and at bedtime.  09/30/18   Rayburn, Neta Mends, PA-C  Vitamin D, Ergocalciferol, (DRISDOL) 1.25 MG (50000 UT) CAPS capsule Take 50,000 Units by mouth every Tuesday.  07/26/19   [provider]  XTAMPZA ER 36 MG C12A Take 36 mg by mouth 2 (two) times daily.  08/25/19   [provider]    Allergies    Patient has no known allergies.  Review of Systems   Review of Systems  Constitutional: Negative for fever.  HENT: Negative for  congestion.   Eyes: Negative for visual disturbance.  Respiratory: Negative for shortness of breath.   Cardiovascular: Negative for chest pain.  Gastrointestinal: Negative for abdominal pain.  Genitourinary: Negative for difficulty urinating.  Musculoskeletal: Negative for arthralgias.  Neurological: Negative for headaches.  Psychiatric/Behavioral: Negative for suicidal ideas. The patient is not nervous/anxious.   All other systems reviewed and are negative.   Physical Exam Updated Vital Signs BP 123/75   Pulse 91   Temp 98.2 F (36.8 C) (Oral)   Resp 18   LMP 11/09/2012   SpO2 94%   Physical Exam Vitals and nursing note reviewed.  Constitutional:      General: [name] is not in acute distress.    Appearance: Normal appearance.  HENT:     Head: Normocephalic and atraumatic.     Nose: Nose normal.  Eyes:     Conjunctiva/sclera: Conjunctivae normal.     Pupils: Pupils are equal, round, and reactive to light.  Cardiovascular:     Rate and Rhythm: Normal rate and regular rhythm.      Pulses: Normal pulses.     Heart sounds: Normal heart sounds.  Pulmonary:     Effort: Pulmonary effort is normal.     Breath sounds: Normal breath sounds.  Abdominal:     General: Abdomen is flat. Bowel sounds are normal.     Tenderness: There is no abdominal tenderness. There is no guarding or rebound.  Musculoskeletal:        General: Normal range of motion.     Cervical back: Normal range of motion and neck supple.  Skin:    General: Skin is warm and dry.     Capillary Refill: Capillary refill takes less than 2 seconds.  Neurological:     General: No focal deficit present.     Mental Status: [name] is alert and oriented to person, place, and time.     Deep Tendon Reflexes: Reflexes normal.  Psychiatric:        Mood and Affect: Mood normal.        Behavior: Behavior normal.     ED Results / Procedures / Treatments   Labs (all labs ordered are listed, but only abnormal results are displayed) Results for orders placed or performed during the hospital encounter of 10/19/19  CBC WITH DIFFERENTIAL  Result Value Ref Range   WBC 7.6 4.0 - 10.5 K/uL   RBC 4.03 3.87 - 5.11 MIL/uL   Hemoglobin 11.3 (L) 12.0 - 15.0 g/dL   HCT 38.8 36.0 - 46.0 %   MCV 96.3 80.0 - 100.0 fL   MCH 28.0 26.0 - 34.0 pg   MCHC 29.1 (L) 30.0 - 36.0 g/dL   RDW 15.9 (H) 11.5 - 15.5 %   Platelets 215 150 - 400 K/uL   nRBC 0.0 0.0 - 0.2 %   Neutrophils Relative % 87 %   Neutro Abs 6.6 1.7 - 7.7 K/uL   Lymphocytes Relative 7 %   Lymphs Abs 0.6 (L) 0.7 - 4.0 K/uL   Monocytes Relative 5 %   Monocytes Absolute 0.4 0.1 - 1.0 K/uL   Eosinophils Relative 0 %   Eosinophils Absolute 0.0 0.0 - 0.5 K/uL   Basophils Relative 0 %   Basophils Absolute 0.0 0.0 - 0.1 K/uL   Immature Granulocytes 1 %   Abs Immature Granulocytes 0.04 0.00 - 0.07 K/uL  I-Stat beta hCG blood, ED  Result Value Ref Range   I-stat hCG, quantitative <5.0 <5 mIU/mL   Comment  3           DG Chest Portable 1 View  Result Date:  10/20/2019 CLINICAL DATA:  Fall and shortness of breath EXAM: PORTABLE CHEST 1 VIEW COMPARISON:  10/15/2019 FINDINGS: And mild cardiomegaly. Pulmonary vascular congestion without overt edema. No pleural effusion. IMPRESSION: Cardiomegaly and pulmonary vascular congestion without overt edema. Electronically Signed   By: Ulyses Jarred M.D.   On: 10/20/2019 00:53   DG Chest Port 1 View  Result Date: 10/15/2019 CLINICAL DATA:  Shortness of breath. EXAM: PORTABLE CHEST 1 VIEW COMPARISON:  September 10, 2019 FINDINGS: There is no evidence of focal consolidation, pleural effusion or pneumothorax. Prominence of the perihilar pulmonary vasculature is seen. The cardiac silhouette is markedly enlarged and unchanged in size. Radiopaque fusion plate and screws are seen along the lower cervical spine. The visualized skeletal structures are otherwise unremarkable. IMPRESSION: 1. Stable cardiomegaly with mild to moderate severity pulmonary vascular congestion. Electronically Signed   By: Virgina Norfolk M.D.   On: 10/15/2019 22:48     EKG None  Radiology DG Chest Portable 1 View  Result Date: 10/20/2019 CLINICAL DATA:  Fall and shortness of breath EXAM: PORTABLE CHEST 1 VIEW COMPARISON:  10/15/2019 FINDINGS: And mild cardiomegaly. Pulmonary vascular congestion without overt edema. No pleural effusion. IMPRESSION: Cardiomegaly and pulmonary vascular congestion without overt edema. Electronically Signed   By: Ulyses Jarred M.D.   On: 10/20/2019 00:53    Procedures Procedures (including critical care time)  Medications Ordered in ED Medications  naloxone (NARCAN) injection 1 mg (has no administration in time range)    ED Course  I have reviewed the triage vital signs and the nursing notes.  Pertinent labs & imaging results that were available during my care of the patient were reviewed by me and considered in my medical decision making (see chart for details).  No head or neck trauma.     Monitored in the  ER.  No further somnolence.  No SI or HI.  No AH or VH.  PO challenged.  Stop some of the narcotics.    [name] was evaluated in Emergency Department on 10/20/2019 for the symptoms described in the history of present illness. [name] was evaluated in the context of the global COVID-19 pandemic, which necessitated consideration that the patient might be at risk for infection with the SARS-CoV-2 virus that causes COVID-19. Institutional protocols and algorithms that pertain to the evaluation of patients at risk for COVID-19 are in a state of rapid change based on information released by regulatory bodies including the CDC and federal and state organizations. These policies and algorithms were followed during the patient's care in the ED.   Final Clinical Impression(s) / ED Diagnoses Final diagnoses:  Polypharmacy  Opiate overdose, accidental or unintentional, initial encounter ([place])     Return for weakness, numbness, changes in vision or speech, fevers >100.4 unrelieved by medication, shortness of breath, intractable vomiting, or diarrhea, abdominal pain, Inability to tolerate liquids or food, cough, altered mental status or any concerns. No signs of systemic illness or infection. The patient is nontoxic-appearing on exam and vital signs are within normal limits.   I have reviewed the triage vital signs and the nursing notes. Pertinent labs &imaging results that were available during my care of the patient were reviewed by me and considered in my medical decision making (see chart for details).  After history, exam, and medical workup I feel the patient has been appropriately medically screened and is safe  for discharge home. Pertinent diagnoses were discussed with the patient. Patient was given return precautions   Roniyah Llorens, MD 10/20/19 [id number]

## 2019-10-20 NOTE — ED Notes (Signed)
[facility] called. Pt made aware.

## 2019-10-20 NOTE — ED Notes (Signed)
Pt was discharged from the ED. Pt read and understood discharge paperwork. Pt had vital signs completed. Pt conscious, breathing, and A&Ox4. No distress noted. Pt speaking in complete sentences. Pt transported out of the ED by [facility] on a stretcher.

## 2019-10-20 NOTE — ED Notes (Signed)
Pt resting in bed. Pt denies new or worsening complaints. Will continue to monitor. No distress noted. Pt on continuous monitoring via blood pressure, pulse ox, and cardiac monitor.  

## 2019-10-20 NOTE — ED Notes (Signed)
CMP redrawn and sent to lab. 

## 2019-10-20 NOTE — Progress Notes (Signed)
Follow up visit  Subjective:   Ena Dawley, female    DOB: [birth year], 54 y.o.   MRN: [id number]   This visit type was conducted due to national recommendations for restrictions regarding the COVID-19 Pandemic (e.g. social distancing).  This format is felt to be most appropriate for this patient at this time.  All issues noted in this document were discussed and addressed.  No physical exam was performed (except for noted visual exam findings with Telehealth visits).  The patient has consented to conduct a Telehealth visit and understands insurance will be billed.   I discussed the limitations of evaluation and management by telemedicine and the availability of in person appointments. The patient expressed understanding and agreed to proceed.  Virtual Visit via Video Note is as below  I connected with@, on 10/20/19 at 1000 by telephone and verified that I am speaking with the correct person using two identifiers. Unable to perform video visit as patient did not have equipment.    I have discussed with the patient regarding the safety during COVID Pandemic and steps and precautions including social distancing with the patient.    Chief complaint: Cardiomyopathy   Congestive Heart Failure Pertinent negatives include no abdominal pain, chest pain or palpitations.    54 year old African-American female with hypertension, type II diabetes mellitus, tobacco abuse, morbid obesity BMI 67, heart failure with reduced ejection fraction (likely nonischemic cardiomyopathy), negative nuclear stress test 01/2018.  Admitted with heart failure exacerbation on 09/10/19 and again recently on 10/16/19. She was aggressively diuresed and discharged home. BNP was 562.  She feels that things are getting better. Swelling is improving. She is making diet changes and following fluid restrictions. Tolerating medications well.   She is currently [facility], CNA care.    Past Medical History:    Diagnosis Date  . Arthritis    "hands, arms, feet, back, knees" (02/17/2018)  . Cardiomyopathy [facility])    From Dr. Bonney Roussel office notes from 01/13/18  . CHF (congestive heart failure) ([place])   . Chronic lower back pain   . Diabetic neuropathy ([place])   . Family history of adverse reaction to anesthesia    Mother is hard to arouse and blood pressure drops  . GERD (gastroesophageal reflux disease)   . High cholesterol   . Hypertension   . Sleep apnea    "need new machine"  - does not use a cpap (02/17/2018)  . Type II diabetes mellitus ([place])    no longer on medications (02/17/2018)     Family History  Problem Relation Age of Onset  . Kidney disease Mother   . Congestive Heart Failure Mother   . Hypertension Father      Current Outpatient Medications on File Prior to Visit  Medication Sig Dispense Refill  . albuterol (PROVENTIL) (2.5 MG/3ML) 0.083% nebulizer solution Take 2.5 mg by nebulization every 4 (four) hours as needed for wheezing.     . buprenorphine (BUTRANS) 20 MCG/HR PTWK patch Place 1 patch onto the skin every Saturday.     . furosemide (LASIX) 40 MG tablet Take 1 tablet (40 mg total) by mouth 2 (two) times daily. 60 tablet 0  . gabapentin (NEURONTIN) 100 MG capsule Take 100 mg by mouth at bedtime as needed (pain).     . isosorbide-hydrALAZINE (BIDIL) 20-37.5 MG tablet Take 2 tablets by mouth 3 (three) times daily. 90 tablet 2  . linagliptin (TRADJENTA) 5 MG TABS tablet Take 1 tablet (5 mg total) by mouth  daily. 30 tablet 0  . oxyCODONE-acetaminophen (PERCOCET) 10-325 MG tablet Take 1 tablet by mouth every 8 (eight) hours as needed for pain. (Patient taking differently: Take 1 tablet by mouth in the morning, at noon, in the evening, and at bedtime. ) 28 tablet 0  . Vitamin D, Ergocalciferol, (DRISDOL) 1.25 MG (50000 UT) CAPS capsule Take 50,000 Units by mouth every Tuesday.     Marland Kitchen XTAMPZA ER 36 MG C12A Take 36 mg by mouth 2 (two) times daily.     Marland Kitchen allopurinol (ZYLOPRIM) 300  MG tablet Take 300 mg by mouth daily.     Marland Kitchen ALPRAZolam (XANAX) 0.25 MG tablet Take 0.25 mg by mouth 2 (two) times daily as needed for anxiety.     . metoprolol succinate (TOPROL-XL) 25 MG 24 hr tablet TAKE 1 TABLET(25 MG) BY MOUTH DAILY WITH OR IMMEDIATELY FOLLOWING A MEAL 90 tablet 3  . NARCAN 4 MG/0.1ML LIQD nasal spray kit Place 0.4 mg into the nose as needed (for overdose).      No current facility-administered medications on file prior to visit.    Cardiovascular studies:  Echo 09/12/2019:  1. Left ventricular ejection fraction, by visual estimation, is 35 to 40%. The left ventricle has moderate to severely decreased function. There is mildly increased left ventricular hypertrophy.  2. Definity contrast agent was given IV to delineate the left ventricular endocardial borders.  3. Left ventricular diastolic parameters are consistent with Grade I diastolic dysfunction (impaired relaxation).  4. The left ventricle demonstrates global hypokinesis.  5. Global right ventricle was not well visualized.The right ventricular size is not well visualized. Right vetricular wall thickness was not assessed.  6. Left atrial size was not well visualized.  7. Right atrial size was not well visualized.  8. The mitral valve is grossly normal. Trivial mitral valve regurgitation.  9. The tricuspid valve is not well visualized. 10. The aortic valve was not well visualized. Aortic valve regurgitation is not visualized. 11. The pulmonic valve was not well visualized. Pulmonic valve regurgitation is not visualized. 12. The aortic root was not well visualized. 13. Moderately elevated pulmonary artery systolic pressure. 14. The inferior vena cava is normal in size with <50% respiratory variability, suggesting right atrial pressure of 8 mmHg. 15. The interatrial septum was not well visualized.  Lexiscan nucler stress test 01/08/2018 [facility]):" 1. No reversible ischemia or infarction.  2. Global hypokinesis  with severe left ventricular dilatation.  3. Left ventricular ejection fraction 31%  4. High-risk stress test findings*.  Recent labs: 09/18/18: Glucose 173, potassium 5.4, Creatinine 1.98, eGFR 29/33, BMP otherwise normal.    Review of Systems  Constitution: Negative for decreased appetite, malaise/fatigue, weight gain and weight loss.  HENT: Negative for congestion.   Eyes: Negative for visual disturbance.  Cardiovascular: Positive for dyspnea on exertion (improved) and leg swelling (improved). Negative for chest pain, palpitations and syncope.  Respiratory: Negative for cough.   Endocrine: Negative for cold intolerance.  Hematologic/Lymphatic: Does not bruise/bleed easily.  Skin: Negative for itching and rash.  Musculoskeletal: Positive for joint pain. Negative for myalgias.  Gastrointestinal: Negative for abdominal pain, nausea and vomiting.  Genitourinary: Negative for dysuria.  Neurological: Negative for dizziness and weakness.  Psychiatric/Behavioral: The patient is not nervous/anxious.   All other systems reviewed and are negative.       Today's Vitals   10/20/19 1029  BP: (!) 159/43  Pulse: 95   There is no height or weight on file to calculate BMI.  Objective:   Physical Exam  Constitutional: She is oriented to person, place, and time. She appears well-developed and well-nourished. No distress.  Morbid obesity  HENT:  Head: Normocephalic and atraumatic.  Eyes: Pupils are equal, round, and reactive to light. Conjunctivae are normal.  Neck: No JVD present.  Cardiovascular: Normal rate and regular rhythm. Exam reveals decreased pulses.  Pulmonary/Chest: Effort normal and breath sounds normal. She has no wheezes. She has no rales.  Abdominal: Soft. Bowel sounds are normal. There is no rebound.  Musculoskeletal:        General: Edema (2+ edema) present.  Lymphadenopathy:    She has no cervical adenopathy.  Neurological: She is alert and oriented to person,  place, and time. No cranial nerve deficit.  Skin: Skin is warm and dry.  Psychiatric: She has a normal mood and affect.  Nursing note and vitals reviewed.      Assessment & Recommendations:   54 year old African-American female with hypertension, type II diabetes mellitus, tobacco abuse, morbid obesity BMI 67, heart failure with reduced ejection fraction (likely nonischemic cardiomyopathy), negative nuclear stress test 01/2018. Virtual visit for hospital follow up.  HFrEF: Feeling much better. Feels like she is getting better. NYHA class II-III symptoms. Continue to hold Entresto for now in view of CKD. Tolerating medications. Continue with Metoprolol succinate, Bidil, and lasix.  She is making significant dietary changes, in which I have congratulated her on and urged her to continue with this. Stressed the importance of this in controlling her heart failure. She will benefit from heart failure home health program.   HTN: Blood pressure continues to be uncontrolled. Systolic readings in the 825-053 range. Will add amldoipine 5 mg daily for better BP control.   AKI on CKD: Kidney function continues to be worsening. Cannot resume Entresto. Will need evaluation by Nephrology.  Morbid obesity: Continue to work on this with dietary changes.    Will follow up in 3 weeks for close monitoring, hopefully in office. Patient reports that she will be compliant with medications and her upcoming appointments.     Miquel Dunn, MSN, APRN, FNP-C [facility]. [place] Office: [phone] Fax: [phone]

## 2019-10-23 DIAGNOSIS — J42 Unspecified chronic bronchitis: Secondary | ICD-10-CM | POA: Diagnosis not present

## 2019-10-24 ENCOUNTER — Telehealth: Payer: Self-pay

## 2019-10-24 MED ORDER — AMLODIPINE BESYLATE 5 MG PO TABS: 5.0000 mg | ORAL_TABLET | Freq: Every day | ORAL | 2 refills | Status: DC

## 2019-10-24 NOTE — Telephone Encounter (Signed)
I would recommend that we add one BP medication to her current regimen, amlodipine 5 mg daily.

## 2019-10-24 NOTE — Telephone Encounter (Signed)
Called pt to get bp readings from last week.  Tues: 180/70 pt went to ED this day, states EMS dropped her bed while she was in it Mobile: 163/40 Fri: 163/60 Sat: 150/63 She left a vm for the aide that comes out to take her bp so she can get Mon and Wed readings.

## 2019-10-25 DIAGNOSIS — K219 Gastro-esophageal reflux disease without esophagitis: Secondary | ICD-10-CM | POA: Diagnosis not present

## 2019-10-25 DIAGNOSIS — M47819 Spondylosis without myelopathy or radiculopathy, site unspecified: Secondary | ICD-10-CM | POA: Diagnosis not present

## 2019-10-25 DIAGNOSIS — G894 Chronic pain syndrome: Secondary | ICD-10-CM | POA: Diagnosis not present

## 2019-10-25 DIAGNOSIS — M19042 Primary osteoarthritis, left hand: Secondary | ICD-10-CM | POA: Diagnosis not present

## 2019-10-25 DIAGNOSIS — G4730 Sleep apnea, unspecified: Secondary | ICD-10-CM | POA: Diagnosis not present

## 2019-10-25 DIAGNOSIS — I429 Cardiomyopathy, unspecified: Secondary | ICD-10-CM | POA: Diagnosis not present

## 2019-10-25 DIAGNOSIS — I5023 Acute on chronic systolic (congestive) heart failure: Secondary | ICD-10-CM | POA: Diagnosis not present

## 2019-10-25 DIAGNOSIS — M19071 Primary osteoarthritis, right ankle and foot: Secondary | ICD-10-CM | POA: Diagnosis not present

## 2019-10-25 DIAGNOSIS — Z9889 Other specified postprocedural states: Secondary | ICD-10-CM | POA: Diagnosis not present

## 2019-10-25 DIAGNOSIS — N183 Chronic kidney disease, stage 3 unspecified: Secondary | ICD-10-CM | POA: Diagnosis not present

## 2019-10-25 DIAGNOSIS — Z9181 History of falling: Secondary | ICD-10-CM | POA: Diagnosis not present

## 2019-10-25 DIAGNOSIS — E7800 Pure hypercholesterolemia, unspecified: Secondary | ICD-10-CM | POA: Diagnosis not present

## 2019-10-25 DIAGNOSIS — E1140 Type 2 diabetes mellitus with diabetic neuropathy, unspecified: Secondary | ICD-10-CM | POA: Diagnosis not present

## 2019-10-25 DIAGNOSIS — M109 Gout, unspecified: Secondary | ICD-10-CM | POA: Diagnosis not present

## 2019-10-25 DIAGNOSIS — M19072 Primary osteoarthritis, left ankle and foot: Secondary | ICD-10-CM | POA: Diagnosis not present

## 2019-10-25 DIAGNOSIS — E1122 Type 2 diabetes mellitus with diabetic chronic kidney disease: Secondary | ICD-10-CM | POA: Diagnosis not present

## 2019-10-25 DIAGNOSIS — Z981 Arthrodesis status: Secondary | ICD-10-CM | POA: Diagnosis not present

## 2019-10-25 DIAGNOSIS — Z96653 Presence of artificial knee joint, bilateral: Secondary | ICD-10-CM | POA: Diagnosis not present

## 2019-10-25 DIAGNOSIS — M19041 Primary osteoarthritis, right hand: Secondary | ICD-10-CM | POA: Diagnosis not present

## 2019-10-25 DIAGNOSIS — I130 Hypertensive heart and chronic kidney disease with heart failure and stage 1 through stage 4 chronic kidney disease, or unspecified chronic kidney disease: Secondary | ICD-10-CM | POA: Diagnosis not present

## 2019-10-26 NOTE — Telephone Encounter (Signed)
Pt aware of instructions and rx sent to pharmacy.//ah

## 2019-10-28 DIAGNOSIS — M19042 Primary osteoarthritis, left hand: Secondary | ICD-10-CM | POA: Diagnosis not present

## 2019-10-28 DIAGNOSIS — Z981 Arthrodesis status: Secondary | ICD-10-CM | POA: Diagnosis not present

## 2019-10-28 DIAGNOSIS — E1122 Type 2 diabetes mellitus with diabetic chronic kidney disease: Secondary | ICD-10-CM | POA: Diagnosis not present

## 2019-10-28 DIAGNOSIS — Z9181 History of falling: Secondary | ICD-10-CM | POA: Diagnosis not present

## 2019-10-28 DIAGNOSIS — G894 Chronic pain syndrome: Secondary | ICD-10-CM | POA: Diagnosis not present

## 2019-10-28 DIAGNOSIS — I130 Hypertensive heart and chronic kidney disease with heart failure and stage 1 through stage 4 chronic kidney disease, or unspecified chronic kidney disease: Secondary | ICD-10-CM | POA: Diagnosis not present

## 2019-10-28 DIAGNOSIS — M19071 Primary osteoarthritis, right ankle and foot: Secondary | ICD-10-CM | POA: Diagnosis not present

## 2019-10-28 DIAGNOSIS — Z9889 Other specified postprocedural states: Secondary | ICD-10-CM | POA: Diagnosis not present

## 2019-10-28 DIAGNOSIS — M19041 Primary osteoarthritis, right hand: Secondary | ICD-10-CM | POA: Diagnosis not present

## 2019-10-28 DIAGNOSIS — N183 Chronic kidney disease, stage 3 unspecified: Secondary | ICD-10-CM | POA: Diagnosis not present

## 2019-10-28 DIAGNOSIS — M19072 Primary osteoarthritis, left ankle and foot: Secondary | ICD-10-CM | POA: Diagnosis not present

## 2019-10-28 DIAGNOSIS — M109 Gout, unspecified: Secondary | ICD-10-CM | POA: Diagnosis not present

## 2019-10-28 DIAGNOSIS — E7800 Pure hypercholesterolemia, unspecified: Secondary | ICD-10-CM | POA: Diagnosis not present

## 2019-10-28 DIAGNOSIS — I5023 Acute on chronic systolic (congestive) heart failure: Secondary | ICD-10-CM | POA: Diagnosis not present

## 2019-10-28 DIAGNOSIS — Z96653 Presence of artificial knee joint, bilateral: Secondary | ICD-10-CM | POA: Diagnosis not present

## 2019-10-28 DIAGNOSIS — G4730 Sleep apnea, unspecified: Secondary | ICD-10-CM | POA: Diagnosis not present

## 2019-10-28 DIAGNOSIS — M47819 Spondylosis without myelopathy or radiculopathy, site unspecified: Secondary | ICD-10-CM | POA: Diagnosis not present

## 2019-10-28 DIAGNOSIS — I429 Cardiomyopathy, unspecified: Secondary | ICD-10-CM | POA: Diagnosis not present

## 2019-10-28 DIAGNOSIS — K219 Gastro-esophageal reflux disease without esophagitis: Secondary | ICD-10-CM | POA: Diagnosis not present

## 2019-10-28 DIAGNOSIS — E1140 Type 2 diabetes mellitus with diabetic neuropathy, unspecified: Secondary | ICD-10-CM | POA: Diagnosis not present

## 2019-11-02 DIAGNOSIS — Z96653 Presence of artificial knee joint, bilateral: Secondary | ICD-10-CM | POA: Diagnosis not present

## 2019-11-02 DIAGNOSIS — E1140 Type 2 diabetes mellitus with diabetic neuropathy, unspecified: Secondary | ICD-10-CM | POA: Diagnosis not present

## 2019-11-02 DIAGNOSIS — M19071 Primary osteoarthritis, right ankle and foot: Secondary | ICD-10-CM | POA: Diagnosis not present

## 2019-11-02 DIAGNOSIS — I429 Cardiomyopathy, unspecified: Secondary | ICD-10-CM | POA: Diagnosis not present

## 2019-11-02 DIAGNOSIS — I130 Hypertensive heart and chronic kidney disease with heart failure and stage 1 through stage 4 chronic kidney disease, or unspecified chronic kidney disease: Secondary | ICD-10-CM | POA: Diagnosis not present

## 2019-11-02 DIAGNOSIS — M19042 Primary osteoarthritis, left hand: Secondary | ICD-10-CM | POA: Diagnosis not present

## 2019-11-02 DIAGNOSIS — N183 Chronic kidney disease, stage 3 unspecified: Secondary | ICD-10-CM | POA: Diagnosis not present

## 2019-11-02 DIAGNOSIS — E1122 Type 2 diabetes mellitus with diabetic chronic kidney disease: Secondary | ICD-10-CM | POA: Diagnosis not present

## 2019-11-02 DIAGNOSIS — M109 Gout, unspecified: Secondary | ICD-10-CM | POA: Diagnosis not present

## 2019-11-02 DIAGNOSIS — Z9181 History of falling: Secondary | ICD-10-CM | POA: Diagnosis not present

## 2019-11-02 DIAGNOSIS — M19072 Primary osteoarthritis, left ankle and foot: Secondary | ICD-10-CM | POA: Diagnosis not present

## 2019-11-02 DIAGNOSIS — Z981 Arthrodesis status: Secondary | ICD-10-CM | POA: Diagnosis not present

## 2019-11-02 DIAGNOSIS — G894 Chronic pain syndrome: Secondary | ICD-10-CM | POA: Diagnosis not present

## 2019-11-02 DIAGNOSIS — M47819 Spondylosis without myelopathy or radiculopathy, site unspecified: Secondary | ICD-10-CM | POA: Diagnosis not present

## 2019-11-02 DIAGNOSIS — I5023 Acute on chronic systolic (congestive) heart failure: Secondary | ICD-10-CM | POA: Diagnosis not present

## 2019-11-02 DIAGNOSIS — M19041 Primary osteoarthritis, right hand: Secondary | ICD-10-CM | POA: Diagnosis not present

## 2019-11-02 DIAGNOSIS — G4730 Sleep apnea, unspecified: Secondary | ICD-10-CM | POA: Diagnosis not present

## 2019-11-02 DIAGNOSIS — E7800 Pure hypercholesterolemia, unspecified: Secondary | ICD-10-CM | POA: Diagnosis not present

## 2019-11-02 DIAGNOSIS — Z9889 Other specified postprocedural states: Secondary | ICD-10-CM | POA: Diagnosis not present

## 2019-11-02 DIAGNOSIS — K219 Gastro-esophageal reflux disease without esophagitis: Secondary | ICD-10-CM | POA: Diagnosis not present

## 2019-11-07 ENCOUNTER — Inpatient Hospital Stay (HOSPITAL_COMMUNITY)
Admission: EM | Admit: 2019-11-07 | Discharge: 2019-11-21 | DRG: 291 | Disposition: A | Discharge status: Hospice | Payer: Medicare Other | Attending: Internal Medicine | Admitting: Internal Medicine

## 2019-11-07 ENCOUNTER — Emergency Department (HOSPITAL_COMMUNITY): Payer: Medicare Other

## 2019-11-07 DIAGNOSIS — Z66 Do not resuscitate: Secondary | ICD-10-CM | POA: Diagnosis not present

## 2019-11-07 DIAGNOSIS — Z7189 Other specified counseling: Secondary | ICD-10-CM

## 2019-11-07 DIAGNOSIS — Z515 Encounter for palliative care: Secondary | ICD-10-CM

## 2019-11-07 DIAGNOSIS — F17210 Nicotine dependence, cigarettes, uncomplicated: Secondary | ICD-10-CM | POA: Diagnosis present

## 2019-11-07 DIAGNOSIS — J9601 Acute respiratory failure with hypoxia: Secondary | ICD-10-CM | POA: Diagnosis not present

## 2019-11-07 DIAGNOSIS — Z20822 Contact with and (suspected) exposure to covid-19: Secondary | ICD-10-CM | POA: Diagnosis present

## 2019-11-07 DIAGNOSIS — M5136 Other intervertebral disc degeneration, lumbar region: Secondary | ICD-10-CM | POA: Diagnosis present

## 2019-11-07 DIAGNOSIS — M4802 Spinal stenosis, cervical region: Secondary | ICD-10-CM | POA: Diagnosis not present

## 2019-11-07 DIAGNOSIS — R52 Pain, unspecified: Secondary | ICD-10-CM

## 2019-11-07 DIAGNOSIS — I248 Other forms of acute ischemic heart disease: Secondary | ICD-10-CM | POA: Diagnosis present

## 2019-11-07 DIAGNOSIS — N183 Chronic kidney disease, stage 3 unspecified: Secondary | ICD-10-CM | POA: Diagnosis present

## 2019-11-07 DIAGNOSIS — L89322 Pressure ulcer of left buttock, stage 2: Secondary | ICD-10-CM | POA: Diagnosis present

## 2019-11-07 DIAGNOSIS — E875 Hyperkalemia: Secondary | ICD-10-CM | POA: Diagnosis not present

## 2019-11-07 DIAGNOSIS — I272 Pulmonary hypertension, unspecified: Secondary | ICD-10-CM | POA: Diagnosis present

## 2019-11-07 DIAGNOSIS — I130 Hypertensive heart and chronic kidney disease with heart failure and stage 1 through stage 4 chronic kidney disease, or unspecified chronic kidney disease: Secondary | ICD-10-CM | POA: Diagnosis not present

## 2019-11-07 DIAGNOSIS — D509 Iron deficiency anemia, unspecified: Secondary | ICD-10-CM | POA: Diagnosis not present

## 2019-11-07 DIAGNOSIS — E1140 Type 2 diabetes mellitus with diabetic neuropathy, unspecified: Secondary | ICD-10-CM | POA: Diagnosis not present

## 2019-11-07 DIAGNOSIS — I890 Lymphedema, not elsewhere classified: Secondary | ICD-10-CM | POA: Diagnosis present

## 2019-11-07 DIAGNOSIS — E1122 Type 2 diabetes mellitus with diabetic chronic kidney disease: Secondary | ICD-10-CM | POA: Diagnosis not present

## 2019-11-07 DIAGNOSIS — D631 Anemia in chronic kidney disease: Secondary | ICD-10-CM | POA: Diagnosis not present

## 2019-11-07 DIAGNOSIS — E119 Type 2 diabetes mellitus without complications: Secondary | ICD-10-CM

## 2019-11-07 DIAGNOSIS — R140 Abdominal distension (gaseous): Secondary | ICD-10-CM | POA: Diagnosis not present

## 2019-11-07 DIAGNOSIS — S199XXA Unspecified injury of neck, initial encounter: Secondary | ICD-10-CM | POA: Diagnosis not present

## 2019-11-07 DIAGNOSIS — N390 Urinary tract infection, site not specified: Secondary | ICD-10-CM | POA: Diagnosis present

## 2019-11-07 DIAGNOSIS — Z789 Other specified health status: Secondary | ICD-10-CM

## 2019-11-07 DIAGNOSIS — R079 Chest pain, unspecified: Secondary | ICD-10-CM | POA: Diagnosis not present

## 2019-11-07 DIAGNOSIS — Z841 Family history of disorders of kidney and ureter: Secondary | ICD-10-CM

## 2019-11-07 DIAGNOSIS — J42 Unspecified chronic bronchitis: Secondary | ICD-10-CM | POA: Diagnosis not present

## 2019-11-07 DIAGNOSIS — G9341 Metabolic encephalopathy: Secondary | ICD-10-CM | POA: Diagnosis present

## 2019-11-07 DIAGNOSIS — N184 Chronic kidney disease, stage 4 (severe): Secondary | ICD-10-CM | POA: Diagnosis not present

## 2019-11-07 DIAGNOSIS — I5043 Acute on chronic combined systolic (congestive) and diastolic (congestive) heart failure: Secondary | ICD-10-CM | POA: Diagnosis present

## 2019-11-07 DIAGNOSIS — M2550 Pain in unspecified joint: Secondary | ICD-10-CM | POA: Diagnosis not present

## 2019-11-07 DIAGNOSIS — I10 Essential (primary) hypertension: Secondary | ICD-10-CM | POA: Diagnosis present

## 2019-11-07 DIAGNOSIS — I428 Other cardiomyopathies: Secondary | ICD-10-CM | POA: Diagnosis present

## 2019-11-07 DIAGNOSIS — S3992XA Unspecified injury of lower back, initial encounter: Secondary | ICD-10-CM | POA: Diagnosis not present

## 2019-11-07 DIAGNOSIS — Z79899 Other long term (current) drug therapy: Secondary | ICD-10-CM

## 2019-11-07 DIAGNOSIS — R5381 Other malaise: Secondary | ICD-10-CM | POA: Diagnosis not present

## 2019-11-07 DIAGNOSIS — R34 Anuria and oliguria: Secondary | ICD-10-CM | POA: Diagnosis not present

## 2019-11-07 DIAGNOSIS — R0902 Hypoxemia: Secondary | ICD-10-CM | POA: Diagnosis not present

## 2019-11-07 DIAGNOSIS — Z981 Arthrodesis status: Secondary | ICD-10-CM

## 2019-11-07 DIAGNOSIS — E1165 Type 2 diabetes mellitus with hyperglycemia: Secondary | ICD-10-CM | POA: Diagnosis not present

## 2019-11-07 DIAGNOSIS — I959 Hypotension, unspecified: Secondary | ICD-10-CM | POA: Diagnosis present

## 2019-11-07 DIAGNOSIS — Z743 Need for continuous supervision: Secondary | ICD-10-CM | POA: Diagnosis not present

## 2019-11-07 DIAGNOSIS — G894 Chronic pain syndrome: Secondary | ICD-10-CM | POA: Diagnosis present

## 2019-11-07 DIAGNOSIS — K219 Gastro-esophageal reflux disease without esophagitis: Secondary | ICD-10-CM | POA: Diagnosis not present

## 2019-11-07 DIAGNOSIS — R1310 Dysphagia, unspecified: Secondary | ICD-10-CM | POA: Diagnosis present

## 2019-11-07 DIAGNOSIS — N182 Chronic kidney disease, stage 2 (mild): Secondary | ICD-10-CM | POA: Diagnosis not present

## 2019-11-07 DIAGNOSIS — Z6841 Body Mass Index (BMI) 40.0 and over, adult: Secondary | ICD-10-CM | POA: Diagnosis not present

## 2019-11-07 DIAGNOSIS — Z03818 Encounter for observation for suspected exposure to other biological agents ruled out: Secondary | ICD-10-CM | POA: Diagnosis not present

## 2019-11-07 DIAGNOSIS — I5023 Acute on chronic systolic (congestive) heart failure: Secondary | ICD-10-CM | POA: Diagnosis present

## 2019-11-07 DIAGNOSIS — S3991XA Unspecified injury of abdomen, initial encounter: Secondary | ICD-10-CM | POA: Diagnosis not present

## 2019-11-07 DIAGNOSIS — G253 Myoclonus: Secondary | ICD-10-CM | POA: Diagnosis present

## 2019-11-07 DIAGNOSIS — E1129 Type 2 diabetes mellitus with other diabetic kidney complication: Secondary | ICD-10-CM | POA: Diagnosis not present

## 2019-11-07 DIAGNOSIS — Z7401 Bed confinement status: Secondary | ICD-10-CM

## 2019-11-07 DIAGNOSIS — N1832 Chronic kidney disease, stage 3b: Secondary | ICD-10-CM | POA: Diagnosis not present

## 2019-11-07 DIAGNOSIS — R0602 Shortness of breath: Secondary | ICD-10-CM | POA: Diagnosis not present

## 2019-11-07 DIAGNOSIS — Z96653 Presence of artificial knee joint, bilateral: Secondary | ICD-10-CM | POA: Diagnosis present

## 2019-11-07 DIAGNOSIS — Z9049 Acquired absence of other specified parts of digestive tract: Secondary | ICD-10-CM

## 2019-11-07 DIAGNOSIS — N179 Acute kidney failure, unspecified: Secondary | ICD-10-CM | POA: Diagnosis present

## 2019-11-07 DIAGNOSIS — Z8249 Family history of ischemic heart disease and other diseases of the circulatory system: Secondary | ICD-10-CM

## 2019-11-07 DIAGNOSIS — G4733 Obstructive sleep apnea (adult) (pediatric): Secondary | ICD-10-CM | POA: Diagnosis present

## 2019-11-07 DIAGNOSIS — M5489 Other dorsalgia: Secondary | ICD-10-CM | POA: Diagnosis not present

## 2019-11-07 LAB — CBC WITH DIFFERENTIAL/PLATELET
Abs Immature Granulocytes: 0.03 10*3/uL (ref 0.00–0.07)
Basophils Absolute: 0 10*3/uL (ref 0.0–0.1)
Basophils Relative: 1 %
Eosinophils Absolute: 0 10*3/uL (ref 0.0–0.5)
Eosinophils Relative: 1 %
HCT: 35.6 % — ABNORMAL LOW (ref 36.0–46.0)
Hemoglobin: 10.3 g/dL — ABNORMAL LOW (ref 12.0–15.0)
Immature Granulocytes: 1 %
Lymphocytes Relative: 10 %
Lymphs Abs: 0.6 10*3/uL — ABNORMAL LOW (ref 0.7–4.0)
MCH: 27.6 pg (ref 26.0–34.0)
MCHC: 28.9 g/dL — ABNORMAL LOW (ref 30.0–36.0)
MCV: 95.4 fL (ref 80.0–100.0)
Monocytes Absolute: 0.5 10*3/uL (ref 0.1–1.0)
Monocytes Relative: 9 %
Neutro Abs: 4.6 10*3/uL (ref 1.7–7.7)
Neutrophils Relative %: 78 %
Platelets: 214 10*3/uL (ref 150–400)
RBC: 3.73 MIL/uL — ABNORMAL LOW (ref 3.87–5.11)
RDW: 16.4 % — ABNORMAL HIGH (ref 11.5–15.5)
WBC: 5.8 10*3/uL (ref 4.0–10.5)
nRBC: 0 % (ref 0.0–0.2)

## 2019-11-07 LAB — COMPREHENSIVE METABOLIC PANEL
ALT: 15 U/L (ref 0–44)
AST: 23 U/L (ref 15–41)
Albumin: 3.6 g/dL (ref 3.5–5.0)
Alkaline Phosphatase: 82 U/L (ref 38–126)
Anion gap: 12 (ref 5–15)
BUN: 77 mg/dL — ABNORMAL HIGH (ref 6–20)
CO2: 23 mmol/L (ref 22–32)
Calcium: 8.9 mg/dL (ref 8.9–10.3)
Chloride: 98 mmol/L (ref 98–111)
Creatinine, Ser: 3.57 mg/dL — ABNORMAL HIGH (ref 0.44–1.00)
GFR calc Af Amer: 16 mL/min — ABNORMAL LOW (ref >60)
GFR calc non Af Amer: 14 mL/min — ABNORMAL LOW (ref >60)
Glucose, Bld: 240 mg/dL — ABNORMAL HIGH (ref 70–99)
Potassium: 5.8 mmol/L — ABNORMAL HIGH (ref 3.5–5.1)
Sodium: 133 mmol/L — ABNORMAL LOW (ref 135–145)
Total Bilirubin: 1.4 mg/dL — ABNORMAL HIGH (ref 0.3–1.2)
Total Protein: 9.3 g/dL — ABNORMAL HIGH (ref 6.5–8.1)

## 2019-11-07 LAB — BRAIN NATRIURETIC PEPTIDE: B Natriuretic Peptide: 584.6 pg/mL — ABNORMAL HIGH (ref 0.0–100.0)

## 2019-11-07 LAB — CBG MONITORING, ED: Glucose-Capillary: 223 mg/dL — ABNORMAL HIGH (ref 70–99)

## 2019-11-07 MED ORDER — DEXTROSE 50 % IV SOLN
1.0000 | Freq: Once | INTRAVENOUS | Status: AC
  Administered 2019-11-07: 50 mL via INTRAVENOUS
  Filled 2019-11-07: qty 50

## 2019-11-07 MED ORDER — INSULIN ASPART 100 UNIT/ML IV SOLN
10.0000 [IU] | Freq: Once | INTRAVENOUS | Status: AC
  Administered 2019-11-08: 10 [IU] via INTRAVENOUS

## 2019-11-07 MED ORDER — SODIUM ZIRCONIUM CYCLOSILICATE 5 G PO PACK
5.0000 g | PACK | Freq: Once | ORAL | Status: AC
  Administered 2019-11-07: 5 g via ORAL
  Filled 2019-11-07: qty 1

## 2019-11-07 NOTE — ED Provider Notes (Signed)
f  Emergency Department Provider Note   I have reviewed the triage vital signs and the nursing notes.   HISTORY  Chief Complaint Back Pain (+ neck and tremors x2 weeks after fall )   HPI [name] is a 54 y.o. female with PMH reviewed below presents to the ED for evaluation of tremor after fall earlier this month.  She states that she was being transported to the emergency department on a stretcher and she was tipped over falling to the ground still attached to the stretcher.  She report striking her head.  She came into the emergency department where she had CT scans of her head, cervical spine, chest x-ray.  She was ultimately discharged home.  She states that since that fall she has had intermittent tremors.  She also tells me that her fluid is accumulating in her legs.  She has been compliant with her diet changes and medications. Denies CP or SOB.  EMS placed the patient on 2 L nasal cannula oxygen for comfort but they did not record any hypoxemia and patient states she does not use O2 at home. Denies fever/chills.    Past Medical History:  Diagnosis Date  . Arthritis    "hands, arms, feet, back, knees" (02/17/2018)  . Cardiomyopathy [facility])    From Dr. Bonney Roussel office notes from 01/13/18  . CHF (congestive heart failure) ([place])   . Chronic lower back pain   . Diabetic neuropathy ([place])   . Family history of adverse reaction to anesthesia    Mother is hard to arouse and blood pressure drops  . GERD (gastroesophageal reflux disease)   . High cholesterol   . Hypertension   . Sleep apnea    "need new machine"  - does not use a cpap (02/17/2018)  . Type II diabetes mellitus ([place])    no longer on medications (02/17/2018)    Patient Active Problem List   Diagnosis Date Noted  . Acute on chronic diastolic CHF (congestive heart failure) ([place]) 10/16/2019  . CKD (chronic kidney disease), stage III 10/16/2019  . Acute exacerbation of CHF (congestive heart failure) ([place]) 10/16/2019    . Acute on chronic systolic congestive heart failure ([place]) 09/11/2019  . AKI (acute kidney injury) ([place]) 09/11/2019  . Chronic pain syndrome 09/11/2019  . Heart failure ([place]) 09/11/2019  . No-show for appointment 07/08/2019  . Chronic systolic heart failure ([place]) 04/26/2019  . S/P revision of total knee, right 05/14/2018  . Dislocated knee, right, sequela   . S/P revision of total knee 02/17/2018  . Failed total right knee replacement ([place])   . Chronic renal insufficiency, stage 2 (mild) 11/10/2017  . Acute CHF (congestive heart failure) ([place]) 11/10/2017  . Failed total knee arthroplasty, sequela 08/31/2017  . Lower extremity cellulitis 01/03/2013  . Diabetes mellitus ([place]) 01/03/2013  . Morbid obesity ([place]) 11/12/2006  . TOBACCO DEPENDENCE 11/12/2006  . Depression 11/12/2006  . Essential hypertension 11/12/2006  . GASTROESOPHAGEAL REFLUX, NO ESOPHAGITIS 11/12/2006  . ACNE 11/12/2006  . OSTEOARTHRITIS, LOWER LEG 11/12/2006  . OSA (obstructive sleep apnea) 11/12/2006    Past Surgical History:  Procedure Laterality Date  . ANTERIOR CERVICAL DECOMP/DISCECTOMY FUSION  03/06/2010   C4-7 w/corpectomy/notes 03/17/2010  . BACK SURGERY    . CARPAL TUNNEL RELEASE Right   . HARDWARE REVISION  04/10/2010   cervical hardware revision screw replacement C4/notes 04/12/2010  . JOINT REPLACEMENT    . LAPAROSCOPIC CHOLECYSTECTOMY  1999  . LUMBAR LAMINECTOMY/DECOMPRESSION MICRODISCECTOMY  09/2010   /  notes 10/05/2010  . MULTIPLE EXTRACTIONS WITH ALVEOLOPLASTY [name] 01/19/2015   Procedure: MULTIPLE EXTRACTIONS Three, Four, Five, Seven, eight, nine, ten, eleven, twelve, fourteen, eighteen, twenty-one, twenty-eight, twenty-nine, thirty-one with Alveoloplasty.  ;  Surgeon: Diona Browner, DDS;  Location: [place];  Service: Oral Surgery;  Laterality: [name];  . REPLACEMENT TOTAL KNEE BILATERAL Bilateral 10/2007-03/2008   "left-right"  . TOENAIL EXCISION Bilateral    great toes  . TONSILLECTOMY    . TOTAL KNEE  REVISION Right 02/17/2018  . TOTAL KNEE REVISION Right 02/17/2018   Procedure: RIGHT TOTAL KNEE REVISION;  Surgeon: Newt Minion, MD;  Location: [place];  Service: Orthopedics;  Laterality: Right;  . TOTAL KNEE REVISION Right 05/14/2018   Procedure: RIGHT TOTAL KNEE REVISION TO HINGED KNEE;  Surgeon: Newt Minion, MD;  Location: [place];  Service: Orthopedics;  Laterality: Right;    Allergies Patient has no known allergies.  Family History  Problem Relation Age of Onset  . Kidney disease Mother   . Congestive Heart Failure Mother   . Hypertension Father     Social History Social History   Tobacco Use  . Smoking status: Current Every Day Smoker    Packs/day: 0.50    Years: 32.00    Pack years: 16.00    Types: Cigarettes  . Smokeless tobacco: Never Used  Substance Use Topics  . Alcohol use: Not Currently  . Drug use: Not Currently    Types: Marijuana    Review of Systems  Constitutional: No fever/chills Eyes: No visual changes. ENT: No sore throat. Cardiovascular: Denies chest pain. Increased fluid retention.  Respiratory: Denies shortness of breath. Gastrointestinal: No abdominal pain.  No nausea, no vomiting.  No diarrhea.  No constipation. Genitourinary: Negative for dysuria. Musculoskeletal: Negative for back pain. Skin: Negative for rash. Neurological: Negative for headaches, focal weakness or numbness.  10-point ROS otherwise negative.  ____________________________________________   PHYSICAL EXAM:  VITAL SIGNS: ED Triage Vitals  Enc Vitals Group     BP 11/07/19 2053 130/86     Pulse Rate 11/07/19 2053 82     Resp 11/07/19 2053 18     Temp 11/07/19 2053 (!) 97.5 F (36.4 C)     Temp Source 11/07/19 2053 Oral     SpO2 11/07/19 2053 98 %   Constitutional: Alert and oriented. Well appearing and in no acute distress. Eyes: Conjunctivae are normal.  Head: Atraumatic. Nose: No congestion/rhinnorhea. Mouth/Throat: Mucous membranes are moist.   Neck: No  stridor.   Cardiovascular: Normal rate, regular rhythm. Good peripheral circulation. Grossly normal heart sounds.   Respiratory: Slight increased respiratory effort.  No retractions. Lungs with mild end-expiratory wheezing on exam.  Gastrointestinal: Soft and nontender. No distention.  Musculoskeletal: Bilateral pitting edema in the LEs extending to the lower abdomen.  Neurologic:  Normal speech and language. Equal strength in the upper and lower extremities. Occasional resting tremor.  Skin:  Skin is warm, dry and intact. No rash noted.   ____________________________________________   LABS (all labs ordered are listed, but only abnormal results are displayed)  Labs Reviewed  COMPREHENSIVE METABOLIC PANEL - Abnormal; Notable for the following components:      Result Value   Sodium 133 (*)    Potassium 5.8 (*)    Glucose, Bld 240 (*)    BUN 77 (*)    Creatinine, Ser 3.57 (*)    Total Protein 9.3 (*)    Total Bilirubin 1.4 (*)    GFR calc non Af Amer 14 (*)  GFR calc Af Amer 16 (*)    All other components within normal limits  CBC WITH DIFFERENTIAL/PLATELET - Abnormal; Notable for the following components:   RBC 3.73 (*)    Hemoglobin 10.3 (*)    HCT 35.6 (*)    MCHC 28.9 (*)    RDW 16.4 (*)    Lymphs Abs 0.6 (*)    All other components within normal limits  BRAIN NATRIURETIC PEPTIDE - Abnormal; Notable for the following components:   B Natriuretic Peptide 584.6 (*)    All other components within normal limits  CBG MONITORING, ED - Abnormal; Notable for the following components:   Glucose-Capillary 223 (*)    All other components within normal limits  SARS CORONAVIRUS 2 (TAT 6-24 HRS)   ____________________________________________  EKG  Rate: 81 PR: 184 QTc: 511  Sinus rhythm. No ST elevation or depression. Low voltage QRS. No STEMI.  ____________________________________________  RADIOLOGY  DG Chest 2 View  Result Date: 11/07/2019 CLINICAL DATA:  54 year old  female with shortness of breath and chest pain. EXAM: CHEST - 2 VIEW COMPARISON:  Chest radiograph dated 10/20/2019. FINDINGS: There is cardiomegaly with vascular congestion. No focal consolidation, pleural effusion, pneumothorax. No acute osseous pathology. Cervical ACDF. IMPRESSION: Cardiomegaly with vascular congestion, progressed since the prior radiograph. No focal consolidation. Electronically Signed   By: Anner Crete M.D.   On: 11/07/2019 22:06    ____________________________________________   PROCEDURES  Procedure(s) performed:   Procedures  CRITICAL CARE Performed by: Margette Fast Total critical care time: 35 minutes Critical care time was exclusive of separately billable procedures and treating other patients. Critical care was necessary to treat or prevent imminent or life-threatening deterioration. Critical care was time spent personally by me on the following activities: development of treatment plan with patient and/or surrogate as well as nursing, discussions with consultants, evaluation of patient's response to treatment, examination of patient, obtaining history from patient or surrogate, ordering and performing treatments and interventions, ordering and review of laboratory studies, ordering and review of radiographic studies, pulse oximetry and re-evaluation of patient's condition.  Nanda Quinton, MD Emergency Medicine  ____________________________________________   INITIAL IMPRESSION / ASSESSMENT AND PLAN / ED COURSE  Pertinent labs & imaging results that were available during my care of the patient were reviewed by me and considered in my medical decision making (see chart for details).   Patient presents to the emergency department evaluation of tremor.  Patient also describing some shortness of breath and fluid retention despite taking her medications.  She was seen by her cardiologist recently and seem to be doing well at home on her diuresis protocol and diet  changes.  The tremor is inconsistent and she does not have focal neurologic deficits.  She had imaging of her head and cervical spine after the fall with EMS on February 4 which were reviewed.  I do not see an indication to repeat that imaging.  Her chest x-ray here does show interval increase in vascular congestion. Will discuss with her Cardiology team.   Spoke with [facility] Cardiology MD on call, Dr. Rex Kras to discuss case and AKI on labs. He will follow in the AM but with AKI as the patient's principal issue he is requesting medicine admit. Potassium of 5.8 without EKG changes. Will shift here in the ED. Updated patient at bedside.   Discussed patient's case with [facility], Dr. Hal Hope to request admission. Patient and family (if present) updated with plan. Care transferred to [facility] service.  I  reviewed all nursing notes, vitals, pertinent old records, EKGs, labs, imaging (as available).  ____________________________________________  FINAL CLINICAL IMPRESSION(S) / ED DIAGNOSES  Final diagnoses:  AKI (acute kidney injury) ([place])  Hyperkalemia    MEDICATIONS GIVEN DURING THIS VISIT:  Medications  insulin aspart (novoLOG) injection 10 Units (has no administration in time range)  dextrose 50 % solution 50 mL (has no administration in time range)    Note:  This document was prepared using Dragon voice recognition software and may include unintentional dictation errors.  Nanda Quinton, MD, [facility] Emergency Medicine    Dana Dorner, [facility], MD 11/08/19 [phone]

## 2019-11-08 ENCOUNTER — Inpatient Hospital Stay (HOSPITAL_COMMUNITY): Payer: Medicare Other

## 2019-11-08 ENCOUNTER — Other Ambulatory Visit: Payer: Self-pay

## 2019-11-08 ENCOUNTER — Encounter (HOSPITAL_COMMUNITY): Payer: Self-pay | Admitting: Internal Medicine

## 2019-11-08 DIAGNOSIS — N1832 Chronic kidney disease, stage 3b: Secondary | ICD-10-CM

## 2019-11-08 DIAGNOSIS — I5023 Acute on chronic systolic (congestive) heart failure: Secondary | ICD-10-CM

## 2019-11-08 DIAGNOSIS — I10 Essential (primary) hypertension: Secondary | ICD-10-CM

## 2019-11-08 DIAGNOSIS — R140 Abdominal distension (gaseous): Secondary | ICD-10-CM

## 2019-11-08 DIAGNOSIS — N179 Acute kidney failure, unspecified: Secondary | ICD-10-CM

## 2019-11-08 DIAGNOSIS — E875 Hyperkalemia: Secondary | ICD-10-CM

## 2019-11-08 DIAGNOSIS — N182 Chronic kidney disease, stage 2 (mild): Secondary | ICD-10-CM

## 2019-11-08 DIAGNOSIS — E1122 Type 2 diabetes mellitus with diabetic chronic kidney disease: Secondary | ICD-10-CM

## 2019-11-08 LAB — URINALYSIS, ROUTINE W REFLEX MICROSCOPIC
Bilirubin Urine: NEGATIVE
Glucose, UA: NEGATIVE mg/dL
Ketones, ur: NEGATIVE mg/dL
Nitrite: NEGATIVE
Protein, ur: 100 mg/dL — AB
Specific Gravity, Urine: 1.015 (ref 1.005–1.030)
WBC, UA: >50 WBC/hpf — ABNORMAL HIGH (ref 0–5)
pH: 5 (ref 5.0–8.0)

## 2019-11-08 LAB — CBC WITH DIFFERENTIAL/PLATELET
Abs Immature Granulocytes: 0.03 10*3/uL (ref 0.00–0.07)
Basophils Absolute: 0 10*3/uL (ref 0.0–0.1)
Basophils Relative: 0 %
Eosinophils Absolute: 0 10*3/uL (ref 0.0–0.5)
Eosinophils Relative: 1 %
HCT: 34.4 % — ABNORMAL LOW (ref 36.0–46.0)
Hemoglobin: 10 g/dL — ABNORMAL LOW (ref 12.0–15.0)
Immature Granulocytes: 1 %
Lymphocytes Relative: 11 %
Lymphs Abs: 0.6 10*3/uL — ABNORMAL LOW (ref 0.7–4.0)
MCH: 27.2 pg (ref 26.0–34.0)
MCHC: 29.1 g/dL — ABNORMAL LOW (ref 30.0–36.0)
MCV: 93.5 fL (ref 80.0–100.0)
Monocytes Absolute: 0.6 10*3/uL (ref 0.1–1.0)
Monocytes Relative: 12 %
Neutro Abs: 4.1 10*3/uL (ref 1.7–7.7)
Neutrophils Relative %: 75 %
Platelets: 196 10*3/uL (ref 150–400)
RBC: 3.68 MIL/uL — ABNORMAL LOW (ref 3.87–5.11)
RDW: 16.2 % — ABNORMAL HIGH (ref 11.5–15.5)
WBC: 5.4 10*3/uL (ref 4.0–10.5)
nRBC: 0 % (ref 0.0–0.2)

## 2019-11-08 LAB — CREATININE, URINE, RANDOM: Creatinine, Urine: 222.13 mg/dL

## 2019-11-08 LAB — HEPATIC FUNCTION PANEL
ALT: 16 U/L (ref 0–44)
AST: 21 U/L (ref 15–41)
Albumin: 3.6 g/dL (ref 3.5–5.0)
Alkaline Phosphatase: 83 U/L (ref 38–126)
Bilirubin, Direct: 0.6 mg/dL — ABNORMAL HIGH (ref 0.0–0.2)
Indirect Bilirubin: 0.7 mg/dL (ref 0.3–0.9)
Total Bilirubin: 1.3 mg/dL — ABNORMAL HIGH (ref 0.3–1.2)
Total Protein: 9 g/dL — ABNORMAL HIGH (ref 6.5–8.1)

## 2019-11-08 LAB — BASIC METABOLIC PANEL
Anion gap: 11 (ref 5–15)
BUN: 76 mg/dL — ABNORMAL HIGH (ref 6–20)
CO2: 24 mmol/L (ref 22–32)
Calcium: 8.9 mg/dL (ref 8.9–10.3)
Chloride: 98 mmol/L (ref 98–111)
Creatinine, Ser: 3.59 mg/dL — ABNORMAL HIGH (ref 0.44–1.00)
GFR calc Af Amer: 16 mL/min — ABNORMAL LOW (ref >60)
GFR calc non Af Amer: 14 mL/min — ABNORMAL LOW (ref >60)
Glucose, Bld: 205 mg/dL — ABNORMAL HIGH (ref 70–99)
Potassium: 5.4 mmol/L — ABNORMAL HIGH (ref 3.5–5.1)
Sodium: 133 mmol/L — ABNORMAL LOW (ref 135–145)

## 2019-11-08 LAB — SARS CORONAVIRUS 2 (TAT 6-24 HRS): SARS Coronavirus 2: NEGATIVE

## 2019-11-08 LAB — GLUCOSE, CAPILLARY
Glucose-Capillary: 205 mg/dL — ABNORMAL HIGH (ref 70–99)
Glucose-Capillary: 175 mg/dL — ABNORMAL HIGH (ref 70–99)
Glucose-Capillary: 164 mg/dL — ABNORMAL HIGH (ref 70–99)
Glucose-Capillary: 167 mg/dL — ABNORMAL HIGH (ref 70–99)

## 2019-11-08 LAB — NA AND K (SODIUM & POTASSIUM), RAND UR
Potassium Urine: 52 mmol/L
Sodium, Ur: <10 mmol/L

## 2019-11-08 LAB — TROPONIN I (HIGH SENSITIVITY): Troponin I (High Sensitivity): 19 ng/L — ABNORMAL HIGH (ref <18)

## 2019-11-08 MED ORDER — HEPARIN SODIUM (PORCINE) 5000 UNIT/ML IJ SOLN
5000.0000 [IU] | Freq: Three times a day (TID) | INTRAMUSCULAR | Status: DC
  Administered 2019-11-08 – 2019-11-11 (×10): 5000 [IU] via SUBCUTANEOUS
  Filled 2019-11-08 (×10): qty 1

## 2019-11-08 MED ORDER — SODIUM CHLORIDE 0.9 % IV SOLN
1.0000 g | INTRAVENOUS | Status: AC
  Administered 2019-11-08 – 2019-11-12 (×5): 1 g via INTRAVENOUS
  Filled 2019-11-08 (×4): qty 10

## 2019-11-08 MED ORDER — SODIUM ZIRCONIUM CYCLOSILICATE 5 G PO PACK
5.0000 g | PACK | Freq: Once | ORAL | Status: AC
  Administered 2019-11-08: 5 g via ORAL
  Filled 2019-11-08: qty 1

## 2019-11-08 MED ORDER — ACETAMINOPHEN 650 MG RE SUPP: 650.0000 mg | Freq: Four times a day (QID) | RECTAL | Status: DC | PRN

## 2019-11-08 MED ORDER — FUROSEMIDE 10 MG/ML IJ SOLN
40.0000 mg | Freq: Two times a day (BID) | INTRAMUSCULAR | Status: DC
  Administered 2019-11-08 – 2019-11-09 (×3): 40 mg via INTRAVENOUS
  Filled 2019-11-08 (×3): qty 4

## 2019-11-08 MED ORDER — ALPRAZOLAM 0.25 MG PO TABS
0.2500 mg | ORAL_TABLET | Freq: Two times a day (BID) | ORAL | Status: DC | PRN
  Administered 2019-11-10 – 2019-11-12 (×3): 0.25 mg via ORAL
  Filled 2019-11-08 (×4): qty 1

## 2019-11-08 MED ORDER — ALLOPURINOL 300 MG PO TABS
300.0000 mg | ORAL_TABLET | Freq: Every day | ORAL | Status: DC
  Administered 2019-11-08 – 2019-11-21 (×14): 300 mg via ORAL
  Filled 2019-11-08 (×3): qty 1
  Filled 2019-11-08: qty 3
  Filled 2019-11-08 (×10): qty 1

## 2019-11-08 MED ORDER — ISOSORB DINITRATE-HYDRALAZINE 20-37.5 MG PO TABS
2.0000 | ORAL_TABLET | Freq: Three times a day (TID) | ORAL | Status: DC
  Administered 2019-11-08 – 2019-11-11 (×9): 2 via ORAL
  Filled 2019-11-08 (×9): qty 2

## 2019-11-08 MED ORDER — SENNOSIDES-DOCUSATE SODIUM 8.6-50 MG PO TABS
2.0000 | ORAL_TABLET | Freq: Every evening | ORAL | Status: DC | PRN
  Administered 2019-11-19: 2 via ORAL
  Filled 2019-11-08: qty 2

## 2019-11-08 MED ORDER — ACETAMINOPHEN 325 MG PO TABS: 650.0000 mg | ORAL_TABLET | Freq: Four times a day (QID) | ORAL | Status: DC | PRN

## 2019-11-08 MED ORDER — CHLORHEXIDINE GLUCONATE CLOTH 2 % EX PADS
6.0000 | MEDICATED_PAD | Freq: Every day | CUTANEOUS | Status: DC
  Administered 2019-11-10 – 2019-11-21 (×12): 6 via TOPICAL

## 2019-11-08 MED ORDER — METOPROLOL SUCCINATE ER 25 MG PO TB24
25.0000 mg | ORAL_TABLET | Freq: Every day | ORAL | Status: DC
  Administered 2019-11-08 – 2019-11-21 (×14): 25 mg via ORAL
  Filled 2019-11-08 (×14): qty 1

## 2019-11-08 MED ORDER — OXYCODONE-ACETAMINOPHEN 5-325 MG PO TABS
1.0000 | ORAL_TABLET | Freq: Four times a day (QID) | ORAL | Status: DC | PRN
  Administered 2019-11-08 – 2019-11-14 (×2): 1 via ORAL
  Filled 2019-11-08 (×2): qty 1

## 2019-11-08 MED ORDER — AMLODIPINE BESYLATE 5 MG PO TABS
5.0000 mg | ORAL_TABLET | Freq: Every day | ORAL | Status: DC
  Administered 2019-11-08 – 2019-11-11 (×4): 5 mg via ORAL
  Filled 2019-11-08 (×4): qty 1

## 2019-11-08 MED ORDER — OXYCODONE HCL ER 10 MG PO T12A
20.0000 mg | EXTENDED_RELEASE_TABLET | Freq: Two times a day (BID) | ORAL | Status: DC
  Administered 2019-11-08 – 2019-11-16 (×16): 20 mg via ORAL
  Filled 2019-11-08 (×17): qty 2

## 2019-11-08 MED ORDER — POLYETHYLENE GLYCOL 3350 17 G PO PACK
17.0000 g | PACK | Freq: Every day | ORAL | Status: DC | PRN
  Administered 2019-11-19: 17 g via ORAL
  Filled 2019-11-08: qty 1

## 2019-11-08 MED ORDER — SODIUM CHLORIDE 0.9 % IV SOLN
1.0000 g | INTRAVENOUS | Status: DC
  Filled 2019-11-08: qty 10

## 2019-11-08 MED ORDER — BUPRENORPHINE 20 MCG/HR TD PTWK
1.0000 | MEDICATED_PATCH | TRANSDERMAL | Status: DC
  Administered 2019-11-12: 1 via TRANSDERMAL

## 2019-11-08 MED ORDER — INSULIN ASPART 100 UNIT/ML ~~LOC~~ SOLN
0.0000 [IU] | Freq: Three times a day (TID) | SUBCUTANEOUS | Status: DC
  Administered 2019-11-08: 2 [IU] via SUBCUTANEOUS
  Administered 2019-11-08 – 2019-11-09 (×5): 1 [IU] via SUBCUTANEOUS
  Administered 2019-11-10: 3 [IU] via SUBCUTANEOUS
  Administered 2019-11-10 – 2019-11-18 (×8): 1 [IU] via SUBCUTANEOUS

## 2019-11-08 NOTE — ED Notes (Signed)
Pt came to the ED via EMS. Pt conscious, breathing, and A&Ox4. Pt brought to HW15 via stretcher. Pt endorses "I have neck and lumbar pain from a fall two weeks ago". Chest rise and fall equally with non-labored breathing. Lungs clear apex to base. Abd soft and non-tender. Pt denies chest pain, n/v/d, shortness of breath, and f/c. Pt endorses pain 5 out of 10 pain that's vice-like. PIVC placed on the left hand with a 20G which had positive blood return and flushed without pain or infiltration. Blood collected, labeled, and sent to lab. Bed in lowest position with call light within reach. Pt on continuous blood pressure, pulse ox, and cardiac monitor. Will continue to monitor. Awaiting MD eval. No distress noted.

## 2019-11-08 NOTE — H&P (Signed)
History and Physical    [name] ZSW:[id number] DOB: 01/01/1966 DOA: 11/07/2019  PCP: Vonna Drafts, FNP  Patient coming from: Home.  Chief Complaint: Neck pain low back pain tremors.  HPI: [name] is a 54 y.o. female with history of chronic systolic heart failure, diabetes mellitus, morbid obesity, chronic kidney disease who was admitted January last month for CHF exacerbation subsequent which patient came back to the ER the following week for accidental overdose of oxycodone presents to the ER after patient has been having increasing neck pain and low back pain after having a fall about 2 weeks ago.  Denies any weakness of the extremities or any incontinence of urine or bowel.  Patient has noted increasing tremors of the extremities.  Patient also noted increasing bilateral lower extremity edema with no definite shortness of breath or chest pain.  ED Course: In the ER on exam patient has significant bilateral lower extremity edema.  Patient also has some myoclonic jerks on exam.  Labs show potassium of 5.3 creatinine is increased from 1.9 presently 3.5 hemoglobin 10.3 BNP 584 chest x-ray shows congestion.  Patient was given D50 IV insulin and Lokelma for the hyperkalemia.  ER physician also discussed with on-call cardiologist for [facility] cardiology who will be seeing patient in consult because there is concern for CHF with worsening renal function.  Review of Systems: As per HPI, rest all negative.   Past Medical History:  Diagnosis Date  . Arthritis    "hands, arms, feet, back, knees" (02/17/2018)  . Cardiomyopathy [facility])    From Dr. Bonney Roussel office notes from 01/13/18  . CHF (congestive heart failure) ([place])   . Chronic lower back pain   . Diabetic neuropathy ([place])   . Family history of adverse reaction to anesthesia    Mother is hard to arouse and blood pressure drops  . GERD (gastroesophageal reflux disease)   . High cholesterol   . Hypertension   . Sleep  apnea    "need new machine"  - does not use a cpap (02/17/2018)  . Type II diabetes mellitus ([place])    no longer on medications (02/17/2018)    Past Surgical History:  Procedure Laterality Date  . ANTERIOR CERVICAL DECOMP/DISCECTOMY FUSION  03/06/2010   C4-7 w/corpectomy/notes 03/17/2010  . BACK SURGERY    . CARPAL TUNNEL RELEASE Right   . HARDWARE REVISION  04/10/2010   cervical hardware revision screw replacement C4/notes 04/12/2010  . JOINT REPLACEMENT    . LAPAROSCOPIC CHOLECYSTECTOMY  1999  . LUMBAR LAMINECTOMY/DECOMPRESSION MICRODISCECTOMY  09/2010   Archie Endo 10/05/2010  . MULTIPLE EXTRACTIONS WITH ALVEOLOPLASTY [name] 01/19/2015   Procedure: MULTIPLE EXTRACTIONS Three, Four, Five, Seven, eight, nine, ten, eleven, twelve, fourteen, eighteen, twenty-one, twenty-eight, twenty-nine, thirty-one with Alveoloplasty.  ;  Surgeon: Diona Browner, DDS;  Location: [place];  Service: Oral Surgery;  Laterality: [name];  . REPLACEMENT TOTAL KNEE BILATERAL Bilateral 10/2007-03/2008   "left-right"  . TOENAIL EXCISION Bilateral    great toes  . TONSILLECTOMY    . TOTAL KNEE REVISION Right 02/17/2018  . TOTAL KNEE REVISION Right 02/17/2018   Procedure: RIGHT TOTAL KNEE REVISION;  Surgeon: Newt Minion, MD;  Location: [place];  Service: Orthopedics;  Laterality: Right;  . TOTAL KNEE REVISION Right 05/14/2018   Procedure: RIGHT TOTAL KNEE REVISION TO HINGED KNEE;  Surgeon: Newt Minion, MD;  Location: [place];  Service: Orthopedics;  Laterality: Right;     reports that she has been smoking cigarettes. She  has a 16.00 pack-year smoking history. She has never used smokeless tobacco. She reports previous alcohol use. She reports previous drug use. Drug: Marijuana.  No Known Allergies  Family History  Problem Relation Age of Onset  . Kidney disease Mother   . Congestive Heart Failure Mother   . Hypertension Father     Prior to Admission medications   Medication Sig Start Date End Date Taking? Authorizing Provider    albuterol (PROVENTIL) (2.5 MG/3ML) 0.083% nebulizer solution Take 2.5 mg by nebulization every 4 (four) hours as needed for wheezing.  08/22/19  Yes [provider]  allopurinol (ZYLOPRIM) 300 MG tablet Take 300 mg by mouth daily.  02/03/19  Yes [provider]  ALPRAZolam (XANAX) 0.25 MG tablet Take 0.25 mg by mouth 2 (two) times daily as needed for anxiety.  02/03/19  Yes [provider]  amLODipine (NORVASC) 5 MG tablet Take 1 tablet (5 mg total) by mouth daily. 10/24/19 01/22/20 Yes Miquel Dunn, NP  buprenorphine Haze Rushing) 20 MCG/HR PTWK patch Place 1 patch onto the skin every Saturday.  08/23/19  Yes [provider]  furosemide (LASIX) 40 MG tablet Take 1 tablet (40 mg total) by mouth 2 (two) times daily. 09/19/19 09/18/20 Yes Vann, Jessica U, DO  gabapentin (NEURONTIN) 100 MG capsule Take 100 mg by mouth at bedtime as needed (pain).  08/22/19  Yes [provider]  isosorbide-hydrALAZINE (BIDIL) 20-37.5 MG tablet Take 2 tablets by mouth 3 (three) times daily. 09/26/19  Yes Miquel Dunn, NP  linagliptin (TRADJENTA) 5 MG TABS tablet Take 1 tablet (5 mg total) by mouth daily. 09/20/19  Yes Vann, Jessica U, DO  metoprolol succinate (TOPROL-XL) 25 MG 24 hr tablet TAKE 1 TABLET(25 MG) BY MOUTH DAILY WITH OR IMMEDIATELY FOLLOWING A MEAL Patient taking differently: Take 25 mg by mouth daily.  10/21/19  Yes Miquel Dunn, NP  [facility] 4 MG/0.1ML LIQD nasal spray kit Place 0.4 mg into the nose as needed (for overdose).  12/28/18  Yes [provider]  oxyCODONE-acetaminophen (PERCOCET) 10-325 MG tablet Take 1 tablet by mouth every 8 (eight) hours as needed for pain. Patient taking differently: Take 1 tablet by mouth in the morning, at noon, in the evening, and at bedtime.  09/30/18  Yes Rayburn, Neta Mends, PA-C  OZEMPIC, 0.25 OR 0.5 MG/DOSE, 2 MG/1.5ML SOPN Inject 0.25 mg into the skin every Wednesday. 10/26/19  Yes [provider]   Vitamin D, Ergocalciferol, (DRISDOL) 1.25 MG (50000 UT) CAPS capsule Take 50,000 Units by mouth every Tuesday.  07/26/19  Yes [provider]  XTAMPZA ER 36 MG C12A Take 36 mg by mouth 2 (two) times daily.  08/25/19  Yes [provider]    Physical Exam: Constitutional: Moderately built and nourished. Vitals:   11/07/19 2053 11/08/19 0008 11/08/19 0116 11/08/19 0132  BP: 130/86 130/83  135/78  Pulse: 82 84  81  Resp: 18 20  20   Temp: (!) 97.5 F (36.4 C)   97.8 F (36.6 C)  TempSrc: Oral   Oral  SpO2: 98% 96%  99%  Weight:   (!) 217.7 kg   Height:   5' 3"  (1.6 m)    Eyes: Anicteric no pallor. ENMT: No discharge from the ears eyes nose or mouth. Neck: No mass or.  No neck rigidity. Respiratory: No rhonchi or crepitations. Cardiovascular: S1-S2 heard. Abdomen: Soft nontender bowel sound present. Musculoskeletal: Bilateral lower extremity edema present with chronic skin changes. Skin: Chronic skin changes of  the lower extremity. Neurologic: Alert awake oriented to time place and person.  Moves all extremities. Psychiatric: Appears normal per normal affect.   Labs on Admission: I have personally reviewed following labs and imaging studies  CBC: Recent Labs  Lab 11/07/19 2145  WBC 5.8  NEUTROABS 4.6  HGB 10.3*  HCT 35.6*  MCV 95.4  PLT 343   Basic Metabolic Panel: Recent Labs  Lab 11/07/19 2145  NA 133*  K 5.8*  CL 98  CO2 23  GLUCOSE 240*  BUN 77*  CREATININE 3.57*  CALCIUM 8.9   GFR: Estimated Creatinine Clearance: 34.5 mL/min (A) (by C-G formula based on SCr of 3.57 mg/dL (H)). Liver Function Tests: Recent Labs  Lab 11/07/19 2145  AST 23  ALT 15  ALKPHOS 82  BILITOT 1.4*  PROT 9.3*  ALBUMIN 3.6   No results for input(s): LIPASE, AMYLASE in the last 168 hours. No results for input(s): AMMONIA in the last 168 hours. Coagulation Profile: No results for input(s): INR, PROTIME in the last 168 hours. Cardiac Enzymes: No results  for input(s): CKTOTAL, CKMB, CKMBINDEX, TROPONINI in the last 168 hours. BNP (last 3 results) No results for input(s): PROBNP in the last 8760 hours. HbA1C: No results for input(s): HGBA1C in the last 72 hours. CBG: Recent Labs  Lab 11/07/19 2051  GLUCAP 223*   Lipid Profile: No results for input(s): CHOL, HDL, LDLCALC, TRIG, CHOLHDL, LDLDIRECT in the last 72 hours. Thyroid Function Tests: No results for input(s): TSH, T4TOTAL, FREET4, T3FREE, THYROIDAB in the last 72 hours. Anemia Panel: No results for input(s): VITAMINB12, FOLATE, FERRITIN, TIBC, IRON, RETICCTPCT in the last 72 hours. Urine analysis:    Component Value Date/Time   COLORURINE YELLOW 12/31/2018 [place] 12/31/2018 0457   LABSPEC 1.009 12/31/2018 0457   PHURINE 5.0 12/31/2018 0457   GLUCOSEU >=500 (A) 12/31/2018 0457   HGBUR NEGATIVE 12/31/2018 0457   BILIRUBINUR NEGATIVE 12/31/2018 0457   KETONESUR NEGATIVE 12/31/2018 0457   PROTEINUR NEGATIVE 12/31/2018 0457   UROBILINOGEN 1.0 05/16/2013 1758   NITRITE POSITIVE (A) 12/31/2018 0457   LEUKOCYTESUR TRACE (A) 12/31/2018 0457   Sepsis Labs: @LABRCNTIP (procalcitonin:4,lacticidven:4) ) Recent Results (from the past 240 hour(s))  SARS CORONAVIRUS 2 (TAT 6-24 HRS) Nasopharyngeal Nasopharyngeal Swab     Status: None   Collection Time: 11/07/19 11:10 PM   Specimen: Nasopharyngeal Swab  Result Value Ref Range Status   SARS Coronavirus 2 NEGATIVE NEGATIVE Final    Comment: (NOTE) SARS-CoV-2 target nucleic acids are NOT DETECTED. The SARS-CoV-2 RNA is generally detectable in upper and lower respiratory specimens during the acute phase of infection. Negative results do not preclude SARS-CoV-2 infection, do not rule out co-infections with other pathogens, and should not be used as the sole basis for treatment or other patient management decisions. Negative results must be combined with clinical observations, patient history, and epidemiological  information. The expected result is Negative. Fact Sheet for Patients: SugarRoll.be Fact Sheet for Healthcare Providers: https://www.woods-mathews.com/ This test is not yet approved or cleared by the [place] FDA and  has been authorized for detection and/or diagnosis of SARS-CoV-2 by FDA under an Emergency Use Authorization (EUA). This EUA will remain  in effect (meaning this test can be used) for the duration of the COVID-19 declaration under Section 56 4(b)(1) of the Act, 21 U.S.C. section 360bbb-3(b)(1), unless the authorization is terminated or revoked sooner. Performed at [facility], [place] [address], [place], [place] [address]  Radiological Exams on Admission: DG Chest 2 View  Result Date: 11/07/2019 CLINICAL DATA:  54 year old female with shortness of breath and chest pain. EXAM: CHEST - 2 VIEW COMPARISON:  Chest radiograph dated 10/20/2019. FINDINGS: There is cardiomegaly with vascular congestion. No focal consolidation, pleural effusion, pneumothorax. No acute osseous pathology. Cervical ACDF. IMPRESSION: Cardiomegaly with vascular congestion, progressed since the prior radiograph. No focal consolidation. Electronically Signed   By: Anner Crete M.D.   On: 11/07/2019 22:06     Assessment/Plan Principal Problem:   ARF (acute renal failure) (HCC) Active Problems:   Essential hypertension   Diabetes mellitus (HCC)   Acute on chronic systolic congestive heart failure (HCC)   CKD (chronic kidney disease), stage III   Hyperkalemia    1. Acute on chronic kidney disease stage IV with hyperkalemia-check UA.  Exact cause for the renal failure not sure but patient looks edematous at this time.  Patient states that time she does get diarrhea.  Denies taking any NSAIDs.  Not on any ACE inhibitor or ARB's.  Possible UTI could be cardiorenal syndrome.  May need renal input.  For hyperkalemia patient received Lokelma IV insulin  and D50. 2. Acute on chronic systolic heart failure last EF measured was 35 to 40% in December 2020.  Patient likely will need Lasix but will await nephrology input since patient has worsening renal function.  Continue BiDil Toprol-XL. 3. Tremors/myoclonic jerks with renal function worsening.  I am decreasing patient's long-acting pain medication dose by half and also the as needed dose also by half.  We will hold her gabapentin for now.  Closely monitor.  Discussed with pharmacy about the dose. 4. Neck pain and low back pain for the last 2 weeks.  Will get CT C-spine and L-spine. 5. Diabetes mellitus type 2 we will keep patient on sliding scale coverage. 6. Hypertension on amlodipine and Imdur. 7. Anemia likely from renal disease follow CBC.  Given the worsening renal function with CHF features will need close monitoring for any further deterioration in inpatient status.   DVT prophylaxis: Heparin. Code Status: Full code. Family Communication: Discussed with patient. Disposition Plan: To be determined. Consults called: ER physician discussed with cardiology. Admission status: Inpatient.   Rise Patience MD [facility] Pager [phone].  If 7PM-7AM, please contact night-coverage [email or web address] Password [facility]  11/08/2019, 4:10 AM

## 2019-11-08 NOTE — Progress Notes (Addendum)
Patient admitted overnight for concerns with accidental overdose of oxycodone, multiple electrolyte normality, acute kidney injury.  Recent CHF exacerbation, cardiology team consulted by the admitting team.  Patient is quite uncomfortable at this time reporting of abdominal distention and generally very uncomfortable on the current bed she is on.  Her vital signs are stable with bilateral diminished breath sounds-secondary to body habitus, abdomen is quite distended with diminished bowel sounds, 3+ bilateral lower extremity edema going up to her hips.  Chronic bilateral lower extremity skin changes.  CT of the C-spine and lumbar spine negative for any acute pathology.  Acute kidney injury with baseline creatinine of 1.98, now increased to 3.5.  UA suggestive of urinary tract infection.  Difficult to obtain bladder scan given her body habitus, renal ultrasound also really nonconclusive because of her body habitus.  We will go ahead and place Foley catheter in.  We will need strict input and output.  Concerned about acute abdomen?.  Significant abdominal distention-unclear exact etiology, stat CT of the abdomen pelvis ordered.  NPO.  Acute urinary tract infection-empirically start IV Rocephin.  Follow culture data.  Neck and low back pain-CT of C-spine and lumbar spine  Hyperkalemia-Lokelma and D50/insulin given overnight.  Will repeat dose of Lokelma, potassium 5.4.  Prolonged QTC-QTC 550, monitor this.  Acute on chronic systolic congestive heart failure EF 35%-cardiology consulted.  Continue home medications.  Tremors/myoclonic jerks-medication related?,  Gabapentin on hold.  Slowly resume as renal function improves  Diabetes mellitus type 2-sliding scale.  While n.p.o. Accu-Chek every 6 hours   Updated 450pm: No acute Abd findings on CT A/P. Cont plan as above including diuresis.  Time Spent: 30 mins Jiro Kiester MD

## 2019-11-08 NOTE — ED Notes (Signed)
RN to RN report given to 3E with no questions or concerns.

## 2019-11-08 NOTE — Consult Note (Signed)
CARDIOLOGY CONSULT NOTE  Patient ID: [name] MRN: [id number] DOB/AGE: Feb 27, 1966 54 y.o.  Admit date: 11/07/2019 Referring Physician: [facility]/ [facility] hospitalist Reason for Consultation:  CHG exacerbation  HPI:   54 year old African-American female with hypertension, type II diabetes mellitus, tobacco abuse, morbid obesity BMI 67, heart failure with reduced ejection fraction (likely nonischemic cardiomyopathy), neagtive nuclear stress test 01/2018.  Patient presented to [facility] emergency department on 11/07/2019 with complaints of "tremors".  Patient was unable to provide any detailed history this morning and kept stating that" this started after she was dropped off by ER".  She states that she has been compliant with her diet and medications.  Work-up in the ED was remarkable for significant leg edema, elevated creatinine and potassium at 3.97 and 5.8 respectively. BNP elevated at 584.   Patient was admitted to [facility] in January 2021 twice with worsening heart failure exacerbation.  Past Medical History:  Diagnosis Date  . Arthritis    "hands, arms, feet, back, knees" (02/17/2018)  . Cardiomyopathy [facility])    From Dr. Bonney Roussel office notes from 01/13/18  . CHF (congestive heart failure) ([place])   . Chronic lower back pain   . Diabetic neuropathy ([place])   . Family history of adverse reaction to anesthesia    Mother is hard to arouse and blood pressure drops  . GERD (gastroesophageal reflux disease)   . High cholesterol   . Hypertension   . Sleep apnea    "need new machine"  - does not use a cpap (02/17/2018)  . Type II diabetes mellitus ([place])    no longer on medications (02/17/2018)     Past Surgical History:  Procedure Laterality Date  . ANTERIOR CERVICAL DECOMP/DISCECTOMY FUSION  03/06/2010   C4-7 w/corpectomy/notes 03/17/2010  . BACK SURGERY    . CARPAL TUNNEL RELEASE Right   . HARDWARE REVISION  04/10/2010   cervical hardware revision screw replacement  C4/notes 04/12/2010  . JOINT REPLACEMENT    . LAPAROSCOPIC CHOLECYSTECTOMY  1999  . LUMBAR LAMINECTOMY/DECOMPRESSION MICRODISCECTOMY  09/2010   Archie Endo 10/05/2010  . MULTIPLE EXTRACTIONS WITH ALVEOLOPLASTY [name] 01/19/2015   Procedure: MULTIPLE EXTRACTIONS Three, Four, Five, Seven, eight, nine, ten, eleven, twelve, fourteen, eighteen, twenty-one, twenty-eight, twenty-nine, thirty-one with Alveoloplasty.  ;  Surgeon: Diona Browner, DDS;  Location: [place];  Service: Oral Surgery;  Laterality: [name];  . REPLACEMENT TOTAL KNEE BILATERAL Bilateral 10/2007-03/2008   "left-right"  . TOENAIL EXCISION Bilateral    great toes  . TONSILLECTOMY    . TOTAL KNEE REVISION Right 02/17/2018  . TOTAL KNEE REVISION Right 02/17/2018   Procedure: RIGHT TOTAL KNEE REVISION;  Surgeon: Newt Minion, MD;  Location: [place];  Service: Orthopedics;  Laterality: Right;  . TOTAL KNEE REVISION Right 05/14/2018   Procedure: RIGHT TOTAL KNEE REVISION TO HINGED KNEE;  Surgeon: Newt Minion, MD;  Location: [place];  Service: Orthopedics;  Laterality: Right;     Family History  Problem Relation Age of Onset  . Kidney disease Mother   . Congestive Heart Failure Mother   . Hypertension Father      Social History: Social History   Socioeconomic History  . Marital status: Widowed    Spouse name: Not on file  . Number of children: 2  . Years of education: Not on file  . Highest education level: Not on file  Occupational History  . Not on file  Tobacco Use  . Smoking status: Current Every Day Smoker  Packs/day: 0.50    Years: 32.00    Pack years: 16.00    Types: Cigarettes  . Smokeless tobacco: Never Used  Substance and Sexual Activity  . Alcohol use: Not Currently  . Drug use: Not Currently    Types: Marijuana  . Sexual activity: Not Currently  Other Topics Concern  . Not on file  Social History Narrative  . Not on file   Social Determinants of Health   Financial Resource Strain:   . Difficulty of Paying Living  Expenses: Not on file  Food Insecurity: Food Insecurity Present  . Worried About Charity fundraiser in the Last Year: Sometimes true  . Ran Out of Food in the Last Year: Not on file  Transportation Needs: No Transportation Needs  . Lack of Transportation (Medical): No  . Lack of Transportation (Non-Medical): No  Physical Activity:   . Days of Exercise per Week: Not on file  . Minutes of Exercise per Session: Not on file  Stress:   . Feeling of Stress : Not on file  Social Connections:   . Frequency of Communication with Friends and Family: Not on file  . Frequency of Social Gatherings with Friends and Family: Not on file  . Attends Religious Services: Not on file  . Active Member of Clubs or Organizations: Not on file  . Attends Archivist Meetings: Not on file  . Marital Status: Not on file  Intimate Partner Violence:   . Fear of Current or Ex-Partner: Not on file  . Emotionally Abused: Not on file  . Physically Abused: Not on file  . Sexually Abused: Not on file     Medications Prior to Admission  Medication Sig Dispense Refill Last Dose  . albuterol (PROVENTIL) (2.5 MG/3ML) 0.083% nebulizer solution Take 2.5 mg by nebulization every 4 (four) hours as needed for wheezing.    Past Week at Unknown time  . allopurinol (ZYLOPRIM) 300 MG tablet Take 300 mg by mouth daily.    11/06/2019 at Unknown time  . ALPRAZolam (XANAX) 0.25 MG tablet Take 0.25 mg by mouth 2 (two) times daily as needed for anxiety.    Past Month at Unknown time  . amLODipine (NORVASC) 5 MG tablet Take 1 tablet (5 mg total) by mouth daily. 30 tablet 2 11/06/2019 at Unknown time  . buprenorphine (BUTRANS) 20 MCG/HR PTWK patch Place 1 patch onto the skin every Saturday.    11/05/2019 at Unknown time  . furosemide (LASIX) 40 MG tablet Take 1 tablet (40 mg total) by mouth 2 (two) times daily. 60 tablet 0 11/06/2019 at Unknown time  . gabapentin (NEURONTIN) 100 MG capsule Take 100 mg by mouth at bedtime as needed  (pain).    Past Week at Unknown time  . isosorbide-hydrALAZINE (BIDIL) 20-37.5 MG tablet Take 2 tablets by mouth 3 (three) times daily. 90 tablet 2 11/06/2019 at Unknown time  . linagliptin (TRADJENTA) 5 MG TABS tablet Take 1 tablet (5 mg total) by mouth daily. 30 tablet 0 11/06/2019 at Unknown time  . metoprolol succinate (TOPROL-XL) 25 MG 24 hr tablet TAKE 1 TABLET(25 MG) BY MOUTH DAILY WITH OR IMMEDIATELY FOLLOWING A MEAL (Patient taking differently: Take 25 mg by mouth daily. ) 90 tablet 3 11/06/2019 at 0800  . NARCAN 4 MG/0.1ML LIQD nasal spray kit Place 0.4 mg into the nose as needed (for overdose).    unknown  . oxyCODONE-acetaminophen (PERCOCET) 10-325 MG tablet Take 1 tablet by mouth every 8 (eight) hours as  needed for pain. (Patient taking differently: Take 1 tablet by mouth in the morning, at noon, in the evening, and at bedtime. ) 28 tablet 0 11/06/2019 at Unknown time  . OZEMPIC, 0.25 OR 0.5 MG/DOSE, 2 MG/1.5ML SOPN Inject 0.25 mg into the skin every Wednesday.   11/02/2019 at Unknown time  . Vitamin D, Ergocalciferol, (DRISDOL) 1.25 MG (50000 UT) CAPS capsule Take 50,000 Units by mouth every Tuesday.    11/01/2019 at Unknown time  . XTAMPZA ER 36 MG C12A Take 36 mg by mouth 2 (two) times daily.    11/06/2019 at Unknown time    Review of Systems  Constitution: Negative for decreased appetite, malaise/fatigue, weight gain and weight loss.  HENT: Negative for congestion.   Eyes: Negative for visual disturbance.  Cardiovascular: Positive for dyspnea on exertion and leg swelling. Negative for chest pain, palpitations and syncope.  Respiratory: Negative for cough.   Endocrine: Negative for cold intolerance.  Hematologic/Lymphatic: Does not bruise/bleed easily.  Skin: Negative for itching and rash.  Musculoskeletal: Negative for myalgias.  Gastrointestinal: Negative for abdominal pain, nausea and vomiting.  Genitourinary: Negative for dysuria.  Neurological: Positive for tremors. Negative for  dizziness and weakness.  Psychiatric/Behavioral: The patient is not nervous/anxious.   All other systems reviewed and are negative.     Physical Exam: Physical Exam  Constitutional: She is oriented to person, place, and time. She appears well-developed and well-nourished. No distress.  Morbid obesity   HENT:  Head: Normocephalic and atraumatic.  Eyes: Pupils are equal, round, and reactive to light. Conjunctivae are normal.  Neck: No JVD present.  Cardiovascular: Normal rate, regular rhythm and intact distal pulses.  Pulmonary/Chest: Effort normal and breath sounds normal. She has no wheezes. She has no rales.  Abdominal: Soft. Bowel sounds are normal. There is no rebound.  Musculoskeletal:        General: Edema (2+ b/l) present.  Lymphadenopathy:    She has no cervical adenopathy.  Neurological: She is alert and oriented to person, place, and time. No cranial nerve deficit.  Appears somnolent  Skin: Skin is warm and dry.  Psychiatric: She has a normal mood and affect.  Nursing note and vitals reviewed.    Labs:   Lab Results  Component Value Date   WBC 5.4 11/08/2019   HGB 10.0 (L) 11/08/2019   HCT 34.4 (L) 11/08/2019   MCV 93.5 11/08/2019   PLT 196 11/08/2019    Recent Labs  Lab 11/08/19 0459  NA 133*  K 5.4*  CL 98  CO2 24  BUN 76*  CREATININE 3.59*  CALCIUM 8.9  PROT 9.0*  BILITOT 1.3*  ALKPHOS 83  ALT 16  AST 21  GLUCOSE 205*    Lipid Panel     Component Value Date/Time   CHOL 101 03/05/2011 0545   TRIG 60 03/05/2011 0545   HDL 29 (L) 03/05/2011 0545   CHOLHDL 3.5 03/05/2011 0545   VLDL 12 03/05/2011 0545   LDLCALC 60 03/05/2011 0545    BNP (last 3 results) Recent Labs    09/10/19 2341 10/15/19 2252 11/07/19 2145  BNP 313.1* 562.8* 584.6*    HEMOGLOBIN A1C Lab Results  Component Value Date   HGBA1C 8.8 (H) 09/11/2019   MPG 205.86 09/11/2019      Lab Results  Component Value Date   CKTOTAL 59 10/02/2008   CKMB 1.1 10/02/2008       Radiology: DG Chest 2 View  Result Date: 11/07/2019 CLINICAL DATA:  54 year old female with  shortness of breath and chest pain. EXAM: CHEST - 2 VIEW COMPARISON:  Chest radiograph dated 10/20/2019. FINDINGS: There is cardiomegaly with vascular congestion. No focal consolidation, pleural effusion, pneumothorax. No acute osseous pathology. Cervical ACDF. IMPRESSION: Cardiomegaly with vascular congestion, progressed since the prior radiograph. No focal consolidation. Electronically Signed   By: Anner Crete M.D.   On: 11/07/2019 22:06   CT CERVICAL SPINE WO CONTRAST  Result Date: 11/08/2019 CLINICAL DATA:  Neck and low back pain after a fall 2 weeks ago. EXAM: CT CERVICAL SPINE WITHOUT CONTRAST TECHNIQUE: Multidetector CT imaging of the cervical spine was performed without intravenous contrast. Multiplanar CT image reconstructions were also generated. COMPARISON:  10/20/2019 FINDINGS: Alignment: Chronic cervical spine straightening. No acute traumatic subluxation. Skull base and vertebrae: No acute fracture or destructive osseous process. Previous C5 and C6 corpectomy and anterior fusion from C4-C7 with solid osseous fusion. Soft tissues and spinal canal: No prevertebral fluid or swelling. Poor assessment of the spinal canal due to large body habitus. Disc levels: C4-C7 anterior fusion. Bulky anterior vertebral spurring at C3. Moderate to severe disc space narrowing at C7-T1. Moderate multilevel facet arthrosis. Moderate bilateral neural foraminal stenosis at C3-4 due to uncovertebral and facet spurring. Severe left neural foraminal stenosis at C4-5. Upper chest: Clear lung apices. Other: None. IMPRESSION: 1. No acute osseous abnormality identified in the cervical spine. 2. Solid C4-C7 anterior fusion. Electronically Signed   By: Logan Bores M.D.   On: 11/08/2019 09:26   CT LUMBAR SPINE WO CONTRAST  Result Date: 11/08/2019 CLINICAL DATA:  Neck and low back pain after a fall 2 weeks ago. EXAM: CT  LUMBAR SPINE WITHOUT CONTRAST TECHNIQUE: Multidetector CT imaging of the lumbar spine was performed without intravenous contrast administration. Multiplanar CT image reconstructions were also generated. COMPARISON:  Lumbar spine MRI 02/03/2010 FINDINGS: Image quality is degraded by large body habitus. Segmentation: 5 lumbar type vertebrae. Alignment: Mild lumbar levoscoliosis. Chronic facet mediated anterolisthesis of L4 on L5 measuring 4 mm. Vertebrae: No acute fracture or destructive osseous process. Paraspinal and other soft tissues: No acute finding. Disc levels: T10-11: Spondylosis and mild right and severe left facet arthrosis result in moderate to severe left neural foraminal stenosis without gross spinal stenosis, similar to the prior MRI. T11-12: Spondylosis and moderate facet arthrosis without gross stenosis, similar to the prior MRI. T12-L1: Mild-to-moderate facet hypertrophy without gross stenosis, similar to the prior MRI. L1-2: Moderate facet hypertrophy without gross stenosis, similar to the prior MRI. L2-3: Mild disc bulging and severe facet hypertrophy without gross stenosis, similar to the prior MRI. L3-4: Mild disc space narrowing and vacuum disc. Disc bulging and severe right greater than left facet hypertrophy result in chronic severe spinal stenosis and severe right neural foraminal stenosis, similar to the prior MRI. L4-5: Mild disc space narrowing and vacuum disc. Anterolisthesis with bulging uncovered disc and severe right greater than left facet hypertrophy result in at least moderate chronic spinal stenosis and mild-to-moderate bilateral neural foraminal stenosis, similar to the prior MRI. L5-S1: Severe asymmetric left-sided disc space narrowing, mildly progressed. Disc bulging and endplate spurring asymmetric to the left, asymmetric left-sided disc space height loss, and severe facet hypertrophy result in mild right and severe left neural foraminal stenosis. Poor assessment for a  persistent left subarticular disc protrusion left lateral recess stenosis shown on the prior MRI. IMPRESSION: 1. No acute osseous abnormality identified in the lumbar spine. 2. Mid and lower lumbar disc degeneration and advanced facet arthrosis with chronic severe spinal stenosis  at L3-4 and at least moderate spinal stenosis at L4-5. 3. Severe left neural foraminal stenosis at L5-S1. Electronically Signed   By: Logan Bores M.D.   On: 11/08/2019 09:20    Scheduled Meds: . allopurinol  300 mg Oral Daily  . amLODipine  5 mg Oral Daily  . [START ON 11/12/2019] buprenorphine  1 patch Transdermal Q Sat  . heparin  5,000 Units Subcutaneous Q8H  . insulin aspart  0-6 Units Subcutaneous TID WC  . isosorbide-hydrALAZINE  2 tablet Oral TID  . metoprolol succinate  25 mg Oral Daily  . oxyCODONE  20 mg Oral Q12H   Continuous Infusions: . cefTRIAXone (ROCEPHIN)  IV 1 g (11/08/19 0820)   PRN Meds:.acetaminophen, ALPRAZolam, oxyCODONE-acetaminophen, polyethylene glycol, senna-docusate  CARDIAC STUDIES:  EKG 11/07/2019: Sinus rhythm. Low voltage. Right axis deviation.  Echocardiogram 09/12/2019: LVEF 35-40%. Global hypokinesis. Grade 1 DD. Limited evaluation due to poor acoustic windows.  Assessment & Recommendations:  54 year old African-American female with hypertension, type II diabetes mellitus, tobacco abuse, morbid obesity BMI 67, heart failure with reduced ejection fraction (likely nonischemic cardiomyopathy), neagtive nuclear stress test 01/2018.  Acute on chronic systolic heart failure: Nonishcemic cardiomyopathy/ Not hypoxic on 2 L O2. 2+ pitting edema. Elevated BNP. Recommend lasix 40 mg bid for now.  She will need home health for heart failure management to prevent frequent readmissions.   Troponin elevation: Supply demand mismatch due to HF exacerbation. Not MI.  AKI/CKD: Suspect cardiorenal syndrome. I anticipate Cr may improve with aggressive diuresis.  If no improvement,  consider nephrology evaluation.  Tremors/somnolescne: Initial reason for her ED visit. Unclear etiology. Defer workup to the primary team  Nigel Mormon, MD 11/08/2019, 9:56 AM [facility] [place] Pager: [phone] Office: [phone] If no answer Cell [phone]

## 2019-11-08 NOTE — Progress Notes (Signed)
Foley was placed successfully @1715 , urine returned but not much.

## 2019-11-08 NOTE — Plan of Care (Signed)

## 2019-11-08 NOTE — Progress Notes (Addendum)
Attempts to get bladder scan done is unsuccessful. Bladder scan machine is unable to detect pt' bladder. Notified Dr Reesa Chew.

## 2019-11-08 NOTE — Progress Notes (Signed)
Unsuccessful attempts to place foley on patient due to body habitus and difficult anatomy. Notified Dr Reesa Chew. Will let patient rest and try again later today.

## 2019-11-09 LAB — MAGNESIUM: Magnesium: 2.3 mg/dL (ref 1.7–2.4)

## 2019-11-09 LAB — CBC
HCT: 33.4 % — ABNORMAL LOW (ref 36.0–46.0)
Hemoglobin: 9.7 g/dL — ABNORMAL LOW (ref 12.0–15.0)
MCH: 27.2 pg (ref 26.0–34.0)
MCHC: 29 g/dL — ABNORMAL LOW (ref 30.0–36.0)
MCV: 93.8 fL (ref 80.0–100.0)
Platelets: 206 10*3/uL (ref 150–400)
RBC: 3.56 MIL/uL — ABNORMAL LOW (ref 3.87–5.11)
RDW: 16.4 % — ABNORMAL HIGH (ref 11.5–15.5)
WBC: 5.4 10*3/uL (ref 4.0–10.5)
nRBC: 0.4 % — ABNORMAL HIGH (ref 0.0–0.2)

## 2019-11-09 LAB — URINE CULTURE

## 2019-11-09 LAB — GLUCOSE, CAPILLARY
Glucose-Capillary: 173 mg/dL — ABNORMAL HIGH (ref 70–99)
Glucose-Capillary: 178 mg/dL — ABNORMAL HIGH (ref 70–99)
Glucose-Capillary: 172 mg/dL — ABNORMAL HIGH (ref 70–99)
Glucose-Capillary: 194 mg/dL — ABNORMAL HIGH (ref 70–99)

## 2019-11-09 LAB — BASIC METABOLIC PANEL
Anion gap: 12 (ref 5–15)
BUN: 80 mg/dL — ABNORMAL HIGH (ref 6–20)
CO2: 24 mmol/L (ref 22–32)
Calcium: 8.9 mg/dL (ref 8.9–10.3)
Chloride: 98 mmol/L (ref 98–111)
Creatinine, Ser: 3.76 mg/dL — ABNORMAL HIGH (ref 0.44–1.00)
GFR calc Af Amer: 15 mL/min — ABNORMAL LOW (ref >60)
GFR calc non Af Amer: 13 mL/min — ABNORMAL LOW (ref >60)
Glucose, Bld: 183 mg/dL — ABNORMAL HIGH (ref 70–99)
Potassium: 5.9 mmol/L — ABNORMAL HIGH (ref 3.5–5.1)
Sodium: 134 mmol/L — ABNORMAL LOW (ref 135–145)

## 2019-11-09 MED ORDER — FUROSEMIDE 10 MG/ML IJ SOLN
120.0000 mg | Freq: Three times a day (TID) | INTRAVENOUS | Status: DC
  Administered 2019-11-09 (×2): 120 mg via INTRAVENOUS
  Filled 2019-11-09 (×6): qty 12

## 2019-11-09 MED ORDER — PATIROMER SORBITEX CALCIUM 8.4 G PO PACK
16.8000 g | PACK | Freq: Every day | ORAL | Status: DC
  Administered 2019-11-09 – 2019-11-11 (×3): 16.8 g via ORAL
  Filled 2019-11-09 (×5): qty 2

## 2019-11-09 MED ORDER — CALCIUM GLUCONATE-NACL 1-0.675 GM/50ML-% IV SOLN
1.0000 g | Freq: Once | INTRAVENOUS | Status: AC
  Administered 2019-11-09: 1000 mg via INTRAVENOUS
  Filled 2019-11-09: qty 50

## 2019-11-09 MED ORDER — SODIUM ZIRCONIUM CYCLOSILICATE 10 G PO PACK
10.0000 g | PACK | Freq: Two times a day (BID) | ORAL | Status: DC
  Administered 2019-11-09: 10 g via ORAL
  Filled 2019-11-09: qty 1

## 2019-11-09 MED ORDER — SODIUM BICARBONATE 8.4 % IV SOLN
50.0000 meq | Freq: Once | INTRAVENOUS | Status: AC
  Administered 2019-11-09: 50 meq via INTRAVENOUS
  Filled 2019-11-09: qty 50

## 2019-11-09 NOTE — Progress Notes (Signed)
Foley draining dark amber urine in small amounts.  Foley leaking small amts of urine as well.  Patient's thighs cutting off outflow, pinching off foley catheter.  Close monitoring is required.

## 2019-11-09 NOTE — Progress Notes (Signed)
PROGRESS NOTE    [name]  UVO:[id number] DOB: 22-Jan-1966 DOA: 11/07/2019 PCP: Vonna Drafts, FNP  Brief Narrative:  54 year old lady with prior history of chronic systolic heart failure, diabetes mellitus, morbid obesity, chronic kidney disease was brought in for bilateral lower extremity edema and worsening back pain due to a fall from the stretcher 2 weeks ago.  On arrival to ED she was found to be in acute kidney injury and acute on chronic systolic heart failure.  Assessment & Plan:   Principal Problem:   ARF (acute renal failure) (HCC) Active Problems:   Essential hypertension   Diabetes mellitus ([place])   Acute on chronic systolic congestive heart failure (HCC)   CKD (chronic kidney disease), stage III   Hyperkalemia   Acute on chronic stage IV kidney disease with hyperkalemia, oliguric CT of the abdomen ruled out obstruction.  Probably cardiorenal syndrome from CHF. Aggressive diuresis with Lasix 120 mg 3 times daily as per nephrology. [place] for hyperkalemia and repeat renal parameters every 12 hours.  Patient is at high risk for dialysis. Avoid nephrotoxins and monitor renal parameters and urine output.    Acute on chronic systolic heart failure Last echo showed ejection fraction of 35 to 40%. Cardiology on board and appreciate recommendations. Aggressive diuresis. Daily weights and strict intake and output. Continue with beta Toprol.    Myoclonic jerks with some tremors possibly secondary to uremia.  Gabapentin on hold on admission.   Neck pain CT of the cervical spine was done on 2/23 and it shows 1. No acute osseous abnormality identified in the cervical spine. Solid C4-C7 anterior fusion.  Back pain CT of the lumbar spine on admission shows No acute osseous abnormality identified in the lumbar spine. Mid and lower lumbar disc degeneration and advanced facet arthrosis with chronic severe spinal stenosis at L3-4 and at least moderate spinal  stenosis at L4-5. Severe left neural foraminal stenosis at L5-S1. Pain control and PT evaluation.   Diabetes mellitus CBG (last 3)  Recent Labs    11/09/19 0618 11/09/19 0737 11/09/19 1127  GLUCAP 173* 178* 172*   Continue with sliding scale insulin.   Abnormal UA/UTI Urine cultures pending and patient is on Rocephin.   Essential hypertension Blood pressure parameters are well controlled.  Continue with Norvasc alone 25 mg daily, BiDil .  Pressure injury: Pressure Injury 11/12/17 Stage II -  Partial thickness loss of dermis presenting as a shallow open ulcer with a red, pink wound bed without slough. 2x1 cm,located in the lower L buttock (Active)  11/12/17 1045  Location: Buttocks  Location Orientation: Left  Staging: Stage II -  Partial thickness loss of dermis presenting as a shallow open ulcer with a red, pink wound bed without slough.  Wound Description (Comments): 2x1 cm,located in the lower L buttock  Present on Admission: Yes          DVT prophylaxis: (Heparin subcu 3 times daily Code Status: Full code Family Communication: Discussed with daughter over the phone   disposition Plan:  . Patient is from home, therapy eval is pending patient not stable for discharge yet evaluation for acute kidney injury in progress   Consultants:   Nephrology  Procedures: (CT of the abdomen without contrast CT of the cervical spine and lumbar spine without contrast  Antimicrobials: Rocephin for UTI  Subjective: Patient reports that she did not fall, appears confused but not in any kind of distress  Objective: Vitals:   11/09/19 0040 11/09/19 0617 11/09/19  1041 11/09/19 1205  BP: 116/68 121/72 132/75 108/63  Pulse: 83 81 80 81  Resp: 20 18 18 17   Temp: 98 F (36.7 C) 97.8 F (36.6 C)  98.3 F (36.8 C)  TempSrc:  Oral  Oral  SpO2: 93% 100% 94% 97%  Weight:  (!) 215.9 kg    Height:        Intake/Output Summary (Last 24 hours) at 11/09/2019 1449 Last data filed  at 11/09/2019 5400 Gross per 24 hour  Intake 240 ml  Output 150 ml  Net 90 ml   Filed Weights   11/08/19 0116 11/09/19 0617  Weight: (!) 217.7 kg (!) 215.9 kg    Examination:  General exam: Appears calm and comfortable  Respiratory system: Diminished air entry at bases, some tachypnea present Cardiovascular system: S1 & S2 heard, RRR.  Pedal edema present Gastrointestinal system: Abdomen is nondistended, soft and nontender. . Normal bowel sounds heard. Central nervous system: Alert and oriented to person only, confused Extremities: Leg edema present Skin: No rashes Psychiatry: Flat affect    Data Reviewed: I have personally reviewed following labs and imaging studies  CBC: Recent Labs  Lab 11/07/19 2145 11/08/19 0459 11/09/19 0408  WBC 5.8 5.4 5.4  NEUTROABS 4.6 4.1  --   HGB 10.3* 10.0* 9.7*  HCT 35.6* 34.4* 33.4*  MCV 95.4 93.5 93.8  PLT 214 196 867   Basic Metabolic Panel: Recent Labs  Lab 11/07/19 2145 11/08/19 0459 11/09/19 0408  NA 133* 133* 134*  K 5.8* 5.4* 5.9*  CL 98 98 98  CO2 23 24 24   GLUCOSE 240* 205* 183*  BUN 77* 76* 80*  CREATININE 3.57* 3.59* 3.76*  CALCIUM 8.9 8.9 8.9  MG  --   --  2.3   GFR: Estimated Creatinine Clearance: 32.5 mL/min (A) (by C-G formula based on SCr of 3.76 mg/dL (H)). Liver Function Tests: Recent Labs  Lab 11/07/19 2145 11/08/19 0459  AST 23 21  ALT 15 16  ALKPHOS 82 83  BILITOT 1.4* 1.3*  PROT 9.3* 9.0*  ALBUMIN 3.6 3.6   No results for input(s): LIPASE, AMYLASE in the last 168 hours. No results for input(s): AMMONIA in the last 168 hours. Coagulation Profile: No results for input(s): INR, PROTIME in the last 168 hours. Cardiac Enzymes: No results for input(s): CKTOTAL, CKMB, CKMBINDEX, TROPONINI in the last 168 hours. BNP (last 3 results) No results for input(s): PROBNP in the last 8760 hours. HbA1C: No results for input(s): HGBA1C in the last 72 hours. CBG: Recent Labs  Lab 11/08/19 1635  11/08/19 2135 11/09/19 0618 11/09/19 0737 11/09/19 1127  GLUCAP 164* 167* 173* 178* 172*   Lipid Profile: No results for input(s): CHOL, HDL, LDLCALC, TRIG, CHOLHDL, LDLDIRECT in the last 72 hours. Thyroid Function Tests: No results for input(s): TSH, T4TOTAL, FREET4, T3FREE, THYROIDAB in the last 72 hours. Anemia Panel: No results for input(s): VITAMINB12, FOLATE, FERRITIN, TIBC, IRON, RETICCTPCT in the last 72 hours. Sepsis Labs: No results for input(s): PROCALCITON, LATICACIDVEN in the last 168 hours.  Recent Results (from the past 240 hour(s))  SARS CORONAVIRUS 2 (TAT 6-24 HRS) Nasopharyngeal Nasopharyngeal Swab     Status: None   Collection Time: 11/07/19 11:10 PM   Specimen: Nasopharyngeal Swab  Result Value Ref Range Status   SARS Coronavirus 2 NEGATIVE NEGATIVE Final    Comment: (NOTE) SARS-CoV-2 target nucleic acids are NOT DETECTED. The SARS-CoV-2 RNA is generally detectable in upper and lower respiratory specimens during the acute phase of  infection. Negative results do not preclude SARS-CoV-2 infection, do not rule out co-infections with other pathogens, and should not be used as the sole basis for treatment or other patient management decisions. Negative results must be combined with clinical observations, patient history, and epidemiological information. The expected result is Negative. Fact Sheet for Patients: SugarRoll.be Fact Sheet for Healthcare Providers: https://www.woods-mathews.com/ This test is not yet approved or cleared by the [place] FDA and  has been authorized for detection and/or diagnosis of SARS-CoV-2 by FDA under an Emergency Use Authorization (EUA). This EUA will remain  in effect (meaning this test can be used) for the duration of the COVID-19 declaration under Section 56 4(b)(1) of the Act, 21 U.S.C. section 360bbb-3(b)(1), unless the authorization is terminated or revoked sooner. Performed at  [facility], [place] [address] [place], [place] [address]   Culture, Urine     Status: Abnormal   Collection Time: 11/08/19  7:36 AM   Specimen: Urine, Random  Result Value Ref Range Status   Specimen Description URINE, RANDOM  Final   Special Requests   Final    NONE Performed at [facility], [place] [address], [place], [place] [address]    Culture MULTIPLE SPECIES PRESENT, SUGGEST RECOLLECTION (A)  Final   Report Status 11/09/2019 FINAL  Final         Radiology Studies: CT ABDOMEN PELVIS WO CONTRAST  Result Date: 11/08/2019 CLINICAL DATA:  [place] 2 weeks ago. Abdominal and back pain. Labored breathing. EXAM: CT ABDOMEN AND PELVIS WITHOUT CONTRAST TECHNIQUE: Multidetector CT imaging of the abdomen and pelvis was performed following the standard protocol without IV contrast. COMPARISON:  None. FINDINGS: Extremely limited examination due to body habitus. The patient's skin is touching the gantry and creating significant artifact. Study also limited by patient motion and lack of IV contrast. Lower chest: Breathing motion artifact. No obvious pulmonary infiltrates or effusions. The heart is mildly enlarged. No pericardial effusion. Hepatobiliary: No gross abnormalities identified. The gallbladder is surgically absent. Pancreas: No gross abnormality. Spleen: Within normal limits in size. No obvious lesions. Adrenals/Urinary Tract: Difficult to evaluate the adrenal glands. Both kidneys are grossly normal. No renal calculi or hydronephrosis. Limited examination of the bladder. Stomach/Bowel: The stomach, duodenum, small bowel and colon are grossly normal but examination is quite limited. No findings for small bowel obstruction or free air. Vascular/Lymphatic: The aorta is normal in caliber. No obvious atherosclerotic calcifications or aneurysm. Reproductive: I believe the uterus and ovaries are normal. Other: No pelvic mass or free pelvic fluid collections. Diffuse body wall edema.  Musculoskeletal: No gross bony abnormalities but very limited examination. Advanced bilateral hip joint degenerative changes. IMPRESSION: 1. Very limited examination due to body habitus, motion artifact and lack of IV contrast. 2. No obvious/gross acute abdominal/pelvic findings, mass lesions or adenopathy. 3. Diffuse body wall edema. 4. Advanced bilateral hip joint degenerative changes. Electronically Signed   By: Marijo Sanes M.D.   On: 11/08/2019 16:30   DG Chest 2 View  Result Date: 11/07/2019 CLINICAL DATA:  54 year old female with shortness of breath and chest pain. EXAM: CHEST - 2 VIEW COMPARISON:  Chest radiograph dated 10/20/2019. FINDINGS: There is cardiomegaly with vascular congestion. No focal consolidation, pleural effusion, pneumothorax. No acute osseous pathology. Cervical ACDF. IMPRESSION: Cardiomegaly with vascular congestion, progressed since the prior radiograph. No focal consolidation. Electronically Signed   By: Anner Crete M.D.   On: 11/07/2019 22:06   CT CERVICAL SPINE WO CONTRAST  Result Date: 11/08/2019  CLINICAL DATA:  Neck and low back pain after a fall 2 weeks ago. EXAM: CT CERVICAL SPINE WITHOUT CONTRAST TECHNIQUE: Multidetector CT imaging of the cervical spine was performed without intravenous contrast. Multiplanar CT image reconstructions were also generated. COMPARISON:  10/20/2019 FINDINGS: Alignment: Chronic cervical spine straightening. No acute traumatic subluxation. Skull base and vertebrae: No acute fracture or destructive osseous process. Previous C5 and C6 corpectomy and anterior fusion from C4-C7 with solid osseous fusion. Soft tissues and spinal canal: No prevertebral fluid or swelling. Poor assessment of the spinal canal due to large body habitus. Disc levels: C4-C7 anterior fusion. Bulky anterior vertebral spurring at C3. Moderate to severe disc space narrowing at C7-T1. Moderate multilevel facet arthrosis. Moderate bilateral neural foraminal stenosis at C3-4  due to uncovertebral and facet spurring. Severe left neural foraminal stenosis at C4-5. Upper chest: Clear lung apices. Other: None. IMPRESSION: 1. No acute osseous abnormality identified in the cervical spine. 2. Solid C4-C7 anterior fusion. Electronically Signed   By: Logan Bores M.D.   On: 11/08/2019 09:26   CT LUMBAR SPINE WO CONTRAST  Result Date: 11/08/2019 CLINICAL DATA:  Neck and low back pain after a fall 2 weeks ago. EXAM: CT LUMBAR SPINE WITHOUT CONTRAST TECHNIQUE: Multidetector CT imaging of the lumbar spine was performed without intravenous contrast administration. Multiplanar CT image reconstructions were also generated. COMPARISON:  Lumbar spine MRI 02/03/2010 FINDINGS: Image quality is degraded by large body habitus. Segmentation: 5 lumbar type vertebrae. Alignment: Mild lumbar levoscoliosis. Chronic facet mediated anterolisthesis of L4 on L5 measuring 4 mm. Vertebrae: No acute fracture or destructive osseous process. Paraspinal and other soft tissues: No acute finding. Disc levels: T10-11: Spondylosis and mild right and severe left facet arthrosis result in moderate to severe left neural foraminal stenosis without gross spinal stenosis, similar to the prior MRI. T11-12: Spondylosis and moderate facet arthrosis without gross stenosis, similar to the prior MRI. T12-L1: Mild-to-moderate facet hypertrophy without gross stenosis, similar to the prior MRI. L1-2: Moderate facet hypertrophy without gross stenosis, similar to the prior MRI. L2-3: Mild disc bulging and severe facet hypertrophy without gross stenosis, similar to the prior MRI. L3-4: Mild disc space narrowing and vacuum disc. Disc bulging and severe right greater than left facet hypertrophy result in chronic severe spinal stenosis and severe right neural foraminal stenosis, similar to the prior MRI. L4-5: Mild disc space narrowing and vacuum disc. Anterolisthesis with bulging uncovered disc and severe right greater than left facet  hypertrophy result in at least moderate chronic spinal stenosis and mild-to-moderate bilateral neural foraminal stenosis, similar to the prior MRI. L5-S1: Severe asymmetric left-sided disc space narrowing, mildly progressed. Disc bulging and endplate spurring asymmetric to the left, asymmetric left-sided disc space height loss, and severe facet hypertrophy result in mild right and severe left neural foraminal stenosis. Poor assessment for a persistent left subarticular disc protrusion left lateral recess stenosis shown on the prior MRI. IMPRESSION: 1. No acute osseous abnormality identified in the lumbar spine. 2. Mid and lower lumbar disc degeneration and advanced facet arthrosis with chronic severe spinal stenosis at L3-4 and at least moderate spinal stenosis at L4-5. 3. Severe left neural foraminal stenosis at L5-S1. Electronically Signed   By: Logan Bores M.D.   On: 11/08/2019 09:20   US RENAL  Result Date: 11/08/2019 CLINICAL DATA:  Acute renal injury. EXAM: RENAL / URINARY TRACT ULTRASOUND COMPLETE COMPARISON:  No prior. FINDINGS: Right Kidney: Renal measurements: 9.9 x 5.0 x 5.9 cm = volume: 149.9 mL . Echogenicity  within normal limits. No mass or hydronephrosis visualized. Lower pole not visualized due to overlying bowel gas. Left Kidney: Renal measurements: Not visualized Bladder: Bladder is nondistended. Other: Limited exam due to patient's body habitus and bowel gas. IMPRESSION: Limited exam due to patient's body habitus and bowel gas. Left kidney not visualized. Lower pole right kidney not visualized. No acute or focal abnormality. No evidence of right hydronephrosis. Electronically Signed   By: [place]   On: 11/08/2019 10:45        Scheduled Meds: . allopurinol  300 mg Oral Daily  . amLODipine  5 mg Oral Daily  . [START ON 11/12/2019] buprenorphine  1 patch Transdermal Q Sat  . Chlorhexidine Gluconate Cloth  6 each Topical Daily  . heparin  5,000 Units Subcutaneous Q8H  .  insulin aspart  0-6 Units Subcutaneous TID WC  . isosorbide-hydrALAZINE  2 tablet Oral TID  . metoprolol succinate  25 mg Oral Daily  . oxyCODONE  20 mg Oral Q12H  . patiromer  16.8 g Oral Daily   Continuous Infusions: . cefTRIAXone (ROCEPHIN)  IV 1 g (11/09/19 0856)  . furosemide       LOS: 2 days        Hosie Poisson, MD [facility]   To contact the attending provider between 7A-7P or the covering provider during after hours 7P-7A, please log into the web site [email or web address] and access using universal [place] password for that web site. If you do not have the password, please call the hospital operator.  11/09/2019, 2:49 PM

## 2019-11-09 NOTE — Progress Notes (Signed)
Remains oliguric, with Cr 3.76, K 5.9. Consider nephrology consult.  Nigel Mormon, MD

## 2019-11-09 NOTE — Consult Note (Signed)
[name] Admit Date: 12-05-19 11/09/2019 Rexene Agent Requesting Physician:  Karleen Hampshire MD  Reason for Consult:  AoCKD3 HPI:  43F with extensive past medical history of morbid obesity, chronic systolic heart failure with recent exacerbation earlier this year, type 2 diabetes, very limited mobility secondary to habitus and OA/malfunction of left TKR, OSA, chronic pain on narcotics who was admitted on 2/23 presenting with intractable lumbar and cervical neck pain following a fall, increased myoclonus, and progressive peripheral edema.  Systolic heart failure due to nonischemic cardiomyopathy, she was admitted in January with decompensated heart failure, twice.  She has had progressive decline in renal function over the past 3 years, most recently with a creatinine between 1.8 and 2.0.  Presenting creatinine was 3.6, 3.76 today.  This has been complicated by hyperkalemia, currently requiring Lokelma 10 g twice daily.  Today's value is 5.9.  She is on Lasix 40 mg IV twice daily with very little urine output to date, she has a Foley catheter.  She is unable to obtain daily weights.  She does note significant progressive edema throughout the legs, and across the abdomen.  UA at presentation with 11-20 erythrocytes per high-powered field, significant pyuria, greater than 50 per high-power field, 2+ proteinuria.  Her urine sodium is less than 10.  CT of the abdomen and pelvis yesterday without evidence of hydronephrosis or obstruction.  She denies use of NSAIDs preceding admission.  No exposure to TMP/SMX.  No IV contrast.  She appears to be 7 to 10 kg above her preceding weights, if accurate.  Most recent TTE was 09/12/2019 and had an LVEF of 35 to 40%, mild LVH; grade 1 diastolic dysfunction. the exam was very limited.    Her main concern currently is her ongoing chronic pain.  It is unclear if she has exertional symptoms.  Her mother was a dialysis patient locally, who died in  04-Dec-2017.  Creatinine (mg/dL)  Date Value  05/15/2014 0.74  05/14/2014 0.82   Creatinine, Ser (mg/dL)  Date Value  11/09/2019 3.76 (H)  11/08/2019 3.59 (H)  2019-12-05 3.57 (H)  10/20/2019 1.98 (H)  10/16/2019 1.76 (H)  10/15/2019 1.77 (H)  09/19/2019 1.98 (H)  09/18/2019 1.99 (H)  09/17/2019 2.23 (H)  09/16/2019 2.01 (H)  ] I/Os: I/O last 3 completed shifts: In: 600 [P.O.:600] Out: 270 [Urine:270]   ROS Balance of 12 systems is negative w/ exceptions as above  PMH  Past Medical History:  Diagnosis Date  . Arthritis    "hands, arms, feet, back, knees" (02/17/2018)  . Cardiomyopathy [facility])    From Dr. Bonney Roussel office notes from 01/13/18  . CHF (congestive heart failure) ([place])   . Chronic lower back pain   . Diabetic neuropathy ([place])   . Family history of adverse reaction to anesthesia    Mother is hard to arouse and blood pressure drops  . GERD (gastroesophageal reflux disease)   . High cholesterol   . Hypertension   . Sleep apnea    "need new machine"  - does not use a cpap (02/17/2018)  . Type II diabetes mellitus ([place])    no longer on medications (02/17/2018)   [place]  Past Surgical History:  Procedure Laterality Date  . ANTERIOR CERVICAL DECOMP/DISCECTOMY FUSION  03/06/2010   C4-7 w/corpectomy/notes 03/17/2010  . BACK SURGERY    . CARPAL TUNNEL RELEASE Right   . HARDWARE REVISION  04/10/2010   cervical hardware revision screw replacement C4/notes 04/12/2010  . JOINT REPLACEMENT    .  LAPAROSCOPIC CHOLECYSTECTOMY  1999  . LUMBAR LAMINECTOMY/DECOMPRESSION MICRODISCECTOMY  09/2010   Archie Endo 10/05/2010  . MULTIPLE EXTRACTIONS WITH ALVEOLOPLASTY [name] 01/19/2015   Procedure: MULTIPLE EXTRACTIONS Three, Four, Five, Seven, eight, nine, ten, eleven, twelve, fourteen, eighteen, twenty-one, twenty-eight, twenty-nine, thirty-one with Alveoloplasty.  ;  Surgeon: Diona Browner, DDS;  Location: [place];  Service: Oral Surgery;  Laterality: [name];  . REPLACEMENT TOTAL KNEE BILATERAL Bilateral  10/2007-03/2008   "left-right"  . TOENAIL EXCISION Bilateral    great toes  . TONSILLECTOMY    . TOTAL KNEE REVISION Right 02/17/2018  . TOTAL KNEE REVISION Right 02/17/2018   Procedure: RIGHT TOTAL KNEE REVISION;  Surgeon: Newt Minion, MD;  Location: [place];  Service: Orthopedics;  Laterality: Right;  . TOTAL KNEE REVISION Right 05/14/2018   Procedure: RIGHT TOTAL KNEE REVISION TO HINGED KNEE;  Surgeon: Newt Minion, MD;  Location: [place];  Service: Orthopedics;  Laterality: Right;   FH  Family History  Problem Relation Age of Onset  . Kidney disease Mother   . Congestive Heart Failure Mother   . Hypertension Father    SH  reports that she has been smoking cigarettes. She has a 16.00 pack-year smoking history. She has never used smokeless tobacco. She reports previous alcohol use. She reports previous drug use. Drug: Marijuana. Allergies No Known Allergies Home medications Prior to Admission medications   Medication Sig Start Date End Date Taking? Authorizing Provider  albuterol (PROVENTIL) (2.5 MG/3ML) 0.083% nebulizer solution Take 2.5 mg by nebulization every 4 (four) hours as needed for wheezing.  08/22/19  Yes [provider]  allopurinol (ZYLOPRIM) 300 MG tablet Take 300 mg by mouth daily.  02/03/19  Yes [provider]  ALPRAZolam (XANAX) 0.25 MG tablet Take 0.25 mg by mouth 2 (two) times daily as needed for anxiety.  02/03/19  Yes [provider]  amLODipine (NORVASC) 5 MG tablet Take 1 tablet (5 mg total) by mouth daily. 10/24/19 01/22/20 Yes Miquel Dunn, NP  buprenorphine Haze Rushing) 20 MCG/HR PTWK patch Place 1 patch onto the skin every Saturday.  08/23/19  Yes [provider]  furosemide (LASIX) 40 MG tablet Take 1 tablet (40 mg total) by mouth 2 (two) times daily. 09/19/19 09/18/20 Yes Vann, Jessica U, DO  gabapentin (NEURONTIN) 100 MG capsule Take 100 mg by mouth at bedtime as needed (pain).  08/22/19  Yes [provider]   isosorbide-hydrALAZINE (BIDIL) 20-37.5 MG tablet Take 2 tablets by mouth 3 (three) times daily. 09/26/19  Yes Miquel Dunn, NP  linagliptin (TRADJENTA) 5 MG TABS tablet Take 1 tablet (5 mg total) by mouth daily. 09/20/19  Yes Vann, Jessica U, DO  metoprolol succinate (TOPROL-XL) 25 MG 24 hr tablet TAKE 1 TABLET(25 MG) BY MOUTH DAILY WITH OR IMMEDIATELY FOLLOWING A MEAL Patient taking differently: Take 25 mg by mouth daily.  10/21/19  Yes Miquel Dunn, NP   [facility] 4 MG/0.1ML LIQD nasal spray kit Place 0.4 mg into the nose as needed (for overdose).  12/28/18  Yes [provider]  oxyCODONE-acetaminophen (PERCOCET) 10-325 MG tablet Take 1 tablet by mouth every 8 (eight) hours as needed for pain. Patient taking differently: Take 1 tablet by mouth in the morning, at noon, in the evening, and at bedtime.  09/30/18  Yes Rayburn, Neta Mends, PA-C  OZEMPIC, 0.25 OR 0.5 MG/DOSE, 2 MG/1.5ML SOPN Inject 0.25 mg into the skin every Wednesday. 10/26/19  Yes [provider]  Vitamin D, Ergocalciferol, (DRISDOL) 1.25 MG (50000 UT)  CAPS capsule Take 50,000 Units by mouth every Tuesday.  07/26/19  Yes [provider]  XTAMPZA ER 36 MG C12A Take 36 mg by mouth 2 (two) times daily.  08/25/19  Yes [provider]    Current Medications Scheduled Meds: . allopurinol  300 mg Oral Daily  . amLODipine  5 mg Oral Daily  . [START ON 11/12/2019] buprenorphine  1 patch Transdermal Q Sat  . Chlorhexidine Gluconate Cloth  6 each Topical Daily  . furosemide  40 mg Intravenous BID  . heparin  5,000 Units Subcutaneous Q8H  . insulin aspart  0-6 Units Subcutaneous TID WC  . isosorbide-hydrALAZINE  2 tablet Oral TID  . metoprolol succinate  25 mg Oral Daily  . oxyCODONE  20 mg Oral Q12H  . sodium zirconium cyclosilicate  10 g Oral BID   Continuous Infusions: . calcium gluconate 1,000 mg (11/09/19 1233)  . cefTRIAXone (ROCEPHIN)  IV 1 g (11/09/19 0856)   PRN  Meds:.acetaminophen, ALPRAZolam, oxyCODONE-acetaminophen, polyethylene glycol, senna-docusate  CBC Recent Labs  Lab 11/07/19 2145 11/08/19 0459 11/09/19 0408  WBC 5.8 5.4 5.4  NEUTROABS 4.6 4.1  --   HGB 10.3* 10.0* 9.7*  HCT 35.6* 34.4* 33.4*  MCV 95.4 93.5 93.8  PLT 214 196 276   Basic Metabolic Panel Recent Labs  Lab 11/07/19 2145 11/08/19 0459 11/09/19 0408  NA 133* 133* 134*  K 5.8* 5.4* 5.9*  CL 98 98 98  CO2 23 24 24   GLUCOSE 240* 205* 183*  BUN 77* 76* 80*  CREATININE 3.57* 3.59* 3.76*  CALCIUM 8.9 8.9 8.9    Physical Exam  Blood pressure 108/63, pulse 81, temperature 98.3 F (36.8 C), temperature source Oral, resp. rate 17, height 5' 3"  (1.6 m), weight (!) 215.9 kg, last menstrual period 11/09/2012, SpO2 97 %. GEN: Morbidly obese female, appears uncomfortable ENT: NCAT EYES: EOMI CV: Limited, regular, no appreciable PULM: Limited no obvious abnormal breath sounds, moving air bilaterally, normal work of breathing ABD: Obese, soft, nontender SKIN: No obvious rashes or lesions EXT: 4+ edema in the legs extending proximally to the hips including into the sacral region and across the anterior abdominal wall  Assessment 89F with AoCKD3, decompensated systolic CHF, morbid obesity with limited mobility, chronic pain, mild hyperkalemia  1. AoCKD3: No obstruction, no nephrotoxic exposures, absent urine sodium suggests cardiorenal syndrome which is consistent with clinical picture.   2. Hyperkalemia, mild, persistent, on Lokelma 3. Acute on chronic decompensated systolic heart failure, nonischemic 4. Super morbid obesity 5. Limited mobility 6. Chronic pain, on longstanding narcotics 7. DM2 8. OSA 9. Anemia, normocytic; features of iron deficiency 09/16/19  Plan 1. Agree with attempts to diurese, probably needing a higher dose, will increase to Lasix 120 IV 3 times daily and see how she responds 2. Lokelma probably not the best choice here given its significant  sodium load, will switch to Veltassa 3. She is at high risk of progression to dialysis dependence, which would be very difficult given her comorbidities 4. Hopefully it wont' come to that 5. Daily weights, Daily Renal Panel, Strict I/Os, Avoid nephrotoxins (NSAIDs, judicious IV Contrast)    Rexene Agent  [phone] pgr 11/09/2019, 12:48 PM

## 2019-11-09 NOTE — Consult Note (Signed)
   [facility] Inpatient Consult   11/09/2019  KINZIE WICKES 1966/04/27 [id number]      Chart reviewed for transitional needs for potential [facility].  Patient's primary care provider is Vonna Drafts, FNP at [facility], this provider is not listed as an affiliate of [place]   Reason:  Patient's primary care provider is not an in network provider at this time. Patient was made aware to change her insurance information with [employer].  Of note, [facility] Care Management services does not replace or interfere with any services that are arranged by inpatient Transition of Care [TOC] team   For additional questions or referrals please contact:    For questions, please call:  Natividad Brood, RN BSN [facility] Liaison  [phone] business mobile phone Toll free office [phone]  Fax number: [phone] [email or web address]

## 2019-11-09 NOTE — Evaluation (Signed)
Physical Therapy Evaluation Patient Details Name: [name] MRN: [id number] DOB: 05-28-1966 Today's Date: 11/09/2019   History of Present Illness  Pt is a 54 y/o female with PMH including but not limited to chronic systolic heart failure, diabetes mellitus, morbid obesity, chronic kidney disease was brought in for bilateral lower extremity edema and worsening back pain due to a fall from the stretcher 2 weeks ago.  On arrival to ED she was found to be in acute kidney injury and acute on chronic systolic heart failure.    Clinical Impression  Pt presented supine in bed with HOB elevated, awake and willing to participate in therapy session. Pt's daughter was present throughout session as well. Prior to admission, pt reported that she required assistance from two people for transfers and used a w/c primarily for mobility. She also has an aide that comes every day of the week for ~5 hrs to assist her with ADLs. Pt and family interested in pursuing SNF. At the time of evaluation, pt significantly limited secondary to fatigue, lethargy and weakness. She required max A x2 for very limited bed mobility. She would require a mechanical lift at this time for any OOB mobility. Pt would continue to benefit from skilled physical therapy services at this time while admitted and after d/c to address the below listed limitations in order to improve overall safety and independence with functional mobility.     Follow Up Recommendations SNF    Equipment Recommendations  None recommended by PT    Recommendations for Other Services       Precautions / Restrictions Precautions Precautions: Fall Precaution Comments: 476 lbs last weight Restrictions Weight Bearing Restrictions: No      Mobility  Bed Mobility Overal bed mobility: Needs Assistance Bed Mobility: Rolling Rolling: Max assist;+2 for physical assistance         General bed mobility comments: attempted to roll in bed with max A x2 and  use of bed rails; however, pt only able to minimally move (per RN and nurse tech report, it has taken 6 people to assist her with rolling since admission)  Transfers                    Ambulation/Gait                Stairs            Wheelchair Mobility    Modified Rankin (Stroke Patients Only)       Balance                                             Pertinent Vitals/Pain Pain Assessment: Faces Faces Pain Scale: Hurts little more Pain Location: bilateral LEs Pain Descriptors / Indicators: Sore Pain Intervention(s): Monitored during session;Repositioned    Home Living Family/patient expects to be discharged to:: Skilled nursing facility                 Additional Comments: daughter and pt would like to pursue SNF    Prior Function Level of Independence: Needs assistance   Gait / Transfers Assistance Needed: pt was not ambulating prior to arrival; she could perform transfers with physical assistance of two people  ADL's / Homemaking Assistance Needed: pt has an aide that comes everday for ~5 hrs who assists her with ADLs  Hand Dominance        Extremity/Trunk Assessment   Upper Extremity Assessment Upper Extremity Assessment: Difficult to assess due to impaired cognition;Generalized weakness    Lower Extremity Assessment Lower Extremity Assessment: Difficult to assess due to impaired cognition;Generalized weakness    Cervical / Trunk Assessment Cervical / Trunk Assessment: Other exceptions Cervical / Trunk Exceptions: body habitus  Communication   Communication: No difficulties  Cognition Arousal/Alertness: Lethargic Behavior During Therapy: Flat affect Overall Cognitive Status: Impaired/Different from baseline Area of Impairment: Attention;Memory;Following commands;Safety/judgement;Awareness;Problem solving                   Current Attention Level: Sustained Memory: Decreased recall of  precautions;Decreased short-term memory Following Commands: Follows one step commands inconsistently Safety/Judgement: Decreased awareness of deficits;Decreased awareness of safety Awareness: Intellectual Problem Solving: Slow processing;Decreased initiation;Difficulty sequencing;Requires verbal cues;Requires tactile cues        General Comments      Exercises     Assessment/Plan    PT Assessment Patient needs continued PT services  PT Problem List Decreased strength;Decreased range of motion;Decreased activity tolerance;Decreased balance;Decreased mobility;Decreased coordination;Decreased cognition;Decreased knowledge of use of DME;Decreased safety awareness;Decreased knowledge of precautions;Cardiopulmonary status limiting activity;Obesity       PT Treatment Interventions DME instruction;Gait training;Functional mobility training;Therapeutic activities;Therapeutic exercise;Balance training;Neuromuscular re-education;Cognitive remediation;Patient/family education    PT Goals (Current goals can be found in the Care Plan section)  Acute Rehab PT Goals Patient Stated Goal: go to rehab PT Goal Formulation: With patient/family Time For Goal Achievement: 11/23/19 Potential to Achieve Goals: Fair    Frequency Min 2X/week   Barriers to discharge        Co-evaluation               AM-PAC PT "6 Clicks" Mobility  Outcome Measure Help needed turning from your back to your side while in a flat bed without using bedrails?: Total Help needed moving from lying on your back to sitting on the side of a flat bed without using bedrails?: Total Help needed moving to and from a bed to a chair (including a wheelchair)?: Total Help needed standing up from a chair using your arms (e.g., wheelchair or bedside chair)?: Total Help needed to walk in hospital room?: Total Help needed climbing 3-5 steps with a railing? : Total 6 Click Score: 6    End of Session   Activity Tolerance: Patient  limited by fatigue;Patient limited by lethargy Patient left: in bed;with call bell/phone within reach;with family/visitor present Nurse Communication: Mobility status;Need for lift equipment PT Visit Diagnosis: Other abnormalities of gait and mobility (R26.89);Muscle weakness (generalized) (M62.81)    Time: 6945-0388 PT Time Calculation (min) (ACUTE ONLY): 32 min   Charges:   PT Evaluation $PT Eval Moderate Complexity: 1 Mod PT Treatments $Therapeutic Activity: 8-22 mins        Anastasio Champion, DPT  Acute Rehabilitation Services Pager [phone] Office [place] 11/09/2019, 4:46 PM

## 2019-11-10 LAB — RENAL FUNCTION PANEL
Albumin: 3.4 g/dL — ABNORMAL LOW (ref 3.5–5.0)
Anion gap: 16 — ABNORMAL HIGH (ref 5–15)
BUN: 91 mg/dL — ABNORMAL HIGH (ref 6–20)
CO2: 21 mmol/L — ABNORMAL LOW (ref 22–32)
Calcium: 8.9 mg/dL (ref 8.9–10.3)
Chloride: 98 mmol/L (ref 98–111)
Creatinine, Ser: 4.07 mg/dL — ABNORMAL HIGH (ref 0.44–1.00)
GFR calc Af Amer: 14 mL/min — ABNORMAL LOW (ref >60)
GFR calc non Af Amer: 12 mL/min — ABNORMAL LOW (ref >60)
Glucose, Bld: 184 mg/dL — ABNORMAL HIGH (ref 70–99)
Phosphorus: 5.8 mg/dL — ABNORMAL HIGH (ref 2.5–4.6)
Potassium: 6.6 mmol/L (ref 3.5–5.1)
Sodium: 135 mmol/L (ref 135–145)

## 2019-11-10 LAB — GLUCOSE, CAPILLARY
Glucose-Capillary: 279 mg/dL — ABNORMAL HIGH (ref 70–99)
Glucose-Capillary: 180 mg/dL — ABNORMAL HIGH (ref 70–99)
Glucose-Capillary: 178 mg/dL — ABNORMAL HIGH (ref 70–99)
Glucose-Capillary: 163 mg/dL — ABNORMAL HIGH (ref 70–99)
Glucose-Capillary: 169 mg/dL — ABNORMAL HIGH (ref 70–99)

## 2019-11-10 LAB — PROTEIN ELECTROPHORESIS, SERUM
A/G Ratio: 0.7 (ref 0.7–1.7)
Albumin ELP: 3.2 g/dL (ref 2.9–4.4)
Alpha-1-Globulin: 0.3 g/dL (ref 0.0–0.4)
Alpha-2-Globulin: 0.6 g/dL (ref 0.4–1.0)
Beta Globulin: 1.1 g/dL (ref 0.7–1.3)
Gamma Globulin: 2.8 g/dL — ABNORMAL HIGH (ref 0.4–1.8)
Globulin, Total: 4.8 g/dL — ABNORMAL HIGH (ref 2.2–3.9)
Total Protein ELP: 8 g/dL (ref 6.0–8.5)

## 2019-11-10 LAB — CBC
HCT: 31.5 % — ABNORMAL LOW (ref 36.0–46.0)
Hemoglobin: 9.5 g/dL — ABNORMAL LOW (ref 12.0–15.0)
MCH: 27.9 pg (ref 26.0–34.0)
MCHC: 30.2 g/dL (ref 30.0–36.0)
MCV: 92.4 fL (ref 80.0–100.0)
Platelets: 180 10*3/uL (ref 150–400)
RBC: 3.41 MIL/uL — ABNORMAL LOW (ref 3.87–5.11)
RDW: 16.6 % — ABNORMAL HIGH (ref 11.5–15.5)
WBC: 5.4 10*3/uL (ref 4.0–10.5)
nRBC: 0.4 % — ABNORMAL HIGH (ref 0.0–0.2)

## 2019-11-10 LAB — BASIC METABOLIC PANEL
Anion gap: 13 (ref 5–15)
Anion gap: 13 (ref 5–15)
BUN: 85 mg/dL — ABNORMAL HIGH (ref 6–20)
BUN: 87 mg/dL — ABNORMAL HIGH (ref 6–20)
CO2: 23 mmol/L (ref 22–32)
CO2: 25 mmol/L (ref 22–32)
Calcium: 8.9 mg/dL (ref 8.9–10.3)
Calcium: 9 mg/dL (ref 8.9–10.3)
Chloride: 97 mmol/L — ABNORMAL LOW (ref 98–111)
Chloride: 97 mmol/L — ABNORMAL LOW (ref 98–111)
Creatinine, Ser: 3.83 mg/dL — ABNORMAL HIGH (ref 0.44–1.00)
Creatinine, Ser: 3.98 mg/dL — ABNORMAL HIGH (ref 0.44–1.00)
GFR calc Af Amer: 15 mL/min — ABNORMAL LOW (ref >60)
GFR calc Af Amer: 14 mL/min — ABNORMAL LOW (ref >60)
GFR calc non Af Amer: 13 mL/min — ABNORMAL LOW (ref >60)
GFR calc non Af Amer: 12 mL/min — ABNORMAL LOW (ref >60)
Glucose, Bld: 188 mg/dL — ABNORMAL HIGH (ref 70–99)
Glucose, Bld: 178 mg/dL — ABNORMAL HIGH (ref 70–99)
Potassium: 5.7 mmol/L — ABNORMAL HIGH (ref 3.5–5.1)
Potassium: 5.3 mmol/L — ABNORMAL HIGH (ref 3.5–5.1)
Sodium: 133 mmol/L — ABNORMAL LOW (ref 135–145)
Sodium: 135 mmol/L (ref 135–145)

## 2019-11-10 LAB — MAGNESIUM: Magnesium: 2.5 mg/dL — ABNORMAL HIGH (ref 1.7–2.4)

## 2019-11-10 MED ORDER — METOLAZONE 5 MG PO TABS: 5.0000 mg | ORAL_TABLET | Freq: Once | ORAL | Status: DC

## 2019-11-10 MED ORDER — PATIROMER SORBITEX CALCIUM 8.4 G PO PACK: 16.8000 g | PACK | Freq: Once | ORAL | Status: DC

## 2019-11-10 MED ORDER — SODIUM ZIRCONIUM CYCLOSILICATE 10 G PO PACK
10.0000 g | PACK | Freq: Once | ORAL | Status: AC
  Administered 2019-11-10: 10 g via ORAL
  Filled 2019-11-10: qty 1

## 2019-11-10 MED ORDER — METOLAZONE 5 MG PO TABS
5.0000 mg | ORAL_TABLET | ORAL | Status: AC
  Administered 2019-11-10: 5 mg via ORAL
  Filled 2019-11-10: qty 1

## 2019-11-10 MED ORDER — SODIUM BICARBONATE 8.4 % IV SOLN
50.0000 meq | Freq: Once | INTRAVENOUS | Status: AC
  Administered 2019-11-10: 50 meq via INTRAVENOUS
  Filled 2019-11-10: qty 50

## 2019-11-10 MED ORDER — SODIUM POLYSTYRENE SULFONATE 15 GM/60ML PO SUSP
30.0000 g | Freq: Once | ORAL | Status: AC
  Administered 2019-11-10: 17:00:00 30 g via ORAL
  Filled 2019-11-10: qty 120

## 2019-11-10 MED ORDER — PATIROMER SORBITEX CALCIUM 8.4 G PO PACK
16.8000 g | PACK | Freq: Once | ORAL | Status: DC
  Filled 2019-11-10: qty 2

## 2019-11-10 MED ORDER — CALCIUM GLUCONATE-NACL 1-0.675 GM/50ML-% IV SOLN
1.0000 g | Freq: Once | INTRAVENOUS | Status: AC
  Administered 2019-11-10: 1000 mg via INTRAVENOUS
  Filled 2019-11-10: qty 50

## 2019-11-10 MED ORDER — FUROSEMIDE 10 MG/ML IJ SOLN
10.0000 mg/h | INTRAVENOUS | Status: DC
  Administered 2019-11-10: 10 mg/h via INTRAVENOUS
  Filled 2019-11-10: qty 25

## 2019-11-10 MED ORDER — CHLORHEXIDINE GLUCONATE CLOTH 2 % EX PADS
6.0000 | MEDICATED_PAD | Freq: Every day | CUTANEOUS | Status: DC
  Administered 2019-11-11: 6 via TOPICAL

## 2019-11-10 MED ORDER — FUROSEMIDE 10 MG/ML IJ SOLN
120.0000 mg | Freq: Once | INTRAVENOUS | Status: AC
  Administered 2019-11-10: 120 mg via INTRAVENOUS
  Filled 2019-11-10: qty 2

## 2019-11-10 NOTE — Plan of Care (Signed)
Spoke with patient and her daughter via phone.  We discussed her declining renal function.  She has AKI.  We discussed options of dialysis, medical measures (which are not working) and comfort measures.  She would like to pursue dialysis.  We discussed that she is a poor long-term dialysis candidate given her habitus should her renal function not recover.    Placed order for IR consult and orders for HD for 2/26.  She had veltassa after 1549 after the labs were drawn.  Ordered lasix.  lokelma is also ordered and repeat labs are ordered  Claudia Desanctis 11/10/2019 6:55 PM

## 2019-11-10 NOTE — Progress Notes (Addendum)
CSW acknowledges SNF consult and recommendation by PT. Will continue to follow pending medical readiness to identify appropriate discharge venue, and if patient will need HD.   [place], [place]

## 2019-11-10 NOTE — Evaluation (Signed)
Occupational Therapy Evaluation Patient Details Name: [name] MRN: [id number] DOB: [birth year] Today's Date: 11/10/2019    History of Present Illness Pt is a 54 y/o female with PMH including but not limited to chronic systolic heart failure, diabetes mellitus, morbid obesity, chronic kidney disease was brought in for bilateral lower extremity edema and worsening back pain due to a fall from the stretcher 2 weeks ago.  On arrival to ED she was found to be in acute kidney injury and acute on chronic systolic heart failure.   Clinical Impression    Patient is a 54 year old female that lives with her family and has aid support for self care, functional transfers at baseline. Currently patient is lethargic, impaired cognition with difficulty finishing her thoughts and carry through with UB exercises. Max cues to sequence crossing body midline to attempt rolling (unable to roll onto side). Recommend continued acute OT services to maximize patient safety, strength, activity tolerance to reduce caregiver burden.   Follow Up Recommendations  SNF    Equipment Recommendations  Other (comment)(defer to next venue)       Precautions / Restrictions Precautions Precautions: Fall Precaution Comments: 476 lbs last weight Restrictions Weight Bearing Restrictions: No      Mobility Bed Mobility Overal bed mobility: Needs Assistance Bed Mobility: Rolling           General bed mobility comments: attempted to roll in bed with use of bed rails, patient requires max cues for direction following and initiating crossing body midline, unable to mobilize  Transfers Overall transfer level: Needs assistance               General transfer comment: deferred, anticipate need for bari lift equipment        ADL either performed or assessed with clinical judgement   ADL Overall ADL's : Needs assistance/impaired     Grooming: Minimal assistance;Bed level   Upper Body Bathing: Maximal  assistance;Bed level   Lower Body Bathing: Total assistance;Bed level   Upper Body Dressing : Maximal assistance;Total assistance;Bed level   Lower Body Dressing: Total assistance;Bed level   Toilet Transfer: Total assistance Toilet Transfer Details (indicate cue type and reason): anticipate assist x2-3 for mobility due to body habitus, poor cognition, weakness Toileting- Clothing Manipulation and Hygiene: Total assistance;Bed level         General ADL Comments: unsure of patient's baseline, per chart has aid ~5hrs a day to assist with self care. patient reports she wears pads due to incontinence                  Pertinent Vitals/Pain Pain Assessment: Faces Faces Pain Scale: Hurts little more Pain Location: B LEs, back Pain Descriptors / Indicators: Sore Pain Intervention(s): Monitored during session     Hand Dominance Right   Extremity/Trunk Assessment Upper Extremity Assessment Upper Extremity Assessment: Generalized weakness   Lower Extremity Assessment Lower Extremity Assessment: Defer to PT evaluation   Cervical / Trunk Assessment Cervical / Trunk Assessment: Other exceptions Cervical / Trunk Exceptions: body habitus   Communication Communication Communication: Expressive difficulties;Other (comment)(possibly due to lethargy? difficulty completing thoughts)   Cognition Arousal/Alertness: Lethargic Behavior During Therapy: Flat affect Overall Cognitive Status: Impaired/Different from baseline Area of Impairment: Orientation;Attention;Memory;Following commands;Safety/judgement;Awareness;Problem solving                 Orientation Level: Disoriented to;Time(first states year 2002, corrects herself to 2022) Current Attention Level: Sustained Memory: Decreased recall of precautions;Decreased short-term memory Following Commands: Follows one  step commands inconsistently;Follows one step commands with increased time Safety/Judgement: Decreased awareness of  deficits;Decreased awareness of safety Awareness: Intellectual Problem Solving: Slow processing;Decreased initiation;Difficulty sequencing;Requires verbal cues;Requires tactile cues     General Comments  educate patient to implement UB/LB exercises performed during session throughout the day for strengthening, edema management    Exercises Exercises: General Upper Extremity;General Lower Extremity General Exercises - Upper Extremity Shoulder Flexion: AROM;10 reps;Strengthening;Supine;Both Digit Composite Flexion: AROM;Strengthening;Both;10 reps;Supine Composite Extension: AROM;Both;10 reps;Supine General Exercises - Lower Extremity Ankle Circles/Pumps: AAROM;Both;5 reps;Supine        Home Living Family/patient expects to be discharged to:: Skilled nursing facility Living Arrangements: Children                               Additional Comments: daughter and pt would like to pursue SNF      Prior Functioning/Environment Level of Independence: Needs assistance  Gait / Transfers Assistance Needed: pt was not ambulating prior to arrival; she could perform transfers with physical assistance of two people ADL's / Homemaking Assistance Needed: pt has an aide that comes everday for ~5 hrs who assists her with ADLs   Comments: info obtained from PT eval, patient having difficulty completing her thoughts/providing history        OT Problem List: Decreased strength;Decreased activity tolerance;Impaired balance (sitting and/or standing);Decreased cognition;Decreased safety awareness;Obesity;Increased edema      OT Treatment/Interventions: Self-care/ADL training;Therapeutic exercise;Therapeutic activities;Cognitive remediation/compensation;Patient/family education;Balance training    OT Goals(Current goals can be found in the care plan section) Acute Rehab OT Goals Patient Stated Goal: go to rehab OT Goal Formulation: With patient Time For Goal Achievement:  11/24/19 Potential to Achieve Goals: Good  OT Frequency: Min 2X/week    AM-PAC OT "6 Clicks" Daily Activity     Outcome Measure Help from another person eating meals?: A Little Help from another person taking care of personal grooming?: A Little Help from another person toileting, which includes using toliet, bedpan, or urinal?: Total Help from another person bathing (including washing, rinsing, drying)?: Total Help from another person to put on and taking off regular upper body clothing?: Total Help from another person to put on and taking off regular lower body clothing?: Total 6 Click Score: 10   End of Session Equipment Utilized During Treatment: Oxygen Nurse Communication: Mobility status  Activity Tolerance: Patient limited by lethargy Patient left: in bed;with call bell/phone within reach  OT Visit Diagnosis: Other abnormalities of gait and mobility (R26.89);Muscle weakness (generalized) (M62.81)                Time: 3817-7116 OT Time Calculation (min): 21 min Charges:  OT General Charges $OT Visit: 1 Visit OT Evaluation $OT Eval Moderate Complexity: [facility] OT OT office: [place] 11/10/2019, 12:44 PM

## 2019-11-10 NOTE — Progress Notes (Addendum)
PROGRESS NOTE    [name]  EVO:[id number] DOB: 1965-11-07 DOA: 11/07/2019 PCP: Vonna Drafts, FNP  Brief Narrative:  54 year old lady with prior history of chronic systolic heart failure, diabetes mellitus, morbid obesity, chronic kidney disease was brought in for bilateral lower extremity edema and worsening back pain due to a fall from the stretcher 2 weeks ago.  On arrival to ED she was found to be in acute kidney injury and acute on chronic systolic heart failure.  Assessment & Plan:   Principal Problem:   ARF (acute renal failure) (HCC) Active Problems:   Essential hypertension   Diabetes mellitus ([place])   Acute on chronic systolic congestive heart failure (HCC)   CKD (chronic kidney disease), stage III   Hyperkalemia   Acute on chronic stage IV kidney disease with hyperkalemia, oliguric CT of the abdomen ruled out obstruction.  Probably cardiorenal syndrome from CHF. Aggressive diuresis with Lasix gtt  as per nephrology. [place] for hyperkalemia.Patient is at high risk for dialysis. Avoid nephrotoxins and monitor renal parameters and urine output. Potassium this evening at 6.6 and creatinine has worsened to 4.   Acute respiratory failure with hypoxia requiring 2 L of nasal cannula oxygen Acute on chronic systolic and diastolic heart failure heart failure/nonischemic cardiomyopathy Last echo cardiogram on 09/12/2019 showed ejection fraction of 35 to 40%, global hypokinesis, grade 1 diastolic dysfunction. Cardiology on board and appreciate recommendations. Continue with aggressive diuresis with IV Lasix gtt.  Daily weights and strict intake and output. Appreciate cardiology input  Acute  Metabolic Encephalopathy: Suspect from ESRD and uremia.   Morbid obesity:  Body mass index is 93.35 kg/m.   Elevated troponins Probably secondary to demand ischemia from acute CHF.  Myoclonic jerks with some tremors possibly secondary to uremia.  Gabapentin on hold  since admission   Neck pain CT of the cervical spine was done on 2/23 and it shows 1. No acute osseous abnormality identified in the cervical spine. Solid C4-C7 anterior fusion. Pain control  Back pain CT of the lumbar spine on admission shows No acute osseous abnormality identified in the lumbar spine. Mid and lower lumbar disc degeneration and advanced facet arthrosis with chronic severe spinal stenosis at L3-4 and at least moderate spinal stenosis at L4-5. Severe left neural foraminal stenosis at L5-S1. Pain control and PT evaluation. PT evaluation recommending SNF   Diabetes mellitus CBG (last 3)  Recent Labs    11/09/19 1645 11/09/19 2045 11/10/19 0633  GLUCAP 194* 279* 180*   Continue with sliding scale insulin.  No changes in medications   Abnormal UA/UTI Urine cultures show multiple bacteria, DC Rocephin after today's dose.   Essential hypertension Blood pressure parameters are well controlled, continue with Norvasc, BiDil .  Pressure injury present on admission Pressure Injury 11/12/17 Stage II -  Partial thickness loss of dermis presenting as a shallow open ulcer with a red, pink wound bed without slough. 2x1 cm,located in the lower L buttock (Active)  11/12/17 1045  Location: Buttocks  Location Orientation: Left  Staging: Stage II -  Partial thickness loss of dermis presenting as a shallow open ulcer with a red, pink wound bed without slough.  Wound Description (Comments): 2x1 cm,located in the lower L buttock  Present on Admission: Yes       In view of the above, AKI, CHF, NICM, poor functional status, poor progression, morbid obesity, poor prognosis from the above medical issues will request for palliative care consult for goals of care.  DVT prophylaxis: (Heparin subcu 3 times daily Code Status: Full code Family Communication: Discussed with daughter over the phone.    disposition Plan:  . Patient is from home, therapy eval is pending patient not  stable for discharge yet evaluation for acute kidney injury in progress. [place] Kitchen Possible d/c to SNF.    Consultants:   Nephrology  cadiology  Procedures: (CT of the abdomen without contrast CT of the cervical spine and lumbar spine without contrast  Antimicrobials: Rocephin for UTI  Subjective: Pt appears confused. No chest pain. Sob present.  Patient denies any nausea or vomiting.   Objective: Vitals:   11/10/19 0024 11/10/19 0620 11/10/19 0630 11/10/19 0751  BP: 102/76  119/62 112/63  Pulse: 81  81 81  Resp: 20  (!) 21 (!) 21  Temp: 98.1 F (36.7 C)  98 F (36.7 C) 97.9 F (36.6 C)  TempSrc: Oral  Oral Oral  SpO2: 97%  95% 97%  Weight:  (!) 239 kg    Height:        Intake/Output Summary (Last 24 hours) at 11/10/2019 1046 Last data filed at 11/10/2019 1003 Gross per 24 hour  Intake 1170 ml  Output 252 ml  Net 918 ml   Filed Weights   11/08/19 0116 11/09/19 0617 11/10/19 0620  Weight: (!) 217.7 kg (!) 215.9 kg (!) 239 kg    Examination:  General exam: Morbidly obese lady, not in any kind of distress Respiratory system: Decreased air entry at bases, no wheezing or rhonchi Cardiovascular system: S1-S2 heard, bilateral leg edema present, no JVD. Gastrointestinal system: Abdomen is soft, mild generalized tenderness, nonspecific, soft, bowel sounds normal Central nervous system: Lethargic confused not able to follow commands. Extremities: Bilateral leg edema present Skin: left buttock stage 2 pressure injury.  Psychiatry: Flat affect    Data Reviewed: I have personally reviewed following labs and imaging studies  CBC: Recent Labs  Lab 11/07/19 2145 11/08/19 0459 11/09/19 0408 11/10/19 0519  WBC 5.8 5.4 5.4 5.4  NEUTROABS 4.6 4.1  --   --   HGB 10.3* 10.0* 9.7* 9.5*  HCT 35.6* 34.4* 33.4* 31.5*  MCV 95.4 93.5 93.8 92.4  PLT 214 196 206 623   Basic Metabolic Panel: Recent Labs  Lab 11/07/19 2145 11/08/19 0459 11/09/19 0408 11/10/19 0519  NA 133*  133* 134* 133*  K 5.8* 5.4* 5.9* 5.7*  CL 98 98 98 97*  CO2 23 24 24 23   GLUCOSE 240* 205* 183* 188*  BUN 77* 76* 80* 85*  CREATININE 3.57* 3.59* 3.76* 3.83*  CALCIUM 8.9 8.9 8.9 8.9  MG  --   --  2.3 2.5*   GFR: Estimated Creatinine Clearance: 34.4 mL/min (A) (by C-G formula based on SCr of 3.83 mg/dL (H)). Liver Function Tests: Recent Labs  Lab 11/07/19 2145 11/08/19 0459  AST 23 21  ALT 15 16  ALKPHOS 82 83  BILITOT 1.4* 1.3*  PROT 9.3* 9.0*  ALBUMIN 3.6 3.6   No results for input(s): LIPASE, AMYLASE in the last 168 hours. No results for input(s): AMMONIA in the last 168 hours. Coagulation Profile: No results for input(s): INR, PROTIME in the last 168 hours. Cardiac Enzymes: No results for input(s): CKTOTAL, CKMB, CKMBINDEX, TROPONINI in the last 168 hours. BNP (last 3 results) No results for input(s): PROBNP in the last 8760 hours. HbA1C: No results for input(s): HGBA1C in the last 72 hours. CBG: Recent Labs  Lab 11/09/19 0737 11/09/19 1127 11/09/19 1645 11/09/19 2045 11/10/19 7628  GLUCAP  178* 172* 194* 279* 180*   Lipid Profile: No results for input(s): CHOL, HDL, LDLCALC, TRIG, CHOLHDL, LDLDIRECT in the last 72 hours. Thyroid Function Tests: No results for input(s): TSH, T4TOTAL, FREET4, T3FREE, THYROIDAB in the last 72 hours. Anemia Panel: No results for input(s): VITAMINB12, FOLATE, FERRITIN, TIBC, IRON, RETICCTPCT in the last 72 hours. Sepsis Labs: No results for input(s): PROCALCITON, LATICACIDVEN in the last 168 hours.  Recent Results (from the past 240 hour(s))  SARS CORONAVIRUS 2 (TAT 6-24 HRS) Nasopharyngeal Nasopharyngeal Swab     Status: None   Collection Time: 11/07/19 11:10 PM   Specimen: Nasopharyngeal Swab  Result Value Ref Range Status   SARS Coronavirus 2 NEGATIVE NEGATIVE Final    Comment: (NOTE) SARS-CoV-2 target nucleic acids are NOT DETECTED. The SARS-CoV-2 RNA is generally detectable in upper and lower respiratory specimens  during the acute phase of infection. Negative results do not preclude SARS-CoV-2 infection, do not rule out co-infections with other pathogens, and should not be used as the sole basis for treatment or other patient management decisions. Negative results must be combined with clinical observations, patient history, and epidemiological information. The expected result is Negative. Fact Sheet for Patients: SugarRoll.be Fact Sheet for Healthcare Providers: https://www.woods-mathews.com/ This test is not yet approved or cleared by the [place] FDA and  has been authorized for detection and/or diagnosis of SARS-CoV-2 by FDA under an Emergency Use Authorization (EUA). This EUA will remain  in effect (meaning this test can be used) for the duration of the COVID-19 declaration under Section 56 4(b)(1) of the Act, 21 U.S.C. section 360bbb-3(b)(1), unless the authorization is terminated or revoked sooner. Performed at [facility], [place] [address] [place], [place] [address]   Culture, Urine     Status: Abnormal   Collection Time: 11/08/19  7:36 AM   Specimen: Urine, Random  Result Value Ref Range Status   Specimen Description URINE, RANDOM  Final   Special Requests   Final    NONE Performed at [facility], [place] [address] [place], [place] [address]    Culture MULTIPLE SPECIES PRESENT, SUGGEST RECOLLECTION (A)  Final   Report Status 11/09/2019 FINAL  Final         Radiology Studies: CT ABDOMEN PELVIS WO CONTRAST  Result Date: 11/08/2019 CLINICAL DATA:  [place] 2 weeks ago. Abdominal and back pain. Labored breathing. EXAM: CT ABDOMEN AND PELVIS WITHOUT CONTRAST TECHNIQUE: Multidetector CT imaging of the abdomen and pelvis was performed following the standard protocol without IV contrast. COMPARISON:  None. FINDINGS: Extremely limited examination due to body habitus. The patient's skin is touching the gantry and creating significant  artifact. Study also limited by patient motion and lack of IV contrast. Lower chest: Breathing motion artifact. No obvious pulmonary infiltrates or effusions. The heart is mildly enlarged. No pericardial effusion. Hepatobiliary: No gross abnormalities identified. The gallbladder is surgically absent. Pancreas: No gross abnormality. Spleen: Within normal limits in size. No obvious lesions. Adrenals/Urinary Tract: Difficult to evaluate the adrenal glands. Both kidneys are grossly normal. No renal calculi or hydronephrosis. Limited examination of the bladder. Stomach/Bowel: The stomach, duodenum, small bowel and colon are grossly normal but examination is quite limited. No findings for small bowel obstruction or free air. Vascular/Lymphatic: The aorta is normal in caliber. No obvious atherosclerotic calcifications or aneurysm. Reproductive: I believe the uterus and ovaries are normal. Other: No pelvic mass or free pelvic fluid collections. Diffuse body wall edema. Musculoskeletal: No gross bony abnormalities but very  limited examination. Advanced bilateral hip joint degenerative changes. IMPRESSION: 1. Very limited examination due to body habitus, motion artifact and lack of IV contrast. 2. No obvious/gross acute abdominal/pelvic findings, mass lesions or adenopathy. 3. Diffuse body wall edema. 4. Advanced bilateral hip joint degenerative changes. Electronically Signed   By: Marijo Sanes M.D.   On: 11/08/2019 16:30        Scheduled Meds: . allopurinol  300 mg Oral Daily  . amLODipine  5 mg Oral Daily  . [START ON 11/12/2019] buprenorphine  1 patch Transdermal Q Sat  . Chlorhexidine Gluconate Cloth  6 each Topical Daily  . heparin  5,000 Units Subcutaneous Q8H  . insulin aspart  0-6 Units Subcutaneous TID WC  . isosorbide-hydrALAZINE  2 tablet Oral TID  . metoprolol succinate  25 mg Oral Daily  . oxyCODONE  20 mg Oral Q12H  . patiromer  16.8 g Oral Daily   Continuous Infusions: . cefTRIAXone  (ROCEPHIN)  IV 1 g (11/10/19 0950)  . furosemide (LASIX) infusion       LOS: 3 days        Hosie Poisson, MD [facility]   To contact the attending provider between 7A-7P or the covering provider during after hours 7P-7A, please log into the web site [email or web address] and access using universal [place] password for that web site. If you do not have the password, please call the hospital operator.  11/10/2019, 10:46 AM

## 2019-11-10 NOTE — Progress Notes (Signed)
Subjective:  Very upset with the thought of having to have dialysis.   Objective:  Vital Signs in the last 24 hours: Temp:  [97.9 F (36.6 C)-98.6 F (37 C)] 97.9 F (36.6 C) (02/25 0751) Pulse Rate:  [75-81] 81 (02/25 0751) Resp:  [17-21] 21 (02/25 0751) BP: (102-132)/(57-76) 112/63 (02/25 0751) SpO2:  [94 %-100 %] 97 % (02/25 0751) Weight:  [239 kg] 239 kg (02/25 0620)  Intake/Output from previous day: 02/24 0701 - 02/25 0700 In: 1510 [P.O.:1360; IV Piggyback:150] Out: 251 [Urine:250; Stool:1]  Physical Exam  Constitutional: She is oriented to person, place, and time. She appears well-developed and well-nourished. No distress.  Morbid obesity  HENT:  Head: Normocephalic and atraumatic.  Eyes: Pupils are equal, round, and reactive to light. Conjunctivae are normal.  Neck: No JVD present.  Cardiovascular: Normal rate, regular rhythm and intact distal pulses.  Pulmonary/Chest: Effort normal and breath sounds normal. She has no wheezes. She has no rales.  Abdominal: Soft. Bowel sounds are normal. There is no rebound.  Musculoskeletal:        General: Edema (3+ b/l) present.  Lymphadenopathy:    She has no cervical adenopathy.  Neurological: She is alert and oriented to person, place, and time. No cranial nerve deficit.  Skin: Skin is warm and dry.  Psychiatric:  Upset and tearful  Nursing note and vitals reviewed.    Lab Results: BMP Recent Labs    11/08/19 0459 11/09/19 0408 11/10/19 0519  NA 133* 134* 133*  K 5.4* 5.9* 5.7*  CL 98 98 97*  CO2 24 24 23   GLUCOSE 205* 183* 188*  BUN 76* 80* 85*  CREATININE 3.59* 3.76* 3.83*  CALCIUM 8.9 8.9 8.9  GFRNONAA 14* 13* 13*  GFRAA 16* 15* 15*    CBC Recent Labs  Lab 11/08/19 0459 11/09/19 0408 11/10/19 0519  WBC 5.4   < > 5.4  RBC 3.68*   < > 3.41*  HGB 10.0*   < > 9.5*  HCT 34.4*   < > 31.5*  PLT 196   < > 180  MCV 93.5   < > 92.4  MCH 27.2   < > 27.9  MCHC 29.1*   < > 30.2  RDW 16.2*   < > 16.6*   LYMPHSABS 0.6*  --   --   MONOABS 0.6  --   --   EOSABS 0.0  --   --   BASOSABS 0.0  --   --    < > = values in this interval not displayed.    HEMOGLOBIN A1C Lab Results  Component Value Date   HGBA1C 8.8 (H) 09/11/2019   MPG 205.86 09/11/2019    Cardiac Panel (last 3 results) No results for input(s): CKTOTAL, CKMB, TROPONINI, RELINDX in the last 8760 hours.  BNP (last 3 results) Recent Labs    09/10/19 2341 10/15/19 2252 11/07/19 2145  BNP 313.1* 562.8* 584.6*    Lipid Panel     Component Value Date/Time   CHOL 101 03/05/2011 0545   TRIG 60 03/05/2011 0545   HDL 29 (L) 03/05/2011 0545   CHOLHDL 3.5 03/05/2011 0545   VLDL 12 03/05/2011 0545   LDLCALC 60 03/05/2011 0545     Hepatic Function Panel Recent Labs    10/20/19 0137 11/07/19 2145 11/08/19 0459  PROT 8.2* 9.3* 9.0*  ALBUMIN 3.1* 3.6 3.6  AST 31 23 21   ALT 16 15 16   ALKPHOS 113 82 83  BILITOT 1.1 1.4* 1.3*  BILIDIR  --   --  0.6*  IBILI  --   --  0.7   CARDIAC STUDIES:  EKG 11/07/2019: Sinus rhythm. Low voltage. Right axis deviation.  Echocardiogram 09/12/2019: LVEF 35-40%. Global hypokinesis. Grade 1 DD. Limited evaluation due to poor acoustic windows.  Assessment & Recommendations:  54 year old African-American female with hypertension, type II diabetes mellitus, tobacco abuse, morbid obesity, acute on chronic systolic heart failure, AKI/CKD.  Acute on chronic systolic heart failure: Nonishcemic cardiomyopathy. Not hypoxic on 2 L O2. 3+ pitting edema. Elevated BNP. Fortunately making urine now on lasix 120 mg tid. Difficult situation with her co-morbidities. Dialysis will be very difficult. Hopefully she will not require it.   Troponin elevation: Supply demand mismatch due to HF exacerbation. Not MI.  AKI/CKD: Nephrology on board.   Nigel Mormon, M.D. [facility], [place] Pager: [phone] Office: [phone]

## 2019-11-10 NOTE — Progress Notes (Addendum)
[facility] Progress Note  Name: [name] MRN: [id number] DOB: July 23, 1966  Chief Complaint:  Pain after a fall and progressive swelling   Subjective:  Yesterday she was changed to lasix 120 mg IV every 8 hours.  Strict ins/outs not available but had 250 mL uop and one unmeasured void yesterday.  Still feels swollen.   Review of systems:  Denies any shortness of breath  Denies cp  Denies n/v ------ Background on consult:  51F with extensive past medical history of morbid obesity, chronic systolic heart failure with recent exacerbation earlier this year, type 2 diabetes, very limited mobility secondary to habitus and OA/malfunction of left TKR, OSA, chronic pain on narcotics who was admitted on 2/23 presenting with intractable lumbar and cervical neck pain following a fall, increased myoclonus, and progressive peripheral edema.  Systolic heart failure due to nonischemic cardiomyopathy, she was admitted in January with decompensated heart failure, twice.  She has had progressive decline in renal function over the past 3 years, most recently with a creatinine between 1.8 and 2.0.  Presenting creatinine was 3.6, 3.76 today.  This has been complicated by hyperkalemia, currently requiring Lokelma 10 g twice daily.  Today's value is 5.9.  She is on Lasix 40 mg IV twice daily with very little urine output to date, she has a Foley catheter.  She is unable to obtain daily weights.  She does note significant progressive edema throughout the legs, and across the abdomen.  UA at presentation with 11-20 erythrocytes per high-powered field, significant pyuria, greater than 50 per high-power field, 2+ proteinuria.  Her urine sodium is less than 10.  CT of the abdomen and pelvis yesterday without evidence of hydronephrosis or obstruction.  She denies use of NSAIDs preceding admission.  No exposure to TMP/SMX.  No IV contrast.  She appears to be 7 to 10 kg above her preceding weights, if  accurate.  Most recent TTE was 09/12/2019 and had an LVEF of 35 to 40%, mild LVH; grade 1 diastolic dysfunction. the exam was very limited.  Her main concern currently is her ongoing chronic pain.  It is unclear if she has exertional symptoms.  Her mother was a dialysis patient locally, who died in 2018/01/04.   Intake/Output Summary (Last 24 hours) at 11/10/2019 0929 Last data filed at 11/10/2019 8786 Gross per 24 hour  Intake 1170 ml  Output 251 ml  Net 919 ml    Vitals:  Vitals:   11/10/19 0024 11/10/19 0620 11/10/19 0630 11/10/19 0751  BP: 102/76  119/62 112/63  Pulse: 81  81 81  Resp: 20  (!) 21 (!) 21  Temp: 98.1 F (36.7 C)  98 F (36.7 C) 97.9 F (36.6 C)  TempSrc: Oral  Oral Oral  SpO2: 97%  95% 97%  Weight:  (!) 239 kg    Height:         Physical Exam:  General adult female in bed in no acute distress. Obese body habitus  HEENT normocephalic atraumatic extraocular movements intact sclera anicteric Neck increased circumference  Lungs reduced on auscultation bilaterally normal work of breathing at rest  Heart S1S2 no rub Abdomen soft obese habitus  Extremities bilateral lower extremity edema and abd wall edema  Psych normal mood and affect Neuro - alert and oriented and conversant   Medications reviewed  Labs:  BMP Latest Ref Rng & Units 11/10/2019 11/09/2019 11/08/2019  Glucose 70 - 99 mg/dL 188(H) 183(H) 205(H)  BUN 6 - 20 mg/dL 85(H)  80(H) 76(H)  Creatinine 0.44 - 1.00 mg/dL 3.83(H) 3.76(H) 3.59(H)  Sodium 135 - 145 mmol/L 133(L) 134(L) 133(L)  Potassium 3.5 - 5.1 mmol/L 5.7(H) 5.9(H) 5.4(H)  Chloride 98 - 111 mmol/L 97(L) 98 98  CO2 22 - 32 mmol/L 23 24 24   Calcium 8.9 - 10.3 mg/dL 8.9 8.9 8.9     Assessment/Plan:   50F with AoCKD3, decompensated systolic CHF, morbid obesity with limited mobility, chronic pain, mild hyperkalemia  1. AoCKD3: No obstruction, no nephrotoxic exposures, absent urine sodium suggests cardiorenal syndrome which is consistent with  clinical picture.  1. She is high risk of progression to dialysis dependence which would be very difficult given comorbidities  2. Change to lasix gtt at 10 mg/hr 3. Would continue foley - RN is flushing as has sediment 4. Metolazone 5 mg once now   5. Renal panel at 1500   6. Have made NPO after midnight in the event procedure is needed on 2/26.  Discussed with patient. 2. Hyperkalemia - was switched to veltassa for managment  3. Acute on chronic decompensated systolic heart failure, nonischemic 4. CKD stage III - most recently with a creatinine between 1.8 and 2.0. 5. Super morbid obesity - dialysis would be very difficult in the setting of supermorbid obesity 6. Limited mobility - secondary to above 7. Chronic pain, on longstanding narcotics 8. DM2 - per primary team  9. OSA - per -primary team  10. Anemia, normocytic; features of iron deficiency 09/16/19  Claudia Desanctis, MD 11/10/2019 9:53 AM

## 2019-11-11 DIAGNOSIS — Z7189 Other specified counseling: Secondary | ICD-10-CM

## 2019-11-11 DIAGNOSIS — Z515 Encounter for palliative care: Secondary | ICD-10-CM

## 2019-11-11 LAB — CBC
HCT: 30.4 % — ABNORMAL LOW (ref 36.0–46.0)
Hemoglobin: 9.3 g/dL — ABNORMAL LOW (ref 12.0–15.0)
MCH: 27.5 pg (ref 26.0–34.0)
MCHC: 30.6 g/dL (ref 30.0–36.0)
MCV: 89.9 fL (ref 80.0–100.0)
Platelets: 179 10*3/uL (ref 150–400)
RBC: 3.38 MIL/uL — ABNORMAL LOW (ref 3.87–5.11)
RDW: 16.7 % — ABNORMAL HIGH (ref 11.5–15.5)
WBC: 5.4 10*3/uL (ref 4.0–10.5)
nRBC: 0.4 % — ABNORMAL HIGH (ref 0.0–0.2)

## 2019-11-11 LAB — BASIC METABOLIC PANEL
Anion gap: 12 (ref 5–15)
BUN: 87 mg/dL — ABNORMAL HIGH (ref 6–20)
CO2: 26 mmol/L (ref 22–32)
Calcium: 8.9 mg/dL (ref 8.9–10.3)
Chloride: 97 mmol/L — ABNORMAL LOW (ref 98–111)
Creatinine, Ser: 3.88 mg/dL — ABNORMAL HIGH (ref 0.44–1.00)
GFR calc Af Amer: 15 mL/min — ABNORMAL LOW (ref >60)
GFR calc non Af Amer: 13 mL/min — ABNORMAL LOW (ref >60)
Glucose, Bld: 140 mg/dL — ABNORMAL HIGH (ref 70–99)
Potassium: 4.4 mmol/L (ref 3.5–5.1)
Sodium: 135 mmol/L (ref 135–145)

## 2019-11-11 LAB — GLUCOSE, CAPILLARY
Glucose-Capillary: 153 mg/dL — ABNORMAL HIGH (ref 70–99)
Glucose-Capillary: 151 mg/dL — ABNORMAL HIGH (ref 70–99)
Glucose-Capillary: 150 mg/dL — ABNORMAL HIGH (ref 70–99)
Glucose-Capillary: 160 mg/dL — ABNORMAL HIGH (ref 70–99)

## 2019-11-11 LAB — MAGNESIUM: Magnesium: 2.3 mg/dL (ref 1.7–2.4)

## 2019-11-11 MED ORDER — SODIUM CHLORIDE 0.9 % IV SOLN
510.0000 mg | Freq: Once | INTRAVENOUS | Status: AC
  Administered 2019-11-11: 510 mg via INTRAVENOUS
  Filled 2019-11-11: qty 17

## 2019-11-11 MED ORDER — ISOSORB DINITRATE-HYDRALAZINE 20-37.5 MG PO TABS
1.0000 | ORAL_TABLET | Freq: Three times a day (TID) | ORAL | Status: DC
  Administered 2019-11-11 – 2019-11-14 (×10): 1 via ORAL
  Filled 2019-11-11 (×10): qty 1

## 2019-11-11 MED ORDER — HEPARIN SODIUM (PORCINE) 5000 UNIT/ML IJ SOLN
5000.0000 [IU] | Freq: Three times a day (TID) | INTRAMUSCULAR | Status: DC
  Administered 2019-11-11 – 2019-11-21 (×29): 5000 [IU] via SUBCUTANEOUS
  Filled 2019-11-11 (×29): qty 1

## 2019-11-11 MED ORDER — FUROSEMIDE 10 MG/ML IJ SOLN
120.0000 mg | Freq: Once | INTRAVENOUS | Status: AC
  Administered 2019-11-11: 10:00:00 120 mg via INTRAVENOUS
  Filled 2019-11-11: qty 10

## 2019-11-11 MED ORDER — FUROSEMIDE 10 MG/ML IJ SOLN
120.0000 mg | Freq: Once | INTRAVENOUS | Status: AC
  Administered 2019-11-11: 120 mg via INTRAVENOUS
  Filled 2019-11-11: qty 10

## 2019-11-11 NOTE — Progress Notes (Signed)
Minimal output this shift even with IV lasix. Flushed foley to check if it was obstructed. Flushed without resistance.

## 2019-11-11 NOTE — Consult Note (Signed)
                                                                                 Consultation Note Date: 11/11/2019   Patient Name: [name]  DOB: 04/08/1966  MRN: [id number]  Age / Sex: 54 y.o., female  PCP: Anderson, Takela N, FNP Referring Physician: Akula, Vijaya, MD  Reason for Consultation: Establishing goals of care  HPI/Patient Profile:  [name] is a 54 y.o. female with history of chronic systolic heart failure, diabetes mellitus, morbid obesity, chronic kidney disease who was admitted January last month for CHF exacerbation subsequent which patient came back to the ER the following week for accidental overdose of oxycodone presents to the ER after patient has been having increasing neck pain and low back pain after having a fall about 2 weeks ago.  Denies any weakness of the extremities or any incontinence of urine or bowel.  Patient has noted increasing tremors of the extremities.  Patient also noted increasing bilateral lower extremity edema with no definite shortness of breath or chest pain.  Palliative care was asked to be involved to help with goals of care conversations in the setting of reduced renal function. Patient thought to be a poor OP HD candidate given her morbid obesity.   Clinical Assessment and Goals of Care: I have reviewed medical records including EPIC notes, labs and imaging, received report from bedside RN, assessed the patient.    I met with Shabnam Filkins and her daughter,  [name] at bedside to further discuss diagnosis prognosis, GOC, EOL wishes, disposition and options.   Lucciana shared that she was very worried that she would not produce urine, but had good urine output overnight. We discussed the severity of her kidney disease. She had initially been slated to receive dialysis though due to her improved UOP that was cancelled as of this morning.    I introduced Palliative Medicine as specialized medical care for people living with  serious illness. It focuses on providing relief from the symptoms and stress of a serious illness. The goal is to improve quality of life for both the patient and the family.  Libia shared that she is from [place] originally. She has two daughters, [name] and [name]. She has not been married. 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 She considers herself of faith but does not vocalized a specific denomination or practice.  In terms of what [name] can do for herself, she lives with her eldest daughter [name] who helps her with her bADLs. She use to be able to walk a few feet with a rollator, but that has been damaged in a move. Tahtiana has a hospital bed in the home though the space it is placed in is quite limited. She shares that she often has fear of not waking up due to her severe sleep apnea. About a decade ago she had a CPAP device which helped this though that also got broken in a move. Of all present matters this appears to be the most distressing as it precludes her ability to get a restful nights sleep and what she attributes  recurrent hospitalizations   to.   A detailed discussion was had today regarding advanced directives, she presently does not have any on file. A consultation will be placed for the Chaplain to provide her with this paperwork, ideally to solidify this prior to discharge.  Concepts specific to code status, artifical feeding and hydration, continued IV antibiotics and rehospitalization was had.  The difference between a aggressive medical intervention path  and a palliative comfort care path for this patient at this time was had. At the present time [name] does not feel like she is ready to make any of these decisions given her recent improvements. She vocalized wanting to speak with her daughter, Terrence Dupont about it as well prior to making any decisions. She does seem to be aware that in general if she were to undergo a code that the outcomes would  be less than favorable.   I asked her if she may be willing to have a Palliative care provider follow up with her upon discharge which she also wanted to think about an discuss with her family.   Discussed with patient the importance of continued conversation with family and their  medical providers regarding overall plan of care and treatment options, ensuring decisions are within the context of the patients values and GOCs.  We will continue to see Shaynah and offer her support during her hospitalization.   Decision Maker Patient can make decisions for herself though relies heavily on the input of her daughters. She does not have an advanced directive.   SUMMARY OF RECOMMENDATIONS   Chaplain --> Help patient in filling out advanced directive  TOC --> CM to help identify patients CPAP eligibility, she underwent a sleep study in September  Continue to have ongoing [place] discussions regarding chronic conditions  Introduced the idea of a Palliative provider following up when patient goes home which she would like to discuss with her family  Code Status/Advance Care Planning:  Full code   Symptom Management:   Per primary  Palliative Prophylaxis:   Aspiration, Bowel Regimen, Delirium Protocol, Eye Care, Frequent Pain Assessment, Oral Care, Palliative Wound Care and Turn Reposition  Additional Recommendations (Limitations, Scope, Preferences):  Full Scope Treatment  Psycho-social/Spiritual:   Desire for further Chaplaincy support: Yes  Additional Recommendations: Caregiving  Support/Resources  Prognosis:   Unable to determine  Discharge Planning: To Be Determined      Primary Diagnoses: Present on Admission: . ARF (acute renal failure) ([place]) . Essential hypertension . CKD (chronic kidney disease), stage III . Acute on chronic systolic congestive heart failure ([place])   I have reviewed the medical record, interviewed the patient and family, and examined the  patient. The following aspects are pertinent.  Past Medical History:  Diagnosis Date  . Arthritis    "hands, arms, feet, back, knees" (02/17/2018)  . Cardiomyopathy [facility])    From Dr. Bonney Roussel office notes from 01/13/18  . CHF (congestive heart failure) ([place])   . Chronic lower back pain   . Diabetic neuropathy ([place])   . Family history of adverse reaction to anesthesia    Mother is hard to arouse and blood pressure drops  . GERD (gastroesophageal reflux disease)   . High cholesterol   . Hypertension   . Sleep apnea    "need new machine"  - does not use a cpap (02/17/2018)  . Type II diabetes mellitus ([place])    no longer on medications (02/17/2018)   Social History   Socioeconomic History  . Marital status: Widowed  Spouse name: Not on file  . Number of children: 2  . Years of education: Not on file  . Highest education level: Not on file  Occupational History  . Not on file  Tobacco Use  . Smoking status: Current Every Day Smoker    Packs/day: 0.50    Years: 32.00    Pack years: 16.00    Types: Cigarettes  . Smokeless tobacco: Never Used  Substance and Sexual Activity  . Alcohol use: Not Currently  . Drug use: Not Currently    Types: Marijuana  . Sexual activity: Not Currently  Other Topics Concern  . Not on file  Social History Narrative  . Not on file   Social Determinants of Health   Financial Resource Strain:   . Difficulty of Paying Living Expenses: Not on file  Food Insecurity: Food Insecurity Present  . Worried About Running Out of Food in the Last Year: Sometimes true  . Ran Out of Food in the Last Year: Not on file  Transportation Needs: No Transportation Needs  . Lack of Transportation (Medical): No  . Lack of Transportation (Non-Medical): No  Physical Activity:   . Days of Exercise per Week: Not on file  . Minutes of Exercise per Session: Not on file  Stress:   . Feeling of Stress : Not on file  Social Connections:   . Frequency of Communication  with Friends and Family: Not on file  . Frequency of Social Gatherings with Friends and Family: Not on file  . Attends Religious Services: Not on file  . Active Member of Clubs or Organizations: Not on file  . Attends Club or Organization Meetings: Not on file  . Marital Status: Not on file   Family History  Problem Relation Age of Onset  . Kidney disease Mother   . Congestive Heart Failure Mother   . Hypertension Father    Scheduled Meds: . allopurinol  300 mg Oral Daily  . amLODipine  5 mg Oral Daily  . [START ON 11/12/2019] buprenorphine  1 patch Transdermal Q Sat  . Chlorhexidine Gluconate Cloth  6 each Topical Daily  . heparin  5,000 Units Subcutaneous Q8H  . insulin aspart  0-6 Units Subcutaneous TID WC  . isosorbide-hydrALAZINE  1 tablet Oral TID  . metoprolol succinate  25 mg Oral Daily  . oxyCODONE  20 mg Oral Q12H  . patiromer  16.8 g Oral Daily   Continuous Infusions: . cefTRIAXone (ROCEPHIN)  IV 1 g (11/11/19 1135)   PRN Meds:.acetaminophen, ALPRAZolam, oxyCODONE-acetaminophen, polyethylene glycol, senna-docusate Medications Prior to Admission:  Prior to Admission medications   Medication Sig Start Date End Date Taking? Authorizing Provider  albuterol (PROVENTIL) (2.5 MG/3ML) 0.083% nebulizer solution Take 2.5 mg by nebulization every 4 (four) hours as needed for wheezing.  08/22/19  Yes [provider]  allopurinol (ZYLOPRIM) 300 MG tablet Take 300 mg by mouth daily.  02/03/19  Yes [provider]  ALPRAZolam (XANAX) 0.25 MG tablet Take 0.25 mg by mouth 2 (two) times daily as needed for anxiety.  02/03/19  Yes [provider]  amLODipine (NORVASC) 5 MG tablet Take 1 tablet (5 mg total) by mouth daily. 10/24/19 01/22/20 Yes Kelley, Ashton Haynes, NP  buprenorphine (BUTRANS) 20 MCG/HR PTWK patch Place 1 patch onto the skin every Saturday.  08/23/19  Yes [provider]  furosemide (LASIX) 40 MG tablet Take 1 tablet (40 mg total) by mouth 2  (two) times daily. 09/19/19 09/18/20 Yes Vann, Jessica U,   DO  gabapentin (NEURONTIN) 100 MG capsule Take 100 mg by mouth at bedtime as needed (pain).  08/22/19  Yes [provider]  isosorbide-hydrALAZINE (BIDIL) 20-37.5 MG tablet Take 2 tablets by mouth 3 (three) times daily. 09/26/19  Yes Miquel Dunn, NP  linagliptin (TRADJENTA) 5 MG TABS tablet Take 1 tablet (5 mg total) by mouth daily. 09/20/19  Yes Vann, Jessica U, DO  metoprolol succinate (TOPROL-XL) 25 MG 24 hr tablet TAKE 1 TABLET(25 MG) BY MOUTH DAILY WITH OR IMMEDIATELY FOLLOWING A MEAL Patient taking differently: Take 25 mg by mouth daily.  10/21/19  Yes Miquel Dunn, NP  [facility] 4 MG/0.1ML LIQD nasal spray kit Place 0.4 mg into the nose as needed (for overdose).  12/28/18  Yes [provider]  oxyCODONE-acetaminophen (PERCOCET) 10-325 MG tablet Take 1 tablet by mouth every 8 (eight) hours as needed for pain. Patient taking differently: Take 1 tablet by mouth in the morning, at noon, in the evening, and at bedtime.  09/30/18  Yes Rayburn, Neta Mends, PA-C  OZEMPIC, 0.25 OR 0.5 MG/DOSE, 2 MG/1.5ML SOPN Inject 0.25 mg into the skin every Wednesday. 10/26/19  Yes [provider]  Vitamin D, Ergocalciferol, (DRISDOL) 1.25 MG (50000 UT) CAPS capsule Take 50,000 Units by mouth every Tuesday.  07/26/19  Yes [provider]  XTAMPZA ER 36 MG C12A Take 36 mg by mouth 2 (two) times daily.  08/25/19  Yes [provider]   No Known Allergies Review of Systems  Constitutional: Positive for activity change and fatigue.  Respiratory: Positive for apnea and shortness of breath.   Genitourinary: Positive for decreased urine volume.   Physical Exam Vitals and nursing note reviewed.  HENT:     Head: Normocephalic.     Nose: Nose normal.     Mouth/Throat:     Mouth: Mucous membranes are dry.  Eyes:     Pupils: Pupils are equal, round, and reactive to light.  Cardiovascular:     Rate and  Rhythm: Normal rate and regular rhythm.     Pulses: Normal pulses.  Pulmonary:     Comments: 3LPM [place] Abdominal:     Palpations: Abdomen is soft.  Musculoskeletal:     Cervical back: Normal range of motion.  Skin:    General: Skin is dry.     Capillary Refill: Capillary refill takes less than 2 seconds.  Neurological:     Mental Status: She is alert and oriented to person, place, and time.  Psychiatric:        Mood and Affect: Mood normal.    Vital Signs: BP 103/67 (BP Location: Right Wrist)   Pulse 89   Temp 98.4 F (36.9 C) (Oral)   Resp (!) 21   Ht 5' 3" (1.6 m)   Wt (!) 236.3 kg   LMP 11/09/2012   SpO2 96%   BMI 92.29 kg/m  Pain Scale: 0-10   Pain Score: 0-No pain  SpO2: SpO2: 96 % O2 Device:SpO2: 96 % O2 Flow Rate: .O2 Flow Rate (L/min): 3 L/min  IO: Intake/output summary:   Intake/Output Summary (Last 24 hours) at 11/11/2019 1706 Last data filed at 11/11/2019 1624 Gross per 24 hour  Intake 1028.27 ml  Output 1076 ml  Net -47.73 ml   LBM: Last BM Date: 11/10/19 Baseline Weight: Weight: (!) 217.7 kg Most recent weight: Weight: (!) 236.3 kg     Palliative Assessment/Data: 30%   Time In: 1630 Time Out:1740 Time Total: 70 Greater than 50%  of this time was spent counseling and coordinating care related to the above assessment and plan.  Signed by: Rosezella Rumpf, NP   Please contact Palliative Medicine Team phone at [phone] for questions and concerns.  For individual provider: See Shea Evans

## 2019-11-11 NOTE — Progress Notes (Signed)
PROGRESS NOTE    [name]  BHA:[id number] DOB: 26-Jan-1966 DOA: 11/07/2019 PCP: Vonna Drafts, FNP  Brief Narrative:  54 year old lady with prior history of chronic systolic heart failure, diabetes mellitus, morbid obesity, chronic kidney disease was brought in for bilateral lower extremity edema and worsening back pain due to a fall from the stretcher 2 weeks ago.  On arrival to ED she was found to be in acute kidney injury and acute on chronic systolic heart failure. Nephrology consulted, she was initially scheduled for HD but was cancelled.   Assessment & Plan:   Principal Problem:   ARF (acute renal failure) (HCC) Active Problems:   Essential hypertension   Diabetes mellitus ([place])   Acute on chronic systolic congestive heart failure (HCC)   CKD (chronic kidney disease), stage III   Hyperkalemia   Acute on chronic stage IV kidney disease with hyperkalemia, oliguric CT of the abdomen ruled out obstruction.  Probably cardiorenal syndrome from CHF. Aggressive diuresis with Lasix 120 mg IV  as per nephrology. Veltassa for hyperkalemia. Resolved.  Avoid nephrotoxins and monitor renal parameters and urine output. Creatinine at 3.88 and potassium around 4 this am.    Acute respiratory failure with hypoxia requiring 2 L of nasal cannula oxygen Acute on chronic systolic and diastolic heart failure heart failure/nonischemic cardiomyopathy Last echo cardiogram on 09/12/2019 showed ejection fraction of 35 to 40%, global hypokinesis, grade 1 diastolic dysfunction. Cardiology on board and appreciate recommendations. Continue with aggressive diuresis with IV Lasix gtt.  Daily weights and strict intake and output. Appreciate cardiology input. Minimal urine output even with lasix.   Acute  Metabolic Encephalopathy: Suspect from ESRD and uremia.  Appears to have improved.  She is alert and oriented and answering questions appropriately.   Morbid obesity:  Body mass index is  92.29 kg/m.   Elevated troponins Probably secondary to demand ischemia from acute CHF.  Myoclonic jerks with some tremors possibly secondary to uremia.  Gabapentin on hold since admission Much improved today.    Neck pain CT of the cervical spine was done on 2/23 and it shows 1. No acute osseous abnormality identified in the cervical spine. Solid C4-C7 anterior fusion. Pain control  Back pain CT of the lumbar spine on admission shows No acute osseous abnormality identified in the lumbar spine. Mid and lower lumbar disc degeneration and advanced facet arthrosis with chronic severe spinal stenosis at L3-4 and at least moderate spinal stenosis at L4-5. Severe left neural foraminal stenosis at L5-S1. Pain control and PT evaluation. PT evaluation recommending SNF.    Diabetes mellitus CBG (last 3)  Recent Labs    11/11/19 0556 11/11/19 1138 11/11/19 1621  GLUCAP 153* 151* 150*   Continue with SSI.    Abnormal UA/UTI Urine cultures show multiple bacteria, DC Rocephin after tomorrow's dose.   Essential hypertension BP well controlled.  continue with Norvasc, BiDil .  Pressure injury present on admission Pressure Injury 11/12/17 Stage II -  Partial thickness loss of dermis presenting as a shallow open ulcer with a red, pink wound bed without slough. 2x1 cm,located in the lower L buttock (Active)  11/12/17 1045  Location: Buttocks  Location Orientation: Left  Staging: Stage II -  Partial thickness loss of dermis presenting as a shallow open ulcer with a red, pink wound bed without slough.  Wound Description (Comments): 2x1 cm,located in the lower L buttock  Present on Admission: Yes     Wound care consulted   In view of  the above, AKI, CHF, NICM, poor functional status, poor progression, morbid obesity, poor prognosis from the above medical issues will request for palliative care consult for goals of care.   DVT prophylaxis: (Heparin subcu 3 times daily Code Status: Full  code Family Communication: Discussed with daughter over the phone. On 2/25/   disposition Plan:  . Patient is from home, therapy eval is pending patient not stable for discharge yet evaluation for acute kidney injury , work up  Progress, and  When stable, we Possible d/c to SNF.    Consultants:   Nephrology  cadiology  Procedures: CT of the abdomen without contrast CT of the cervical spine and lumbar spine without contrast  Antimicrobials: Rocephin for UTI  Subjective: Alert and oriented today.   In good spirits, looking forward to improvement.  No chest pain. Breathing the same.   Objective: Vitals:   11/11/19 0002 11/11/19 0006 11/11/19 0609 11/11/19 1137  BP: 122/67 122/67 118/60 103/67  Pulse: 95 96 88 89  Resp: 20 18 18  (!) 21  Temp:  98.3 F (36.8 C) 99.1 F (37.3 C) 98.4 F (36.9 C)  TempSrc:  Oral Oral Oral  SpO2: 97% 99% 100% 96%  Weight:   (!) 236.3 kg   Height:        Intake/Output Summary (Last 24 hours) at 11/11/2019 1708 Last data filed at 11/11/2019 1624 Gross per 24 hour  Intake 1028.27 ml  Output 1076 ml  Net -47.73 ml   Filed Weights   11/09/19 0617 11/10/19 0620 11/11/19 0609  Weight: (!) 215.9 kg (!) 239 kg (!) 236.3 kg    Examination:  General exam: Morbidly obese lady, not in distress.  Respiratory system: decreased air entry at bases, no wheezing or rhonchi.  Cardiovascular system: S1S2 HEARD, no JVD, leg edema present.  Gastrointestinal system: abd is soft, non tender non distended, bowel sounds.  Central nervous system: alert and answering questions appropriately, no myoclonic jerks present.  Extremities: bil leg edema.  Skin: left buttock stage 2 pressure injury.  Psychiatry: mood is appropriate.     Data Reviewed: I have personally reviewed following labs and imaging studies  CBC: Recent Labs  Lab 11/07/19 2145 11/08/19 0459 11/09/19 0408 11/10/19 0519 11/11/19 0523  WBC 5.8 5.4 5.4 5.4 5.4  NEUTROABS 4.6 4.1  --   --    --   HGB 10.3* 10.0* 9.7* 9.5* 9.3*  HCT 35.6* 34.4* 33.4* 31.5* 30.4*  MCV 95.4 93.5 93.8 92.4 89.9  PLT 214 196 206 180 924   Basic Metabolic Panel: Recent Labs  Lab 11/09/19 0408 11/10/19 0519 11/10/19 1457 11/10/19 1942 11/11/19 0523  NA 134* 133* 135 135 135  K 5.9* 5.7* 6.6* 5.3* 4.4  CL 98 97* 98 97* 97*  CO2 24 23 21* 25 26  GLUCOSE 183* 188* 184* 178* 140*  BUN 80* 85* 91* 87* 87*  CREATININE 3.76* 3.83* 4.07* 3.98* 3.88*  CALCIUM 8.9 8.9 8.9 9.0 8.9  MG 2.3 2.5*  --   --  2.3  PHOS  --   --  5.8*  --   --    GFR: Estimated Creatinine Clearance: 33.7 mL/min (A) (by C-G formula based on SCr of 3.88 mg/dL (H)). Liver Function Tests: Recent Labs  Lab 11/07/19 2145 11/08/19 0459 11/10/19 1457  AST 23 21  --   ALT 15 16  --   ALKPHOS 82 83  --   BILITOT 1.4* 1.3*  --   PROT 9.3* 9.0*  --  ALBUMIN 3.6 3.6 3.4*   No results for input(s): LIPASE, AMYLASE in the last 168 hours. No results for input(s): AMMONIA in the last 168 hours. Coagulation Profile: No results for input(s): INR, PROTIME in the last 168 hours. Cardiac Enzymes: No results for input(s): CKTOTAL, CKMB, CKMBINDEX, TROPONINI in the last 168 hours. BNP (last 3 results) No results for input(s): PROBNP in the last 8760 hours. HbA1C: No results for input(s): HGBA1C in the last 72 hours. CBG: Recent Labs  Lab 11/10/19 1623 11/10/19 2030 11/11/19 0556 11/11/19 1138 11/11/19 1621  GLUCAP 163* 169* 153* 151* 150*   Lipid Profile: No results for input(s): CHOL, HDL, LDLCALC, TRIG, CHOLHDL, LDLDIRECT in the last 72 hours. Thyroid Function Tests: No results for input(s): TSH, T4TOTAL, FREET4, T3FREE, THYROIDAB in the last 72 hours. Anemia Panel: No results for input(s): VITAMINB12, FOLATE, FERRITIN, TIBC, IRON, RETICCTPCT in the last 72 hours. Sepsis Labs: No results for input(s): PROCALCITON, LATICACIDVEN in the last 168 hours.  Recent Results (from the past 240 hour(s))  SARS CORONAVIRUS  2 (TAT 6-24 HRS) Nasopharyngeal Nasopharyngeal Swab     Status: None   Collection Time: 11/07/19 11:10 PM   Specimen: Nasopharyngeal Swab  Result Value Ref Range Status   SARS Coronavirus 2 NEGATIVE NEGATIVE Final    Comment: (NOTE) SARS-CoV-2 target nucleic acids are NOT DETECTED. The SARS-CoV-2 RNA is generally detectable in upper and lower respiratory specimens during the acute phase of infection. Negative results do not preclude SARS-CoV-2 infection, do not rule out co-infections with other pathogens, and should not be used as the sole basis for treatment or other patient management decisions. Negative results must be combined with clinical observations, patient history, and epidemiological information. The expected result is Negative. Fact Sheet for Patients: SugarRoll.be Fact Sheet for Healthcare Providers: https://www.woods-mathews.com/ This test is not yet approved or cleared by the [place] FDA and  has been authorized for detection and/or diagnosis of SARS-CoV-2 by FDA under an Emergency Use Authorization (EUA). This EUA will remain  in effect (meaning this test can be used) for the duration of the COVID-19 declaration under Section 56 4(b)(1) of the Act, 21 U.S.C. section 360bbb-3(b)(1), unless the authorization is terminated or revoked sooner. Performed at [facility], [place] [address], [place], [place] [address]   Culture, Urine     Status: Abnormal   Collection Time: 11/08/19  7:36 AM   Specimen: Urine, Random  Result Value Ref Range Status   Specimen Description URINE, RANDOM  Final   Special Requests   Final    NONE Performed at [facility], [place] [address], [place], [place] [address]    Culture MULTIPLE SPECIES PRESENT, SUGGEST RECOLLECTION (A)  Final   Report Status 11/09/2019 FINAL  Final         Radiology Studies: No results found.      Scheduled Meds: . allopurinol  300 mg Oral Daily  .  amLODipine  5 mg Oral Daily  . [START ON 11/12/2019] buprenorphine  1 patch Transdermal Q Sat  . Chlorhexidine Gluconate Cloth  6 each Topical Daily  . heparin  5,000 Units Subcutaneous Q8H  . insulin aspart  0-6 Units Subcutaneous TID WC  . isosorbide-hydrALAZINE  1 tablet Oral TID  . metoprolol succinate  25 mg Oral Daily  . oxyCODONE  20 mg Oral Q12H  . patiromer  16.8 g Oral Daily   Continuous Infusions: . cefTRIAXone (ROCEPHIN)  IV 1 g (11/11/19 1135)  LOS: 4 days        Hosie Poisson, MD [facility]   To contact the attending provider between 7A-7P or the covering provider during after hours 7P-7A, please log into the web site [email or web address] and access using universal [place] password for that web site. If you do not have the password, please call the hospital operator.  11/11/2019, 5:08 PM

## 2019-11-11 NOTE — Progress Notes (Signed)
Physical Therapy Treatment Patient Details Name: [name] MRN: [id number] DOB: 1966/07/11 Today's Date: 11/11/2019    History of Present Illness Pt is a 54 y/o female with PMH including but not limited to chronic systolic heart failure, diabetes mellitus, morbid obesity, chronic kidney disease was brought in for bilateral lower extremity edema and worsening back pain due to a fall from the stretcher 2 weeks ago.  On arrival to ED she was found to be in acute kidney injury and acute on chronic systolic heart failure.    PT Comments    Patient seen for mobility progression. This session focused on bed mobility. Will need maxi sky hoyer lift for safety with OOB transfers. Current plan remains appropriate.    Follow Up Recommendations  SNF     Equipment Recommendations  None recommended by PT    Recommendations for Other Services       Precautions / Restrictions Precautions Precautions: Fall Precaution Comments: 476 lbs last weight Restrictions Weight Bearing Restrictions: No    Mobility  Bed Mobility Overal bed mobility: Needs Assistance Bed Mobility: Rolling           General bed mobility comments: attempted to roll in bed with use of bed rails and bed pads; cues for sequencing and for use of bed rails; pt is able to reach across midline and use bed rail to rotate trunk however unable to get on side due to inability to flex knees or lift hips from bed surface   Transfers                    Ambulation/Gait                 Stairs             Wheelchair Mobility    Modified Rankin (Stroke Patients Only)       Balance                                            Cognition Arousal/Alertness: Awake/alert Behavior During Therapy: Flat affect Overall Cognitive Status: No family/caregiver present to determine baseline cognitive functioning Area of Impairment: Following commands;Safety/judgement;Awareness;Problem solving                       Following Commands: Follows one step commands inconsistently;Follows one step commands with increased time Safety/Judgement: Decreased awareness of deficits;Decreased awareness of safety   Problem Solving: Slow processing;Decreased initiation;Difficulty sequencing;Requires verbal cues;Requires tactile cues        Exercises General Exercises - Lower Extremity Ankle Circles/Pumps: AROM;AAROM;Both Straight Leg Raises: AROM;Both;5 reps    General Comments        Pertinent Vitals/Pain Pain Assessment: Faces Faces Pain Scale: No hurt    Home Living                      Prior Function            PT Goals (current goals can now be found in the care plan section) Acute Rehab PT Goals Patient Stated Goal: go to rehab Progress towards PT goals: Progressing toward goals    Frequency    Min 2X/week      PT Plan Current plan remains appropriate    Co-evaluation              AM-PAC PT "6  Clicks" Mobility   Outcome Measure  Help needed turning from your back to your side while in a flat bed without using bedrails?: Total Help needed moving from lying on your back to sitting on the side of a flat bed without using bedrails?: Total Help needed moving to and from a bed to a chair (including a wheelchair)?: Total Help needed standing up from a chair using your arms (e.g., wheelchair or bedside chair)?: Total Help needed to walk in hospital room?: Total Help needed climbing 3-5 steps with a railing? : Total 6 Click Score: 6    End of Session Equipment Utilized During Treatment: Oxygen Activity Tolerance: Patient tolerated treatment well Patient left: in bed;with call bell/phone within reach Nurse Communication: Mobility status PT Visit Diagnosis: Other abnormalities of gait and mobility (R26.89);Muscle weakness (generalized) (M62.81)     Time: 7124-5809 PT Time Calculation (min) (ACUTE ONLY): 22 min  Charges:  $Therapeutic  Activity: 8-22 mins                     Earney Navy, PTA Acute Rehabilitation Services Pager: [phone] Office: [phone]     Darliss Cheney 11/11/2019, 10:44 AM

## 2019-11-11 NOTE — Progress Notes (Signed)
[facility] Progress Note  Name: [name] MRN: [id number] DOB: [birth year]  Chief Complaint:  Pain after a fall and progressive swelling   Subjective:  Yesterday had minimal output on lasix gtt.  She received lasix 120 mg IV once last night.  She had agreed for placement of HD catheter and attempt HD today - IR was consulted.  Urine output ultimately increased to 675 overnight.  She had 1 liter UOP out over 2/25.  She feels better today.  Wore her CPAP last night.  She is excited about making more urine and wants to hold off on dialysis today.  Naps during day and willing to wear CPAP during naps.    Review of systems:  Denies any shortness of breath Denies cp  Denies n/v ------ Background on consult:  43F with extensive past medical history of morbid obesity, chronic systolic heart failure with recent exacerbation earlier this year, type 2 diabetes, very limited mobility secondary to habitus and OA/malfunction of left TKR, OSA, chronic pain on narcotics who was admitted on 2/23 presenting with intractable lumbar and cervical neck pain following a fall, increased myoclonus, and progressive peripheral edema.  Systolic heart failure due to nonischemic cardiomyopathy, she was admitted in January with decompensated heart failure, twice.  She has had progressive decline in renal function over the past 3 years, most recently with a creatinine between 1.8 and 2.0.  Presenting creatinine was 3.6, 3.76 today.  This has been complicated by hyperkalemia, currently requiring Lokelma 10 g twice daily.  Today's value is 5.9.  She is on Lasix 40 mg IV twice daily with very little urine output to date, she has a Foley catheter.  She is unable to obtain daily weights.  She does note significant progressive edema throughout the legs, and across the abdomen.  UA at presentation with 11-20 erythrocytes per high-powered field, significant pyuria, greater than 50 per high-power field, 2+ proteinuria.   Her urine sodium is less than 10.  CT of the abdomen and pelvis yesterday without evidence of hydronephrosis or obstruction.  She denies use of NSAIDs preceding admission.  No exposure to TMP/SMX.  No IV contrast.  She appears to be 7 to 10 kg above her preceding weights, if accurate.  Most recent TTE was 09/12/2019 and had an LVEF of 35 to 40%, mild LVH; grade 1 diastolic dysfunction. the exam was very limited.  Her main concern currently is her ongoing chronic pain.  It is unclear if she has exertional symptoms.  Her mother was a dialysis patient locally, who died in 12-26-17.   Intake/Output Summary (Last 24 hours) at 11/11/2019 0822 Last data filed at 11/11/2019 0641 Gross per 24 hour  Intake 590.27 ml  Output 977 ml  Net -386.73 ml    Vitals:  Vitals:   11/10/19 12/27/2102 11/11/19 0002 11/11/19 0006 11/11/19 0609  BP: 110/81 122/67 122/67 118/60  Pulse: 85 95 96 88  Resp: 20 20 18 18   Temp: 97.7 F (36.5 C)  98.3 F (36.8 C) 99.1 F (37.3 C)  TempSrc: Oral  Oral Oral  SpO2: 98% 97% 99% 100%  Weight:    (!) 236.3 kg  Height:         Physical Exam:   General adult female in bed in no acute distress. Obese body habitus  HEENT normocephalic atraumatic extraocular movements intact sclera anicteric Neck increased circumference  Lungs reduced on auscultation bilaterally normal work of breathing at rest  Heart S1S2 no rub Abdomen soft  obese habitus  Extremities bilateral lower extremity edema and abd wall edema  Psych normal mood and affect Neuro - alert and oriented x 3 and conversant  gu foley in place with some urine - just emptied.  Medications reviewed  Labs:  BMP Latest Ref Rng & Units 11/11/2019 11/10/2019 11/10/2019  Glucose 70 - 99 mg/dL 140(H) 178(H) 184(H)  BUN 6 - 20 mg/dL 87(H) 87(H) 91(H)  Creatinine 0.44 - 1.00 mg/dL 3.88(H) 3.98(H) 4.07(H)  Sodium 135 - 145 mmol/L 135 135 135  Potassium 3.5 - 5.1 mmol/L 4.4 5.3(H) 6.6(HH)  Chloride 98 - 111 mmol/L 97(L) 97(L) 98  CO2  22 - 32 mmol/L 26 25 21(L)  Calcium 8.9 - 10.3 mg/dL 8.9 9.0 8.9     Assessment/Plan:   5F with AoCKD3, decompensated systolic CHF, morbid obesity with limited mobility, chronic pain, mild hyperkalemia  1. AoCKD3: No obstruction, no nephrotoxic exposures, absent urine sodium suggests cardiorenal syndrome which is consistent with clinical picture.  1. She is high risk of progression to dialysis dependence which would be very difficult given comorbidities  2. Hold off on IR consult for catheter placement today - I have cancelled the consult.   3. Would continue foley   4. Cancelled order for HD today and called the unit  5. Lasix 120 mg IV once this AM and redose later this afternoon - ordered 2. Hyperkalemia - was switched to veltassa for managment  3. Acute on chronic decompensated systolic heart failure, nonischemic. Diuretic titration as above   Discussed wearing CPAP at night and for naps, too 4. CKD stage III - most recently with a creatinine between 1.8 and 2.0. 5. Super morbid obesity - dialysis would be very difficult in the setting of supermorbid obesity 6. Limited mobility - secondary to above 7. Chronic pain, on longstanding narcotics 8. DM2 - per primary team  9. OSA - per -primary team  10. Anemia, normocytic; features of iron deficiency 09/16/19.  feraheme x 1 dose on 2/26.   Claudia Desanctis, MD 11/11/2019 8:46 AM

## 2019-11-11 NOTE — Progress Notes (Signed)
Pt will need c-pap at home, she has had trouble getting  one at home, please address Arvella Nigh RN.

## 2019-11-12 LAB — BASIC METABOLIC PANEL
Anion gap: 10 (ref 5–15)
BUN: 87 mg/dL — ABNORMAL HIGH (ref 6–20)
CO2: 27 mmol/L (ref 22–32)
Calcium: 8.8 mg/dL — ABNORMAL LOW (ref 8.9–10.3)
Chloride: 98 mmol/L (ref 98–111)
Creatinine, Ser: 3.79 mg/dL — ABNORMAL HIGH (ref 0.44–1.00)
GFR calc Af Amer: 15 mL/min — ABNORMAL LOW (ref >60)
GFR calc non Af Amer: 13 mL/min — ABNORMAL LOW (ref >60)
Glucose, Bld: 155 mg/dL — ABNORMAL HIGH (ref 70–99)
Potassium: 4 mmol/L (ref 3.5–5.1)
Sodium: 135 mmol/L (ref 135–145)

## 2019-11-12 LAB — CBC
HCT: 29.9 % — ABNORMAL LOW (ref 36.0–46.0)
Hemoglobin: 9 g/dL — ABNORMAL LOW (ref 12.0–15.0)
MCH: 27 pg (ref 26.0–34.0)
MCHC: 30.1 g/dL (ref 30.0–36.0)
MCV: 89.8 fL (ref 80.0–100.0)
Platelets: 178 10*3/uL (ref 150–400)
RBC: 3.33 MIL/uL — ABNORMAL LOW (ref 3.87–5.11)
RDW: 16.6 % — ABNORMAL HIGH (ref 11.5–15.5)
WBC: 6.5 10*3/uL (ref 4.0–10.5)
nRBC: 0.3 % — ABNORMAL HIGH (ref 0.0–0.2)

## 2019-11-12 LAB — GLUCOSE, CAPILLARY
Glucose-Capillary: 150 mg/dL — ABNORMAL HIGH (ref 70–99)
Glucose-Capillary: 144 mg/dL — ABNORMAL HIGH (ref 70–99)
Glucose-Capillary: 171 mg/dL — ABNORMAL HIGH (ref 70–99)
Glucose-Capillary: 158 mg/dL — ABNORMAL HIGH (ref 70–99)

## 2019-11-12 LAB — MAGNESIUM: Magnesium: 2.3 mg/dL (ref 1.7–2.4)

## 2019-11-12 LAB — PHOSPHORUS: Phosphorus: 5.2 mg/dL — ABNORMAL HIGH (ref 2.5–4.6)

## 2019-11-12 MED ORDER — TORSEMIDE 20 MG PO TABS
50.0000 mg | ORAL_TABLET | Freq: Once | ORAL | Status: AC
  Administered 2019-11-12: 17:00:00 50 mg via ORAL
  Filled 2019-11-12: qty 3

## 2019-11-12 MED ORDER — PATIROMER SORBITEX CALCIUM 8.4 G PO PACK
8.4000 g | PACK | ORAL | Status: DC
  Filled 2019-11-12: qty 1

## 2019-11-12 MED ORDER — FUROSEMIDE 10 MG/ML IJ SOLN
120.0000 mg | Freq: Once | INTRAVENOUS | Status: AC
  Administered 2019-11-12: 120 mg via INTRAVENOUS
  Filled 2019-11-12: qty 12
  Filled 2019-11-12: qty 10

## 2019-11-12 MED ORDER — METOLAZONE 5 MG PO TABS
5.0000 mg | ORAL_TABLET | ORAL | Status: AC
  Administered 2019-11-12: 5 mg via ORAL
  Filled 2019-11-12: qty 1

## 2019-11-12 NOTE — Progress Notes (Signed)
[facility] Progress Note  Name: [name] MRN: [id number] DOB: [birth year]  Chief Complaint:  Pain after a fall and progressive swelling   Subjective:  She had 1 liter out over 2/26.  Naps during day and willing to wear CPAP during naps.  States family has stated her speech is clearer now and they don't feel like she's getting short of breath while talking to them on the phone.  She feels better today.  Abdomen feels less swollen  Review of systems:   shortness of breath has gotten better Denies cp  Denies n/v ------ Background on consult:  53F with extensive past medical history of morbid obesity, chronic systolic heart failure with recent exacerbation earlier this year, type 2 diabetes, very limited mobility secondary to habitus and OA/malfunction of left TKR, OSA, chronic pain on narcotics who was admitted on 2/23 presenting with intractable lumbar and cervical neck pain following a fall, increased myoclonus, and progressive peripheral edema.  Systolic heart failure due to nonischemic cardiomyopathy, she was admitted in January with decompensated heart failure, twice.  She has had progressive decline in renal function over the past 3 years, most recently with a creatinine between 1.8 and 2.0.  Presenting creatinine was 3.6, 3.76 today.  This has been complicated by hyperkalemia, currently requiring Lokelma 10 g twice daily.  Today's value is 5.9.  She is on Lasix 40 mg IV twice daily with very little urine output to date, she has a Foley catheter.  She is unable to obtain daily weights.  She does note significant progressive edema throughout the legs, and across the abdomen.  UA at presentation with 11-20 erythrocytes per high-powered field, significant pyuria, greater than 50 per high-power field, 2+ proteinuria.  Her urine sodium is less than 10.  CT of the abdomen and pelvis yesterday without evidence of hydronephrosis or obstruction.  She denies use of NSAIDs preceding  admission.  No exposure to TMP/SMX.  No IV contrast.  She appears to be 7 to 10 kg above her preceding weights, if accurate.  Most recent TTE was 09/12/2019 and had an LVEF of 35 to 40%, mild LVH; grade 1 diastolic dysfunction. the exam was very limited.  Her main concern currently is her ongoing chronic pain.  It is unclear if she has exertional symptoms.  Her mother was a dialysis patient locally, who died in 2017/12/31.   Intake/Output Summary (Last 24 hours) at 11/12/2019 0850 Last data filed at 11/12/2019 0600 Gross per 24 hour  Intake 1202 ml  Output 950 ml  Net 252 ml    Vitals:  Vitals:   11/11/19 0609 11/11/19 1137 11/11/19 12/31/32 11/12/19 0608  BP: 118/60 103/67 (!) 104/56 128/63  Pulse: 88 89 81 73  Resp: 18 (!) 21 14 16   Temp: 99.1 F (37.3 C) 98.4 F (36.9 C)  98 F (36.7 C)  TempSrc: Oral Oral    SpO2: 100% 96% 99% 91%  Weight: (!) 236.3 kg   (!) 235.9 kg  Height:         Physical Exam:   General adult female in bed in no acute distress. Wearing CPAP. Obese body habitus  HEENT normocephalic atraumatic extraocular movements intact sclera anicteric Neck increased circumference  Lungs reduced on auscultation bilaterally normal work of breathing at rest  Heart S1S2 no rub Abdomen soft obese habitus  Extremities hard bilateral lower extremity edema and abd wall edema  Psych normal mood and affect Neuro - alert and oriented x 3 and conversant  gu foley in place with urine noted  Medications reviewed    Labs:  BMP Latest Ref Rng & Units 11/12/2019 11/11/2019 11/10/2019  Glucose 70 - 99 mg/dL 155(H) 140(H) 178(H)  BUN 6 - 20 mg/dL 87(H) 87(H) 87(H)  Creatinine 0.44 - 1.00 mg/dL 3.79(H) 3.88(H) 3.98(H)  Sodium 135 - 145 mmol/L 135 135 135  Potassium 3.5 - 5.1 mmol/L 4.0 4.4 5.3(H)  Chloride 98 - 111 mmol/L 98 97(L) 97(L)  CO2 22 - 32 mmol/L 27 26 25   Calcium 8.9 - 10.3 mg/dL 8.8(L) 8.9 9.0     Assessment/Plan:   41F with AoCKD3, decompensated systolic CHF, morbid  obesity with limited mobility, chronic pain, mild hyperkalemia  1. AoCKD3: No obstruction, no nephrotoxic exposures, absent urine sodium suggests cardiorenal syndrome which is consistent with clinical picture.  She has not had good response to lasix 120 mg IV bolus doses or lasix gtt at 10 augmented with metolazone.  1. She is high risk of progression to dialysis dependence which would be very difficult given comorbidities  2. Would continue foley   3. Attempt metolazone and Torsemide today; reassess UOP later today to guide redosing 4. Discontinue amlodipine to avoid hypotension  5. Lift NPO order - renal diet with fluid restrictions.  No emergent need for HD and would be difficult as noted.  2. Hyperkalemia - was switched to veltassa for management.  Can space to every other day for now - modified 3. Acute on chronic decompensated systolic heart failure, nonischemic. Diuretic titration as above   Discussed wearing CPAP at night and for naps, too 4. CKD stage III - most recently with a creatinine between 1.8 and 2.0. 5. Super morbid obesity - dialysis would be very difficult in the setting of supermorbid obesity 6. Limited mobility - secondary to above 7. Chronic pain, on longstanding narcotics 8. HTN - discontinued amlodipine  9. DM2 - per primary team  10. OSA - per -primary team  11. Anemia, normocytic; features of iron deficiency 09/16/19.  feraheme x 1 dose on 2/26.   Claudia Desanctis, MD 11/12/2019  9:19 AM

## 2019-11-12 NOTE — Progress Notes (Signed)
PROGRESS NOTE    GEOFFREY MANKIN  VZD:[id number] DOB: 19-Jul-1966 DOA: 11/07/2019 PCP: Vonna Drafts, FNP  Brief Narrative:  54 year old lady with prior history of chronic systolic heart failure, diabetes mellitus, morbid obesity, chronic kidney disease was brought in for bilateral lower extremity edema and worsening back pain due to a fall from the stretcher 2 weeks ago.  On arrival to ED she was found to be in acute kidney injury and acute on chronic systolic heart failure. Nephrology consulted, she was initially scheduled for HD but was cancelled.  She was started on high-dose IV Lasix 120 mg 3 times daily and Lasix gtt. without improvement in her renal parameters.  Nephrology plan to attempt metolazone and torsemide today and reassess urine output and renal parameters.  Assessment & Plan:   Principal Problem:   ARF (acute renal failure) (HCC) Active Problems:   Essential hypertension   Diabetes mellitus ([place])   Acute on chronic systolic congestive heart failure (HCC)   CKD (chronic kidney disease), stage III   Hyperkalemia   Palliative care by specialist   Goals of care, counseling/discussion   Acute on chronic stage IV kidney disease with hyperkalemia,  CT of the abdomen ruled out obstruction.  Probably cardiorenal syndrome from CHF. Patient did not diurese much with high-dose IV Lasix 120 mg 3 times daily and IV Lasix gtt.  Switch to metolazone and torsemide today and reassess renal parameters and urine output.  Appreciate nephrology recommendations.  High risk of progression to dialysis which will be very difficult in view of multiple medical comorbidities Veltassa for hyperkalemia. Resolved.  Avoid nephrotoxins and monitor renal parameters and urine output.   Acute respiratory failure with hypoxia requiring 2 L of nasal cannula oxygen Acute on chronic systolic and diastolic heart failure heart failure/nonischemic cardiomyopathy Last echo cardiogram on 09/12/2019 showed  ejection fraction of 35 to 40%, global hypokinesis, grade 1 diastolic dysfunction. Aggressive diuresis with metolazone and torsemide today to be attempted by nephrology. Daily weights and strict intake and output  And fluid restriction Appreciate cardiology input.   Acute  Metabolic Encephalopathy: Suspect from ESRD and uremia.  Appears to have resolved, patient is alert oriented and following commands and answering all questions appropriately  Morbid obesity:  Body mass index is 92.11 kg/m.   Obstructive sleep apnea On CPAP at night  Elevated troponins Probably secondary to demand ischemia from acute CHF.  Myoclonic jerks with some tremors possibly secondary to uremia.  Gabapentin on hold since admission None today   Neck pain CT of the cervical spine was done on 2/23 and it shows 1. No acute osseous abnormality identified in the cervical spine. Solid C4-C7 anterior fusion. Pain control  Back pain CT of the lumbar spine on admission shows No acute osseous abnormality identified in the lumbar spine. Mid and lower lumbar disc degeneration and advanced facet arthrosis with chronic severe spinal stenosis at L3-4 and at least moderate spinal stenosis at L4-5. Severe left neural foraminal stenosis at L5-S1. Pain control and PT evaluation. PT evaluation recommending SNF.    Diabetes mellitus CBG (last 3)  Recent Labs    11/12/19 0607 11/12/19 1130 11/12/19 1704  GLUCAP 150* 144* 171*   Continue with SSI.  No change in medications A1c is 8.8 Repeat hemoglobin A1c.   Abnormal UA/UTI Urine cultures show multiple bacteria, DC Rocephin after tomorrow's dose.   Essential hypertension BP well controlled.  Continue with beta  Pressure injury present on admission Pressure Injury 11/12/17 Stage II -  Partial thickness loss of dermis presenting as a shallow open ulcer with a red, pink wound bed without slough. 2x1 cm,located in the lower L buttock (Active)  11/12/17 1045    Location: Buttocks  Location Orientation: Left  Staging: Stage II -  Partial thickness loss of dermis presenting as a shallow open ulcer with a red, pink wound bed without slough.  Wound Description (Comments): 2x1 cm,located in the lower L buttock  Present on Admission: Yes     Wound care consulted   In view of the above, AKI, CHF, NICM, poor functional status, poor progression, morbid obesity, poor prognosis from the above medical issues will request for palliative care consult for goals of care.   DVT prophylaxis: (Heparin subcu 3 times daily Code Status: Full code Family Communication: Discussed with daughter over the phone. On 2/25   disposition Plan:  . Patient is from home, therapy eval is pending, patient not stable for discharge home yet AKI work-up still pending and waiting clinical improvement.   Consultants:   Nephrology  Cardiology   palliative care  Procedures: CT of the abdomen without contrast CT of the cervical spine and lumbar spine without contrast  Antimicrobials: Rocephin for UTI  Subjective: Patient is in good spirits reports the CPAP helped her breathing, no chest pain or shortness of breath, was disappointed that she has fluid restriction. Objective: Vitals:   11/11/19 1137 11/11/19 2034 11/12/19 0608 11/12/19 1133  BP: 103/67 (!) 104/56 128/63 (!) 106/54  Pulse: 89 81 73 79  Resp: (!) 21 14 16  (!) 21  Temp: 98.4 F (36.9 C)  98 F (36.7 C) 97.8 F (36.6 C)  TempSrc: Oral   Oral  SpO2: 96% 99% 91% (!) 84%  Weight:   (!) 235.9 kg   Height:        Intake/Output Summary (Last 24 hours) at 11/12/2019 1725 Last data filed at 11/12/2019 1700 Gross per 24 hour  Intake 542 ml  Output 825 ml  Net -283 ml   Filed Weights   11/10/19 0620 11/11/19 0609 11/12/19 6389  Weight: (!) 239 kg (!) 236.3 kg (!) 235.9 kg    Examination:  General exam: Morbidly obese lady, not in any kind of distress Respiratory system: Diminished air entry at bases,  no wheezing or rhonchi, tachypnea improved on talking Cardiovascular system: S1-S2 heard, regular rate rhythm, no JVD, bilateral leg edema present Gastrointestinal system: Abdomen is soft, nontender, nondistended, bowel sounds normal.  Central nervous system: Alert and oriented x4 and answering all questions appropriately and following commands.  No myoclonic jerks appreciated today Extremities bilateral leg edema present Skin: left buttock stage 2 pressure injury.  Psychiatry: Mood is appropriate    Data Reviewed: I have personally reviewed following labs and imaging studies  CBC: Recent Labs  Lab 11/07/19 2145 11/07/19 2145 11/08/19 0459 11/09/19 0408 11/10/19 0519 11/11/19 0523 11/12/19 0521  WBC 5.8   < > 5.4 5.4 5.4 5.4 6.5  NEUTROABS 4.6  --  4.1  --   --   --   --   HGB 10.3*   < > 10.0* 9.7* 9.5* 9.3* 9.0*  HCT 35.6*   < > 34.4* 33.4* 31.5* 30.4* 29.9*  MCV 95.4   < > 93.5 93.8 92.4 89.9 89.8  PLT 214   < > 196 206 180 179 178   < > = values in this interval not displayed.   Basic Metabolic Panel: Recent Labs  Lab 11/09/19 0408 11/09/19 0408 11/10/19 3734  11/10/19 1457 11/10/19 1942 11/11/19 0523 11/12/19 0521  NA 134*   < > 133* 135 135 135 135  K 5.9*   < > 5.7* 6.6* 5.3* 4.4 4.0  CL 98   < > 97* 98 97* 97* 98  CO2 24   < > 23 21* 25 26 27   GLUCOSE 183*   < > 188* 184* 178* 140* 155*  BUN 80*   < > 85* 91* 87* 87* 87*  CREATININE 3.76*   < > 3.83* 4.07* 3.98* 3.88* 3.79*  CALCIUM 8.9   < > 8.9 8.9 9.0 8.9 8.8*  MG 2.3  --  2.5*  --   --  2.3 2.3  PHOS  --   --   --  5.8*  --   --  5.2*   < > = values in this interval not displayed.   GFR: Estimated Creatinine Clearance: 34.5 mL/min (A) (by C-G formula based on SCr of 3.79 mg/dL (H)). Liver Function Tests: Recent Labs  Lab 11/07/19 2145 11/08/19 0459 11/10/19 1457  AST 23 21  --   ALT 15 16  --   ALKPHOS 82 83  --   BILITOT 1.4* 1.3*  --   PROT 9.3* 9.0*  --   ALBUMIN 3.6 3.6 3.4*   No  results for input(s): LIPASE, AMYLASE in the last 168 hours. No results for input(s): AMMONIA in the last 168 hours. Coagulation Profile: No results for input(s): INR, PROTIME in the last 168 hours. Cardiac Enzymes: No results for input(s): CKTOTAL, CKMB, CKMBINDEX, TROPONINI in the last 168 hours. BNP (last 3 results) No results for input(s): PROBNP in the last 8760 hours. HbA1C: No results for input(s): HGBA1C in the last 72 hours. CBG: Recent Labs  Lab 11/11/19 1621 11/11/19 2107 11/12/19 0607 11/12/19 1130 11/12/19 1704  GLUCAP 150* 160* 150* 144* 171*   Lipid Profile: No results for input(s): CHOL, HDL, LDLCALC, TRIG, CHOLHDL, LDLDIRECT in the last 72 hours. Thyroid Function Tests: No results for input(s): TSH, T4TOTAL, FREET4, T3FREE, THYROIDAB in the last 72 hours. Anemia Panel: No results for input(s): VITAMINB12, FOLATE, FERRITIN, TIBC, IRON, RETICCTPCT in the last 72 hours. Sepsis Labs: No results for input(s): PROCALCITON, LATICACIDVEN in the last 168 hours.  Recent Results (from the past 240 hour(s))  SARS CORONAVIRUS 2 (TAT 6-24 HRS) Nasopharyngeal Nasopharyngeal Swab     Status: None   Collection Time: 11/07/19 11:10 PM   Specimen: Nasopharyngeal Swab  Result Value Ref Range Status   SARS Coronavirus 2 NEGATIVE NEGATIVE Final    Comment: (NOTE) SARS-CoV-2 target nucleic acids are NOT DETECTED. The SARS-CoV-2 RNA is generally detectable in upper and lower respiratory specimens during the acute phase of infection. Negative results do not preclude SARS-CoV-2 infection, do not rule out co-infections with other pathogens, and should not be used as the sole basis for treatment or other patient management decisions. Negative results must be combined with clinical observations, patient history, and epidemiological information. The expected result is Negative. Fact Sheet for Patients: SugarRoll.be Fact Sheet for Healthcare  Providers: https://www.woods-mathews.com/ This test is not yet approved or cleared by the [place] FDA and  has been authorized for detection and/or diagnosis of SARS-CoV-2 by FDA under an Emergency Use Authorization (EUA). This EUA will remain  in effect (meaning this test can be used) for the duration of the COVID-19 declaration under Section 56 4(b)(1) of the Act, 21 U.S.C. section 360bbb-3(b)(1), unless the authorization is terminated or revoked sooner. Performed at  [facility], [place] [address], [place], [place] [address]   Culture, Urine     Status: Abnormal   Collection Time: 11/08/19  7:36 AM   Specimen: Urine, Random  Result Value Ref Range Status   Specimen Description URINE, RANDOM  Final   Special Requests   Final    NONE Performed at [facility], [place] [address] [place], [place] [address]    Culture MULTIPLE SPECIES PRESENT, SUGGEST RECOLLECTION (A)  Final   Report Status 11/09/2019 FINAL  Final         Radiology Studies: No results found.      Scheduled Meds: . allopurinol  300 mg Oral Daily  . buprenorphine  1 patch Transdermal Q Sat  . Chlorhexidine Gluconate Cloth  6 each Topical Daily  . heparin  5,000 Units Subcutaneous Q8H  . insulin aspart  0-6 Units Subcutaneous TID WC  . isosorbide-hydrALAZINE  1 tablet Oral TID  . metoprolol succinate  25 mg Oral Daily  . oxyCODONE  20 mg Oral Q12H  . [START ON 11/13/2019] patiromer  8.4 g Oral QODAY   Continuous Infusions:    LOS: 5 days        Hosie Poisson, MD [facility]   To contact the attending provider between 7A-7P or the covering provider during after hours 7P-7A, please log into the web site [email or web address] and access using universal [place] password for that web site. If you do not have the password, please call the hospital operator.  11/12/2019, 5:25 PM

## 2019-11-13 LAB — BASIC METABOLIC PANEL
Anion gap: 11 (ref 5–15)
BUN: 88 mg/dL — ABNORMAL HIGH (ref 6–20)
CO2: 27 mmol/L (ref 22–32)
Calcium: 8.6 mg/dL — ABNORMAL LOW (ref 8.9–10.3)
Chloride: 97 mmol/L — ABNORMAL LOW (ref 98–111)
Creatinine, Ser: 3.64 mg/dL — ABNORMAL HIGH (ref 0.44–1.00)
GFR calc Af Amer: 16 mL/min — ABNORMAL LOW (ref >60)
GFR calc non Af Amer: 14 mL/min — ABNORMAL LOW (ref >60)
Glucose, Bld: 140 mg/dL — ABNORMAL HIGH (ref 70–99)
Potassium: 3.5 mmol/L (ref 3.5–5.1)
Sodium: 135 mmol/L (ref 135–145)

## 2019-11-13 LAB — URINALYSIS, COMPLETE (UACMP) WITH MICROSCOPIC
Bilirubin Urine: NEGATIVE
Glucose, UA: NEGATIVE mg/dL
Ketones, ur: NEGATIVE mg/dL
Nitrite: NEGATIVE
Protein, ur: 30 mg/dL — AB
Specific Gravity, Urine: 1.011 (ref 1.005–1.030)
Trans Epithel, UA: 2
WBC, UA: >50 WBC/hpf — ABNORMAL HIGH (ref 0–5)
pH: 5 (ref 5.0–8.0)

## 2019-11-13 LAB — GLUCOSE, CAPILLARY
Glucose-Capillary: 120 mg/dL — ABNORMAL HIGH (ref 70–99)
Glucose-Capillary: 122 mg/dL — ABNORMAL HIGH (ref 70–99)
Glucose-Capillary: 129 mg/dL — ABNORMAL HIGH (ref 70–99)
Glucose-Capillary: 143 mg/dL — ABNORMAL HIGH (ref 70–99)
Glucose-Capillary: 164 mg/dL — ABNORMAL HIGH (ref 70–99)

## 2019-11-13 LAB — CBC
HCT: 30 % — ABNORMAL LOW (ref 36.0–46.0)
Hemoglobin: 8.9 g/dL — ABNORMAL LOW (ref 12.0–15.0)
MCH: 27 pg (ref 26.0–34.0)
MCHC: 29.7 g/dL — ABNORMAL LOW (ref 30.0–36.0)
MCV: 90.9 fL (ref 80.0–100.0)
Platelets: 154 10*3/uL (ref 150–400)
RBC: 3.3 MIL/uL — ABNORMAL LOW (ref 3.87–5.11)
RDW: 16.7 % — ABNORMAL HIGH (ref 11.5–15.5)
WBC: 5.1 10*3/uL (ref 4.0–10.5)
nRBC: 0.4 % — ABNORMAL HIGH (ref 0.0–0.2)

## 2019-11-13 LAB — PROTEIN / CREATININE RATIO, URINE
Creatinine, Urine: 152.28 mg/dL
Protein Creatinine Ratio: 0.28 mg/mg{Cre} — ABNORMAL HIGH (ref 0.00–0.15)
Total Protein, Urine: 43 mg/dL

## 2019-11-13 LAB — MAGNESIUM: Magnesium: 2.2 mg/dL (ref 1.7–2.4)

## 2019-11-13 MED ORDER — FUROSEMIDE 10 MG/ML IJ SOLN
120.0000 mg | Freq: Once | INTRAVENOUS | Status: AC
  Administered 2019-11-13: 120 mg via INTRAVENOUS
  Filled 2019-11-13: qty 12

## 2019-11-13 MED ORDER — METOLAZONE 5 MG PO TABS: 10.0000 mg | ORAL_TABLET | Freq: Once | ORAL | Status: DC

## 2019-11-13 MED ORDER — DARBEPOETIN ALFA 40 MCG/0.4ML IJ SOSY
40.0000 ug | PREFILLED_SYRINGE | INTRAMUSCULAR | Status: DC
  Administered 2019-11-13 – 2019-11-20 (×2): 40 ug via SUBCUTANEOUS
  Filled 2019-11-13 (×2): qty 0.4

## 2019-11-13 MED ORDER — FUROSEMIDE 10 MG/ML IJ SOLN
120.0000 mg | Freq: Once | INTRAVENOUS | Status: AC
  Administered 2019-11-13: 120 mg via INTRAVENOUS
  Filled 2019-11-13: qty 2

## 2019-11-13 MED ORDER — SODIUM CHLORIDE 0.9 % IV SOLN
INTRAVENOUS | Status: DC | PRN
  Administered 2019-11-13 – 2019-11-17 (×2): 250 mL via INTRAVENOUS

## 2019-11-13 MED ORDER — METOLAZONE 5 MG PO TABS
10.0000 mg | ORAL_TABLET | ORAL | Status: AC
  Administered 2019-11-13: 10 mg via ORAL
  Filled 2019-11-13: qty 2

## 2019-11-13 NOTE — Progress Notes (Signed)
[facility] Progress Note  Name: [name] MRN: [id number] DOB: 29-Oct-1965  Chief Complaint:  Pain after a fall and progressive swelling   Subjective:  She had 1 liter out over 2/27.  Though urine output is marginal she states feels better and less swollen.  She wore her cpap.  Happy kidney function has been stable but understands it's still low.  Not established with a lymphedema clinic.  We discussed risks/benefits/indicatons for ESA and she agrees to receive aranesp.   Review of systems:     shortness of breath has gotten better Denies cp  Denies n/v ------ Background on consult:  26F with extensive past medical history of morbid obesity, chronic systolic heart failure with recent exacerbation earlier this year, type 2 diabetes, very limited mobility secondary to habitus and OA/malfunction of left TKR, OSA, chronic pain on narcotics who was admitted on 2/23 presenting with intractable lumbar and cervical neck pain following a fall, increased myoclonus, and progressive peripheral edema.  Systolic heart failure due to nonischemic cardiomyopathy, she was admitted in January with decompensated heart failure, twice.  She has had progressive decline in renal function over the past 3 years, most recently with a creatinine between 1.8 and 2.0.  Presenting creatinine was 3.6, 3.76 today.  This has been complicated by hyperkalemia, currently requiring Lokelma 10 g twice daily.  Today's value is 5.9.  She is on Lasix 40 mg IV twice daily with very little urine output to date, she has a Foley catheter.  She is unable to obtain daily weights.  She does note significant progressive edema throughout the legs, and across the abdomen.  UA at presentation with 11-20 erythrocytes per high-powered field, significant pyuria, greater than 50 per high-power field, 2+ proteinuria.  Her urine sodium is less than 10.  CT of the abdomen and pelvis yesterday without evidence of hydronephrosis or  obstruction.  She denies use of NSAIDs preceding admission.  No exposure to TMP/SMX.  No IV contrast.  She appears to be 7 to 10 kg above her preceding weights, if accurate.  Most recent TTE was 09/12/2019 and had an LVEF of 35 to 40%, mild LVH; grade 1 diastolic dysfunction. the exam was very limited.  Her main concern currently is her ongoing chronic pain.  It is unclear if she has exertional symptoms.  Her mother was a dialysis patient locally, who died in 29-Dec-2017.   Intake/Output Summary (Last 24 hours) at 11/13/2019 1103 Last data filed at 11/13/2019 0925 Gross per 24 hour  Intake 655 ml  Output 1000 ml  Net -345 ml    Vitals:  Vitals:   11/12/19 29-Dec-2105 11/13/19 0455 11/13/19 0919 11/13/19 1007  BP: 118/65 114/66 110/65   Pulse: 78 73 76   Resp: 19 18 18    Temp: 97.9 F (36.6 C) 97.8 F (36.6 C)  98 F (36.7 C)  TempSrc: Oral Axillary  Oral  SpO2: 97% 100% 91%   Weight:  (!) 237.2 kg    Height:         Physical Exam:   General adult female in bed in no acute distress. Wearing CPAP. Obese body habitus  HEENT normocephalic atraumatic extraocular movements intact sclera anicteric Neck increased circumference  Lungs reduced on auscultation bilaterally normal work of breathing at rest  Heart S1S2 no rub Abdomen soft obese habitus  Extremities hard bilateral lower extremity edema and abd wall edema - a little better/softer.  Has chronic appearing lymphedema as well  Psych normal mood  and affect Neuro - alert and oriented x 3 and conversant  Skin - upper extremities with decreased skin turgor; lyphedema lower extremities  gu foley in place with urine noted  Medications reviewed    Labs:  BMP Latest Ref Rng & Units 11/13/2019 11/12/2019 11/11/2019  Glucose 70 - 99 mg/dL 140(H) 155(H) 140(H)  BUN 6 - 20 mg/dL 88(H) 87(H) 87(H)  Creatinine 0.44 - 1.00 mg/dL 3.64(H) 3.79(H) 3.88(H)  Sodium 135 - 145 mmol/L 135 135 135  Potassium 3.5 - 5.1 mmol/L 3.5 4.0 4.4  Chloride 98 - 111 mmol/L  97(L) 98 97(L)  CO2 22 - 32 mmol/L 27 27 26   Calcium 8.9 - 10.3 mg/dL 8.6(L) 8.8(L) 8.9     Assessment/Plan:   26F with AoCKD3, decompensated systolic CHF, morbid obesity with limited mobility, chronic pain, mild hyperkalemia  1. AoCKD3: No obstruction, no nephrotoxic exposures, absent urine sodium suggests cardiorenal syndrome which is consistent with clinical picture.  She has not had especially good response to lasix 120 mg IV bolus doses or lasix gtt at 10 augmented with metolazone.  Also tried metolazone 5 mg and torsemide 50 mg on 2/27.  1. She is high risk of progression to dialysis dependence which would be very difficult given comorbidities  2. Metolazone 10 mg once today before lasix 120 mg IV then nephro to reassess response before additional lasix today  3. Would continue foley   4. Update UA and up/cr ratio  2. Hyperkalemia - was switched to veltassa for management.  Discontinue scheduled dosing for now. 3. Acute on chronic decompensated systolic heart failure, nonischemic. Diuretic titration as above   Discussed wearing CPAP at night and for naps, too 4. CKD stage III - most recently with a creatinine between 1.8 and 2.0. 5. Super morbid obesity - dialysis would be very difficult in the setting of supermorbid obesity 6. Limited mobility - secondary to above 7. Chronic pain, on longstanding narcotics 8. HTN - have discontinued amlodipine   9. DM2 - per primary team  10. OSA - per -primary team  11. Anemia, normocytic; iron deficiency 09/16/19.  feraheme x 1 dose on 2/26.  Start aranesp 40 mcg every Sun for now, first 2/28 12. Chronic lymphedema - would benefit from referral to PT/lymphedema clinic as an outpatient   Claudia Desanctis, MD 11/13/2019  11:39 AM

## 2019-11-13 NOTE — Progress Notes (Signed)
PROGRESS NOTE    [name]  MBW:[id number] DOB: [birth year] DOA: 11/07/2019 PCP: Vonna Drafts, FNP  Brief Narrative:  54 year old lady with prior history of chronic systolic heart failure, diabetes mellitus, morbid obesity, chronic kidney disease was brought in for bilateral lower extremity edema and worsening back pain due to a fall from the stretcher 2 weeks ago.  On arrival to ED she was found to be in acute kidney injury and acute on chronic systolic heart failure. Nephrology consulted, she was initially scheduled for HD but was cancelled.  She was started on high-dose IV Lasix 120 mg 3 times daily and Lasix gtt. without improvement in her renal parameters. further recommendations as per Nephrology.  Pt reports she feels good after using CPAP.    Assessment & Plan:   Principal Problem:   ARF (acute renal failure) (HCC) Active Problems:   Essential hypertension   Diabetes mellitus ([place])   Acute on chronic systolic congestive heart failure (HCC)   CKD (chronic kidney disease), stage III   Hyperkalemia   Palliative care by specialist   Goals of care, counseling/discussion   Acute on chronic stage IV kidney disease with hyperkalemia,  CT of the abdomen ruled out obstruction.  Probably cardiorenal syndrome from CHF. Patient did not diurese much with high-dose IV Lasix 120 mg 3 times daily and IV Lasix gtt.   A dose of metolazone and lasix IV 120 mg given today.   Appreciate nephrology recommendations.  High risk of progression to dialysis which will be very difficult in view of multiple medical comorbidities [place] for hyperkalemia. Resolved.  Avoid nephrotoxins and monitor renal parameters and urine output. Uremia appears to have improved.    Acute respiratory failure with hypoxia requiring 2 L of nasal cannula oxygen Acute on chronic systolic and diastolic heart failure heart failure/nonischemic cardiomyopathy Last echo cardiogram on 09/12/2019 showed ejection  fraction of 35 to 40%, global hypokinesis, grade 1 diastolic dysfunction. Two doses of IV lasix and IV Metolazone ordered.  Daily weights and strict intake and output  And fluid restriction Appreciate cardiology input.   Acute  Metabolic Encephalopathy: Suspect from ESRD and uremia.  Appears to have resolved, patient is alert and oriented to place, person and time.    Morbid obesity:  Body mass index is 92.65 kg/m.   Obstructive sleep apnea On CPAP at night, appear to tolerate well.   Elevated troponins Probably secondary to demand ischemia from acute CHF.  Myoclonic jerks with some tremors possibly secondary to uremia.  Gabapentin on hold since admission No myoclonic jerks today.    Neck pain CT of the cervical spine was done on 2/23 and it shows 1. No acute osseous abnormality identified in the cervical spine. Solid C4-C7 anterior fusion. Pain control  Back pain CT of the lumbar spine on admission shows No acute osseous abnormality identified in the lumbar spine. Mid and lower lumbar disc degeneration and advanced facet arthrosis with chronic severe spinal stenosis at L3-4 and at least moderate spinal stenosis at L4-5. Severe left neural foraminal stenosis at L5-S1. Pain control and PT evaluation. PT evaluation recommending SNF.    Diabetes mellitus CBG (last 3)  Recent Labs    11/13/19 0621 11/13/19 0624 11/13/19 1155  GLUCAP 122* 120* 129*   Continue with SSI.  No change in medications,  A1c is 8.8    Abnormal UA/UTI Completed 5 days of IV rocephin.    Essential hypertension BP well controlled.  Continue with Bidil, and metoprolol.  Pressure injury present on admission Pressure Injury 11/12/17 Stage II -  Partial thickness loss of dermis presenting as a shallow open ulcer with a red, pink wound bed without slough. 2x1 cm,located in the lower L buttock (Active)  11/12/17 1045  Location: Buttocks  Location Orientation: Left  Staging: Stage II -  Partial  thickness loss of dermis presenting as a shallow open ulcer with a red, pink wound bed without slough.  Wound Description (Comments): 2x1 cm,located in the lower L buttock  Present on Admission: Yes     Wound care consulted   In view of the above, AKI, CHF, NICM, poor functional status, poor progression, morbid obesity, poor prognosis from the above medical issues will request for palliative care consult for goals of care. As of now, pt would like to continue with aggressive management and hopeful for a meaning ful recovery. She is also interested in going to SNF if discharge to home is not safe.   DVT prophylaxis: (Heparin subcu 3 times daily Code Status: Full code Family Communication: none at bedside.    disposition Plan:  . Patient is from home, therapy eval pending.  . Pt is not safe for discharge home at this time. Still actively diuresing with high dose of iv LASIX.    Consultants:   Nephrology  Cardiology   palliative care  Procedures: CT of the abdomen without contrast CT of the cervical spine and lumbar spine without contrast  Antimicrobials: Rocephin for UTI  Subjective: GOOD spirits, no chest pain or sob. No nausea or vomiting.  Objective: Vitals:   11/12/19 2107 11/13/19 0455 11/13/19 0919 11/13/19 1007  BP: 118/65 114/66 110/65   Pulse: 78 73 76   Resp: 19 18 18    Temp: 97.9 F (36.6 C) 97.8 F (36.6 C)  98 F (36.7 C)  TempSrc: Oral Axillary  Oral  SpO2: 97% 100% 91%   Weight:  (!) 237.2 kg    Height:        Intake/Output Summary (Last 24 hours) at 11/13/2019 1800 Last data filed at 11/13/2019 1722 Gross per 24 hour  Intake 836.23 ml  Output 1250 ml  Net -413.77 ml   Filed Weights   11/11/19 0609 11/12/19 0608 11/13/19 0455  Weight: (!) 236.3 kg (!) 235.9 kg (!) 237.2 kg    Examination:  General exam: Morbidly obese lady, does not appear to be in any kind of distress. Respiratory system: Diminished air entry at bases, no wheezing or  rhonchi Cardiovascular system: S1-S2 heard, regular rate rhythm, no JVD, bilateral leg edema present Gastrointestinal system: Abdomen is soft, nontender, nondistended, bowel sounds normal Central nervous system: Alert and oriented x4 and answering all questions appropriately, no myoclonic jerks appreciated today Extremities bilateral leg edema present Skin: left buttock stage 2 pressure injury.  Psychiatry: Mood is appropriate    Data Reviewed: I have personally reviewed following labs and imaging studies  CBC: Recent Labs  Lab 11/07/19 2145 11/07/19 2145 11/08/19 0459 11/08/19 0459 11/09/19 0408 11/10/19 0519 11/11/19 0523 11/12/19 0521 11/13/19 0314  WBC 5.8   < > 5.4   < > 5.4 5.4 5.4 6.5 5.1  NEUTROABS 4.6  --  4.1  --   --   --   --   --   --   HGB 10.3*   < > 10.0*   < > 9.7* 9.5* 9.3* 9.0* 8.9*  HCT 35.6*   < > 34.4*   < > 33.4* 31.5* 30.4* 29.9* 30.0*  MCV  95.4   < > 93.5   < > 93.8 92.4 89.9 89.8 90.9  PLT 214   < > 196   < > 206 180 179 178 154   < > = values in this interval not displayed.   Basic Metabolic Panel: Recent Labs  Lab 11/09/19 0408 11/09/19 0408 11/10/19 1610 11/10/19 0519 11/10/19 1457 11/10/19 1942 11/11/19 0523 11/12/19 0521 11/13/19 0314  NA 134*   < > 133*   < > 135 135 135 135 135  K 5.9*   < > 5.7*   < > 6.6* 5.3* 4.4 4.0 3.5  CL 98   < > 97*   < > 98 97* 97* 98 97*  CO2 24   < > 23   < > 21* 25 26 27 27   GLUCOSE 183*   < > 188*   < > 184* 178* 140* 155* 140*  BUN 80*   < > 85*   < > 91* 87* 87* 87* 88*  CREATININE 3.76*   < > 3.83*   < > 4.07* 3.98* 3.88* 3.79* 3.64*  CALCIUM 8.9   < > 8.9   < > 8.9 9.0 8.9 8.8* 8.6*  MG 2.3  --  2.5*  --   --   --  2.3 2.3 2.2  PHOS  --   --   --   --  5.8*  --   --  5.2*  --    < > = values in this interval not displayed.   GFR: Estimated Creatinine Clearance: 36 mL/min (A) (by C-G formula based on SCr of 3.64 mg/dL (H)). Liver Function Tests: Recent Labs  Lab 11/07/19 2145 11/08/19 0459  11/10/19 1457  AST 23 21  --   ALT 15 16  --   ALKPHOS 82 83  --   BILITOT 1.4* 1.3*  --   PROT 9.3* 9.0*  --   ALBUMIN 3.6 3.6 3.4*   No results for input(s): LIPASE, AMYLASE in the last 168 hours. No results for input(s): AMMONIA in the last 168 hours. Coagulation Profile: No results for input(s): INR, PROTIME in the last 168 hours. Cardiac Enzymes: No results for input(s): CKTOTAL, CKMB, CKMBINDEX, TROPONINI in the last 168 hours. BNP (last 3 results) No results for input(s): PROBNP in the last 8760 hours. HbA1C: No results for input(s): HGBA1C in the last 72 hours. CBG: Recent Labs  Lab 11/12/19 1704 11/12/19 2126 11/13/19 0621 11/13/19 0624 11/13/19 1155  GLUCAP 171* 158* 122* 120* 129*   Lipid Profile: No results for input(s): CHOL, HDL, LDLCALC, TRIG, CHOLHDL, LDLDIRECT in the last 72 hours. Thyroid Function Tests: No results for input(s): TSH, T4TOTAL, FREET4, T3FREE, THYROIDAB in the last 72 hours. Anemia Panel: No results for input(s): VITAMINB12, FOLATE, FERRITIN, TIBC, IRON, RETICCTPCT in the last 72 hours. Sepsis Labs: No results for input(s): PROCALCITON, LATICACIDVEN in the last 168 hours.  Recent Results (from the past 240 hour(s))  SARS CORONAVIRUS 2 (TAT 6-24 HRS) Nasopharyngeal Nasopharyngeal Swab     Status: None   Collection Time: 11/07/19 11:10 PM   Specimen: Nasopharyngeal Swab  Result Value Ref Range Status   SARS Coronavirus 2 NEGATIVE NEGATIVE Final    Comment: (NOTE) SARS-CoV-2 target nucleic acids are NOT DETECTED. The SARS-CoV-2 RNA is generally detectable in upper and lower respiratory specimens during the acute phase of infection. Negative results do not preclude SARS-CoV-2 infection, do not rule out co-infections with other pathogens, and should not be used as the  sole basis for treatment or other patient management decisions. Negative results must be combined with clinical observations, patient history, and epidemiological  information. The expected result is Negative. Fact Sheet for Patients: SugarRoll.be Fact Sheet for Healthcare Providers: https://www.woods-mathews.com/ This test is not yet approved or cleared by the [place] FDA and  has been authorized for detection and/or diagnosis of SARS-CoV-2 by FDA under an Emergency Use Authorization (EUA). This EUA will remain  in effect (meaning this test can be used) for the duration of the COVID-19 declaration under Section 56 4(b)(1) of the Act, 21 U.S.C. section 360bbb-3(b)(1), unless the authorization is terminated or revoked sooner. Performed at [facility], [place] [address], [place], [place] [address]   Culture, Urine     Status: Abnormal   Collection Time: 11/08/19  7:36 AM   Specimen: Urine, Random  Result Value Ref Range Status   Specimen Description URINE, RANDOM  Final   Special Requests   Final    NONE Performed at [facility], [place] [address], [place], [place] [address]    Culture MULTIPLE SPECIES PRESENT, SUGGEST RECOLLECTION (A)  Final   Report Status 11/09/2019 FINAL  Final         Radiology Studies: No results found.      Scheduled Meds: . allopurinol  300 mg Oral Daily  . buprenorphine  1 patch Transdermal Q Sat  . Chlorhexidine Gluconate Cloth  6 each Topical Daily  . darbepoetin (ARANESP) injection - NON-DIALYSIS  40 mcg Subcutaneous Q Sun-1800  . heparin  5,000 Units Subcutaneous Q8H  . insulin aspart  0-6 Units Subcutaneous TID WC  . isosorbide-hydrALAZINE  1 tablet Oral TID  . metoprolol succinate  25 mg Oral Daily  . oxyCODONE  20 mg Oral Q12H   Continuous Infusions: . sodium chloride 250 mL (11/13/19 1321)     LOS: 6 days        Hosie Poisson, MD [facility]   To contact the attending provider between 7A-7P or the covering provider during after hours 7P-7A, please log into the web site [email or web address] and access using universal [place]  password for that web site. If you do not have the password, please call the hospital operator.  11/13/2019, 6:00 PM

## 2019-11-13 NOTE — Progress Notes (Signed)
Palliative Medicine Inpatient Follow Up Note   HPI: [name] Hallis a 54 y.o.femalewithhistory of chronic systolic heart failure, diabetes mellitus, morbid obesity, chronic kidney disease who was admitted January last month for CHF exacerbation subsequent which patient came back to the ER the following week for accidental overdose of oxycodone presents to the ER after patient has been having increasing neck pain and low back pain after having a fall about 2 weeks ago. Denies any weakness of the extremities or any incontinence of urine or bowel. Patient has noted increasing tremors of the extremities. Patient also noted increasing bilateral lower extremity edema with no definite shortness of breath or chest pain.  2/26 - Palliative care was asked to be involved to help with goals of care conversations in the setting of reduced renal function. Patient thought to be a poor OP HD candidate given her morbid obesity.   Today's Discussion (11/13/2019): Chart reviewed. Patient is doing better today. She is relieved that her kidneys are responding to the present treatments. She remains to be hopeful in terms of continued improvements.   I asked Rajni if she had given more consideration to our discussions on code status which she said that she had but was not ready to make a decision. I asked her if a Palliative provider could follow up with her once she goes home and she also did not feel ready to make that decision.  Iris said that she would like to focus on the positive today. I shared that this is wonderful news that her kidneys are improving but its imperative that she also talk to her family about the future. She agreed and said that she'd speak with her daughters. I provided her a MOST form to review with them.   Discussed with patient the importance of continued conversation with family and their  medical providers regarding overall plan of care and treatment options, ensuring  decisions are within the context of the patients values and GOCs.  Questions and concerns addressed   Vital Signs Vitals:   11/13/19 0919 11/13/19 1007  BP: 110/65   Pulse: 76   Resp: 18   Temp:  98 F (36.7 C)  SpO2: 91%     Intake/Output Summary (Last 24 hours) at 11/13/2019 1143 Last data filed at 11/13/2019 0925 Gross per 24 hour  Intake 655 ml  Output 1000 ml  Net -345 ml   Last Weight  Most recent update: 11/13/2019  6:48 AM   Weight  237.2 kg (523 lb)             Physical Exam Vitals and nursing note reviewed.  HENT:     Head: Normocephalic.     Nose: Nose normal.     Mouth/Throat:     Mouth: Mucous membranes are dry.  Eyes:     Pupils: Pupils are equal, round, and reactive to light.  Cardiovascular:     Rate and Rhythm: Normal rate and regular rhythm.     Pulses: Normal pulses.  Pulmonary:     Comments: 3LPM [place] Abdominal:     Palpations: Abdomen is soft.  Musculoskeletal:     Cervical back: Normal range of motion.  Skin:    General: Skin is dry.     Capillary Refill: Capillary refill takes less than 2 seconds.  Neurological:     Mental Status: She is alert and oriented to person, place, and time.  Psychiatric:        Mood and Affect: Mood normal.  SUMMARY OF RECOMMENDATIONS   Chaplain --> Help patient in filling out advanced directive  Continue to have ongoing [place] discussions regarding chronic conditions, MOST provided for review  Introduced the idea of a Palliative provider following up when patient goes home which she would like to discuss with her family  Time Spent: 15 Greater than 50% of the time was spent in counseling and coordination of care ______________________________________________________________________________________ [place] Team Team Cell Phone: [phone] Please utilize secure chat with additional questions, if there is no response within 30 minutes please call the above phone  number  Palliative Medicine Team providers are available by phone from 7am to 7pm daily and can be reached through the team cell phone.  Should this patient require assistance outside of these hours, please call the patient's attending physician.

## 2019-11-14 LAB — BASIC METABOLIC PANEL
Anion gap: 13 (ref 5–15)
BUN: 90 mg/dL — ABNORMAL HIGH (ref 6–20)
CO2: 26 mmol/L (ref 22–32)
Calcium: 8.8 mg/dL — ABNORMAL LOW (ref 8.9–10.3)
Chloride: 95 mmol/L — ABNORMAL LOW (ref 98–111)
Creatinine, Ser: 3.63 mg/dL — ABNORMAL HIGH (ref 0.44–1.00)
GFR calc Af Amer: 16 mL/min — ABNORMAL LOW (ref >60)
GFR calc non Af Amer: 14 mL/min — ABNORMAL LOW (ref >60)
Glucose, Bld: 163 mg/dL — ABNORMAL HIGH (ref 70–99)
Potassium: 3.6 mmol/L (ref 3.5–5.1)
Sodium: 134 mmol/L — ABNORMAL LOW (ref 135–145)

## 2019-11-14 LAB — GLUCOSE, CAPILLARY
Glucose-Capillary: 136 mg/dL — ABNORMAL HIGH (ref 70–99)
Glucose-Capillary: 138 mg/dL — ABNORMAL HIGH (ref 70–99)
Glucose-Capillary: 158 mg/dL — ABNORMAL HIGH (ref 70–99)
Glucose-Capillary: 141 mg/dL — ABNORMAL HIGH (ref 70–99)

## 2019-11-14 MED ORDER — FUROSEMIDE 10 MG/ML IJ SOLN
80.0000 mg | Freq: Two times a day (BID) | INTRAMUSCULAR | Status: DC
  Administered 2019-11-14 – 2019-11-16 (×4): 80 mg via INTRAVENOUS
  Filled 2019-11-14 (×4): qty 8

## 2019-11-14 MED ORDER — ISOSORB DINITRATE-HYDRALAZINE 20-37.5 MG PO TABS
1.0000 | ORAL_TABLET | Freq: Two times a day (BID) | ORAL | Status: DC
  Administered 2019-11-14 – 2019-11-21 (×13): 1 via ORAL
  Filled 2019-11-14 (×14): qty 1

## 2019-11-14 MED ORDER — SODIUM CHLORIDE 0.9 % IV SOLN
510.0000 mg | Freq: Once | INTRAVENOUS | Status: AC
  Administered 2019-11-15: 510 mg via INTRAVENOUS
  Filled 2019-11-14 (×2): qty 17

## 2019-11-14 NOTE — Progress Notes (Signed)
I responded to spiritual care for and AD request. Pt was resting and alert. No family present at this time. I offered Pt space to voice concerns. She tearfully voiced her fear for her health. She said she wants to live and do better. She said that she recently was informed of dialysis and thought of how her life would change. She said she wants to live to see her Grandchild., especially to see her grandchild that is due this coming August. I offered her spiritual care with empathic listening, words of comfort and ministry of presence.  I offered her an overview of the AD. She said she will discuss it with her daughter when she arrives. She said her daughter will help to fill the AD out along with the MOST form. I will follow up later in the week.   Palliative Care Chaplain Resident Fidel Levy [phone]

## 2019-11-14 NOTE — Progress Notes (Signed)
Subjective:  950 of UOP recorded - BUN and crt stable but poor-  Feels OK - is not uremic Objective Vital signs in last 24 hours: Vitals:   11/13/19 1945 11/14/19 0522 11/14/19 0855 11/14/19 1158  BP: 105/63 95/62 123/76 121/70  Pulse: 78 72 80 73  Resp: 20 18 19 18   Temp: 98 F (36.7 C) (!) 97.5 F (36.4 C) 97.9 F (36.6 C) 97.6 F (36.4 C)  TempSrc: Oral Oral Oral Oral  SpO2: 94% 91% 91% 98%  Weight:  (!) 238.6 kg    Height:       Weight change: 1.361 kg  Intake/Output Summary (Last 24 hours) at 11/14/2019 1326 Last data filed at 11/14/2019 1215 Gross per 24 hour  Intake 661.23 ml  Output 625 ml  Net 36.23 ml    Assessment/ Plan: Pt is a 54 y.o. yo female with morbid obesity, DM, systolic heart failure who was admitted on 11/07/2019 with pain after a fall, A on CRF with volume overload Assessment/Plan: 1. Renal-  Progressive decline in renal function over the past 3 years, most recently with a creatinine between 1.8 and 2.0. Presenting creatinine was 3.6- peaked at 4-  Is 3.6 today.  Non oliguric. No hydro on imaging-  U/A with proteinuria, pyuria and microscopic hematuria- additional work up limited by body size.  Mother was a dialysis patient who died in 12/20/2017.  There are no absolute indications for dialysis-  Really unclear if would be possible given 240 kg and basically bed ridden status but tells me she was ambulatory 6 mos ago, able to stand/pivot to her power chair just recently-  Feels that if her volume status was better she could ambulate again 2. HTN/vol-  Very difficult to determine volume status but does seem overloaded.  BP reading well/low but unable to determine accuracy - is on toprol and bidil-  Received 120 of IV yesterday plus metolazone.  I think needs scheduled lasix, will start 80 IV q 12 hours-  Will also decrease her bidil so that BP does not get too low and give ATN.  Continue with CPAP 3. Anemia- giving iron  and ESA- hgb around 9-  Will give second dose of  feraheme tomorrow 4. Hyperkalemia-  Was an issue earlier in hosp- now WNL-  No meds 5. Dispo-  Very difficult situation-  Palliative care is involved.  Not willing to commit to code status.  As stated previously dialysis would be logistically very difficult but after talking to her I was more encouraged, she has a good attitude and wants to live   [place]: Basic Metabolic Panel: Recent Labs  Lab 11/10/19 1457 11/10/19 1942 11/12/19 0521 11/13/19 0314 11/14/19 0905  NA 135   < > 135 135 134*  K 6.6*   < > 4.0 3.5 3.6  CL 98   < > 98 97* 95*  CO2 21*   < > 27 27 26   GLUCOSE 184*   < > 155* 140* 163*  BUN 91*   < > 87* 88* 90*  CREATININE 4.07*   < > 3.79* 3.64* 3.63*  CALCIUM 8.9   < > 8.8* 8.6* 8.8*  PHOS 5.8*  --  5.2*  --   --    < > = values in this interval not displayed.   Liver Function Tests: Recent Labs  Lab 11/07/19 December 21, 2143 11/08/19 0459 11/10/19 1457  AST 23 21  --   ALT 15 16  --  ALKPHOS 82 83  --   BILITOT 1.4* 1.3*  --   PROT 9.3* 9.0*  --   ALBUMIN 3.6 3.6 3.4*   No results for input(s): LIPASE, AMYLASE in the last 168 hours. No results for input(s): AMMONIA in the last 168 hours. CBC: Recent Labs  Lab 11/07/19 2145 11/07/19 2145 11/08/19 0459 11/08/19 0459 11/09/19 0408 11/09/19 0408 11/10/19 0519 11/10/19 0519 11/11/19 0523 11/12/19 0521 11/13/19 0314  WBC 5.8   < > 5.4   < > 5.4   < > 5.4   < > 5.4 6.5 5.1  NEUTROABS 4.6  --  4.1  --   --   --   --   --   --   --   --   HGB 10.3*   < > 10.0*   < > 9.7*   < > 9.5*   < > 9.3* 9.0* 8.9*  HCT 35.6*   < > 34.4*   < > 33.4*   < > 31.5*   < > 30.4* 29.9* 30.0*  MCV 95.4   < > 93.5   < > 93.8  --  92.4  --  89.9 89.8 90.9  PLT 214   < > 196   < > 206   < > 180   < > 179 178 154   < > = values in this interval not displayed.   Cardiac Enzymes: No results for input(s): CKTOTAL, CKMB, CKMBINDEX, TROPONINI in the last 168 hours. CBG: Recent Labs  Lab 11/13/19 1155  11/13/19 1732 11/13/19 2113 11/14/19 0621 11/14/19 1121  GLUCAP 129* 143* 164* 136* 138*    Iron Studies: No results for input(s): IRON, TIBC, TRANSFERRIN, FERRITIN in the last 72 hours. Studies/Results: No results found. Medications: Infusions: . sodium chloride 250 mL (11/13/19 1321)    Scheduled Medications: . allopurinol  300 mg Oral Daily  . buprenorphine  1 patch Transdermal Q Sat  . Chlorhexidine Gluconate Cloth  6 each Topical Daily  . darbepoetin (ARANESP) injection - NON-DIALYSIS  40 mcg Subcutaneous Q Sun-1800  . heparin  5,000 Units Subcutaneous Q8H  . insulin aspart  0-6 Units Subcutaneous TID WC  . isosorbide-hydrALAZINE  1 tablet Oral TID  . metoprolol succinate  25 mg Oral Daily  . oxyCODONE  20 mg Oral Q12H    have reviewed scheduled and prn medications.  Physical Exam: General: alert, clean lunch tray Heart: RRR Lungs: mostly clear Abdomen: obese, abdominal wall edema Extremities: pitting edema    11/14/2019,1:26 PM  LOS: 7 days

## 2019-11-14 NOTE — Progress Notes (Signed)
PROGRESS NOTE    [name]  GDJ:[id number] DOB: 05/12/1966 DOA: 11/07/2019 PCP: Vonna Drafts, FNP  Brief Narrative:  54 year old lady with prior history of chronic systolic heart failure, diabetes mellitus, morbid obesity, chronic kidney disease was brought in for bilateral lower extremity edema and worsening back pain due to a fall from the stretcher 2 weeks ago.  On arrival to ED she was found to be in acute kidney injury and acute on chronic systolic heart failure. Nephrology consulted, she was initially scheduled for HD but was cancelled.  She was started on high-dose IV Lasix 120 mg 3 times daily and Lasix gtt. without improvement in her renal parameters. further recommendations as per Nephrology.     Assessment & Plan:   Principal Problem:   ARF (acute renal failure) (HCC) Active Problems:   Essential hypertension   Diabetes mellitus ([place])   Acute on chronic systolic congestive heart failure (HCC)   CKD (chronic kidney disease), stage III   Hyperkalemia   Palliative care by specialist   Goals of care, counseling/discussion   Acute on chronic stage IV kidney disease with hyperkalemia,  CT of the abdomen ruled out obstruction.  Probably cardiorenal syndrome from CHF. Pt had about 950 ml urine overnight.    Appreciate nephrology recommendations.  High risk of progression to dialysis which will be very difficult in view of multiple medical co-morbidities. Currently no emergent indication of HD, her K appears normal. Plan for high dose lasix IV 80 mg bid.  Veltassa for hyperkalemia. Resolved.  Avoid nephrotoxins and monitor renal parameters and urine output. Uremia appears to have improved. She is alert and appears to be oriented.    Acute respiratory failure with hypoxia requiring between 2 to 3  L of nasal cannula oxygen Acute on chronic systolic and diastolic heart failure heart failure/nonischemic cardiomyopathy Last echo cardiogram on 09/12/2019 showed ejection  fraction of 35 to 40%, global hypokinesis, grade 1 diastolic dysfunction. Being diuresed with IV Lasix 80 mg twice daily Continue with daily weights, strict intake and output and fluid restriction to 1200 mL/day Appreciate cardiology input.   Acute  Metabolic Encephalopathy: Suspect from ESRD and uremia.  Resolved, patient is alert, oriented x4   Morbid obesity:  Body mass index is 93.18 kg/m.   Obstructive sleep apnea On CPAP at night, appear to tolerate well.  Ordered CPAP for home  Elevated troponins Probably secondary to demand ischemia from acute CHF.  Patient currently denies any chest pain or shortness of breath  Myoclonic jerks with some tremors possibly secondary to uremia.  Gabapentin on hold since admission No myoclonic jerks today.    Neck pain CT of the cervical spine was done on 2/23 and it shows 1. No acute osseous abnormality identified in the cervical spine. Solid C4-C7 anterior fusion. Pain control  Back pain CT of the lumbar spine on admission shows No acute osseous abnormality identified in the lumbar spine. Mid and lower lumbar disc degeneration and advanced facet arthrosis with chronic severe spinal stenosis at L3-4 and at least moderate spinal stenosis at L4-5. Severe left neural foraminal stenosis at L5-S1. Pain control and PT evaluation. PT evaluation recommending SNF.    Diabetes mellitus CBG (last 3)  Recent Labs    11/14/19 0621 11/14/19 1121 11/14/19 1643  GLUCAP 136* 138* 158*  Continue with sliding scale insulin at this time.,  A1c is 8.8    Abnormal UA/UTI Completed 5 days of IV rocephin.    Essential hypertension Well-controlled blood  pressure parameters   Iron deficiency anemia Probably secondary to anemia of chronic disease from stage IV CKD IV iron given this admission.  Pressure injury present on admission Pressure Injury 11/12/17 Stage II -  Partial thickness loss of dermis presenting as a shallow open ulcer with a  red, pink wound bed without slough. 2x1 cm,located in the lower L buttock (Active)  11/12/17 1045  Location: Buttocks  Location Orientation: Left  Staging: Stage II -  Partial thickness loss of dermis presenting as a shallow open ulcer with a red, pink wound bed without slough.  Wound Description (Comments): 2x1 cm,located in the lower L buttock  Present on Admission: Yes     Wound care consulted   In view of the above, AKI, CHF, NICM, poor functional status, poor progression, morbid obesity, poor prognosis from the above medical issues will request for palliative care consult for goals of care. As of now, pt would like to continue with aggressive management and hopeful for a meaning ful recovery. She is also interested in going to SNF if discharge to home is not safe.   DVT prophylaxis: Subcutaneous heparin Code Status: Full code Family Communication: none at bedside.    disposition Plan:  . Patient is from home, therapy eval pending.  . Pt is not safe for discharge home at this time. Still actively diuresing with high dose of iv LASIX.    Consultants:   Nephrology  Cardiology   palliative care  Procedures: CT of the abdomen without contrast CT of the cervical spine and lumbar spine without contrast  Antimicrobials: Rocephin for UTI  Subjective: Patient appears to be in good spirits, no chest pain or shortness of breath, no nausea vomiting or abdominal pain Objective: Vitals:   11/13/19 1945 11/14/19 0522 11/14/19 0855 11/14/19 1158  BP: 105/63 95/62 123/76 121/70  Pulse: 78 72 80 73  Resp: 20 18 19 18   Temp: 98 F (36.7 C) (!) 97.5 F (36.4 C) 97.9 F (36.6 C) 97.6 F (36.4 C)  TempSrc: Oral Oral Oral Oral  SpO2: 94% 91% 91% 98%  Weight:  (!) 238.6 kg    Height:        Intake/Output Summary (Last 24 hours) at 11/14/2019 1732 Last data filed at 11/14/2019 1711 Gross per 24 hour  Intake 460 ml  Output 575 ml  Net -115 ml   Filed Weights   11/12/19 0608  11/13/19 0455 11/14/19 0522  Weight: (!) 235.9 kg (!) 237.2 kg (!) 238.6 kg    Examination:  General exam: Morbidly obese lady on 3 L of nasal cannula oxygen, does not appear to be in any distress. Respiratory system: Diminished air entry at bases due to body habitus, no wheezing or rhonchi Cardiovascular system: S1-S2 heard, regular rate rhythm, no JVD, bilateral leg edema present Gastrointestinal system: Abdomen is soft, nontender, nondistended bowel sounds normal Central nervous system: Alert and oriented x4, answering all questions appropriately no myoclonic jerks appreciated today Extremities bilateral leg edema present Skin: left buttock stage 2 pressure injury.  Psychiatry: Mood is appropriate    Data Reviewed: I have personally reviewed following labs and imaging studies  CBC: Recent Labs  Lab 11/07/19 2145 11/07/19 2145 11/08/19 0459 11/08/19 0459 11/09/19 0408 11/10/19 0519 11/11/19 0523 11/12/19 0521 11/13/19 0314  WBC 5.8   < > 5.4   < > 5.4 5.4 5.4 6.5 5.1  NEUTROABS 4.6  --  4.1  --   --   --   --   --   --  HGB 10.3*   < > 10.0*   < > 9.7* 9.5* 9.3* 9.0* 8.9*  HCT 35.6*   < > 34.4*   < > 33.4* 31.5* 30.4* 29.9* 30.0*  MCV 95.4   < > 93.5   < > 93.8 92.4 89.9 89.8 90.9  PLT 214   < > 196   < > 206 180 179 178 154   < > = values in this interval not displayed.   Basic Metabolic Panel: Recent Labs  Lab 11/09/19 0408 11/09/19 0408 11/10/19 0519 11/10/19 0519 11/10/19 1457 11/10/19 1457 11/10/19 1942 11/11/19 0523 11/12/19 0521 11/13/19 0314 11/14/19 0905  NA 134*   < > 133*   < > 135   < > 135 135 135 135 134*  K 5.9*   < > 5.7*   < > 6.6*   < > 5.3* 4.4 4.0 3.5 3.6  CL 98   < > 97*   < > 98   < > 97* 97* 98 97* 95*  CO2 24   < > 23   < > 21*   < > 25 26 27 27 26   GLUCOSE 183*   < > 188*   < > 184*   < > 178* 140* 155* 140* 163*  BUN 80*   < > 85*   < > 91*   < > 87* 87* 87* 88* 90*  CREATININE 3.76*   < > 3.83*   < > 4.07*   < > 3.98* 3.88*  3.79* 3.64* 3.63*  CALCIUM 8.9   < > 8.9   < > 8.9   < > 9.0 8.9 8.8* 8.6* 8.8*  MG 2.3  --  2.5*  --   --   --   --  2.3 2.3 2.2  --   PHOS  --   --   --   --  5.8*  --   --   --  5.2*  --   --    < > = values in this interval not displayed.   GFR: Estimated Creatinine Clearance: 36.3 mL/min (A) (by C-G formula based on SCr of 3.63 mg/dL (H)). Liver Function Tests: Recent Labs  Lab 11/07/19 2145 11/08/19 0459 11/10/19 1457  AST 23 21  --   ALT 15 16  --   ALKPHOS 82 83  --   BILITOT 1.4* 1.3*  --   PROT 9.3* 9.0*  --   ALBUMIN 3.6 3.6 3.4*   No results for input(s): LIPASE, AMYLASE in the last 168 hours. No results for input(s): AMMONIA in the last 168 hours. Coagulation Profile: No results for input(s): INR, PROTIME in the last 168 hours. Cardiac Enzymes: No results for input(s): CKTOTAL, CKMB, CKMBINDEX, TROPONINI in the last 168 hours. BNP (last 3 results) No results for input(s): PROBNP in the last 8760 hours. HbA1C: No results for input(s): HGBA1C in the last 72 hours. CBG: Recent Labs  Lab 11/13/19 1732 11/13/19 2113 11/14/19 0621 11/14/19 1121 11/14/19 1643  GLUCAP 143* 164* 136* 138* 158*   Lipid Profile: No results for input(s): CHOL, HDL, LDLCALC, TRIG, CHOLHDL, LDLDIRECT in the last 72 hours. Thyroid Function Tests: No results for input(s): TSH, T4TOTAL, FREET4, T3FREE, THYROIDAB in the last 72 hours. Anemia Panel: No results for input(s): VITAMINB12, FOLATE, FERRITIN, TIBC, IRON, RETICCTPCT in the last 72 hours. Sepsis Labs: No results for input(s): PROCALCITON, LATICACIDVEN in the last 168 hours.  Recent Results (from the past 240 hour(s))  SARS CORONAVIRUS  2 (TAT 6-24 HRS) Nasopharyngeal Nasopharyngeal Swab     Status: None   Collection Time: 11/07/19 11:10 PM   Specimen: Nasopharyngeal Swab  Result Value Ref Range Status   SARS Coronavirus 2 NEGATIVE NEGATIVE Final    Comment: (NOTE) SARS-CoV-2 target nucleic acids are NOT DETECTED. The  SARS-CoV-2 RNA is generally detectable in upper and lower respiratory specimens during the acute phase of infection. Negative results do not preclude SARS-CoV-2 infection, do not rule out co-infections with other pathogens, and should not be used as the sole basis for treatment or other patient management decisions. Negative results must be combined with clinical observations, patient history, and epidemiological information. The expected result is Negative. Fact Sheet for Patients: SugarRoll.be Fact Sheet for Healthcare Providers: https://www.woods-mathews.com/ This test is not yet approved or cleared by the [place] FDA and  has been authorized for detection and/or diagnosis of SARS-CoV-2 by FDA under an Emergency Use Authorization (EUA). This EUA will remain  in effect (meaning this test can be used) for the duration of the COVID-19 declaration under Section 56 4(b)(1) of the Act, 21 U.S.C. section 360bbb-3(b)(1), unless the authorization is terminated or revoked sooner. Performed at [facility], [place] [address], [place], [place] [address]   Culture, Urine     Status: Abnormal   Collection Time: 11/08/19  7:36 AM   Specimen: Urine, Random  Result Value Ref Range Status   Specimen Description URINE, RANDOM  Final   Special Requests   Final    NONE Performed at [facility], [place] [address] [place], [place] [address]    Culture MULTIPLE SPECIES PRESENT, SUGGEST RECOLLECTION (A)  Final   Report Status 11/09/2019 FINAL  Final         Radiology Studies: No results found.      Scheduled Meds: . allopurinol  300 mg Oral Daily  . buprenorphine  1 patch Transdermal Q Sat  . Chlorhexidine Gluconate Cloth  6 each Topical Daily  . darbepoetin (ARANESP) injection - NON-DIALYSIS  40 mcg Subcutaneous Q Sun-1800  . furosemide  80 mg Intravenous Q12H  . heparin  5,000 Units Subcutaneous Q8H  . insulin aspart  0-6 Units  Subcutaneous TID WC  . isosorbide-hydrALAZINE  1 tablet Oral BID  . metoprolol succinate  25 mg Oral Daily  . oxyCODONE  20 mg Oral Q12H   Continuous Infusions: . sodium chloride 250 mL (11/13/19 1321)  . [START ON 11/15/2019] ferumoxytol       LOS: 7 days        Hosie Poisson, MD [facility]   To contact the attending provider between 7A-7P or the covering provider during after hours 7P-7A, please log into the web site [email or web address] and access using universal [place] password for that web site. If you do not have the password, please call the hospital operator.  11/14/2019, 5:32 PM

## 2019-11-15 LAB — GLUCOSE, CAPILLARY
Glucose-Capillary: 130 mg/dL — ABNORMAL HIGH (ref 70–99)
Glucose-Capillary: 164 mg/dL — ABNORMAL HIGH (ref 70–99)
Glucose-Capillary: 161 mg/dL — ABNORMAL HIGH (ref 70–99)
Glucose-Capillary: 145 mg/dL — ABNORMAL HIGH (ref 70–99)

## 2019-11-15 LAB — BASIC METABOLIC PANEL
Anion gap: 11 (ref 5–15)
BUN: 94 mg/dL — ABNORMAL HIGH (ref 6–20)
CO2: 27 mmol/L (ref 22–32)
Calcium: 8.4 mg/dL — ABNORMAL LOW (ref 8.9–10.3)
Chloride: 95 mmol/L — ABNORMAL LOW (ref 98–111)
Creatinine, Ser: 3.99 mg/dL — ABNORMAL HIGH (ref 0.44–1.00)
GFR calc Af Amer: 14 mL/min — ABNORMAL LOW (ref >60)
GFR calc non Af Amer: 12 mL/min — ABNORMAL LOW (ref >60)
Glucose, Bld: 128 mg/dL — ABNORMAL HIGH (ref 70–99)
Potassium: 3.5 mmol/L (ref 3.5–5.1)
Sodium: 133 mmol/L — ABNORMAL LOW (ref 135–145)

## 2019-11-15 LAB — CBC WITH DIFFERENTIAL/PLATELET
Abs Immature Granulocytes: 0.06 10*3/uL (ref 0.00–0.07)
Basophils Absolute: 0 10*3/uL (ref 0.0–0.1)
Basophils Relative: 1 %
Eosinophils Absolute: 0.1 10*3/uL (ref 0.0–0.5)
Eosinophils Relative: 3 %
HCT: 31.8 % — ABNORMAL LOW (ref 36.0–46.0)
Hemoglobin: 9.6 g/dL — ABNORMAL LOW (ref 12.0–15.0)
Immature Granulocytes: 1 %
Lymphocytes Relative: 15 %
Lymphs Abs: 0.8 10*3/uL (ref 0.7–4.0)
MCH: 27.4 pg (ref 26.0–34.0)
MCHC: 30.2 g/dL (ref 30.0–36.0)
MCV: 90.6 fL (ref 80.0–100.0)
Monocytes Absolute: 0.8 10*3/uL (ref 0.1–1.0)
Monocytes Relative: 15 %
Neutro Abs: 3.3 10*3/uL (ref 1.7–7.7)
Neutrophils Relative %: 65 %
Platelets: 166 10*3/uL (ref 150–400)
RBC: 3.51 MIL/uL — ABNORMAL LOW (ref 3.87–5.11)
RDW: 17.1 % — ABNORMAL HIGH (ref 11.5–15.5)
WBC: 5 10*3/uL (ref 4.0–10.5)
nRBC: 0.4 % — ABNORMAL HIGH (ref 0.0–0.2)

## 2019-11-15 NOTE — TOC Initial Note (Signed)
Transition of Care [facility]) - Initial/Assessment Note    Patient Details  Name: [name] MRN: [id number] Date of Birth: 1966-08-26  [facility]) CM/SW Contact:    Alberteen Sam, LCSW Phone Number: 11/15/2019, 4:46 PM  Clinical Narrative:                  CSW spoke with patient's daughter Seth Bake to assess home support and prior level of functioning, as SNF is not feasible at this time due to patient's weight (300 max limit at most SNFs). According to Seth Bake, appears patient is currently at baseline prior to hospital admission as far as mobility needs. Patient has required 2 person assist at home and there are three people that live in the home with patient. She reports patient has a night aide for 5 hours through [facility], however is requesting additional help. Agreeable to home health services.   CSW spoke with Ennis Forts with [facility] who reports patient is currently active with them for PT and RN, CSW Requested to add OT services as well.   CSW made referral for rollator to [facility] with [employer].   Will need orders for DME rollator, and home health orders for PT, OT, RN. Will need PTAR home at dc.   Expected Discharge Plan: [place] Barriers to Discharge: Continued Medical Work up   Patient Goals and CMS Choice   CMS Medicare.gov Compare Post Acute Care list provided to:: Patient Represenative (must comment)(daughter Sherol Dade) Choice offered to / list presented to : Adult Children(daughter Sherol Dade)  Expected Discharge Plan and Services Expected Discharge Plan: [place] Choice: [place] arrangements for the past 2 months: Single Family Home                 DME Arranged: [place] rolling with seat DME Agency: [employer] Date DME Agency Contacted: 11/15/19 Time DME Agency Contacted: [phone] Representative spoke with at DME Agency: [place]: PT, OT, RN [place] Agency: [facility] [employer] Date [place]: 11/15/19 Time [place]: [place] Representative spoke with at [place]: [place] Arrangements/Services Living arrangements for the past 2 months: [place]   Patient language and need for interpreter reviewed:: Yes Do you feel safe going back to the place where you live?: Yes      Need for Family Participation in Patient Care: Yes (Comment) Care giver support system in place?: Yes (comment)   Criminal Activity/Legal Involvement Pertinent to Current Situation/Hospitalization: No - Comment as needed  Activities of Daily Living Home Assistive Devices/Equipment: Oxygen ADL Screening (condition at time of admission) Patient's cognitive ability adequate to safely complete daily activities?: Yes Is the patient deaf or have difficulty hearing?: No Does the patient have difficulty seeing, even when wearing glasses/contacts?: No Does the patient have difficulty concentrating, remembering, or making decisions?: No Patient able to express need for assistance with ADLs?: Yes Does the patient have difficulty dressing or bathing?: Yes Independently performs ADLs?: No Communication: Independent Dressing (OT): Dependent Is this a change from baseline?: Change from baseline, expected to last >3 days Grooming: Dependent Is this a change from baseline?: Change from baseline, expected to last >3 days Feeding: Needs assistance Is this a change from baseline?: Change from baseline, expected to last >3 days Bathing: Dependent Is this a change from baseline?: Change from baseline, expected to last >3 days  Toileting: Dependent Is this a change from baseline?: Change from baseline, expected to last >3days In/Out Bed: Dependent Is this a change from baseline?: Change from baseline, expected to last >3 days Walks in Home: Dependent(electric scooter) Is this a change from baseline?: Pre-admission baseline Does the  patient have difficulty walking or climbing stairs?: Yes Weakness of Legs: Both Weakness of Arms/Hands: Both  Permission Sought/Granted Permission sought to share information with : Case Manager, Customer service manager, Family Supports Permission granted to share information with : Yes, Verbal Permission Granted  Share Information with NAME: Seth Bake  Permission granted to share info w AGENCY: [place]  granted to share info w Relationship: daughter  Permission granted to share info w Contact Information: [phone]  Emotional Assessment       Orientation: : Oriented to Self, Oriented to Place, Oriented to  Time, Oriented to Situation Alcohol / Substance Use: Not Applicable Psych Involvement: No (comment)  Admission diagnosis:  Hyperkalemia [E87.5] ARF (acute renal failure) (HCC) [N17.9] AKI (acute kidney injury) ([place]) [N17.9] Patient Active Problem List   Diagnosis Date Noted  . Palliative care by specialist   . Goals of care, counseling/discussion   . Hyperkalemia 11/08/2019  . ARF (acute renal failure) ([place]) 11/07/2019  . Acute on chronic diastolic CHF (congestive heart failure) ([place]) 10/16/2019  . CKD (chronic kidney disease), stage III 10/16/2019  . Acute exacerbation of CHF (congestive heart failure) ([place]) 10/16/2019  . Acute on chronic systolic congestive heart failure ([place]) 09/11/2019  . AKI (acute kidney injury) ([place]) 09/11/2019  . Chronic pain syndrome 09/11/2019  . Heart failure ([place]) 09/11/2019  . No-show for appointment 07/08/2019  . Chronic systolic heart failure ([place]) 04/26/2019  . S/P revision of total knee, right 05/14/2018  . Dislocated knee, right, sequela   . S/P revision of total knee 02/17/2018  . Failed total right knee replacement ([place])   . Chronic renal insufficiency, stage 2 (mild) 11/10/2017  . Acute CHF (congestive heart failure) ([place]) 11/10/2017  . Failed total knee arthroplasty, sequela 08/31/2017  . Lower extremity  cellulitis 01/03/2013  . Diabetes mellitus ([place]) 01/03/2013  . Morbid obesity ([place]) 11/12/2006  . TOBACCO DEPENDENCE 11/12/2006  . Depression 11/12/2006  . Essential hypertension 11/12/2006  . GASTROESOPHAGEAL REFLUX, NO ESOPHAGITIS 11/12/2006  . ACNE 11/12/2006  . OSTEOARTHRITIS, LOWER LEG 11/12/2006  . OSA (obstructive sleep apnea) 11/12/2006   PCP:  Vonna Drafts, FNP Pharmacy:   [facility], [place] [address] [place] [address] Phone: [phone] Fax: [phone]  [facility], [place] [address] Phone: [phone] Fax: [place], [place] [address] [place] [address] Phone: [phone] Fax: [phone]  [facility] [place], [place] [address] [place] [address] Phone: [phone] Fax: [phone]     Social Determinants of Health ([place]) Interventions    Readmission Risk Interventions Readmission Risk Prevention Plan 09/13/2019 09/13/2019  Transportation Screening Complete Complete  PCP or Specialist Appt within 5-7 Days Complete Complete  Home Care Screening Complete Complete  Medication Review (RN CM) Referral to Pharmacy Complete  Some recent data might be hidden

## 2019-11-15 NOTE — Progress Notes (Signed)
PROGRESS NOTE    [name]  VEL:[id number] DOB: [birth year] DOA: 11/07/2019 PCP: Vonna Drafts, FNP  Brief Narrative:  54 year old lady with prior history of chronic systolic heart failure, diabetes mellitus, morbid obesity, chronic kidney disease was brought in for bilateral lower extremity edema and worsening back pain due to a fall from the stretcher 2 weeks ago.  On arrival to ED she was found to be in acute kidney injury and acute on chronic systolic heart failure. Nephrology consulted, she was initially scheduled for HD but was cancelled.  She was initially started on high-dose IV Lasix 120 mg 3 times daily and Lasix gtt. without improvement in her renal parameters. further recommendations as per Nephrology. Only 150 ml urine output today. Bladder scan did not reveal any residual urine in the bladder.     Assessment & Plan:   Principal Problem:   ARF (acute renal failure) (HCC) Active Problems:   Essential hypertension   Diabetes mellitus ([place])   Acute on chronic systolic congestive heart failure (HCC)   CKD (chronic kidney disease), stage III   Hyperkalemia   Palliative care by specialist   Goals of care, counseling/discussion   Acute on chronic stage IV kidney disease with hyperkalemia,  CT of the abdomen ruled out obstruction.  Probably cardiorenal syndrome from CHF. Pt had about 425 ml urine in the last 24 hours.    Nephrology consulted, suggests  High risk of progression to dialysis which will be very difficult in view of multiple medical co-morbidities.  Currently no emergent indication of HD, her K appears normal. Continue with high dose of  lasix IV 80 mg bid.  Veltassa for hyperkalemia. Resolved.  Avoid nephrotoxins and monitor renal parameters and urine output. Pt's renal function is not improving despite IV lasix, pt is more lethargic today when compared to yesterday but on waking up she is able to answer all questions appropriately and following commands and  also worked with PT.  Awaiting recommendations from nephrology regarding HD vs increasing IV lasix.     Acute respiratory failure with hypoxia requiring between 2 to 3  L of nasal cannula oxygen Acute on chronic systolic and diastolic heart failure heart failure/nonischemic cardiomyopathy Last echo cardiogram on 09/12/2019 showed ejection fraction of 35 to 40%, global hypokinesis, grade 1 diastolic dysfunction. Being diuresed with IV Lasix 80 mg twice daily. Not much urine output today. Bladder scan did not reveal any residual urine in the bladder.  Continue with daily weights, strict intake and output and fluid restriction to 1200 mL/day Waiting for new recommendations from cardiology.    Acute  Metabolic Encephalopathy: Suspect from ESRD and uremia.  Resolved, patient is alert, oriented x4, though she appears more drowsy when compared to yesterday. She reports she is tired from working with PT.    Morbid obesity:  Body mass index is 90.87 kg/m.   Obstructive sleep apnea On CPAP at night, appear to tolerate well.  Ordered CPAP for home  Elevated troponins Probably secondary to demand ischemia from acute CHF.  Patient currently denies any chest pain or sob.   Myoclonic jerks with some tremors possibly secondary to uremia.  Gabapentin on hold since admission No myoclonic jerks today.    Neck pain CT of the cervical spine was done on 2/23 and it shows 1. No acute osseous abnormality identified in the cervical spine. Solid C4-C7 anterior fusion. Pain control  Back pain CT of the lumbar spine on admission shows No acute osseous abnormality identified  in the lumbar spine. Mid and lower lumbar disc degeneration and advanced facet arthrosis with chronic severe spinal stenosis at L3-4 and at least moderate spinal stenosis at L4-5. Severe left neural foraminal stenosis at L5-S1. Pain control and PT evaluation. PT evaluation recommending SNF.    Diabetes mellitus well controlled  cbg's.  CBG (last 3)  Recent Labs    11/14/19 1643 11/14/19 2107 11/15/19 0621  GLUCAP 158* 141* 130*  Continue with sliding scale insulin at this time.,  A1c is 8.8    Abnormal UA/UTI Completed 5 days of IV rocephin.    Essential hypertension Well controlled BP parameters.    Iron deficiency anemia Probably secondary to anemia of chronic disease from stage IV CKD IV iron given this admission, second dose scheduled for today. ESA given this admission.   Pressure injury present on admission Pressure Injury 11/12/17 Stage II -  Partial thickness loss of dermis presenting as a shallow open ulcer with a red, pink wound bed without slough. 2x1 cm,located in the lower L buttock (Active)  11/12/17 1045  Location: Buttocks  Location Orientation: Left  Staging: Stage II -  Partial thickness loss of dermis presenting as a shallow open ulcer with a red, pink wound bed without slough.  Wound Description (Comments): 2x1 cm,located in the lower L buttock  Present on Admission: Yes     Wound care consulted   In view of the above, AKI, CHF, NICM, poor functional status, poor progression, morbid obesity, poor prognosis from the above medical issues will request for palliative care consult for goals of care. As of now, pt would like to continue with aggressive management and hopeful for a meaning ful recovery. On further discussions she wants to go home with home PT.    Pt reports severe GERD, and has a hard time swallowing , reports early satiety and some times have hard time when food passes down to the stomach:  Esophagogram ordered to evaluate for esophageal strictures in view of her long standing GERD history.  She denies odynophagia or vomiting .  DVT prophylaxis: Subcutaneous heparin Code Status: Full code Family Communication: none at bedside.    disposition Plan:  . Patient is from home, plan for discharge home with home PT.  Marland Kitchen Pt is not safe for discharge home at this time.  Still actively diuresing with high dose of iv LASIX.    Consultants:   Nephrology  Cardiology   palliative care  Procedures: CT of the abdomen without contrast CT of the cervical spine and lumbar spine without contrast  Antimicrobials: completed the course of Rocephin.   Subjective: Slightly drowsy but able to answer all questions. No chest pain or sob, no nausea or vomiting or abdominal pain   Objective: Vitals:   11/14/19 2005 11/15/19 0500 11/15/19 0517 11/15/19 0818  BP: (!) 108/59  101/74 117/62  Pulse: 73  69 71  Resp: 18  18 17   Temp: 97.9 F (36.6 C)  97.9 F (36.6 C) 97.6 F (36.4 C)  TempSrc: Oral  Oral Oral  SpO2: 98%  90% 98%  Weight:  (!) 232.7 kg    Height:        Intake/Output Summary (Last 24 hours) at 11/15/2019 0858 Last data filed at 11/15/2019 0841 Gross per 24 hour  Intake 1140 ml  Output 425 ml  Net 715 ml   Filed Weights   11/13/19 0455 11/14/19 0522 11/15/19 0500  Weight: (!) 237.2 kg (!) 238.6 kg (!) 232.7 kg  Examination:  General exam: Morbidly obese lady on 3 lit of  oxygen, no distress noted.  Respiratory system: Diminished air entry at bases, no adventitious sounds heard. Some tachypnea on talking.  Cardiovascular system: S1S2 heard, RRR, no JVD, bilateral leg edema,  Gastrointestinal system: Abdomen is soft, non tender non distended, bowel sounds. Diffuse abd wall edema Central nervous system: Alert and oriented to person, place , able to answer questions appropriately, following commands.  Extremities Bilateral leg edema present.  Skin: left buttock stage 2 pressure injury.  Psychiatry: Mood is appropriate.     Data Reviewed: I have personally reviewed following labs and imaging studies  CBC: Recent Labs  Lab 11/09/19 0408 11/10/19 0519 11/11/19 0523 11/12/19 0521 11/13/19 0314  WBC 5.4 5.4 5.4 6.5 5.1  HGB 9.7* 9.5* 9.3* 9.0* 8.9*  HCT 33.4* 31.5* 30.4* 29.9* 30.0*  MCV 93.8 92.4 89.9 89.8 90.9  PLT 206 180 179  178 914   Basic Metabolic Panel: Recent Labs  Lab 11/09/19 0408 11/09/19 0408 11/10/19 0519 11/10/19 0519 11/10/19 1457 11/10/19 1942 11/11/19 0523 11/12/19 0521 11/13/19 0314 11/14/19 0905 11/15/19 0425  NA 134*   < > 133*   < > 135   < > 135 135 135 134* 133*  K 5.9*   < > 5.7*   < > 6.6*   < > 4.4 4.0 3.5 3.6 3.5  CL 98   < > 97*   < > 98   < > 97* 98 97* 95* 95*  CO2 24   < > 23   < > 21*   < > 26 27 27 26 27   GLUCOSE 183*   < > 188*   < > 184*   < > 140* 155* 140* 163* 128*  BUN 80*   < > 85*   < > 91*   < > 87* 87* 88* 90* 94*  CREATININE 3.76*   < > 3.83*   < > 4.07*   < > 3.88* 3.79* 3.64* 3.63* 3.99*  CALCIUM 8.9   < > 8.9   < > 8.9   < > 8.9 8.8* 8.6* 8.8* 8.4*  MG 2.3  --  2.5*  --   --   --  2.3 2.3 2.2  --   --   PHOS  --   --   --   --  5.8*  --   --  5.2*  --   --   --    < > = values in this interval not displayed.   GFR: Estimated Creatinine Clearance: 32.4 mL/min (A) (by C-G formula based on SCr of 3.99 mg/dL (H)). Liver Function Tests: Recent Labs  Lab 11/10/19 1457  ALBUMIN 3.4*   No results for input(s): LIPASE, AMYLASE in the last 168 hours. No results for input(s): AMMONIA in the last 168 hours. Coagulation Profile: No results for input(s): INR, PROTIME in the last 168 hours. Cardiac Enzymes: No results for input(s): CKTOTAL, CKMB, CKMBINDEX, TROPONINI in the last 168 hours. BNP (last 3 results) No results for input(s): PROBNP in the last 8760 hours. HbA1C: No results for input(s): HGBA1C in the last 72 hours. CBG: Recent Labs  Lab 11/14/19 0621 11/14/19 1121 11/14/19 1643 11/14/19 2107 11/15/19 0621  GLUCAP 136* 138* 158* 141* 130*   Lipid Profile: No results for input(s): CHOL, HDL, LDLCALC, TRIG, CHOLHDL, LDLDIRECT in the last 72 hours. Thyroid Function Tests: No results for input(s): TSH, T4TOTAL, FREET4, T3FREE, THYROIDAB in the last 72  hours. Anemia Panel: No results for input(s): VITAMINB12, FOLATE, FERRITIN, TIBC, IRON,  RETICCTPCT in the last 72 hours. Sepsis Labs: No results for input(s): PROCALCITON, LATICACIDVEN in the last 168 hours.  Recent Results (from the past 240 hour(s))  SARS CORONAVIRUS 2 (TAT 6-24 HRS) Nasopharyngeal Nasopharyngeal Swab     Status: None   Collection Time: 11/07/19 11:10 PM   Specimen: Nasopharyngeal Swab  Result Value Ref Range Status   SARS Coronavirus 2 NEGATIVE NEGATIVE Final    Comment: (NOTE) SARS-CoV-2 target nucleic acids are NOT DETECTED. The SARS-CoV-2 RNA is generally detectable in upper and lower respiratory specimens during the acute phase of infection. Negative results do not preclude SARS-CoV-2 infection, do not rule out co-infections with other pathogens, and should not be used as the sole basis for treatment or other patient management decisions. Negative results must be combined with clinical observations, patient history, and epidemiological information. The expected result is Negative. Fact Sheet for Patients: SugarRoll.be Fact Sheet for Healthcare Providers: https://www.woods-mathews.com/ This test is not yet approved or cleared by the [place] FDA and  has been authorized for detection and/or diagnosis of SARS-CoV-2 by FDA under an Emergency Use Authorization (EUA). This EUA will remain  in effect (meaning this test can be used) for the duration of the COVID-19 declaration under Section 56 4(b)(1) of the Act, 21 U.S.C. section 360bbb-3(b)(1), unless the authorization is terminated or revoked sooner. Performed at [facility], [place] [address] [place], [place] [address]   Culture, Urine     Status: Abnormal   Collection Time: 11/08/19  7:36 AM   Specimen: Urine, Random  Result Value Ref Range Status   Specimen Description URINE, RANDOM  Final   Special Requests   Final    NONE Performed at [facility], [place] [address] [place], [place] [address]    Culture MULTIPLE SPECIES PRESENT,  SUGGEST RECOLLECTION (A)  Final   Report Status 11/09/2019 FINAL  Final         Radiology Studies: No results found.      Scheduled Meds: . allopurinol  300 mg Oral Daily  . buprenorphine  1 patch Transdermal Q Sat  . Chlorhexidine Gluconate Cloth  6 each Topical Daily  . darbepoetin (ARANESP) injection - NON-DIALYSIS  40 mcg Subcutaneous Q Sun-1800  . furosemide  80 mg Intravenous Q12H  . heparin  5,000 Units Subcutaneous Q8H  . insulin aspart  0-6 Units Subcutaneous TID WC  . isosorbide-hydrALAZINE  1 tablet Oral BID  . metoprolol succinate  25 mg Oral Daily  . oxyCODONE  20 mg Oral Q12H   Continuous Infusions: . sodium chloride 250 mL (11/13/19 1321)  . ferumoxytol       LOS: 8 days        Hosie Poisson, MD [facility]   To contact the attending provider between 7A-7P or the covering provider during after hours 7P-7A, please log into the web site [email or web address] and access using universal [place] password for that web site. If you do not have the password, please call the hospital operator.  11/15/2019, 8:58 AM

## 2019-11-15 NOTE — Progress Notes (Signed)
Occupational Therapy Treatment Patient Details Name: [name] MRN: [id number] DOB: [birth year] Today's Date: 11/15/2019    History of present illness Pt is a 54 y/o female with PMH including but not limited to chronic systolic heart failure, diabetes mellitus, morbid obesity, chronic kidney disease was brought in for bilateral lower extremity edema and worsening back pain due to a fall from the stretcher 2 weeks ago.  On arrival to ED she was found to be in acute kidney injury and acute on chronic systolic heart failure.   OT comments  Patient seen for OT/PT session.  Patient progressing slowly towards goals, completing bed mobility with max assist +2 (returning to supine with total assist +4), sit to stand transfers from elevated EOB with mod assist +2 (3 trials, standing more upright on final trial).  Continues to require total assist for LB ADLs, setup for grooming.  Cognitively, some STM deficits and decreased problem solving/slow processing noted today.  Discussed with RN for need to move to a room with maxisky to optimize OOB time, mobility progression, and safety. Will follow acutely, SNF remains appropriate.    Follow Up Recommendations  SNF    Equipment Recommendations  Other (comment)(TBD at next venue of care )    Recommendations for Other Services      Precautions / Restrictions Precautions Precautions: Fall Restrictions Weight Bearing Restrictions: No       Mobility Bed Mobility Overal bed mobility: Needs Assistance Bed Mobility: Supine to Sit;Sit to Supine     Supine to sit: Max assist;+2 for physical assistance;+2 for safety/equipment Sit to supine: Total assist(+4 to return supine )   General bed mobility comments: Patient transitioned to EOB with max assist +2, requires heavy support for LEs, hips and trunk but able to assist using UEs pulling on bed rails; returned to supine with total assist x 4 but pt able to pull self up to [facility] in trendelenburg with min  assist   Transfers Overall transfer level: Needs assistance Equipment used: Rolling walker (2 wheeled)(bariatric) Transfers: Sit to/from Stand Sit to Stand: Mod assist;+2 physical assistance;+2 safety/equipment;From elevated surface         General transfer comment: from elevated EOB, utilized recliner for support x 2 and RW x 1 to power up to ascend (partial stand x 2 and mostly upright during last trial) and steady with cueing for technique    Balance Overall balance assessment: Needs assistance Sitting-balance support: Feet supported;Bilateral upper extremity supported Sitting balance-Leahy Scale: Poor Sitting balance - Comments: posterior lean initally, once able to move B UEs anteriorly (hands on recliner) transitioned to min guard for safety/balance  Postural control: Posterior lean Standing balance support: Bilateral upper extremity supported;During functional activity Standing balance-Leahy Scale: Poor Standing balance comment: relaint on BUE and external support                           ADL either performed or assessed with clinical judgement   ADL Overall ADL's : Needs assistance/impaired     Grooming: Set up;Bed level;Sitting               Lower Body Dressing: Total assistance;Sit to/from Health and safety inspector Details (indicate cue type and reason): deferred          Functional mobility during ADLs: Maximal assistance;Moderate assistance;+2 for physical assistance;+2 for safety/equipment;Rolling walker General ADL Comments: pt progressing slowly towards goals      Vision  Perception     Praxis      Cognition Arousal/Alertness: Awake/alert Behavior During Therapy: WFL for tasks assessed/performed Overall Cognitive Status: Impaired/Different from baseline Area of Impairment: Problem solving;Memory                     Memory: Decreased short-term memory       Problem Solving: Slow processing;Difficulty  sequencing;Requires verbal cues General Comments: some decreased STM with story repitition during session, cueing for sequencing and problem sovling        Exercises     Shoulder Instructions       General Comments provided level 1 theraband and tied to bed; educated on 2 exercises for pt to complete throughout the day (shoulder flexion and rowing)    Pertinent Vitals/ Pain       Pain Assessment: Faces Faces Pain Scale: No hurt  Home Living                                          Prior Functioning/Environment              Frequency  Min 2X/week        Progress Toward Goals  OT Goals(current goals can now be found in the care plan section)  Progress towards OT goals: Progressing toward goals  Acute Rehab OT Goals Patient Stated Goal: go to rehab OT Goal Formulation: With patient  Plan Discharge plan remains appropriate;Frequency remains appropriate    Co-evaluation    PT/OT/SLP Co-Evaluation/Treatment: Yes Reason for Co-Treatment: For patient/therapist safety;To address functional/ADL transfers   OT goals addressed during session: ADL's and self-care      AM-PAC OT "6 Clicks" Daily Activity     Outcome Measure   Help from another person eating meals?: A Little Help from another person taking care of personal grooming?: A Little Help from another person toileting, which includes using toliet, bedpan, or urinal?: Total Help from another person bathing (including washing, rinsing, drying)?: A Lot Help from another person to put on and taking off regular upper body clothing?: A Lot Help from another person to put on and taking off regular lower body clothing?: Total 6 Click Score: 12    End of Session Equipment Utilized During Treatment: Oxygen  OT Visit Diagnosis: Other abnormalities of gait and mobility (R26.89);Muscle weakness (generalized) (M62.81)   Activity Tolerance Patient tolerated treatment well   Patient Left in bed;with  call bell/phone within reach;with bed alarm set;with nursing/sitter in room   Nurse Communication Mobility status;Other (comment)(need for maxisky and bari recliner)        Time: 1345-1430 OT Time Calculation (min): 45 min  Charges: OT General Charges $OT Visit: 1 Visit OT Treatments $Self Care/Home Management : 23-37 mins  [place] Pager [phone] Office [phone]    Delight Stare 11/15/2019, 2:58 PM

## 2019-11-15 NOTE — Progress Notes (Signed)
Physical Therapy Treatment Patient Details Name: [name] MRN: [id number] DOB: 1966-08-27 Today's Date: 11/15/2019    History of Present Illness Pt is a 54 y/o female with PMH including but not limited to chronic systolic heart failure, diabetes mellitus, morbid obesity, chronic kidney disease was brought in for bilateral lower extremity edema and worsening back pain due to a fall from the stretcher 2 weeks ago.  On arrival to ED she was found to be in acute kidney injury and acute on chronic systolic heart failure.    PT Comments    Patient seen for mobility progression. This session focused on functional transfer training from elevated bed height. Pt requires +2 assist for supine to sit and sit to stands and +4 assist to return to supine. Pt able to partial stand X 2 trials and closer to upright standing third trial using bariatric RW. PT will continue to follow and progress as tolerated.    Follow Up Recommendations  SNF     Equipment Recommendations  None recommended by PT    Recommendations for Other Services       Precautions / Restrictions Precautions Precautions: Fall Restrictions Weight Bearing Restrictions: No    Mobility  Bed Mobility Overal bed mobility: Needs Assistance Bed Mobility: Supine to Sit;Sit to Supine     Supine to sit: Max assist;+2 for physical assistance;+2 for safety/equipment Sit to supine: Total assist(+4 to return supine )   General bed mobility comments: Patient transitioned to EOB with max assist +2, requires heavy support for LEs, hips and trunk but able to assist using UEs pulling on bed rails; returned to supine with total assist x 4 but pt able to pull self up to [facility] in trendelenburg with min assist   Transfers Overall transfer level: Needs assistance Equipment used: Rolling walker (2 wheeled)(bariatric RW and back of recliner) Transfers: Sit to/from Stand Sit to Stand: Mod assist;+2 physical assistance;+2 safety/equipment;From  elevated surface         General transfer comment: from elevated EOB, utilized recliner for support x 2 and RW x 1 to power up to ascend (partial stand x 2 and mostly upright during last trial); cues for positioning prior to each trial and assist to increased bilat knee flexion and use of bed pad/sheet for hip extension  Ambulation/Gait                 Stairs             Wheelchair Mobility    Modified Rankin (Stroke Patients Only)       Balance Overall balance assessment: Needs assistance Sitting-balance support: Feet supported;Bilateral upper extremity supported Sitting balance-Leahy Scale: Poor Sitting balance - Comments: posterior lean initally, once able to move B UEs anteriorly (hands on recliner) transitioned to min guard for safety/balance  Postural control: Posterior lean Standing balance support: Bilateral upper extremity supported;During functional activity Standing balance-Leahy Scale: Poor Standing balance comment: relaint on BUE and external support                            Cognition Arousal/Alertness: Awake/alert Behavior During Therapy: WFL for tasks assessed/performed Overall Cognitive Status: Impaired/Different from baseline Area of Impairment: Problem solving;Memory                     Memory: Decreased short-term memory       Problem Solving: Slow processing;Difficulty sequencing;Requires verbal cues General Comments: some decreased STM with story  repitition during session, cueing for sequencing and problem sovling      Exercises      General Comments General comments (skin integrity, edema, etc.): provided level 1 theraband and tied to bed; educated on 2 exercises for pt to complete throughout the day (shoulder flexion and rowing)      Pertinent Vitals/Pain Pain Assessment: Faces Faces Pain Scale: No hurt    Home Living                      Prior Function            PT Goals (current goals  can now be found in the care plan section) Acute Rehab PT Goals Patient Stated Goal: go to rehab Progress towards PT goals: Progressing toward goals    Frequency    Min 2X/week      PT Plan Current plan remains appropriate    Co-evaluation PT/OT/SLP Co-Evaluation/Treatment: Yes Reason for Co-Treatment: For patient/therapist safety;To address functional/ADL transfers PT goals addressed during session: Mobility/safety with mobility OT goals addressed during session: ADL's and self-care      AM-PAC PT "6 Clicks" Mobility   Outcome Measure  Help needed turning from your back to your side while in a flat bed without using bedrails?: A Lot Help needed moving from lying on your back to sitting on the side of a flat bed without using bedrails?: A Lot Help needed moving to and from a bed to a chair (including a wheelchair)?: Total Help needed standing up from a chair using your arms (e.g., wheelchair or bedside chair)?: Total Help needed to walk in hospital room?: Total Help needed climbing 3-5 steps with a railing? : Total 6 Click Score: 8    End of Session Equipment Utilized During Treatment: Oxygen Activity Tolerance: Patient tolerated treatment well Patient left: in bed;with call bell/phone within reach Nurse Communication: Mobility status PT Visit Diagnosis: Other abnormalities of gait and mobility (R26.89);Muscle weakness (generalized) (M62.81)     Time: 5093-2671 PT Time Calculation (min) (ACUTE ONLY): 50 min  Charges:  $Gait Training: 8-22 mins                     Earney Navy, PTA Acute Rehabilitation Services Pager: [phone] Office: [phone]     Darliss Cheney 11/15/2019, 5:06 PM

## 2019-11-15 NOTE — Progress Notes (Signed)
Attempt to bladder scan pt was not successful.  bladder scan could not show data. Pt's abdomen is tight with generalized edema.

## 2019-11-15 NOTE — Progress Notes (Signed)
[facility] Progress Note   Pt is a 54 y.o. yo female with morbid obesity, DM, systolic heart failure who was admitted on 11/07/2019 with pain after a fall, A on CRF with volume overload  Assessment/ Plan:   1. Renal-  Progressive decline in renal function over the past 3 years, most recently with a creatinine between 1.8 and 2.0. Presenting creatinine was 3.6- peaked at 4-  Is 3.6 today.  Non oliguric. No hydro on imaging-  U/A with proteinuria, pyuria and microscopic hematuria- additional work up limited by body size.  Mother was a dialysis patient who died in 2018/01/01.  There are no absolute indications for dialysis-  Really unclear if would be possible given 240 kg and basically bed ridden status but supposedly  was ambulatory 6 mos ago. She was able to stand with a walker with PT thi sAM.   Feels that if her volume status was better she could ambulate again  - Renal function worse today and still has a lot of fluid onboard. I am having the nurse check a bladder scan to see if foley clogged up (only 45 mL since this AM).  2. HTN/vol-  Very difficult to determine volume status but does seem overloaded.  BP reading well/low but unable to determine accuracy - is on toprol and bidil-   Based on PE she needs scheduled lasix -> continue  80 IV q 12 hours.  3. Anemia- giving iron  and ESA- hgb around 9-  Rx  second dose of feraheme 3/2. 4. Hyperkalemia-  Was an issue earlier in hosp- now WNL-  No meds 5. Dispo-  Very difficult situation-  Palliative care is involved.  Not willing to commit to code status.  As stated previously dialysis would be logistically very difficult but after talking to her I was more encouraged, she has a good attitude and wants to live  Subjective:   She feels better today and was actually standing with the aid of a walker with PT.   Objective:   BP 117/62 (BP Location: Right Arm)   Pulse 71   Temp 97.6 F (36.4 C) (Oral)   Resp 17   Ht 5\' 3"  (1.6 m)   Wt  (!) 232.7 kg   LMP 11/09/2012   SpO2 98%   BMI 90.87 kg/m   Intake/Output Summary (Last 24 hours) at 11/15/2019 1436 Last data filed at 11/15/2019 1230 Gross per 24 hour  Intake 800 ml  Output 325 ml  Net 475 ml   Weight change: -5.897 kg  Physical Exam: General: alert, clean lunch tray Heart: RRR Lungs: mostly clear Abdomen: obese, abdominal wall edema Extremities: pitting edema  Imaging: No results found.  Labs: BMET Recent Labs  Lab 11/10/19 1457 11/10/19 1942 11/11/19 0523 11/12/19 0521 11/13/19 0314 11/14/19 0905 11/15/19 0425  NA 135 135 135 135 135 134* 133*  K 6.6* 5.3* 4.4 4.0 3.5 3.6 3.5  CL 98 97* 97* 98 97* 95* 95*  CO2 21* 25 26 27 27 26 27   GLUCOSE 184* 178* 140* 155* 140* 163* 128*  BUN 91* 87* 87* 87* 88* 90* 94*  CREATININE 4.07* 3.98* 3.88* 3.79* 3.64* 3.63* 3.99*  CALCIUM 8.9 9.0 8.9 8.8* 8.6* 8.8* 8.4*  PHOS 5.8*  --   --  5.2*  --   --   --    CBC Recent Labs  Lab 11/11/19 0523 11/12/19 0521 11/13/19 0314 11/15/19 1225  WBC 5.4 6.5 5.1 5.0  NEUTROABS  --   --   --  3.3  HGB 9.3* 9.0* 8.9* 9.6*  HCT 30.4* 29.9* 30.0* 31.8*  MCV 89.9 89.8 90.9 90.6  PLT 179 178 154 166    Medications:    . allopurinol  300 mg Oral Daily  . buprenorphine  1 patch Transdermal Q Sat  . Chlorhexidine Gluconate Cloth  6 each Topical Daily  . darbepoetin (ARANESP) injection - NON-DIALYSIS  40 mcg Subcutaneous Q Sun-1800  . furosemide  80 mg Intravenous Q12H  . heparin  5,000 Units Subcutaneous Q8H  . insulin aspart  0-6 Units Subcutaneous TID WC  . isosorbide-hydrALAZINE  1 tablet Oral BID  . metoprolol succinate  25 mg Oral Daily  . oxyCODONE  20 mg Oral Q12H      Otelia Santee, MD 11/15/2019, 2:36 PM

## 2019-11-16 ENCOUNTER — Inpatient Hospital Stay (HOSPITAL_COMMUNITY): Payer: Medicare Other

## 2019-11-16 ENCOUNTER — Other Ambulatory Visit: Payer: Self-pay | Admitting: Cardiology

## 2019-11-16 LAB — GLUCOSE, CAPILLARY
Glucose-Capillary: 132 mg/dL — ABNORMAL HIGH (ref 70–99)
Glucose-Capillary: 128 mg/dL — ABNORMAL HIGH (ref 70–99)
Glucose-Capillary: 147 mg/dL — ABNORMAL HIGH (ref 70–99)
Glucose-Capillary: 163 mg/dL — ABNORMAL HIGH (ref 70–99)

## 2019-11-16 LAB — BASIC METABOLIC PANEL
Anion gap: 13 (ref 5–15)
BUN: 98 mg/dL — ABNORMAL HIGH (ref 6–20)
CO2: 26 mmol/L (ref 22–32)
Calcium: 8.4 mg/dL — ABNORMAL LOW (ref 8.9–10.3)
Chloride: 94 mmol/L — ABNORMAL LOW (ref 98–111)
Creatinine, Ser: 4.23 mg/dL — ABNORMAL HIGH (ref 0.44–1.00)
GFR calc Af Amer: 13 mL/min — ABNORMAL LOW (ref >60)
GFR calc non Af Amer: 11 mL/min — ABNORMAL LOW (ref >60)
Glucose, Bld: 142 mg/dL — ABNORMAL HIGH (ref 70–99)
Potassium: 3.5 mmol/L (ref 3.5–5.1)
Sodium: 133 mmol/L — ABNORMAL LOW (ref 135–145)

## 2019-11-16 MED ORDER — OXYCODONE HCL ER 10 MG PO T12A: 10.0000 mg | EXTENDED_RELEASE_TABLET | Freq: Two times a day (BID) | ORAL | Status: DC

## 2019-11-16 MED ORDER — FUROSEMIDE 10 MG/ML IJ SOLN
120.0000 mg | Freq: Two times a day (BID) | INTRAVENOUS | Status: DC
  Administered 2019-11-16 – 2019-11-18 (×4): 120 mg via INTRAVENOUS
  Filled 2019-11-16: qty 12
  Filled 2019-11-16: qty 10
  Filled 2019-11-16: qty 12
  Filled 2019-11-16: qty 10
  Filled 2019-11-16: qty 2

## 2019-11-16 NOTE — Progress Notes (Signed)
Pt for procedure of the esophagus off the unit. Transferred via bed w/ transport escort. AAOx4, nad. O2 2l [place] tolerated well.

## 2019-11-16 NOTE — Progress Notes (Signed)
PROGRESS NOTE    CHEYLA DUCHEMIN  SAY:[id number] DOB: [birth year] DOA: 11/07/2019 PCP: Vonna Drafts, FNP  Brief Narrative:  54 year old morbidly obese female chronic systolic heart failure, diabetes mellitus, morbid obesity, chronic kidney disease was brought in for bilateral lower extremity edema and worsening back pain due to a fall from the stretcher 2 weeks ago.  On arrival to ED she was found to be in acute kidney injury and acute on chronic systolic heart failure. Nephrology consulted, she was initially scheduled for HD but was cancelled.  She was initially started on high-dose IV Lasix   Assessment & Plan:   Acute on chronic stage IV kidney disease with hyperkalemia,  Acute on chronic systolic and diastolic CHF, EF 23% Cardiorenal syndrome -Baseline creatinine around 2, on admission was 3.6 which peaked to 4 -Nephrology following, no evidence of hydronephrosis -Initially there was plan for HD, subsequently this was held given transient improvement in urine output -Continues to have significant volume overload with anasarca and poor response to diuretics -very poor longterm HD candidate -Increase Lasix dose again 120 mg twice daily  AKI on CKD stage III Hyperkalemia -As above -Would be a poor long-term dialysis candidate given super morbid obesity, debility, gait disorder  Acute respiratory failure with hypoxia  Acute on chronic systolic and diastolic CHF Super morbid obesity, OSA OHS -Diuretics as noted above, increase Lasix dose -Echo in December showed noted EF of 35% with global hypokinesis, RV and valves not well visualized, at least moderate pulmonary hypertension -Continue CPAP nightly and daytime naps -Wean O2 as tolerated  Acute  Metabolic Encephalopathy: Suspect from ESRD and uremia.  -Improved, but does have increased somnolence -Discontinue OxyContin  Super morbid obesity:  Body mass index is 90.34 kg/m.  Myoclonic jerks with some tremors  -Likely  secondary to uremia.  Gabapentin on hold since admission  Chronic neck, back pain CT of the cervical spine was done on 2/23 and it shows 1. No acute osseous abnormality identified in the cervical spine. Solid C4-C7 anterior fusion. -CT of the lumbar spine on admission shows No acute osseous abnormality identified in the lumbar spine. Mid and lower lumbar disc degeneration and advanced facet arthrosis with chronic severe spinal stenosis at L3-4 and at least moderate spinal stenosis at L4-5. Severe left neural foraminal stenosis at L5-S1. Pain control and PT evaluation. PT evaluation recommending SNF.  -Discontinued OxyContin, low-dose Percocet as needed per home regimen hold for sedation  Diabetes mellitus  -Hemoglobin A1c is 8.8  -CBG stable, continue sliding scale insulin   Abnormal UA/possibly asymptomatic pyuria Completed 5 days of IV rocephin last week  Iron deficiency anemia  anemia of chronic disease from stage IV CKD IV iron given this admission, -continue epo  Odynophagia Pt reports severe GERD, and has a hard time swallowing , reports early satiety and some times have hard time when food passes down to the stomach:  Esophagogram ordered to evaluate for esophageal strictures in view of her long standing GERD history.  She denies odynophagia or vomiting .  DVT prophylaxis: Subcutaneous heparin Code Status: Full code Family Communication: No family at bedside, will update daughter   disposition Plan: Not stable for discharge is massively fluid overloaded with anasarca, poor response to diuretics, not a good dialysis candidate   Pressure injury present on admission Pressure Injury 11/12/17 Stage II -  Partial thickness loss of dermis presenting as a shallow open ulcer with a red, pink wound bed without slough. 2x1 cm,located in the lower  L buttock (Active)  11/12/17 1045  Location: Buttocks  Location Orientation: Left  Staging: Stage II -  Partial thickness loss of dermis  presenting as a shallow open ulcer with a red, pink wound bed without slough.  Wound Description (Comments): 2x1 cm,located in the lower L buttock  Present on Admission: Yes     Wound care consulted   Consultants:   Nephrology  Cardiology   palliative care  Procedures: CT of the abdomen without contrast CT of the cervical spine and lumbar spine without contrast  Antimicrobials: completed the course of Rocephin.   Subjective: -feels ok, hurts all over -dyspnea, unable to get out of bed  Objective: Vitals:   11/15/19 2111 11/16/19 0120 11/16/19 0559 11/16/19 0645  BP: 98/60  (!) 105/58   Pulse: 76  70   Resp: 20  20   Temp: 98 F (36.7 C)  97.6 F (36.4 C)   TempSrc:   Oral   SpO2: 97% 93% 98%   Weight:    (!) 231.3 kg  Height:        Intake/Output Summary (Last 24 hours) at 11/16/2019 1243 Last data filed at 11/16/2019 0700 Gross per 24 hour  Intake 598.76 ml  Output 250 ml  Net 348.76 ml   Filed Weights   11/14/19 0522 11/15/19 0500 11/16/19 0645  Weight: (!) 238.6 kg (!) 232.7 kg (!) 231.3 kg    Examination:  Gen: Chronically ill morbidly obese female lying in bed, awake alert oriented x3 HEENT: Obese, unable to assess JVD Lungs: Distant breath sounds CVS: RRR,No Gallops,Rubs or new Murmurs Abd: soft, Non tender, non distended, BS present Extremities: Massive lower extremity edema extending to upper thigh, abdominal wall  Skin: left buttock stage 2 pressure injury.  Psychiatry: Mood is appropriate.     Data Reviewed: I have personally reviewed following labs and imaging studies  CBC: Recent Labs  Lab 11/10/19 0519 11/11/19 0523 11/12/19 0521 11/13/19 0314 11/15/19 1225  WBC 5.4 5.4 6.5 5.1 5.0  NEUTROABS  --   --   --   --  3.3  HGB 9.5* 9.3* 9.0* 8.9* 9.6*  HCT 31.5* 30.4* 29.9* 30.0* 31.8*  MCV 92.4 89.9 89.8 90.9 90.6  PLT 180 179 178 154 947   Basic Metabolic Panel: Recent Labs  Lab 11/10/19 0519 11/10/19 0519 11/10/19 1457  11/10/19 1942 11/11/19 0523 11/11/19 0523 11/12/19 0521 11/13/19 0314 11/14/19 0905 11/15/19 0425 11/16/19 0538  NA 133*   < > 135   < > 135   < > 135 135 134* 133* 133*  K 5.7*   < > 6.6*   < > 4.4   < > 4.0 3.5 3.6 3.5 3.5  CL 97*   < > 98   < > 97*   < > 98 97* 95* 95* 94*  CO2 23   < > 21*   < > 26   < > 27 27 26 27 26   GLUCOSE 188*   < > 184*   < > 140*   < > 155* 140* 163* 128* 142*  BUN 85*   < > 91*   < > 87*   < > 87* 88* 90* 94* 98*  CREATININE 3.83*   < > 4.07*   < > 3.88*   < > 3.79* 3.64* 3.63* 3.99* 4.23*  CALCIUM 8.9   < > 8.9   < > 8.9   < > 8.8* 8.6* 8.8* 8.4* 8.4*  MG 2.5*  --   --   --  2.3  --  2.3 2.2  --   --   --   PHOS  --   --  5.8*  --   --   --  5.2*  --   --   --   --    < > = values in this interval not displayed.   GFR: Estimated Creatinine Clearance: 30.5 mL/min (A) (by C-G formula based on SCr of 4.23 mg/dL (H)). Liver Function Tests: Recent Labs  Lab 11/10/19 1457  ALBUMIN 3.4*   No results for input(s): LIPASE, AMYLASE in the last 168 hours. No results for input(s): AMMONIA in the last 168 hours. Coagulation Profile: No results for input(s): INR, PROTIME in the last 168 hours. Cardiac Enzymes: No results for input(s): CKTOTAL, CKMB, CKMBINDEX, TROPONINI in the last 168 hours. BNP (last 3 results) No results for input(s): PROBNP in the last 8760 hours. HbA1C: No results for input(s): HGBA1C in the last 72 hours. CBG: Recent Labs  Lab 11/15/19 1108 11/15/19 1631 11/15/19 2115 11/16/19 0606 11/16/19 1200  GLUCAP 164* 161* 145* 132* 128*   Lipid Profile: No results for input(s): CHOL, HDL, LDLCALC, TRIG, CHOLHDL, LDLDIRECT in the last 72 hours. Thyroid Function Tests: No results for input(s): TSH, T4TOTAL, FREET4, T3FREE, THYROIDAB in the last 72 hours. Anemia Panel: No results for input(s): VITAMINB12, FOLATE, FERRITIN, TIBC, IRON, RETICCTPCT in the last 72 hours. Sepsis Labs: No results for input(s): PROCALCITON, LATICACIDVEN in  the last 168 hours.  Recent Results (from the past 240 hour(s))  SARS CORONAVIRUS 2 (TAT 6-24 HRS) Nasopharyngeal Nasopharyngeal Swab     Status: None   Collection Time: 11/07/19 11:10 PM   Specimen: Nasopharyngeal Swab  Result Value Ref Range Status   SARS Coronavirus 2 NEGATIVE NEGATIVE Final    Comment: (NOTE) SARS-CoV-2 target nucleic acids are NOT DETECTED. The SARS-CoV-2 RNA is generally detectable in upper and lower respiratory specimens during the acute phase of infection. Negative results do not preclude SARS-CoV-2 infection, do not rule out co-infections with other pathogens, and should not be used as the sole basis for treatment or other patient management decisions. Negative results must be combined with clinical observations, patient history, and epidemiological information. The expected result is Negative. Fact Sheet for Patients: SugarRoll.be Fact Sheet for Healthcare Providers: https://www.woods-mathews.com/ This test is not yet approved or cleared by the [place] FDA and  has been authorized for detection and/or diagnosis of SARS-CoV-2 by FDA under an Emergency Use Authorization (EUA). This EUA will remain  in effect (meaning this test can be used) for the duration of the COVID-19 declaration under Section 56 4(b)(1) of the Act, 21 U.S.C. section 360bbb-3(b)(1), unless the authorization is terminated or revoked sooner. Performed at [facility], [place] [address] [place], [place] [address]   Culture, Urine     Status: Abnormal   Collection Time: 11/08/19  7:36 AM   Specimen: Urine, Random  Result Value Ref Range Status   Specimen Description URINE, RANDOM  Final   Special Requests   Final    NONE Performed at [facility], [place] [address] [place],  [address]    Culture MULTIPLE SPECIES PRESENT, SUGGEST RECOLLECTION (A)  Final   Report Status 11/09/2019 FINAL  Final         Radiology  Studies: DG ESOPHAGUS W SINGLE CM (SOL OR THIN BA)  Result Date: 11/16/2019 CLINICAL DATA:  Chronic reflux. Abdominal distension. EXAM: ESOPHOGRAM/BARIUM SWALLOW TECHNIQUE: Single contrast examination was performed using  thin barium. FLUOROSCOPY TIME:  Fluoroscopy Time: 54 seconds of low-dose pulsed fluoroscopy Radiation Exposure Index (if provided by the fluoroscopic device): 26.2 mGy Number of Acquired Spot Images: 0 COMPARISON:  Chest CT 03/04/2011. Abdominal CT 11/08/2019. FINDINGS: Study is limited by the patient's body habitus and limited mobility. Examination was performed in the supine and semi erect positions. The foot board would not support the patient's weight for erect imaging. The esophageal motility appears normal. No abnormality of the cervical esophagus identified. There is no evidence aspiration. Patient is status post cervical fusion. No focal abnormality of the thoracic esophagus identified. There is mild luminal narrowing at the gastroesophageal junction. A 13 mm barium tablet was administered and passed rapidly into the distal esophagus. However, despite multiple swallows of water and thin barium, the tablet did not pass through the gastroesophageal junction with the patient in the semi erect position. No gross narrowing of the esophageal lumen at level of retention identified. IMPRESSION: 1. Limited examination. 2. Mild luminal narrowing at the gastroesophageal junction without mucosal irregularity. A 13 mm barium tablet did not pass through the distal esophagus with the patient in the semi erect position. This may be related to the patient's body habitus and limited mobility. Electronically Signed   By: Richardean Sale M.D.   On: 11/16/2019 11:46        Scheduled Meds: . allopurinol  300 mg Oral Daily  . buprenorphine  1 patch Transdermal Q Sat  . Chlorhexidine Gluconate Cloth  6 each Topical Daily  . darbepoetin (ARANESP) injection - NON-DIALYSIS  40 mcg Subcutaneous Q Sun-1800   . heparin  5,000 Units Subcutaneous Q8H  . insulin aspart  0-6 Units Subcutaneous TID WC  . isosorbide-hydrALAZINE  1 tablet Oral BID  . metoprolol succinate  25 mg Oral Daily   Continuous Infusions: . sodium chloride 250 mL (11/13/19 1321)  . furosemide       LOS: 9 days    Domenic Polite, MD [facility]  11/16/2019, 12:43 PM

## 2019-11-16 NOTE — Progress Notes (Signed)
Pt returns from esophagus procedure in bed, AAOx4, nad. Pt said she is ready for lunch having been npo since last night. Will order meal tray.

## 2019-11-16 NOTE — Progress Notes (Signed)
Patient ID: Ena Dawley, female   DOB: [birth year], 54 y.o.   MRN: [id number] S: Pt a little tired after esophogram/barium swallow this morning which was unfortunately unsuccessful.  She denies any N/V/anorexia or SOB O:BP (!) 105/58 (BP Location: Left Arm)   Pulse 70   Temp 97.6 F (36.4 C) (Oral)   Resp 20   Ht 5\' 3"  (1.6 m)   Wt (!) 231.3 kg   LMP 11/09/2012   SpO2 98%   BMI 90.34 kg/m   Intake/Output Summary (Last 24 hours) at 11/16/2019 1314 Last data filed at 11/16/2019 0700 Gross per 24 hour  Intake 598.76 ml  Output 250 ml  Net 348.76 ml   Intake/Output: I/O last 3 completed shifts: In: 1178.8 [P.O.:1060; IV Piggyback:118.8] Out: 475 [Urine:475]  Intake/Output this shift:  No intake/output data recorded. Weight change: -1.361 kg Gen: morbidly obese woman lying in bed in NAD [id number] HS, no rub Resp: decreased BS  Abd: obese, 2+ pitting edema of lower abdomen/panus  Ext: 2+ brawny edema bilaterally/anasarca to mid abomen  Recent Labs  Lab 11/10/19 1457 11/10/19 1457 11/10/19 1942 11/11/19 0523 11/12/19 0521 11/13/19 0314 11/14/19 0905 11/15/19 0425 11/16/19 0538  NA 135   < > 135 135 135 135 134* 133* 133*  K 6.6*   < > 5.3* 4.4 4.0 3.5 3.6 3.5 3.5  CL 98   < > 97* 97* 98 97* 95* 95* 94*  CO2 21*   < > 25 26 27 27 26 27 26   GLUCOSE 184*   < > 178* 140* 155* 140* 163* 128* 142*  BUN 91*   < > 87* 87* 87* 88* 90* 94* 98*  CREATININE 4.07*   < > 3.98* 3.88* 3.79* 3.64* 3.63* 3.99* 4.23*  ALBUMIN 3.4*  --   --   --   --   --   --   --   --   CALCIUM 8.9   < > 9.0 8.9 8.8* 8.6* 8.8* 8.4* 8.4*  PHOS 5.8*  --   --   --  5.2*  --   --   --   --    < > = values in this interval not displayed.   Liver Function Tests: Recent Labs  Lab 11/10/19 1457  ALBUMIN 3.4*   No results for input(s): LIPASE, AMYLASE in the last 168 hours. No results for input(s): AMMONIA in the last 168 hours. CBC: Recent Labs  Lab 11/10/19 0519 11/10/19 0519 11/11/19 0523  11/11/19 0523 11/12/19 0521 11/13/19 0314 11/15/19 1225  WBC 5.4   < > 5.4   < > 6.5 5.1 5.0  NEUTROABS  --   --   --   --   --   --  3.3  HGB 9.5*   < > 9.3*   < > 9.0* 8.9* 9.6*  HCT 31.5*   < > 30.4*   < > 29.9* 30.0* 31.8*  MCV 92.4  --  89.9  --  89.8 90.9 90.6  PLT 180   < > 179   < > 178 154 166   < > = values in this interval not displayed.   Cardiac Enzymes: No results for input(s): CKTOTAL, CKMB, CKMBINDEX, TROPONINI in the last 168 hours. CBG: Recent Labs  Lab 11/15/19 1108 11/15/19 1631 11/15/19 2115 11/16/19 0606 11/16/19 1200  GLUCAP 164* 161* 145* 132* 128*    Iron Studies: No results for input(s): IRON, TIBC, TRANSFERRIN, FERRITIN in the last 72 hours. Studies/Results: DG  ESOPHAGUS W SINGLE CM (SOL OR THIN BA)  Result Date: 11/16/2019 CLINICAL DATA:  Chronic reflux. Abdominal distension. EXAM: ESOPHOGRAM/BARIUM SWALLOW TECHNIQUE: Single contrast examination was performed using  thin barium. FLUOROSCOPY TIME:  Fluoroscopy Time: 54 seconds of low-dose pulsed fluoroscopy Radiation Exposure Index (if provided by the fluoroscopic device): 26.2 mGy Number of Acquired Spot Images: 0 COMPARISON:  Chest CT 03/04/2011. Abdominal CT 11/08/2019. FINDINGS: Study is limited by the patient's body habitus and limited mobility. Examination was performed in the supine and semi erect positions. The foot board would not support the patient's weight for erect imaging. The esophageal motility appears normal. No abnormality of the cervical esophagus identified. There is no evidence aspiration. Patient is status post cervical fusion. No focal abnormality of the thoracic esophagus identified. There is mild luminal narrowing at the gastroesophageal junction. A 13 mm barium tablet was administered and passed rapidly into the distal esophagus. However, despite multiple swallows of water and thin barium, the tablet did not pass through the gastroesophageal junction with the patient in the semi erect  position. No gross narrowing of the esophageal lumen at level of retention identified. IMPRESSION: 1. Limited examination. 2. Mild luminal narrowing at the gastroesophageal junction without mucosal irregularity. A 13 mm barium tablet did not pass through the distal esophagus with the patient in the semi erect position. This may be related to the patient's body habitus and limited mobility. Electronically Signed   By: Richardean Sale M.D.   On: 11/16/2019 11:46   . allopurinol  300 mg Oral Daily  . buprenorphine  1 patch Transdermal Q Sat  . Chlorhexidine Gluconate Cloth  6 each Topical Daily  . darbepoetin (ARANESP) injection - NON-DIALYSIS  40 mcg Subcutaneous Q Sun-1800  . heparin  5,000 Units Subcutaneous Q8H  . insulin aspart  0-6 Units Subcutaneous TID WC  . isosorbide-hydrALAZINE  1 tablet Oral BID  . metoprolol succinate  25 mg Oral Daily    BMET    Component Value Date/Time   NA 133 (L) 11/16/2019 0538   NA 143 05/15/2014 0321   K 3.5 11/16/2019 0538   K 3.7 05/15/2014 0321   CL 94 (L) 11/16/2019 0538   CL 108 (H) 05/15/2014 0321   CO2 26 11/16/2019 0538   CO2 30 05/15/2014 0321   GLUCOSE 142 (H) 11/16/2019 0538   GLUCOSE 123 (H) 05/15/2014 0321   BUN 98 (H) 11/16/2019 0538   BUN 8 05/15/2014 0321   CREATININE 4.23 (H) 11/16/2019 0538   CREATININE 0.74 05/15/2014 0321   CALCIUM 8.4 (L) 11/16/2019 0538   CALCIUM 7.6 (L) 05/15/2014 0321   GFRNONAA 11 (L) 11/16/2019 0538   GFRNONAA >60 05/15/2014 0321   GFRAA 13 (L) 11/16/2019 0538   GFRAA >60 05/15/2014 0321   CBC    Component Value Date/Time   WBC 5.0 11/15/2019 1225   RBC 3.51 (L) 11/15/2019 1225   HGB 9.6 (L) 11/15/2019 1225   HGB 10.9 (L) 05/15/2014 0321   HCT 31.8 (L) 11/15/2019 1225   HCT 33.9 (L) 05/15/2014 0321   PLT 166 11/15/2019 1225   PLT 174 05/15/2014 0321   MCV 90.6 11/15/2019 1225   MCV 93 05/15/2014 0321   MCH 27.4 11/15/2019 1225   MCHC 30.2 11/15/2019 1225   RDW 17.1 (H) 11/15/2019 1225    RDW 13.9 05/15/2014 0321   LYMPHSABS 0.8 11/15/2019 1225   LYMPHSABS 2.3 05/15/2014 0321   MONOABS 0.8 11/15/2019 1225   MONOABS 0.7 05/15/2014 0321   EOSABS  0.1 11/15/2019 1225   EOSABS 0.1 05/15/2014 0321   BASOSABS 0.0 11/15/2019 1225   BASOSABS 0.0 05/15/2014 0321     Assessment/Plan:  1. AKI/CKD stage 3b- in setting of decompensated combined systolic and diastolic CHF and likely cardiorenal syndrome (low FeNa).  She had initially stabilized her Scr and reached a nadir of 3.63 on 11/14/19.  Unfortunately she has been more hypotensive with SBP's in the 90's over the past 48 hours and her BUN and Cr have started to climb with decreased UOP from her foley catheter.  Bladder scan was not successful due to significant anasarca of her soft tissue/panus.  Her lasix dose was increased to 120 mg IV (to start this afternoon). Now likely has ischemic ATN component to cardiorenal syndrome. 1. Difficult situation as she is not currently a candidate for outpatient dialysis given her super morbid obesity and bed-bound status.  I dont think we have a chair in the outpatient setting that could accommodate someone weighing over 500 lbs, nor would a hoyer lift handle that weight.  I discussed with her that she would likely require placement in a longterm care facility that would provide HD in a bed as well as PT/OT for deconditioning.  I still don't know if she would be able to lose enough body weight (even with UF) to get below the 350 lb max. 2. Palliative care has been consulted and appreciate ongoing discussions with the family 3. Thankfully no urgent indication for dialysis at this time, however if she does not respond to increased doses of lasix we may need to discuss goals/limits of care soon. 2. Acute on chronic combined diastolic and systolic CHF- massively volume overloaded and not responding to present dose of IV lasix and agree with increasing to 120 mg bid and follow.  No respiratory distress at this  time. 3. Acute respiratory failure with hypoxia- in settings decompensated CHF and super morbid obesity/OSA OHS- currently on 3 liters via [place]. 4. Anemia of CKD- s/p IV feraheme x 2 (last dose given 11/15/19) and ESA. 5. Super morbid obesity- basically remains bed bound for months  Donetta Potts, MD [facility] [phone]

## 2019-11-16 NOTE — TOC Progression Note (Signed)
Transition of Care [facility]) - Progression Note    Patient Details  Name: [name] MRN: [id number] Date of Birth: [birth year]  [facility]) CM/SW Contact  Zenon Mayo, RN Phone Number: 11/16/2019, 3:57 PM  Clinical Narrative:    NCM spoke with patient about the rollator, she would have to private pay for rollator since she received a rolling walker before, she states that's ok she does not want the rollator.  She states she had a sleep study at [facility] last Sept, [place] gave this information to [employer] with [employer] so that he could process the cpap for patient.  Patient also states she has a nebulizer at home but need the medications to be refilled for the machine, NCM notified MD of this information, patient is no where close to dc yet per MD.   Patient is active with [facility], [place], will need to add [place], will need ptar transport at discharge address was confirmed.  TOC team will continue to follow for dc needs.   Expected Discharge Plan: [place] Barriers to Discharge: Continued Medical Work up  Expected Discharge Plan and Services Expected Discharge Plan: [place] arrangements for the past 2 months: Single Family Home                 DME Arranged: Walker rolling with seat DME Agency: [employer] Date DME Agency Contacted: 11/15/19 Time DME Agency Contacted: [phone] Representative spoke with at DME Agency: [place]: PT, OT, RN [place] Agency: [facility] Date [place]: 11/15/19 Time [place]: [place] Representative spoke with at [place]: [place] ([place]) Interventions    Readmission Risk Interventions Readmission Risk Prevention Plan 09/13/2019 09/13/2019  Transportation Screening Complete Complete  PCP or Specialist Appt within 5-7 Days Complete Complete  Home Care Screening  Complete Complete  Medication Review (RN CM) Referral to Pharmacy Complete  Some recent data might be hidden

## 2019-11-16 NOTE — Progress Notes (Signed)
Bedside report done, assumed care. Pt presents lying supine on air mattress, hob 30-40 degrees, AAOx4, nad, no voiced c/o. Will con't to monitor.

## 2019-11-17 ENCOUNTER — Inpatient Hospital Stay (HOSPITAL_COMMUNITY): Payer: Medicare Other

## 2019-11-17 ENCOUNTER — Ambulatory Visit: Payer: Medicare Other | Admitting: Cardiology

## 2019-11-17 LAB — CBC
HCT: 31.2 % — ABNORMAL LOW (ref 36.0–46.0)
Hemoglobin: 9.4 g/dL — ABNORMAL LOW (ref 12.0–15.0)
MCH: 27.3 pg (ref 26.0–34.0)
MCHC: 30.1 g/dL (ref 30.0–36.0)
MCV: 90.7 fL (ref 80.0–100.0)
Platelets: 163 10*3/uL (ref 150–400)
RBC: 3.44 MIL/uL — ABNORMAL LOW (ref 3.87–5.11)
RDW: 17.6 % — ABNORMAL HIGH (ref 11.5–15.5)
WBC: 5.5 10*3/uL (ref 4.0–10.5)
nRBC: 0.4 % — ABNORMAL HIGH (ref 0.0–0.2)

## 2019-11-17 LAB — RENAL FUNCTION PANEL
Albumin: 3 g/dL — ABNORMAL LOW (ref 3.5–5.0)
Anion gap: 13 (ref 5–15)
BUN: 99 mg/dL — ABNORMAL HIGH (ref 6–20)
CO2: 27 mmol/L (ref 22–32)
Calcium: 8.7 mg/dL — ABNORMAL LOW (ref 8.9–10.3)
Chloride: 95 mmol/L — ABNORMAL LOW (ref 98–111)
Creatinine, Ser: 4.56 mg/dL — ABNORMAL HIGH (ref 0.44–1.00)
GFR calc Af Amer: 12 mL/min — ABNORMAL LOW (ref >60)
GFR calc non Af Amer: 10 mL/min — ABNORMAL LOW (ref >60)
Glucose, Bld: 143 mg/dL — ABNORMAL HIGH (ref 70–99)
Phosphorus: 5.6 mg/dL — ABNORMAL HIGH (ref 2.5–4.6)
Potassium: 3.6 mmol/L (ref 3.5–5.1)
Sodium: 135 mmol/L (ref 135–145)

## 2019-11-17 LAB — GLUCOSE, CAPILLARY
Glucose-Capillary: 131 mg/dL — ABNORMAL HIGH (ref 70–99)
Glucose-Capillary: 150 mg/dL — ABNORMAL HIGH (ref 70–99)
Glucose-Capillary: 142 mg/dL — ABNORMAL HIGH (ref 70–99)
Glucose-Capillary: 151 mg/dL — ABNORMAL HIGH (ref 70–99)

## 2019-11-17 NOTE — Progress Notes (Signed)
[facility] Progress Note     Assessment/ Plan:   1. AKI/CKD stage 3b- in setting of decompensated combined systolic and diastolic CHF and likely cardiorenal syndrome (low FeNa).  She had initially stabilized her Scr and reached a nadir of 3.63 on 11/14/19.  Unfortunately she has been more hypotensive with SBP's in the 90's over the past 48 hours and her BUN and Cr have started to climb with decreased UOP from her foley catheter.  Bladder scan was not successful due to significant anasarca of her soft tissue/panus.  Her lasix dose was increased to 120 mg IV (to start this afternoon). Now likely has ischemic ATN component to cardiorenal syndrome. 1. Difficult situation as she is not currently a candidate for outpatient dialysis given her super morbid obesity and bed-bound status.  She would  require placement in a longterm care facility that would provide HD in a bed as well as PT/OT for deconditioning.  She would be able to lose enough body weight (even with UF) to get below the 350 lb max. 2. Palliative care has been consulted and appreciate ongoing discussions with the family 3. Thankfully no urgent indication for dialysis at this time but numbers are still going the wrong direction. Looking at the weights she's still positive during this hospitalization and on exam is volume overloaded but what's happening intravascularly may be a different story.  4. Will have nursing flush foley as bladder scans are not accurate in this pt. If nothing substantial in bladder I would stop the Lasix as she's not really responding.  5. Likely will need to initiate discussions about goals of care. 2. Acute on chronic combined diastolic and systolic CHF- massively volume overloaded and not responding to present dose of IV lasix and agree with increasing to 120 mg bid and follow.  No respiratory distress at this time. 3. Acute respiratory failure with hypoxia- in settings decompensated CHF and super morbid  obesity/OSA OHS- currently on 3 liters via [place]. 4. Anemia of CKD- s/p IV feraheme x 2 (last dose given 11/15/19) and ESA. 5. Super morbid obesity- basically remains bed bound for months  Subjective:   Denies dyspnea/ n/v/CP.   Objective:   BP 106/63 (BP Location: Right Wrist)   Pulse 72   Temp 98.4 F (36.9 C) (Oral)   Resp 18   Ht 5\' 3"  (1.6 m)   Wt (!) 230.9 kg Comment: pt weighed WITHOUT SIZEWISE air machine on the foot of bed  LMP 11/09/2012   SpO2 97%   BMI 90.17 kg/m   Intake/Output Summary (Last 24 hours) at 11/17/2019 1217 Last data filed at 11/17/2019 1052 Gross per 24 hour  Intake 314.12 ml  Output 300 ml  Net 14.12 ml   Weight change: -0.454 kg  Physical Exam: Gen: morbidly obese woman lying in bed in NAD [id number] HS, no rub Resp: decreased BS  Abd: obese, 2+ pitting edema of lower abdomen/panus  Ext: 2+ brawny edema bilaterally/anasarca to mid abomen  Imaging: DG ESOPHAGUS W SINGLE CM (SOL OR THIN BA)  Result Date: 11/16/2019 CLINICAL DATA:  Chronic reflux. Abdominal distension. EXAM: ESOPHOGRAM/BARIUM SWALLOW TECHNIQUE: Single contrast examination was performed using  thin barium. FLUOROSCOPY TIME:  Fluoroscopy Time: 54 seconds of low-dose pulsed fluoroscopy Radiation Exposure Index (if provided by the fluoroscopic device): 26.2 mGy Number of Acquired Spot Images: 0 COMPARISON:  Chest CT 03/04/2011. Abdominal CT 11/08/2019. FINDINGS: Study is limited by the patient's body habitus and limited mobility. Examination was performed in the  supine and semi erect positions. The foot board would not support the patient's weight for erect imaging. The esophageal motility appears normal. No abnormality of the cervical esophagus identified. There is no evidence aspiration. Patient is status post cervical fusion. No focal abnormality of the thoracic esophagus identified. There is mild luminal narrowing at the gastroesophageal junction. A 13 mm barium tablet was administered and passed  rapidly into the distal esophagus. However, despite multiple swallows of water and thin barium, the tablet did not pass through the gastroesophageal junction with the patient in the semi erect position. No gross narrowing of the esophageal lumen at level of retention identified. IMPRESSION: 1. Limited examination. 2. Mild luminal narrowing at the gastroesophageal junction without mucosal irregularity. A 13 mm barium tablet did not pass through the distal esophagus with the patient in the semi erect position. This may be related to the patient's body habitus and limited mobility. Electronically Signed   By: Richardean Sale M.D.   On: 11/16/2019 11:46    Labs: BMET Recent Labs  Lab 11/10/19 1457 11/10/19 1942 11/11/19 3235 11/12/19 5732 11/13/19 0314 11/14/19 0905 11/15/19 0425 11/16/19 0538 11/17/19 0240  NA 135   < > 135 135 135 134* 133* 133* 135  K 6.6*   < > 4.4 4.0 3.5 3.6 3.5 3.5 3.6  CL 98   < > 97* 98 97* 95* 95* 94* 95*  CO2 21*   < > 26 27 27 26 27 26 27   GLUCOSE 184*   < > 140* 155* 140* 163* 128* 142* 143*  BUN 91*   < > 87* 87* 88* 90* 94* 98* 99*  CREATININE 4.07*   < > 3.88* 3.79* 3.64* 3.63* 3.99* 4.23* 4.56*  CALCIUM 8.9   < > 8.9 8.8* 8.6* 8.8* 8.4* 8.4* 8.7*  PHOS 5.8*  --   --  5.2*  --   --   --   --  5.6*   < > = values in this interval not displayed.   CBC Recent Labs  Lab 11/12/19 0521 11/13/19 0314 11/15/19 1225 11/17/19 0240  WBC 6.5 5.1 5.0 5.5  NEUTROABS  --   --  3.3  --   HGB 9.0* 8.9* 9.6* 9.4*  HCT 29.9* 30.0* 31.8* 31.2*  MCV 89.8 90.9 90.6 90.7  PLT 178 154 166 163    Medications:    . allopurinol  300 mg Oral Daily  . buprenorphine  1 patch Transdermal Q Sat  . Chlorhexidine Gluconate Cloth  6 each Topical Daily  . darbepoetin (ARANESP) injection - NON-DIALYSIS  40 mcg Subcutaneous Q Sun-1800  . heparin  5,000 Units Subcutaneous Q8H  . insulin aspart  0-6 Units Subcutaneous TID WC  . isosorbide-hydrALAZINE  1 tablet Oral BID  .  metoprolol succinate  25 mg Oral Daily      Otelia Santee, MD 11/17/2019, 12:17 PM

## 2019-11-17 NOTE — TOC Progression Note (Addendum)
Transition of Care [facility]) - Progression Note    Patient Details  Name: DISHA COTTAM MRN: [id number] Date of Birth: Nov 19, 1965  [facility] Care [facility]) CM/SW Contact  Zenon Mayo, RN Phone Number: 11/17/2019, 4:20 PM  Clinical Narrative:    Per Thedore Mins with [employer] he is working on her cpap and they will process it at the store and all she will have to do is go for a fitting and pick it up.  MD will need to put auto 4-20 on orders prior to dc.    Expected Discharge Plan: [place] Barriers to Discharge: Continued Medical Work up  Expected Discharge Plan and Services Expected Discharge Plan: [place] arrangements for the past 2 months: Single Family Home                 DME Arranged: Walker rolling with seat DME Agency: [employer] Date DME Agency Contacted: 11/15/19 Time DME Agency Contacted: [phone] Representative spoke with at DME Agency: [place]: PT, OT, RN [place] Agency: [facility] Date [place]: 11/15/19 Time [place]: [place] Representative spoke with at [place]: [place] ([place]) Interventions    Readmission Risk Interventions Readmission Risk Prevention Plan 09/13/2019 09/13/2019  Transportation Screening Complete Complete  PCP or Specialist Appt within 5-7 Days Complete Complete  Home Care Screening Complete Complete  Medication Review (RN CM) Referral to Pharmacy Complete  Some recent data might be hidden

## 2019-11-17 NOTE — Care Management Important Message (Signed)
Important Message  Patient Details  Name: [name] MRN: [id number] Date of Birth: Sep 09, 1966   Medicare Important Message Given:  Yes     Shelda Altes 11/17/2019, 9:56 AM

## 2019-11-17 NOTE — Progress Notes (Signed)
Occupational Therapy Treatment Patient Details Name: [name] MRN: [id number] DOB: 1966/05/28 [name] Date: 11/17/2019    History of present illness Pt is a 54 y/o female with PMH including but not limited to chronic systolic heart failure, diabetes mellitus, morbid obesity, chronic kidney disease was brought in for bilateral lower extremity edema and worsening back pain due to a fall from the stretcher 2 weeks ago.  On arrival to ED she was found to be in acute kidney injury and acute on chronic systolic heart failure.   OT comments  Performed UE exercises.  Pt tends to over-recruit traps and hold breath.  MD came in during session, reviewed [name] kidney function and asked her to consider code status. Pt upset after this conversation, but she did want to continue to work with OT.    Follow Up Recommendations  SNF    Equipment Recommendations  (has hospital bed)    Recommendations for Other Services      Precautions / Restrictions Precautions Precautions: Fall Restrictions Weight Bearing Restrictions: No       Mobility Bed Mobility               General bed mobility comments: unable to roll with +1 assist  Transfers                      Balance                                           ADL either performed or assessed with clinical judgement   ADL                                         General ADL Comments: Pt reports she is not having any difficulty with self feeding or grooming. She has also been coloring     Vision       Perception     Praxis      Cognition Arousal/Alertness: Awake/alert Behavior During Therapy: WFL for tasks assessed/performed                                   General Comments: pt recalled some of the exercises she was doing.  Needed multimodal cues for others        Exercises Exercises: Other exercises Other Exercises Other Exercises: performed AROM to bil UEs,  FF, abd, and horizontal abd x 10; c/o soreness in L shoulder Other Exercises: Used level 2 theraband for biceps Other Exercises: worked on shoulder retraction. Pt tends to over use traps during all shoulder exercises Other Exercises: educated on gravity eliminated rotation (to neutral).  Pt needs hand over hand assist to complete.  Educated to count during exercises to avoid holding breath   Shoulder Instructions       General Comments      Pertinent Vitals/ Pain       Faces Pain Scale: Hurts a little bit Pain Location: B LEs, back Pain Descriptors / Indicators: Sore [name]: [name];Monitored during session  Home Living  Prior Functioning/Environment              Frequency  Min 2X/week        Progress Toward Goals  OT Goals(current goals can now be found in the care plan section)  Progress towards OT goals: Progressing toward goals     Plan      Co-evaluation                 AM-PAC OT "6 Clicks" Daily Activity     Outcome Measure   Help from another person eating meals?: A Little Help from another person taking care of personal grooming?: A Little Help from another person toileting, which includes using toliet, bedpan, or urinal?: Total Help from another person bathing (including washing, rinsing, drying)?: A Lot Help from another person to put on and taking off regular upper body clothing?: A Lot Help from another person to put on and taking off regular lower body clothing?: Total 6 Click Score: 12    End of Session    OT Visit Diagnosis: Other abnormalities of gait and mobility (R26.89);Muscle weakness (generalized) (M62.81)   Activity Tolerance Patient tolerated treatment well   Patient Left in bed;with call bell/phone within reach;with bed alarm set;with nursing/sitter in room   Nurse Communication          Time: [phone] OT Time  Calculation (min): 32 min  Charges: OT General Charges $OT Visit: 1 Visit OT Treatments $Therapeutic Activity: 8-22 mins $Therapeutic Exercise: 8-22 mins  [name], [name] Acute Rehabilitation Services 11/17/2019   [name] 11/17/2019, 11:01 AM

## 2019-11-17 NOTE — Progress Notes (Addendum)
PROGRESS NOTE    [name]  HUT:[id number] DOB: [birth year] DOA: 11/07/2019 PCP: Vonna Drafts, FNP  Brief Narrative:  54 year old morbidly obese female chronic systolic heart failure, diabetes mellitus, morbid obesity, chronic kidney disease was brought in for bilateral lower extremity edema and worsening back pain due to a fall from the stretcher 2 weeks ago.  On arrival to ED she was found to be massively fluid overloaded with anasarca, acute kidney injury and acute on chronic systolic heart failure. Nephrology consulted, she was initially scheduled for HD but was cancelled.  She was treated with high-dose IV Lasix  -per renal not a longterm HD candidate -Palliative consulted & following, pt wishes to full code with full scope of Rx  Assessment & Plan:   Acute on chronic stage IV kidney disease with hyperkalemia,  Acute on chronic systolic and diastolic CHF, EF 65% Cardiorenal syndrome -Baseline creatinine around 2, on admission was 3.6 which peaked to 4 -Nephrology following, no evidence of hydronephrosis -Felt to be hemodynamically mediated  -initially there was plan for HD, subsequently this was held given transient improvement in urine output and poor long-term dialysis options -Continues to have significant volume overload with anasarca and poor response to diuretics -Urine output of 194 recorded yesterday and 120 this morning, bladder scan unable to determine amount given body habitus, will check another renal ultrasound  -Continue IV Lasix for now -Palliative care also consulted and following, patient wishes to continue full CODE STATUS and full scope of treatment, discussed the limitation of options with patient and relayed some of this to patient's daughter as well  AKI on CKD stage III Hyperkalemia -As above -Would be a poor long-term dialysis candidate given super morbid obesity, debility, gait disorder  Acute respiratory failure with hypoxia  Acute on chronic  systolic and diastolic CHF Super morbid obesity, OSA OHS -Diuretics as noted above, continue high-dose IV Lasix -Echo in December showed noted EF of 35% with global hypokinesis, RV and valves not well visualized, at least moderate pulmonary hypertension -Continue CPAP nightly and daytime naps -Wean O2 as tolerated  Acute  Metabolic Encephalopathy: Suspect from ESRD and uremia.  -Improved, but does have increased somnolence -Discontinued OxyContin  Super morbid obesity:  Body mass index is 90.17 kg/m.  Myoclonic jerks with some tremors  -Likely secondary to uremia.  Gabapentin on hold since admission -OxyContin discontinued  Chronic neck, back pain CT of the cervical spine was done on 2/23 and it shows 1. No acute osseous abnormality identified in the cervical spine. Solid C4-C7 anterior fusion. -CT of the lumbar spine on admission shows No acute osseous abnormality identified in the lumbar spine. Mid and lower lumbar disc degeneration and advanced facet arthrosis with chronic severe spinal stenosis at L3-4 and at least moderate spinal stenosis at L4-5. Severe left neural foraminal stenosis at L5-S1. Pain control and PT evaluation. PT evaluation recommending SNF.  -Discontinued OxyContin, low-dose Percocet as needed per home regimen hold for sedation  Diabetes mellitus  -Hemoglobin A1c is 8.8  -CBG stable, continue sliding scale insulin   Abnormal UA/possibly asymptomatic pyuria Completed 5 days of IV rocephin last week  Iron deficiency anemia  anemia of chronic disease from stage IV CKD IV iron given this admission, -continue epo  Odynophagia Pt reports severe GERD, and has a hard time swallowing , reports early satiety and some times have hard time when food passes down to the stomach:  Esophagogram noted only mild distal esophageal narrowing  DVT prophylaxis: Subcutaneous  heparin Code Status: Full code Family Communication: No family at bedside attempted to reach  daughter miracle Lewis without success and then was able to update daughter Alleen Borne   disposition Plan: Not stable for discharge is massively fluid overloaded with anasarca, poor response to diuretics, not a good dialysis candidate   Pressure injury present on admission Pressure Injury 11/12/17 Stage II -  Partial thickness loss of dermis presenting as a shallow open ulcer with a red, pink wound bed without slough. 2x1 cm,located in the lower L buttock (Active)  11/12/17 1045  Location: Buttocks  Location Orientation: Left  Staging: Stage II -  Partial thickness loss of dermis presenting as a shallow open ulcer with a red, pink wound bed without slough.  Wound Description (Comments): 2x1 cm,located in the lower L buttock  Present on Admission: Yes     Wound care consulted   Consultants:   Nephrology  Cardiology   palliative care  Procedures: CT of the abdomen without contrast CT of the cervical spine and lumbar spine without contrast  Antimicrobials: completed the course of Rocephin.   Subjective: -Remains in bed, no new complaints, complains of pain all over, dyspnea with minimal activity  Objective: Vitals:   11/16/19 2322 11/17/19 0539 11/17/19 0551 11/17/19 0835  BP:   108/62 106/63  Pulse: 75  72 72  Resp: 18  18 18   Temp:   97.7 F (36.5 C) 98.4 F (36.9 C)  TempSrc:   Oral Oral  SpO2: 93%  97% 97%  Weight:  (!) 230.9 kg    Height:        Intake/Output Summary (Last 24 hours) at 11/17/2019 1155 Last data filed at 11/17/2019 1052 Gross per 24 hour  Intake 314.12 ml  Output 300 ml  Net 14.12 ml   Filed Weights   11/15/19 0500 11/16/19 0645 11/17/19 0539  Weight: (!) 232.7 kg (!) 231.3 kg (!) 230.9 kg    Examination:  Gen: Super morbidly obese female, chronically ill-appearing, AAOx3, no distress HEENT: Neck obese unable to assess JVD Lungs: Distant breath sounds anteriorly CVS: S1-S2, regular rate rhythm Abd: soft, Non tender, non distended, BS  present Extremities: : Massive lower extremity edema extending to upper thigh, abdominal wall Skin: left buttock stage 2 pressure injury.  Psychiatry: Mood is appropriate.     Data Reviewed: I have personally reviewed following labs and imaging studies  CBC: Recent Labs  Lab 11/11/19 0523 11/12/19 0521 11/13/19 0314 11/15/19 1225 11/17/19 0240  WBC 5.4 6.5 5.1 5.0 5.5  NEUTROABS  --   --   --  3.3  --   HGB 9.3* 9.0* 8.9* 9.6* 9.4*  HCT 30.4* 29.9* 30.0* 31.8* 31.2*  MCV 89.9 89.8 90.9 90.6 90.7  PLT 179 178 154 166 834   Basic Metabolic Panel: Recent Labs  Lab 11/10/19 1457 11/10/19 1942 11/11/19 0523 11/11/19 0523 11/12/19 0521 11/12/19 0521 11/13/19 0314 11/14/19 0905 11/15/19 0425 11/16/19 0538 11/17/19 0240  NA 135   < > 135   < > 135   < > 135 134* 133* 133* 135  K 6.6*   < > 4.4   < > 4.0   < > 3.5 3.6 3.5 3.5 3.6  CL 98   < > 97*   < > 98   < > 97* 95* 95* 94* 95*  CO2 21*   < > 26   < > 27   < > 27 26 27 26 27   GLUCOSE 184*   < >  140*   < > 155*   < > 140* 163* 128* 142* 143*  BUN 91*   < > 87*   < > 87*   < > 88* 90* 94* 98* 99*  CREATININE 4.07*   < > 3.88*   < > 3.79*   < > 3.64* 3.63* 3.99* 4.23* 4.56*  CALCIUM 8.9   < > 8.9   < > 8.8*   < > 8.6* 8.8* 8.4* 8.4* 8.7*  MG  --   --  2.3  --  2.3  --  2.2  --   --   --   --   PHOS 5.8*  --   --   --  5.2*  --   --   --   --   --  5.6*   < > = values in this interval not displayed.   GFR: Estimated Creatinine Clearance: 28.2 mL/min (A) (by C-G formula based on SCr of 4.56 mg/dL (H)). Liver Function Tests: Recent Labs  Lab 11/10/19 1457 11/17/19 0240  ALBUMIN 3.4* 3.0*   No results for input(s): LIPASE, AMYLASE in the last 168 hours. No results for input(s): AMMONIA in the last 168 hours. Coagulation Profile: No results for input(s): INR, PROTIME in the last 168 hours. Cardiac Enzymes: No results for input(s): CKTOTAL, CKMB, CKMBINDEX, TROPONINI in the last 168 hours. BNP (last 3 results) No  results for input(s): PROBNP in the last 8760 hours. HbA1C: No results for input(s): HGBA1C in the last 72 hours. CBG: Recent Labs  Lab 11/16/19 1200 11/16/19 1627 11/16/19 2122 11/17/19 0602 11/17/19 1134  GLUCAP 128* 147* 163* 131* 150*   Lipid Profile: No results for input(s): CHOL, HDL, LDLCALC, TRIG, CHOLHDL, LDLDIRECT in the last 72 hours. Thyroid Function Tests: No results for input(s): TSH, T4TOTAL, FREET4, T3FREE, THYROIDAB in the last 72 hours. Anemia Panel: No results for input(s): VITAMINB12, FOLATE, FERRITIN, TIBC, IRON, RETICCTPCT in the last 72 hours. Sepsis Labs: No results for input(s): PROCALCITON, LATICACIDVEN in the last 168 hours.  Recent Results (from the past 240 hour(s))  SARS CORONAVIRUS 2 (TAT 6-24 HRS) Nasopharyngeal Nasopharyngeal Swab     Status: None   Collection Time: 11/07/19 11:10 PM   Specimen: Nasopharyngeal Swab  Result Value Ref Range Status   SARS Coronavirus 2 NEGATIVE NEGATIVE Final    Comment: (NOTE) SARS-CoV-2 target nucleic acids are NOT DETECTED. The SARS-CoV-2 RNA is generally detectable in upper and lower respiratory specimens during the acute phase of infection. Negative results do not preclude SARS-CoV-2 infection, do not rule out co-infections with other pathogens, and should not be used as the sole basis for treatment or other patient management decisions. Negative results must be combined with clinical observations, patient history, and epidemiological information. The expected result is Negative. Fact Sheet for Patients: SugarRoll.be Fact Sheet for Healthcare Providers: https://www.woods-mathews.com/ This test is not yet approved or cleared by the [place] FDA and  has been authorized for detection and/or diagnosis of SARS-CoV-2 by FDA under an Emergency Use Authorization (EUA). This EUA will remain  in effect (meaning this test can be used) for the duration of the COVID-19  declaration under Section 56 4(b)(1) of the Act, 21 U.S.C. section 360bbb-3(b)(1), unless the authorization is terminated or revoked sooner. Performed at [facility], [place] [address], [place], [place] [address]   Culture, Urine     Status: Abnormal   Collection Time: 11/08/19  7:36 AM   Specimen: Urine, Random  Result Value  Ref Range Status   Specimen Description URINE, RANDOM  Final   Special Requests   Final    NONE Performed at [facility], [address] [place], [place] [address]    Culture MULTIPLE SPECIES PRESENT, SUGGEST RECOLLECTION (A)  Final   Report Status 11/09/2019 FINAL  Final         Radiology Studies: DG ESOPHAGUS W SINGLE CM (SOL OR THIN BA)  Result Date: 11/16/2019 CLINICAL DATA:  Chronic reflux. Abdominal distension. EXAM: ESOPHOGRAM/BARIUM SWALLOW TECHNIQUE: Single contrast examination was performed using  thin barium. FLUOROSCOPY TIME:  Fluoroscopy Time: 54 seconds of low-dose pulsed fluoroscopy Radiation Exposure Index (if provided by the fluoroscopic device): 26.2 mGy Number of Acquired Spot Images: 0 COMPARISON:  Chest CT 03/04/2011. Abdominal CT 11/08/2019. FINDINGS: Study is limited by the patient's body habitus and limited mobility. Examination was performed in the supine and semi erect positions. The foot board would not support the patient's weight for erect imaging. The esophageal motility appears normal. No abnormality of the cervical esophagus identified. There is no evidence aspiration. Patient is status post cervical fusion. No focal abnormality of the thoracic esophagus identified. There is mild luminal narrowing at the gastroesophageal junction. A 13 mm barium tablet was administered and passed rapidly into the distal esophagus. However, despite multiple swallows of water and thin barium, the tablet did not pass through the gastroesophageal junction with the patient in the semi erect position. No gross narrowing of the esophageal lumen at  level of retention identified. IMPRESSION: 1. Limited examination. 2. Mild luminal narrowing at the gastroesophageal junction without mucosal irregularity. A 13 mm barium tablet did not pass through the distal esophagus with the patient in the semi erect position. This may be related to the patient's body habitus and limited mobility. Electronically Signed   By: Richardean Sale M.D.   On: 11/16/2019 11:46        Scheduled Meds: . allopurinol  300 mg Oral Daily  . buprenorphine  1 patch Transdermal Q Sat  . Chlorhexidine Gluconate Cloth  6 each Topical Daily  . darbepoetin (ARANESP) injection - NON-DIALYSIS  40 mcg Subcutaneous Q Sun-1800  . heparin  5,000 Units Subcutaneous Q8H  . insulin aspart  0-6 Units Subcutaneous TID WC  . isosorbide-hydrALAZINE  1 tablet Oral BID  . metoprolol succinate  25 mg Oral Daily   Continuous Infusions: . sodium chloride Stopped (11/17/19 0318)  . furosemide 62 mL/hr at 11/17/19 0400     LOS: 10 days    Domenic Polite, MD [facility]  11/17/2019, 11:55 AM

## 2019-11-18 LAB — RENAL FUNCTION PANEL
Albumin: 2.9 g/dL — ABNORMAL LOW (ref 3.5–5.0)
Anion gap: 12 (ref 5–15)
BUN: 104 mg/dL — ABNORMAL HIGH (ref 6–20)
CO2: 26 mmol/L (ref 22–32)
Calcium: 8.5 mg/dL — ABNORMAL LOW (ref 8.9–10.3)
Chloride: 95 mmol/L — ABNORMAL LOW (ref 98–111)
Creatinine, Ser: 4.81 mg/dL — ABNORMAL HIGH (ref 0.44–1.00)
GFR calc Af Amer: 11 mL/min — ABNORMAL LOW (ref >60)
GFR calc non Af Amer: 10 mL/min — ABNORMAL LOW (ref >60)
Glucose, Bld: 122 mg/dL — ABNORMAL HIGH (ref 70–99)
Phosphorus: 5.7 mg/dL — ABNORMAL HIGH (ref 2.5–4.6)
Potassium: 3.6 mmol/L (ref 3.5–5.1)
Sodium: 133 mmol/L — ABNORMAL LOW (ref 135–145)

## 2019-11-18 LAB — CBC
HCT: 29.8 % — ABNORMAL LOW (ref 36.0–46.0)
Hemoglobin: 9 g/dL — ABNORMAL LOW (ref 12.0–15.0)
MCH: 27.6 pg (ref 26.0–34.0)
MCHC: 30.2 g/dL (ref 30.0–36.0)
MCV: 91.4 fL (ref 80.0–100.0)
Platelets: 160 10*3/uL (ref 150–400)
RBC: 3.26 MIL/uL — ABNORMAL LOW (ref 3.87–5.11)
RDW: 18.1 % — ABNORMAL HIGH (ref 11.5–15.5)
WBC: 5.3 10*3/uL (ref 4.0–10.5)
nRBC: 0.4 % — ABNORMAL HIGH (ref 0.0–0.2)

## 2019-11-18 LAB — GLUCOSE, CAPILLARY
Glucose-Capillary: 123 mg/dL — ABNORMAL HIGH (ref 70–99)
Glucose-Capillary: 153 mg/dL — ABNORMAL HIGH (ref 70–99)
Glucose-Capillary: 154 mg/dL — ABNORMAL HIGH (ref 70–99)
Glucose-Capillary: 140 mg/dL — ABNORMAL HIGH (ref 70–99)

## 2019-11-18 MED ORDER — OXYCODONE-ACETAMINOPHEN 5-325 MG PO TABS: 1.0000 | ORAL_TABLET | Freq: Three times a day (TID) | ORAL | Status: DC | PRN

## 2019-11-18 MED ORDER — ALPRAZOLAM 0.25 MG PO TABS: 0.2500 mg | ORAL_TABLET | Freq: Every evening | ORAL | Status: DC | PRN

## 2019-11-18 NOTE — Progress Notes (Signed)
PROGRESS NOTE    [name]  SFK:[id number] DOB: October 27, 1965 DOA: 11/07/2019 PCP: Vonna Drafts, FNP  Brief Narrative: 54 year old morbidly obese female chronic systolic heart failure, diabetes mellitus, morbid obesity, chronic kidney disease was brought in for bilateral lower extremity edema and worsening back pain due to a fall from the stretcher 2 weeks ago.  On arrival to ED she was found to be massively fluid overloaded with anasarca, acute kidney injury and acute on chronic systolic heart failure. Nephrology consulted, she was initially scheduled for HD but was cancelled.  She was treated with high-dose IV Lasix  -per renal not a longterm HD candidate -Palliative consulted & following, pt wishes to full code with full scope of Rx -Kidney function continues to decline, urine output is poor as well  Assessment & Plan:   Acute on chronic stage IV kidney disease with hyperkalemia,  Acute on chronic systolic and diastolic CHF, EF 17% Cardiorenal syndrome -Baseline creatinine around 2, on admission was 3.6  -Nephrology following, no evidence of hydronephrosis -Felt to be hemodynamically mediated  -Continues to have significant volume overload with anasarca, remains on high-dose IV Lasix with poor response to diuretics -Creatinine worsening up to 4.8 and BUN now 104, no response to diuretics -Urine output of 325 recorded yesterday , renal ultrasound notes decompressed bladder with catheter -Palliative care also consulted and following, patient wishes to continue full CODE STATUS and full scope of treatment -Called and updated daughter Alleen Borne 3/4 and 3/5, relay that patient is not a long-term dialysis candidate per nephrology and is at high risk of quick decompensation. -Palliative requested to reengage with patient and family  AKI on CKD stage III  Hyperkalemia -As above -Would be a poor long-term dialysis candidate given super morbid obesity, debility, gait disorder   Acute respiratory failure with hypoxia  Acute on chronic systolic and diastolic CHF Super morbid obesity, OSA OHS -Diuretics as noted above, continue high-dose IV Lasix -Echo in December showed noted EF of 35% with global hypokinesis, RV and valves not well visualized, at least moderate pulmonary hypertension -Continue CPAP nightly and daytime naps -Wean O2 as tolerated -Ensure compliance with CPAP, discontinue all narcotics  Acute  Metabolic Encephalopathy: Suspect from ESRD and uremia.  -Improved, but does have increased somnolence -Discontinued OxyContin  Super morbid obesity:  -BMI of 90.1  Myoclonic jerks with some tremors  -Likely secondary to uremia.  Gabapentin on hold since admission -OxyContin discontinued  Chronic neck, back pain Chronic narcotic dependence CT of the cervical spine was done on 2/23 and it shows 1. No acute osseous abnormality identified in the cervical spine. Solid C4-C7 anterior fusion. -CT of the lumbar spine on admission shows No acute osseous abnormality identified in the lumbar spine. Mid and lower lumbar disc degeneration and advanced facet arthrosis with chronic severe spinal stenosis at L3-4 and at least moderate spinal stenosis at L4-5. Severe left neural foraminal stenosis at L5-S1. Pain control and PT evaluation. PT evaluation recommending SNF.  -Discontinued OxyContin,  -decreased Percocet dose, will discontinue this now especially with worsening uremia -Discontinue buprenorphine patch  Diabetes mellitus  -Hemoglobin A1c is 8.8  -CBG stable, continue sliding scale insulin   Abnormal UA/possibly asymptomatic pyuria Completed 5 days of IV rocephin last week  Iron deficiency anemia  anemia of chronic disease from stage IV CKD IV iron given this admission, -continue epo  Odynophagia Pt reports severe GERD, and has a hard time swallowing , reports early satiety and some times have hard  time when food passes down to the stomach:   Esophagogram noted only mild distal esophageal narrowing  DVT prophylaxis: Subcutaneous heparin Code Status: Full code Family Communication: No family at bedside attempted to reach daughter miracle Lewis without success and then was able to update daughter Alleen Borne 3/4 and again today 3/5   disposition Plan: Not stable for discharge is massively fluid overloaded with anasarca, poor response to diuretics, not a good dialysis candidate   Pressure injury present on admission Pressure Injury 11/12/17 Stage II -  Partial thickness loss of dermis presenting as a shallow open ulcer with a red, pink wound bed without slough. 2x1 cm,located in the lower L buttock (Active)  11/12/17 1045  Location: Buttocks  Location Orientation: Left  Staging: Stage II -  Partial thickness loss of dermis presenting as a shallow open ulcer with a red, pink wound bed without slough.  Wound Description (Comments): 2x1 cm,located in the lower L buttock  Present on Admission: Yes     Wound care consulted   Consultants:   Nephrology  Cardiology   palliative care  Procedures: CT of the abdomen without contrast CT of the cervical spine and lumbar spine without contrast  Antimicrobials: completed the course of Rocephin.   Subjective: -No new complaints, remains profoundly fluid overloaded with minimal urine output and poor response to diuretics -Complains of pain all all over,  Objective: Vitals:   11/17/19 0835 11/17/19 2113 11/18/19 0437 11/18/19 0917  BP: 106/63 (!) 117/57 114/73 108/60  Pulse: 72 71 76 88  Resp: 18 20 20    Temp: 98.4 F (36.9 C) 97.7 F (36.5 C) 98.3 F (36.8 C)   TempSrc: Oral Oral Oral   SpO2: 97% 95% 98% 97%  Weight:   (!) 231.6 kg   Height:        Intake/Output Summary (Last 24 hours) at 11/18/2019 1117 Last data filed at 11/18/2019 0920 Gross per 24 hour  Intake 510 ml  Output 225 ml  Net 285 ml   Filed Weights   11/16/19 0645 11/17/19 0539 11/18/19 0437  Weight:  (!) 231.3 kg (!) 230.9 kg (!) 231.6 kg    Examination:  Gen: Chronically ill super morbidly obese female appears older than stated age, AAO x2, no distress HEENT: Neck obese unable to assess JVD Lungs: Good air movement bilaterally, CTAB CVS: RRR,No Gallops,Rubs or new Murmurs Abd: soft, Non tender, non distended, BS present Extremities: Massive lower extremity edema extending to upper thigh, abdominal wall  skin: left buttock stage 2 pressure injury.  Psychiatry: Mood is appropriate.     Data Reviewed: I have personally reviewed following labs and imaging studies  CBC: Recent Labs  Lab 11/12/19 0521 11/13/19 0314 11/15/19 1225 11/17/19 0240 11/18/19 0322  WBC 6.5 5.1 5.0 5.5 5.3  NEUTROABS  --   --  3.3  --   --   HGB 9.0* 8.9* 9.6* 9.4* 9.0*  HCT 29.9* 30.0* 31.8* 31.2* 29.8*  MCV 89.8 90.9 90.6 90.7 91.4  PLT 178 154 166 163 161   Basic Metabolic Panel: Recent Labs  Lab 11/12/19 0521 11/12/19 0521 11/13/19 0314 11/13/19 0314 11/14/19 0905 11/15/19 0425 11/16/19 0538 11/17/19 0240 11/18/19 0322  NA 135   < > 135   < > 134* 133* 133* 135 133*  K 4.0   < > 3.5   < > 3.6 3.5 3.5 3.6 3.6  CL 98   < > 97*   < > 95* 95* 94* 95* 95*  CO2 27   < > 27   < > 26 27 26 27 26   GLUCOSE 155*   < > 140*   < > 163* 128* 142* 143* 122*  BUN 87*   < > 88*   < > 90* 94* 98* 99* 104*  CREATININE 3.79*   < > 3.64*   < > 3.63* 3.99* 4.23* 4.56* 4.81*  CALCIUM 8.8*   < > 8.6*   < > 8.8* 8.4* 8.4* 8.7* 8.5*  MG 2.3  --  2.2  --   --   --   --   --   --   PHOS 5.2*  --   --   --   --   --   --  5.6* 5.7*   < > = values in this interval not displayed.   GFR: Estimated Creatinine Clearance: 26.8 mL/min (A) (by C-G formula based on SCr of 4.81 mg/dL (H)). Liver Function Tests: Recent Labs  Lab 11/17/19 0240 11/18/19 0322  ALBUMIN 3.0* 2.9*   No results for input(s): LIPASE, AMYLASE in the last 168 hours. No results for input(s): AMMONIA in the last 168 hours. Coagulation  Profile: No results for input(s): INR, PROTIME in the last 168 hours. Cardiac Enzymes: No results for input(s): CKTOTAL, CKMB, CKMBINDEX, TROPONINI in the last 168 hours. BNP (last 3 results) No results for input(s): PROBNP in the last 8760 hours. HbA1C: No results for input(s): HGBA1C in the last 72 hours. CBG: Recent Labs  Lab 11/17/19 0602 11/17/19 1134 11/17/19 1623 11/17/19 2145 11/18/19 0639  GLUCAP 131* 150* 142* 151* 123*   Lipid Profile: No results for input(s): CHOL, HDL, LDLCALC, TRIG, CHOLHDL, LDLDIRECT in the last 72 hours. Thyroid Function Tests: No results for input(s): TSH, T4TOTAL, FREET4, T3FREE, THYROIDAB in the last 72 hours. Anemia Panel: No results for input(s): VITAMINB12, FOLATE, FERRITIN, TIBC, IRON, RETICCTPCT in the last 72 hours. Sepsis Labs: No results for input(s): PROCALCITON, LATICACIDVEN in the last 168 hours.  No results found for this or any previous visit (from the past 240 hour(s)).       Radiology Studies: US RENAL  Result Date: 11/17/2019 CLINICAL DATA:  Acute kidney injury EXAM: RENAL / URINARY TRACT ULTRASOUND COMPLETE COMPARISON:  CT 11/08/2019, renal ultrasound 11/08/2019 FINDINGS: Right Kidney: Renal measurements: 10.2 x 5.6 x 4.1 cm = volume: 124 mL. Increased cortical echogenicity. No mass or hydronephrosis visualized. Left Kidney: Nonvisualization of the left kidney secondary to poor sonographic window and patient habitus. Bladder: Decompressed by Foley catheter.  Poorly assessed sonographically. Other: None. IMPRESSION: Technically difficult exam given patient body habitus and poor sonographic windows. Nonvisualization of the left kidney. Increased cortical echogenicity in the right kidney. Compatible with medical renal disease. Bladder decompressed by Foley catheter. Electronically Signed   By: Lovena Le M.D.   On: 11/17/2019 18:05   DG ESOPHAGUS W SINGLE CM (SOL OR THIN BA)  Result Date: 11/16/2019 CLINICAL DATA:  Chronic  reflux. Abdominal distension. EXAM: ESOPHOGRAM/BARIUM SWALLOW TECHNIQUE: Single contrast examination was performed using  thin barium. FLUOROSCOPY TIME:  Fluoroscopy Time: 54 seconds of low-dose pulsed fluoroscopy Radiation Exposure Index (if provided by the fluoroscopic device): 26.2 mGy Number of Acquired Spot Images: 0 COMPARISON:  Chest CT 03/04/2011. Abdominal CT 11/08/2019. FINDINGS: Study is limited by the patient's body habitus and limited mobility. Examination was performed in the supine and semi erect positions. The foot board would not support the patient's weight for erect imaging. The esophageal motility appears normal. No  abnormality of the cervical esophagus identified. There is no evidence aspiration. Patient is status post cervical fusion. No focal abnormality of the thoracic esophagus identified. There is mild luminal narrowing at the gastroesophageal junction. A 13 mm barium tablet was administered and passed rapidly into the distal esophagus. However, despite multiple swallows of water and thin barium, the tablet did not pass through the gastroesophageal junction with the patient in the semi erect position. No gross narrowing of the esophageal lumen at level of retention identified. IMPRESSION: 1. Limited examination. 2. Mild luminal narrowing at the gastroesophageal junction without mucosal irregularity. A 13 mm barium tablet did not pass through the distal esophagus with the patient in the semi erect position. This may be related to the patient's body habitus and limited mobility. Electronically Signed   By: Richardean Sale M.D.   On: 11/16/2019 11:46        Scheduled Meds: . allopurinol  300 mg Oral Daily  . buprenorphine  1 patch Transdermal Q Sat  . Chlorhexidine Gluconate Cloth  6 each Topical Daily  . darbepoetin (ARANESP) injection - NON-DIALYSIS  40 mcg Subcutaneous Q Sun-1800  . heparin  5,000 Units Subcutaneous Q8H  . insulin aspart  0-6 Units Subcutaneous TID WC  .  isosorbide-hydrALAZINE  1 tablet Oral BID  . metoprolol succinate  25 mg Oral Daily   Continuous Infusions: . sodium chloride Stopped (11/17/19 0318)  . furosemide 120 mg (11/18/19 0202)     LOS: 11 days    Domenic Polite, MD [facility]  11/18/2019, 11:17 AM

## 2019-11-18 NOTE — Progress Notes (Signed)
PT Cancellation Note  Patient Details Name: [name] MRN: [id number] DOB: 06/13/1966   Cancelled Treatment:    Reason Eval/Treat Not Completed: Patient declined, no reason specified Pt declined participating in therapy at this time and wants to focus on getting palliative meeting arranged with daughters. PT will continue to follow acutely.   Earney Navy, PTA Acute Rehabilitation Services Pager: [phone] Office: [phone]   11/18/2019, 1:25 PM

## 2019-11-18 NOTE — Progress Notes (Signed)
Palliative Medicine Inpatient Follow Up Note   HPI: [name] a 54 y.o.femalewithhistory of chronic systolic heart failure, diabetes mellitus, morbid obesity, chronic kidney disease who was admitted January last month for CHF exacerbation subsequent which patient came back to the ER the following week for accidental overdose of oxycodone presents to the ER after patient has been having increasing neck pain and low back pain after having a fall about 2 weeks ago. Denies any weakness of the extremities or any incontinence of urine or bowel. Patient has noted increasing tremors of the extremities. Patient also noted increasing bilateral lower extremity edema with no definite shortness of breath or chest pain.  2/26 - Palliative care was asked to be involved to help with goals of care conversations in the setting of reduced renal function. Patient thought to be a poor OP HD candidate given her morbid obesity.   2/28 - Patient was due to go home early in week, had slight improvement in kidney function.   3/5 - Patients kidney function worsening. Cardiorenal picture. Not a good dialysis candidate.   Today's Discussion (11/18/2019): Chart reviewed. Patient is not doing well today, she appears more mentally altered incrementally noted to be drifting off. I asked her if she understood what the kidney doctor had told her earlier in the day. She said that she would not be offered dialysis and she did not understand why.   I requested that Kimble's daughters Terrence Dupont and Seth Bake come in for a formal goals of care meeting with myself, Dr. Augustin Coupe, and Dr. Broadus John.   We met at bedside this afternoon. Seth Bake had asked why her mother would not get dialysis when her grandmother who was larger than her mother had received it. Dr. Augustin Coupe explained that [name] has a poorly functioning heart which is contributing to her kidney failure. Being that her heart will continue to function poorly it is not  advisable for ongoing support with hemodialysis. Both he and his other four colleagues all feel that [name] would do poorly long term with hemodialysis.  Seth Bake stated that she would like her mother transferred to [facility] and had spoken to a physician who would accept her. When Dr. Broadus John asked who this was her daughter was unable to answer. The medical team offered to transfer if an accepting physician had been found.   Dr. Durenda Hurt strongly recommended DNR which I am in complete agreement with.    [name] and her children said that they need to think about things.  I recommended that she consider going home with hospice. We discussed that she likely has limited time left and it is of importance how she decides to spend this time. I brought up the delay of the inevitable in conversation.  From a psychosocial perspective it does not seem that her daughters are ready to hear that she has limited time left. Lorana states that she wants her to live as long as she can for her daughters. Presently the family appear to be in denial of the present situation.   In the early evening I followed up with [name] and her daughters. They remain hopeful to find a hospital that she can transfer to and to solidify a dialysis chair for her.   Discussed with patient the importance of continued conversation with family and their  medical providers regarding overall plan of care and treatment options, ensuring decisions are within the context of the patients values and GOCs.  Questions and concerns addressed   We plan  to follow up tomorrow.  Vital Signs Vitals:   11/18/19 0917 11/18/19 1126  BP: 108/60 (!) 104/51  Pulse: 88 75  Resp:  20  Temp:  98.3 F (36.8 C)  SpO2: 97% 92%    Intake/Output Summary (Last 24 hours) at 11/18/2019 1618 Last data filed at 11/18/2019 1602 Gross per 24 hour  Intake 600 ml  Output 125 ml  Net 475 ml   Last Weight  Most recent update: 11/18/2019  7:21 AM   Weight  231.6  kg (510 lb 8 oz)            Physical Exam Vitals and nursing note reviewed.  HENT:     Head: Normocephalic.     Nose: Nose normal.     Mouth/Throat:     Mouth: Mucous membranes are dry.  Eyes:     Pupils: Pupils are equal, round, and reactive to light.  Cardiovascular:     Rate and Rhythm: Normal rate and regular rhythm.     Pulses: Normal pulses.  Pulmonary:     Comments: 3LPM [place], diffuse crackles, labored Abdominal:     Palpations: Abdomen is soft.  Musculoskeletal:     Cervical back: Normal range of motion.  Skin:    General: Skin is dry.     Capillary Refill: Capillary refill takes less than 2 seconds.  Neurological:     Mental Status: She is alert and oriented to person, place, and time, though intermittently lethargic  SUMMARY OF RECOMMENDATIONS   Patient remains Full code  Advanced Directives complete, will place on chart  Patients daughters are hopeful for transfer to [facility]   Ongoing Peak discussions  Time Spent: 65 Greater than 50% of the time was spent in counseling and coordination of care ______________________________________________________________________________________ [place] Team Team Cell Phone: [phone] Please utilize secure chat with additional questions, if there is no response within 30 minutes please call the above phone number  Palliative Medicine Team providers are available by phone from 7am to 7pm daily and can be reached through the team cell phone.  Should this patient require assistance outside of these hours, please call the patient's attending physician.

## 2019-11-18 NOTE — Progress Notes (Signed)
[name] Progress Note     Assessment/ Plan:   1. AKI/CKD stage 3b- in setting of decompensated combined systolic and diastolic CHF and likely cardiorenal syndrome (low FeNa). She had initially stabilized her Scr and reached a nadir of 3.63 on 11/14/19. Unfortunately she has been more hypotensive with SBP's in the 90's over the past 48 hours and her BUN and Cr have started to climb with decreased UOP from her foley catheter. Bladder scan was not successful due to significant anasarca of her soft tissue/panus. Her lasix dose was increased to 120 mg IV (to start this afternoon). Now likely has ischemic ATN component to cardiorenal syndrome. 1. Difficult situation as she is not currently a candidate for outpatient dialysis given her super morbid obesity and bed-bound status.  She would  require placement in a longterm care facility that would provide HD in a bed as well as PT/OT for deconditioning. She would need  to lose enough body weight (even with UF) to get below the 350 lb max -> she doesn't have 150 lbs of fluid onboard. 2. Palliative care has been consulted and appreciate ongoing discussions with the family -> need to speak with pt and daughters again as she is moving toward renal replacement therapy (RRT). 3. I would recommend not initiating RRT bec likely she would become dialysis dependent and she is not a candidate for long term dialysis. She was grasping at straws and need to engage again with  discussions about goals of care. She does not even want to be DNR. If there is no progress towards comfort care and DNR I will offer RRT but only for 7-10 days. She is really grasping at straws but agreeable to stopping the RRT after 7-10 days with no further RRT to be offered. Again, I prefer not to do RRT but if that is the only way to finalize plans then I am willing to do so. 2. Acute on chronic combined diastolic and systolic CHF- massively volume overloaded and not responding  to present dose of IV lasix  - I would stop the Lasix for 24-48hrs as she's not responding  3. Acute respiratory failure with hypoxia- in settings decompensated CHF and super morbid obesity/OSA OHS- currently on 3 liters via [place]. 4. Anemia of CKD- s/p IV feraheme x 2 (last dose given 11/15/19) and ESA. 5. Super morbid obesity- basically remains bed bound for months  Subjective:   Denies dyspnea/ n/v/CP.   Objective:   BP (!) 104/51 (BP Location: Right Arm)   Pulse 75   Temp 98.3 F (36.8 C) (Oral)   Resp 20   Ht 5\' 3"  (1.6 m)   Wt (!) 231.6 kg   LMP 11/09/2012   SpO2 92%   BMI 90.43 kg/m   Intake/Output Summary (Last 24 hours) at 11/18/2019 1323 Last data filed at 11/18/2019 0920 Gross per 24 hour  Intake 510 ml  Output 225 ml  Net 285 ml   Weight change: 0.68 kg  Physical Exam: UMP:NTIRWERX obese woman lying in bed in NAD, more dyspneic today VQM:GQQPY HS, no rub Resp:decreased BS PPJ:KDTOI, 2+ pitting edema of lower abdomen/panus Ext:2+ brawny edema bilaterally/anasarca to mid abomen  Imaging: US RENAL  Result Date: 11/17/2019 CLINICAL DATA:  Acute kidney injury EXAM: RENAL / URINARY TRACT ULTRASOUND COMPLETE COMPARISON:  CT 11/08/2019, renal ultrasound 11/08/2019 FINDINGS: Right Kidney: Renal measurements: 10.2 x 5.6 x 4.1 cm = volume: 124 mL. Increased cortical echogenicity. No mass or hydronephrosis visualized. Left Kidney: Nonvisualization  of the left kidney secondary to poor sonographic window and patient habitus. Bladder: Decompressed by Foley catheter.  Poorly assessed sonographically. Other: None. IMPRESSION: Technically difficult exam given patient body habitus and poor sonographic windows. Nonvisualization of the left kidney. Increased cortical echogenicity in the right kidney. Compatible with medical renal disease. Bladder decompressed by Foley catheter. Electronically Signed   By: Lovena Le M.D.   On: 11/17/2019 18:05    Labs: BMET Recent Labs  Lab  11/12/19 0521 11/13/19 0314 11/14/19 0905 11/15/19 0425 11/16/19 0538 11/17/19 0240 11/18/19 0322  NA 135 135 134* 133* 133* 135 133*  K 4.0 3.5 3.6 3.5 3.5 3.6 3.6  CL 98 97* 95* 95* 94* 95* 95*  CO2 27 27 26 27 26 27 26   GLUCOSE 155* 140* 163* 128* 142* 143* 122*  BUN 87* 88* 90* 94* 98* 99* 104*  CREATININE 3.79* 3.64* 3.63* 3.99* 4.23* 4.56* 4.81*  CALCIUM 8.8* 8.6* 8.8* 8.4* 8.4* 8.7* 8.5*  PHOS 5.2*  --   --   --   --  5.6* 5.7*   CBC Recent Labs  Lab 11/13/19 0314 11/15/19 1225 11/17/19 0240 11/18/19 0322  WBC 5.1 5.0 5.5 5.3  NEUTROABS  --  3.3  --   --   HGB 8.9* 9.6* 9.4* 9.0*  HCT 30.0* 31.8* 31.2* 29.8*  MCV 90.9 90.6 90.7 91.4  PLT 154 166 163 160    Medications:    . allopurinol  300 mg Oral Daily  . Chlorhexidine Gluconate Cloth  6 each Topical Daily  . darbepoetin (ARANESP) injection - NON-DIALYSIS  40 mcg Subcutaneous Q Sun-1800  . heparin  5,000 Units Subcutaneous Q8H  . insulin aspart  0-6 Units Subcutaneous TID WC  . isosorbide-hydrALAZINE  1 tablet Oral BID  . metoprolol succinate  25 mg Oral Daily      Otelia Santee, MD 11/18/2019, 1:23 PM

## 2019-11-19 LAB — GLUCOSE, CAPILLARY
Glucose-Capillary: 127 mg/dL — ABNORMAL HIGH (ref 70–99)
Glucose-Capillary: 128 mg/dL — ABNORMAL HIGH (ref 70–99)
Glucose-Capillary: 132 mg/dL — ABNORMAL HIGH (ref 70–99)
Glucose-Capillary: 118 mg/dL — ABNORMAL HIGH (ref 70–99)

## 2019-11-19 LAB — RENAL FUNCTION PANEL
Albumin: 3 g/dL — ABNORMAL LOW (ref 3.5–5.0)
Anion gap: 13 (ref 5–15)
BUN: 109 mg/dL — ABNORMAL HIGH (ref 6–20)
CO2: 27 mmol/L (ref 22–32)
Calcium: 8.6 mg/dL — ABNORMAL LOW (ref 8.9–10.3)
Chloride: 93 mmol/L — ABNORMAL LOW (ref 98–111)
Creatinine, Ser: 4.99 mg/dL — ABNORMAL HIGH (ref 0.44–1.00)
GFR calc Af Amer: 11 mL/min — ABNORMAL LOW (ref >60)
GFR calc non Af Amer: 9 mL/min — ABNORMAL LOW (ref >60)
Glucose, Bld: 117 mg/dL — ABNORMAL HIGH (ref 70–99)
Phosphorus: 6 mg/dL — ABNORMAL HIGH (ref 2.5–4.6)
Potassium: 3.8 mmol/L (ref 3.5–5.1)
Sodium: 133 mmol/L — ABNORMAL LOW (ref 135–145)

## 2019-11-19 MED ORDER — SENNOSIDES-DOCUSATE SODIUM 8.6-50 MG PO TABS: 2.0000 | ORAL_TABLET | Freq: Every evening | ORAL | 0 refills | Status: AC | PRN

## 2019-11-19 MED ORDER — BIDIL 20-37.5 MG PO TABS: 1.0000 | ORAL_TABLET | Freq: Two times a day (BID) | ORAL | Status: AC

## 2019-11-19 NOTE — Progress Notes (Signed)
Responding to Spiritual Consult found patient very sleepy and difficult to engage.  Eventually she began a mantra saying, "More time, more time, more time."  When Chaplain tried to engage patient in conversation around "more time" all patient was able to say was "More time, that's all, more time." Soon she said she had the towel across her chest because her lunch was here.  I offered to open her lunch and she agreed.  Chaplain asked if Mrs. Sillas needed some help eating. She did, so Chaplain offered items on tray and fed Mrs. Kussman.  She ate some; not much.  Appeared to very sleepy most of the time the Chaplain present. Couldn't tell if she was sleepy or unwilling to engage at this time.  When she indicated she didn't want any more food, I departed, after wishing her well and telling her I Ioved her.  She thanked me.  Chaplain will be happy to visit again if Mrs. Ohlrich indicates she would like another visit.  De Burrs Chaplain Resident

## 2019-11-19 NOTE — Progress Notes (Signed)
Received consult for home hospice; CM talked to patient at the bedside to offer choices; pt chose [facility]; referral made as requested. Patient also has a personal care service with nurses aides and she does not want to lose this service with home hospice- CM will explore this more with [facility]. [place] Supervisor [phone]

## 2019-11-19 NOTE — Progress Notes (Signed)
[facility] Progress Note     Assessment/ Plan:   1. AKI/CKD stage 3b- in setting of decompensated combined systolic and diastolic CHF and likely cardiorenal syndrome (low FeNa). She had initially stabilized her Scr and reached a nadir of 3.63 on 11/14/19. Unfortunately she has been more hypotensive with SBP's in the 90's over the past 48 hours and her BUN and Cr have started to climb with decreased UOP from her foley catheter. Bladder scan was not successful due to significant anasarca of her soft tissue/panus. Her lasix dose was increased to 120 mg IV but unfortunately stopped responding. Now likely has ischemic ATN component to cardiorenal syndrome. 1. Difficult situation as she is not currently a candidate for outpatient dialysis given her super morbid obesity and bed-bound status.She would require placement in a longterm care facility that would provide HD in a bed as well as PT/OT for deconditioning. She would need  to lose enough body weight (even with UF) to get below the 350 lb max -> she doesn't have 150 lbs of fluid onboard. 2. Palliative care has been consulted and appreciate ongoing discussions with the family -> need to speak with pt and daughters again as she is moving toward renal replacement therapy (RRT). 3. I would recommend not initiating RRT bec likely she would become dialysis dependent and she is not a candidate for long term dialysis. Appreciate palliative help with goals of care; she believes there's a unit in [place] or [place] which can accommodate her. 4. If there is no progress towards comfort care and DNR I will offer RRT but only for 7-10 days. She is really grasping at straws but agreeable to stopping the RRT after 7-10 days with no further RRT to be offered. Again, I prefer not to do RRT but if that is the only way to finalize plans then I am willing to do so. 2. Acute on chronic combined diastolic and systolic CHF NICM w/ EF 75% - massively  volume overloaded and not responding to present dose of IV lasix - repeat echo; also wonder if advanced heart care consult would be beneficial.    3. Acute respiratory failure with hypoxia- in settings decompensated CHF and super morbid obesity/OSA OHS- currently on 3 liters via [place]. 4. Anemia of CKD- s/p IV feraheme x 2 (last dose given 11/15/19) and ESA. 5. Super morbid obesity- basically remains bed bound for months  Subjective:   Denies dyspnea/ n/v/CP, appetite not great   Objective:   BP 121/71 (BP Location: Right Arm)   Pulse 66   Temp 98.1 F (36.7 C)   Resp 18   Ht 5\' 3"  (1.6 m)   Wt (!) 238.6 kg   LMP 11/09/2012   SpO2 97%   BMI 93.18 kg/m   Intake/Output Summary (Last 24 hours) at 11/19/2019 1338 Last data filed at 11/19/2019 0900 Gross per 24 hour  Intake 480 ml  Output 330 ml  Net 150 ml   Weight change: 7.031 kg  Physical Exam: TZG:YFVCBSWH obese woman lying in bed in NAD, more dyspneic today QPR:FFMBW HS, no rub Resp:decreased BS GYK:ZLDJT, 2+ pitting edema of lower abdomen/panus Ext:2+ brawny edema bilaterally/anasarca to mid abdomen  Imaging: US RENAL  Result Date: 11/17/2019 CLINICAL DATA:  Acute kidney injury EXAM: RENAL / URINARY TRACT ULTRASOUND COMPLETE COMPARISON:  CT 11/08/2019, renal ultrasound 11/08/2019 FINDINGS: Right Kidney: Renal measurements: 10.2 x 5.6 x 4.1 cm = volume: 124 mL. Increased cortical echogenicity. No mass or hydronephrosis visualized. Left  Kidney: Nonvisualization of the left kidney secondary to poor sonographic window and patient habitus. Bladder: Decompressed by Foley catheter.  Poorly assessed sonographically. Other: None. IMPRESSION: Technically difficult exam given patient body habitus and poor sonographic windows. Nonvisualization of the left kidney. Increased cortical echogenicity in the right kidney. Compatible with medical renal disease. Bladder decompressed by Foley catheter. Electronically Signed   By: Lovena Le M.D.    On: 11/17/2019 18:05    Labs: BMET Recent Labs  Lab 11/13/19 0314 11/14/19 0905 11/15/19 0425 11/16/19 0538 11/17/19 0240 11/18/19 0322 11/19/19 0522  NA 135 134* 133* 133* 135 133* 133*  K 3.5 3.6 3.5 3.5 3.6 3.6 3.8  CL 97* 95* 95* 94* 95* 95* 93*  CO2 27 26 27 26 27 26 27   GLUCOSE 140* 163* 128* 142* 143* 122* 117*  BUN 88* 90* 94* 98* 99* 104* 109*  CREATININE 3.64* 3.63* 3.99* 4.23* 4.56* 4.81* 4.99*  CALCIUM 8.6* 8.8* 8.4* 8.4* 8.7* 8.5* 8.6*  PHOS  --   --   --   --  5.6* 5.7* 6.0*   CBC Recent Labs  Lab 11/13/19 0314 11/15/19 1225 11/17/19 0240 11/18/19 0322  WBC 5.1 5.0 5.5 5.3  NEUTROABS  --  3.3  --   --   HGB 8.9* 9.6* 9.4* 9.0*  HCT 30.0* 31.8* 31.2* 29.8*  MCV 90.9 90.6 90.7 91.4  PLT 154 166 163 160    Medications:    . allopurinol  300 mg Oral Daily  . Chlorhexidine Gluconate Cloth  6 each Topical Daily  . darbepoetin (ARANESP) injection - NON-DIALYSIS  40 mcg Subcutaneous Q Sun-1800  . heparin  5,000 Units Subcutaneous Q8H  . insulin aspart  0-6 Units Subcutaneous TID WC  . isosorbide-hydrALAZINE  1 tablet Oral BID  . metoprolol succinate  25 mg Oral Daily      Otelia Santee, MD 11/19/2019, 1:38 PM

## 2019-11-19 NOTE — Progress Notes (Addendum)
PROGRESS NOTE    [name]  BJS:[id number] DOB: 06-Jun-1966 DOA: 11/07/2019 PCP: Vonna Drafts, FNP  Brief Narrative: 54 year old w/ super morbid obesity, BMI of 93 (>500lbs) with chronic systolic heart failure/NICM EF 35%, diabetes mellitus, morbid obesity, chronic kidney disease 3-4 was brought in for bilateral lower extremity edema and worsening back pain due to a fall from the stretcher 2 weeks ago.  On arrival to ED she was found to be massively fluid overloaded with anasarca, acute kidney injury and acute on chronic systolic heart failure. Seen by Cardiology initially, started lasix and Renal consult recommended. -Nephrology consulted, she was treated with high-dose IV Lasix and metolazone -per renal not a longterm HD candidate -Palliative consulted & following -Kidney function continues to decline, urine output is poor as well  Assessment & Plan:   Acute on chronic stage IV kidney disease with hyperkalemia,  Acute on chronic systolic and diastolic CHF, EF 60% Anasarca Cardiorenal syndrome -Baseline creatinine around 2, on admission was 3.6  -Cardiology Dr.Patwardhan was consulted, started Diuretics -Nephrology following, no evidence of hydronephrosis -Felt to be hemodynamically mediated, secondary to Cardiorenal syndrome -Continues to have significant volume overload with anasarca, was treated with very high doses of IV Lasix and metolazone per Renal this admission with no response, finally lasix stopped yesterday  -Creatinine worsening up to 4.99 and BUN now 104, no response to diuretics -Palliative care also consulted and following -Called and updated daughters on numerous occasions, Per Renal she is not a Dialysis candidate, there is no option for longterm dialysis in their kidney center for pts over 500lbs and especially given nonambulatory status  AKI on CKD stage 4  Hyperkalemia -As above  Acute respiratory failure with hypoxia  Acute on chronic systolic and  diastolic CHF Super morbid obesity, OSA OHS -Diuretics as noted above, continue high-dose IV Lasix -Echo in December showed noted EF of 35% with global hypokinesis, RV and valves not well visualized, at least moderate pulmonary hypertension -Continue CPAP nightly and daytime naps -Ensure compliance with CPAP, discontinued all narcotics  Acute  Metabolic Encephalopathy: Suspect from ESRD and uremia.  -Improved, but does have increased somnolence -Discontinued OxyContin  Super morbid obesity:  -BMI of 90.1  Myoclonic jerks with some tremors  -Likely secondary to uremia.  Gabapentin on hold since admission -OxyContin discontinued  Chronic neck, back pain Chronic narcotic dependence CT of the cervical spine was done on 2/23 and it shows 1. No acute osseous abnormality identified in the cervical spine. Solid C4-C7 anterior fusion. -CT of the lumbar spine on admission shows No acute osseous abnormality identified in the lumbar spine. Mid and lower lumbar disc degeneration and advanced facet arthrosis with chronic severe spinal stenosis at L3-4 and at least moderate spinal stenosis at L4-5. Severe left neural foraminal stenosis at L5-S1. Pain control and PT evaluation. PT evaluation recommending SNF.  -Discontinued OxyContin,  -decreased Percocet dose, will discontinue this now especially with worsening uremia -Discontinue buprenorphine patch  Diabetes mellitus  -Hemoglobin A1c is 8.8  -CBG stable, continue sliding scale insulin   Abnormal UA/possibly asymptomatic pyuria Completed 5 days of IV rocephin last week  Iron deficiency anemia  anemia of chronic disease from stage IV CKD IV iron given this admission, -continue epo  Odynophagia Pt reports severe GERD, and has a hard time swallowing , reports early satiety and some times have hard time when food passes down to the stomach:  Esophagogram noted only mild distal esophageal narrowing  DVT prophylaxis: Subcutaneous  heparin  Code Status: DNR now  Family Communication:  updated daughter Alleen Borne 3/4 , 3/5   disposition Plan: Not stable for discharge is massively fluid overloaded with anasarca, poor response to diuretics, not a good dialysis candidate   Pressure injury present on admission Pressure Injury 11/12/17 Stage II -  Partial thickness loss of dermis presenting as a shallow open ulcer with a red, pink wound bed without slough. 2x1 cm,located in the lower L buttock (Active)  11/12/17 1045  Location: Buttocks  Location Orientation: Left  Staging: Stage II -  Partial thickness loss of dermis presenting as a shallow open ulcer with a red, pink wound bed without slough.  Wound Description (Comments): 2x1 cm,located in the lower L buttock  Present on Admission: Yes     Wound care consulted   Consultants:   Nephrology  Cardiology   palliative care  Procedures: CT of the abdomen without contrast CT of the cervical spine and lumbar spine without contrast  Antimicrobials: completed the course of Rocephin.   Subjective: -No new complaints, feels okay, no events overnight Objective: Vitals:   11/18/19 2300 11/19/19 0448 11/19/19 0500 11/19/19 0840  BP:  102/65  121/71  Pulse: 73 66  66  Resp: 18 18    Temp:  98.1 F (36.7 C)    TempSrc:      SpO2: 100% 97%  97%  Weight:   (!) 238.6 kg   Height:        Intake/Output Summary (Last 24 hours) at 11/19/2019 1221 Last data filed at 11/19/2019 0900 Gross per 24 hour  Intake 480 ml  Output 330 ml  Net 150 ml   Filed Weights   11/17/19 0539 11/18/19 0437 11/19/19 0500  Weight: (!) 230.9 kg (!) 231.6 kg (!) 238.6 kg    Examination:  Gen: Super morbidly obese female sitting up in bed, AAOx3, HEENT: Unable to assess JVD Lungs: Decreased breath sounds anteriorly CVS: RRR,No Gallops,Rubs or new Murmurs Abd: soft, Non tender, non distended, BS present, massive abdominal wall edema Extremities: Massive lower extremity edema extending  to upper thigh, abdominal wall  skin: left buttock stage 2 pressure injury.  Psychiatry: Mood is appropriate.     Data Reviewed: I have personally reviewed following labs and imaging studies  CBC: Recent Labs  Lab 11/13/19 0314 11/15/19 1225 11/17/19 0240 11/18/19 0322  WBC 5.1 5.0 5.5 5.3  NEUTROABS  --  3.3  --   --   HGB 8.9* 9.6* 9.4* 9.0*  HCT 30.0* 31.8* 31.2* 29.8*  MCV 90.9 90.6 90.7 91.4  PLT 154 166 163 295   Basic Metabolic Panel: Recent Labs  Lab 11/13/19 0314 11/14/19 0905 11/15/19 0425 11/16/19 0538 11/17/19 0240 11/18/19 0322 11/19/19 0522  NA 135   < > 133* 133* 135 133* 133*  K 3.5   < > 3.5 3.5 3.6 3.6 3.8  CL 97*   < > 95* 94* 95* 95* 93*  CO2 27   < > 27 26 27 26 27   GLUCOSE 140*   < > 128* 142* 143* 122* 117*  BUN 88*   < > 94* 98* 99* 104* 109*  CREATININE 3.64*   < > 3.99* 4.23* 4.56* 4.81* 4.99*  CALCIUM 8.6*   < > 8.4* 8.4* 8.7* 8.5* 8.6*  MG 2.2  --   --   --   --   --   --   PHOS  --   --   --   --  5.6*  5.7* 6.0*   < > = values in this interval not displayed.   GFR: Estimated Creatinine Clearance: 26.4 mL/min (A) (by C-G formula based on SCr of 4.99 mg/dL (H)). Liver Function Tests: Recent Labs  Lab 11/17/19 0240 11/18/19 0322 11/19/19 0522  ALBUMIN 3.0* 2.9* 3.0*   No results for input(s): LIPASE, AMYLASE in the last 168 hours. No results for input(s): AMMONIA in the last 168 hours. Coagulation Profile: No results for input(s): INR, PROTIME in the last 168 hours. Cardiac Enzymes: No results for input(s): CKTOTAL, CKMB, CKMBINDEX, TROPONINI in the last 168 hours. BNP (last 3 results) No results for input(s): PROBNP in the last 8760 hours. HbA1C: No results for input(s): HGBA1C in the last 72 hours. CBG: Recent Labs  Lab 11/18/19 1123 11/18/19 1609 11/18/19 2120 11/19/19 0634 11/19/19 1114  GLUCAP 153* 154* 140* 127* 128*   Lipid Profile: No results for input(s): CHOL, HDL, LDLCALC, TRIG, CHOLHDL, LDLDIRECT in the  last 72 hours. Thyroid Function Tests: No results for input(s): TSH, T4TOTAL, FREET4, T3FREE, THYROIDAB in the last 72 hours. Anemia Panel: No results for input(s): VITAMINB12, FOLATE, FERRITIN, TIBC, IRON, RETICCTPCT in the last 72 hours. Sepsis Labs: No results for input(s): PROCALCITON, LATICACIDVEN in the last 168 hours.  No results found for this or any previous visit (from the past 240 hour(s)).       Radiology Studies: US RENAL  Result Date: 11/17/2019 CLINICAL DATA:  Acute kidney injury EXAM: RENAL / URINARY TRACT ULTRASOUND COMPLETE COMPARISON:  CT 11/08/2019, renal ultrasound 11/08/2019 FINDINGS: Right Kidney: Renal measurements: 10.2 x 5.6 x 4.1 cm = volume: 124 mL. Increased cortical echogenicity. No mass or hydronephrosis visualized. Left Kidney: Nonvisualization of the left kidney secondary to poor sonographic window and patient habitus. Bladder: Decompressed by Foley catheter.  Poorly assessed sonographically. Other: None. IMPRESSION: Technically difficult exam given patient body habitus and poor sonographic windows. Nonvisualization of the left kidney. Increased cortical echogenicity in the right kidney. Compatible with medical renal disease. Bladder decompressed by Foley catheter. Electronically Signed   By: Lovena Le M.D.   On: 11/17/2019 18:05        Scheduled Meds: . allopurinol  300 mg Oral Daily  . Chlorhexidine Gluconate Cloth  6 each Topical Daily  . darbepoetin (ARANESP) injection - NON-DIALYSIS  40 mcg Subcutaneous Q Sun-1800  . heparin  5,000 Units Subcutaneous Q8H  . insulin aspart  0-6 Units Subcutaneous TID WC  . isosorbide-hydrALAZINE  1 tablet Oral BID  . metoprolol succinate  25 mg Oral Daily   Continuous Infusions: . sodium chloride Stopped (11/17/19 0318)     LOS: 12 days    Domenic Polite, MD [facility]  11/19/2019, 12:21 PM

## 2019-11-19 NOTE — Progress Notes (Signed)
[facility]) Hospital Liaison: RN note  Notified by Milderd Meager with the [facility] team of paitent/family request for [facility] services at home after discharge. Chart and patient information under review by [facility] physician. Hospice eligibility pending at this time.   Writer spoke with daughter Terrence Dupont to initiate education related to hospice philosophy, services and team approach to care. Patient/family verbalized understanding of information given. Per discussion, plan is for discharge to home by [facility] on Sunday.  Please send signed and completed out of facility DNR form home with patient/family.  Patient will need prescriptions for discharge comfort medications.  DME needs discussed, patient ready has the following equipment: Hospital bed and overhead bar. Family requests the following equipment for delivery to the home Home [employer]. [facility] equipment specialist has been notified and will contact [place] to arrange delivery to the home. Home Address has been verified and is correct in the chart. [name] [phone]) is the family contact to arrange time of equipment delivery.  [facility] aware of the above. Please fax complete discharge summary to [facility] [phone] when final. Please notify [facility] when pt is ready to leave the unit at discharge. Please call [phone] between 8:30 and 5 pm. Call [phone] after 5 pm. [facility] information and contact number given to [name]. Above information shared with [facility] team.   Please call with hospice related questions.  Thank you for this referral.  Ellie Lunch [facility] Liaison [phone]  [place] are listed daily on AMION under Hospice and [place].

## 2019-11-19 NOTE — Progress Notes (Addendum)
Palliative Medicine Inpatient Follow Up Note HPI: [name] a 54 y.o.femalewithhistory of chronic systolic heart failure, diabetes mellitus, morbid obesity, chronic kidney disease who was admitted January last month for CHF exacerbation subsequent which patient came back to the ER the following week for accidental overdose of oxycodone presents to the ER after patient has been having increasing neck pain and low back pain after having a fall about 2 weeks ago. Denies any weakness of the extremities or any incontinence of urine or bowel. Patient has noted increasing tremors of the extremities. Patient also noted increasing bilateral lower extremity edema with no definite shortness of breath or chest pain.  2/26 - Palliative care was asked to be involved to help with goals of care conversations in the setting of reduced renal function. Patient thought to be a poor OP HD candidate given her morbid obesity.   2/28 - Patient was due to go home early in week, had slight improvement in kidney function.   3/5 - Patients kidney function worsening. Cardiorenal picture. Not a good dialysis candidate.   3/6 - Patient said that she would like to transfer home with hospice. She agrees to being a DNR  Today's Discussion (11/19/2019): Chart reviewed. Patient remains to be doing poorly, intermittently drifting off. She is able to carry on a conversation though is noted to be shaking in her hands. I shared that I worry she has little time left to make big decisions such as where she would like to be if she is facing the end of her life. She vocalized that she would like to be in her home. She stated that she is now agreeable to going home on hospice after talking to her daughters.  We discussed her code status. We talked about a major cardiac event and the liklihood of poor outcomes thereafter.  She is DNAR/DNI at the present time.   Discussed with patient the importance of continued conversation  with family and their  medical providers regarding overall plan of care and treatment options, ensuring decisions are within the context of the patients values and GOCs.  Questions and concerns addressed   We plan to follow up tomorrow  Vital Signs Vitals:   11/19/19 0448 11/19/19 0840  BP: 102/65 121/71  Pulse: 66 66  Resp: 18   Temp: 98.1 F (36.7 C)   SpO2: 97% 97%    Intake/Output Summary (Last 24 hours) at 11/19/2019 1355 Last data filed at 11/19/2019 0900 Gross per 24 hour  Intake 480 ml  Output 330 ml  Net 150 ml   Last Weight  Most recent update: 11/19/2019  5:55 AM   Weight  238.6 kg (526 lb)            Physical Exam Vitals and nursing note reviewed.  HENT:     Head: Normocephalic.     Nose: Nose normal.     Mouth/Throat:     Mouth: Mucous membranes are dry.  Eyes:     Pupils: Pupils are equal, round, and reactive to light.  Cardiovascular:     Rate and Rhythm: Normal rate and regular rhythm.     Pulses: Normal pulses.  Pulmonary:     Comments: 3LPM [place], diffuse crackles, labored Abdominal:     Palpations: Abdomen is soft.  Musculoskeletal:     Cervical back: Normal range of motion.  Skin:    General: Skin is dry.     Capillary Refill: Capillary refill takes less than 2 seconds.  Neurological:     Mental Status: She is alert and oriented to person, place, and time, though intermittently lethargic  SUMMARY OF RECOMMENDATIONS   Patient is now DNR  Hospice consultation placed  Plan for primary to get heart failure teams input prior to formal discharge  Ongoing [place] discussions  Time Spent: 25 Greater than 50% of the time was spent in counseling and coordination of care ______________________________________________________________________________________ [place] Team Team Cell Phone: [phone] Please utilize secure chat with additional questions, if there is no response within 30 minutes please call the  above phone number  Palliative Medicine Team providers are available by phone from 7am to 7pm daily and can be reached through the team cell phone.  Should this patient require assistance outside of these hours, please call the patient's attending physician.

## 2019-11-20 ENCOUNTER — Inpatient Hospital Stay (HOSPITAL_COMMUNITY): Payer: Medicare Other

## 2019-11-20 LAB — GLUCOSE, CAPILLARY
Glucose-Capillary: 113 mg/dL — ABNORMAL HIGH (ref 70–99)
Glucose-Capillary: 116 mg/dL — ABNORMAL HIGH (ref 70–99)
Glucose-Capillary: 111 mg/dL — ABNORMAL HIGH (ref 70–99)
Glucose-Capillary: 125 mg/dL — ABNORMAL HIGH (ref 70–99)

## 2019-11-20 LAB — RENAL FUNCTION PANEL
Albumin: 2.9 g/dL — ABNORMAL LOW (ref 3.5–5.0)
Anion gap: 16 — ABNORMAL HIGH (ref 5–15)
BUN: 111 mg/dL — ABNORMAL HIGH (ref 6–20)
CO2: 23 mmol/L (ref 22–32)
Calcium: 8.6 mg/dL — ABNORMAL LOW (ref 8.9–10.3)
Chloride: 96 mmol/L — ABNORMAL LOW (ref 98–111)
Creatinine, Ser: 4.83 mg/dL — ABNORMAL HIGH (ref 0.44–1.00)
GFR calc Af Amer: 11 mL/min — ABNORMAL LOW (ref >60)
GFR calc non Af Amer: 10 mL/min — ABNORMAL LOW (ref >60)
Glucose, Bld: 104 mg/dL — ABNORMAL HIGH (ref 70–99)
Phosphorus: 5.7 mg/dL — ABNORMAL HIGH (ref 2.5–4.6)
Potassium: 4.1 mmol/L (ref 3.5–5.1)
Sodium: 135 mmol/L (ref 135–145)

## 2019-11-20 LAB — ECHOCARDIOGRAM COMPLETE
Height: 63 in
Weight: 8444.8 oz

## 2019-11-20 MED ORDER — PERFLUTREN LIPID MICROSPHERE
1.0000 mL | INTRAVENOUS | Status: AC | PRN
  Administered 2019-11-20: 2 mL via INTRAVENOUS
  Filled 2019-11-20: qty 10

## 2019-11-20 NOTE — Progress Notes (Addendum)
[facility] Progress Note    Assessment/ Plan:   1. AKI/CKD stage 3b- in setting of decompensated combined systolic and diastolic CHF and likely cardiorenal syndrome (low FeNa). She had initially stabilized her Scr and reached a nadir of 3.63 on 11/14/19. Unfortunately she has been more hypotensive with SBP's in the 90's over the past 48 hours and her BUN and Cr have started to climb with decreased UOP from her foley catheter. Bladder scan was not successful due to significant anasarca of her soft tissue/panus. Her lasix dose was increased to 120 mg IV q12hr dosing  but unfortunately stopped responding. Now likely has ischemic ATN component to cardiorenal syndrome. 1. Difficult situation as she is not currently a candidate for outpatient dialysis given her super morbid obesity and bed-bound status.She would require placement in a longterm care facility that would provide HD in a bed as well as PT/OT for deconditioning. She wouldneedto lose enough body weight (even with UF) to get below the 350 lb max -> she doesn't have 150 lbs of fluid onboard. 2. Palliative care has been consulted and appreciate facilitating the discussions with the family 3. I would recommend not initiating RRT bec likely she would become dialysis dependent and she is not a candidate for long term dialysis but will await cardiology input. Even tho the numbers are fairly stable today clinically she appears worse.  2. Acute on chronic combined diastolic and systolic CHF NICM w/ EF 07% - massively volume overloaded and had not been responding to  IV lasix - Per cardiology there is nothing further that can be done and inotropes unlikely to alter prognosis and outcome.  3. Acute respiratory failure with hypoxia- in settings decompensated CHF and super morbid obesity/OSA OHS- currently on 3 liters via [place]. 4. Anemia of CKD- s/p IV feraheme x 2 (last dose given 11/15/19) and ESA. 5. Super morbid obesity- basically  remains bed bound for months  Subjective:   Denies dyspnea as long as she's not exerting herself; denies  n/v/CP, appetite not great   Objective:   BP 132/72   Pulse (!) 101   Temp 97.6 F (36.4 C) (Oral)   Resp 20   Ht 5\' 3"  (1.6 m)   Wt (!) 239.4 kg   LMP 11/09/2012   SpO2 95%   BMI 93.50 kg/m   Intake/Output Summary (Last 24 hours) at 11/20/2019 1014 Last data filed at 11/20/2019 0900 Gross per 24 hour  Intake 240 ml  Output 915 ml  Net -675 ml   Weight change: 0.816 kg  Physical Exam: PXT:GGYIRSWN obese woman lying in bed in NAD, more dyspneic today IOE:VOJJK HS, no rub Resp:decreased BS KXF:GHWEX, 2+ pitting edema of lower abdomen/panus Ext:2+ brawny edema bilaterally/anasarca to mid abdomen  Imaging: No results found.  Labs: BMET Recent Labs  Lab 11/14/19 0905 11/15/19 0425 11/16/19 0538 11/17/19 0240 11/18/19 0322 11/19/19 0522 11/20/19 0339  NA 134* 133* 133* 135 133* 133* 135  K 3.6 3.5 3.5 3.6 3.6 3.8 4.1  CL 95* 95* 94* 95* 95* 93* 96*  CO2 26 27 26 27 26 27 23   GLUCOSE 163* 128* 142* 143* 122* 117* 104*  BUN 90* 94* 98* 99* 104* 109* 111*  CREATININE 3.63* 3.99* 4.23* 4.56* 4.81* 4.99* 4.83*  CALCIUM 8.8* 8.4* 8.4* 8.7* 8.5* 8.6* 8.6*  PHOS  --   --   --  5.6* 5.7* 6.0* 5.7*   CBC Recent Labs  Lab 11/15/19 1225 11/17/19 0240 11/18/19 0322  WBC  5.0 5.5 5.3  NEUTROABS 3.3  --   --   HGB 9.6* 9.4* 9.0*  HCT 31.8* 31.2* 29.8*  MCV 90.6 90.7 91.4  PLT 166 163 160    Medications:    . allopurinol  300 mg Oral Daily  . Chlorhexidine Gluconate Cloth  6 each Topical Daily  . darbepoetin (ARANESP) injection - NON-DIALYSIS  40 mcg Subcutaneous Q Sun-1800  . heparin  5,000 Units Subcutaneous Q8H  . insulin aspart  0-6 Units Subcutaneous TID WC  . isosorbide-hydrALAZINE  1 tablet Oral BID  . metoprolol succinate  25 mg Oral Daily      Otelia Santee, MD 11/20/2019, 10:14 AM

## 2019-11-20 NOTE — Progress Notes (Signed)
Echocardiogram and chart reviewed.  Patient has had worsening acute systolic and acute renal failure throughout the hospital stay.  Overall, prognosis is poor. Due to her comorbidities, morbid obesity, I do not think she would be a candidate for any meaningful long term advanced heart failure treatment options.  Nigel Mormon, MD [facility] [place] Pager: [phone] Office: [phone]

## 2019-11-20 NOTE — Progress Notes (Signed)
Palliative Medicine Inpatient Follow Up Note HPI: [name] Hallis a 54 y.o.femalewithhistory of chronic systolic heart failure, diabetes mellitus, morbid obesity, chronic kidney disease who was admitted January last month for CHF exacerbation subsequent which patient came back to the ER the following week for accidental overdose of oxycodone presents to the ER after patient has been having increasing neck pain and low back pain after having a fall about 2 weeks ago. Denies any weakness of the extremities or any incontinence of urine or bowel. Patient has noted increasing tremors of the extremities. Patient also noted increasing bilateral lower extremity edema with no definite shortness of breath or chest pain.  2/26 - Palliative care was asked to be involved to help with goals of care conversations in the setting of reduced renal function. Patient thought to be a poor OP HD candidate given her morbid obesity.   2/28 - Patient was due to go home early in week, had slight improvement in kidney function.   3/5 - Patients kidney function worsening. Cardiorenal picture. Not a good dialysis candidate.   3/6 - Patient said that she would like to transfer home with hospice. She agrees to being a DNR  3/7 - [facility] delivering equipment to patients home. Heart failure team evaluated patient  Today's Discussion (11/20/2019): Chart reviewed. Patient evaluated at bedside, she was noted to be on bipap this evening, no family present at bedside.   Heart failure team evaluated patient in the setting of her co-morbid conditions she is not a candidate for additional therapies.   Discussed with patient the importance of continued conversation with family and their  medical providers regarding overall plan of care and treatment options, ensuring decisions are within the context of the patients values and GOCs.  Vital Signs Vitals:   11/20/19 0828 11/20/19 0900  BP: 132/72   Pulse: (!) 101     Resp:    Temp:    SpO2:  95%    Intake/Output Summary (Last 24 hours) at 11/20/2019 1747 Last data filed at 11/20/2019 0900 Gross per 24 hour  Intake 240 ml  Output 515 ml  Net -275 ml   Last Weight  Most recent update: 11/20/2019  5:33 AM   Weight  239.4 kg (527 lb 12.8 oz)            Physical Exam Vitals and nursing note reviewed.  HENT:     Head: Normocephalic.     Nose: Nose normal.     Mouth/Throat:     Mouth: Mucous membranes are dry.  Eyes:     Pupils: Pupils are equal, round, and reactive to light.  Cardiovascular:     Rate and Rhythm: Normal rate and regular rhythm.     Pulses: Normal pulses.  Pulmonary:     Comments: On Bipap Abdominal:     Palpations: Abdomen is soft.  Musculoskeletal:     Cervical back: Normal range of motion.  Skin:    General: Skin is dry.     Capillary Refill: Capillary refill takes less than 2 seconds.  Neurological:     Mental Status: She is alert and oriented to person, place, and time, though intermittently lethargic  SUMMARY OF RECOMMENDATIONS   Patient is now DNR  Plan to go home with [facility] tomorrow  Time Spent: 15 Greater than 50% of the time was spent in counseling and coordination of care ______________________________________________________________________________________ [place] Team Team Cell Phone: [phone] Please utilize secure chat with additional  questions, if there is no response within 30 minutes please call the above phone number  Palliative Medicine Team providers are available by phone from 7am to 7pm daily and can be reached through the team cell phone.  Should this patient require assistance outside of these hours, please call the patient's attending physician.

## 2019-11-20 NOTE — Progress Notes (Addendum)
Spoke with Raina Mina with [facility] they are working on DME arrangements for home 02 and home CPAP with [employer]- (no orders needed from MD here)- [employer] will assist in sending RT to hospital bedside for CPAP settings and fitting prior to discharge  home per [facility]. Lattie Haw also states that she has checked into pt's PCS services and pt will be able to keep her aides under Medicaid in addition to Hospice services. Pt will need to transport home via non emergent EMS.   Marvetta Gibbons [facility] Weekend Coverage [phone]

## 2019-11-20 NOTE — Progress Notes (Signed)
  Echocardiogram 2D Echocardiogram has been performed.  [name] A Maurisha Mongeau 11/20/2019, 10:02 AM

## 2019-11-20 NOTE — Progress Notes (Signed)
[id number]-[facility]) Hospital Liaison: RN note @1040   Liaison spoke with nurse who indicated pt has difficult with her mobilility on yesterday and unable to ambulate safety. Pt would not be able to go to the store for fitting and processing for her CPAP as planned. Therefore liaison has arranged for [place] via a respiratory to set and fit of the device while inpt. Pt will transport with the device to her place of residence via ambulance transport on Monday.  Liaison has spoke with Christy RNCM (TOC) concerning awaiting labs and test results for later today. Plans are for pt to be discharged on Monday. Liaison has updated the pt's daughter [name] with plans. Also verified pt's home O2 has been delivered. No other inquires or request at this time.  Please call with any hospice related questions.  Raina Mina, RN,BSN [facility] (on [place]) [phone]

## 2019-11-20 NOTE — Progress Notes (Signed)
PROGRESS NOTE    [name]  DQQ:[id number] DOB: [birth year] DOA: 11/07/2019 PCP: Vonna Drafts, FNP  Brief Narrative: 54 year old w/ super morbid obesity, BMI of 93 (>500lbs) with chronic systolic heart failure/NICM EF 35%, diabetes mellitus, morbid obesity, chronic kidney disease 3-4 was brought in for bilateral lower extremity edema and worsening back pain due to a fall from the stretcher 2 weeks ago.  On arrival to ED she was found to be massively fluid overloaded with anasarca, acute kidney injury and acute on chronic systolic heart failure. Seen by Cardiology initially, started lasix and Renal consult recommended. -Nephrology consulted, she was treated with high-dose IV Lasix and metolazone -per renal not a longterm HD candidate -Palliative consulted & following -Kidney function continues to decline, urine output is poor as well  Assessment & Plan:   Acute on chronic stage IV kidney disease with hyperkalemia,  Acute on chronic systolic and diastolic CHF, EF 19% Anasarca Cardiorenal syndrome -Baseline creatinine around 2, on admission was 3.6  -Cardiology Dr.Patwardhan was consulted, started Diuretics -Nephrology following, no evidence of hydronephrosis -Felt to be hemodynamically mediated, secondary to Cardiorenal syndrome -Continues to have significant volume overload with anasarca, was treated with very high doses of IV Lasix and metolazone per Renal this admission with no response, finally lasix stopped 3/6 -Creatinine 4.8 and BUN 111, no response to diuretics -Extremely difficult situation, repeat echocardiogram with EF of 20 to 25%, global hypokinesis, at least moderate pulmonary hypertension -Palliative care also consulted and following -Per Renal she is not a Dialysis candidate, there is no option for longterm dialysis in their kidney center for pts over 500lbs and especially given nonambulatory status -Case reviewed with patient's primary cardiologist Dr. Virgina Jock,  she is not felt to be a candidate for advanced heart failure therapy, case was also discussed with Dr. Haroldine Laws from [place] team who agrees -Palliative consult, input greatly appreciated, plan discharge home with hospice services tomorrow  AKI on CKD stage 4  Hyperkalemia -As above  Acute respiratory failure with hypoxia  Acute on chronic systolic and diastolic CHF Super morbid obesity, OSA OHS -Diuretics as noted above, continue high-dose IV Lasix -Echo in December showed noted EF of 35% with global hypokinesis, RV and valves not well visualized, at least moderate pulmonary hypertension -Continue CPAP nightly and daytime naps -Ensure compliance with CPAP, discontinued all narcotics  Acute  Metabolic Encephalopathy: Suspect from ESRD and uremia.  -Improved, but does have increased somnolence -Discontinued OxyContin  Super morbid obesity:  -BMI of 90.1  Myoclonic jerks with some tremors  -Likely secondary to uremia.  Gabapentin on hold since admission -OxyContin discontinued  Chronic neck, back pain Chronic narcotic dependence CT of the cervical spine was done on 2/23 and it shows 1. No acute osseous abnormality identified in the cervical spine. Solid C4-C7 anterior fusion. -CT of the lumbar spine on admission shows No acute osseous abnormality identified in the lumbar spine. Mid and lower lumbar disc degeneration and advanced facet arthrosis with chronic severe spinal stenosis at L3-4 and at least moderate spinal stenosis at L4-5. Severe left neural foraminal stenosis at L5-S1. Pain control and PT evaluation. PT evaluation recommending SNF.  -Discontinued OxyContin,  -decreased Percocet dose, will discontinue this now especially with worsening uremia -Discontinue buprenorphine patch  Diabetes mellitus  -Hemoglobin A1c is 8.8  -CBG stable, continue sliding scale insulin   Abnormal UA/possibly asymptomatic pyuria Completed 5 days of IV rocephin last week  Iron deficiency  anemia  anemia of chronic disease from stage  IV CKD IV iron given this admission, -continue epo  Odynophagia Pt reports severe GERD, and has a hard time swallowing , reports early satiety and some times have hard time when food passes down to the stomach:  Esophagogram noted only mild distal esophageal narrowing  DVT prophylaxis: Subcutaneous heparin Code Status: DNR now  Family Communication:  updated daughter [name] 3/4 , 3/5, 3/6   disposition Plan: Likely home with hospice services tomorrow 3/8   Pressure injury present on admission Pressure Injury 11/12/17 Stage II -  Partial thickness loss of dermis presenting as a shallow open ulcer with a red, pink wound bed without slough. 2x1 cm,located in the lower L buttock (Active)  11/12/17 1045  Location: Buttocks  Location Orientation: Left  Staging: Stage II -  Partial thickness loss of dermis presenting as a shallow open ulcer with a red, pink wound bed without slough.  Wound Description (Comments): 2x1 cm,located in the lower L buttock  Present on Admission: Yes     Wound care consulted   Consultants:   Nephrology  Cardiology   palliative care  Procedures: CT of the abdomen without contrast CT of the cervical spine and lumbar spine without contrast  Antimicrobials: completed the course of Rocephin.   Subjective: -No events overnight, slightly more drowsy today Objective: Vitals:   11/19/19 2100 11/20/19 0531 11/20/19 0828 11/20/19 0900  BP:  126/67 132/72   Pulse: 88 93 (!) 101   Resp: 16 20    Temp:  97.6 F (36.4 C)    TempSrc:  Oral    SpO2: 99% 99%  95%  Weight:  (!) 239.4 kg    Height:        Intake/Output Summary (Last 24 hours) at 11/20/2019 1455 Last data filed at 11/20/2019 0900 Gross per 24 hour  Intake 240 ml  Output 515 ml  Net -275 ml   Filed Weights   11/18/19 0437 11/19/19 0500 11/20/19 0531  Weight: (!) 231.6 kg (!) 238.6 kg (!) 239.4 kg    Examination:  Gen: Super morbid obese  female laying in bed, somnolent but arousable, oriented x3 HEENT: Obese unable to assess JVD Lungs: Distant breath sounds anteriorly CVS: RRR,No Gallops,Rubs or new Murmurs Abd: soft, Non tender, non distended, BS present, large abdominal wall edema Extremities: : Massive lower extremity edema extending to upper thigh, abdominal wall  skin: left buttock stage 2 pressure injury.  Psychiatry: Mood is appropriate.     Data Reviewed: I have personally reviewed following labs and imaging studies  CBC: Recent Labs  Lab 11/15/19 1225 11/17/19 0240 11/18/19 0322  WBC 5.0 5.5 5.3  NEUTROABS 3.3  --   --   HGB 9.6* 9.4* 9.0*  HCT 31.8* 31.2* 29.8*  MCV 90.6 90.7 91.4  PLT 166 163 188   Basic Metabolic Panel: Recent Labs  Lab 11/16/19 0538 11/17/19 0240 11/18/19 0322 11/19/19 0522 11/20/19 0339  NA 133* 135 133* 133* 135  K 3.5 3.6 3.6 3.8 4.1  CL 94* 95* 95* 93* 96*  CO2 26 27 26 27 23   GLUCOSE 142* 143* 122* 117* 104*  BUN 98* 99* 104* 109* 111*  CREATININE 4.23* 4.56* 4.81* 4.99* 4.83*  CALCIUM 8.4* 8.7* 8.5* 8.6* 8.6*  PHOS  --  5.6* 5.7* 6.0* 5.7*   GFR: Estimated Creatinine Clearance: 27.4 mL/min (A) (by C-G formula based on SCr of 4.83 mg/dL (H)). Liver Function Tests: Recent Labs  Lab 11/17/19 0240 11/18/19 0322 11/19/19 0522 11/20/19 0339  ALBUMIN  3.0* 2.9* 3.0* 2.9*   No results for input(s): LIPASE, AMYLASE in the last 168 hours. No results for input(s): AMMONIA in the last 168 hours. Coagulation Profile: No results for input(s): INR, PROTIME in the last 168 hours. Cardiac Enzymes: No results for input(s): CKTOTAL, CKMB, CKMBINDEX, TROPONINI in the last 168 hours. BNP (last 3 results) No results for input(s): PROBNP in the last 8760 hours. HbA1C: No results for input(s): HGBA1C in the last 72 hours. CBG: Recent Labs  Lab 11/19/19 1114 11/19/19 1633 11/19/19 2124 11/20/19 0615 11/20/19 1128  GLUCAP 128* 132* 118* 113* 116*   Lipid Profile: No  results for input(s): CHOL, HDL, LDLCALC, TRIG, CHOLHDL, LDLDIRECT in the last 72 hours. Thyroid Function Tests: No results for input(s): TSH, T4TOTAL, FREET4, T3FREE, THYROIDAB in the last 72 hours. Anemia Panel: No results for input(s): VITAMINB12, FOLATE, FERRITIN, TIBC, IRON, RETICCTPCT in the last 72 hours. Sepsis Labs: No results for input(s): PROCALCITON, LATICACIDVEN in the last 168 hours.  No results found for this or any previous visit (from the past 240 hour(s)).       Radiology Studies: ECHOCARDIOGRAM COMPLETE  Result Date: 11/20/2019    ECHOCARDIOGRAM REPORT   Patient Name:   [name] Date of Exam: 11/20/2019 Medical Rec #:  [id number]        Height:       63.0 in Accession #:    [id number]       Weight:       527.8 lb Date of Birth:  [birth year]         BSA:          2.921 m Patient Age:    85 years         BP:           132/72 mmHg Patient Gender: F                HR:           101 bpm. Exam Location:  Inpatient Procedure: Intracardiac Opacification Agent Indications:    CHF-Acute Systolic 983.38 / S50.53  History:        Patient has prior history of Echocardiogram examinations, most                 recent 09/12/2019. CHF; Risk Factors:Diabetes, Hypertension,                 Sleep Apnea and Tobacco abuse. Chronic kidney disease                 Acute renal failure.  Sonographer:    Vikki Ports Turrentine Referring Phys: [place]  1. The left ventricular internal cavity size was mildly dilated. Left ventricular ejection fraction, by estimation, is 20-25%. The left ventricle has severely decreased function. Severe global hypokinesis with abnormal septal wall motion due to LBBB. Left ventricular diastolic parameters are consistent with Grade II diastolic dysfunction (pseudonormalization)  2. Right ventricular systolic function is normal. The right ventricular size is normal.  3. Right atrial size was mildly dilated.  4. Aortic valve not well visualized. Mild aortic  stenosis. Vmax 2.3 m/sec, mean PG 9 mmHg, AVA 2.3 cm2/m2.  5. Moderate tricuaspid regurgitation.  6. At least moderate pulmonary hypertension. Estimated PASP 60 mmHg.  7. Compared to previous study in 08/2019, LVEF has reduced from 35-40% to 20-25%. Filling pressures are elevated. FINDINGS  Left Ventricle: Left ventricular ejection fraction, by estimation, is <20%. The left ventricle has severely decreased function.  The left ventricle demonstrates regional wall motion abnormalities. Definity contrast agent was given IV to delineate the left ventricular endocardial borders. The left ventricular internal cavity size was mildly dilated. There is no left ventricular hypertrophy. Abnormal (paradoxical) septal motion, consistent with left bundle branch block. Left ventricular diastolic parameters are consistent with Grade II diastolic dysfunction (pseudonormalization). Right Ventricle: The right ventricular size is normal. No increase in right ventricular wall thickness. Right ventricular systolic function is normal. There is moderately elevated pulmonary artery systolic pressure. The tricuspid regurgitant velocity is 3.45 m/s, and with an assumed right atrial pressure of 15 mmHg, the estimated right ventricular systolic pressure is 97.3 mmHg. Left Atrium: Left atrial size was normal in size. Right Atrium: Right atrial size was mildly dilated. Pericardium: There is no evidence of pericardial effusion. Mitral Valve: The mitral valve is grossly normal. No evidence of mitral valve regurgitation. Tricuspid Valve: The tricuspid valve is grossly normal. Tricuspid valve regurgitation is moderate. Aortic Valve: Vmax 2.3 m/sec, mean PG 9 mmHg, AVA 2.3 cm2/m2. The aortic valve was not well visualized. Aortic valve regurgitation is not visualized. Mild aortic stenosis is present. Aortic valve mean gradient measures 9.0 mmHg. Aortic valve peak gradient measures 13.7 mmHg. Aortic valve area, by VTI measures 2.32 cm. Pulmonic Valve:  The pulmonic valve was not well visualized. Pulmonic valve regurgitation Not well visualized. Aorta: The aortic root is normal in size and structure. Venous: The inferior vena cava is dilated in size with less than 50% respiratory variability, suggesting right atrial pressure of 15 mmHg. IAS/Shunts: The interatrial septum was not assessed.  LEFT VENTRICLE PLAX 2D LVIDd:         5.60 cm  Diastology LVIDs:         4.15 cm  LV e' medial:   6.85 cm/s LV PW:         1.00 cm  LV E/e' medial: 17.8 LV IVS:        1.00 cm LVOT diam:     2.00 cm LV SV:         88 LV SV Index:   30 LVOT Area:     3.14 cm  RIGHT VENTRICLE RV S prime:     11.20 cm/s TAPSE (M-mode): 2.3 cm LEFT ATRIUM           Index       RIGHT ATRIUM           Index LA diam:      4.80 cm 1.64 cm/m  RA Area:     23.60 cm LA Vol (A4C): 59.2 ml 20.27 ml/m RA Volume:   78.30 ml  26.81 ml/m  AORTIC VALVE AV Area (Vmax):    2.85 cm AV Area (Vmean):   2.22 cm AV Area (VTI):     2.32 cm AV Vmax:           185.00 cm/s AV Vmean:          146.000 cm/s AV VTI:            0.381 m AV Peak Grad:      13.7 mmHg AV Mean Grad:      9.0 mmHg LVOT Vmax:         168.00 cm/s LVOT Vmean:        103.000 cm/s LVOT VTI:          0.281 m LVOT/AV VTI ratio: 0.74  AORTA Ao Root diam: 2.50 cm MITRAL VALVE  TRICUSPID VALVE MV Area (PHT): 6.71 cm     TR Peak grad:   47.6 mmHg MV Decel Time: 113 msec     TR Vmax:        345.00 cm/s MV E velocity: 122.00 cm/s MV A velocity: 119.00 cm/s  SHUNTS MV E/A ratio:  1.03         Systemic VTI:  0.28 m                             Systemic Diam: 2.00 cm Vernell Leep MD Electronically signed by Vernell Leep MD Signature Date/Time: 11/20/2019/11:19:52 AM    Final         Scheduled Meds: . allopurinol  300 mg Oral Daily  . Chlorhexidine Gluconate Cloth  6 each Topical Daily  . darbepoetin (ARANESP) injection - NON-DIALYSIS  40 mcg Subcutaneous Q Sun-1800  . heparin  5,000 Units Subcutaneous Q8H  . insulin aspart   0-6 Units Subcutaneous TID WC  . isosorbide-hydrALAZINE  1 tablet Oral BID  . metoprolol succinate  25 mg Oral Daily   Continuous Infusions: . sodium chloride Stopped (11/17/19 0318)     LOS: 13 days    Domenic Polite, MD [facility]  11/20/2019, 2:55 PM

## 2019-11-21 LAB — GLUCOSE, CAPILLARY
Glucose-Capillary: 117 mg/dL — ABNORMAL HIGH (ref 70–99)
Glucose-Capillary: 140 mg/dL — ABNORMAL HIGH (ref 70–99)

## 2019-11-21 LAB — RENAL FUNCTION PANEL
Albumin: 3.1 g/dL — ABNORMAL LOW (ref 3.5–5.0)
Anion gap: 12 (ref 5–15)
BUN: 112 mg/dL — ABNORMAL HIGH (ref 6–20)
CO2: 27 mmol/L (ref 22–32)
Calcium: 8.7 mg/dL — ABNORMAL LOW (ref 8.9–10.3)
Chloride: 97 mmol/L — ABNORMAL LOW (ref 98–111)
Creatinine, Ser: 4.64 mg/dL — ABNORMAL HIGH (ref 0.44–1.00)
GFR calc Af Amer: 12 mL/min — ABNORMAL LOW (ref >60)
GFR calc non Af Amer: 10 mL/min — ABNORMAL LOW (ref >60)
Glucose, Bld: 118 mg/dL — ABNORMAL HIGH (ref 70–99)
Phosphorus: 5.9 mg/dL — ABNORMAL HIGH (ref 2.5–4.6)
Potassium: 4.1 mmol/L (ref 3.5–5.1)
Sodium: 136 mmol/L (ref 135–145)

## 2019-11-21 NOTE — Progress Notes (Signed)
Plan noted for patient to go home with hospice care today-  Is not a dialysis candidate nor a candidate for advanced heart failure aggressive treatment.  Hospice for symptomatic management.  Renal will sign off, call with questions.  She does not need renal specific follow up either as we do not have anything to offer   Louis Meckel

## 2019-11-21 NOTE — Care Management Important Message (Signed)
Important Message  Patient Details  Name: LEATHA ROHNER MRN: [id number] Date of Birth: 27-Apr-1966   Medicare Important Message Given:  Yes     Shelda Altes 11/21/2019, 10:33 AM

## 2019-11-21 NOTE — Progress Notes (Signed)
PT Cancellation Note/DC from services.  Patient Details Name: KORTNIE STOVALL MRN: [id number] DOB: [birth year]   Cancelled Treatment:    Reason Eval/Treat Not Completed: Other (comment). Pt being transitioned home with hospice. Pt with no further acute PT needs at this time. Please re-consult if needed in future as PT SIGNING OFF.  Kittie Plater, PT, DPT Acute Rehabilitation Services Pager #: [phone] Office #: [phone]    Berline Lopes 11/21/2019, 9:26 AM

## 2019-11-21 NOTE — TOC Transition Note (Addendum)
Transition of Care [facility]) - CM/SW Discharge Note   Patient Details  Name: [name] MRN: [id number] Date of Birth: Mar 31, 1966  [facility] CM/SW Contact:  Zenon Mayo, RN Phone Number: 11/21/2019, 10:14 AM   Clinical Narrative:    Patient for dc home with hospice today, NCM spoke with [facility] with [facility] , she is checking on DME to make sure it has been delivered to home and checking to see what time [employer] respiratory therapist will be coming to set cpap settings prior to dc.  NCM will set up ptar transport when this has been done.  Velta Addison says the DME is at the home, the [employer] resp therp is to be here at 12 noon to do fitting and settings for cpap and the cpap is to go with her on the ambulance to home.  After resp sees her will call ptar.   [facility] has been called for transport to home   Final next level of care: Home w Hospice Care Barriers to Discharge: No Barriers Identified   Patient Goals and CMS Choice   CMS Medicare.gov Compare Post Acute Care list provided to:: Patient Represenative (must comment)(daughter Sherol Dade) Choice offered to / list presented to : Adult Children(daughter Sherol Dade)  Discharge Placement                       Discharge Plan and Services     Post Acute Care Choice: Hospice          DME Arranged: Walker rolling with seat DME Agency: [facility] Date DME Agency Contacted: 11/15/19 Time DME Agency Contacted: [phone] Representative spoke with at DME Agency: [place]: RN [place] Agency: Lonia Chimera) Date [place]: 11/19/19 Time [place]: 1300 Representative spoke with at [place]: [place] ([place]) Interventions     Readmission Risk Interventions Readmission Risk Prevention Plan 09/13/2019 09/13/2019  Transportation Screening Complete Complete  PCP or Specialist Appt within 5-7 Days Complete Complete  Home Care Screening Complete Complete   Medication Review (RN CM) Referral to Pharmacy Complete  Some recent data might be hidden

## 2019-11-21 NOTE — Progress Notes (Signed)
Spoke with NCM at 1240; all DME received and patient appropriate for d/c via [facility].   Patient dischraged with [facility] at 1600; AVS and DNR given to [facility].

## 2019-11-21 NOTE — Discharge Summary (Signed)
Physician Discharge Summary  [name] UVO:[id number] DOB: [birth year] DOA: 11/07/2019  PCP: Vonna Drafts, FNP  Admit date: 11/07/2019 Discharge date: 11/21/2019  Time spent: 45 minutes  Recommendations for Outpatient Follow-up:  1. Discharge home with hospice services for comfort focused care   Discharge Diagnoses:  Acute kidney injury on stage IV chronic kidney disease Hyperkalemia Uremia Acute on chronic systolic and diastolic CHF, EF 20 to 74% Moderate pulmonary hypertension Super morbid obesity OSA/OHS Type 2 diabetes mellitus Acute metabolic encephalopathy Chronic neck pain Chronic back pain Myoclonus Iron deficiency anemia Anemia of chronic disease   Palliative care by specialist   Goals of care, counseling/discussion DO NOT RESUSCITATE   Discharge Condition: Poor  Diet recommendation: Comfort feeds  Filed Weights   11/19/19 0500 11/20/19 0531 11/21/19 0501  Weight: (!) 238.6 kg (!) 239.4 kg 135.6 kg    History of present illness:  54 year old w/ super morbid obesity, BMI of 93 (>500lbs) with chronic systolic heart failure/NICM EF 35%, diabetes mellitus, morbid obesity, chronic kidney disease 3-4 was brought in for bilateral lower extremity edema and worsening back pain due to a fall from the stretcher 2 weeks ago.  On arrival to ED she was found to be massively fluid overloaded with anasarca, acute kidney injury and acute on chronic systolic heart failure  Hospital Course:  Acute on chronic stage IV kidney disease with hyperkalemia,  Acute on chronic systolic and diastolic CHF, EF 25% Anasarca Cardiorenal syndrome -Admitted with massive anasarca, acute kidney injury, creatinine was 3.6 on admission from baseline of 2 -Cardiology Dr.Patwardhan was consulted, started Diuretics -However did not respond so nephrology was quickly consulted, work-up did not show evidence of hydronephrosis, she had a Foley catheter placed throughout this admission -Felt to  be hemodynamically mediated, secondary to Cardiorenal syndrome -Continued to have significant volume overload with anasarca, was treated with very high doses of IV Lasix and metolazone per Renal this admission with no response, finally lasix stopped 3/6 -Creatinine 4.8 and BUN 111,  with no response to diuretics -Extremely difficult situation, repeat echocardiogram with EF of 20 to 25%, global hypokinesis, at least moderate pulmonary hypertension -Palliative care also consulted and following -Per Renal she is not a Dialysis candidate, there is no option for longterm dialysis in their kidney center for pts over 500lbs and especially given nonambulatory status -Case reviewed with patient's primary cardiologist Dr. Virgina Jock, she is not felt to be a candidate for advanced heart failure therapy, case was also discussed with Dr. Haroldine Laws from [place] team who agreed -Palliative consulted and followed the patient through this admission, finally decision was made for discharge home with hospice services  AKI on CKD stage 4  Hyperkalemia -As above  Acute respiratory failure with hypoxia  Acute on chronic systolic and diastolic CHF Super morbid obesity, OSA OHS -Diuretics as noted above -Echo repeat echo now with drop in EF to 20/25%, global hypokinesis and at least moderate pulmonary hypertension -Continued CPAP nightly and daytime naps  Acute  Metabolic Encephalopathy: Suspect from ESRD and uremia.  -Improved, but does have increased somnolence -Discontinued OxyContin  Super morbid obesity:  -BMI of 90.1  Myoclonic jerks with some tremors  -Likely secondary to uremia.  Gabapentin on hold since admission -OxyContin discontinued  Chronic neck, back pain Chronic narcotic dependence CT of the cervical spine was done on 2/23 and it shows 1. No acute osseous abnormality identified in the cervical spine. Solid C4-C7 anterior fusion. -CT of the lumbar spine on admission shows No  acute osseous  abnormality identified in the lumbar spine. Mid and lower lumbar disc degeneration and advanced facet arthrosis with chronic severe spinal stenosis at L3-4 and at least moderate spinal stenosis at L4-5. Severe left neural foraminal stenosis at L5-S1. -Discontinued OxyContin and buprenorphine patch, due to encephalopathy -decreased Percocet dose  Diabetes mellitus  -Hemoglobin A1c is 8.8  -CBG stable  Abnormal UA/possibly asymptomatic pyuria Completed 5 days of IV rocephin last week  Iron deficiency anemia  anemia of chronic disease from stage IV CKD -Given IV iron and EPO this  Odynophagia Pt reports severe GERD, and has a hard time swallowing , reports early satiety and some times have hard time when food passes down to the stomach:  Esophagogram noted only mild distal esophageal narrowing   Discharge Exam: Vitals:   11/21/19 0936 11/21/19 1115  BP: 125/85 119/64  Pulse: 85 79  Resp:  13  Temp:  98 F (36.7 C)  SpO2:  99%    General: Somnolent but arousable, oriented to self place, partly to time Cardiovascular: S1-S2, distant heart sounds Respiratory: Distant breath sounds anteriorly  Discharge Instructions   Discharge Instructions    Diet - low sodium heart healthy   Complete by: As directed    Diet - low sodium heart healthy   Complete by: As directed    Increase activity slowly   Complete by: As directed    Increase activity slowly   Complete by: As directed      Allergies as of 11/21/2019   No Known Allergies     Medication List    STOP taking these medications   allopurinol 300 MG tablet Commonly known as: ZYLOPRIM   amLODipine 5 MG tablet Commonly known as: NORVASC   buprenorphine 20 MCG/HR Ptwk patch Commonly known as: BUTRANS   gabapentin 100 MG capsule Commonly known as: NEURONTIN   linagliptin 5 MG Tabs tablet Commonly known as: TRADJENTA   Vitamin D (Ergocalciferol) 1.25 MG (50000 UNIT) Caps capsule Commonly known as: DRISDOL    Xtampza ER 36 MG C12a Generic drug: oxyCODONE ER     TAKE these medications   albuterol (2.5 MG/3ML) 0.083% nebulizer solution Commonly known as: PROVENTIL Take 2.5 mg by nebulization every 4 (four) hours as needed for wheezing.   ALPRAZolam 0.25 MG tablet Commonly known as: XANAX Take 0.25 mg by mouth 2 (two) times daily as needed for anxiety.   BiDil 20-37.5 MG tablet Generic drug: isosorbide-hydrALAZINE Take 1 tablet by mouth 2 times daily at 12 noon and 4 pm. What changed:   how much to take  when to take this   furosemide 40 MG tablet Commonly known as: Lasix Take 1 tablet (40 mg total) by mouth 2 (two) times daily.   metoprolol succinate 25 MG 24 hr tablet Commonly known as: TOPROL-XL TAKE 1 TABLET(25 MG) BY MOUTH DAILY WITH OR IMMEDIATELY FOLLOWING A MEAL What changed: See the new instructions.   Narcan 4 MG/0.1ML Liqd nasal spray kit Generic drug: naloxone Place 0.4 mg into the nose as needed (for overdose).   oxyCODONE-acetaminophen 10-325 MG tablet Commonly known as: PERCOCET Take 1 tablet by mouth every 8 (eight) hours as needed for pain. What changed: when to take this   Ozempic (0.25 or 0.5 MG/DOSE) 2 MG/1.5ML Sopn Generic drug: Semaglutide(0.25 or 0.5MG/DOS) Inject 0.25 mg into the skin every Wednesday.   senna-docusate 8.6-50 MG tablet Commonly known as: Senokot-S Take 2 tablets by mouth at bedtime as needed for mild constipation or moderate  constipation.            Durable Medical Equipment  (From admission, onward)         Start     Ordered   11/13/19 1000  For home use only DME continuous positive airway pressure (CPAP)  Once    Question Answer Comment  Length of Need Lifetime   Patient has OSA or probable OSA Yes   Settings Autotitration   CPAP supplies needed Mask, headgear, cushions, filters, heated tubing and water chamber      11/13/19 0959         No Known Allergies    The results of significant diagnostics from this  hospitalization (including imaging, microbiology, ancillary and laboratory) are listed below for reference.    Significant Diagnostic Studies: CT ABDOMEN PELVIS WO CONTRAST  Result Date: 11/08/2019 CLINICAL DATA:  [place] 2 weeks ago. Abdominal and back pain. Labored breathing. EXAM: CT ABDOMEN AND PELVIS WITHOUT CONTRAST TECHNIQUE: Multidetector CT imaging of the abdomen and pelvis was performed following the standard protocol without IV contrast. COMPARISON:  None. FINDINGS: Extremely limited examination due to body habitus. The patient's skin is touching the gantry and creating significant artifact. Study also limited by patient motion and lack of IV contrast. Lower chest: Breathing motion artifact. No obvious pulmonary infiltrates or effusions. The heart is mildly enlarged. No pericardial effusion. Hepatobiliary: No gross abnormalities identified. The gallbladder is surgically absent. Pancreas: No gross abnormality. Spleen: Within normal limits in size. No obvious lesions. Adrenals/Urinary Tract: Difficult to evaluate the adrenal glands. Both kidneys are grossly normal. No renal calculi or hydronephrosis. Limited examination of the bladder. Stomach/Bowel: The stomach, duodenum, small bowel and colon are grossly normal but examination is quite limited. No findings for small bowel obstruction or free air. Vascular/Lymphatic: The aorta is normal in caliber. No obvious atherosclerotic calcifications or aneurysm. Reproductive: I believe the uterus and ovaries are normal. Other: No pelvic mass or free pelvic fluid collections. Diffuse body wall edema. Musculoskeletal: No gross bony abnormalities but very limited examination. Advanced bilateral hip joint degenerative changes. IMPRESSION: 1. Very limited examination due to body habitus, motion artifact and lack of IV contrast. 2. No obvious/gross acute abdominal/pelvic findings, mass lesions or adenopathy. 3. Diffuse body wall edema. 4. Advanced bilateral hip joint  degenerative changes. Electronically Signed   By: Marijo Sanes M.D.   On: 11/08/2019 16:30   DG Chest 2 View  Result Date: 11/07/2019 CLINICAL DATA:  54 year old female with shortness of breath and chest pain. EXAM: CHEST - 2 VIEW COMPARISON:  Chest radiograph dated 10/20/2019. FINDINGS: There is cardiomegaly with vascular congestion. No focal consolidation, pleural effusion, pneumothorax. No acute osseous pathology. Cervical ACDF. IMPRESSION: Cardiomegaly with vascular congestion, progressed since the prior radiograph. No focal consolidation. Electronically Signed   By: Anner Crete M.D.   On: 11/07/2019 22:06   CT CERVICAL SPINE WO CONTRAST  Result Date: 11/08/2019 CLINICAL DATA:  Neck and low back pain after a fall 2 weeks ago. EXAM: CT CERVICAL SPINE WITHOUT CONTRAST TECHNIQUE: Multidetector CT imaging of the cervical spine was performed without intravenous contrast. Multiplanar CT image reconstructions were also generated. COMPARISON:  10/20/2019 FINDINGS: Alignment: Chronic cervical spine straightening. No acute traumatic subluxation. Skull base and vertebrae: No acute fracture or destructive osseous process. Previous C5 and C6 corpectomy and anterior fusion from C4-C7 with solid osseous fusion. Soft tissues and spinal canal: No prevertebral fluid or swelling. Poor assessment of the spinal canal due to large body habitus. Disc levels: C4-C7  anterior fusion. Bulky anterior vertebral spurring at C3. Moderate to severe disc space narrowing at C7-T1. Moderate multilevel facet arthrosis. Moderate bilateral neural foraminal stenosis at C3-4 due to uncovertebral and facet spurring. Severe left neural foraminal stenosis at C4-5. Upper chest: Clear lung apices. Other: None. IMPRESSION: 1. No acute osseous abnormality identified in the cervical spine. 2. Solid C4-C7 anterior fusion. Electronically Signed   By: Logan Bores M.D.   On: 11/08/2019 09:26   CT LUMBAR SPINE WO CONTRAST  Result Date:  11/08/2019 CLINICAL DATA:  Neck and low back pain after a fall 2 weeks ago. EXAM: CT LUMBAR SPINE WITHOUT CONTRAST TECHNIQUE: Multidetector CT imaging of the lumbar spine was performed without intravenous contrast administration. Multiplanar CT image reconstructions were also generated. COMPARISON:  Lumbar spine MRI 02/03/2010 FINDINGS: Image quality is degraded by large body habitus. Segmentation: 5 lumbar type vertebrae. Alignment: Mild lumbar levoscoliosis. Chronic facet mediated anterolisthesis of L4 on L5 measuring 4 mm. Vertebrae: No acute fracture or destructive osseous process. Paraspinal and other soft tissues: No acute finding. Disc levels: T10-11: Spondylosis and mild right and severe left facet arthrosis result in moderate to severe left neural foraminal stenosis without gross spinal stenosis, similar to the prior MRI. T11-12: Spondylosis and moderate facet arthrosis without gross stenosis, similar to the prior MRI. T12-L1: Mild-to-moderate facet hypertrophy without gross stenosis, similar to the prior MRI. L1-2: Moderate facet hypertrophy without gross stenosis, similar to the prior MRI. L2-3: Mild disc bulging and severe facet hypertrophy without gross stenosis, similar to the prior MRI. L3-4: Mild disc space narrowing and vacuum disc. Disc bulging and severe right greater than left facet hypertrophy result in chronic severe spinal stenosis and severe right neural foraminal stenosis, similar to the prior MRI. L4-5: Mild disc space narrowing and vacuum disc. Anterolisthesis with bulging uncovered disc and severe right greater than left facet hypertrophy result in at least moderate chronic spinal stenosis and mild-to-moderate bilateral neural foraminal stenosis, similar to the prior MRI. L5-S1: Severe asymmetric left-sided disc space narrowing, mildly progressed. Disc bulging and endplate spurring asymmetric to the left, asymmetric left-sided disc space height loss, and severe facet hypertrophy result in  mild right and severe left neural foraminal stenosis. Poor assessment for a persistent left subarticular disc protrusion left lateral recess stenosis shown on the prior MRI. IMPRESSION: 1. No acute osseous abnormality identified in the lumbar spine. 2. Mid and lower lumbar disc degeneration and advanced facet arthrosis with chronic severe spinal stenosis at L3-4 and at least moderate spinal stenosis at L4-5. 3. Severe left neural foraminal stenosis at L5-S1. Electronically Signed   By: Logan Bores M.D.   On: 11/08/2019 09:20   US RENAL  Result Date: 11/17/2019 CLINICAL DATA:  Acute kidney injury EXAM: RENAL / URINARY TRACT ULTRASOUND COMPLETE COMPARISON:  CT 11/08/2019, renal ultrasound 11/08/2019 FINDINGS: Right Kidney: Renal measurements: 10.2 x 5.6 x 4.1 cm = volume: 124 mL. Increased cortical echogenicity. No mass or hydronephrosis visualized. Left Kidney: Nonvisualization of the left kidney secondary to poor sonographic window and patient habitus. Bladder: Decompressed by Foley catheter.  Poorly assessed sonographically. Other: None. IMPRESSION: Technically difficult exam given patient body habitus and poor sonographic windows. Nonvisualization of the left kidney. Increased cortical echogenicity in the right kidney. Compatible with medical renal disease. Bladder decompressed by Foley catheter. Electronically Signed   By: Lovena Le M.D.   On: 11/17/2019 18:05   US RENAL  Result Date: 11/08/2019 CLINICAL DATA:  Acute renal injury. EXAM: RENAL / URINARY TRACT  ULTRASOUND COMPLETE COMPARISON:  No prior. FINDINGS: Right Kidney: Renal measurements: 9.9 x 5.0 x 5.9 cm = volume: 149.9 mL . Echogenicity within normal limits. No mass or hydronephrosis visualized. Lower pole not visualized due to overlying bowel gas. Left Kidney: Renal measurements: Not visualized Bladder: Bladder is nondistended. Other: Limited exam due to patient's body habitus and bowel gas. IMPRESSION: Limited exam due to patient's body  habitus and bowel gas. Left kidney not visualized. Lower pole right kidney not visualized. No acute or focal abnormality. No evidence of right hydronephrosis. Electronically Signed   By: Marcello Moores  Register   On: 11/08/2019 10:45   ECHOCARDIOGRAM COMPLETE  Result Date: 11/20/2019    ECHOCARDIOGRAM REPORT   Patient Name:   Ena Dawley Date of Exam: 11/20/2019 Medical Rec #:  [id number]        Height:       63.0 in Accession #:    [id number]       Weight:       527.8 lb Date of Birth:  [birth year]         BSA:          2.921 m Patient Age:    41 years         BP:           132/72 mmHg Patient Gender: F                HR:           101 bpm. Exam Location:  Inpatient Procedure: Intracardiac Opacification Agent Indications:    CHF-Acute Systolic 315.17 / O16.07  History:        Patient has prior history of Echocardiogram examinations, most                 recent 09/12/2019. CHF; Risk Factors:Diabetes, Hypertension,                 Sleep Apnea and Tobacco abuse. Chronic kidney disease                 Acute renal failure.  Sonographer:    Vikki Ports T[place]ine Referring Phys: [place]  1. The left ventricular internal cavity size was mildly dilated. Left ventricular ejection fraction, by estimation, is 20-25%. The left ventricle has severely decreased function. Severe global hypokinesis with abnormal septal wall motion due to LBBB. Left ventricular diastolic parameters are consistent with Grade II diastolic dysfunction (pseudonormalization)  2. Right ventricular systolic function is normal. The right ventricular size is normal.  3. Right atrial size was mildly dilated.  4. Aortic valve not well visualized. Mild aortic stenosis. Vmax 2.3 m/sec, mean PG 9 mmHg, AVA 2.3 cm2/m2.  5. Moderate tricuaspid regurgitation.  6. At least moderate pulmonary hypertension. Estimated PASP 60 mmHg.  7. Compared to previous study in 08/2019, LVEF has reduced from 35-40% to 20-25%. Filling pressures are elevated. FINDINGS   Left Ventricle: Left ventricular ejection fraction, by estimation, is <20%. The left ventricle has severely decreased function. The left ventricle demonstrates regional wall motion abnormalities. Definity contrast agent was given IV to delineate the left ventricular endocardial borders. The left ventricular internal cavity size was mildly dilated. There is no left ventricular hypertrophy. Abnormal (paradoxical) septal motion, consistent with left bundle branch block. Left ventricular diastolic parameters are consistent with Grade II diastolic dysfunction (pseudonormalization). Right Ventricle: The right ventricular size is normal. No increase in right ventricular wall thickness. Right ventricular systolic function is normal. There is moderately elevated pulmonary  artery systolic pressure. The tricuspid regurgitant velocity is 3.45 m/s, and with an assumed right atrial pressure of 15 mmHg, the estimated right ventricular systolic pressure is 31.4 mmHg. Left Atrium: Left atrial size was normal in size. Right Atrium: Right atrial size was mildly dilated. Pericardium: There is no evidence of pericardial effusion. Mitral Valve: The mitral valve is grossly normal. No evidence of mitral valve regurgitation. Tricuspid Valve: The tricuspid valve is grossly normal. Tricuspid valve regurgitation is moderate. Aortic Valve: Vmax 2.3 m/sec, mean PG 9 mmHg, AVA 2.3 cm2/m2. The aortic valve was not well visualized. Aortic valve regurgitation is not visualized. Mild aortic stenosis is present. Aortic valve mean gradient measures 9.0 mmHg. Aortic valve peak gradient measures 13.7 mmHg. Aortic valve area, by VTI measures 2.32 cm. Pulmonic Valve: The pulmonic valve was not well visualized. Pulmonic valve regurgitation Not well visualized. Aorta: The aortic root is normal in size and structure. Venous: The inferior vena cava is dilated in size with less than 50% respiratory variability, suggesting right atrial pressure of 15 mmHg.  IAS/Shunts: The interatrial septum was not assessed.  LEFT VENTRICLE PLAX 2D LVIDd:         5.60 cm  Diastology LVIDs:         4.15 cm  LV e' medial:   6.85 cm/s LV PW:         1.00 cm  LV E/e' medial: 17.8 LV IVS:        1.00 cm LVOT diam:     2.00 cm LV SV:         88 LV SV Index:   30 LVOT Area:     3.14 cm  RIGHT VENTRICLE RV S prime:     11.20 cm/s TAPSE (M-mode): 2.3 cm LEFT ATRIUM           Index       RIGHT ATRIUM           Index LA diam:      4.80 cm 1.64 cm/m  RA Area:     23.60 cm LA Vol (A4C): 59.2 ml 20.27 ml/m RA Volume:   78.30 ml  26.81 ml/m  AORTIC VALVE AV Area (Vmax):    2.85 cm AV Area (Vmean):   2.22 cm AV Area (VTI):     2.32 cm AV Vmax:           185.00 cm/s AV Vmean:          146.000 cm/s AV VTI:            0.381 m AV Peak Grad:      13.7 mmHg AV Mean Grad:      9.0 mmHg LVOT Vmax:         168.00 cm/s LVOT Vmean:        103.000 cm/s LVOT VTI:          0.281 m LVOT/AV VTI ratio: 0.74  AORTA Ao Root diam: 2.50 cm MITRAL VALVE                TRICUSPID VALVE MV Area (PHT): 6.71 cm     TR Peak grad:   47.6 mmHg MV Decel Time: 113 msec     TR Vmax:        345.00 cm/s MV E velocity: 122.00 cm/s MV A velocity: 119.00 cm/s  SHUNTS MV E/A ratio:  1.03         Systemic VTI:  0.28 m  Systemic Diam: 2.00 cm Vernell Leep MD Electronically signed by Vernell Leep MD Signature Date/Time: 11/20/2019/11:19:52 AM    Final    DG ESOPHAGUS W SINGLE CM (SOL OR THIN BA)  Result Date: 11/16/2019 CLINICAL DATA:  Chronic reflux. Abdominal distension. EXAM: ESOPHOGRAM/BARIUM SWALLOW TECHNIQUE: Single contrast examination was performed using  thin barium. FLUOROSCOPY TIME:  Fluoroscopy Time: 54 seconds of low-dose pulsed fluoroscopy Radiation Exposure Index (if provided by the fluoroscopic device): 26.2 mGy Number of Acquired Spot Images: 0 COMPARISON:  Chest CT 03/04/2011. Abdominal CT 11/08/2019. FINDINGS: Study is limited by the patient's body habitus and limited  mobility. Examination was performed in the supine and semi erect positions. The foot board would not support the patient's weight for erect imaging. The esophageal motility appears normal. No abnormality of the cervical esophagus identified. There is no evidence aspiration. Patient is status post cervical fusion. No focal abnormality of the thoracic esophagus identified. There is mild luminal narrowing at the gastroesophageal junction. A 13 mm barium tablet was administered and passed rapidly into the distal esophagus. However, despite multiple swallows of water and thin barium, the tablet did not pass through the gastroesophageal junction with the patient in the semi erect position. No gross narrowing of the esophageal lumen at level of retention identified. IMPRESSION: 1. Limited examination. 2. Mild luminal narrowing at the gastroesophageal junction without mucosal irregularity. A 13 mm barium tablet did not pass through the distal esophagus with the patient in the semi erect position. This may be related to the patient's body habitus and limited mobility. Electronically Signed   By: Richardean Sale M.D.   On: 11/16/2019 11:46    Microbiology: No results found for this or any previous visit (from the past 240 hour(s)).   Labs: Basic Metabolic Panel: Recent Labs  Lab 11/17/19 0240 11/18/19 0322 11/19/19 0522 11/20/19 0339 11/21/19 0416  NA 135 133* 133* 135 136  K 3.6 3.6 3.8 4.1 4.1  CL 95* 95* 93* 96* 97*  CO2 _0 GLUCOSE 143* 122* 117* 104* 118*  BUN 99* 104* 109* 111* 112*  CREATININE 4.56* 4.81* 4.99* 4.83* 4.64*  CALCIUM 8.7* 8.5* 8.6* 8.6* 8.7*  PHOS 5.6* 5.7* 6.0* 5.7* 5.9*   Liver Function Tests: Recent Labs  Lab 11/17/19 0240 11/18/19 0322 11/19/19 0522 11/20/19 0339 11/21/19 0416  ALBUMIN 3.0* 2.9* 3.0* 2.9* 3.1*   No results for input(s): LIPASE, AMYLASE in the last 168 hours. No results for input(s): AMMONIA in the last 168 hours. CBC: Recent Labs   Lab 11/15/19 1225 11/17/19 0240 11/18/19 0322  WBC 5.0 5.5 5.3  NEUTROABS 3.3  --   --   HGB 9.6* 9.4* 9.0*  HCT 31.8* 31.2* 29.8*  MCV 90.6 90.7 91.4  PLT 166 163 160   Cardiac Enzymes: No results for input(s): CKTOTAL, CKMB, CKMBINDEX, TROPONINI in the last 168 hours. BNP: BNP (last 3 results) Recent Labs    09/10/19 2341 10/15/19 2252 11/07/19 2145  BNP 313.1* 562.8* 584.6*    ProBNP (last 3 results) No results for input(s): PROBNP in the last 8760 hours.  CBG: Recent Labs  Lab 11/20/19 1128 11/20/19 1648 11/20/19 2123 11/21/19 0620 11/21/19 1106  GLUCAP 116* 111* 125* 117* 140*       Signed:  Domenic Polite MD.  [facility] 11/21/2019, 11:33 AM

## 2019-11-30 DIAGNOSIS — N186 End stage renal disease: Secondary | ICD-10-CM | POA: Diagnosis not present

## 2019-12-12 ENCOUNTER — Other Ambulatory Visit: Payer: Self-pay | Admitting: Cardiology

## 2019-12-18 ENCOUNTER — Encounter (HOSPITAL_COMMUNITY): Payer: Self-pay | Admitting: *Deleted

## 2019-12-18 ENCOUNTER — Other Ambulatory Visit: Payer: Self-pay | Admitting: Cardiology

## 2019-12-18 ENCOUNTER — Emergency Department (HOSPITAL_COMMUNITY)
Admission: EM | Admit: 2019-12-18 | Discharge: 2019-12-18 | Disposition: A | Discharge status: Home / Self Care | Attending: Emergency Medicine | Admitting: Emergency Medicine

## 2019-12-18 ENCOUNTER — Emergency Department (HOSPITAL_COMMUNITY)

## 2019-12-18 ENCOUNTER — Other Ambulatory Visit: Payer: Self-pay

## 2019-12-18 DIAGNOSIS — R079 Chest pain, unspecified: Secondary | ICD-10-CM | POA: Diagnosis not present

## 2019-12-18 DIAGNOSIS — R609 Edema, unspecified: Secondary | ICD-10-CM | POA: Diagnosis not present

## 2019-12-18 DIAGNOSIS — R0789 Other chest pain: Secondary | ICD-10-CM | POA: Diagnosis not present

## 2019-12-18 DIAGNOSIS — Z79899 Other long term (current) drug therapy: Secondary | ICD-10-CM | POA: Insufficient documentation

## 2019-12-18 DIAGNOSIS — M2550 Pain in unspecified joint: Secondary | ICD-10-CM | POA: Diagnosis not present

## 2019-12-18 DIAGNOSIS — Z7401 Bed confinement status: Secondary | ICD-10-CM | POA: Diagnosis not present

## 2019-12-18 DIAGNOSIS — I5043 Acute on chronic combined systolic (congestive) and diastolic (congestive) heart failure: Secondary | ICD-10-CM | POA: Insufficient documentation

## 2019-12-18 DIAGNOSIS — I471 Supraventricular tachycardia: Secondary | ICD-10-CM | POA: Diagnosis not present

## 2019-12-18 DIAGNOSIS — I209 Angina pectoris, unspecified: Secondary | ICD-10-CM | POA: Diagnosis not present

## 2019-12-18 DIAGNOSIS — R0902 Hypoxemia: Secondary | ICD-10-CM | POA: Diagnosis not present

## 2019-12-18 DIAGNOSIS — N289 Disorder of kidney and ureter, unspecified: Secondary | ICD-10-CM | POA: Insufficient documentation

## 2019-12-18 DIAGNOSIS — E1122 Type 2 diabetes mellitus with diabetic chronic kidney disease: Secondary | ICD-10-CM | POA: Diagnosis not present

## 2019-12-18 DIAGNOSIS — F1210 Cannabis abuse, uncomplicated: Secondary | ICD-10-CM | POA: Diagnosis not present

## 2019-12-18 DIAGNOSIS — R000 Tachycardia, unspecified: Secondary | ICD-10-CM | POA: Diagnosis not present

## 2019-12-18 DIAGNOSIS — F17210 Nicotine dependence, cigarettes, uncomplicated: Secondary | ICD-10-CM | POA: Insufficient documentation

## 2019-12-18 DIAGNOSIS — I499 Cardiac arrhythmia, unspecified: Secondary | ICD-10-CM | POA: Diagnosis not present

## 2019-12-18 DIAGNOSIS — I130 Hypertensive heart and chronic kidney disease with heart failure and stage 1 through stage 4 chronic kidney disease, or unspecified chronic kidney disease: Secondary | ICD-10-CM | POA: Insufficient documentation

## 2019-12-18 DIAGNOSIS — R600 Localized edema: Secondary | ICD-10-CM | POA: Diagnosis not present

## 2019-12-18 DIAGNOSIS — N183 Chronic kidney disease, stage 3 unspecified: Secondary | ICD-10-CM | POA: Insufficient documentation

## 2019-12-18 DIAGNOSIS — Z743 Need for continuous supervision: Secondary | ICD-10-CM | POA: Diagnosis not present

## 2019-12-18 LAB — CBC WITH DIFFERENTIAL/PLATELET
Abs Immature Granulocytes: 0.04 10*3/uL (ref 0.00–0.07)
Basophils Absolute: 0 10*3/uL (ref 0.0–0.1)
Basophils Relative: 0 %
Eosinophils Absolute: 0 10*3/uL (ref 0.0–0.5)
Eosinophils Relative: 0 %
HCT: 32.9 % — ABNORMAL LOW (ref 36.0–46.0)
Hemoglobin: 9.8 g/dL — ABNORMAL LOW (ref 12.0–15.0)
Immature Granulocytes: 1 %
Lymphocytes Relative: 12 %
Lymphs Abs: 0.7 10*3/uL (ref 0.7–4.0)
MCH: 28.6 pg (ref 26.0–34.0)
MCHC: 29.8 g/dL — ABNORMAL LOW (ref 30.0–36.0)
MCV: 95.9 fL (ref 80.0–100.0)
Monocytes Absolute: 0.8 10*3/uL (ref 0.1–1.0)
Monocytes Relative: 15 %
Neutro Abs: 3.9 10*3/uL (ref 1.7–7.7)
Neutrophils Relative %: 72 %
Platelets: 155 10*3/uL (ref 150–400)
RBC: 3.43 MIL/uL — ABNORMAL LOW (ref 3.87–5.11)
RDW: 22.4 % — ABNORMAL HIGH (ref 11.5–15.5)
WBC: 5.5 10*3/uL (ref 4.0–10.5)
nRBC: 0 % (ref 0.0–0.2)

## 2019-12-18 LAB — BASIC METABOLIC PANEL
Anion gap: 15 (ref 5–15)
BUN: 68 mg/dL — ABNORMAL HIGH (ref 6–20)
CO2: 26 mmol/L (ref 22–32)
Calcium: 9.1 mg/dL (ref 8.9–10.3)
Chloride: 98 mmol/L (ref 98–111)
Creatinine, Ser: 3.08 mg/dL — ABNORMAL HIGH (ref 0.44–1.00)
GFR calc Af Amer: 19 mL/min — ABNORMAL LOW (ref >60)
GFR calc non Af Amer: 17 mL/min — ABNORMAL LOW (ref >60)
Glucose, Bld: 191 mg/dL — ABNORMAL HIGH (ref 70–99)
Potassium: 5 mmol/L (ref 3.5–5.1)
Sodium: 139 mmol/L (ref 135–145)

## 2019-12-18 LAB — TROPONIN I (HIGH SENSITIVITY)
Troponin I (High Sensitivity): 33 ng/L — ABNORMAL HIGH (ref <18)
Troponin I (High Sensitivity): 39 ng/L — ABNORMAL HIGH (ref <18)

## 2019-12-18 LAB — MAGNESIUM: Magnesium: 1.7 mg/dL (ref 1.7–2.4)

## 2019-12-18 MED ORDER — ASPIRIN 81 MG PO CHEW
324.0000 mg | CHEWABLE_TABLET | Freq: Once | ORAL | Status: AC
  Administered 2019-12-18: 324 mg via ORAL
  Filled 2019-12-18: qty 4

## 2019-12-18 NOTE — ED Provider Notes (Signed)
[facility] EMERGENCY DEPARTMENT Provider Note   CSN: [id number] Arrival date & time: 12/18/19  4580   History Chief Complaint  Patient presents with  . Chest Pain    [name] is a 54 y.o. female.  The history is provided by the patient and the EMS personnel.  Chest Pain She has history of hypertension, diabetes, hyperlipidemia, congestive heart failure, chronic kidney disease and is brought in by ambulance because of chest pain and difficulty breathing.  Symptoms started at about 11 PM while at rest.  She describes a heavy, pressure feeling in the mid chest with associated dyspnea and nausea but no vomiting and no diaphoresis.  She did notice palpitations.  EMS arrived and noted patient in a wide-complex tachycardia which they felt was ventricular tachycardia.  She was given amiodarone followed by adenosine 6 mg followed by adenosine 12 mg.  Following this, she converted, and symptoms have improved greatly.  Past Medical History:  Diagnosis Date  . Arthritis    "hands, arms, feet, back, knees" (02/17/2018)  . Cardiomyopathy [facility])    From Dr. Bonney Roussel office notes from 01/13/18  . CHF (congestive heart failure) ([place])   . Chronic lower back pain   . Diabetic neuropathy ([place])   . Family history of adverse reaction to anesthesia    Mother is hard to arouse and blood pressure drops  . GERD (gastroesophageal reflux disease)   . High cholesterol   . Hypertension   . Sleep apnea    "need new machine"  - does not use a cpap (02/17/2018)  . Type II diabetes mellitus ([place])    no longer on medications (02/17/2018)    Patient Active Problem List   Diagnosis Date Noted  . Palliative care by specialist   . Goals of care, counseling/discussion   . Hyperkalemia 11/08/2019  . ARF (acute renal failure) ([place]) 11/07/2019  . Acute on chronic diastolic CHF (congestive heart failure) ([place]) 10/16/2019  . CKD (chronic kidney disease), stage III 10/16/2019  . Acute  exacerbation of CHF (congestive heart failure) ([place]) 10/16/2019  . Acute on chronic systolic congestive heart failure ([place]) 09/11/2019  . AKI (acute kidney injury) ([place]) 09/11/2019  . Chronic pain syndrome 09/11/2019  . Heart failure ([place]) 09/11/2019  . No-show for appointment 07/08/2019  . Chronic systolic heart failure ([place]) 04/26/2019  . S/P revision of total knee, right 05/14/2018  . Dislocated knee, right, sequela   . S/P revision of total knee 02/17/2018  . Failed total right knee replacement ([place])   . Chronic renal insufficiency, stage 2 (mild) 11/10/2017  . Acute CHF (congestive heart failure) ([place]) 11/10/2017  . Failed total knee arthroplasty, sequela 08/31/2017  . Lower extremity cellulitis 01/03/2013  . Diabetes mellitus ([place]) 01/03/2013  . Morbid obesity ([place]) 11/12/2006  . TOBACCO DEPENDENCE 11/12/2006  . Depression 11/12/2006  . Essential hypertension 11/12/2006  . GASTROESOPHAGEAL REFLUX, NO ESOPHAGITIS 11/12/2006  . ACNE 11/12/2006  . OSTEOARTHRITIS, LOWER LEG 11/12/2006  . OSA (obstructive sleep apnea) 11/12/2006    Past Surgical History:  Procedure Laterality Date  . ANTERIOR CERVICAL DECOMP/DISCECTOMY FUSION  03/06/2010   C4-7 w/corpectomy/notes 03/17/2010  . BACK SURGERY    . CARPAL TUNNEL RELEASE Right   . HARDWARE REVISION  04/10/2010   cervical hardware revision screw replacement C4/notes 04/12/2010  . JOINT REPLACEMENT    . LAPAROSCOPIC CHOLECYSTECTOMY  1999  . LUMBAR LAMINECTOMY/DECOMPRESSION MICRODISCECTOMY  09/2010   Archie Endo 10/05/2010  . MULTIPLE EXTRACTIONS WITH ALVEOLOPLASTY [name] 01/19/2015  Procedure: MULTIPLE EXTRACTIONS Three, Four, Five, Seven, eight, nine, ten, eleven, twelve, fourteen, eighteen, twenty-one, twenty-eight, twenty-nine, thirty-one with Alveoloplasty.  ;  Surgeon: Diona Browner, DDS;  Location: [place];  Service: Oral Surgery;  Laterality: [name];  . REPLACEMENT TOTAL KNEE BILATERAL Bilateral 10/2007-03/2008   "left-right"  . TOENAIL  EXCISION Bilateral    great toes  . TONSILLECTOMY    . TOTAL KNEE REVISION Right 02/17/2018  . TOTAL KNEE REVISION Right 02/17/2018   Procedure: RIGHT TOTAL KNEE REVISION;  Surgeon: Newt Minion, MD;  Location: [place];  Service: Orthopedics;  Laterality: Right;  . TOTAL KNEE REVISION Right 05/14/2018   Procedure: RIGHT TOTAL KNEE REVISION TO HINGED KNEE;  Surgeon: Newt Minion, MD;  Location: [place];  Service: Orthopedics;  Laterality: Right;     OB History   No obstetric history on file.     Family History  Problem Relation Age of Onset  . Kidney disease Mother   . Congestive Heart Failure Mother   . Hypertension Father     Social History   Tobacco Use  . Smoking status: Current Every Day Smoker    Packs/day: 0.50    Years: 32.00    Pack years: 16.00    Types: Cigarettes  . Smokeless tobacco: Never Used  Substance Use Topics  . Alcohol use: Not Currently  . Drug use: Not Currently    Types: Marijuana    Home Medications Prior to Admission medications   Medication Sig Start Date End Date Taking? Authorizing Provider  albuterol (PROVENTIL) (2.5 MG/3ML) 0.083% nebulizer solution Take 2.5 mg by nebulization every 4 (four) hours as needed for wheezing.  08/22/19   [provider]  ALPRAZolam Duanne Moron) 0.25 MG tablet Take 0.25 mg by mouth 2 (two) times daily as needed for anxiety.  02/03/19   [provider]  furosemide (LASIX) 40 MG tablet Take 1 tablet (40 mg total) by mouth 2 (two) times daily. 09/19/19 09/18/20  Geradine Girt, DO  isosorbide-hydrALAZINE (BIDIL) 20-37.5 MG tablet Take 1 tablet by mouth 2 times daily at 12 noon and 4 pm. 11/19/19   Domenic Polite, MD  metoprolol succinate (TOPROL-XL) 25 MG 24 hr tablet TAKE 1 TABLET(25 MG) BY MOUTH DAILY WITH OR IMMEDIATELY FOLLOWING A MEAL 12/12/19   Miquel Dunn, NP  [facility] 4 MG/0.1ML LIQD nasal spray kit Place 0.4 mg into the nose as needed (for overdose).  12/28/18   [provider]   oxyCODONE-acetaminophen (PERCOCET) 10-325 MG tablet Take 1 tablet by mouth every 8 (eight) hours as needed for pain. Patient taking differently: Take 1 tablet by mouth in the morning, at noon, in the evening, and at bedtime.  09/30/18   Rayburn, Neta Mends, PA-C  OZEMPIC, 0.25 OR 0.5 MG/DOSE, 2 MG/1.5ML SOPN Inject 0.25 mg into the skin every Wednesday. 10/26/19   [provider]  senna-docusate (SENOKOT-S) 8.6-50 MG tablet Take 2 tablets by mouth at bedtime as needed for mild constipation or moderate constipation. 11/19/19   Domenic Polite, MD    Allergies    Patient has no known allergies.  Review of Systems   Review of Systems  Cardiovascular: Positive for chest pain.  All other systems reviewed and are negative.   Physical Exam Updated Vital Signs BP (!) 117/102   Pulse 100   Resp 17   Ht _0  (1.6 m)   Wt 135.6 kg   LMP 11/09/2012   SpO2 99%   BMI 52.96 kg/m   Physical  Exam Vitals and nursing note reviewed.   Morbidly obese 54 year old female, resting comfortably and in no acute distress. Vital signs are normal. Oxygen saturation is 99%, which is normal. Head is normocephalic and atraumatic. PERRLA, EOMI. Oropharynx is clear. Neck is nontender and supple without adenopathy or JVD. Back is nontender and there is no CVA tenderness. Lungs are clear without rales, wheezes, or rhonchi. Chest is nontender. Heart has regular rate and rhythm without murmur. Abdomen is soft, flat, nontender without masses or hepatosplenomegaly and peristalsis is normoactive. Extremities have 3+ edema, full range of motion is present. Skin is warm and dry without rash. Neurologic: Mental status is normal, cranial nerves are intact, there are no motor or sensory deficits.  ED Results / Procedures / Treatments   Labs (all labs ordered are listed, but only abnormal results are displayed) Labs Reviewed  BASIC METABOLIC PANEL  CBC WITH DIFFERENTIAL/PLATELET  MAGNESIUM  TROPONIN I  (HIGH SENSITIVITY)    EKG EKG Interpretation  Date/Time:  Sunday December 18 2019 06:54:31 EDT Ventricular Rate:  98 PR Interval:    QRS Duration: 170 QT Interval:  425 QTC Calculation: 543 R Axis:   -131 Text Interpretation: Sinus rhythm Nonspecific intraventricular conduction delay QT prolonged When compared with ECG of 2/22/20212, QT has lengthened Confirmed by Delora Fuel ([id number]) on 12/18/2019 6:57:33 AM   Radiology No results found.  Procedures Procedures  CRITICAL CARE Performed by: Delora Fuel Total critical care time: 40 minutes Critical care time was exclusive of separately billable procedures and treating other patients. Critical care was necessary to treat or prevent imminent or life-threatening deterioration. Critical care was time spent personally by me on the following activities: development of treatment plan with patient and/or surrogate as well as nursing, discussions with consultants, evaluation of patient's response to treatment, examination of patient, obtaining history from patient or surrogate, ordering and performing treatments and interventions, ordering and review of laboratory studies, ordering and review of radiographic studies, pulse oximetry and re-evaluation of patient's condition.  Medications Ordered in ED Medications - No data to display  ED Course  I have reviewed the triage vital signs and the nursing notes.  Pertinent labs & imaging results that were available during my care of the patient were reviewed by me and considered in my medical decision making (see chart for details).  Chest pain and palpitations.  I have reviewed EMS ECGs, and patient was in a wide-complex tachycardia 150bpm, but following conversion, QRS morphology is same as it was prior to conversion.  Apparently, she was in a paroxysmal supraventricular tachycardia rather than ventricular tachycardia.  However, because of prolonged length of time that she was having chest discomfort  with tachycardia, will check troponin levels.  ECG done here shows wide QRS which probably is an atypical left bundle branch block unchanged from prior ECG with the exception of QTc interval increased.  Will check magnesium level as well as electrolytes.  Old records were reviewed confirming history of CHF and renal insufficiency, no prior visits for angina or arrhythmia.  Echocardiogram 1 month ago showed ejection fraction 20-25%.  She is given aspirin.  Case is signed out to Dr.Ray.  MDM Rules/Calculators/A&P  Final Clinical Impression(s) / ED Diagnoses Final diagnoses:  SVT (supraventricular tachycardia) ([place])  Angina pectoris, unspecified ([place])  Peripheral edema  Renal insufficiency    Rx / DC Orders ED Discharge Orders    None       Delora Fuel, MD 22/57/50 0730

## 2019-12-18 NOTE — ED Notes (Signed)
PTAR CALLED  °

## 2019-12-18 NOTE — ED Triage Notes (Signed)
The pt arrived by [facility]  From [place] she lives with her family chest pain  Found by ems to vt  They gave the pt amiodarone 150mg  and  adeocard 6  And then 12  With conversion to nsr  Ems placed bi-lateral 16 angiocaths  Lt and rt a-cs   On arrival the pt alert and oriented   Skin warm and dry no distress

## 2019-12-18 NOTE — ED Notes (Signed)
[facility] here to transport [name] [name] called her daughter to notify that she is on her way. No further questions at this time

## 2019-12-18 NOTE — Discharge Instructions (Addendum)
Your creatinine today is 3.08 which is improved from last creatinine of 4.64

## 2019-12-18 NOTE — ED Provider Notes (Signed)
  Physical Exam  BP (!) 117/102   Pulse 100   Resp 17   Wt 135.6 kg   LMP 11/09/2012   SpO2 99%   BMI 52.96 kg/m   Physical Exam  ED Course/Procedures     Procedures  MDM  54 yo female ho esrd, volume overload, not currently on dialysis presents today with svt with aberrant conduction who was converted prehospital with adenosine.  EMS also gave amiodarone base on protocol due to wide complex tachycardia, however after conversion patient with continued wide qrs c.w. aberrant conduction. Plan trend troponins, review labs, and may d/c of normal Last admission reviewed and noted that on March 7, patient agreed to be DNR and was discharged home on hospice care Review of troponins were 30 and 39.  This would be consistent with the SVT but does not seem to have acute cardiac ischemia Creatinine is 3.08 today which continues to be improving from her last creatinine of 4.64. I discussed the results with the patient she appears stable for discharge.    Pattricia Boss, MD 12/18/19 1037

## 2019-12-21 DIAGNOSIS — J42 Unspecified chronic bronchitis: Secondary | ICD-10-CM | POA: Diagnosis not present

## 2019-12-28 ENCOUNTER — Emergency Department (HOSPITAL_COMMUNITY)

## 2019-12-28 ENCOUNTER — Other Ambulatory Visit: Payer: Self-pay

## 2019-12-28 ENCOUNTER — Inpatient Hospital Stay (HOSPITAL_COMMUNITY)
Admission: EM | Admit: 2019-12-28 | Discharge: 2019-12-31 | DRG: 698 | Disposition: A | Discharge status: Hospice | Attending: Internal Medicine | Admitting: Internal Medicine

## 2019-12-28 ENCOUNTER — Encounter (HOSPITAL_COMMUNITY): Payer: Self-pay

## 2019-12-28 DIAGNOSIS — I10 Essential (primary) hypertension: Secondary | ICD-10-CM | POA: Diagnosis not present

## 2019-12-28 DIAGNOSIS — Z743 Need for continuous supervision: Secondary | ICD-10-CM | POA: Diagnosis not present

## 2019-12-28 DIAGNOSIS — I428 Other cardiomyopathies: Secondary | ICD-10-CM | POA: Diagnosis present

## 2019-12-28 DIAGNOSIS — J9611 Chronic respiratory failure with hypoxia: Secondary | ICD-10-CM

## 2019-12-28 DIAGNOSIS — Z9981 Dependence on supplemental oxygen: Secondary | ICD-10-CM | POA: Diagnosis not present

## 2019-12-28 DIAGNOSIS — I5023 Acute on chronic systolic (congestive) heart failure: Secondary | ICD-10-CM | POA: Diagnosis not present

## 2019-12-28 DIAGNOSIS — R6520 Severe sepsis without septic shock: Secondary | ICD-10-CM | POA: Diagnosis not present

## 2019-12-28 DIAGNOSIS — G4733 Obstructive sleep apnea (adult) (pediatric): Secondary | ICD-10-CM | POA: Diagnosis not present

## 2019-12-28 DIAGNOSIS — I5043 Acute on chronic combined systolic (congestive) and diastolic (congestive) heart failure: Secondary | ICD-10-CM | POA: Diagnosis not present

## 2019-12-28 DIAGNOSIS — I509 Heart failure, unspecified: Secondary | ICD-10-CM | POA: Diagnosis not present

## 2019-12-28 DIAGNOSIS — E662 Morbid (severe) obesity with alveolar hypoventilation: Secondary | ICD-10-CM | POA: Diagnosis present

## 2019-12-28 DIAGNOSIS — R000 Tachycardia, unspecified: Secondary | ICD-10-CM | POA: Diagnosis not present

## 2019-12-28 DIAGNOSIS — J9621 Acute and chronic respiratory failure with hypoxia: Secondary | ICD-10-CM | POA: Diagnosis not present

## 2019-12-28 DIAGNOSIS — N390 Urinary tract infection, site not specified: Secondary | ICD-10-CM | POA: Diagnosis not present

## 2019-12-28 DIAGNOSIS — Z66 Do not resuscitate: Secondary | ICD-10-CM | POA: Diagnosis present

## 2019-12-28 DIAGNOSIS — Z20822 Contact with and (suspected) exposure to covid-19: Secondary | ICD-10-CM | POA: Diagnosis present

## 2019-12-28 DIAGNOSIS — R6510 Systemic inflammatory response syndrome (SIRS) of non-infectious origin without acute organ dysfunction: Secondary | ICD-10-CM

## 2019-12-28 DIAGNOSIS — I5082 Biventricular heart failure: Secondary | ICD-10-CM | POA: Diagnosis present

## 2019-12-28 DIAGNOSIS — K219 Gastro-esophageal reflux disease without esophagitis: Secondary | ICD-10-CM | POA: Diagnosis present

## 2019-12-28 DIAGNOSIS — E1140 Type 2 diabetes mellitus with diabetic neuropathy, unspecified: Secondary | ICD-10-CM | POA: Diagnosis not present

## 2019-12-28 DIAGNOSIS — N184 Chronic kidney disease, stage 4 (severe): Secondary | ICD-10-CM | POA: Diagnosis not present

## 2019-12-28 DIAGNOSIS — F17210 Nicotine dependence, cigarettes, uncomplicated: Secondary | ICD-10-CM | POA: Diagnosis present

## 2019-12-28 DIAGNOSIS — A419 Sepsis, unspecified organism: Secondary | ICD-10-CM

## 2019-12-28 DIAGNOSIS — T83518A Infection and inflammatory reaction due to other urinary catheter, initial encounter: Principal | ICD-10-CM | POA: Diagnosis present

## 2019-12-28 DIAGNOSIS — M545 Low back pain: Secondary | ICD-10-CM | POA: Diagnosis present

## 2019-12-28 DIAGNOSIS — I471 Supraventricular tachycardia: Secondary | ICD-10-CM | POA: Diagnosis present

## 2019-12-28 DIAGNOSIS — R5381 Other malaise: Secondary | ICD-10-CM | POA: Diagnosis not present

## 2019-12-28 DIAGNOSIS — R778 Other specified abnormalities of plasma proteins: Secondary | ICD-10-CM

## 2019-12-28 DIAGNOSIS — E1122 Type 2 diabetes mellitus with diabetic chronic kidney disease: Secondary | ICD-10-CM | POA: Diagnosis not present

## 2019-12-28 DIAGNOSIS — E119 Type 2 diabetes mellitus without complications: Secondary | ICD-10-CM

## 2019-12-28 DIAGNOSIS — J9622 Acute and chronic respiratory failure with hypercapnia: Secondary | ICD-10-CM | POA: Diagnosis present

## 2019-12-28 DIAGNOSIS — E782 Mixed hyperlipidemia: Secondary | ICD-10-CM | POA: Diagnosis present

## 2019-12-28 DIAGNOSIS — R531 Weakness: Secondary | ICD-10-CM | POA: Diagnosis not present

## 2019-12-28 DIAGNOSIS — Z6841 Body Mass Index (BMI) 40.0 and over, adult: Secondary | ICD-10-CM

## 2019-12-28 DIAGNOSIS — I248 Other forms of acute ischemic heart disease: Secondary | ICD-10-CM | POA: Diagnosis not present

## 2019-12-28 DIAGNOSIS — N182 Chronic kidney disease, stage 2 (mild): Secondary | ICD-10-CM | POA: Diagnosis not present

## 2019-12-28 DIAGNOSIS — R52 Pain, unspecified: Secondary | ICD-10-CM

## 2019-12-28 DIAGNOSIS — I130 Hypertensive heart and chronic kidney disease with heart failure and stage 1 through stage 4 chronic kidney disease, or unspecified chronic kidney disease: Secondary | ICD-10-CM | POA: Diagnosis not present

## 2019-12-28 DIAGNOSIS — G894 Chronic pain syndrome: Secondary | ICD-10-CM | POA: Diagnosis present

## 2019-12-28 DIAGNOSIS — F1120 Opioid dependence, uncomplicated: Secondary | ICD-10-CM | POA: Diagnosis present

## 2019-12-28 DIAGNOSIS — I452 Bifascicular block: Secondary | ICD-10-CM | POA: Diagnosis not present

## 2019-12-28 DIAGNOSIS — Y846 Urinary catheterization as the cause of abnormal reaction of the patient, or of later complication, without mention of misadventure at the time of the procedure: Secondary | ICD-10-CM | POA: Diagnosis present

## 2019-12-28 DIAGNOSIS — Z8249 Family history of ischemic heart disease and other diseases of the circulatory system: Secondary | ICD-10-CM

## 2019-12-28 DIAGNOSIS — Z841 Family history of disorders of kidney and ureter: Secondary | ICD-10-CM

## 2019-12-28 DIAGNOSIS — A4152 Sepsis due to Pseudomonas: Secondary | ICD-10-CM | POA: Diagnosis not present

## 2019-12-28 DIAGNOSIS — R509 Fever, unspecified: Secondary | ICD-10-CM | POA: Diagnosis not present

## 2019-12-28 DIAGNOSIS — Z981 Arthrodesis status: Secondary | ICD-10-CM

## 2019-12-28 DIAGNOSIS — Z96653 Presence of artificial knee joint, bilateral: Secondary | ICD-10-CM | POA: Diagnosis present

## 2019-12-28 DIAGNOSIS — Z7401 Bed confinement status: Secondary | ICD-10-CM | POA: Diagnosis not present

## 2019-12-28 DIAGNOSIS — Z79899 Other long term (current) drug therapy: Secondary | ICD-10-CM

## 2019-12-28 LAB — URINALYSIS, ROUTINE W REFLEX MICROSCOPIC
Bilirubin Urine: NEGATIVE
Glucose, UA: NEGATIVE mg/dL
Ketones, ur: NEGATIVE mg/dL
Nitrite: NEGATIVE
Protein, ur: NEGATIVE mg/dL
Specific Gravity, Urine: 1.012 (ref 1.005–1.030)
pH: 5 (ref 5.0–8.0)

## 2019-12-28 LAB — CBC WITH DIFFERENTIAL/PLATELET
Abs Immature Granulocytes: 0.07 10*3/uL (ref 0.00–0.07)
Basophils Absolute: 0 10*3/uL (ref 0.0–0.1)
Basophils Relative: 0 %
Eosinophils Absolute: 0 10*3/uL (ref 0.0–0.5)
Eosinophils Relative: 0 %
HCT: 30.5 % — ABNORMAL LOW (ref 36.0–46.0)
Hemoglobin: 9.3 g/dL — ABNORMAL LOW (ref 12.0–15.0)
Immature Granulocytes: 1 %
Lymphocytes Relative: 5 %
Lymphs Abs: 0.4 10*3/uL — ABNORMAL LOW (ref 0.7–4.0)
MCH: 28.8 pg (ref 26.0–34.0)
MCHC: 30.5 g/dL (ref 30.0–36.0)
MCV: 94.4 fL (ref 80.0–100.0)
Monocytes Absolute: 0.8 10*3/uL (ref 0.1–1.0)
Monocytes Relative: 8 %
Neutro Abs: 8 10*3/uL — ABNORMAL HIGH (ref 1.7–7.7)
Neutrophils Relative %: 86 %
Platelets: 139 10*3/uL — ABNORMAL LOW (ref 150–400)
RBC: 3.23 MIL/uL — ABNORMAL LOW (ref 3.87–5.11)
RDW: 23 % — ABNORMAL HIGH (ref 11.5–15.5)
WBC: 9.4 10*3/uL (ref 4.0–10.5)
nRBC: 0 % (ref 0.0–0.2)

## 2019-12-28 LAB — GLUCOSE, CAPILLARY: Glucose-Capillary: 176 mg/dL — ABNORMAL HIGH (ref 70–99)

## 2019-12-28 LAB — COMPREHENSIVE METABOLIC PANEL
ALT: 12 U/L (ref 0–44)
AST: 22 U/L (ref 15–41)
Albumin: 3.2 g/dL — ABNORMAL LOW (ref 3.5–5.0)
Alkaline Phosphatase: 88 U/L (ref 38–126)
Anion gap: 9 (ref 5–15)
BUN: 69 mg/dL — ABNORMAL HIGH (ref 6–20)
CO2: 27 mmol/L (ref 22–32)
Calcium: 8.8 mg/dL — ABNORMAL LOW (ref 8.9–10.3)
Chloride: 101 mmol/L (ref 98–111)
Creatinine, Ser: 3 mg/dL — ABNORMAL HIGH (ref 0.44–1.00)
GFR calc Af Amer: 20 mL/min — ABNORMAL LOW (ref >60)
GFR calc non Af Amer: 17 mL/min — ABNORMAL LOW (ref >60)
Glucose, Bld: 157 mg/dL — ABNORMAL HIGH (ref 70–99)
Potassium: 4.5 mmol/L (ref 3.5–5.1)
Sodium: 137 mmol/L (ref 135–145)
Total Bilirubin: 1.6 mg/dL — ABNORMAL HIGH (ref 0.3–1.2)
Total Protein: 8.2 g/dL — ABNORMAL HIGH (ref 6.5–8.1)

## 2019-12-28 LAB — TROPONIN I (HIGH SENSITIVITY): Troponin I (High Sensitivity): 24 ng/L — ABNORMAL HIGH (ref <18)

## 2019-12-28 LAB — FERRITIN: Ferritin: 210 ng/mL (ref 11–307)

## 2019-12-28 LAB — PROCALCITONIN: Procalcitonin: 0.36 ng/mL

## 2019-12-28 LAB — C-REACTIVE PROTEIN: CRP: 2 mg/dL — ABNORMAL HIGH (ref <1.0)

## 2019-12-28 LAB — MAGNESIUM: Magnesium: 1.8 mg/dL (ref 1.7–2.4)

## 2019-12-28 LAB — BRAIN NATRIURETIC PEPTIDE: B Natriuretic Peptide: 1125.2 pg/mL — ABNORMAL HIGH (ref 0.0–100.0)

## 2019-12-28 LAB — LACTIC ACID, PLASMA: Lactic Acid, Venous: 1.4 mmol/L (ref 0.5–1.9)

## 2019-12-28 MED ORDER — OXYCODONE-ACETAMINOPHEN 10-325 MG PO TABS: 1.0000 | ORAL_TABLET | Freq: Four times a day (QID) | ORAL | Status: DC | PRN

## 2019-12-28 MED ORDER — ONDANSETRON HCL 4 MG PO TABS: 4.0000 mg | ORAL_TABLET | Freq: Four times a day (QID) | ORAL | Status: DC | PRN

## 2019-12-28 MED ORDER — VANCOMYCIN HCL 2000 MG/400ML IV SOLN: 2000.0000 mg | INTRAVENOUS | Status: DC

## 2019-12-28 MED ORDER — ENOXAPARIN SODIUM 30 MG/0.3ML ~~LOC~~ SOLN
30.0000 mg | Freq: Every day | SUBCUTANEOUS | Status: DC
  Administered 2019-12-28: 30 mg via SUBCUTANEOUS
  Filled 2019-12-28: qty 0.3

## 2019-12-28 MED ORDER — DOCUSATE SODIUM 100 MG PO CAPS
100.0000 mg | ORAL_CAPSULE | Freq: Two times a day (BID) | ORAL | Status: DC
  Administered 2019-12-28 – 2019-12-31 (×6): 100 mg via ORAL
  Filled 2019-12-28 (×6): qty 1

## 2019-12-28 MED ORDER — SODIUM CHLORIDE 0.9% FLUSH: 3.0000 mL | INTRAVENOUS | Status: DC | PRN

## 2019-12-28 MED ORDER — ACETAMINOPHEN 500 MG PO TABS
1000.0000 mg | ORAL_TABLET | Freq: Once | ORAL | Status: AC
  Administered 2019-12-28: 1000 mg via ORAL
  Filled 2019-12-28: qty 2

## 2019-12-28 MED ORDER — FUROSEMIDE 40 MG PO TABS
40.0000 mg | ORAL_TABLET | Freq: Two times a day (BID) | ORAL | Status: DC
  Administered 2019-12-29: 10:00:00 40 mg via ORAL
  Filled 2019-12-28: qty 1

## 2019-12-28 MED ORDER — SODIUM CHLORIDE 0.9% FLUSH
3.0000 mL | Freq: Two times a day (BID) | INTRAVENOUS | Status: DC
  Administered 2019-12-29 – 2019-12-31 (×7): 3 mL via INTRAVENOUS

## 2019-12-28 MED ORDER — NICOTINE 21 MG/24HR TD PT24
21.0000 mg | MEDICATED_PATCH | Freq: Every day | TRANSDERMAL | Status: DC
  Filled 2019-12-28 (×3): qty 1

## 2019-12-28 MED ORDER — ACETAMINOPHEN 325 MG PO TABS: 650.0000 mg | ORAL_TABLET | Freq: Four times a day (QID) | ORAL | Status: DC | PRN

## 2019-12-28 MED ORDER — SODIUM CHLORIDE 0.9 % IV SOLN
2.0000 g | Freq: Two times a day (BID) | INTRAVENOUS | Status: DC
  Administered 2019-12-29 – 2019-12-30 (×3): 2 g via INTRAVENOUS
  Filled 2019-12-28 (×3): qty 2

## 2019-12-28 MED ORDER — OXYCODONE-ACETAMINOPHEN 5-325 MG PO TABS
1.0000 | ORAL_TABLET | Freq: Four times a day (QID) | ORAL | Status: DC | PRN
  Administered 2019-12-28 – 2019-12-31 (×6): 1 via ORAL
  Filled 2019-12-28 (×6): qty 1

## 2019-12-28 MED ORDER — ONDANSETRON HCL 4 MG/2ML IJ SOLN: 4.0000 mg | Freq: Four times a day (QID) | INTRAMUSCULAR | Status: DC | PRN

## 2019-12-28 MED ORDER — ALPRAZOLAM 0.25 MG PO TABS: 0.2500 mg | ORAL_TABLET | Freq: Two times a day (BID) | ORAL | Status: DC | PRN

## 2019-12-28 MED ORDER — CHLORHEXIDINE GLUCONATE CLOTH 2 % EX PADS
6.0000 | MEDICATED_PAD | Freq: Every day | CUTANEOUS | Status: DC
  Administered 2019-12-30 (×2): 6 via TOPICAL

## 2019-12-28 MED ORDER — ORAL CARE MOUTH RINSE
15.0000 mL | Freq: Two times a day (BID) | OROMUCOSAL | Status: DC
  Administered 2019-12-28 – 2019-12-31 (×5): 15 mL via OROMUCOSAL

## 2019-12-28 MED ORDER — VANCOMYCIN HCL 2000 MG/400ML IV SOLN
2000.0000 mg | Freq: Once | INTRAVENOUS | Status: AC
  Administered 2019-12-28: 18:00:00 2000 mg via INTRAVENOUS
  Filled 2019-12-28: qty 400

## 2019-12-28 MED ORDER — ACETAMINOPHEN 650 MG RE SUPP: 650.0000 mg | Freq: Four times a day (QID) | RECTAL | Status: DC | PRN

## 2019-12-28 MED ORDER — SODIUM CHLORIDE 0.9 % IV SOLN
2.0000 g | Freq: Once | INTRAVENOUS | Status: AC
  Administered 2019-12-28: 2 g via INTRAVENOUS
  Filled 2019-12-28: qty 2

## 2019-12-28 MED ORDER — INSULIN ASPART 100 UNIT/ML ~~LOC~~ SOLN: 0.0000 [IU] | Freq: Every day | SUBCUTANEOUS | Status: DC

## 2019-12-28 MED ORDER — SODIUM CHLORIDE 0.9 % IV SOLN
250.0000 mL | INTRAVENOUS | Status: DC | PRN
  Administered 2019-12-30: 250 mL via INTRAVENOUS

## 2019-12-28 MED ORDER — SODIUM CHLORIDE 0.9% FLUSH
3.0000 mL | Freq: Two times a day (BID) | INTRAVENOUS | Status: DC
  Administered 2019-12-29 – 2019-12-31 (×4): 3 mL via INTRAVENOUS

## 2019-12-28 MED ORDER — INSULIN ASPART 100 UNIT/ML ~~LOC~~ SOLN
0.0000 [IU] | Freq: Three times a day (TID) | SUBCUTANEOUS | Status: DC
  Administered 2019-12-29: 1 [IU] via SUBCUTANEOUS
  Administered 2019-12-29: 2 [IU] via SUBCUTANEOUS
  Administered 2019-12-29 – 2019-12-30 (×2): 1 [IU] via SUBCUTANEOUS
  Administered 2019-12-30 – 2019-12-31 (×3): 2 [IU] via SUBCUTANEOUS

## 2019-12-28 MED ORDER — OXYCODONE HCL 5 MG PO TABS
5.0000 mg | ORAL_TABLET | Freq: Four times a day (QID) | ORAL | Status: DC | PRN
  Administered 2019-12-29: 5 mg via ORAL
  Filled 2019-12-28: qty 1

## 2019-12-28 MED ORDER — SODIUM CHLORIDE 0.9% FLUSH
3.0000 mL | Freq: Once | INTRAVENOUS | Status: AC
  Administered 2019-12-28: 3 mL via INTRAVENOUS

## 2019-12-28 NOTE — Progress Notes (Addendum)
Pharmacy Antibiotic Note  [name] is a 54 y.o. female admitted on 12/28/2019 with sepsis.  Pharmacy has been consulted for cefepime and vancomycin dosing.  Plan: Cefepime 2 Gm IV q12h Vancomycin 2 Gm IV q48h for est AUC = 472 Use scr = 3 and Vd = 0.5- scr elevated but lower than previous admissions 3/31: 4.99>4.83>4.64>3.08 Goal AUC = 400-550 F/u scr/cultures/levels  Height: 5\' 3"  (160 cm) Weight: (!) 217.7 kg (480 lb)(over 300lbs patient unsure) IBW/kg (Calculated) : 52.4  Temp (24hrs), Avg:100.5 F (38.1 C), Min:99.8 F (37.7 C), Max:101.1 F (38.4 C)  Recent Labs  Lab 12/28/19 1628  WBC 9.4  CREATININE 3.00*  LATICACIDVEN 1.4    Estimated Creatinine Clearance: 41 mL/min (A) (by C-G formula based on SCr of 3 mg/dL (H)).    No Known Allergies  Antimicrobials this admission: 4/14 cefepime >>  4/14 vancomycin  >>   Dose adjustments this admission:   Microbiology results:  BCx:   UCx:    Sputum:    MRSA PCR:   Thank you for allowing pharmacy to be a part of this patient's care.  Dorrene German 12/28/2019 10:47 PM

## 2019-12-28 NOTE — H&P (Signed)
JASIYAH POLAND VOH:[id number] DOB: [birth year] DOA: 12/28/2019     PCP: Vonna Drafts, FNP   Outpatient Specialists:  CARDS:  Dr.Kelley, Lenn Cal, NP   Patient arrived to ER on 12/28/19 at 1615  Patient coming from: home Lives  With family   Chief Complaint:   Chief Complaint  Patient presents with  . Fever    HPI: ANTWANETTE WESCHE is a 54 y.o. female with medical history significant of Chronic systolic CHF, HTN, CKD IRSWN4O, morbid obesity BMI 85, diabetes, HLD, HTN, GERD, sleep apnea, diabetic neuropathy chronic respiratory failure on 3 L of O2    Presented with  fever for  day temperature up to 101 was seen by physical therapy today noted to be febrile.  She reports chronic cough but not acute changes no nausea no vomiting denies any abdominal pain denies any chest pain.  She has not had any ulcers.  She have had some generalized swelling but that has improved lately. She has not been vaccinated. Her family consists of grandson who does go to school but otherwise other members of her family have been vaccinated she states that her aids has either been vaccinated or been wearing mask all the time around her.  Pt has indwelling catheter  EMS was called her temperature was down to 100 CBG 189 blood pressure was 113/67 and she was tachycardic up to 116 she was given 250 mL bolus Recently was seen in the ER was seen for SVT on 4 April converted prehospital with adenosine by EMS  was able to be discharged to home from ER Patient recently developed significant anasarca and required hemodialysis She had a prolonged hospital stay and was eventually discharged home on hospice and made DNR/DNI at this point patient denies wanting to be DNR/DNI she would like to be full code and have aggressive care.  Infectious risk factors:  Reports  fever, shortness of breath, dry cough,  Has NOt been vaccinated against COVID   in house  PCR testing  Pending  Lab Results  Component  Value Date   SARSCOV2NAA NEGATIVE 11/07/2019   [place] NEGATIVE 10/16/2019   [place] NEGATIVE 09/11/2019   Regarding pertinent Chronic problems:     HTN on isosorbide/hydralazine, Toprol   chronic CHF diastolic/systolic/ combined - last echo 09/12/2019: EF 35 to 40%. Grade I diastolic dysfunction (impaired relaxation). negative nuclear stress test 01/2018.    DM 2 -  Lab Results  Component Value Date   HGBA1C 8.8 (H) 09/11/2019   on Tradjenta     Morbid obesity-   BMI Readings from Last 1 Encounters:  12/28/19 85.03 kg/m        OSA -on nocturnal  CPAP,       CKD stage III - baseline Cr 4 no longer requiring hemodialysis Lab Results  Component Value Date   CREATININE 3.00 (H) 12/28/2019   CREATININE 3.08 (H) 12/18/2019   CREATININE 4.64 (H) 11/21/2019    While in ER:  Chest x-ray showing cardiomegaly and pulmonary vascular congestion UA showing leukocytosis rare bacteria   Hospitalist was called for admission for  sepsis  The following Work up has been ordered so far:  Orders Placed This Encounter  Procedures  . Blood culture (routine x 2)  . SARS CORONAVIRUS 2 (TAT 6-24 HRS) Nasopharyngeal Nasopharyngeal Swab  . DG Chest Portable 1 View  . Lactic acid, plasma  . Comprehensive metabolic panel  . CBC with Differential  . Urinalysis, Routine w reflex microscopic  .  Magnesium  . Brain natriuretic peptide  . Saline Lock IV, Maintain IV access  . Consult to hospitalist  ALL PATIENTS BEING ADMITTED/HAVING PROCEDURES NEED COVID-19 SCREENING  . ED EKG  . EKG 12-Lead     Following Medications were ordered in ER: Medications  vancomycin (VANCOREADY) IVPB 2000 mg/400 mL (2,000 mg Intravenous New Bag/Given 12/28/19 1747)  sodium chloride flush (NS) 0.9 % injection 3 mL (3 mLs Intravenous Given 12/28/19 1630)  acetaminophen (TYLENOL) tablet 1,000 mg (1,000 mg Oral Given 12/28/19 1714)  ceFEPIme (MAXIPIME) 2 g in sodium chloride 0.9 % 100 mL IVPB ( Intravenous  Stopped 12/28/19 1743)        Consult Orders  (From admission, onward)         Start     Ordered   12/28/19 1840  Consult to hospitalist  ALL PATIENTS BEING ADMITTED/HAVING PROCEDURES NEED COVID-19 SCREENING  Once    Comments: ALL PATIENTS BEING ADMITTED/HAVING PROCEDURES NEED COVID-19 SCREENING  Provider:  (Not yet assigned)  Question Answer Comment  Place call to: [facility]   Reason for Consult Admit      12/28/19 1839          Significant initial  Findings: Abnormal Labs Reviewed  COMPREHENSIVE METABOLIC PANEL - Abnormal; Notable for the following components:      Result Value   Glucose, Bld 157 (*)    BUN 69 (*)    Creatinine, Ser 3.00 (*)    Calcium 8.8 (*)    Total Protein 8.2 (*)    Albumin 3.2 (*)    Total Bilirubin 1.6 (*)    GFR calc non Af Amer 17 (*)    GFR calc Af Amer 20 (*)    All other components within normal limits  CBC WITH DIFFERENTIAL/PLATELET - Abnormal; Notable for the following components:   RBC 3.23 (*)    Hemoglobin 9.3 (*)    HCT 30.5 (*)    RDW 23.0 (*)    Platelets 139 (*)    Neutro Abs 8.0 (*)    Lymphs Abs 0.4 (*)    All other components within normal limits  URINALYSIS, ROUTINE W REFLEX MICROSCOPIC - Abnormal; Notable for the following components:   APPearance HAZY (*)    Hgb urine dipstick MODERATE (*)    Leukocytes,Ua LARGE (*)    Bacteria, UA RARE (*)    All other components within normal limits  BRAIN NATRIURETIC PEPTIDE - Abnormal; Notable for the following components:   B Natriuretic Peptide 1,125.2 (*)    All other components within normal limits  TROPONIN I (HIGH SENSITIVITY) - Abnormal; Notable for the following components:   Troponin I (High Sensitivity) 24 (*)    All other components within normal limits   Otherwise labs showing:    Recent Labs  Lab 12/28/19 1628  NA 137  K 4.5  CO2 27  GLUCOSE 157*  BUN 69*  CREATININE 3.00*  CALCIUM 8.8*  MG 1.8    Cr   stable,   Lab Results  Component  Value Date   CREATININE 3.00 (H) 12/28/2019   CREATININE 3.08 (H) 12/18/2019   CREATININE 4.64 (H) 11/21/2019    Recent Labs  Lab 12/28/19 1628  AST 22  ALT 12  ALKPHOS 88  BILITOT 1.6*  PROT 8.2*  ALBUMIN 3.2*   Lab Results  Component Value Date   CALCIUM 8.8 (L) 12/28/2019   PHOS 5.9 (H) 11/21/2019     WBC     Component Value Date/Time  WBC 9.4 12/28/2019 1628   ANC    Component Value Date/Time   NEUTROABS 8.0 (H) 12/28/2019 1628   NEUTROABS 3.9 05/15/2014 0321   ALC No components found for: LYMPHAB    Plt: Lab Results  Component Value Date   PLT 139 (L) 12/28/2019    Lactic Acid, Venous    Component Value Date/Time   LATICACIDVEN 1.4 12/28/2019 1628    Procalcitonin 0.36     Lab Results  Component Value Date   SARSCOV2NAA NEGATIVE 11/07/2019   SARSCOV2NAA NEGATIVE 10/16/2019   [place] NEGATIVE 09/11/2019     HG/HCT Down from baseline see below    Component Value Date/Time   HGB 9.3 (L) 12/28/2019 1628   HGB 10.9 (L) 05/15/2014 0321   HCT 30.5 (L) 12/28/2019 1628   HCT 33.9 (L) 05/15/2014 0321     Troponin 24     ECG: Ordered Personally reviewed by me showing: HR : 115 Rhythm: ectopic tachycardia     no evidence of ischemic changes QTC 573   BNP (last 3 results) Recent Labs    10/15/19 2252 11/07/19 2145 12/28/19 1628  BNP 562.8* 584.6* 1,125.2*     DM  labs:  HbA1C: Recent Labs    09/11/19 1030  HGBA1C 8.8*    CBG (last 3)  No results for input(s): GLUCAP in the last 72 hours.     UA leukocytosis   Urine analysis:    Component Value Date/Time   COLORURINE YELLOW 12/28/2019 1711   APPEARANCEUR HAZY (A) 12/28/2019 1711   LABSPEC 1.012 12/28/2019 1711   PHURINE 5.0 12/28/2019 1711   GLUCOSEU NEGATIVE 12/28/2019 1711   HGBUR MODERATE (A) 12/28/2019 1711   BILIRUBINUR NEGATIVE 12/28/2019 1711   KETONESUR NEGATIVE 12/28/2019 1711   PROTEINUR NEGATIVE 12/28/2019 1711   UROBILINOGEN 1.0 05/16/2013 1758    NITRITE NEGATIVE 12/28/2019 1711   LEUKOCYTESUR LARGE (A) 12/28/2019 1711   Ordered   CXR - fluid overload    ED Triage Vitals  Enc Vitals Group     BP 12/28/19 1626 126/84     Pulse Rate 12/28/19 1626 (!) 116     Resp 12/28/19 1626 (!) 25     Temp 12/28/19 1626 99.8 F (37.7 C)     Temp Source 12/28/19 1626 Oral     SpO2 12/28/19 1625 (!) 86 %     Weight 12/28/19 1630 (!) 480 lb (217.7 kg)     Height 12/28/19 1630 5' 3"  (1.6 m)     Head Circumference --      Peak Flow --      Pain Score 12/28/19 1630 0     Pain Loc --      Pain Edu? --      Excl. in [place]? --   TMAX(24)@       Latest  Blood pressure 133/84, pulse (!) 109, temperature (!) 101.1 F (38.4 C), temperature source Rectal, resp. rate (!) 25, height 5' 3"  (1.6 m), weight (!) 217.7 kg, last menstrual period 11/09/2012, SpO2 98 %.    Review of Systems:    Pertinent positives include: Fevers, chills, fatigue,   Constitutional:  No weight loss, night sweats, weight loss  HEENT:  No headaches, Difficulty swallowing,Tooth/dental problems,Sore throat,  No sneezing, itching, ear ache, nasal congestion, post nasal drip,  Cardio-vascular:  No chest pain, Orthopnea, PND, anasarca, dizziness, palpitations.no Bilateral lower extremity swelling  GI:  No heartburn, indigestion, abdominal pain, nausea, vomiting, diarrhea, change in bowel habits, loss of appetite, melena,  blood in stool, hematemesis Resp:  no shortness of breath at rest. No dyspnea on exertion, No excess mucus, no productive cough, No non-productive cough, No coughing up of blood.No change in color of mucus.No wheezing. Skin:  no rash or lesions. No jaundice GU:  no dysuria, change in color of urine, no urgency or frequency. No straining to urinate.  No flank pain.  Musculoskeletal:  No joint pain or no joint swelling. No decreased range of motion. No back pain.  Psych:  No change in mood or affect. No depression or anxiety. No memory loss.  Neuro: no  localizing neurological complaints, no tingling, no weakness, no double vision, no gait abnormality, no slurred speech, no confusion  All systems reviewed and apart from [place] all are negative  Past Medical History:   Past Medical History:  Diagnosis Date  . Arthritis    "hands, arms, feet, back, knees" (02/17/2018)  . Cardiomyopathy [facility])    From Dr. Bonney Roussel office notes from 01/13/18  . CHF (congestive heart failure) ([place])   . Chronic lower back pain   . Diabetic neuropathy ([place])   . Family history of adverse reaction to anesthesia    Mother is hard to arouse and blood pressure drops  . GERD (gastroesophageal reflux disease)   . High cholesterol   . Hypertension   . Sleep apnea    "need new machine"  - does not use a cpap (02/17/2018)  . Type II diabetes mellitus ([place])    no longer on medications (02/17/2018)      Past Surgical History:  Procedure Laterality Date  . ANTERIOR CERVICAL DECOMP/DISCECTOMY FUSION  03/06/2010   C4-7 w/corpectomy/notes 03/17/2010  . BACK SURGERY    . CARPAL TUNNEL RELEASE Right   . HARDWARE REVISION  04/10/2010   cervical hardware revision screw replacement C4/notes 04/12/2010  . JOINT REPLACEMENT    . LAPAROSCOPIC CHOLECYSTECTOMY  1999  . LUMBAR LAMINECTOMY/DECOMPRESSION MICRODISCECTOMY  09/2010   Archie Endo 10/05/2010  . MULTIPLE EXTRACTIONS WITH ALVEOLOPLASTY [name] 01/19/2015   Procedure: MULTIPLE EXTRACTIONS Three, Four, Five, Seven, eight, nine, ten, eleven, twelve, fourteen, eighteen, twenty-one, twenty-eight, twenty-nine, thirty-one with Alveoloplasty.  ;  Surgeon: Diona Browner, DDS;  Location: [facility];  Service: Oral Surgery;  Laterality: [name];  . REPLACEMENT TOTAL KNEE BILATERAL Bilateral 10/2007-03/2008   "left-right"  . TOENAIL EXCISION Bilateral    great toes  . TONSILLECTOMY    . TOTAL KNEE REVISION Right 02/17/2018  . TOTAL KNEE REVISION Right 02/17/2018   Procedure: RIGHT TOTAL KNEE REVISION;  Surgeon: Newt Minion, MD;  Location: [facility];   Service: Orthopedics;  Laterality: Right;  . TOTAL KNEE REVISION Right 05/14/2018   Procedure: RIGHT TOTAL KNEE REVISION TO HINGED KNEE;  Surgeon: Newt Minion, MD;  Location: [facility];  Service: Orthopedics;  Laterality: Right;    Social History:  Ambulatory   bed bound     reports that she has been smoking cigarettes. She has a 16.00 pack-year smoking history. She has never used smokeless tobacco. She reports previous alcohol use. She reports previous drug use. Drug: Marijuana.     Family History: Family History  Problem Relation Age of Onset  . Kidney disease Mother   . Congestive Heart Failure Mother   . Hypertension Father     Allergies: No Known Allergies   Prior to Admission medications   Medication Sig Start Date End Date Taking? Authorizing Provider  albuterol (PROVENTIL) (2.5 MG/3ML) 0.083% nebulizer solution Take 2.5 mg by nebulization every 4 (  four) hours as needed for wheezing.  08/22/19  Yes [provider]  allopurinol (ZYLOPRIM) 300 MG tablet Take 300 mg by mouth daily. 12/22/19  Yes [provider]  ALPRAZolam (XANAX) 0.25 MG tablet Take 0.25 mg by mouth 2 (two) times daily as needed for anxiety.  12/19/19  Yes [provider]  Baclofen 5 MG TABS Take 1 tablet by mouth 3 (three) times daily as needed for muscle spasms. 12/14/19  Yes [provider]  furosemide (LASIX) 40 MG tablet Take 1 tablet (40 mg total) by mouth 2 (two) times daily. 09/19/19 09/18/20 Yes Vann, Jessica U, DO  isosorbide-hydrALAZINE (BIDIL) 20-37.5 MG tablet Take 1 tablet by mouth 2 times daily at 12 noon and 4 pm. 11/19/19  Yes Domenic Polite, MD  metoprolol succinate (TOPROL-XL) 25 MG 24 hr tablet TAKE 1 TABLET(25 MG) BY MOUTH DAILY WITH OR IMMEDIATELY FOLLOWING A MEAL Patient taking differently: Take 25 mg by mouth daily.  12/19/19  Yes Miquel Dunn, NP  [facility] 4 MG/0.1ML LIQD nasal spray kit Place 0.4 mg into the nose as needed (for overdose).  12/28/18  Yes  [provider]  oxyCODONE-acetaminophen (PERCOCET) 10-325 MG tablet Take 1 tablet by mouth every 8 (eight) hours as needed for pain. Patient taking differently: Take 1 tablet by mouth in the morning, at noon, in the evening, and at bedtime.  09/30/18  Yes Rayburn, Neta Mends, PA-C  OZEMPIC, 0.25 OR 0.5 MG/DOSE, 2 MG/1.5ML SOPN Inject 0.25 mg into the skin every Wednesday. 10/26/19  Yes [provider]  polyethylene glycol (MIRALAX / GLYCOLAX) 17 g packet Take 17 g by mouth daily as needed for mild constipation.   Yes [provider]  senna-docusate (SENOKOT-S) 8.6-50 MG tablet Take 2 tablets by mouth at bedtime as needed for mild constipation or moderate constipation. Patient not taking: Reported on 12/18/2019 11/19/19   Domenic Polite, MD   Physical Exam: Blood pressure 133/84, pulse (!) 109, temperature (!) 101.1 F (38.4 C), temperature source Rectal, resp. rate (!) 25, height 5' 3"  (1.6 m), weight (!) 217.7 kg, last menstrual period 11/09/2012, SpO2 98 %. 1. General:  in No Acute distress  Chronically ill -appearing 2. Psychological: Alert and   Oriented 3. Head/ENT:   Moist   Mucous Membranes                          Head Non traumatic, neck supple                            Poor Dentition 4. SKIN: normal   Skin turgor,  Skin clean Dry and intact no rash, anasarca 5. Heart: Regular rate and rhythm no Murmur, no Rub or gallop 6. Lungs:distant no wheezes or crackles   7. Abdomen: Soft, non-tender,  distended  morbidly obese   8. Lower extremities: no clubbing, cyanosis, severe edema 9. Neurologically Grossly intact, moving all 4 extremities equally  10. MSK: Normal range of motion   All other LABS:     Recent Labs  Lab 12/28/19 1628  WBC 9.4  NEUTROABS 8.0*  HGB 9.3*  HCT 30.5*  MCV 94.4  PLT 139*     Recent Labs  Lab 12/28/19 1628  NA 137  K 4.5  CL 101  CO2 27  GLUCOSE 157*  BUN 69*  CREATININE 3.00*  CALCIUM 8.8*  MG 1.8       Recent Labs  Lab 12/28/19 1628  AST 22  ALT 12  ALKPHOS 88  BILITOT 1.6*  PROT 8.2*  ALBUMIN 3.2*    Cultures:    Component Value Date/Time   SDES URINE, RANDOM 11/08/2019 0736   SPECREQUEST  11/08/2019 0736    NONE Performed at [facility], [place] [address] [place], [place] [address]    CULT MULTIPLE SPECIES PRESENT, SUGGEST RECOLLECTION (A) 11/08/2019 0736   REPTSTATUS 11/09/2019 FINAL 11/08/2019 0736     Radiological Exams on Admission: DG Chest Portable 1 View  Result Date: 12/28/2019 CLINICAL DATA:  Fever and sepsis EXAM: PORTABLE CHEST 1 VIEW COMPARISON:  December 18, 2019 FINDINGS: The heart size and mediastinal contours are unchanged with cardiomegaly. There is prominence of the central pulmonary vasculature. A probable trace left pleural effusion is present. No large airspace consolidation. No acute osseous findings. IMPRESSION: Cardiomegaly and pulmonary vascular congestion. Probable trace left pleural effusion. Electronically Signed   By: Prudencio Pair M.D.   On: 12/28/2019 18:57    Chart has been reviewed    Assessment/Plan   54 y.o. female with medical history significant of Chronic systolic CHF, HTN, CKD [id number], morbid obesity BMI 85, diabetes, HLD, HTN, GERD, sleep apnea, diabetic neuropathy chronic respiratory failure on 3 L of O2    Admitted for SIRS/sepsis and fluid acute on chronic systolic CHF  Present on Admission: . Sepsis (HCC)/SIRS (systemic inflammatory response syndrome) ([place]) -  -Patient meets sepsis criteria with   fever   Tachycardia   Initial lactic acid Lactic Acid, Venous    Component Value Date/Time   LATICACIDVEN 1.4 12/28/2019 1628   Source most likely:  UTI but possibly viral   treat with IV antibiotics, cefepime and vancomycin per pharmacy Once source narrowed down would de-escalate For now patient is febrile and tachycardic and being admitted Covid currently pending patient has not been immunized  follow lactic acid -  Await results of blood and urine culture and adjust antibiotics as needed - Obtain MRSA serologies  - Obtain respiratory panel     . Acute on chronic systolic congestive heart failure ([place]) -patient remains fluid overloaded but somewhat soft blood pressures and febrile will avoid aggressive diuresis at this time. Last echo was over 4 months ago we will repeat May benefit from cardiology input as it is difficult to diurese patient  . Morbid obesity (HCC) -severe resulting in multiple complications.  Will appreciate dietitian consult  . Essential hypertension -somewhat soft blood pressures in the emergency department will hold off blood pressure meds for tonight   . Chronic renal insufficiency, stage 2 (mild) -currently stable continue to monitor avoid nephrotoxic medication no indication for hemodialysis at this time stable creatinine  . GASTROESOPHAGEAL REFLUX, NO ESOPHAGITIS -chronic stable continue home meds  . OSA (obstructive sleep apnea) -resume CPAP once Covid negative   . Chronic pain syndrome chronic restart home medications  Diabetes mellitus hold p.o. medications order sliding scale  History of recent SVT will need to resume metoprolol once blood pressure allows this is difficult to manage patient repeat echogram.  May benefit from cardiology input  Other plan as per orders.  DVT prophylaxis:   Lovenox     Code Status:  FULL CODE  I had personally discussed CODE STATUS with patient    Family Communication:   Family not at  Bedside    Disposition Plan:       To home once workup is complete and patient is stable   Following barriers  for discharge:                            Electrolytes corrected                                                          Afebrile, white count improving able to transition to PO antibiotics                      Would benefit from PT/OT eval prior to DC  Ordered                                      Transition of care consulted                    Nutrition    consulted                                      Consults called:  emailed cardiology   Admission status:  ED Disposition    ED Disposition Condition [place]: [place] [100102]  Level of Care: Stepdown [14]  Admit to SDU based on following criteria: Severe physiological/psychological symptoms:  Any diagnosis requiring assessment & intervention at least every 4 hours on an ongoing basis to obtain desired patient outcomes including stability and rehabilitation  Covid Evaluation: Symptomatic Person Under Investigation (PUI)  Diagnosis: Sepsis [facility]) [[id number]]  Admitting Physician: Toy Baker [[id number]]  Attending Physician: Toy Baker [[id number]]       Obs      Level of care     SDU tele indefinitely please discontinue once patient no longer qualifies   Precautions: admitted as  PUI   Airborne and Contact precautions  If Covid PCR is negative  - please DC precautions     PPE: Used by the provider:   P100  eye Goggles,  Gloves    Loisann Roach 12/28/2019, 10:42 PM    [facility]     after 2 AM please page floor coverage PA If 7AM-7PM, please contact the day team taking care of the patient using [email or web address]   Patient was evaluated in the context of the global COVID-19 pandemic, which necessitated consideration that the patient might be at risk for infection with the SARS-CoV-2 virus that causes COVID-19. Institutional protocols and algorithms that pertain to the evaluation of patients at risk for COVID-19 are in a state of rapid change based on information released by regulatory bodies including the CDC and federal and state organizations. These policies and algorithms were followed during the patient's care.

## 2019-12-28 NOTE — Progress Notes (Signed)
[facility] Documentation  Pt is an active hospice pt with [facility]. Notified by hospital that pt is in house. Liaison will follow during course in ED and when pt is admitted.   Pt is a full code with a hospice diagnosis of Decompensated combined systolic and diastolic heart failure.   Please do not hesitate to outreach with any questions.   Thank you,  Freddie Breech, RN [facility] Liaison [phone]

## 2019-12-28 NOTE — Progress Notes (Signed)
Nocturnal CPAP held at this time. Awaiting COVID pcr results. RT will continue to follow.

## 2019-12-28 NOTE — ED Notes (Signed)
Received call back from [facility], advised them that pt will be admitted and they will let their hospital liason know.

## 2019-12-28 NOTE — ED Provider Notes (Signed)
[facility] DEPT Provider Note   CSN: [id number] Arrival date & time: 12/28/19  1615     History Chief Complaint  Patient presents with  . Fever    [name] is a 54 y.o. female.  Presents ER chief complaint fever.  Is felt generally weak today, fevers, chills but denies any abdominal pain, vomiting, new rash.  Has a chronic Foley.  Seems to be working appropriately.  Medical history concerning for super morbid obesity, CKD, heart failure with reduced ejection fraction.  Enrolled in hospice care.  Patient states she wants full care, full code.  HPI     Past Medical History:  Diagnosis Date  . Arthritis    "hands, arms, feet, back, knees" (02/17/2018)  . Cardiomyopathy [facility])    From Dr. Bonney Roussel office notes from 01/13/18  . CHF (congestive heart failure) ([place])   . Chronic lower back pain   . Diabetic neuropathy ([place])   . Family history of adverse reaction to anesthesia    Mother is hard to arouse and blood pressure drops  . GERD (gastroesophageal reflux disease)   . High cholesterol   . Hypertension   . Sleep apnea    "need new machine"  - does not use a cpap (02/17/2018)  . Type II diabetes mellitus ([place])    no longer on medications (02/17/2018)    Patient Active Problem List   Diagnosis Date Noted  . Palliative care by specialist   . Goals of care, counseling/discussion   . Hyperkalemia 11/08/2019  . ARF (acute renal failure) ([place]) 11/07/2019  . Acute on chronic diastolic CHF (congestive heart failure) ([place]) 10/16/2019  . CKD (chronic kidney disease), stage III 10/16/2019  . Acute exacerbation of CHF (congestive heart failure) ([place]) 10/16/2019  . Acute on chronic systolic congestive heart failure ([place]) 09/11/2019  . AKI (acute kidney injury) ([place]) 09/11/2019  . Chronic pain syndrome 09/11/2019  . Heart failure ([place]) 09/11/2019  . No-show for appointment 07/08/2019  . Chronic systolic heart failure ([place]) 04/26/2019  . S/P  revision of total knee, right 05/14/2018  . Dislocated knee, right, sequela   . S/P revision of total knee 02/17/2018  . Failed total right knee replacement ([place])   . Chronic renal insufficiency, stage 2 (mild) 11/10/2017  . Acute CHF (congestive heart failure) ([place]) 11/10/2017  . Failed total knee arthroplasty, sequela 08/31/2017  . Lower extremity cellulitis 01/03/2013  . Diabetes mellitus ([place]) 01/03/2013  . Morbid obesity ([place]) 11/12/2006  . TOBACCO DEPENDENCE 11/12/2006  . Depression 11/12/2006  . Essential hypertension 11/12/2006  . GASTROESOPHAGEAL REFLUX, NO ESOPHAGITIS 11/12/2006  . ACNE 11/12/2006  . OSTEOARTHRITIS, LOWER LEG 11/12/2006  . OSA (obstructive sleep apnea) 11/12/2006    Past Surgical History:  Procedure Laterality Date  . ANTERIOR CERVICAL DECOMP/DISCECTOMY FUSION  03/06/2010   C4-7 w/corpectomy/notes 03/17/2010  . BACK SURGERY    . CARPAL TUNNEL RELEASE Right   . HARDWARE REVISION  04/10/2010   cervical hardware revision screw replacement C4/notes 04/12/2010  . JOINT REPLACEMENT    . LAPAROSCOPIC CHOLECYSTECTOMY  1999  . LUMBAR LAMINECTOMY/DECOMPRESSION MICRODISCECTOMY  09/2010   Archie Endo 10/05/2010  . MULTIPLE EXTRACTIONS WITH ALVEOLOPLASTY [name] 01/19/2015   Procedure: MULTIPLE EXTRACTIONS Three, Four, Five, Seven, eight, nine, ten, eleven, twelve, fourteen, eighteen, twenty-one, twenty-eight, twenty-nine, thirty-one with Alveoloplasty.  ;  Surgeon: Diona Browner, DDS;  Location: [facility];  Service: Oral Surgery;  Laterality: [name];  . REPLACEMENT TOTAL KNEE BILATERAL Bilateral 10/2007-03/2008   "left-right"  .  TOENAIL EXCISION Bilateral    great toes  . TONSILLECTOMY    . TOTAL KNEE REVISION Right 02/17/2018  . TOTAL KNEE REVISION Right 02/17/2018   Procedure: RIGHT TOTAL KNEE REVISION;  Surgeon: Newt Minion, MD;  Location: [facility];  Service: Orthopedics;  Laterality: Right;  . TOTAL KNEE REVISION Right 05/14/2018   Procedure: RIGHT TOTAL KNEE REVISION TO HINGED KNEE;   Surgeon: Newt Minion, MD;  Location: [facility];  Service: Orthopedics;  Laterality: Right;     OB History   No obstetric history on file.     Family History  Problem Relation Age of Onset  . Kidney disease Mother   . Congestive Heart Failure Mother   . Hypertension Father     Social History   Tobacco Use  . Smoking status: Current Every Day Smoker    Packs/day: 0.50    Years: 32.00    Pack years: 16.00    Types: Cigarettes  . Smokeless tobacco: Never Used  Substance Use Topics  . Alcohol use: Not Currently  . Drug use: Not Currently    Types: Marijuana    Home Medications Prior to Admission medications   Medication Sig Start Date End Date Taking? Authorizing Provider  albuterol (PROVENTIL) (2.5 MG/3ML) 0.083% nebulizer solution Take 2.5 mg by nebulization every 4 (four) hours as needed for wheezing.  08/22/19   [provider]  Baclofen 5 MG TABS Take 1 tablet by mouth 3 (three) times daily as needed for muscle spasms. 12/14/19   [provider]  furosemide (LASIX) 40 MG tablet Take 1 tablet (40 mg total) by mouth 2 (two) times daily. 09/19/19 09/18/20  Geradine Girt, DO  isosorbide-hydrALAZINE (BIDIL) 20-37.5 MG tablet Take 1 tablet by mouth 2 times daily at 12 noon and 4 pm. 11/19/19   Domenic Polite, MD  metoprolol succinate (TOPROL-XL) 25 MG 24 hr tablet TAKE 1 TABLET(25 MG) BY MOUTH DAILY WITH OR IMMEDIATELY FOLLOWING A MEAL 12/19/19   Miquel Dunn, NP  [facility] 4 MG/0.1ML LIQD nasal spray kit Place 0.4 mg into the nose as needed (for overdose).  12/28/18   [provider]  oxyCODONE-acetaminophen (PERCOCET) 10-325 MG tablet Take 1 tablet by mouth every 8 (eight) hours as needed for pain. Patient taking differently: Take 1 tablet by mouth in the morning, at noon, in the evening, and at bedtime.  09/30/18   Rayburn, Neta Mends, PA-C  OZEMPIC, 0.25 OR 0.5 MG/DOSE, 2 MG/1.5ML SOPN Inject 0.25 mg into the skin every Wednesday. 10/26/19    [provider]  polyethylene glycol (MIRALAX / GLYCOLAX) 17 g packet Take 17 g by mouth daily as needed for mild constipation.    [provider]  senna-docusate (SENOKOT-S) 8.6-50 MG tablet Take 2 tablets by mouth at bedtime as needed for mild constipation or moderate constipation. Patient not taking: Reported on 12/18/2019 11/19/19   Domenic Polite, MD    Allergies    Patient has no known allergies.  Review of Systems   Review of Systems  Constitutional: Positive for chills, fatigue and fever.  HENT: Negative for ear pain and sore throat.   Eyes: Negative for pain and visual disturbance.  Respiratory: Negative for cough and shortness of breath.   Cardiovascular: Negative for chest pain and palpitations.  Gastrointestinal: Negative for abdominal pain and vomiting.  Genitourinary: Negative for dysuria and hematuria.  Musculoskeletal: Negative for arthralgias and back pain.  Skin: Negative for color change and rash.  Neurological: Negative for seizures  and syncope.  All other systems reviewed and are negative.   Physical Exam Updated Vital Signs BP 126/84   Pulse (!) 116   Temp 99.8 F (37.7 C) (Oral)   Resp (!) 25   Ht 5' 3"  (1.6 m)   Wt (!) 217.7 kg Comment: over 300lbs patient unsure  LMP 11/09/2012   SpO2 99%   BMI 85.03 kg/m   Physical Exam Vitals and nursing note reviewed.  Constitutional:      General: She is not in acute distress.    Appearance: She is well-developed.     Comments: Super morbidly obese  HENT:     Head: Normocephalic and atraumatic.  Eyes:     Conjunctiva/sclera: Conjunctivae normal.  Cardiovascular:     Rate and Rhythm: Normal rate and regular rhythm.     Heart sounds: No murmur.  Pulmonary:     Effort: Pulmonary effort is normal. No respiratory distress.     Breath sounds: Normal breath sounds.  Abdominal:     Palpations: Abdomen is soft.     Tenderness: There is no abdominal tenderness.  Musculoskeletal:     Cervical  back: Neck supple.     Comments: Morbid obesity in all extremities  Skin:    General: Skin is warm and dry.     Comments: No significant skin breakdown on skin inspection  Neurological:     General: No focal deficit present.     Mental Status: She is alert and oriented to person, place, and time.  Psychiatric:        Mood and Affect: Mood normal.        Behavior: Behavior normal.     ED Results / Procedures / Treatments   Labs (all labs ordered are listed, but only abnormal results are displayed) Labs Reviewed  CBC WITH DIFFERENTIAL/PLATELET - Abnormal; Notable for the following components:      Result Value   RBC 3.23 (*)    Hemoglobin 9.3 (*)    HCT 30.5 (*)    RDW 23.0 (*)    Platelets 139 (*)    All other components within normal limits  CULTURE, BLOOD (ROUTINE X 2)  CULTURE, BLOOD (ROUTINE X 2)  LACTIC ACID, PLASMA  LACTIC ACID, PLASMA  COMPREHENSIVE METABOLIC PANEL  URINALYSIS, ROUTINE W REFLEX MICROSCOPIC  BRAIN NATRIURETIC PEPTIDE  MAGNESIUM  TROPONIN I (HIGH SENSITIVITY)    EKG None  Radiology No results found.  Procedures Procedures (including critical care time)  Medications Ordered in ED Medications  acetaminophen (TYLENOL) tablet 1,000 mg (has no administration in time range)  sodium chloride flush (NS) 0.9 % injection 3 mL (3 mLs Intravenous Given 12/28/19 1630)    ED Course  I have reviewed the triage vital signs and the nursing notes.  Pertinent labs & imaging results that were available during my care of the patient were reviewed by me and considered in my medical decision making (see chart for details).  Clinical Course as of Dec 27 2356  Wed Dec 28, 2019  1648 Discussed with [facility] hosp[facility] - pt enrolled in hospice but full code   [RD]  1648 Completed assessment, febrile, no clear source, super morbidly obese but in no distress, wants everything done, full code   [RD]  1649 Will start empiric abx while awaiting work up   [RD]      Clinical Course User Index [RD] Lucrezia Starch, MD   MDM Rules/Calculators/A&P  54 year old lady who presented to ER with concern for fever.  On exam patient noted to be febrile, tachycardic.  No other localizing symptoms.  Obtained broad work-up, blood cultures, urine, CXR.  Start empiric broad-spectrum antibiotics.  Work-up concerning for UTI.  Consult hospitalist for admission.  Dr. Roel Cluck will admit.   Final Clinical Impression(s) / ED Diagnoses Final diagnoses:  SIRS (systemic inflammatory response syndrome) (HCC)  Urinary tract infection without hematuria, site unspecified    Rx / DC Orders ED Discharge Orders    None       Lucrezia Starch, MD 12/29/19 0002

## 2019-12-28 NOTE — Progress Notes (Signed)
A consult was received from an ED physician for vancomycin + cefepime per pharmacy dosing.  The patient's profile has been reviewed for ht/wt/allergies/indication/available labs.    A one time order has been placed for vancomycin 2000 mg IV + cefepime 2 g IV once.    Further antibiotics/pharmacy consults should be ordered by admitting physician if indicated.                       Thank you, Lenis Noon, PharmD 12/28/2019  4:52 PM

## 2019-12-28 NOTE — ED Triage Notes (Addendum)
Patient arrived via [facility] from home.   C/o fever X2 days. Temp-101 with physical therapy today.   No other symptoms per patient and ems    A/Ox4 Bed bound with urine cath     Temp-100.0 with ems CBG-189 250 mLs of NS given by EMS bp-113/67 HR-116  Patient on 3 liters all the time at home and uses CPAP

## 2019-12-29 ENCOUNTER — Observation Stay (HOSPITAL_COMMUNITY)

## 2019-12-29 DIAGNOSIS — Z9981 Dependence on supplemental oxygen: Secondary | ICD-10-CM

## 2019-12-29 DIAGNOSIS — A4152 Sepsis due to Pseudomonas: Secondary | ICD-10-CM | POA: Diagnosis not present

## 2019-12-29 DIAGNOSIS — M2550 Pain in unspecified joint: Secondary | ICD-10-CM | POA: Diagnosis not present

## 2019-12-29 DIAGNOSIS — Z96651 Presence of right artificial knee joint: Secondary | ICD-10-CM | POA: Diagnosis not present

## 2019-12-29 DIAGNOSIS — I130 Hypertensive heart and chronic kidney disease with heart failure and stage 1 through stage 4 chronic kidney disease, or unspecified chronic kidney disease: Secondary | ICD-10-CM | POA: Diagnosis present

## 2019-12-29 DIAGNOSIS — I248 Other forms of acute ischemic heart disease: Secondary | ICD-10-CM | POA: Diagnosis present

## 2019-12-29 DIAGNOSIS — Z20822 Contact with and (suspected) exposure to covid-19: Secondary | ICD-10-CM | POA: Diagnosis not present

## 2019-12-29 DIAGNOSIS — E782 Mixed hyperlipidemia: Secondary | ICD-10-CM

## 2019-12-29 DIAGNOSIS — N184 Chronic kidney disease, stage 4 (severe): Secondary | ICD-10-CM | POA: Diagnosis not present

## 2019-12-29 DIAGNOSIS — J9621 Acute and chronic respiratory failure with hypoxia: Secondary | ICD-10-CM | POA: Diagnosis not present

## 2019-12-29 DIAGNOSIS — I509 Heart failure, unspecified: Secondary | ICD-10-CM

## 2019-12-29 DIAGNOSIS — Z7401 Bed confinement status: Secondary | ICD-10-CM | POA: Diagnosis not present

## 2019-12-29 DIAGNOSIS — R531 Weakness: Secondary | ICD-10-CM | POA: Diagnosis not present

## 2019-12-29 DIAGNOSIS — Y846 Urinary catheterization as the cause of abnormal reaction of the patient, or of later complication, without mention of misadventure at the time of the procedure: Secondary | ICD-10-CM | POA: Diagnosis present

## 2019-12-29 DIAGNOSIS — Z743 Need for continuous supervision: Secondary | ICD-10-CM | POA: Diagnosis not present

## 2019-12-29 DIAGNOSIS — T83518A Infection and inflammatory reaction due to other urinary catheter, initial encounter: Secondary | ICD-10-CM | POA: Diagnosis not present

## 2019-12-29 DIAGNOSIS — Z66 Do not resuscitate: Secondary | ICD-10-CM | POA: Diagnosis present

## 2019-12-29 DIAGNOSIS — I5023 Acute on chronic systolic (congestive) heart failure: Secondary | ICD-10-CM | POA: Diagnosis not present

## 2019-12-29 DIAGNOSIS — F1120 Opioid dependence, uncomplicated: Secondary | ICD-10-CM | POA: Diagnosis present

## 2019-12-29 DIAGNOSIS — I428 Other cardiomyopathies: Secondary | ICD-10-CM | POA: Diagnosis present

## 2019-12-29 DIAGNOSIS — Z6841 Body Mass Index (BMI) 40.0 and over, adult: Secondary | ICD-10-CM | POA: Diagnosis not present

## 2019-12-29 DIAGNOSIS — K219 Gastro-esophageal reflux disease without esophagitis: Secondary | ICD-10-CM | POA: Diagnosis present

## 2019-12-29 DIAGNOSIS — J9622 Acute and chronic respiratory failure with hypercapnia: Secondary | ICD-10-CM | POA: Diagnosis present

## 2019-12-29 DIAGNOSIS — E1122 Type 2 diabetes mellitus with diabetic chronic kidney disease: Secondary | ICD-10-CM | POA: Diagnosis not present

## 2019-12-29 DIAGNOSIS — E662 Morbid (severe) obesity with alveolar hypoventilation: Secondary | ICD-10-CM | POA: Diagnosis present

## 2019-12-29 DIAGNOSIS — I471 Supraventricular tachycardia: Secondary | ICD-10-CM | POA: Diagnosis present

## 2019-12-29 DIAGNOSIS — N390 Urinary tract infection, site not specified: Secondary | ICD-10-CM | POA: Diagnosis not present

## 2019-12-29 DIAGNOSIS — I5082 Biventricular heart failure: Secondary | ICD-10-CM | POA: Diagnosis present

## 2019-12-29 DIAGNOSIS — I5043 Acute on chronic combined systolic (congestive) and diastolic (congestive) heart failure: Secondary | ICD-10-CM | POA: Diagnosis not present

## 2019-12-29 DIAGNOSIS — E1140 Type 2 diabetes mellitus with diabetic neuropathy, unspecified: Secondary | ICD-10-CM | POA: Diagnosis present

## 2019-12-29 DIAGNOSIS — R778 Other specified abnormalities of plasma proteins: Secondary | ICD-10-CM

## 2019-12-29 DIAGNOSIS — I452 Bifascicular block: Secondary | ICD-10-CM | POA: Diagnosis present

## 2019-12-29 DIAGNOSIS — A419 Sepsis, unspecified organism: Secondary | ICD-10-CM | POA: Diagnosis not present

## 2019-12-29 DIAGNOSIS — R6510 Systemic inflammatory response syndrome (SIRS) of non-infectious origin without acute organ dysfunction: Secondary | ICD-10-CM | POA: Diagnosis not present

## 2019-12-29 DIAGNOSIS — G894 Chronic pain syndrome: Secondary | ICD-10-CM | POA: Diagnosis present

## 2019-12-29 DIAGNOSIS — J9611 Chronic respiratory failure with hypoxia: Secondary | ICD-10-CM

## 2019-12-29 DIAGNOSIS — R509 Fever, unspecified: Secondary | ICD-10-CM | POA: Diagnosis not present

## 2019-12-29 DIAGNOSIS — G4733 Obstructive sleep apnea (adult) (pediatric): Secondary | ICD-10-CM | POA: Diagnosis not present

## 2019-12-29 LAB — COMPREHENSIVE METABOLIC PANEL
ALT: 13 U/L (ref 0–44)
AST: 20 U/L (ref 15–41)
Albumin: 3.1 g/dL — ABNORMAL LOW (ref 3.5–5.0)
Alkaline Phosphatase: 85 U/L (ref 38–126)
Anion gap: 8 (ref 5–15)
BUN: 69 mg/dL — ABNORMAL HIGH (ref 6–20)
CO2: 29 mmol/L (ref 22–32)
Calcium: 8.7 mg/dL — ABNORMAL LOW (ref 8.9–10.3)
Chloride: 100 mmol/L (ref 98–111)
Creatinine, Ser: 2.84 mg/dL — ABNORMAL HIGH (ref 0.44–1.00)
GFR calc Af Amer: 21 mL/min — ABNORMAL LOW (ref >60)
GFR calc non Af Amer: 18 mL/min — ABNORMAL LOW (ref >60)
Glucose, Bld: 178 mg/dL — ABNORMAL HIGH (ref 70–99)
Potassium: 4.4 mmol/L (ref 3.5–5.1)
Sodium: 137 mmol/L (ref 135–145)
Total Bilirubin: 1.9 mg/dL — ABNORMAL HIGH (ref 0.3–1.2)
Total Protein: 8 g/dL (ref 6.5–8.1)

## 2019-12-29 LAB — BLOOD CULTURE ID PANEL (REFLEXED)

## 2019-12-29 LAB — GLUCOSE, CAPILLARY
Glucose-Capillary: 129 mg/dL — ABNORMAL HIGH (ref 70–99)
Glucose-Capillary: 158 mg/dL — ABNORMAL HIGH (ref 70–99)
Glucose-Capillary: 149 mg/dL — ABNORMAL HIGH (ref 70–99)
Glucose-Capillary: 188 mg/dL — ABNORMAL HIGH (ref 70–99)

## 2019-12-29 LAB — CBC WITH DIFFERENTIAL/PLATELET
Abs Immature Granulocytes: 0.07 10*3/uL (ref 0.00–0.07)
Basophils Absolute: 0 10*3/uL (ref 0.0–0.1)
Basophils Relative: 1 %
Eosinophils Absolute: 0 10*3/uL (ref 0.0–0.5)
Eosinophils Relative: 0 %
HCT: 30.4 % — ABNORMAL LOW (ref 36.0–46.0)
Hemoglobin: 8.9 g/dL — ABNORMAL LOW (ref 12.0–15.0)
Immature Granulocytes: 1 %
Lymphocytes Relative: 9 %
Lymphs Abs: 0.7 10*3/uL (ref 0.7–4.0)
MCH: 28.5 pg (ref 26.0–34.0)
MCHC: 29.3 g/dL — ABNORMAL LOW (ref 30.0–36.0)
MCV: 97.4 fL (ref 80.0–100.0)
Monocytes Absolute: 1 10*3/uL (ref 0.1–1.0)
Monocytes Relative: 12 %
Neutro Abs: 6.6 10*3/uL (ref 1.7–7.7)
Neutrophils Relative %: 77 %
Platelets: 132 10*3/uL — ABNORMAL LOW (ref 150–400)
RBC: 3.12 MIL/uL — ABNORMAL LOW (ref 3.87–5.11)
RDW: 23.3 % — ABNORMAL HIGH (ref 11.5–15.5)
WBC: 8.5 10*3/uL (ref 4.0–10.5)
nRBC: 0 % (ref 0.0–0.2)

## 2019-12-29 LAB — MAGNESIUM: Magnesium: 1.8 mg/dL (ref 1.7–2.4)

## 2019-12-29 LAB — TSH: TSH: 2.233 u[IU]/mL (ref 0.350–4.500)

## 2019-12-29 LAB — ECHOCARDIOGRAM LIMITED
Height: 63 in
Weight: 8768 oz

## 2019-12-29 LAB — SARS CORONAVIRUS 2 (TAT 6-24 HRS): SARS Coronavirus 2: NEGATIVE

## 2019-12-29 LAB — MRSA PCR SCREENING: MRSA by PCR: NEGATIVE

## 2019-12-29 LAB — PHOSPHORUS: Phosphorus: 3.3 mg/dL (ref 2.5–4.6)

## 2019-12-29 LAB — HEMOGLOBIN A1C
Hgb A1c MFr Bld: 6.9 % — ABNORMAL HIGH (ref 4.8–5.6)
Mean Plasma Glucose: 151.33 mg/dL

## 2019-12-29 MED ORDER — METOLAZONE 2.5 MG PO TABS
2.5000 mg | ORAL_TABLET | Freq: Every day | ORAL | Status: DC
  Administered 2019-12-29 – 2019-12-30 (×2): 2.5 mg via ORAL
  Filled 2019-12-29 (×2): qty 1

## 2019-12-29 MED ORDER — PERFLUTREN LIPID MICROSPHERE
1.0000 mL | INTRAVENOUS | Status: AC | PRN
  Administered 2019-12-29: 2 mL via INTRAVENOUS
  Filled 2019-12-29: qty 10

## 2019-12-29 MED ORDER — FUROSEMIDE 10 MG/ML IJ SOLN
60.0000 mg | Freq: Two times a day (BID) | INTRAMUSCULAR | Status: DC
  Administered 2019-12-29 – 2019-12-31 (×4): 60 mg via INTRAVENOUS
  Filled 2019-12-29 (×4): qty 6

## 2019-12-29 MED ORDER — MAGNESIUM SULFATE 2 GM/50ML IV SOLN
2.0000 g | Freq: Once | INTRAVENOUS | Status: AC
  Administered 2019-12-29: 2 g via INTRAVENOUS

## 2019-12-29 MED ORDER — ENOXAPARIN SODIUM 40 MG/0.4ML ~~LOC~~ SOLN
40.0000 mg | Freq: Every day | SUBCUTANEOUS | Status: DC
  Administered 2019-12-29 – 2019-12-30 (×2): 40 mg via SUBCUTANEOUS
  Filled 2019-12-29 (×2): qty 0.4

## 2019-12-29 MED ORDER — PRO-STAT SUGAR FREE PO LIQD
30.0000 mL | Freq: Two times a day (BID) | ORAL | Status: DC
  Administered 2019-12-29 – 2019-12-30 (×3): 30 mL via ORAL
  Filled 2019-12-29 (×4): qty 30

## 2019-12-29 MED ORDER — METOPROLOL SUCCINATE ER 25 MG PO TB24
25.0000 mg | ORAL_TABLET | Freq: Every day | ORAL | Status: DC
  Administered 2019-12-29 – 2019-12-31 (×3): 25 mg via ORAL
  Filled 2019-12-29 (×3): qty 1

## 2019-12-29 MED ORDER — ENSURE MAX PROTEIN PO LIQD
11.0000 [oz_av] | Freq: Two times a day (BID) | ORAL | Status: DC
  Administered 2019-12-29 – 2019-12-30 (×2): 11 [oz_av] via ORAL
  Filled 2019-12-29 (×6): qty 330

## 2019-12-29 NOTE — Progress Notes (Signed)
[facility]) hospital liaison note.  This is a related and covered admission with an ACC diagnosis of Decompensated combined systolic and diastolic heart failure per Dr. Tomasa Hosteller.  Patient is a full code. Patient was transported to ED for treatment of fever. She was admitted for UTI and SIRS.   Visited patient at bedside today. She is alert and oriented and states she feels much better today.  She is currently in ICU but states she is going to be moved to step down unit at some point. Spoke with Dr. Roslynn Amble from the ED yesterday, he states patient verbalized wanting full treatment and would still like to receive dialysis. She has also verbalized to her [facility] CSW that she would revoke her hospice benefit in order to receive dialysis. Per nephrology patient is not currently felt not to be a dialysis candidate.   VS: 99.3, 113/75, 105, 19, 97% RA I/O: 319.1/600  Abnormal labs: Glucose 178, BUN 69, Creatinine 2.84, Calcium 8.7, Albumin 3.1, total bilirubin 1.9, GFR 21, RBC 3.12, Hgb 8.9, HCT 30.4, MCHC 29.3, RDW 23.3, HAIC 6.9,   CXR: IMPRESSION: Cardiomegaly and pulmonary vascular congestion. Probable trace left pleural effusion.  Per MD notes:  Assessment & Plan: Acute on chronic systolic and diastolic CHF -Last echo in March with EF of 25% -Admitted with anasarca again, she wishes to be full code and have full scope of treatment which is not in line with hospice philosophy -Continue IV Lasix today -Cardiology Dr. Virgina Jock to follow Chronic kidney disease stage IV -Baseline creatinine is around 2, during her recent hospitalization, creatinine trended up to the high 4 range, now improving -Monitor with diuresis -Renally dose medications Acute on chronic hypoxic and hypercarbic respiratory failure Super morbid obesity, OSA, OHS -Diuretics as noted above, CPAP nightly and daytime naps -Long-term needs weight loss Sepsis, likely catheter associated UTI -Discontinue Foley  catheter today, this may limit accurate I/os -Discontinue vancomycin, continue cefepime -Follow-up blood and urine cultures Chronic pain syndrome -Decrease dose of pain meds to prevent oversedation, hypercarbia  DC planning: to be determined.  GOC: not Clear -Patient is a full code and wants to pursue all treatments. There is a strong need to for discussion of realistic GOC and treatment options.  IDT (interdisc team): updated  Please contact [facility] for all ACC patient transfer needs upon discharge.  Farrel Gordon, RN [facility] Liaison (listed on AMION under Hospice)  [phone]

## 2019-12-29 NOTE — Progress Notes (Signed)
PROGRESS NOTE    [name]  XLK:[id number] DOB: 1966-06-29 DOA: 12/28/2019 PCP: Vonna Drafts, FNP  Brief Narrative:54 year old w/ super morbid obesity, BMI of 93 (>500lbs) with chronic systolic heart failure/NICM EF 35%, diabetes mellitus, morbid obesity, chronic kidney disease 3-4 was hospitalized from 2/23-3/8 at [facility] with anasarca, worsening CHF, cardiorenal syndrome and progressive renal failure -She was seen by nephrology and not felt to be a dialysis candidate -Followed by palliative care as well and eventually decision was made to transition to home with hospice services - was brought in by EMS yesterday evening due to fevers, weakness  -Her creatinine is better from recent discharge, down to 3.0 last night, BNP was elevated at 1125, chest x-ray noted pulmonary vascular congestion, urinalysis with pyuria and bacteriuria. -She was started on IV antibiotics for presumed catheter associated infection, as well as IV Lasix last night and admitted    Assessment & Plan:   Acute on chronic systolic and diastolic CHF -Last echo in March with EF of 25% -Admitted with anasarca again, she wishes to be full code and have full scope of treatment which is not in line with hospice philosophy -Continue IV Lasix today -Cardiology Dr. Virgina Jock to follow  Chronic kidney disease stage IV -Baseline creatinine is around 2, during her recent hospitalization, creatinine trended up to the high 4 range, now improving -Monitor with diuresis -Renally dose medications  Acute on chronic hypoxic and hypercarbic respiratory failure Super morbid obesity, OSA, OHS -Diuretics as noted above, CPAP nightly and daytime naps -Long-term needs weight loss  Sepsis, likely catheter associated UTI -Discontinue Foley catheter today, this may limit accurate I/os -Discontinue vancomycin, continue cefepime -Follow-up blood and urine cultures  Chronic pain syndrome -Decrease dose of pain meds to  prevent oversedation, hypercarbia  DVT prophylaxis: Lovenox Code Status: Full code Family Communication: Discussed patient bedside Disposition Plan:  Dispo: The patient is from: Home              Anticipated d/c is to: Likely home              Anticipated d/c date is: To be determined              Patient currently not medically stable for discharge        Consultants:   Cardiology   Procedures:   Antimicrobials:    Subjective: -Reports feeling a little better today, history of fevers for the last few days at home and weakness -She had a Foley catheter replaced on 4/13 at her home  Objective: Vitals:   12/29/19 0304 12/29/19 0420 12/29/19 0611 12/29/19 0800  BP:  135/63    Pulse: 98 (!) 103    Resp: 17 (!) 21    Temp:   98.5 F (36.9 C) 98.7 F (37.1 C)  TempSrc:   Oral Oral  SpO2: 100% 99%    Weight:      Height:        Intake/Output Summary (Last 24 hours) at 12/29/2019 1136 Last data filed at 12/29/2019 0900 Gross per 24 hour  Intake 679.12 ml  Output 600 ml  Net 79.12 ml   Filed Weights   12/28/19 1630 12/28/19 2315  Weight: (!) 217.7 kg (!) 248.6 kg    Examination:  General exam: Super morbid obese female, sitting up in bed, awake alert oriented x3, no distress HEENT, neck obese unable to assess JVD  respiratory system: Distant breath sounds anteriorly. Cardiovascular system: S1 & S2 heard, RRR.  Gastrointestinal system: Abdomen is nondistended, soft and nontender.Normal bowel sounds heard. Central nervous system: Alert and oriented.  Decreased strength, deconditioned lower extremities Extremities: Massive edema extending to abdomen Psychiatry: Poor insight   Data Reviewed:   CBC: Recent Labs  Lab 12/28/19 1628 12/29/19 0238  WBC 9.4 8.5  NEUTROABS 8.0* 6.6  HGB 9.3* 8.9*  HCT 30.5* 30.4*  MCV 94.4 97.4  PLT 139* 109*   Basic Metabolic Panel: Recent Labs  Lab 12/28/19 1628 12/29/19 0238  NA 137 137  K 4.5 4.4  CL 101 100    CO2 27 29  GLUCOSE 157* 178*  BUN 69* 69*  CREATININE 3.00* 2.84*  CALCIUM 8.8* 8.7*  MG 1.8 1.8  PHOS  --  3.3   GFR: Estimated Creatinine Clearance: 47.9 mL/min (A) (by C-G formula based on SCr of 2.84 mg/dL (H)). Liver Function Tests: Recent Labs  Lab 12/28/19 1628 12/29/19 0238  AST 22 20  ALT 12 13  ALKPHOS 88 85  BILITOT 1.6* 1.9*  PROT 8.2* 8.0  ALBUMIN 3.2* 3.1*   No results for input(s): LIPASE, AMYLASE in the last 168 hours. No results for input(s): AMMONIA in the last 168 hours. Coagulation Profile: No results for input(s): INR, PROTIME in the last 168 hours. Cardiac Enzymes: No results for input(s): CKTOTAL, CKMB, CKMBINDEX, TROPONINI in the last 168 hours. BNP (last 3 results) No results for input(s): PROBNP in the last 8760 hours. HbA1C: Recent Labs    12/29/19 0238  HGBA1C 6.9*   CBG: Recent Labs  Lab 12/28/19 2325 12/29/19 0824  GLUCAP 176* 129*   Lipid Profile: No results for input(s): CHOL, HDL, LDLCALC, TRIG, CHOLHDL, LDLDIRECT in the last 72 hours. Thyroid Function Tests: Recent Labs    12/29/19 0238  TSH 2.233   Anemia Panel: Recent Labs    12/28/19 1628  FERRITIN 210   Urine analysis:    Component Value Date/Time   COLORURINE YELLOW 12/28/2019 1711   APPEARANCEUR HAZY (A) 12/28/2019 1711   LABSPEC 1.012 12/28/2019 1711   PHURINE 5.0 12/28/2019 1711   GLUCOSEU NEGATIVE 12/28/2019 1711   HGBUR MODERATE (A) 12/28/2019 1711   BILIRUBINUR NEGATIVE 12/28/2019 1711   KETONESUR NEGATIVE 12/28/2019 1711   PROTEINUR NEGATIVE 12/28/2019 1711   UROBILINOGEN 1.0 05/16/2013 1758   NITRITE NEGATIVE 12/28/2019 1711   LEUKOCYTESUR LARGE (A) 12/28/2019 1711   Sepsis Labs: @LABRCNTIP (procalcitonin:4,lacticidven:4)  ) Recent Results (from the past 240 hour(s))  SARS CORONAVIRUS 2 (TAT 6-24 HRS) Nasopharyngeal Nasopharyngeal Swab     Status: None   Collection Time: 12/28/19  6:12 PM   Specimen: Nasopharyngeal Swab  Result Value Ref  Range Status   SARS Coronavirus 2 NEGATIVE NEGATIVE Final    Comment: (NOTE) SARS-CoV-2 target nucleic acids are NOT DETECTED. The SARS-CoV-2 RNA is generally detectable in upper and lower respiratory specimens during the acute phase of infection. Negative results do not preclude SARS-CoV-2 infection, do not rule out co-infections with other pathogens, and should not be used as the sole basis for treatment or other patient management decisions. Negative results must be combined with clinical observations, patient history, and epidemiological information. The expected result is Negative. Fact Sheet for Patients: SugarRoll.be Fact Sheet for Healthcare Providers: https://www.woods-mathews.com/ This test is not yet approved or cleared by the [place] FDA and  has been authorized for detection and/or diagnosis of SARS-CoV-2 by FDA under an Emergency Use Authorization (EUA). This EUA will remain  in effect (meaning this test can be used) for the duration  of the COVID-19 declaration under Section 56 4(b)(1) of the Act, 21 U.S.C. section 360bbb-3(b)(1), unless the authorization is terminated or revoked sooner. Performed at [facility], [place] [address] [place], [place] [address]   MRSA PCR Screening     Status: None   Collection Time: 12/28/19 10:44 PM   Specimen: Nasal Mucosa; Nasopharyngeal  Result Value Ref Range Status   MRSA by PCR NEGATIVE NEGATIVE Final    Comment:        The GeneXpert MRSA Assay (FDA approved for NASAL specimens only), is one component of a comprehensive MRSA colonization surveillance program. It is not intended to diagnose MRSA infection nor to guide or monitor treatment for MRSA infections. Performed at [facility], [place] [address], [place], [place] [address]          Radiology Studies: DG Chest Portable 1 View  Result Date: 12/28/2019 CLINICAL DATA:  Fever and sepsis EXAM:  PORTABLE CHEST 1 VIEW COMPARISON:  December 18, 2019 FINDINGS: The heart size and mediastinal contours are unchanged with cardiomegaly. There is prominence of the central pulmonary vasculature. A probable trace left pleural effusion is present. No large airspace consolidation. No acute osseous findings. IMPRESSION: Cardiomegaly and pulmonary vascular congestion. Probable trace left pleural effusion. Electronically Signed   By: Prudencio Pair M.D.   On: 12/28/2019 18:57        Scheduled Meds: . Chlorhexidine Gluconate Cloth  6 each Topical Daily  . docusate sodium  100 mg Oral BID  . enoxaparin (LOVENOX) injection  30 mg Subcutaneous QHS  . furosemide  40 mg Oral BID  . insulin aspart  0-5 Units Subcutaneous QHS  . insulin aspart  0-9 Units Subcutaneous TID WC  . mouth rinse  15 mL Mouth Rinse BID  . nicotine  21 mg Transdermal Daily  . sodium chloride flush  3 mL Intravenous Q12H  . sodium chloride flush  3 mL Intravenous Q12H   Continuous Infusions: . sodium chloride 10 mL/hr at 12/29/19 0500  . ceFEPime (MAXIPIME) IV Stopped (12/29/19 0559)  . [START ON 12/30/2019] vancomycin       LOS: 0 days    Time spent: 18min    Domenic Polite, MD [facility]  12/29/2019, 11:36 AM

## 2019-12-29 NOTE — Progress Notes (Signed)
PHARMACY - PHYSICIAN COMMUNICATION CRITICAL VALUE ALERT - BLOOD CULTURE IDENTIFICATION (BCID)  [name] is an 54 y.o. female who presented to [facility] on 12/28/2019 with a chief complaint of fever  Assessment: Pt currently on antibiotics for sepsis, likely catheter associated UTI. BCID 1/4 + GNR, identified as pseudomonas aeruginosa.   Name of physician (or Provider) Contacted: Dr. Broadus John  Current antibiotics: Cefepime  Changes to prescribed antibiotics recommended:  No changes recommended at this time, continue cefepime.   Results for orders placed or performed during the hospital encounter of 12/28/19  Blood Culture ID Panel (Reflexed) (Collected: 12/28/2019  5:11 PM)  Result Value Ref Range   Enterococcus species NOT DETECTED NOT DETECTED   Listeria monocytogenes NOT DETECTED NOT DETECTED   Staphylococcus species NOT DETECTED NOT DETECTED   Staphylococcus aureus (BCID) NOT DETECTED NOT DETECTED   Streptococcus species NOT DETECTED NOT DETECTED   Streptococcus agalactiae NOT DETECTED NOT DETECTED   Streptococcus pneumoniae NOT DETECTED NOT DETECTED   Streptococcus pyogenes NOT DETECTED NOT DETECTED   Acinetobacter baumannii NOT DETECTED NOT DETECTED   Enterobacteriaceae species NOT DETECTED NOT DETECTED   Enterobacter cloacae complex NOT DETECTED NOT DETECTED   Escherichia coli NOT DETECTED NOT DETECTED   Klebsiella oxytoca NOT DETECTED NOT DETECTED   Klebsiella pneumoniae NOT DETECTED NOT DETECTED   Proteus species NOT DETECTED NOT DETECTED   Serratia marcescens NOT DETECTED NOT DETECTED   Carbapenem resistance NOT DETECTED NOT DETECTED   Haemophilus influenzae NOT DETECTED NOT DETECTED   Neisseria meningitidis NOT DETECTED NOT DETECTED   Pseudomonas aeruginosa DETECTED (A) NOT DETECTED   Candida albicans NOT DETECTED NOT DETECTED   Candida glabrata NOT DETECTED NOT DETECTED   Candida krusei NOT DETECTED NOT DETECTED   Candida parapsilosis NOT DETECTED NOT DETECTED    Candida tropicalis NOT DETECTED NOT DETECTED    Lenis Noon, PharmD 12/29/2019  4:26 PM

## 2019-12-29 NOTE — Progress Notes (Signed)
Initial Nutrition Assessment  RD working remotely.   DOCUMENTATION CODES:   Morbid obesity  INTERVENTION:  - will order Ensure Max BID, each supplement provides 150 kcal and 30 grams protein. - will order 30 mL Prostat BID, each supplement provides 100 kcal and 15 grams of protein.   NUTRITION DIAGNOSIS:   Increased nutrient needs related to acute illness as evidenced by estimated needs.  GOAL:   Patient will meet greater than or equal to 90% of their needs  MONITOR:   PO intake, Supplement acceptance, Labs, Weight trends  REASON FOR ASSESSMENT:   Malnutrition Screening Tool, Consult Malnutrition Eval  ASSESSMENT:   54 year old female with medical history of morbid obesity, chronic systolic heart failure/NICM, DM, and stage 3-4 CKD. She was hospitalized at [facility] 2/23-3/8 with anasarca, worsening CHF, cardiorenal syndrome, and progressive renal failure. At that time, she was determined to not be a dialysis candidate. She was seen by Palliative Care and was subsequently discharged home with home hospice. She presented to the ED due to fever and weakness. CXR indicates pulmonary vascular congestion and urinalysis showed pyuria and bacteriuria with assumed CAUTI.  She consumed 100% of breakfast this AM (468 kcal and 10 grams protein). Per chart review, weight yesterday was recorded as 480 lb. This is the highest weight recorded in at least 13 months.   She has not been seen by a [facility] RD at any time in the past. During admission in early January a RD was unable to connect with patient despite multiple attempts.   Per notes: - acute on chronic CHF with anasarca - stage 4 CKD - acute on chronic hypoxic and hypercarbic respiratory failure - sepsis thought to be 2/2 CAUTI - chronic pain syndrome - wishes to be Full Code despite on hospice at home   Labs reviewed; CBGs: 129 and 158 mg/dl, BUN: 69 mg/dl, creatinine: 2.84 mg/dl, Ca: 8.7 mg/dl, GFR: 18  ml/min. Medications reviewed; 100 mg colace BID, 60 mg IV lasix BID, sliding scale novolog.     NUTRITION - FOCUSED PHYSICAL EXAM:  unable to complete at this time.   Diet Order:   Diet Order            Diet Carb Modified Fluid consistency: Thin; Room service appropriate? Yes  Diet effective now              EDUCATION NEEDS:   Not appropriate for education at this time  Skin:  Skin Assessment: Skin Integrity Issues: Skin Integrity Issues:: Other (Comment) Other: wound to R leg  Last BM:  4/12  Height:   Ht Readings from Last 1 Encounters:  12/28/19 5\' 3"  (1.6 m)    Weight:   Wt Readings from Last 1 Encounters:  12/28/19 (!) 248.6 kg    Ideal Body Weight:  52.3 kg  BMI:  Body mass index is 97.07 kg/m.  Estimated Nutritional Needs:   Kcal:  2400-2600 kcal  Protein:  120-135 grams  Fluid:  >/= 1.8 L/day     Jarome Matin, MS, RD, LDN, CNSC Inpatient Clinical Dietitian RD pager # available in [facility]  After hours/weekend pager # available in [facility]

## 2019-12-29 NOTE — Evaluation (Signed)
Occupational Therapy Evaluation Patient Details Name: [name] MRN: [id number] DOB: 18-May-1966 Today's Date: 12/29/2019    History of Present Illness [name] is a 54 y.o. female with medical history significant of Chronic systolic CHF, HTN, CKD [id number], morbid obesity , diabetes, HLD, HTN, GERD, sleep apnea, diabetic neuropathy chronic respiratory failure on 3 L of O2, admitted with fever, recently long hospitalization, DC'd to home with Hospice.Recently was seen in the ER was seen for SVT on 4 April converted prehospital with adenosine by EMS  was able to be discharged to home from [facility] recently developed significant anasarca and required hemodialysisShe had a prolonged hospital stay and was eventually discharged home on hospice and made DNR/DNI at this point patient denies wanting to be DNR/DNI she would like to be full code and have aggressive care.   Clinical Impression   Patient reports she has been bed bound for past 2 months since prolonged hospitalization at [facility]. Patient reports she has CNA and family assist for all self care and takes +3 for bed mobility. Patient currently with functional limitations due to deficits listed below. Patient would benefit from skilled OT services to maximize independence and safety with self care.    Follow Up Recommendations  Home health OT;Other (comment)(HH services working with pt prior to hospitalization)    Equipment Recommendations  None recommended by OT       Precautions / Restrictions Precautions Precautions: Fall Restrictions Weight Bearing Restrictions: No      Mobility Bed Mobility Overal bed mobility: Needs Assistance Bed Mobility: Rolling           General bed mobility comments: +3 to roll to each side, more easily rolls to left,  bed pads changed. patient could use RUE to pull on head board to slide up in bed with 3 assisting.  Transfers                 General transfer comment: will need  maxi sky 1000# or Tenor- Unsure of exact weight        ADL either performed or assessed with clinical judgement   ADL Overall ADL's : Needs assistance/impaired Eating/Feeding: Set up;Bed level   Grooming: Set up;Bed level   Upper Body Bathing: Maximal assistance;Total assistance;Bed level   Lower Body Bathing: Total assistance;Bed level   Upper Body Dressing : Maximal assistance;Total assistance;Bed level   Lower Body Dressing: Total assistance;Bed level     Toilet Transfer Details (indicate cue type and reason): patient has not been out of bed in 2 months, requires +3 for bed mobility Toileting- Clothing Manipulation and Hygiene: Total assistance;Bed level       Functional mobility during ADLs: Maximal assistance(+3) General ADL Comments: Patient has CNA and assist from daughters for self care, has been bed bound for past 2 months requiring max to total A x3                  Pertinent Vitals/Pain Pain Assessment: Faces Faces Pain Scale: Hurts even more Pain Location: back legs Pain Descriptors / Indicators: Aching;Discomfort Pain Intervention(s): Monitored during session     Hand Dominance Right   Extremity/Trunk Assessment Upper Extremity Assessment Upper Extremity Assessment: Generalized weakness   Lower Extremity Assessment Lower Extremity Assessment: Defer to PT evaluation RLE Deficits / Details: can rotate leg, does not flex hip or knee against gravity. Requires  assist to lift leg to roll to right, has active Dosris/plantarflexion, more edema in the leg than left LLE Deficits /  Details: left leg is positioned in ER, unable to rotate even to neutral, active dosi/plantar flex       Communication Communication Communication: No difficulties   Cognition Arousal/Alertness: Awake/alert Behavior During Therapy: WFL for tasks assessed/performed Overall Cognitive Status: Within Functional Limits for tasks assessed                                                 Home Living Family/patient expects to be discharged to:: Private residence Living Arrangements: Children Available Help at Discharge: Family;Personal care attendant Type of Home: [place]: One level     Bathroom Shower/Tub: Teacher, early years/pre: [place]: Environmental consultant - 2 wheels;Wheelchair - power;Hospital bed          Prior Functioning/Environment Level of Independence: Needs assistance  Gait / Transfers Assistance Needed: non ambulatory,  has been bedbound for 2 months, 3 persons to roll and wash patient up ADL's / Homemaking Assistance Needed: pt has an aide that comes everday for ~5 hrs who assists her with ADLs            OT Problem List: Decreased strength;Decreased activity tolerance;Obesity      OT Treatment/Interventions: Self-care/ADL training;Therapeutic exercise;Therapeutic activities;Patient/family education;Balance training    OT Goals(Current goals can be found in the care plan section) Acute Rehab OT Goals Patient Stated Goal: to be able to get OOB OT Goal Formulation: With patient Time For Goal Achievement: 01/12/20 Potential to Achieve Goals: Fair  OT Frequency: Min 2X/week           Co-evaluation PT/OT/SLP Co-Evaluation/Treatment: Yes Reason for Co-Treatment: Complexity of the patient's impairments (multi-system involvement);For patient/therapist safety;To address functional/ADL transfers   OT goals addressed during session: ADL's and self-care      AM-PAC OT "6 Clicks" Daily Activity     Outcome Measure Help from another person eating meals?: A Little Help from another person taking care of personal grooming?: A Little Help from another person toileting, which includes using toliet, bedpan, or urinal?: Total Help from another person bathing (including washing, rinsing, drying)?: Total Help from another person to put on and taking off regular  upper body clothing?: Total Help from another person to put on and taking off regular lower body clothing?: Total 6 Click Score: 10   End of Session Equipment Utilized During Treatment: Oxygen Nurse Communication: Mobility status  Activity Tolerance: Patient tolerated treatment well Patient left: in bed;with call bell/phone within reach  OT Visit Diagnosis: Other abnormalities of gait and mobility (R26.89);Muscle weakness (generalized) (M62.81)                Time: 1610-9604 OT Time Calculation (min): 36 min Charges:  OT General Charges $OT Visit: 1 Visit OT Evaluation $OT Eval Moderate Complexity: [facility] OT Pager: [place] 12/29/2019, 1:33 PM

## 2019-12-29 NOTE — Consult Note (Signed)
CARDIOLOGY CONSULT NOTE  Patient ID: [name] MRN: [id number] DOB/AGE: Dec 27, 1965 54 y.o.  Admit date: 12/28/2019 Referring Physician: Domenic Polite, MD Primary Physician:  Carrie Drafts, FNP  Primary cardiologist: Dr. Vernell Leep Cardiology consultant: Carrie Kras, DO, [facility]  Reason for Consultation: Congestive heart failure  HPI:  [name] is a 54 y.o. female who presents with a chief complaint of " fevers." Her past medical history and cardiovascular risk factors include: Chronic systolic heart failure, hypertension, chronic kidney disease, morbid obesity (BMI 85), diabetes, hyperlipidemia, GERD, sleep apnea, chronic respiratory failure.  Patient presents to the hospital with a chief complaint of fevers, chills, weakness.  She was also tachycardic on arrival. On admission her white count was 9.4, hemoglobin 9.3 g/dL, platelets 139 CRP elevated at 2, lactic acid at 1.4, BNP of 1125, high sensitive troponin 24, serum creatinine 3 mg/dL, EGFR 17, magnesium 1.8, potassium 4.5.  Patient was started on broad-spectrum antibiotics as urinalysis showed pyuria and bacteriuria suggestive of catheter associated infection.  Patient had an extensive hospitalization stay from November 08, 2019 through November 21, 2019 at [facility] given her underlying anasarca, heart failure with reduced EF, cardiorenal syndrome, and progressive renal failure.  She was seen by nephrology and was not appropriate dialysis candidate.  She subsequently was enrolled into hospice.  Cardiology is consulted for management of congestive heart failure.  Patient's BNP is elevated at 1125, her serum high-sensitivity troponin levels are elevated but flat, chest x-ray noted for pulmonary vascular congestion, serum creatinine 3 mg/dL (on admit) and 2.74m/dL today.  Patient denies any chest pain at rest.  No worsening shortness of breath.  Patient is currently on nasal cannula oxygen at home approximately 2-3 liters  and has not required additional supplemental oxygen.  Patient states that when her daughters and her home aide help assist cleaning her they have noticed that her legs do weight less and is much easier to clean her.  She is adamant that she has lost significant amount of fluid weight.   Unable to assess if she has chest pain or shortness of breath or cough related activities as she is pretty sedentary given her underlying obesity.  ALLERGIES: No Known Allergies  PAST MEDICAL HISTORY: Past Medical History:  Diagnosis Date  . Arthritis    "hands, arms, feet, back, knees" (02/17/2018)  . Cardiomyopathy ([facility]    From Dr. PBonney Rousseloffice notes from 01/13/18  . CHF (congestive heart failure) ([place]   . Chronic lower back pain   . Diabetic neuropathy ([place]   . Family history of adverse reaction to anesthesia    Mother is hard to arouse and blood pressure drops  . GERD (gastroesophageal reflux disease)   . High cholesterol   . Hypertension   . Sleep apnea    "need new machine"  - does not use a cpap (02/17/2018)  . Type II diabetes mellitus ([place]    no longer on medications (02/17/2018)    PAST SURGICAL HISTORY: Past Surgical History:  Procedure Laterality Date  . ANTERIOR CERVICAL DECOMP/DISCECTOMY FUSION  03/06/2010   C4-7 w/corpectomy/notes 03/17/2010  . BACK SURGERY    . CARPAL TUNNEL RELEASE Right   . HARDWARE REVISION  04/10/2010   cervical hardware revision screw replacement C4/notes 04/12/2010  . JOINT REPLACEMENT    . LAPAROSCOPIC CHOLECYSTECTOMY  1999  . LUMBAR LAMINECTOMY/DECOMPRESSION MICRODISCECTOMY  09/2010   /Archie Endo1/21/2012  . MULTIPLE EXTRACTIONS WITH ALVEOLOPLASTY [name] 01/19/2015   Procedure: MULTIPLE EXTRACTIONS Three, Four, Five,  Seven, eight, nine, ten, eleven, twelve, fourteen, eighteen, twenty-one, twenty-eight, twenty-nine, thirty-one with Alveoloplasty.  ;  Surgeon: Diona Browner, DDS;  Location: [place];  Service: Oral Surgery;  Laterality: [name];  . REPLACEMENT TOTAL  KNEE BILATERAL Bilateral 10/2007-03/2008   "left-right"  . TOENAIL EXCISION Bilateral    great toes  . TONSILLECTOMY    . TOTAL KNEE REVISION Right 02/17/2018  . TOTAL KNEE REVISION Right 02/17/2018   Procedure: RIGHT TOTAL KNEE REVISION;  Surgeon: Newt Minion, MD;  Location: [place];  Service: Orthopedics;  Laterality: Right;  . TOTAL KNEE REVISION Right 05/14/2018   Procedure: RIGHT TOTAL KNEE REVISION TO HINGED KNEE;  Surgeon: Newt Minion, MD;  Location: [place];  Service: Orthopedics;  Laterality: Right;    FAMILY HISTORY: The patient family history includes Congestive Heart Failure in her mother; Hypertension in her father; Kidney disease in her mother.   SOCIAL HISTORY:  The patient  reports that she has been smoking cigarettes. She has a 16.00 pack-year smoking history. She has never used smokeless tobacco. She reports previous alcohol use. She reports previous drug use. Drug: Marijuana.  MEDICATIONS: Medications Prior to Admission  Medication Sig Dispense Refill Last Dose  . albuterol (PROVENTIL) (2.5 MG/3ML) 0.083% nebulizer solution Take 2.5 mg by nebulization every 4 (four) hours as needed for wheezing.    Past Week at Unknown time  . allopurinol (ZYLOPRIM) 300 MG tablet Take 300 mg by mouth daily.   12/27/2019 at Unknown time  . ALPRAZolam (XANAX) 0.25 MG tablet Take 0.25 mg by mouth 2 (two) times daily as needed for anxiety.    Past Month at Unknown time  . Baclofen 5 MG TABS Take 1 tablet by mouth 3 (three) times daily as needed for muscle spasms.   12/27/2019 at Unknown time  . furosemide (LASIX) 40 MG tablet Take 1 tablet (40 mg total) by mouth 2 (two) times daily. 60 tablet 0 12/28/2019 at Unknown time  . isosorbide-hydrALAZINE (BIDIL) 20-37.5 MG tablet Take 1 tablet by mouth 2 times daily at 12 noon and 4 pm.   12/28/2019 at Unknown time  . metoprolol succinate (TOPROL-XL) 25 MG 24 hr tablet TAKE 1 TABLET(25 MG) BY MOUTH DAILY WITH OR IMMEDIATELY FOLLOWING A MEAL (Patient taking  differently: Take 25 mg by mouth daily. ) 90 tablet 0 12/28/2019 at 0700  . NARCAN 4 MG/0.1ML LIQD nasal spray kit Place 0.4 mg into the nose as needed (for overdose).    unk at [employer]  . oxyCODONE-acetaminophen (PERCOCET) 10-325 MG tablet Take 1 tablet by mouth every 8 (eight) hours as needed for pain. (Patient taking differently: Take 1 tablet by mouth in the morning, at noon, in the evening, and at bedtime. ) 28 tablet 0 12/28/2019 at Unknown time  . OZEMPIC, 0.25 OR 0.5 MG/DOSE, 2 MG/1.5ML SOPN Inject 0.25 mg into the skin every Wednesday.   12/21/2019  . polyethylene glycol (MIRALAX / GLYCOLAX) 17 g packet Take 17 g by mouth daily as needed for mild constipation.   Past Week at Unknown time  . senna-docusate (SENOKOT-S) 8.6-50 MG tablet Take 2 tablets by mouth at bedtime as needed for mild constipation or moderate constipation. (Patient not taking: Reported on 12/18/2019) 10 tablet 0     Review of Systems  Constitution: Positive for chills (on admission, now improving). Negative for fever.  HENT: Negative for ear discharge, ear pain and nosebleeds.   Eyes: Negative for blurred vision and discharge.  Cardiovascular: Positive for leg swelling. Negative  for chest pain, claudication, dyspnea on exertion, near-syncope, orthopnea, palpitations, paroxysmal nocturnal dyspnea and syncope.  Respiratory: Positive for shortness of breath (chronic and stable). Negative for cough.   Endocrine: Negative for polydipsia, polyphagia and polyuria.  Hematologic/Lymphatic: Negative for bleeding problem.  Skin: Negative for flushing and nail changes.  Musculoskeletal: Negative for muscle cramps, muscle weakness and myalgias.  Gastrointestinal: Negative for abdominal pain, dysphagia, hematemesis, hematochezia, melena, nausea and vomiting.  Neurological: Negative for dizziness, focal weakness and light-headedness.  Psychiatric/Behavioral: Negative for altered mental status and suicidal ideas.   PHYSICAL EXAM: Vitals with  BMI 12/29/2019 12/29/2019 12/29/2019  Height - - -  Weight - - -  BMI - - -  Systolic 616 - -  Diastolic 75 - -  Pulse 073 102 102    CONSTITUTIONAL: Appears older than stated age, morbidly obese, no acute distress, hemodynamically stable.  SKIN: Skin is warm and dry. No rash noted. No cyanosis. No pallor. No jaundice HEAD: Normocephalic and atraumatic.  EYES: No scleral icterus MOUTH/THROAT: Dry oral membranes.  NECK: Unable to evaluate JVP given the body habitus. LYMPHATIC: No visible cervical adenopathy.  CHEST Normal respiratory effort. No intercostal retractions  LUNGS: Clear to auscultation laterally.  Patient unable to sit upright to auscultate posteriorly.  No stridor. No wheezes. No rales.  CARDIOVASCULAR: Tachycardic, positive X1-G6, systolic murmur heard over the left sternal border, no gallops or rubs. ABDOMINAL: Morbidly obese, tender over the left lower quadrant abdomen, nondistended, positive bowel sounds all 4 quadrants.  EXTREMITIES: Bilateral peripheral edema, skin changes secondary to venous stasis, warm to touch. HEMATOLOGIC: No significant bruising NEUROLOGIC: Oriented to person, place, and time. Nonfocal. Normal muscle tone.  PSYCHIATRIC: Normal mood and affect. Normal behavior. Cooperative  RADIOLOGY: DG Chest Portable 1 View  Result Date: 12/28/2019 CLINICAL DATA:  Fever and sepsis EXAM: PORTABLE CHEST 1 VIEW COMPARISON:  December 18, 2019 FINDINGS: The heart size and mediastinal contours are unchanged with cardiomegaly. There is prominence of the central pulmonary vasculature. A probable trace left pleural effusion is present. No large airspace consolidation. No acute osseous findings. IMPRESSION: Cardiomegaly and pulmonary vascular congestion. Probable trace left pleural effusion. Electronically Signed   By: Prudencio Pair M.D.   On: 12/28/2019 18:57    LABORATORY DATA: Lab Results  Component Value Date   WBC 8.5 12/29/2019   HGB 8.9 (L) 12/29/2019   HCT 30.4 (L)  12/29/2019   MCV 97.4 12/29/2019   PLT 132 (L) 12/29/2019    Recent Labs  Lab 12/29/19 0238  NA 137  K 4.4  CL 100  CO2 29  BUN 69*  CREATININE 2.84*  CALCIUM 8.7*  PROT 8.0  BILITOT 1.9*  ALKPHOS 85  ALT 13  AST 20  GLUCOSE 178*   BNP (last 3 results) Recent Labs    10/15/19 2252 11/07/19 2145 12/28/19 1628  BNP 562.8* 584.6* 1,125.2*    HEMOGLOBIN A1C Lab Results  Component Value Date   HGBA1C 6.9 (H) 12/29/2019   MPG 151.33 12/29/2019   Cardiac Panel (last 3 results) Recent Labs    12/28/19 1628  TROPONINIHS 24*     Lab Results  Component Value Date   CKTOTAL 59 10/02/2008   CKMB 1.1 10/02/2008     TSH Recent Labs    12/29/19 0238  TSH 2.233     Scheduled Meds: . Chlorhexidine Gluconate Cloth  6 each Topical Daily  . docusate sodium  100 mg Oral BID  . enoxaparin (LOVENOX) injection  40 mg Subcutaneous  QHS  . feeding supplement (PRO-STAT SUGAR FREE 64)  30 mL Oral BID BM  . furosemide  60 mg Intravenous Q12H  . insulin aspart  0-5 Units Subcutaneous QHS  . insulin aspart  0-9 Units Subcutaneous TID WC  . mouth rinse  15 mL Mouth Rinse BID  . metoprolol succinate  25 mg Oral Daily  . nicotine  21 mg Transdermal Daily  . Ensure Max Protein  11 oz Oral BID  . sodium chloride flush  3 mL Intravenous Q12H  . sodium chloride flush  3 mL Intravenous Q12H   Continuous Infusions: . sodium chloride 10 mL/hr at 12/29/19 0500  . ceFEPime (MAXIPIME) IV Stopped (12/29/19 0559)   PRN Meds:.sodium chloride, acetaminophen **OR** acetaminophen, ALPRAZolam, ondansetron **OR** ondansetron (ZOFRAN) IV, oxyCODONE-acetaminophen **AND** [DISCONTINUED] oxyCODONE, sodium chloride flush  CARDIAC DATABASE: EKG: 12/28/2019: Sinus tachycardia ventricular rate of 115 bpm, poor R wave progression, nonspecific IVCD.  Echocardiogram: 11/20/2019: LVEF 20-25%, severe global hypokinesis with abnormal septal wall motion due to left bundle branch block, grade 2 diastolic  impairment mildly dilated right atrium, reported mild aortic stenosis (peak velocity 2.3 m/s, mean gradient 9 mmHg, aortic valve area 2.3cm) moderate TR, PASP 60 mmHg consistent with moderate pulmonary hypertension.  Compared to prior study dated 08/2019 LVEF is reduced from 35-40% to 20-25%   IMPRESSION& RECOMMENDATIONS: ANNALY SKOP is a 54 y.o. female whose complex past medical history and cardiovascular risk factors include: Chronic systolic heart failure, cardiomyopathy, hypertension, chronic kidney disease, morbid obesity (BMI 97), diabetes, hyperlipidemia, GERD, sleep apnea, chronic respiratory failure on home oxygen.  Acute on chronic heart failure with reduced EF, stage C, NYHA class III:  Ejection Fraction noted on last 2D Echo  Recommend daily weight check, strict I/O's  Fluid restriction to <2L per day, Na restriction < 1.5g per day  Patient's BNP is elevated at 1125, chest x-ray noted for pulmonary vascular congestion, lower extremity swelling.  Continue home dose beta-blocker therapy, Toprol-XL 25 mg p.o. daily.  Currently on IV Lasix 60 mg twice daily.  Hold ACE inhibitors or ARB's or Arni's to avoid worsening kidney function for now.  However needs to be reconsidered prior to discharge.  Start metolazone 2.5 mg p.o. daily  Consider nephrology consult as her kidney function is improving to help facilitate diuresis.  Echocardiogram results pending  Avoid nephrotoxic agents.  Keep potassium at 4 and magnesium at 2.  Continue telemetry.  Chronic kidney disease stage IV:  Continue IV diuresis given acute on chronic heart failure with reduced EF.  Patient has a component of cardiorenal syndrome and may benefit from nephrology consultation to help facilitate diuresis.  Will defer to primary team for now.  Hyperlipidemia: Check fasting lipid profile.  Elevated troponin x1: Most likely secondary to supply demand ischemia.  Patient denies any active chest pain.   No evidence of myocardial injury pattern on EKG.  Diabetes mellitus type 2: Currently managed by primary team  Sepsis, most likely secondary to catheter associated urinary tract infection: Defer to primary team  Patient's questions and concerns were addressed to her satisfaction. She voices understanding of the instructions provided during this encounter.   This note was created using a voice recognition software as a result there may be grammatical errors inadvertently enclosed that do not reflect the nature of this encounter. Every attempt is made to correct such errors.  Carrie Kras, DO, [facility]. [place] Office: [phone] 12/29/2019, 6:02 PM

## 2019-12-29 NOTE — Plan of Care (Signed)
  Problem: Education: Goal: Knowledge of General Education information will improve Description: Including pain rating scale, medication(s)/side effects and non-pharmacologic comfort measures 12/29/2019 0148 by Linward Headland, RN Outcome: Progressing 12/29/2019 0148 by Linward Headland, RN Outcome: Progressing 12/29/2019 0148 by Linward Headland, RN Outcome: Progressing

## 2019-12-29 NOTE — Evaluation (Signed)
Physical Therapy Evaluation Patient Details Name: [name] MRN: [id number] DOB: [birth year] Today's Date: 12/29/2019   History of Present Illness  [name] is a 55 y.o. female with medical history significant of Chronic systolic CHF, HTN, CKD HQION6E, morbid obesity , diabetes, HLD, HTN, GERD, sleep apnea, diabetic neuropathy chronic respiratory failure on 3 L of O2, admitted with fever, recently long hospitalization, DC'd to home with Hospice/DNR.Marland KitchenRecently was seen in the ER was seen for SVT on 4 April converted and   was able to be discharged to home from ER. At this point patient denies wanting to be DNR/DNI she would like to be full code and have aggressive care.  Clinical Impression  The patient reports bed bound for 2 months( long stay at [facility] in 2-11/2019. Patient reports family support for ADLs' Does not have a lift or WC. Patient  Currently will require mechanical lift for OOB, 1000# lift as  Patient's weight is documented > 500#. Pt admitted with above diagnosis.  Pt currently with functional limitations due to the deficits listed below (see PT Problem List). Pt will benefit from skilled PT to increase their independence and safety with mobility to allow discharge to the venue listed below.       Follow Up Recommendations Home health PT(HHPT is on board)    Equipment Recommendations  Wheelchair (measurements PT)(Pt. requesting, needs a lift also)    Recommendations for Other Services       Precautions / Restrictions Precautions Precautions: Fall      Mobility  Bed Mobility Overal bed mobility: Needs Assistance Bed Mobility: Rolling           General bed mobility comments: +3 to roll to each side, more rweadily rolls to left, ? due to lack of LLE hip rotation., bed pads changed. patient could use RUE to pull on head board to slide up in bed with 3 assisting.  Transfers                 General transfer comment: will need maxi sky 1000# or Tenor- Unsure  of exact weight  Ambulation/Gait                Stairs            Wheelchair Mobility    Modified Rankin (Stroke Patients Only)       Balance                                             Pertinent Vitals/Pain Pain Assessment: Faces Faces Pain Scale: Hurts even more Pain Location: back legs Pain Descriptors / Indicators: Aching;Discomfort Pain Intervention(s): RN gave pain meds during session;Monitored during session;Limited activity within patient's tolerance    Home Living Family/patient expects to be discharged to:: Private residence Living Arrangements: Children Available Help at Discharge: Family;Personal care attendant   Home Access: Ramped entrance     Home Layout: One level Home Equipment: Environmental consultant - 2 wheels;Wheelchair - power;Other (comment)      Prior Function Level of Independence: Needs assistance   Gait / Transfers Assistance Needed: non ambulatory,  has been bedbound for 2 months, 3 persons to roll and wash patient up  ADL's / Homemaking Assistance Needed: pt has an aide that comes everday for ~5 hrs who assists her with ADLs        Hand Dominance  Extremity/Trunk Assessment        Lower Extremity Assessment Lower Extremity Assessment: LLE deficits/detail;RLE deficits/detail RLE Deficits / Details: can rotate leg, does not flex hip or knee against gravity. Requires  assist to lift leg to roll to right, has active Dosris/plantarflexion, more edema in the leg than left LLE Deficits / Details: left leg is positioned in ER, unable to rotate even to neutral, active dosi/plantar flex       Communication      Cognition Arousal/Alertness: Awake/alert Behavior During Therapy: WFL for tasks assessed/performed Overall Cognitive Status: Within Functional Limits for tasks assessed                                        General Comments      Exercises     Assessment/Plan    PT Assessment  Patient needs continued PT services  PT Problem List Decreased strength;Decreased knowledge of precautions;Decreased range of motion;Decreased mobility;Decreased activity tolerance       PT Treatment Interventions Functional mobility training;Therapeutic activities;Therapeutic exercise;Patient/family education    PT Goals (Current goals can be found in the Care Plan section)  Acute Rehab PT Goals Patient Stated Goal: to be able to get OOB PT Goal Formulation: With patient Time For Goal Achievement: 01/12/20 Potential to Achieve Goals: Fair    Frequency Min 2X/week   Barriers to discharge        Co-evaluation               AM-PAC PT "6 Clicks" Mobility  Outcome Measure Help needed turning from your back to your side while in a flat bed without using bedrails?: Total Help needed moving from lying on your back to sitting on the side of a flat bed without using bedrails?: Total Help needed moving to and from a bed to a chair (including a wheelchair)?: Total Help needed standing up from a chair using your arms (e.g., wheelchair or bedside chair)?: Total Help needed to walk in hospital room?: Total Help needed climbing 3-5 steps with a railing? : Total 6 Click Score: 6    End of Session   Activity Tolerance: Patient tolerated treatment well Patient left: in bed;with call bell/phone within reach Nurse Communication: Need for lift equipment;Mobility status(Tenor or 1000# maxisky, weight ? @ 547#) PT Visit Diagnosis: Muscle weakness (generalized) (M62.81)    Time: 3295-1884 PT Time Calculation (min) (ACUTE ONLY): 36 min   Charges:   PT Evaluation $PT Eval Moderate Complexity: [place] Pager [phone] Office [phone]   Claretha Cooper 12/29/2019, 1:01 PM

## 2019-12-30 ENCOUNTER — Inpatient Hospital Stay (HOSPITAL_COMMUNITY)

## 2019-12-30 DIAGNOSIS — N184 Chronic kidney disease, stage 4 (severe): Secondary | ICD-10-CM | POA: Diagnosis not present

## 2019-12-30 DIAGNOSIS — I5023 Acute on chronic systolic (congestive) heart failure: Secondary | ICD-10-CM | POA: Diagnosis not present

## 2019-12-30 DIAGNOSIS — N390 Urinary tract infection, site not specified: Secondary | ICD-10-CM | POA: Diagnosis not present

## 2019-12-30 DIAGNOSIS — R6510 Systemic inflammatory response syndrome (SIRS) of non-infectious origin without acute organ dysfunction: Secondary | ICD-10-CM | POA: Diagnosis not present

## 2019-12-30 LAB — BASIC METABOLIC PANEL
Anion gap: 12 (ref 5–15)
BUN: 70 mg/dL — ABNORMAL HIGH (ref 6–20)
CO2: 23 mmol/L (ref 22–32)
Calcium: 8.8 mg/dL — ABNORMAL LOW (ref 8.9–10.3)
Chloride: 100 mmol/L (ref 98–111)
Creatinine, Ser: 2.9 mg/dL — ABNORMAL HIGH (ref 0.44–1.00)
GFR calc Af Amer: 21 mL/min — ABNORMAL LOW (ref >60)
GFR calc non Af Amer: 18 mL/min — ABNORMAL LOW (ref >60)
Glucose, Bld: 165 mg/dL — ABNORMAL HIGH (ref 70–99)
Potassium: 4.6 mmol/L (ref 3.5–5.1)
Sodium: 135 mmol/L (ref 135–145)

## 2019-12-30 LAB — CBC
HCT: 33 % — ABNORMAL LOW (ref 36.0–46.0)
Hemoglobin: 9.7 g/dL — ABNORMAL LOW (ref 12.0–15.0)
MCH: 28.7 pg (ref 26.0–34.0)
MCHC: 29.4 g/dL — ABNORMAL LOW (ref 30.0–36.0)
MCV: 97.6 fL (ref 80.0–100.0)
Platelets: 121 10*3/uL — ABNORMAL LOW (ref 150–400)
RBC: 3.38 MIL/uL — ABNORMAL LOW (ref 3.87–5.11)
RDW: 23 % — ABNORMAL HIGH (ref 11.5–15.5)
WBC: 9.6 10*3/uL (ref 4.0–10.5)
nRBC: 0 % (ref 0.0–0.2)

## 2019-12-30 LAB — URINE CULTURE: Culture: >=100000 — AB

## 2019-12-30 LAB — GLUCOSE, CAPILLARY
Glucose-Capillary: 139 mg/dL — ABNORMAL HIGH (ref 70–99)
Glucose-Capillary: 156 mg/dL — ABNORMAL HIGH (ref 70–99)
Glucose-Capillary: 177 mg/dL — ABNORMAL HIGH (ref 70–99)
Glucose-Capillary: 168 mg/dL — ABNORMAL HIGH (ref 70–99)

## 2019-12-30 LAB — LIPID PANEL
Cholesterol: 73 mg/dL (ref 0–200)
HDL: 19 mg/dL — ABNORMAL LOW (ref >40)
LDL Cholesterol: 46 mg/dL (ref 0–99)
Total CHOL/HDL Ratio: 3.8 RATIO
Triglycerides: 38 mg/dL (ref <150)
VLDL: 8 mg/dL (ref 0–40)

## 2019-12-30 MED ORDER — SODIUM CHLORIDE 0.9 % IV SOLN
2.0000 g | Freq: Two times a day (BID) | INTRAVENOUS | Status: DC
  Administered 2019-12-30 – 2019-12-31 (×3): 2 g via INTRAVENOUS
  Filled 2019-12-30 (×4): qty 2

## 2019-12-30 MED ORDER — ISOSORB DINITRATE-HYDRALAZINE 20-37.5 MG PO TABS
1.0000 | ORAL_TABLET | Freq: Three times a day (TID) | ORAL | Status: DC
  Administered 2019-12-30 – 2019-12-31 (×3): 1 via ORAL
  Filled 2019-12-30 (×5): qty 1

## 2019-12-30 MED ORDER — SALINE SPRAY 0.65 % NA SOLN
1.0000 | NASAL | Status: DC | PRN
  Administered 2019-12-30: 1 via NASAL
  Filled 2019-12-30: qty 44

## 2019-12-30 NOTE — Progress Notes (Signed)
[facility]24 - Manufacturing engineer [facility]) Hospital Liaison Note @ 3pm  Patient was transported to the ED after complaints of malaise and fever.  [name] is under hospice services with a terminal diagnosis of combined heart failure per Dr. Tomasa Hosteller of [facility]. Pt admitted with urosepsis.  This is a related hospital admission.  Visited pt at the bedside, she is alert and oriented and only complains of the bed being uncomfortable.  She states they are "pulling fluid" off of her and she feels better.  She is currently stepdown status, hopefully will d/c next 1-2 days.  V/S:  98.2 oral, 130/88, HR 100, RR 20, SPO2 99% on O2 via  @ 3 lpm I&O:  701/950, noted amber urine in purewick cannister Pertinent labs: BUN 70, Cr 2.90, gfr 21, urine culture + for pseudomonas aeruginosa   IVs/PRNs:  Lasix 60 mg BID, NS @ KVO, cefepime 2 g IV BID-last dose today, merrem 2 g IV BID Diagnostics: Echo on 4/15, LVEF 20% with global hypokinesis   Problem List: -Pseudomonas UTI - chronic foley discontinued, antibiotics as above IV-changed to merrem today, afebrile -Acute on chronic HF- lasix 60 mg BID, Cr nearing baseline, fluid and sodium restriction -CKD stage IV- Cr 2.9 (2 is baseline), gently diuresing, nephrotoxic home medications on hold  D/C planning- return home with hospice support, two daughters live with her [place]- ongoing, pt is hopeful for dialysis but she has been told she is not a candidate, she is a full code IDT- updated Family- offered to call daughters, pt declined and said she updates them   Thank you, Venia Carbon RN, BSN, [place] (in [place]) [phone]

## 2019-12-30 NOTE — Progress Notes (Signed)
Subjective:  Feels well  Objective:  Vital Signs in the last 24 hours: Temp:  [97.7 F (36.5 C)-99 F (37.2 C)] 98.3 F (36.8 C) (04/16 1200) Pulse Rate:  [95-107] 99 (04/16 1224) Resp:  [17-40] 20 (04/16 1224) BP: (111-148)/(55-85) 148/76 (04/16 1224) SpO2:  [92 %-99 %] 99 % (04/16 1224) Weight:  [186.4 kg] 186.4 kg (04/16 1200)  Intake/Output from previous day: 04/15 0701 - 04/16 0700 In: 701.7 [P.O.:600; IV Piggyback:101.7] Out: 950 [Urine:950]  Physical Exam  Constitutional: She is oriented to person, place, and time. She appears well-developed and well-nourished. No distress.  Morbidly obese  HENT:  Head: Normocephalic and atraumatic.  Eyes: Pupils are equal, round, and reactive to light. Conjunctivae are normal.  Neck: No JVD present.  Cardiovascular: Normal rate, regular rhythm and intact distal pulses.  Murmur heard. High-pitched blowing holosystolic murmur is present. Pulmonary/Chest: Effort normal and breath sounds normal. She has no wheezes. She has no rales.  Abdominal: Soft. Bowel sounds are normal. There is no rebound.  Musculoskeletal:        General: Edema (1-2 + b/l) present.  Lymphadenopathy:    She has no cervical adenopathy.  Neurological: She is alert and oriented to person, place, and time. No cranial nerve deficit.  Skin: Skin is warm and dry.  Psychiatric: She has a normal mood and affect.  Nursing note and vitals reviewed.    Lab Results: BMP Recent Labs    12/28/19 1628 12/29/19 0238 12/30/19 0730  NA 137 137 135  K 4.5 4.4 4.6  CL 101 100 100  CO2 27 29 23   GLUCOSE 157* 178* 165*  BUN 69* 69* 70*  CREATININE 3.00* 2.84* 2.90*  CALCIUM 8.8* 8.7* 8.8*  GFRNONAA 17* 18* 18*  GFRAA 20* 21* 21*    CBC Recent Labs  Lab 12/29/19 0238 12/29/19 0238 12/30/19 0730  WBC 8.5   < > 9.6  RBC 3.12*   < > 3.38*  HGB 8.9*   < > 9.7*  HCT 30.4*   < > 33.0*  PLT 132*   < > 121*  MCV 97.4   < > 97.6  MCH 28.5   < > 28.7  MCHC 29.3*   <  > 29.4*  RDW 23.3*   < > 23.0*  LYMPHSABS 0.7  --   --   MONOABS 1.0  --   --   EOSABS 0.0  --   --   BASOSABS 0.0  --   --    < > = values in this interval not displayed.    HEMOGLOBIN A1C Lab Results  Component Value Date   HGBA1C 6.9 (H) 12/29/2019   MPG 151.33 12/29/2019    Results for Carrie, [name] (MRN [id number]) as of 12/30/2019 16:39  Ref. Range 11/08/2019 04:59 12/18/2019 06:56 12/18/2019 09:50 12/28/2019 16:28  Troponin I (High Sensitivity) Latest Ref Range: <18 ng/L 19 (H) 33 (H) 39 (H) 24 (H)   BNP (last 3 results) Recent Labs    10/15/19 2252 11/07/19 2145 12/28/19 1628  BNP 562.8* 584.6* 1,125.2*    TSH Recent Labs    12/29/19 0238  TSH 2.233    Lipid Panel     Component Value Date/Time   CHOL 73 12/30/2019 0237   TRIG 38 12/30/2019 0237   HDL 19 (L) 12/30/2019 0237   CHOLHDL 3.8 12/30/2019 0237   VLDL 8 12/30/2019 0237   LDLCALC 46 12/30/2019 0237     Hepatic Function Panel Recent Labs  11/08/19 0459 11/10/19 1457 11/21/19 0416 12/28/19 1628 12/29/19 0238  PROT 9.0*  --   --  8.2* 8.0  ALBUMIN 3.6   < > 3.1* 3.2* 3.1*  AST 21  --   --  22 20  ALT 16  --   --  12 13  ALKPHOS 83  --   --  88 85  BILITOT 1.3*  --   --  1.6* 1.9*  BILIDIR 0.6*  --   --   --   --   IBILI 0.7  --   --   --   --    < > = values in this interval not displayed.    Cardiac Studies:  EKG 12/29/2019: Sinus tachycardia. RBBB. LAFB. Low voltage.  Echocardiogram 12/29/2019: 1. LVEF < 20%, RVEF at least moderately decreased. Limited study quality.  Cardiac MRI is recommended for further quantification and evaluation of  cardiomyopathy.  2. Left ventricular ejection fraction, by estimation, is <20%. The left  ventricle has severely decreased function. The left ventricle demonstrates  global hypokinesis. The left ventricular internal cavity size was  moderately dilated.  3. Right ventricular systolic function is moderately reduced. The right  ventricular  size is moderately enlarged.  4. The mitral valve was not assessed.  5. The aortic valve was not assessed.   Echocardiogram12/28/2020: LVEF 35-40%. Global hypokinesis. Grade 1 DD. Limited evaluation due to poor acoustic windows.  Assessment & Recommendations:  54 year old African-American female with hypertension, type II diabetes mellitus, tobacco abuse-now quit, morbid obesity, acute on chronic systolic heart failure, advanced CKD  Acute on chronic systolic heart failure: Nonishcemic cardiomyopathy. Not hypoxic on 2 L O2. 2+ pitting edema. Elevated BNP. Inadequate urine response. Metolazone not a good option given her advanced CKD. Unable to use ARNI/ACEi/ARB due to renal dysfunction. Added Bidil 20-37.5 mg tid. Agree with IV lasix 60 mg bid for now. This can be switched to PO lasix 60 mg bid  Echocardiogram eviewed.  Patient has had worsening acute systolic failure. Although her Cr is improved compared to 08/2019, there is no realistic hope to get her renal function improved enough to be able to use all heart failure medical therapy. She is not a candidate for any advanced heart failure treatment due to her comorbidity, morbid obesity. Moreover, I do not anticipate any improved quality of life with these aggressive measures.   I had a long discussion with the patient regarding her prognosis. DNR and home with hospice is still the best option for patient. Patient agrees.   Troponin elevation: Supply demand mismatch due to HF exacerbation. Not MI.  Cardiology will sign off. Please contact if you have any questions.   Nigel Mormon, M.D. [facility] Pager: [phone] After hours, please call office: [phone]

## 2019-12-30 NOTE — Progress Notes (Addendum)
PROGRESS NOTE    [name]  ZSM:[id number] DOB: May 04, 1966 DOA: 12/28/2019 PCP: Vonna Drafts, FNP  Brief Narrative:54 year old w/ super morbid obesity, BMI of 93 (>500lbs) with chronic systolic heart failure/NICM EF 35% then down to 25%, diabetes mellitus, chronic kidney disease 4 was hospitalized from 2/23-3/8 at [facility] with anasarca, worsening CHF, cardiorenal syndrome and progressive renal failure -She was seen by nephrology and not felt to be a dialysis candidate -Followed by palliative care as well and eventually decision was made to transition to home with hospice services - was brought in by EMS yesterday evening due to fevers, weakness  -Her creatinine is better from recent discharge, down to 3.0 last night, BNP was elevated at 1125, chest x-ray noted pulmonary vascular congestion, urinalysis with pyuria and bacteriuria. -She was started on IV antibiotics for presumed catheter associated infection, as well as IV Lasix last night and admitted  Assessment & Plan:   Acute on chronic systolic and diastolic CHF RV dysfunction -Last echo in March with EF of 25% -Repeat echo now with EF < 20%, decreased RV function as well, valves poorly visualized -Admitted with anasarca again, she wishes to be full code and have full scope of treatment which is not in line with hospice philosophy -Being diuresed with IV Lasix continue 60 mg every 12, creatinine is relatively stable -Cardiology following, will d/w Cards if advanced heart failure team needs to be involved   Chronic kidney disease stage IV -Baseline creatinine is around 2, during her recent hospitalization, creatinine trended up to the high 4 range, now improving -Monitor with diuresis -Renally dose medications  -Followed by nephrology last admission and clearly documented and patient and family were told that she is not a dialysis candidate  Pseudomonas UTI with bacteremia  Sepsis  Catheter related UTI -Foley catheter  discontinued yesterday -Continue cefepime -Await speciation -Afebrile now  Acute on chronic hypoxic and hypercarbic respiratory failure Super morbid obesity, OSA, OHS -Diuretics as noted above, CPAP nightly and daytime naps -Long-term needs weight loss  Chronic pain syndrome -Decrease dose of pain meds to prevent oversedation, hypercarbia  DVT prophylaxis: Lovenox Code Status: Full code Family Communication: Discussed patient bedside Disposition Plan:  Dispo: The patient is from: Home              Anticipated d/c is to: Likely home, she is 548 pounds              Anticipated d/c date is: To be determined              Patient currently not medically stable for discharge        Consultants:   Cardiology   Procedures:   Antimicrobials:    Subjective: -Feels better today, Foley catheter was removed yesterday, reports that her swelling has been improving since recent discharge  Objective: Vitals:   12/30/19 0400 12/30/19 0500 12/30/19 0600 12/30/19 0800  BP:      Pulse: 96 96 99   Resp: 19 (!) 29 18   Temp: 97.9 F (36.6 C)   97.7 F (36.5 C)  TempSrc: Oral   Oral  SpO2: 96% 96% 97%   Weight:      Height:        Intake/Output Summary (Last 24 hours) at 12/30/2019 1150 Last data filed at 12/30/2019 1004 Gross per 24 hour  Intake 641.72 ml  Output 950 ml  Net -308.28 ml   Filed Weights   12/28/19 1630 12/28/19 2315  Weight: Marland Kitchen)  217.7 kg (!) 248.6 kg    Examination:  Gen: Super morbidly obese African-American female, sitting up in bed, awake alert, oriented x3 HEENT: Neck obese unable to assess JVD Lungs: Decreased breath sounds bases CVS: S1-S2, regular rate rhythm, distant heart sounds Abd: Soft, obese, abdominal wall edema, bowel sounds present  extremities: 3+ edema with chronic skin changes Skin: As above Psychiatry: Poor insight   Data Reviewed:   CBC: Recent Labs  Lab 12/28/19 1628 12/29/19 0238 12/30/19 0730  WBC 9.4 8.5 9.6    NEUTROABS 8.0* 6.6  --   HGB 9.3* 8.9* 9.7*  HCT 30.5* 30.4* 33.0*  MCV 94.4 97.4 97.6  PLT 139* 132* 712*   Basic Metabolic Panel: Recent Labs  Lab 12/28/19 1628 12/29/19 0238 12/30/19 0730  NA 137 137 135  K 4.5 4.4 4.6  CL 101 100 100  CO2 27 29 23   GLUCOSE 157* 178* 165*  BUN 69* 69* 70*  CREATININE 3.00* 2.84* 2.90*  CALCIUM 8.8* 8.7* 8.8*  MG 1.8 1.8  --   PHOS  --  3.3  --    GFR: Estimated Creatinine Clearance: 46.9 mL/min (A) (by C-G formula based on SCr of 2.9 mg/dL (H)). Liver Function Tests: Recent Labs  Lab 12/28/19 1628 12/29/19 0238  AST 22 20  ALT 12 13  ALKPHOS 88 85  BILITOT 1.6* 1.9*  PROT 8.2* 8.0  ALBUMIN 3.2* 3.1*   No results for input(s): LIPASE, AMYLASE in the last 168 hours. No results for input(s): AMMONIA in the last 168 hours. Coagulation Profile: No results for input(s): INR, PROTIME in the last 168 hours. Cardiac Enzymes: No results for input(s): CKTOTAL, CKMB, CKMBINDEX, TROPONINI in the last 168 hours. BNP (last 3 results) No results for input(s): PROBNP in the last 8760 hours. HbA1C: Recent Labs    12/29/19 0238  HGBA1C 6.9*   CBG: Recent Labs  Lab 12/29/19 0824 12/29/19 1152 12/29/19 1707 12/29/19 2125 12/30/19 0809  GLUCAP 129* 158* 149* 188* 139*   Lipid Profile: Recent Labs    12/30/19 0237  CHOL 73  HDL 19*  LDLCALC 46  TRIG 38  CHOLHDL 3.8   Thyroid Function Tests: Recent Labs    12/29/19 0238  TSH 2.233   Anemia Panel: Recent Labs    12/28/19 1628  FERRITIN 210   Urine analysis:    Component Value Date/Time   COLORURINE YELLOW 12/28/2019 1711   APPEARANCEUR HAZY (A) 12/28/2019 1711   LABSPEC 1.012 12/28/2019 1711   PHURINE 5.0 12/28/2019 1711   GLUCOSEU NEGATIVE 12/28/2019 1711   HGBUR MODERATE (A) 12/28/2019 1711   BILIRUBINUR NEGATIVE 12/28/2019 1711   KETONESUR NEGATIVE 12/28/2019 1711   PROTEINUR NEGATIVE 12/28/2019 1711   UROBILINOGEN 1.0 05/16/2013 1758   NITRITE NEGATIVE  12/28/2019 1711   LEUKOCYTESUR LARGE (A) 12/28/2019 1711   Sepsis Labs: @LABRCNTIP (procalcitonin:4,lacticidven:4)  ) Recent Results (from the past 240 hour(s))  Blood culture (routine x 2)     Status: None (Preliminary result)   Collection Time: 12/28/19  5:11 PM   Specimen: BLOOD RIGHT FOREARM  Result Value Ref Range Status   Specimen Description   Final    BLOOD RIGHT FOREARM Performed at [facility], [place] [address] [place], [place] [address]    Special Requests   Final    BOTTLES DRAWN AEROBIC AND ANAEROBIC Blood Culture results may not be optimal due to an inadequate volume of blood received in culture bottles Performed at [facility]  [address], [place], [place] [address]    Culture   Final    NO GROWTH < 24 HOURS Performed at [facility] [address], [place], [place] [address]    Report Status PENDING  Incomplete  Blood culture (routine x 2)     Status: Abnormal (Preliminary result)   Collection Time: 12/28/19  5:11 PM   Specimen: BLOOD LEFT FOREARM  Result Value Ref Range Status   Specimen Description   Final    BLOOD LEFT FOREARM Performed at [facility] [address] [place], [place] [address]    Special Requests   Final    BOTTLES DRAWN AEROBIC AND ANAEROBIC Blood Culture results may not be optimal due to an excessive volume of blood received in culture bottles Performed at [facility] [address], [place], [place] [address]    Culture  Setup Time   Final    GRAM NEGATIVE RODS AEROBIC BOTTLE ONLY CRITICAL RESULT CALLED TO, READ BACK BY AND VERIFIED WITH: Shelda Jakes [facility] 8938 12/29/19 A BROWNING Performed at [facility], [place] [address] [place], [place] [address]    Culture PSEUDOMONAS AERUGINOSA (A)  Final   Report Status PENDING  Incomplete  Urine culture     Status: Abnormal   Collection Time: 12/28/19  5:11 PM   Specimen: Urine, Random  Result  Value Ref Range Status   Specimen Description   Final    URINE, RANDOM Performed at [facility] [address], [place], [place] [address]    Special Requests   Final    NONE Performed at [facility], [place] [address] [place], [place] [address]    Culture >=100,000 COLONIES/mL PSEUDOMONAS AERUGINOSA (A)  Final   Report Status 12/30/2019 FINAL  Final   Organism ID, Bacteria PSEUDOMONAS AERUGINOSA (A)  Final      Susceptibility   Pseudomonas aeruginosa - MIC*    CEFTAZIDIME >=64 RESISTANT Resistant     CIPROFLOXACIN <=0.25 SENSITIVE Sensitive     GENTAMICIN <=1 SENSITIVE Sensitive     IMIPENEM 1 SENSITIVE Sensitive     CEFEPIME 32 RESISTANT Resistant     * >=100,000 COLONIES/mL PSEUDOMONAS AERUGINOSA  Blood Culture ID Panel (Reflexed)     Status: Abnormal   Collection Time: 12/28/19  5:11 PM  Result Value Ref Range Status   Enterococcus species NOT DETECTED NOT DETECTED Final   Listeria monocytogenes NOT DETECTED NOT DETECTED Final   Staphylococcus species NOT DETECTED NOT DETECTED Final   Staphylococcus aureus (BCID) NOT DETECTED NOT DETECTED Final   Streptococcus species NOT DETECTED NOT DETECTED Final   Streptococcus agalactiae NOT DETECTED NOT DETECTED Final   Streptococcus pneumoniae NOT DETECTED NOT DETECTED Final   Streptococcus pyogenes NOT DETECTED NOT DETECTED Final   Acinetobacter baumannii NOT DETECTED NOT DETECTED Final   Enterobacteriaceae species NOT DETECTED NOT DETECTED Final   Enterobacter cloacae complex NOT DETECTED NOT DETECTED Final   Escherichia coli NOT DETECTED NOT DETECTED Final   Klebsiella oxytoca NOT DETECTED NOT DETECTED Final   Klebsiella pneumoniae NOT DETECTED NOT DETECTED Final   Proteus species NOT DETECTED NOT DETECTED Final   Serratia marcescens NOT DETECTED NOT DETECTED Final   Carbapenem resistance NOT DETECTED NOT DETECTED Final   Haemophilus influenzae NOT DETECTED NOT DETECTED Final   Neisseria  meningitidis NOT DETECTED NOT DETECTED Final   Pseudomonas aeruginosa DETECTED (A) NOT DETECTED Final    Comment: CRITICAL RESULT CALLED TO, READ BACK  BY AND VERIFIED WITH: Shelda Jakes PHARMD [id number] 12/29/19 A BROWNING    Candida albicans NOT DETECTED NOT DETECTED Final   Candida glabrata NOT DETECTED NOT DETECTED Final   Candida krusei NOT DETECTED NOT DETECTED Final   Candida parapsilosis NOT DETECTED NOT DETECTED Final   Candida tropicalis NOT DETECTED NOT DETECTED Final    Comment: Performed at [facility], [place] [address] [place], [place] [address]  SARS CORONAVIRUS 2 (TAT 6-24 HRS) Nasopharyngeal Nasopharyngeal Swab     Status: None   Collection Time: 12/28/19  6:12 PM   Specimen: Nasopharyngeal Swab  Result Value Ref Range Status   SARS Coronavirus 2 NEGATIVE NEGATIVE Final    Comment: (NOTE) SARS-CoV-2 target nucleic acids are NOT DETECTED. The SARS-CoV-2 RNA is generally detectable in upper and lower respiratory specimens during the acute phase of infection. Negative results do not preclude SARS-CoV-2 infection, do not rule out co-infections with other pathogens, and should not be used as the sole basis for treatment or other patient management decisions. Negative results must be combined with clinical observations, patient history, and epidemiological information. The expected result is Negative. Fact Sheet for Patients: SugarRoll.be Fact Sheet for Healthcare Providers: https://www.woods-mathews.com/ This test is not yet approved or cleared by the [place] FDA and  has been authorized for detection and/or diagnosis of SARS-CoV-2 by FDA under an Emergency Use Authorization (EUA). This EUA will remain  in effect (meaning this test can be used) for the duration of the COVID-19 declaration under Section 56 4(b)(1) of the Act, 21 U.S.C. section 360bbb-3(b)(1), unless the authorization is terminated or revoked sooner. Performed at  [facility], [place] [address], [place], [place] [address]   MRSA PCR Screening     Status: None   Collection Time: 12/28/19 10:44 PM   Specimen: Nasal Mucosa; Nasopharyngeal  Result Value Ref Range Status   MRSA by PCR NEGATIVE NEGATIVE Final    Comment:        The GeneXpert MRSA Assay (FDA approved for NASAL specimens only), is one component of a comprehensive MRSA colonization surveillance program. It is not intended to diagnose MRSA infection nor to guide or monitor treatment for MRSA infections. Performed at [facility], [place] [address] [place], [place] [address]          Radiology Studies: DG Chest Portable 1 View  Result Date: 12/28/2019 CLINICAL DATA:  Fever and sepsis EXAM: PORTABLE CHEST 1 VIEW COMPARISON:  December 18, 2019 FINDINGS: The heart size and mediastinal contours are unchanged with cardiomegaly. There is prominence of the central pulmonary vasculature. A probable trace left pleural effusion is present. No large airspace consolidation. No acute osseous findings. IMPRESSION: Cardiomegaly and pulmonary vascular congestion. Probable trace left pleural effusion. Electronically Signed   By: Prudencio Pair M.D.   On: 12/28/2019 18:57   ECHOCARDIOGRAM LIMITED  Result Date: 12/29/2019    ECHOCARDIOGRAM LIMITED REPORT   Patient Name:   ELIZABETHANN LACKEY Date of Exam: 12/29/2019 Medical Rec #:  [id number]        Height:       63.0 in Accession #:    [id number]       Weight:       548.0 lb Date of Birth:  04-Oct-1965         BSA:          2.968 m Patient Age:    23 years         BP:  135/63 mmHg Patient Gender: F                HR:           102 bpm. Exam Location:  Inpatient Procedure: Limited Echo and Intracardiac Opacification Agent Indications:    CHF  History:        Patient has prior history of Echocardiogram examinations, most                 recent 11/20/2019. CHF, Morbid obesity; Risk Factors:Hypertension,                 Dyslipidemia and Sleep  Apnea. CKD, resp. failure.  Sonographer:    Dustin Flock Referring Phys: [place]  1. LVEF < 20%, RVEF at least moderately decreased. Limited study quality. Cardiac MRI is recommended for further quantification and evaluation of cardiomyopathy.  2. Left ventricular ejection fraction, by estimation, is <20%. The left ventricle has severely decreased function. The left ventricle demonstrates global hypokinesis. The left ventricular internal cavity size was moderately dilated.  3. Right ventricular systolic function is moderately reduced. The right ventricular size is moderately enlarged.  4. The mitral valve was not assessed.  5. The aortic valve was not assessed. FINDINGS  Left Ventricle: Left ventricular ejection fraction, by estimation, is <20%. The left ventricle has severely decreased function. The left ventricle demonstrates global hypokinesis. The left ventricular internal cavity size was moderately dilated. Right Ventricle: The right ventricular size is moderately enlarged. Right ventricular systolic function is moderately reduced. Left Atrium: Left atrial size was not assessed. Right Atrium: Right atrial size was not assessed. Mitral Valve: The mitral valve was not assessed. Tricuspid Valve: The tricuspid valve is not assessed. Aortic Valve: The aortic valve was not assessed. Pulmonic Valve: The pulmonic valve was not assessed. Aorta: Aortic root could not be assessed. Ena Dawley MD Electronically signed by Ena Dawley MD Signature Date/Time: 12/29/2019/8:38:57 PM    Final         Scheduled Meds: . Chlorhexidine Gluconate Cloth  6 each Topical Daily  . docusate sodium  100 mg Oral BID  . enoxaparin (LOVENOX) injection  40 mg Subcutaneous QHS  . feeding supplement (PRO-STAT SUGAR FREE 64)  30 mL Oral BID BM  . furosemide  60 mg Intravenous Q12H  . insulin aspart  0-5 Units Subcutaneous QHS  . insulin aspart  0-9 Units Subcutaneous TID WC  . mouth rinse  15  mL Mouth Rinse BID  . metolazone  2.5 mg Oral Daily  . metoprolol succinate  25 mg Oral Daily  . nicotine  21 mg Transdermal Daily  . Ensure Max Protein  11 oz Oral BID  . sodium chloride flush  3 mL Intravenous Q12H  . sodium chloride flush  3 mL Intravenous Q12H   Continuous Infusions: . sodium chloride 10 mL/hr at 12/29/19 0500  . ceFEPime (MAXIPIME) IV Stopped (12/30/19 5038)     LOS: 1 day    Time spent: 9min    Domenic Polite, MD [facility]  12/30/2019, 11:50 AM

## 2019-12-30 NOTE — Progress Notes (Signed)
Inpatient Diabetes Program Recommendations  AACE/ADA: New Consensus Statement on Inpatient Glycemic Control (2015)  Target Ranges:  Prepandial:   less than 140 mg/dL      Peak postprandial:   less than 180 mg/dL (1-2 hours)      Critically ill patients:  140 - 180 mg/dL   Lab Results  Component Value Date   GLUCAP 156 (H) 12/30/2019   HGBA1C 6.9 (H) 12/29/2019    Review of Glycemic Control  Diabetes history: DM2 Outpatient Diabetes medications: Ozempic 0.25 mg SQ weekly on Wednesday Current orders for Inpatient glycemic control: Novolog 0-9 units tidwc and hs  HgbA1C - 6.9%  Inpatient Diabetes Program Recommendations:     Agree with orders. ? Bariatric surgery consideration  Will follow while inpatient.  Thank you. Lorenda Peck, RD, LDN, CDE Inpatient Diabetes Coordinator [phone]

## 2019-12-30 NOTE — Progress Notes (Signed)
Physical Therapy Treatment Patient Details Name: [name] MRN: [id number] DOB: 08-30-1966 Today's Date: 12/30/2019    History of Present Illness [name] is a 54 y.o. female with medical history significant of Chronic systolic CHF, HTN, CKD CBULA4T, morbid obesity , diabetes, HLD, HTN, GERD, sleep apnea, diabetic neuropathy chronic respiratory failure on 3 L of O2, admitted with fever, recently long hospitalization, DC'd to home with Hospice.Recently was seen in the ER was seen for SVT on 4 April converted prehospital with adenosine by EMS  was able to be discharged to home from [facility] recently developed significant anasarca and required hemodialysisShe had a prolonged hospital stay and was eventually discharged home on hospice and made DNR/DNI at this point patient denies wanting to be DNR/DNI she would like to be full code and have aggressive care.    PT Comments    Patient assists with rolling, reaching for rails, more difficulty to roll to right. Requires  3 assisting. Patient assisted OOB with mechanical lift and moved to a new bed that has all functions working.  Patient performed IS x 10, pulled 750 ML.   Follow Up Recommendations  Home health PT     Equipment Recommendations  Wheelchair (measurements PT)(mechanical lift)    Recommendations for Other Services       Precautions / Restrictions Precautions Precautions: Fall    Mobility  Bed Mobility Overal bed mobility: Needs Assistance Bed Mobility: Rolling           General bed mobility comments: +3 to roll to each side, more easily rolls to left, placed lift pad to lift patient in order to switch to a new bed.  Transfers                 General transfer comment: to lift to new bed  Ambulation/Gait                 Stairs             Wheelchair Mobility    Modified Rankin (Stroke Patients Only)       Balance                                             Cognition Arousal/Alertness: Awake/alert                                            Exercises      General Comments General comments (skin integrity, edema, etc.): tiwent able to pull self forward  using both UE's , pulling on 2 persons, could not remain forward without support      Pertinent Vitals/Pain Faces Pain Scale: Hurts a little bit Pain Location: back legs Pain Descriptors / Indicators: Aching;Discomfort Pain Intervention(s): Monitored during session;Premedicated before session    Home Living                      Prior Function            PT Goals (current goals can now be found in the care plan section) Progress towards PT goals: Progressing toward goals    Frequency    Min 2X/week      PT Plan Current plan remains appropriate    Co-evaluation  AM-PAC PT "6 Clicks" Mobility   Outcome Measure  Help needed turning from your back to your side while in a flat bed without using bedrails?: Total Help needed moving from lying on your back to sitting on the side of a flat bed without using bedrails?: Total Help needed moving to and from a bed to a chair (including a wheelchair)?: Total Help needed standing up from a chair using your arms (e.g., wheelchair or bedside chair)?: Total Help needed to walk in hospital room?: Total Help needed climbing 3-5 steps with a railing? : Total 6 Click Score: 6    End of Session   Activity Tolerance: Patient tolerated treatment well Patient left: in bed;with call bell/phone within reach Nurse Communication: Need for lift equipment;Mobility status PT Visit Diagnosis: Muscle weakness (generalized) (M62.81)     Time: 1110-1200 PT Time Calculation (min) (ACUTE ONLY): 50 min  Charges:  $Therapeutic Activity: 38-52 mins                     Tresa Endo PT Acute Rehabilitation Services Pager [phone] Office [phone]    Claretha Cooper 12/30/2019, 1:00  PM

## 2019-12-30 NOTE — Progress Notes (Signed)
Pharmacy Antibiotic Note  [name] is a 54 y.o. female admitted on 12/28/2019 with sepsis.  Pharmacy has been consulted for cefepime and vancomycin dosing.  Today, 12/30/2019:  Afebrile since 4/15  WBC stable WNL  SCr remains elevated, stable (baseline appears to be around 1.5-2.0)  Pseudomonas growing in urine showing resistance to cephalosporins; d/w MD and will switch to meropenem  Plan:  Stop cefepime; start meropenem 2g IV q12 hr (not hemodynamically unstable, but with weight > 400 lbs, likely necessary to achieve adequate exposure)  F/u clinical course, SCr, and GOC  Height: 5\' 3"  (160 cm) Weight: (!) 186.4 kg (411 lb)(Changed out bed and re-zeroed.. new accurate weight) IBW/kg (Calculated) : 52.4  Temp (24hrs), Avg:98.3 F (36.8 C), Min:97.7 F (36.5 C), Max:99 F (37.2 C)  Recent Labs  Lab 12/28/19 1628 12/29/19 0238 12/30/19 0730  WBC 9.4 8.5 9.6  CREATININE 3.00* 2.84* 2.90*  LATICACIDVEN 1.4  --   --     Estimated Creatinine Clearance: 38 mL/min (A) (by C-G formula based on SCr of 2.9 mg/dL (H)).    No Known Allergies  Antimicrobials this admission: 4/14 cefepime >> 4/16 meropenem >>  4/14 vancomycin >> 4/15   Dose adjustments this admission: n/a  Microbiology results: 4/14 BCx: Pseudomonas aeruginosa 4/14 UCx:  >100k pseudomonas aeruginosa R-cephs, S-imi, gent, cipro 4/14 MRSA PCR: negative   4/14 COVID-19: Negative   Thank you for allowing pharmacy to be a part of this patient's care.  Kyen Taite A 12/30/2019 2:22 PM

## 2019-12-31 DIAGNOSIS — I5023 Acute on chronic systolic (congestive) heart failure: Secondary | ICD-10-CM | POA: Diagnosis not present

## 2019-12-31 DIAGNOSIS — N390 Urinary tract infection, site not specified: Secondary | ICD-10-CM | POA: Diagnosis not present

## 2019-12-31 DIAGNOSIS — N184 Chronic kidney disease, stage 4 (severe): Secondary | ICD-10-CM | POA: Diagnosis not present

## 2019-12-31 LAB — CBC
HCT: 31.3 % — ABNORMAL LOW (ref 36.0–46.0)
Hemoglobin: 9.2 g/dL — ABNORMAL LOW (ref 12.0–15.0)
MCH: 28.4 pg (ref 26.0–34.0)
MCHC: 29.4 g/dL — ABNORMAL LOW (ref 30.0–36.0)
MCV: 96.6 fL (ref 80.0–100.0)
Platelets: 117 10*3/uL — ABNORMAL LOW (ref 150–400)
RBC: 3.24 MIL/uL — ABNORMAL LOW (ref 3.87–5.11)
RDW: 23 % — ABNORMAL HIGH (ref 11.5–15.5)
WBC: 10.3 10*3/uL (ref 4.0–10.5)
nRBC: 0 % (ref 0.0–0.2)

## 2019-12-31 LAB — CULTURE, BLOOD (ROUTINE X 2)

## 2019-12-31 LAB — BASIC METABOLIC PANEL
Anion gap: 10 (ref 5–15)
BUN: 79 mg/dL — ABNORMAL HIGH (ref 6–20)
CO2: 27 mmol/L (ref 22–32)
Calcium: 8.7 mg/dL — ABNORMAL LOW (ref 8.9–10.3)
Chloride: 98 mmol/L (ref 98–111)
Creatinine, Ser: 3.05 mg/dL — ABNORMAL HIGH (ref 0.44–1.00)
GFR calc Af Amer: 19 mL/min — ABNORMAL LOW (ref >60)
GFR calc non Af Amer: 17 mL/min — ABNORMAL LOW (ref >60)
Glucose, Bld: 141 mg/dL — ABNORMAL HIGH (ref 70–99)
Potassium: 4.4 mmol/L (ref 3.5–5.1)
Sodium: 135 mmol/L (ref 135–145)

## 2019-12-31 LAB — GLUCOSE, CAPILLARY
Glucose-Capillary: 140 mg/dL — ABNORMAL HIGH (ref 70–99)
Glucose-Capillary: 127 mg/dL — ABNORMAL HIGH (ref 70–99)

## 2019-12-31 MED ORDER — CIPROFLOXACIN HCL 250 MG PO TABS: 250.0000 mg | ORAL_TABLET | Freq: Two times a day (BID) | ORAL | 0 refills | Status: AC

## 2019-12-31 MED ORDER — OXYCODONE-ACETAMINOPHEN 10-325 MG PO TABS: 1.0000 | ORAL_TABLET | Freq: Three times a day (TID) | ORAL | 0 refills | Status: AC | PRN

## 2019-12-31 NOTE — Discharge Summary (Addendum)
Physician Discharge Summary  [name] PXT:[id number] DOB: 03-Jan-1966 DOA: 12/28/2019  PCP: Vonna Drafts, FNP  Admit date: 12/28/2019 Discharge date: 12/31/2019  Time spent: 35 minutes  Recommendations for Outpatient Follow-up:  1. Home with hospice for symptom focused care, [facility] 2. Cardiology Dr. Vernell Leep in 4 weeks   Discharge Diagnoses:  DO NOT RESUSCITATE Pseudomonas catheter associated UTI, sepsis Super morbid obesity, BMI of 90 Acute on chronic systolic and diastolic CHF, EF less than 20% Severe right heart failure Stage IV chronic kidney disease Chronic respiratory failure, OSA OHS Bedbound, immobility Chronic pain syndrome Chronic narcotic dependence   Morbid obesity (HCC)   Essential hypertension   GASTROESOPHAGEAL REFLUX, NO ESOPHAGITIS   OSA (obstructive sleep apnea)   Diabetes mellitus (HCC)   Chronic renal insufficiency, stage 2 (mild)   Acute on chronic systolic congestive heart failure (HCC)   Chronic pain syndrome   Sepsis (HCC)   CHF (congestive heart failure) (HCC)   Urinary tract infection without hematuria   Mixed hyperlipidemia   Chronic kidney disease, stage IV (severe) (HCC)   Elevated troponin   Chronic respiratory failure with hypoxia ([place])   On home oxygen therapy   Discharge Condition: Fair, overall prognosis is very poor  Diet recommendation: Low-sodium  Filed Weights   12/28/19 2315 12/30/19 1200 12/31/19 0600  Weight: (!) 248.6 kg (!) 186.4 kg (!) 207.3 kg    History of present illness:  54 year old w/ super morbid obesity, BMI of 93 (>500lbs) with chronic systolic heart failure/NICM EF of 25%, diabetes mellitus, chronic kidney disease 4 was hospitalized from 2/23-3/8 at [facility] with anasarca, worsening CHF, cardiorenal syndrome and progressive renal failure -She was seen by nephrology and not felt to be a dialysis candidate -Followed by palliative care as well and eventually decision was made to  transition to home with hospice services - was brought in by EMS with fevers, weakness, fluid overload  Hospital Course:   Acute on chronic systolic and diastolic CHF RV dysfunction -Last echo in March with EF of 25% -Repeat echo now with EF < 20%, decreased RV function as well, valves poorly visualized -Admitted with anasarca again, she was discharged home with hospice services 52 weeks ago given worsening anasarca with cardiorenal syndrome, progressive renal failure, seen by nephrology and not felt to be a dialysis candidate -Upon admission patient wished full scope of treatment, repeat echo now notes worsening EF of less than 20%,, in the setting of advanced CKD, super morbid obesity, BMI >90, bedbound state -Seen by cardiology Dr. Virgina Jock again, felt that she has worsening systolic CHF and not a candidate for advanced heart failure therapy and already previously noted by nephrology during recent admission that she is not a dialysis candidate. -He had a lengthy discussion with patient yesterday regarding grave prognosis and felt that DNR with home hospice services would be her best option for symptom focused care in her home setting with her family, patient finally agrees to this -She will be discharged home with hospice services today, oral  Lasix resumed along with BiDil  Chronic kidney disease stage IV -Baseline creatinine is around 2, during her recent hospitalization, creatinine trended up to the high 4 range, now around 3 -Followed by nephrology last admission and clearly documented and patient and family were told that she is not a dialysis candidate -Home w/ hospice  Pseudomonas UTI with bacteremia  Sepsis  Catheter related UTI -Foley catheter discontinued on admission, she is able to urinate without difficulty -Treated  with IV meropenem, based on sensitivities this is sensitive to ciprofloxacin as well -Overall prognosis is poor, see discussion above, transitioned to oral  Cipro for 4 more days discharge  Acute on chronic hypoxic and hypercarbic respiratory failure Super morbid obesity, OSA, OHS -Diuretics as noted above, CPAP nightly and daytime naps  Chronic pain syndrome -Decrease dose of pain meds to prevent oversedation, hypercarbia   Consultations:  Cardiology Dr. Virgina Jock  Discharge Exam: Vitals:   12/31/19 0547 12/31/19 1001  BP: 135/80 120/70  Pulse: 78 82  Resp: 20 20  Temp: 97.6 F (36.4 C) (!) 97.4 F (36.3 C)  SpO2: 98% 99%    General: Awake alert, oriented x3 Cardiovascular: Distant heart sounds Respiratory: Distant breath sounds  Discharge Instructions   Discharge Instructions    Diet - low sodium heart healthy   Complete by: As directed    Increase activity slowly   Complete by: As directed      Allergies as of 12/31/2019   No Known Allergies     Medication List    TAKE these medications   albuterol (2.5 MG/3ML) 0.083% nebulizer solution Commonly known as: PROVENTIL Take 2.5 mg by nebulization every 4 (four) hours as needed for wheezing.   allopurinol 300 MG tablet Commonly known as: ZYLOPRIM Take 300 mg by mouth daily.   ALPRAZolam 0.25 MG tablet Commonly known as: XANAX Take 0.25 mg by mouth 2 (two) times daily as needed for anxiety.   Baclofen 5 MG Tabs Take 1 tablet by mouth 3 (three) times daily as needed for muscle spasms.   BiDil 20-37.5 MG tablet Generic drug: isosorbide-hydrALAZINE Take 1 tablet by mouth 2 times daily at 12 noon and 4 pm.   ciprofloxacin 250 MG tablet Commonly known as: Cipro Take 1 tablet (250 mg total) by mouth 2 (two) times daily for 4 days.   furosemide 40 MG tablet Commonly known as: Lasix Take 1 tablet (40 mg total) by mouth 2 (two) times daily.   metoprolol succinate 25 MG 24 hr tablet Commonly known as: TOPROL-XL TAKE 1 TABLET(25 MG) BY MOUTH DAILY WITH OR IMMEDIATELY FOLLOWING A MEAL What changed: See the new instructions.   Narcan 4 MG/0.1ML Liqd nasal  spray kit Generic drug: naloxone Place 0.4 mg into the nose as needed (for overdose).   oxyCODONE-acetaminophen 10-325 MG tablet Commonly known as: PERCOCET Take 1 tablet by mouth every 8 (eight) hours as needed for pain. What changed:   when to take this  reasons to take this   Ozempic (0.25 or 0.5 MG/DOSE) 2 MG/1.5ML Sopn Generic drug: Semaglutide(0.25 or 0.5MG/DOS) Inject 0.25 mg into the skin every Wednesday.   polyethylene glycol 17 g packet Commonly known as: MIRALAX / GLYCOLAX Take 17 g by mouth daily as needed for mild constipation.   senna-docusate 8.6-50 MG tablet Commonly known as: Senokot-S Take 2 tablets by mouth at bedtime as needed for mild constipation or moderate constipation.      No Known Allergies Follow-up Information    Patwardhan, Manish J, MD Follow up in 4 week(s).   Specialties: Cardiology, Radiology Contact information: [place] [address] [phone]            The results of significant diagnostics from this hospitalization (including imaging, microbiology, ancillary and laboratory) are listed below for reference.    Significant Diagnostic Studies: DG Chest Portable 1 View  Result Date: 12/28/2019 CLINICAL DATA:  Fever and sepsis EXAM: PORTABLE CHEST 1 VIEW COMPARISON:  December 18, 2019 FINDINGS: The heart size and mediastinal contours are unchanged with cardiomegaly. There is prominence of the central pulmonary vasculature. A probable trace left pleural effusion is present. No large airspace consolidation. No acute osseous findings. IMPRESSION: Cardiomegaly and pulmonary vascular congestion. Probable trace left pleural effusion. Electronically Signed   By: Prudencio Pair M.D.   On: 12/28/2019 18:57   DG Chest Port 1 View  Result Date: 12/18/2019 CLINICAL DATA:  Chest pain. EXAM: PORTABLE CHEST 1 VIEW COMPARISON:  11/07/2019 FINDINGS: Cardiac silhouette is mildly enlarged. No mediastinal or hilar masses.  Lung volumes are low with bronchovascular crowding. Opacity noted in the bases consistent with atelectasis. No convincing pneumonia or pulmonary edema. No visualized pleural effusion and no pneumothorax. IMPRESSION: 1. No convincing acute cardiopulmonary disease. 2. Mild cardiomegaly. Electronically Signed   By: Lajean Manes M.D.   On: 12/18/2019 07:35   ECHOCARDIOGRAM LIMITED  Result Date: 12/29/2019    ECHOCARDIOGRAM LIMITED REPORT   Patient Name:   [name] Date of Exam: 12/29/2019 Medical Rec #:  [id number]        Height:       63.0 in Accession #:    [id number]       Weight:       548.0 lb Date of Birth:  [birth year]         BSA:          2.968 m Patient Age:    40 years         BP:           135/63 mmHg Patient Gender: F                HR:           102 bpm. Exam Location:  Inpatient Procedure: Limited Echo and Intracardiac Opacification Agent Indications:    CHF  History:        Patient has prior history of Echocardiogram examinations, most                 recent 11/20/2019. CHF, Morbid obesity; Risk Factors:Hypertension,                 Dyslipidemia and Sleep Apnea. CKD, resp. failure.  Sonographer:    Dustin Flock Referring Phys: [place]  1. LVEF < 20%, RVEF at least moderately decreased. Limited study quality. Cardiac MRI is recommended for further quantification and evaluation of cardiomyopathy.  2. Left ventricular ejection fraction, by estimation, is <20%. The left ventricle has severely decreased function. The left ventricle demonstrates global hypokinesis. The left ventricular internal cavity size was moderately dilated.  3. Right ventricular systolic function is moderately reduced. The right ventricular size is moderately enlarged.  4. The mitral valve was not assessed.  5. The aortic valve was not assessed. FINDINGS  Left Ventricle: Left ventricular ejection fraction, by estimation, is <20%. The left ventricle has severely decreased function. The left  ventricle demonstrates global hypokinesis. The left ventricular internal cavity size was moderately dilated. Right Ventricle: The right ventricular size is moderately enlarged. Right ventricular systolic function is moderately reduced. Left Atrium: Left atrial size was not assessed. Right Atrium: Right atrial size was not assessed. Mitral Valve: The mitral valve was not assessed. Tricuspid Valve: The tricuspid valve is not assessed. Aortic Valve: The aortic valve was not assessed. Pulmonic Valve: The pulmonic valve was not assessed. Aorta: Aortic root could not be assessed. Ena Dawley MD Electronically signed by Ena Dawley MD Signature  Date/Time: 12/29/2019/8:38:57 PM    Final    DG KNEE 3 VIEW RIGHT  Result Date: 12/30/2019 CLINICAL DATA:  Right knee pain since falling to the ground in February 2021. EXAM: RIGHT KNEE - 3 VIEW COMPARISON:  06/25/2018 and 05/11/2018 FINDINGS: Again demonstrated is a right knee prosthesis in satisfactory position and alignment. Interval calcific/ossific density anterior to the distal femur on the lateral, not seen in the frontal projection. No interval fracture or dislocation. No periprosthetic lucency. IMPRESSION: 1. No acute abnormality. 2. Interval calcific/ossific density anterior to the distal femur on the lateral, not seen in the frontal projection. This could represent heterotopic ossification/myositis ossificans. 3. Stable right knee prosthesis. Electronically Signed   By: Claudie Revering M.D.   On: 12/30/2019 16:15    Microbiology: Recent Results (from the past 240 hour(s))  Blood culture (routine x 2)     Status: None (Preliminary result)   Collection Time: 12/28/19  5:11 PM   Specimen: BLOOD RIGHT FOREARM  Result Value Ref Range Status   Specimen Description   Final    BLOOD RIGHT FOREARM Performed at [place] [address] [place], [place] [address]    Special Requests   Final    BOTTLES DRAWN AEROBIC AND ANAEROBIC Blood  Culture results may not be optimal due to an inadequate volume of blood received in culture bottles Performed at [place] [address], [place], [place] [address]    Culture   Final    NO GROWTH 3 DAYS Performed at [facility], [place] [address], [place], [place] [address]    Report Status PENDING  Incomplete  Blood culture (routine x 2)     Status: Abnormal   Collection Time: 12/28/19  5:11 PM   Specimen: BLOOD LEFT FOREARM  Result Value Ref Range Status   Specimen Description   Final    BLOOD LEFT FOREARM Performed at [place] [address] [place], [place] [address]    Special Requests   Final    BOTTLES DRAWN AEROBIC AND ANAEROBIC Blood Culture results may not be optimal due to an excessive volume of blood received in culture bottles Performed at [facility] [place] [address], [place], [place] [address]    Culture  Setup Time   Final    GRAM NEGATIVE RODS AEROBIC BOTTLE ONLY CRITICAL RESULT CALLED TO, READ BACK BY AND VERIFIED WITH: Shelda Jakes [facility] 0272 12/29/19 A BROWNING Performed at [facility], [place] [address], [place], [place] [address]    Culture PSEUDOMONAS AERUGINOSA (A)  Final   Report Status 12/31/2019 FINAL  Final   Organism ID, Bacteria PSEUDOMONAS AERUGINOSA  Final      Susceptibility   Pseudomonas aeruginosa - MIC*    CEFTAZIDIME 16 INTERMEDIATE Intermediate     CIPROFLOXACIN <=0.25 SENSITIVE Sensitive     GENTAMICIN <=1 SENSITIVE Sensitive     IMIPENEM 1 SENSITIVE Sensitive     * PSEUDOMONAS AERUGINOSA  Urine culture     Status: Abnormal   Collection Time: 12/28/19  5:11 PM   Specimen: Urine, Random  Result Value Ref Range Status   Specimen Description   Final    URINE, RANDOM Performed at [facility], [place] [address] [place], [place] [address]    Special Requests   Final    NONE Performed at [facility], [place] [address]  [place], [place] [address]    Culture >=100,000 COLONIES/mL PSEUDOMONAS  AERUGINOSA (A)  Final   Report Status 12/30/2019 FINAL  Final   Organism ID, Bacteria PSEUDOMONAS AERUGINOSA (A)  Final      Susceptibility   Pseudomonas aeruginosa - MIC*    CEFTAZIDIME >=64 RESISTANT Resistant     CIPROFLOXACIN <=0.25 SENSITIVE Sensitive     GENTAMICIN <=1 SENSITIVE Sensitive     IMIPENEM 1 SENSITIVE Sensitive     CEFEPIME 32 RESISTANT Resistant     * >=100,000 COLONIES/mL PSEUDOMONAS AERUGINOSA  Blood Culture ID Panel (Reflexed)     Status: Abnormal   Collection Time: 12/28/19  5:11 PM  Result Value Ref Range Status   Enterococcus species NOT DETECTED NOT DETECTED Final   Listeria monocytogenes NOT DETECTED NOT DETECTED Final   Staphylococcus species NOT DETECTED NOT DETECTED Final   Staphylococcus aureus (BCID) NOT DETECTED NOT DETECTED Final   Streptococcus species NOT DETECTED NOT DETECTED Final   Streptococcus agalactiae NOT DETECTED NOT DETECTED Final   Streptococcus pneumoniae NOT DETECTED NOT DETECTED Final   Streptococcus pyogenes NOT DETECTED NOT DETECTED Final   Acinetobacter baumannii NOT DETECTED NOT DETECTED Final   Enterobacteriaceae species NOT DETECTED NOT DETECTED Final   Enterobacter cloacae complex NOT DETECTED NOT DETECTED Final   Escherichia coli NOT DETECTED NOT DETECTED Final   Klebsiella oxytoca NOT DETECTED NOT DETECTED Final   Klebsiella pneumoniae NOT DETECTED NOT DETECTED Final   Proteus species NOT DETECTED NOT DETECTED Final   Serratia marcescens NOT DETECTED NOT DETECTED Final   Carbapenem resistance NOT DETECTED NOT DETECTED Final   Haemophilus influenzae NOT DETECTED NOT DETECTED Final   Neisseria meningitidis NOT DETECTED NOT DETECTED Final   Pseudomonas aeruginosa DETECTED (A) NOT DETECTED Final    Comment: CRITICAL RESULT CALLED TO, READ BACK BY AND VERIFIED WITH: Shelda Jakes PHARMD [id number] 12/29/19 A BROWNING    Candida albicans NOT DETECTED NOT DETECTED Final    Candida glabrata NOT DETECTED NOT DETECTED Final   Candida krusei NOT DETECTED NOT DETECTED Final   Candida parapsilosis NOT DETECTED NOT DETECTED Final   Candida tropicalis NOT DETECTED NOT DETECTED Final    Comment: Performed at [facility], [address], [place], [place] [address]  SARS CORONAVIRUS 2 (TAT 6-24 HRS) Nasopharyngeal Nasopharyngeal Swab     Status: None   Collection Time: 12/28/19  6:12 PM   Specimen: Nasopharyngeal Swab  Result Value Ref Range Status   SARS Coronavirus 2 NEGATIVE NEGATIVE Final    Comment: (NOTE) SARS-CoV-2 target nucleic acids are NOT DETECTED. The SARS-CoV-2 RNA is generally detectable in upper and lower respiratory specimens during the acute phase of infection. Negative results do not preclude SARS-CoV-2 infection, do not rule out co-infections with other pathogens, and should not be used as the sole basis for treatment or other patient management decisions. Negative results must be combined with clinical observations, patient history, and epidemiological information. The expected result is Negative. Fact Sheet for Patients: SugarRoll.be Fact Sheet for Healthcare Providers: https://www.woods-mathews.com/ This test is not yet approved or cleared by the [place] FDA and  has been authorized for detection and/or diagnosis of SARS-CoV-2 by FDA under an Emergency Use Authorization (EUA). This EUA will remain  in effect (meaning this test can be used) for the duration of the COVID-19 declaration under Section 56 4(b)(1) of the Act, 21 U.S.C. section 360bbb-3(b)(1), unless the authorization is terminated or revoked sooner. Performed at [facility], [place] [address] [place], [place] [address]   MRSA PCR Screening  Status: None   Collection Time: 12/28/19 10:44 PM   Specimen: Nasal Mucosa; Nasopharyngeal  Result Value Ref Range Status   MRSA by PCR NEGATIVE NEGATIVE Final    Comment:         The GeneXpert MRSA Assay (FDA approved for NASAL specimens only), is one component of a comprehensive MRSA colonization surveillance program. It is not intended to diagnose MRSA infection nor to guide or monitor treatment for MRSA infections. Performed at [facility], [place] [address], [place],  [address]      Labs: Basic Metabolic Panel: Recent Labs  Lab 12/28/19 1628 12/29/19 0238 12/30/19 0730 12/31/19 0636  NA 137 137 135 135  K 4.5 4.4 4.6 4.4  CL 101 100 100 98  CO2 _0 GLUCOSE 157* 178* 165* 141*  BUN 69* 69* 70* 79*  CREATININE 3.00* 2.84* 2.90* 3.05*  CALCIUM 8.8* 8.7* 8.8* 8.7*  MG 1.8 1.8  --   --   PHOS  --  3.3  --   --    Liver Function Tests: Recent Labs  Lab 12/28/19 1628 12/29/19 0238  AST 22 20  ALT 12 13  ALKPHOS 88 85  BILITOT 1.6* 1.9*  PROT 8.2* 8.0  ALBUMIN 3.2* 3.1*   No results for input(s): LIPASE, AMYLASE in the last 168 hours. No results for input(s): AMMONIA in the last 168 hours. CBC: Recent Labs  Lab 12/28/19 1628 12/29/19 0238 12/30/19 0730 12/31/19 0636  WBC 9.4 8.5 9.6 10.3  NEUTROABS 8.0* 6.6  --   --   HGB 9.3* 8.9* 9.7* 9.2*  HCT 30.5* 30.4* 33.0* 31.3*  MCV 94.4 97.4 97.6 96.6  PLT 139* 132* 121* 117*   Cardiac Enzymes: No results for input(s): CKTOTAL, CKMB, CKMBINDEX, TROPONINI in the last 168 hours. BNP: BNP (last 3 results) Recent Labs    10/15/19 2252 11/07/19 2145 12/28/19 1628  BNP 562.8* 584.6* 1,125.2*    ProBNP (last 3 results) No results for input(s): PROBNP in the last 8760 hours.  CBG: Recent Labs  Lab 12/30/19 1232 12/30/19 1805 12/30/19 2204 12/31/19 0806 12/31/19 1200  GLUCAP 156* 177* 168* 140* 127*       Signed:  Domenic Polite MD.  [facility] 12/31/2019, 2:18 PM

## 2019-12-31 NOTE — Progress Notes (Signed)
[name], with [facility] informed of patient discharging home today.

## 2019-12-31 NOTE — Progress Notes (Signed)
Manufacturing engineer [facility])  Patient to transfer home later today.  For transport, please call [facility], they contract this service for our active hospice patients.  [facility] will see her in the home within 24 hours of her discharge.  Venia Carbon RN, BSN, [facility] Liaison (in [place])

## 2020-01-02 LAB — CULTURE, BLOOD (ROUTINE X 2): Culture: NO GROWTH

## 2020-01-03 ENCOUNTER — Encounter (HOSPITAL_COMMUNITY): Payer: Self-pay

## 2020-01-03 ENCOUNTER — Other Ambulatory Visit: Payer: Self-pay

## 2020-01-03 ENCOUNTER — Emergency Department (HOSPITAL_COMMUNITY)
Admission: EM | Admit: 2020-01-03 | Discharge: 2020-01-03 | Disposition: A | Discharge status: Hospice | Attending: Emergency Medicine | Admitting: Emergency Medicine

## 2020-01-03 DIAGNOSIS — R531 Weakness: Secondary | ICD-10-CM | POA: Diagnosis not present

## 2020-01-03 DIAGNOSIS — Z79899 Other long term (current) drug therapy: Secondary | ICD-10-CM | POA: Insufficient documentation

## 2020-01-03 DIAGNOSIS — I130 Hypertensive heart and chronic kidney disease with heart failure and stage 1 through stage 4 chronic kidney disease, or unspecified chronic kidney disease: Secondary | ICD-10-CM | POA: Insufficient documentation

## 2020-01-03 DIAGNOSIS — F17210 Nicotine dependence, cigarettes, uncomplicated: Secondary | ICD-10-CM | POA: Diagnosis not present

## 2020-01-03 DIAGNOSIS — Z743 Need for continuous supervision: Secondary | ICD-10-CM | POA: Diagnosis not present

## 2020-01-03 DIAGNOSIS — N183 Chronic kidney disease, stage 3 unspecified: Secondary | ICD-10-CM | POA: Diagnosis not present

## 2020-01-03 DIAGNOSIS — I509 Heart failure, unspecified: Secondary | ICD-10-CM | POA: Diagnosis not present

## 2020-01-03 DIAGNOSIS — E1122 Type 2 diabetes mellitus with diabetic chronic kidney disease: Secondary | ICD-10-CM | POA: Insufficient documentation

## 2020-01-03 DIAGNOSIS — Z7401 Bed confinement status: Secondary | ICD-10-CM | POA: Diagnosis not present

## 2020-01-03 DIAGNOSIS — R410 Disorientation, unspecified: Secondary | ICD-10-CM | POA: Diagnosis not present

## 2020-01-03 DIAGNOSIS — R079 Chest pain, unspecified: Secondary | ICD-10-CM | POA: Diagnosis not present

## 2020-01-03 DIAGNOSIS — Z7984 Long term (current) use of oral hypoglycemic drugs: Secondary | ICD-10-CM | POA: Insufficient documentation

## 2020-01-03 DIAGNOSIS — Z7982 Long term (current) use of aspirin: Secondary | ICD-10-CM | POA: Diagnosis not present

## 2020-01-03 DIAGNOSIS — R251 Tremor, unspecified: Secondary | ICD-10-CM | POA: Diagnosis present

## 2020-01-03 DIAGNOSIS — Z96651 Presence of right artificial knee joint: Secondary | ICD-10-CM | POA: Diagnosis not present

## 2020-01-03 DIAGNOSIS — R0681 Apnea, not elsewhere classified: Secondary | ICD-10-CM | POA: Diagnosis not present

## 2020-01-03 DIAGNOSIS — R0789 Other chest pain: Secondary | ICD-10-CM | POA: Diagnosis not present

## 2020-01-03 DIAGNOSIS — M2550 Pain in unspecified joint: Secondary | ICD-10-CM | POA: Diagnosis not present

## 2020-01-03 LAB — CBC WITH DIFFERENTIAL/PLATELET
Abs Immature Granulocytes: 0.11 10*3/uL — ABNORMAL HIGH (ref 0.00–0.07)
Basophils Absolute: 0.1 10*3/uL (ref 0.0–0.1)
Basophils Relative: 1 %
Eosinophils Absolute: 0.3 10*3/uL (ref 0.0–0.5)
Eosinophils Relative: 4 %
HCT: 29.6 % — ABNORMAL LOW (ref 36.0–46.0)
Hemoglobin: 8.9 g/dL — ABNORMAL LOW (ref 12.0–15.0)
Immature Granulocytes: 1 %
Lymphocytes Relative: 11 %
Lymphs Abs: 0.8 10*3/uL (ref 0.7–4.0)
MCH: 28.1 pg (ref 26.0–34.0)
MCHC: 30.1 g/dL (ref 30.0–36.0)
MCV: 93.4 fL (ref 80.0–100.0)
Monocytes Absolute: 1.2 10*3/uL — ABNORMAL HIGH (ref 0.1–1.0)
Monocytes Relative: 15 %
Neutro Abs: 5.3 10*3/uL (ref 1.7–7.7)
Neutrophils Relative %: 68 %
Platelets: 158 10*3/uL (ref 150–400)
RBC: 3.17 MIL/uL — ABNORMAL LOW (ref 3.87–5.11)
RDW: 22.9 % — ABNORMAL HIGH (ref 11.5–15.5)
WBC: 7.8 10*3/uL (ref 4.0–10.5)
nRBC: 0 % (ref 0.0–0.2)

## 2020-01-03 LAB — COMPREHENSIVE METABOLIC PANEL
ALT: 12 U/L (ref 0–44)
AST: 20 U/L (ref 15–41)
Albumin: 2.9 g/dL — ABNORMAL LOW (ref 3.5–5.0)
Alkaline Phosphatase: 95 U/L (ref 38–126)
Anion gap: 12 (ref 5–15)
BUN: 81 mg/dL — ABNORMAL HIGH (ref 6–20)
CO2: 25 mmol/L (ref 22–32)
Calcium: 8.9 mg/dL (ref 8.9–10.3)
Chloride: 99 mmol/L (ref 98–111)
Creatinine, Ser: 2.98 mg/dL — ABNORMAL HIGH (ref 0.44–1.00)
GFR calc Af Amer: 20 mL/min — ABNORMAL LOW (ref >60)
GFR calc non Af Amer: 17 mL/min — ABNORMAL LOW (ref >60)
Glucose, Bld: 175 mg/dL — ABNORMAL HIGH (ref 70–99)
Potassium: 4.2 mmol/L (ref 3.5–5.1)
Sodium: 136 mmol/L (ref 135–145)
Total Bilirubin: 1.7 mg/dL — ABNORMAL HIGH (ref 0.3–1.2)
Total Protein: 8.3 g/dL — ABNORMAL HIGH (ref 6.5–8.1)

## 2020-01-03 NOTE — ED Provider Notes (Signed)
[facility] DEPT Provider Note   CSN: [id number] Arrival date & time: 01/03/20  1320     History Chief Complaint  Patient presents with  . Sleep Apnea    [name] is a 54 y.o. female.  HPI Patient presents from home.  Reportedly brought in because patient at times would shake.  Does have history of sleep apnea.  Patient states sometimes she will be talking with people tell her she shakes little.  Patient sounds if she stays awake for this.  Recently in the hospital and discharged 3 days ago.  Appears to been discharged on hospice with plans for symptom control.  However patient did not talk to hospice before coming in here.  States she is not really having shortness of breath but feels as if she is caring more fluid in her legs.  She has been on antibiotics for a urinary tract infection.  States that UTI is what made things worse. Discussed with hospice nurse.  [name] is familiar with her.  States she will come by and see her.  [name] states that patient does not seem to understand what hospice is and continues to want aggressive measures but still be on hospice.  We will get some basic blood work here.  Patient reportedly needs dialysis but is too morbidly obese to be a candidate.    Past Medical History:  Diagnosis Date  . Arthritis    "hands, arms, feet, back, knees" (02/17/2018)  . Cardiomyopathy [facility])    From Dr. Bonney Roussel office notes from 01/13/18  . CHF (congestive heart failure) ([place])   . Chronic lower back pain   . Diabetic neuropathy ([place])   . Family history of adverse reaction to anesthesia    Mother is hard to arouse and blood pressure drops  . GERD (gastroesophageal reflux disease)   . High cholesterol   . Hypertension   . Sleep apnea    "need new machine"  - does not use a cpap (02/17/2018)  . Type II diabetes mellitus ([place])    no longer on medications (02/17/2018)    Patient Active Problem List   Diagnosis Date  Noted  . CHF (congestive heart failure) ([place]) 12/29/2019  . Urinary tract infection without hematuria   . Mixed hyperlipidemia   . Chronic kidney disease, stage IV (severe) ([place])   . Elevated troponin   . Chronic respiratory failure with hypoxia ([place])   . On home oxygen therapy   . Sepsis ([place]) 12/28/2019  . SIRS (systemic inflammatory response syndrome) ([place]) 12/28/2019  . Palliative care by specialist   . Goals of care, counseling/discussion   . Hyperkalemia 11/08/2019  . ARF (acute renal failure) ([place]) 11/07/2019  . Acute on chronic diastolic CHF (congestive heart failure) ([place]) 10/16/2019  . CKD (chronic kidney disease), stage III 10/16/2019  . Acute exacerbation of CHF (congestive heart failure) ([place]) 10/16/2019  . Acute on chronic systolic congestive heart failure ([place]) 09/11/2019  . AKI (acute kidney injury) ([place]) 09/11/2019  . Chronic pain syndrome 09/11/2019  . Heart failure ([place]) 09/11/2019  . No-show for appointment 07/08/2019  . Chronic systolic heart failure ([place]) 04/26/2019  . S/P revision of total knee, right 05/14/2018  . Dislocated knee, right, sequela   . S/P revision of total knee 02/17/2018  . Failed total right knee replacement ([place])   . Chronic renal insufficiency, stage 2 (mild) 11/10/2017  . Acute CHF (congestive heart failure) ([place]) 11/10/2017  . Failed total knee arthroplasty, sequela  08/31/2017  . Lower extremity cellulitis 01/03/2013  . Diabetes mellitus ([place]) 01/03/2013  . Morbid obesity ([place]) 11/12/2006  . TOBACCO DEPENDENCE 11/12/2006  . Depression 11/12/2006  . Essential hypertension 11/12/2006  . GASTROESOPHAGEAL REFLUX, NO ESOPHAGITIS 11/12/2006  . ACNE 11/12/2006  . OSTEOARTHRITIS, LOWER LEG 11/12/2006  . OSA (obstructive sleep apnea) 11/12/2006    Past Surgical History:  Procedure Laterality Date  . ANTERIOR CERVICAL DECOMP/DISCECTOMY FUSION  03/06/2010   C4-7 w/corpectomy/notes 03/17/2010  . BACK SURGERY    . CARPAL TUNNEL RELEASE  Right   . HARDWARE REVISION  04/10/2010   cervical hardware revision screw replacement C4/notes 04/12/2010  . JOINT REPLACEMENT    . LAPAROSCOPIC CHOLECYSTECTOMY  1999  . LUMBAR LAMINECTOMY/DECOMPRESSION MICRODISCECTOMY  09/2010   Archie Endo 10/05/2010  . MULTIPLE EXTRACTIONS WITH ALVEOLOPLASTY [name] 01/19/2015   Procedure: MULTIPLE EXTRACTIONS Three, Four, Five, Seven, eight, nine, ten, eleven, twelve, fourteen, eighteen, twenty-one, twenty-eight, twenty-nine, thirty-one with Alveoloplasty.  ;  Surgeon: Diona Browner, DDS;  Location: [facility];  Service: Oral Surgery;  Laterality: [name];  . REPLACEMENT TOTAL KNEE BILATERAL Bilateral 10/2007-03/2008   "left-right"  . TOENAIL EXCISION Bilateral    great toes  . TONSILLECTOMY    . TOTAL KNEE REVISION Right 02/17/2018  . TOTAL KNEE REVISION Right 02/17/2018   Procedure: RIGHT TOTAL KNEE REVISION;  Surgeon: Newt Minion, MD;  Location: [facility];  Service: Orthopedics;  Laterality: Right;  . TOTAL KNEE REVISION Right 05/14/2018   Procedure: RIGHT TOTAL KNEE REVISION TO HINGED KNEE;  Surgeon: Newt Minion, MD;  Location: [facility];  Service: Orthopedics;  Laterality: Right;     OB History   No obstetric history on file.     Family History  Problem Relation Age of Onset  . Kidney disease Mother   . Congestive Heart Failure Mother   . Hypertension Father     Social History   Tobacco Use  . Smoking status: Current Every Day Smoker    Packs/day: 0.50    Years: 32.00    Pack years: 16.00    Types: Cigarettes  . Smokeless tobacco: Never Used  Substance Use Topics  . Alcohol use: Not Currently  . Drug use: Not Currently    Types: Marijuana    Home Medications Prior to Admission medications   Medication Sig Start Date End Date Taking? Authorizing Provider  albuterol (PROVENTIL) (2.5 MG/3ML) 0.083% nebulizer solution Take 2.5 mg by nebulization every 4 (four) hours as needed for wheezing.  08/22/19  Yes [provider]  allopurinol (ZYLOPRIM) 300  MG tablet Take 300 mg by mouth daily. 12/22/19  Yes [provider]  ALPRAZolam (XANAX) 0.25 MG tablet Take 0.25 mg by mouth 2 (two) times daily as needed for anxiety.  12/19/19  Yes [provider]  aspirin 325 MG tablet Take 325 mg by mouth daily.   Yes [provider]  Baclofen 5 MG TABS Take 1 tablet by mouth 3 (three) times daily as needed for muscle spasms. 12/14/19  Yes [provider]  ciprofloxacin (CIPRO) 250 MG tablet Take 1 tablet (250 mg total) by mouth 2 (two) times daily for 4 days. 12/31/19 01/04/20 Yes Domenic Polite, MD  furosemide (LASIX) 40 MG tablet Take 1 tablet (40 mg total) by mouth 2 (two) times daily. 09/19/19 09/18/20 Yes Vann, Jessica U, DO  isosorbide-hydrALAZINE (BIDIL) 20-37.5 MG tablet Take 1 tablet by mouth 2 times daily at 12 noon and 4 pm. 11/19/19  Yes Domenic Polite, MD  metoprolol succinate (  TOPROL-XL) 25 MG 24 hr tablet TAKE 1 TABLET(25 MG) BY MOUTH DAILY WITH OR IMMEDIATELY FOLLOWING A MEAL Patient taking differently: Take 25 mg by mouth daily.  12/19/19  Yes Miquel Dunn, NP  oxyCODONE-acetaminophen (PERCOCET) 10-325 MG tablet Take 1 tablet by mouth every 8 (eight) hours as needed for pain. 12/31/19  Yes Domenic Polite, MD  OZEMPIC, 0.25 OR 0.5 MG/DOSE, 2 MG/1.5ML SOPN Inject 0.25 mg into the skin every Wednesday. 10/26/19  Yes [provider]  polyethylene glycol (MIRALAX / GLYCOLAX) 17 g packet Take 17 g by mouth daily as needed for mild constipation.   Yes [provider]  sodium chloride (OCEAN) 0.65 % SOLN nasal spray Place 1 spray into both nostrils as needed for congestion.   Yes [provider]  senna-docusate (SENOKOT-S) 8.6-50 MG tablet Take 2 tablets by mouth at bedtime as needed for mild constipation or moderate constipation. Patient not taking: Reported on 12/18/2019 11/19/19   Domenic Polite, MD    Allergies    Patient has no known allergies.  Review of Systems   Review of Systems    Constitutional: Negative for chills.  HENT: Negative for congestion.   Cardiovascular: Positive for leg swelling.  Gastrointestinal: Negative for abdominal distention.  Genitourinary: Negative for flank pain.  Musculoskeletal: Negative for back pain.  Skin: Negative for rash.  Neurological: Positive for tremors.  Hematological: Negative for adenopathy.  Psychiatric/Behavioral: Negative for confusion.    Physical Exam Updated Vital Signs BP (!) 129/111   Pulse 88   Temp 97.9 F (36.6 C) (Oral)   Resp 18   LMP 11/09/2012   SpO2 94%   Physical Exam Vitals reviewed.  HENT:     Head: Normocephalic.  Eyes:     Pupils: Pupils are equal, round, and reactive to light.  Cardiovascular:     Rate and Rhythm: Regular rhythm.  Pulmonary:     Effort: No respiratory distress.     Comments: Difficult exam due to body habitus. Abdominal:     Tenderness: There is no abdominal tenderness.  Musculoskeletal:     Cervical back: Neck supple.     Comments: Pitting edema with chronic venous changes of bilateral lower extremities.  Skin:    General: Skin is warm.  Neurological:     Mental Status: She is alert and oriented to person, place, and time. Mental status is at baseline.     ED Results / Procedures / Treatments   Labs (all labs ordered are listed, but only abnormal results are displayed) Labs Reviewed  CBC WITH DIFFERENTIAL/PLATELET - Abnormal; Notable for the following components:      Result Value   RBC 3.17 (*)    Hemoglobin 8.9 (*)    HCT 29.6 (*)    RDW 22.9 (*)    All other components within normal limits  COMPREHENSIVE METABOLIC PANEL - Abnormal; Notable for the following components:   Glucose, Bld 175 (*)    BUN 81 (*)    Creatinine, Ser 2.98 (*)    Total Protein 8.3 (*)    Albumin 2.9 (*)    Total Bilirubin 1.7 (*)    GFR calc non Af Amer 17 (*)    GFR calc Af Amer 20 (*)    All other components within normal limits    EKG None  Radiology No results  found.  Procedures Procedures (including critical care time)  Medications Ordered in ED Medications - No data to display  ED Course  I have reviewed  the triage vital signs and the nursing notes.  Pertinent labs & imaging results that were available during my care of the patient were reviewed by me and considered in my medical decision making (see chart for details).    MDM Rules/Calculators/A&P                      Patient presented with reported shaking at times.  Also question of decreased breathing at times.  Patient states her breathing is fine.  States she does feel she is carrying some extra fluid.  Hospice is going to come help evaluate patient.  Lab work reassuring.  Does have anemia but appears to be close to her baseline.  Kidney function also near discharge level.  Care turned over to Dr. Roderic Palau. Final Clinical Impression(s) / ED Diagnoses Final diagnoses:  None    Rx / DC Orders ED Discharge Orders    None       Davonna Belling, MD 01/03/20 1452

## 2020-01-03 NOTE — ED Provider Notes (Signed)
Patient seen by the hospice nurse and they agree she can be discharged home with continued hospice care.  She will be evaluated again tomorrow at home by hospice care   Milton Ferguson, MD 01/03/20 1623

## 2020-01-03 NOTE — Discharge Instructions (Addendum)
Continue with hospice care 

## 2020-01-03 NOTE — ED Triage Notes (Addendum)
PTAR reports daughter called for sleep apnea type symptoms, daughter states Pt pauses/skips in breathing while sleeping. Pt Dx with UTI a week ago and being treated currently. Pt states she has had sleep apnea over a year and uses CPAP.  BP 138 HR 90 RR 20 Sp02 95 CBG 98 Temp 98.7

## 2020-01-06 DIAGNOSIS — I509 Heart failure, unspecified: Secondary | ICD-10-CM | POA: Diagnosis not present

## 2020-01-06 DIAGNOSIS — N186 End stage renal disease: Secondary | ICD-10-CM | POA: Diagnosis not present

## 2020-01-06 DIAGNOSIS — Z758 Other problems related to medical facilities and other health care: Secondary | ICD-10-CM | POA: Diagnosis not present

## 2020-01-20 DIAGNOSIS — J42 Unspecified chronic bronchitis: Secondary | ICD-10-CM | POA: Diagnosis not present

## 2020-01-22 ENCOUNTER — Other Ambulatory Visit: Payer: Self-pay | Admitting: Cardiology

## 2020-01-23 DIAGNOSIS — R4020 Unspecified coma: Secondary | ICD-10-CM | POA: Diagnosis not present

## 2020-01-23 DIAGNOSIS — E1165 Type 2 diabetes mellitus with hyperglycemia: Secondary | ICD-10-CM | POA: Diagnosis not present

## 2020-01-23 DIAGNOSIS — Z743 Need for continuous supervision: Secondary | ICD-10-CM | POA: Diagnosis not present

## 2020-01-23 DIAGNOSIS — I499 Cardiac arrhythmia, unspecified: Secondary | ICD-10-CM | POA: Diagnosis not present

## 2020-01-23 DIAGNOSIS — R404 Transient alteration of awareness: Secondary | ICD-10-CM | POA: Diagnosis not present

## 2020-02-14 DEATH — deceased

## 2020-06-21 IMAGING — CR DG ANKLE COMPLETE 3+V*R*
3 series · 3 of 3 positions shown · non-contrast
Comparison: Right foot 03/04/2011

CLINICAL DATA: Injury to the right foot yesterday. Swelling and
decreased pulses. Recent surgery to the right leg in a knee
immobilizer.

EXAM:
RIGHT ANKLE - COMPLETE 3+ VIEW; RIGHT FOOT COMPLETE - 3+ VIEW

[ankle ap]
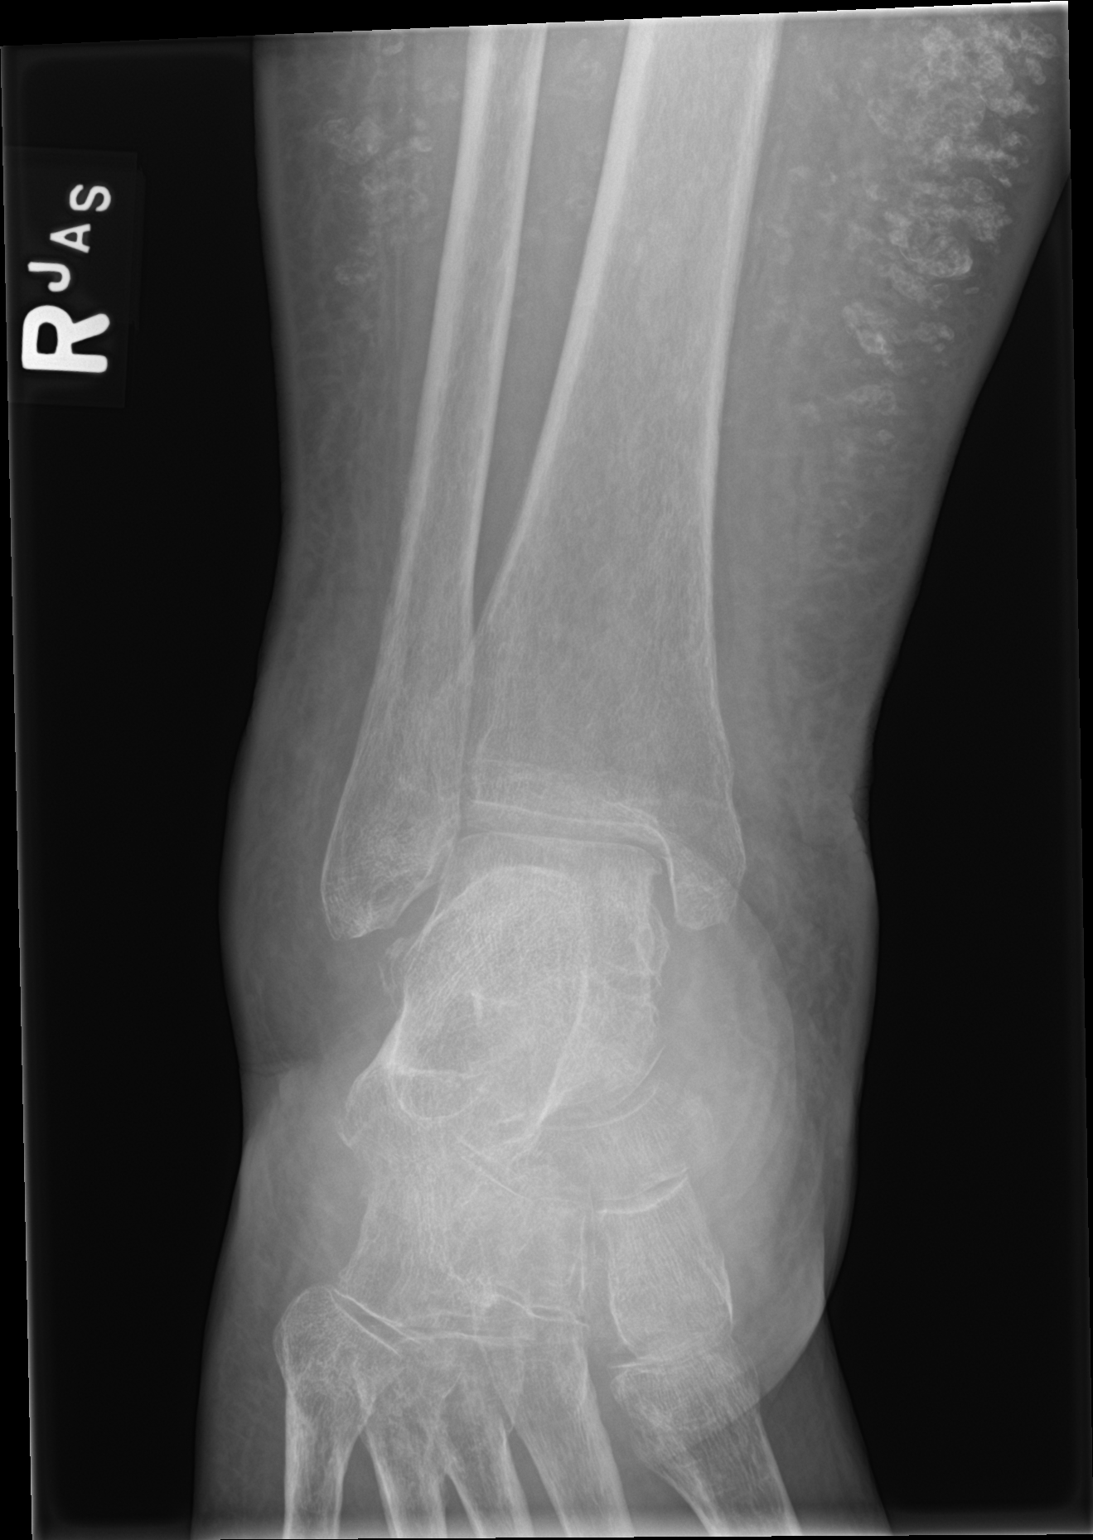

[ankle obl]
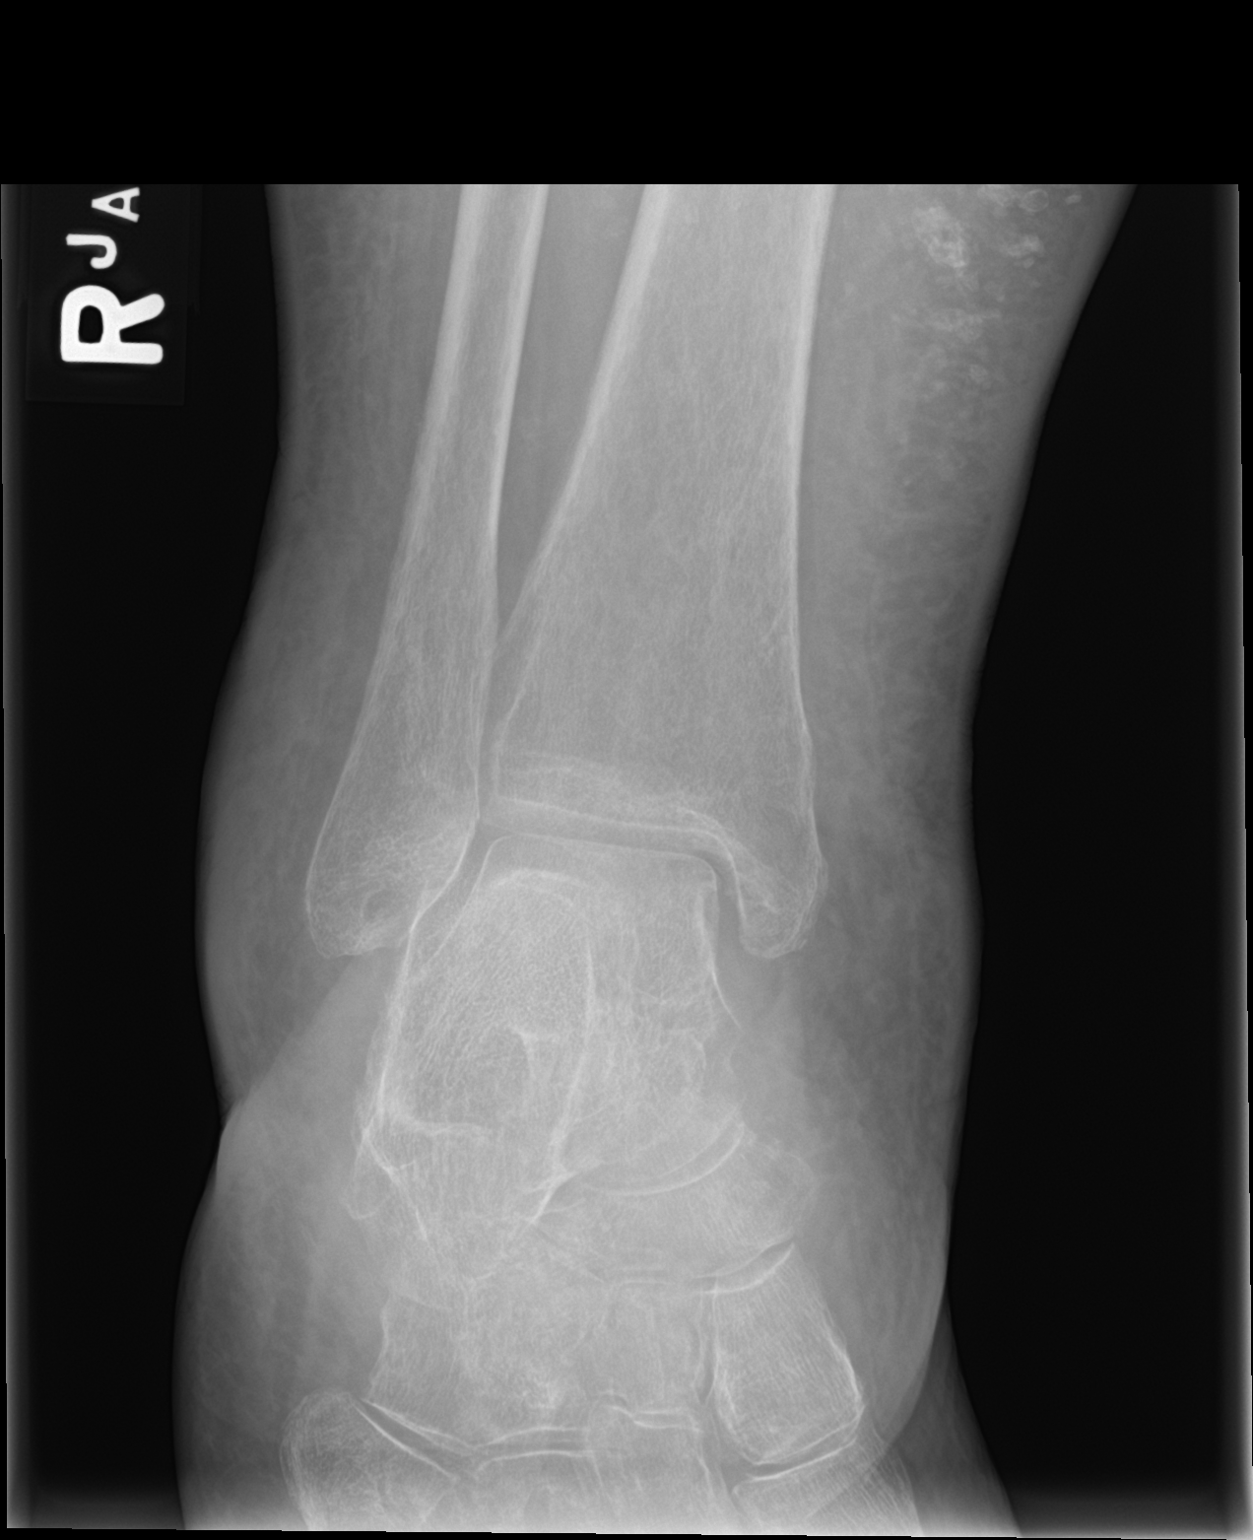

[ankle lat]
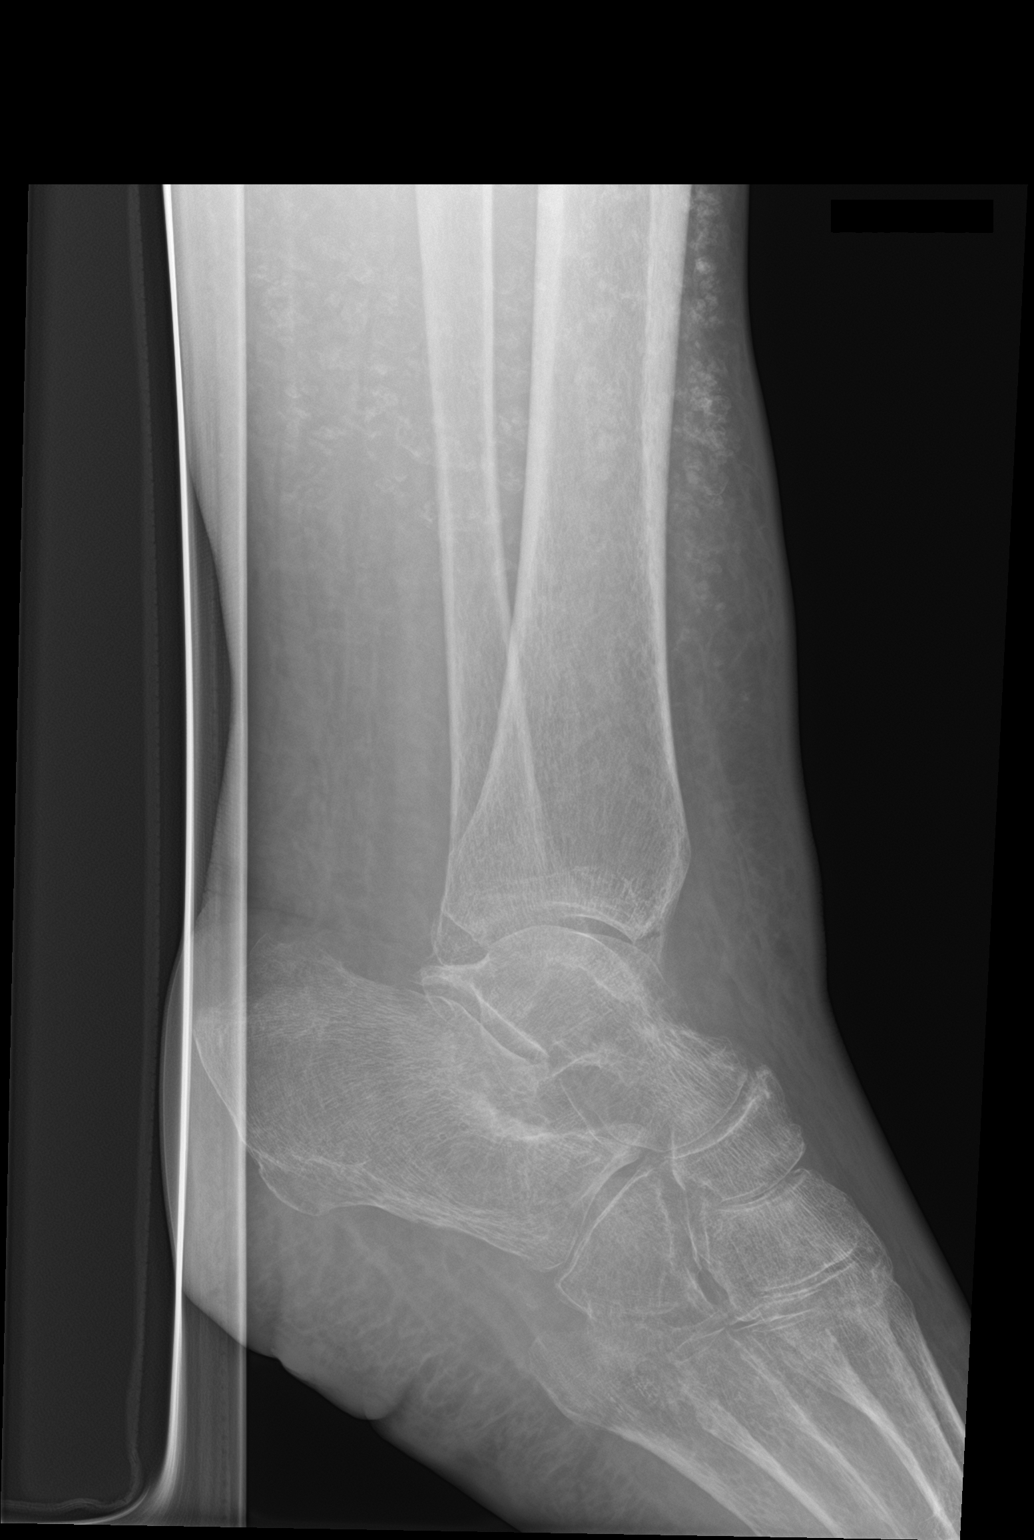

[3 of 3 positions shown; findings below may reference images not displayed]

FINDINGS: Three views of the right foot and three views of the right ankle are
obtained. There is diffuse bone demineralization. Mild hallux valgus
deformity. Degenerative changes in the intertarsal joints and in the
ankle joint. Visualization is limited due to an overlying pad, but
there is suggestion of fragmentation of the superior proximal
calcaneus which could represent nondisplaced fracture. Calcaneal
views are suggested. Otherwise, no acute fractures or dislocation
identified. Diffuse soft tissue swelling of the right lower leg with
diffuse soft tissue calcifications, likely due to venous stasis and
dystrophic calcification.
IMPRESSION: Degenerative changes in the right foot and ankle. Possible
nondisplaced fracture of the superior calcaneus, incompletely
evaluated due to artifact. Soft tissue swelling and calcification.

## 2021-07-15 IMAGING — CT CT HEAD W/O CM
4 series · 16 of 47 positions shown, 18 images · non-contrast
Comparison: CT of the head from 12/31/2018, cervical CT from
10/22/2012

CLINICAL DATA: Recent fall with headaches and neck pain, initial
encounter

EXAM:
CT HEAD WITHOUT CONTRAST
CT CERVICAL SPINE WITHOUT CONTRAST
TECHNIQUE: Multidetector CT imaging of the head and cervical spine was
performed following the standard protocol without intravenous
contrast. Multiplanar CT image reconstructions of the cervical spine
were also generated.

[Series 3: head without · axial · non-contrast · 0.46mm/px · z∈[-224,-104]mm · 7 of 34 slices shown, 9 images]
[im 5/34  brain]
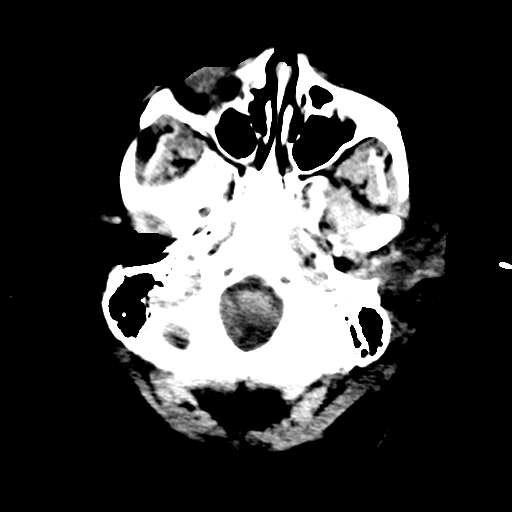
[im 5/34  bone]
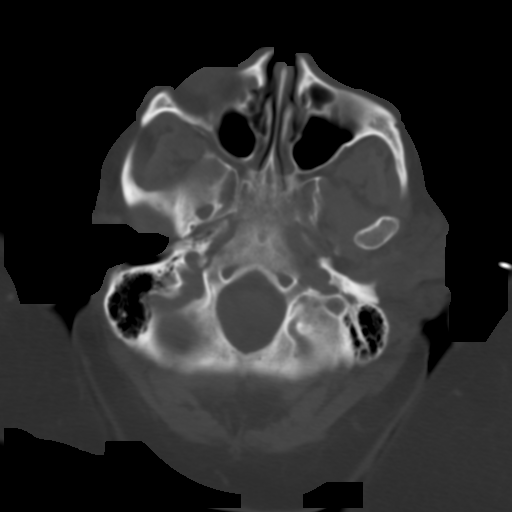
[im 9/34  brain]
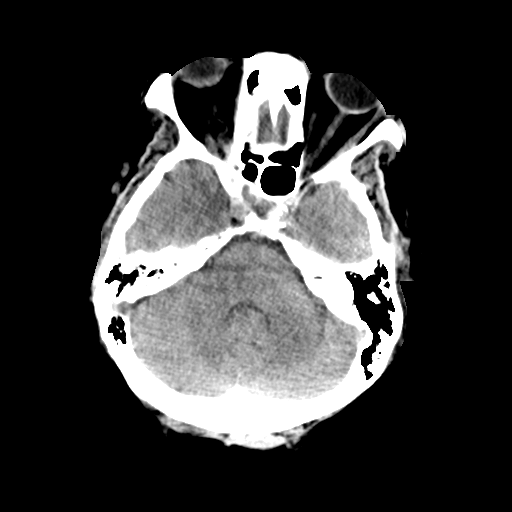
[im 13/34  brain]
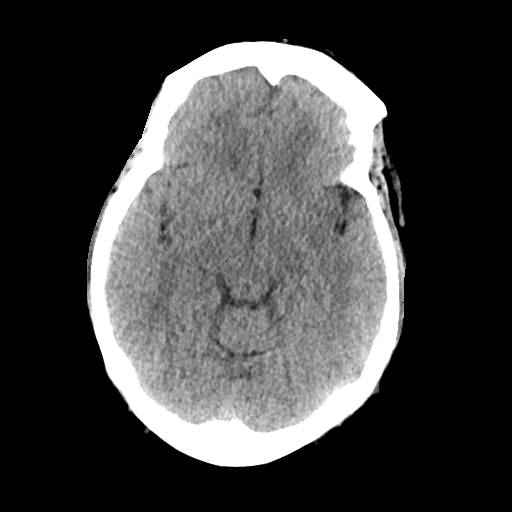
[im 17/34  brain]
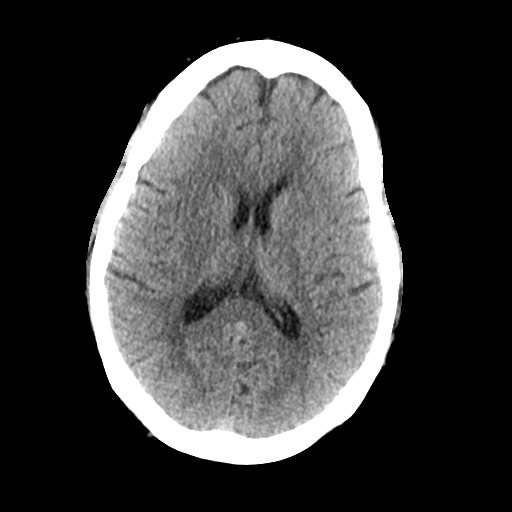
[im 21/34  brain]
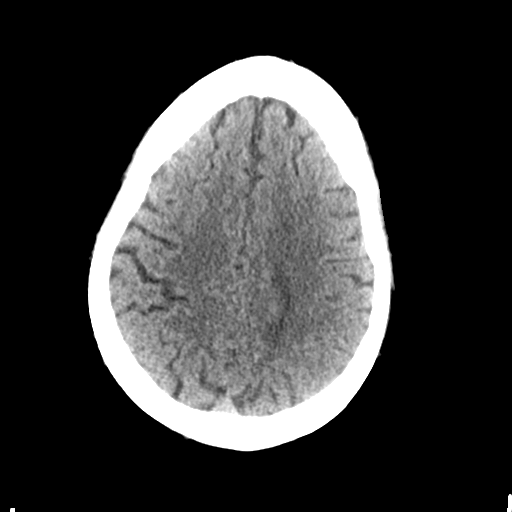
[im 21/34  bone]
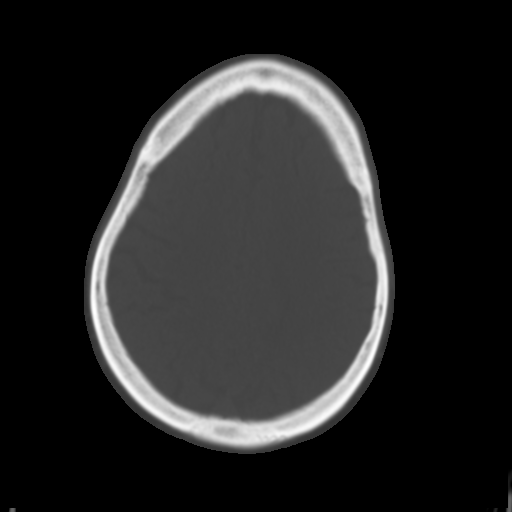
[im 25/34  brain]
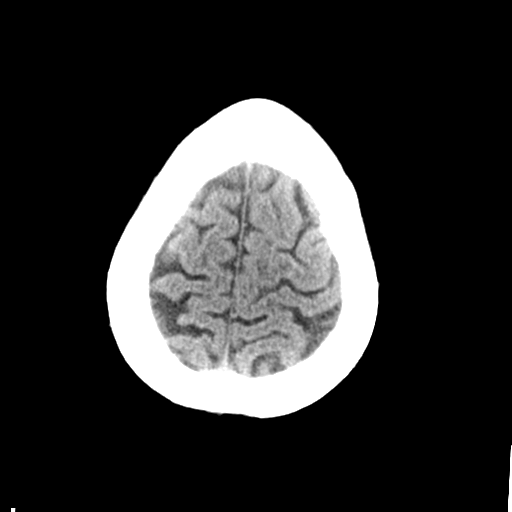
[im 29/34  brain]
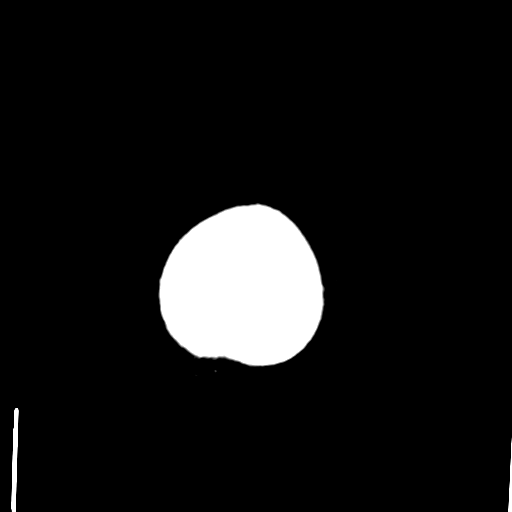

[Series 4: head bone · axial · 0.46mm/px · z∈[-228,-196]mm · 3 of 84 slices shown]
[im 9/84  bone]
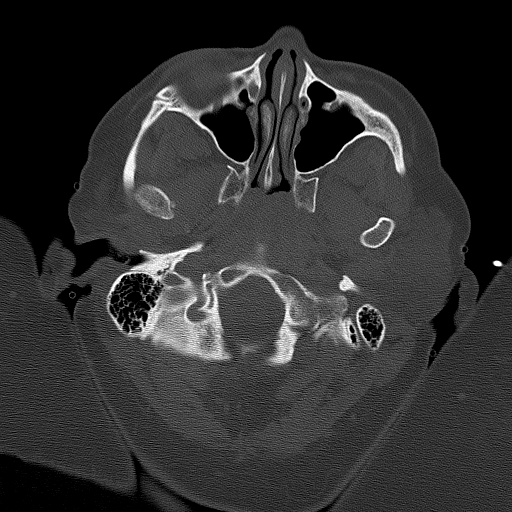
[im 17/84  bone]
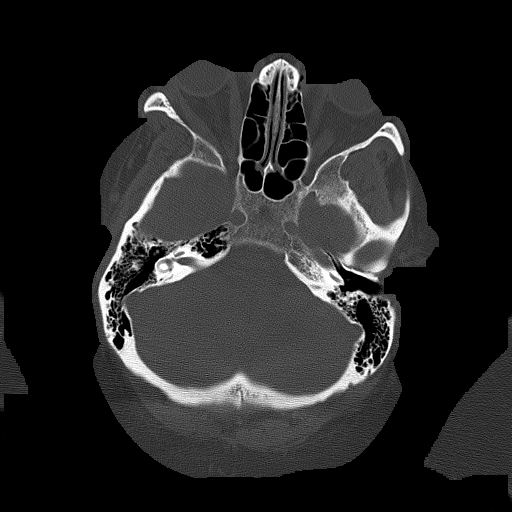
[im 25/84  bone]
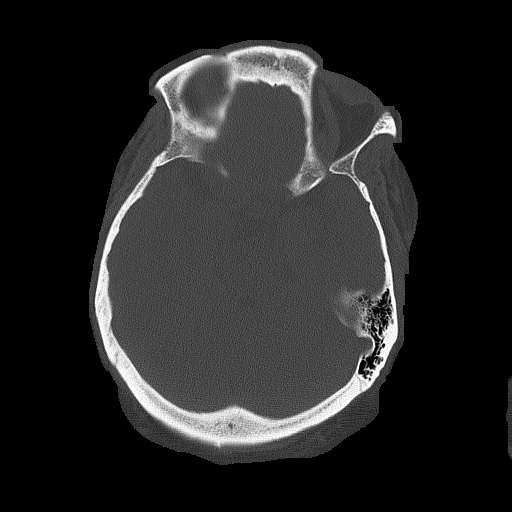

[Series 5: head without cor · coronal · non-contrast · 0.34mm/px · 3 of 73 slices shown]
[im 25/73  brain]
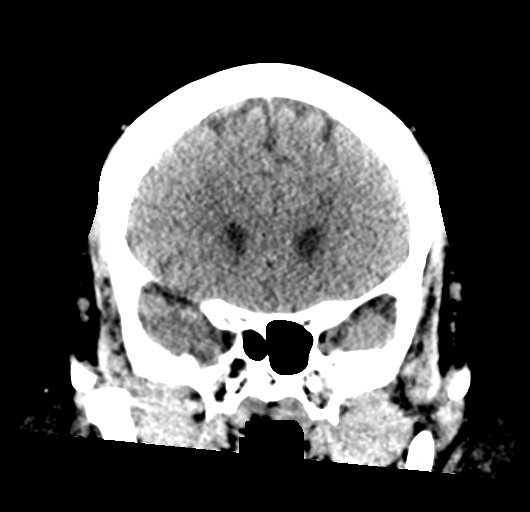
[im 33/73  brain]
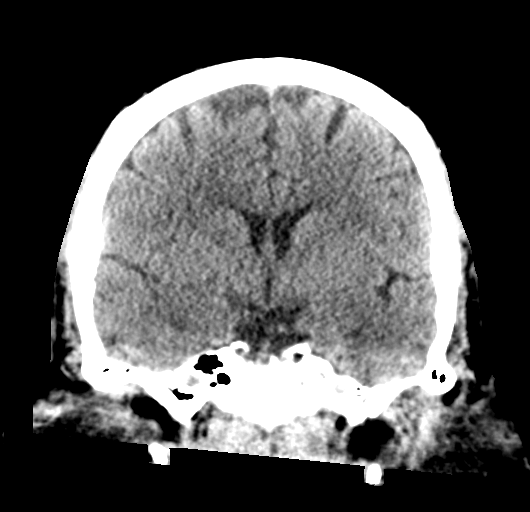
[im 41/73  brain]
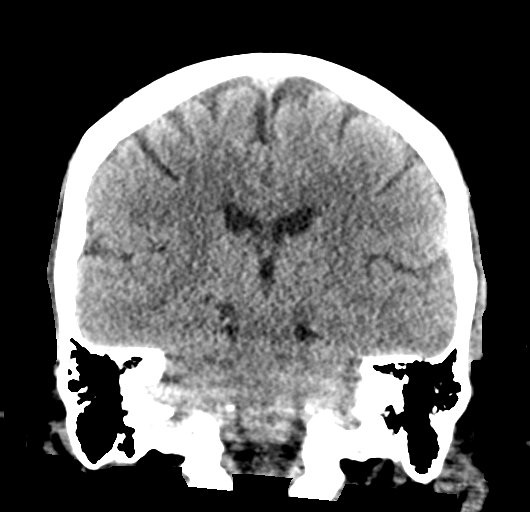

[Series 6: head without sag · sagittal · non-contrast · 0.34mm/px · 3 of 67 slices shown]
[im 23/67  brain]
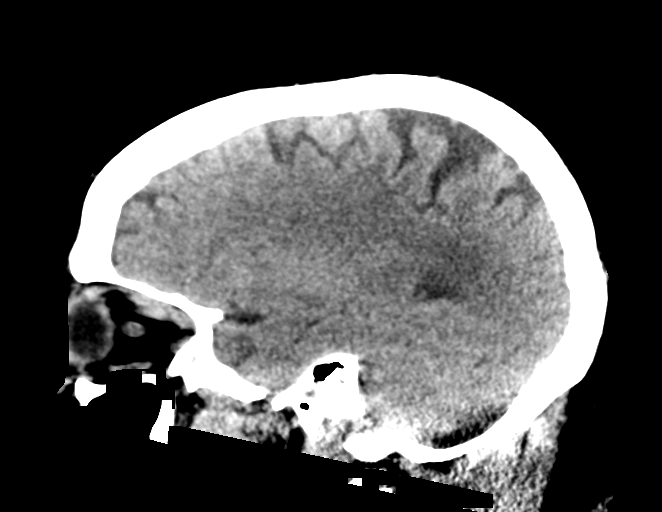
[im 34/67  brain]
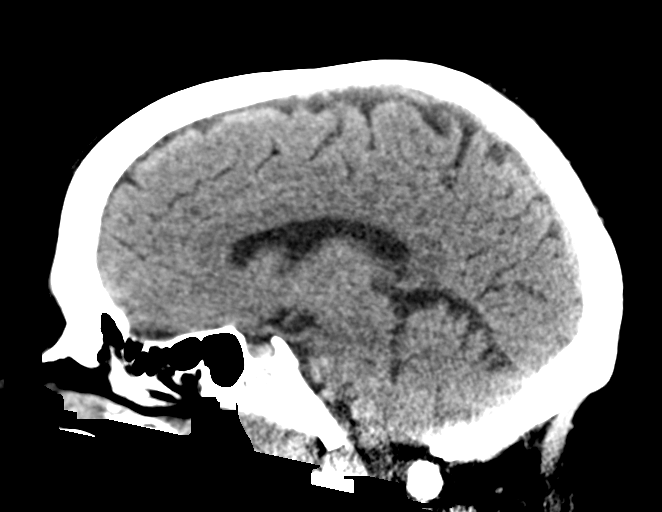
[im 45/67  brain]
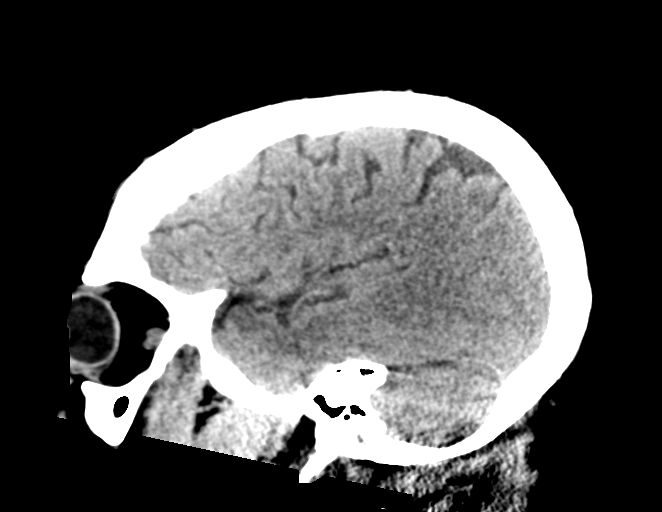

[16 of 47 positions shown; findings below may reference images not displayed]

FINDINGS: CT HEAD FINDINGS

Brain: No evidence of acute infarction, hemorrhage, hydrocephalus,
extra-axial collection or mass lesion/mass effect.

Vascular: No hyperdense vessel or unexpected calcification.

Skull: Normal. Negative for fracture or focal lesion.

Sinuses/Orbits: No acute finding.

Other: None.

CT CERVICAL SPINE FINDINGS

Alignment: Within normal limits.

Skull base and vertebrae: 7 cervical segments are visualized. There
are changes consistent with prior corpectomy/fusion at C5 and C6
with anterior fusion extending from C4-C7. These changes are stable
from the prior exam. Anterior osteophytes are noted at C2-3 and
C3-4. Facet hypertrophic changes are noted. No acute fracture or
acute facet abnormality is seen.

Soft tissues and spinal canal: Surrounding soft tissue structures
appear within normal limits.

Upper chest: Visualized lung apices are within normal limits.

Other: None
IMPRESSION: CT of the head: No acute intracranial abnormality noted.

CT of the cervical spine: Postsurgical changes from C4-C7.
Degenerative changes are noted. No acute abnormality is seen.
